# Patient Record
Sex: Male | Born: 1985 | Race: White | Hispanic: No | Marital: Single | Smoking: Former smoker
Health system: Southern US, Community
[De-identification: ages and names within clinical notes are randomized; demographics above are authoritative.]

## PROBLEM LIST (undated history)

## (undated) DIAGNOSIS — F32A Depression, unspecified: Secondary | ICD-10-CM

## (undated) DIAGNOSIS — F329 Major depressive disorder, single episode, unspecified: Secondary | ICD-10-CM

## (undated) DIAGNOSIS — K7460 Unspecified cirrhosis of liver: Secondary | ICD-10-CM

## (undated) DIAGNOSIS — M869 Osteomyelitis, unspecified: Secondary | ICD-10-CM

## (undated) DIAGNOSIS — J45909 Unspecified asthma, uncomplicated: Secondary | ICD-10-CM

## (undated) DIAGNOSIS — I10 Essential (primary) hypertension: Secondary | ICD-10-CM

## (undated) DIAGNOSIS — F1010 Alcohol abuse, uncomplicated: Secondary | ICD-10-CM

## (undated) HISTORY — DX: Unspecified cirrhosis of liver: K74.60

## (undated) HISTORY — DX: Osteomyelitis, unspecified: M86.9

## (undated) HISTORY — PX: FRACTURE SURGERY: SHX138

---
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
## 2004-09-19 ENCOUNTER — Ambulatory Visit: Payer: Self-pay | Admitting: Family Medicine

## 2005-09-22 ENCOUNTER — Emergency Department (HOSPITAL_COMMUNITY): Admission: AD | Admit: 2005-09-22 | Discharge: 2005-09-22 | Payer: Self-pay | Admitting: Family Medicine

## 2007-03-28 DIAGNOSIS — J309 Allergic rhinitis, unspecified: Secondary | ICD-10-CM | POA: Insufficient documentation

## 2010-06-24 ENCOUNTER — Emergency Department (HOSPITAL_COMMUNITY)
Admission: EM | Admit: 2010-06-24 | Discharge: 2010-06-24 | Disposition: A | Discharge status: Other Acute Inpatient Hospital | Payer: Self-pay | Source: Home / Self Care | Admitting: Emergency Medicine

## 2010-06-24 ENCOUNTER — Ambulatory Visit: Payer: Self-pay | Admitting: Psychiatry

## 2010-06-24 ENCOUNTER — Inpatient Hospital Stay (HOSPITAL_COMMUNITY)
Admission: AD | Admit: 2010-06-24 | Discharge: 2010-06-28 | Payer: Self-pay | Source: Other Acute Inpatient Hospital | Attending: Psychiatry | Admitting: Psychiatry

## 2010-09-09 ENCOUNTER — Emergency Department (HOSPITAL_COMMUNITY)
Admission: EM | Admit: 2010-09-09 | Discharge: 2010-09-09 | Disposition: A | Discharge status: Home / Self Care | Payer: Managed Care, Other (non HMO) | Attending: Emergency Medicine | Admitting: Emergency Medicine

## 2010-09-09 DIAGNOSIS — H5710 Ocular pain, unspecified eye: Secondary | ICD-10-CM | POA: Insufficient documentation

## 2010-09-09 DIAGNOSIS — T1500XA Foreign body in cornea, unspecified eye, initial encounter: Secondary | ICD-10-CM | POA: Insufficient documentation

## 2010-09-09 DIAGNOSIS — J45909 Unspecified asthma, uncomplicated: Secondary | ICD-10-CM | POA: Insufficient documentation

## 2010-09-09 DIAGNOSIS — T1590XA Foreign body on external eye, part unspecified, unspecified eye, initial encounter: Secondary | ICD-10-CM | POA: Insufficient documentation

## 2010-10-03 LAB — DIFFERENTIAL
Basophils Absolute: 0 10*3/uL (ref 0.0–0.1)
Basophils Relative: 0 % (ref 0–1)
Eosinophils Absolute: 0.1 10*3/uL (ref 0.0–0.7)
Eosinophils Relative: 1 % (ref 0–5)
Lymphocytes Relative: 31 % (ref 12–46)
Lymphs Abs: 2.5 10*3/uL (ref 0.7–4.0)
Monocytes Absolute: 0.4 10*3/uL (ref 0.1–1.0)
Monocytes Relative: 5 % (ref 3–12)
Neutro Abs: 5.1 10*3/uL (ref 1.7–7.7)
Neutrophils Relative %: 63 % (ref 43–77)

## 2010-10-03 LAB — RAPID URINE DRUG SCREEN, HOSP PERFORMED
Amphetamines: NOT DETECTED
Benzodiazepines: NOT DETECTED
Tetrahydrocannabinol: NOT DETECTED

## 2010-10-03 LAB — COMPREHENSIVE METABOLIC PANEL
ALT: 39 U/L (ref 0–53)
AST: 31 U/L (ref 0–37)
Albumin: 4.5 g/dL (ref 3.5–5.2)
Alkaline Phosphatase: 81 U/L (ref 39–117)
BUN: 8 mg/dL (ref 6–23)
CO2: 27 mEq/L (ref 19–32)
Calcium: 9.6 mg/dL (ref 8.4–10.5)
Chloride: 104 mEq/L (ref 96–112)
Creatinine, Ser: 0.9 mg/dL (ref 0.4–1.5)
GFR calc Af Amer: >60 mL/min (ref >60)
GFR calc non Af Amer: >60 mL/min (ref >60)
Glucose, Bld: 131 mg/dL — ABNORMAL HIGH (ref 70–99)
Potassium: 3.9 mEq/L (ref 3.5–5.1)
Sodium: 140 mEq/L (ref 135–145)
Total Bilirubin: 0.3 mg/dL (ref 0.3–1.2)
Total Protein: 8.2 g/dL (ref 6.0–8.3)

## 2010-10-03 LAB — CBC
HCT: 46.5 % (ref 39.0–52.0)
Hemoglobin: 16.6 g/dL (ref 13.0–17.0)
MCH: 31.3 pg (ref 26.0–34.0)
MCHC: 35.7 g/dL (ref 30.0–36.0)
MCV: 87.6 fL (ref 78.0–100.0)
Platelets: 221 10*3/uL (ref 150–400)
RBC: 5.31 MIL/uL (ref 4.22–5.81)
RDW: 11.8 % (ref 11.5–15.5)
WBC: 8.2 10*3/uL (ref 4.0–10.5)

## 2010-10-03 LAB — ETHANOL: Alcohol, Ethyl (B): 257 mg/dL — ABNORMAL HIGH (ref 0–10)

## 2011-07-18 ENCOUNTER — Encounter: Payer: Self-pay | Admitting: Emergency Medicine

## 2011-07-18 ENCOUNTER — Inpatient Hospital Stay (HOSPITAL_COMMUNITY)
Admission: AD | Admit: 2011-07-18 | Discharge: 2011-07-20 | DRG: 897 | Disposition: A | Discharge status: Home / Self Care | Payer: Managed Care, Other (non HMO) | Source: Ambulatory Visit | Attending: Psychiatry | Admitting: Psychiatry

## 2011-07-18 ENCOUNTER — Emergency Department (HOSPITAL_COMMUNITY)
Admission: EM | Admit: 2011-07-18 | Discharge: 2011-07-18 | Disposition: A | Discharge status: Other Acute Inpatient Hospital | Payer: Managed Care, Other (non HMO) | Attending: Emergency Medicine | Admitting: Emergency Medicine

## 2011-07-18 ENCOUNTER — Encounter (HOSPITAL_COMMUNITY): Payer: Self-pay | Admitting: Psychiatry

## 2011-07-18 DIAGNOSIS — F1910 Other psychoactive substance abuse, uncomplicated: Secondary | ICD-10-CM | POA: Insufficient documentation

## 2011-07-18 DIAGNOSIS — F1010 Alcohol abuse, uncomplicated: Secondary | ICD-10-CM | POA: Insufficient documentation

## 2011-07-18 DIAGNOSIS — R000 Tachycardia, unspecified: Secondary | ICD-10-CM | POA: Insufficient documentation

## 2011-07-18 DIAGNOSIS — F3289 Other specified depressive episodes: Secondary | ICD-10-CM

## 2011-07-18 DIAGNOSIS — F1020 Alcohol dependence, uncomplicated: Principal | ICD-10-CM

## 2011-07-18 DIAGNOSIS — F329 Major depressive disorder, single episode, unspecified: Secondary | ICD-10-CM

## 2011-07-18 DIAGNOSIS — Z79899 Other long term (current) drug therapy: Secondary | ICD-10-CM

## 2011-07-18 HISTORY — DX: Depression, unspecified: F32.A

## 2011-07-18 HISTORY — DX: Alcohol abuse, uncomplicated: F10.10

## 2011-07-18 HISTORY — DX: Major depressive disorder, single episode, unspecified: F32.9

## 2011-07-18 LAB — ETHANOL: Alcohol, Ethyl (B): 254 mg/dL — ABNORMAL HIGH (ref 0–11)

## 2011-07-18 LAB — RAPID URINE DRUG SCREEN, HOSP PERFORMED
Amphetamines: NOT DETECTED
Benzodiazepines: NOT DETECTED
Opiates: NOT DETECTED

## 2011-07-18 LAB — DIFFERENTIAL
Lymphocytes Relative: 37 % (ref 12–46)
Lymphs Abs: 2.3 10*3/uL (ref 0.7–4.0)
Neutro Abs: 3.3 10*3/uL (ref 1.7–7.7)
Neutrophils Relative %: 54 % (ref 43–77)

## 2011-07-18 LAB — BASIC METABOLIC PANEL
CO2: 30 mEq/L (ref 19–32)
Glucose, Bld: 117 mg/dL — ABNORMAL HIGH (ref 70–99)
Potassium: 4.5 mEq/L (ref 3.5–5.1)
Sodium: 138 mEq/L (ref 135–145)

## 2011-07-18 LAB — URINALYSIS, ROUTINE W REFLEX MICROSCOPIC
Glucose, UA: NEGATIVE mg/dL
Leukocytes, UA: NEGATIVE
Nitrite: NEGATIVE
Protein, ur: 30 mg/dL — AB
pH: 7 (ref 5.0–8.0)

## 2011-07-18 LAB — URINE MICROSCOPIC-ADD ON

## 2011-07-18 LAB — CBC
MCV: 86.2 fL (ref 78.0–100.0)
Platelets: 211 10*3/uL (ref 150–400)
RBC: 5.36 MIL/uL (ref 4.22–5.81)
WBC: 6.2 10*3/uL (ref 4.0–10.5)

## 2011-07-18 MED ORDER — ACETAMINOPHEN 325 MG PO TABS: 650.0000 mg | ORAL_TABLET | ORAL | Status: DC | PRN

## 2011-07-18 MED ORDER — IBUPROFEN 600 MG PO TABS: 600.0000 mg | ORAL_TABLET | Freq: Three times a day (TID) | ORAL | Status: DC | PRN

## 2011-07-18 MED ORDER — CHLORDIAZEPOXIDE HCL 25 MG PO CAPS
25.0000 mg | ORAL_CAPSULE | ORAL | Status: DC
  Administered 2011-07-20: 25 mg via ORAL
  Filled 2011-07-18: qty 1

## 2011-07-18 MED ORDER — ONDANSETRON 4 MG PO TBDP: 4.0000 mg | ORAL_TABLET | Freq: Four times a day (QID) | ORAL | Status: DC | PRN

## 2011-07-18 MED ORDER — ALUM & MAG HYDROXIDE-SIMETH 200-200-20 MG/5ML PO SUSP: 30.0000 mL | ORAL | Status: DC | PRN

## 2011-07-18 MED ORDER — ADULT MULTIVITAMIN W/MINERALS CH
1.0000 | ORAL_TABLET | Freq: Every day | ORAL | Status: DC
  Administered 2011-07-18 – 2011-07-20 (×4): 1 via ORAL
  Filled 2011-07-18 (×3): qty 1

## 2011-07-18 MED ORDER — HYDROXYZINE HCL 25 MG PO TABS
25.0000 mg | ORAL_TABLET | Freq: Four times a day (QID) | ORAL | Status: DC | PRN
  Administered 2011-07-18: 25 mg via ORAL

## 2011-07-18 MED ORDER — LOPERAMIDE HCL 2 MG PO CAPS: 2.0000 mg | ORAL_CAPSULE | ORAL | Status: DC | PRN

## 2011-07-18 MED ORDER — ONDANSETRON HCL 4 MG PO TABS: 4.0000 mg | ORAL_TABLET | Freq: Three times a day (TID) | ORAL | Status: DC | PRN

## 2011-07-18 MED ORDER — MAGNESIUM HYDROXIDE 400 MG/5ML PO SUSP: 30.0000 mL | Freq: Every day | ORAL | Status: DC | PRN

## 2011-07-18 MED ORDER — LORAZEPAM 1 MG PO TABS: 1.0000 mg | ORAL_TABLET | ORAL | Status: DC | PRN

## 2011-07-18 MED ORDER — CHLORDIAZEPOXIDE HCL 25 MG PO CAPS
25.0000 mg | ORAL_CAPSULE | Freq: Four times a day (QID) | ORAL | Status: AC
  Administered 2011-07-18 (×2): 25 mg via ORAL
  Filled 2011-07-18 (×2): qty 1

## 2011-07-18 MED ORDER — ZOLPIDEM TARTRATE 5 MG PO TABS: 5.0000 mg | ORAL_TABLET | Freq: Every evening | ORAL | Status: DC | PRN

## 2011-07-18 MED ORDER — CHLORDIAZEPOXIDE HCL 25 MG PO CAPS: 25.0000 mg | ORAL_CAPSULE | Freq: Four times a day (QID) | ORAL | Status: DC | PRN

## 2011-07-18 MED ORDER — LORAZEPAM 1 MG PO TABS
1.0000 mg | ORAL_TABLET | Freq: Once | ORAL | Status: AC
  Administered 2011-07-18: 1 mg via ORAL
  Filled 2011-07-18: qty 1

## 2011-07-18 MED ORDER — CHLORDIAZEPOXIDE HCL 25 MG PO CAPS: 25.0000 mg | ORAL_CAPSULE | Freq: Every day | ORAL | Status: DC

## 2011-07-18 MED ORDER — CHLORDIAZEPOXIDE HCL 25 MG PO CAPS
25.0000 mg | ORAL_CAPSULE | Freq: Three times a day (TID) | ORAL | Status: AC
  Administered 2011-07-19 (×3): 25 mg via ORAL
  Filled 2011-07-18 (×3): qty 1

## 2011-07-18 MED ORDER — VITAMIN B-1 100 MG PO TABS
100.0000 mg | ORAL_TABLET | Freq: Every day | ORAL | Status: DC
  Administered 2011-07-19 – 2011-07-20 (×2): 100 mg via ORAL
  Filled 2011-07-18 (×4): qty 1

## 2011-07-18 MED ORDER — ACETAMINOPHEN 325 MG PO TABS
650.0000 mg | ORAL_TABLET | Freq: Four times a day (QID) | ORAL | Status: DC | PRN
  Administered 2011-07-19 – 2011-07-20 (×2): 650 mg via ORAL

## 2011-07-18 NOTE — ED Provider Notes (Addendum)
Pt resting comfortably. No tremor or shakes. Awaiting [facility] bed.   Act team indicates pt accepted to [facility], dr Rogers Blocker, bed ready. Pt resting comfortably. Hr 94 rr 16.   Suzi Roots, MD 07/18/11 [id number]  Suzi Roots, MD 07/18/11 [phone]

## 2011-07-18 NOTE — H&P (Signed)
Psychiatric Admission Assessment Adult  Patient Identification:  [name] Xxx-Hairfield Date of Evaluation:  07/18/2011 Chief Complaint:  ETOH DEP History of Present Illness:Pt. Is requesting detox from alcohol. Mood Symptoms:  None Depression Symptoms:  None (Hypo) Manic Symptoms:   Elevated Mood:  No Irritable Mood:  No Grandiosity:  No Distractibility:  No Labiality of Mood:  No Delusions:  No Hallucinations:  No Impulsivity:  No Sexually Inappropriate Behavior:  No Financial Extravagance:  No Flight of Ideas:  No  Anxiety Symptoms: Excessive Worry:  No Panic Symptoms:  No Agoraphobia:  No Obsessive Compulsive: No  Symptoms: None Specific Phobias:  No Social Anxiety:  No  Psychotic Symptoms:  Hallucinations:  None Delusions:  No Paranoia:  No   Ideas of Reference:  No  PTSD Symptoms: Ever had a traumatic exposure:  No Had a traumatic exposure in the last month:  No Re-experiencing:  None Hypervigilance:  No Hyperarousal:  None Avoidance:  None  Traumatic Brain Injury:  None per patient  Past Psychiatric History: 1 previous [facility] admission for detox Diagnosis: alcohol  abuse  Hospitalizations: see above  Outpatient Care: none  Substance Abuse Care: none  Self-Mutilation: none  Suicidal Attempts:none  Violent Behaviors:none   Past Medical History:   Past Medical History  Diagnosis Date  . Alcohol abuse   . Depression    History of Loss of Consciousness:  No Seizure History:  No Cardiac History:  No Allergies:  No Known Allergies Current Medications:  Current Facility-Administered Medications  Medication Dose Route Frequency Provider Last Rate Last Dose  . acetaminophen (TYLENOL) tablet 650 mg  650 mg Oral Q6H PRN Shamsher Alvie Heidelberg, MD      . alum & mag hydroxide-simeth (MAALOX/MYLANTA) 200-200-20 MG/5ML suspension 30 mL  30 mL Oral Q4H PRN Shamsher Alvie Heidelberg, MD      . chlordiazePOXIDE (LIBRIUM) capsule 25 mg  25 mg Oral Q6H PRN Shamsher Alvie Heidelberg, MD      . chlordiazePOXIDE (LIBRIUM) capsule 25 mg  25 mg Oral QID Katheren Puller, MD       Followed by  . chlordiazePOXIDE (LIBRIUM) capsule 25 mg  25 mg Oral TID Katheren Puller, MD       Followed by  . chlordiazePOXIDE (LIBRIUM) capsule 25 mg  25 mg Oral BH-qamhs Shamsher Alvie Heidelberg, MD       Followed by  . chlordiazePOXIDE (LIBRIUM) capsule 25 mg  25 mg Oral Daily Shamsher Alvie Heidelberg, MD      . hydrOXYzine (ATARAX/VISTARIL) tablet 25 mg  25 mg Oral Q6H PRN Shamsher Alvie Heidelberg, MD      . loperamide (IMODIUM) capsule 2-4 mg  2-4 mg Oral PRN Shamsher Alvie Heidelberg, MD      . magnesium hydroxide (MILK OF MAGNESIA) suspension 30 mL  30 mL Oral Daily PRN Shamsher Alvie Heidelberg, MD      . mulitivitamin with minerals tablet 1 tablet  1 tablet Oral Daily Shamsher S Ahluwalia, MD      . ondansetron (ZOFRAN-ODT) disintegrating tablet 4 mg  4 mg Oral Q6H PRN Shamsher Alvie Heidelberg, MD      . thiamine (VITAMIN B-1) tablet 100 mg  100 mg Oral Daily Shamsher Alvie Heidelberg, MD       Facility-Administered Medications Ordered in Other Encounters  Medication Dose Route Frequency Provider Last Rate Last Dose  . LORazepam (ATIVAN) tablet 1 mg  1 mg Oral Once Dione Booze, MD   1 mg at 07/18/11 0405  .  DISCONTD: acetaminophen (TYLENOL) tablet 650 mg  650 mg Oral Q4H PRN Dione Booze, MD      . DISCONTD: alum & mag hydroxide-simeth (MAALOX/MYLANTA) 200-200-20 MG/5ML suspension 30 mL  30 mL Oral PRN Dione Booze, MD      . DISCONTD: ibuprofen (ADVIL,MOTRIN) tablet 600 mg  600 mg Oral Q8H PRN Dione Booze, MD      . DISCONTD: LORazepam (ATIVAN) tablet 1 mg  1 mg Oral Q4H PRN Dione Booze, MD      . DISCONTD: ondansetron [facility]) tablet 4 mg  4 mg Oral Q8H PRN Dione Booze, MD      . DISCONTD: zolpidem (AMBIEN) tablet 5 mg  5 mg Oral QHS PRN Dione Booze, MD        Previous Psychotropic Medications:  Medication Dose                        Substance Abuse History in the last 12  months: Substance Age of 1st Use Last Use Amount Specific Type  Nicotine      Alcohol      Cannabis      Opiates      Cocaine      Methamphetamines      LSD      Ecstasy      Benzodiazepines      Caffeine      Inhalants      Others:                         Medical Consequences of Substance Abuse:  Legal Consequences of Substance Abuse:  Family Consequences of Substance Abuse:  Blackouts:  No DT's:  No Withdrawal Symptoms:  Headaches Nausea  Social History: Current Place of Residence:   Place of Birth:   Family Members: Marital Status:  Single Children:  Sons:  Daughters: Relationships: Education:  Corporate treasurer Problems/Performance: Religious Beliefs/Practices: History of Abuse (Emotional/Phsycial/Sexual) Occupational Experiences; Military History:   Legal History: Hobbies/Interests:  Family History:  History reviewed. No pertinent family history. ROS: Negative  PE: Done in ED and reviewed by this examiner. Mental Status Examination/Evaluation: Objective:  Appearance: Fairly Groomed  Patent attorney::  Good  Speech:  Clear and Coherent  Volume:  Normal  Mood:    Affect:  Congruent  Thought Process:  Linear  Orientation:  Full  Thought Content:  Hallucinations: None  Suicidal Thoughts:  No  Homicidal Thoughts:  No  Judgement:  Impaired  Insight:  Fair  Psychomotor Activity:  Normal  Akathisia:  No  Handed:  Right  AIMS (if indicated):     Assets:  Communication Skills Desire for Improvement Transportation    Laboratory/X-Ray Psychological Evaluation(s)      Assessment:  Axis I: Alcohol Abuse  AXIS I Alcohol Abuse  AXIS II No diagnosis  AXIS III Past Medical History  Diagnosis Date  . Alcohol abuse   . Depression      AXIS IV   AXIS V 51-60 moderate symptoms   Treatment Plan/Recommendations:  Treatment Plan Summary: Daily contact with patient to assess and evaluate symptoms and progress in treatment Medication  management  Observation Level/Precautions:  Detox  Laboratory:    Psychotherapy:    Medications:    Routine PRN Medications:  Yes  Consultations:    Discharge Concerns:    Other:      [name] 12/26/20123:56 PM

## 2011-07-18 NOTE — Progress Notes (Signed)
[facility] Group Notes:  (Counselor/Nursing/MHT/Case Management/Adjunct)    07/18/2011 2:16 PM   Type of Therapy:  Counseling Group at 1:15 pm  Participation Level:  Minimal  Participation Quality:  Appropriate  Affect:  Depressed  Cognitive:  Oriented  Insight:  Unknown  Engagement in Group:  None evident  Engagement in Therapy:   none evident   Modes of Intervention:   exploration and activity   Summary of Progress/Problems: Adrick attended his first processing group of admission, reports he is here for detox. Patient seemed somewhat attentive, chose not to participate in activity.   Ronda Fairly, LCSWA 07/18/2011 2:16 PM

## 2011-07-18 NOTE — Progress Notes (Signed)
Patient ID: [name], male   DOB: 1985-08-18, 25 y.o.   MRN: [id number] Pt denies SI on admission but has had some passive SI with no plan.  Denies HI/AVH and physical pain.  [name] has no medical issues and does not take any medications.  NKDA.  BP & P elevated and primary RN notified--regarding CIWA orders.  He has been here for rehab one other time last year, this is his 2nd inpatient admission.  His support system includes his mother, girlfriend, and children.  He is employed without financial issues.  Last drink was about 9 pm last night.

## 2011-07-18 NOTE — Tx Team (Signed)
Initial Interdisciplinary Treatment Plan  PATIENT STRENGTHS: (choose at least two) Ability for insight Average or above average intelligence Capable of independent living Communication skills Financial means General fund of knowledge Supportive family/friends Work skills  PATIENT STRESSORS: Marital or family conflict Substance abuse   PROBLEM LIST: Problem List/Patient Goals Date to be addressed Date deferred Reason deferred Estimated date of resolution  Depression      Alcohol Abuse                                                 DISCHARGE CRITERIA:  Ability to meet basic life and health needs Improved stabilization in mood, thinking, and/or behavior Motivation to continue treatment in a less acute level of care Reduction of life-threatening or endangering symptoms to within safe limits Verbal commitment to aftercare and medication compliance Withdrawal symptoms are absent or subacute and managed without 24-hour nursing intervention  PRELIMINARY DISCHARGE PLAN: Attend 12-step recovery group Outpatient therapy Return to previous living arrangement  PATIENT/FAMIILY INVOLVEMENT: This treatment plan has been presented to and reviewed with the patient, [name], and/or family member.  The patient and family have been given the opportunity to ask questions and make suggestions.  [name] 07/18/2011, 8:02 PM

## 2011-07-18 NOTE — ED Notes (Signed)
MD at bedside. 

## 2011-07-18 NOTE — BH Assessment (Signed)
Assessment Note   [name] Xxx-Quint is a 25 y.o. male who presents [facility] for [facility].  Pt admits drinking 12-18 beers, last consumed 07/18/11 at 4am.  Pt does not reports any other specific stressors that have caused heavy drinking other than a fight with girlfriend.  Pt is drowsy, lethargic and reports slight tremors.  Pt.'s last inpt detox was with [facility] in 2011, no other facilities for detox.  Pt has family hx of SA(various family members) and is req help with alcohol abuse.  This Clinical research associate discuss assessment with Dr. Rogers Blocker and he has been accepted to [facility] for tx.  Pt has possible bed assignment 303-1, Dr. Catha Brow and cannot be transported until after 9am.    Axis I: Alcohol Abuse Axis II: Deferred Axis III:  Past Medical History  Diagnosis Date  . Alcohol abuse   . Depression    Axis IV: other psychosocial or environmental problems, problems related to social environment and problems with primary support group Axis V: 41-50 serious symptoms  Past Medical History:  Past Medical History  Diagnosis Date  . Alcohol abuse   . Depression     History reviewed. No pertinent past surgical history.  Family History: History reviewed. No pertinent family history.  Social History:  reports that he has never smoked. He does not have any smokeless tobacco history on file. He reports that he drinks alcohol. He reports that he does not use illicit drugs.  Additional Social History:  Alcohol / Drug Use Pain Medications: None  Prescriptions: None  Over the Counter: None  History of alcohol / drug use?: Yes Substance #1 Name of Substance 1: Alcohol--BEER 1 - Age of First Use: 14 YOM 1 - Amount (size/oz): 12-18 Beers 1 - Frequency: Daily  1 - Duration: On-going  1 - Last Use / Amount: 07/18/11 Allergies: No Known Allergies  Home Medications:  Medications Prior to Admission  Medication Dose Route Frequency Provider Last Rate Last Dose  . acetaminophen (TYLENOL) tablet 650 mg  650 mg Oral  Q4H PRN Dione Booze, MD      . alum & mag hydroxide-simeth (MAALOX/MYLANTA) 200-200-20 MG/5ML suspension 30 mL  30 mL Oral PRN Dione Booze, MD      . ibuprofen (ADVIL,MOTRIN) tablet 600 mg  600 mg Oral Q8H PRN Dione Booze, MD      . LORazepam (ATIVAN) tablet 1 mg  1 mg Oral Once Dione Booze, MD   1 mg at 07/18/11 0405  . LORazepam (ATIVAN) tablet 1 mg  1 mg Oral Q4H PRN Dione Booze, MD      . ondansetron [facility]) tablet 4 mg  4 mg Oral Q8H PRN Dione Booze, MD      . zolpidem [facility]) tablet 5 mg  5 mg Oral QHS PRN Dione Booze, MD       No current outpatient prescriptions on file as of 07/18/2011.    OB/GYN Status:  No LMP for male patient.  General Assessment Data Location of Assessment: [facility] ED Living Arrangements: Alone Can pt return to current living arrangement?: Yes Admission Status: Voluntary Is patient capable of signing voluntary admission?: Yes Transfer from: Acute Hospital Referral Source: MD  Education Status Is patient currently in school?: No Current Grade: None Highest grade of school patient has completed: Unk  Name of school: None  Contact person: None   Risk to self Suicidal Ideation: No Suicidal Intent: No Is patient at risk for suicide?: No Suicidal Plan?: No Access to Means: No What has  been your use of drugs/alcohol within the last 12 months?: Alcohol use since age 65 Previous Attempts/Gestures: No How many times?: 0  Other Self Harm Risks: None  Triggers for Past Attempts: None known Intentional Self Injurious Behavior: None Family Suicide History: No Recent stressful life event(s): Other (Comment) (None ) Persecutory voices/beliefs?: No Depression: Yes Depression Symptoms: Loss of interest in usual pleasures Substance abuse history and/or treatment for substance abuse?: Yes Suicide prevention information given to non-admitted patients: Not applicable  Risk to Others Homicidal Ideation: No Thoughts of Harm to Others: No Current Homicidal Intent:  No Current Homicidal Plan: No Access to Homicidal Means: No Identified Victim: None  History of harm to others?: No Assessment of Violence: None Noted Violent Behavior Description: None  Does patient have access to weapons?: No Criminal Charges Pending?: No Does patient have a court date: No  Psychosis Hallucinations: None noted Delusions: None noted  Mental Status Report Appear/Hygiene: Disheveled Eye Contact: Fair Motor Activity: Unremarkable Speech: Logical/coherent Level of Consciousness: Drowsy Mood: Depressed Affect: Depressed Anxiety Level: None Thought Processes: Coherent;Relevant Judgement: Unimpaired Orientation: Person;Place;Time;Situation Obsessive Compulsive Thoughts/Behaviors: None  Cognitive Functioning Concentration: Normal Memory: Recent Intact;Remote Intact IQ: Average Insight: Good Impulse Control: Good Appetite: Good Weight Loss: 0  Weight Gain: 0  Sleep: No Change Total Hours of Sleep: 8  Vegetative Symptoms: None  Prior Inpatient Therapy Prior Inpatient Therapy: Yes Prior Therapy Dates: 2011 Prior Therapy Facilty/Provider(s): [facility] Reason for Treatment: Detox   Prior Outpatient Therapy Prior Outpatient Therapy: No Prior Therapy Dates: None  Prior Therapy Facilty/Provider(s): None  Reason for Treatment: None   ADL Screening (condition at time of admission) Patient's cognitive ability adequate to safely complete daily activities?: Yes Patient able to express need for assistance with ADLs?: Yes Independently performs ADLs?: Yes Weakness of Legs: None Weakness of Arms/Hands: None       Abuse/Neglect Assessment (Assessment to be complete while patient is alone) Physical Abuse: Denies Verbal Abuse: Denies Sexual Abuse: Denies Exploitation of patient/patient's resources: Denies Self-Neglect: Denies Values / Beliefs Cultural Requests During Hospitalization: None Spiritual Requests During Hospitalization: None Consults Spiritual  Care Consult Needed: No Social Work Consult Needed: No Merchant navy officer (For Healthcare) Advance Directive: Patient does not have advance directive;Patient would not like information Pre-existing out of facility DNR order (yellow form or pink MOST form): No    Additional Information 1:1 In Past 12 Months?: No CIRT Risk: No Elopement Risk: No Does patient have medical clearance?: Yes     Disposition:  Disposition Disposition of Patient: Inpatient treatment program;Referred to [facility] ) Type of inpatient treatment program: Adult Patient referred to: Other (Comment) [facility] )  On Site Evaluation by:   Reviewed with Physician:     Murrell Redden 07/18/2011 6:12 AM

## 2011-07-18 NOTE — ED Provider Notes (Signed)
History     CSN: [id number]  Arrival date & time 07/18/11  0130   First MD Initiated Contact with Patient 07/18/11 0425      Chief Complaint  Patient presents with  . Alcohol Problem    Detox   . Drug / Alcohol Assessment    (Consider location/radiation/quality/duration/timing/severity/associated sxs/prior treatment) Patient is a 25 y.o. male presenting with alcohol problem and drug/alcohol assessment. The history is provided by the patient.  Alcohol Problem  Drug / Alcohol Assessment  He is a heavy drinker states he will drink 12-18 beers a day. He wishes both were programmed to stop drinking. He had done this one year ago but resumed drinking. He denies depression, suicidal ideation, homicidal ideation. He denies other drug use.  Past Medical History  Diagnosis Date  . Alcohol abuse     History reviewed. No pertinent past surgical history.  History reviewed. No pertinent family history.  History  Substance Use Topics  . Smoking status: Never Smoker   . Smokeless tobacco: Not on file  . Alcohol Use: Yes     heavy drinker      Review of Systems  All other systems reviewed and are negative.    Allergies  Review of patient's allergies indicates no known allergies.  Home Medications  No current outpatient prescriptions on file.  BP 134/95  Pulse 100  Temp(Src) 99.2 F (37.3 C) (Oral)  Resp 16  SpO2 99%  Physical Exam  Nursing note and vitals reviewed.  25 year old male who is resting comfortably and in no acute distress. Vital signs are significant for tachycardia with heart rate 136 and hypertension with blood pressure 151/101. Oxygen saturation is 100% which is normal. Head is normocephalic and atraumatic. PERRLA, EOMI. Neck is supple without adenopathy. Lungs are clear without rales, wheezes, rhonchi. Heart has regular rate rhythm without murmur. Abdomen is soft, flat, nontender without masses or hepatosplenomegaly and peristalsis is present.  Extremities have no cyanosis or edema, full range of motion present. Skin is warm and moist without rash. Neurologic: Mental status is normal, cranial nerves are intact, there no focal motor or sensory deficits.  ED Course  Procedures (including critical care time)  Labs Reviewed  CBC - Abnormal; Notable for the following:    MCHC 36.1 (*)    All other components within normal limits  DIFFERENTIAL  BASIC METABOLIC PANEL  ETHANOL  URINALYSIS, ROUTINE W REFLEX MICROSCOPIC  URINE RAPID DRUG SCREEN (HOSP PERFORMED)   No results found. Results for orders placed during the hospital encounter of 07/18/11  CBC      Component Value Range   WBC 6.2  4.0 - 10.5 (K/uL)   RBC 5.36  4.22 - 5.81 (MIL/uL)   Hemoglobin 16.7  13.0 - 17.0 (g/dL)   HCT 33.2  95.1 - 88.4 (%)   MCV 86.2  78.0 - 100.0 (fL)   MCH 31.2  26.0 - 34.0 (pg)   MCHC 36.1 (*) 30.0 - 36.0 (g/dL)   RDW 16.6  06.3 - 01.6 (%)   Platelets 211  150 - 400 (K/uL)  DIFFERENTIAL      Component Value Range   Neutrophils Relative 54  43 - 77 (%)   Neutro Abs 3.3  1.7 - 7.7 (K/uL)   Lymphocytes Relative 37  12 - 46 (%)   Lymphs Abs 2.3  0.7 - 4.0 (K/uL)   Monocytes Relative 8  3 - 12 (%)   Monocytes Absolute 0.5  0.1 - 1.0 (K/uL)  Eosinophils Relative 0  0 - 5 (%)   Eosinophils Absolute 0.0  0.0 - 0.7 (K/uL)   Basophils Relative 0  0 - 1 (%)   Basophils Absolute 0.0  0.0 - 0.1 (K/uL)  BASIC METABOLIC PANEL      Component Value Range   Sodium 138  135 - 145 (mEq/L)   Potassium 4.5  3.5 - 5.1 (mEq/L)   Chloride 97  96 - 112 (mEq/L)   CO2 30  19 - 32 (mEq/L)   Glucose, Bld 117 (*) 70 - 99 (mg/dL)   BUN 9  6 - 23 (mg/dL)   Creatinine, Ser 1.61  0.50 - 1.35 (mg/dL)   Calcium 09.6  8.4 - 10.5 (mg/dL)   GFR calc non Af Amer >90  >90 (mL/min)   GFR calc Af Amer >90  >90 (mL/min)  ETHANOL      Component Value Range   Alcohol, Ethyl (B) 254 (*) 0 - 11 (mg/dL)  URINALYSIS, ROUTINE W REFLEX MICROSCOPIC      Component Value Range    Color, Urine YELLOW  YELLOW    APPearance CLEAR  CLEAR    Specific Gravity, Urine 1.012  1.005 - 1.030    pH 7.0  5.0 - 8.0    Glucose, UA NEGATIVE  NEGATIVE (mg/dL)   Hgb urine dipstick NEGATIVE  NEGATIVE    Bilirubin Urine NEGATIVE  NEGATIVE    Ketones, ur NEGATIVE  NEGATIVE (mg/dL)   Protein, ur 30 (*) NEGATIVE (mg/dL)   Urobilinogen, UA 0.2  0.0 - 1.0 (mg/dL)   Nitrite NEGATIVE  NEGATIVE    Leukocytes, UA NEGATIVE  NEGATIVE   URINE RAPID DRUG SCREEN (HOSP PERFORMED)      Component Value Range   Opiates NONE DETECTED  NONE DETECTED    Cocaine NONE DETECTED  NONE DETECTED    Benzodiazepines NONE DETECTED  NONE DETECTED    Amphetamines NONE DETECTED  NONE DETECTED    Tetrahydrocannabinol NONE DETECTED  NONE DETECTED    Barbiturates NONE DETECTED  NONE DETECTED   URINE MICROSCOPIC-ADD ON      Component Value Range   Squamous Epithelial / LPF RARE  RARE    Bacteria, UA RARE  RARE    No results found.    No diagnosis found. He is admitted to the psychiatric emergency department for evaluation for possible referral to an alcohol detox program.  MDM  Alcohol abuse.        Dione Booze, MD 07/19/11 [phone]

## 2011-07-18 NOTE — ED Notes (Signed)
Pt presented to the ER with c/o alcohol intoxication, pt states had a fight/verbal argument/disgreement with a girlfriend, there after pt called [facility], who brought the pt to the ER, pt at this time not sure if he wants to go for detox, however, wants help. Pt voluntary commitment at this time.

## 2011-07-18 NOTE — Progress Notes (Signed)
Suicide Risk Assessment  Admission Assessment     Demographic factors:  Assessment Details Time of Assessment: Admission Information Obtained From: Patient Current Mental Status:  Current Mental Status:  (none) Loss Factors:    Historical Factors:    Risk Reduction Factors:  Risk Reduction Factors: Employed;Responsible for children under 27 years of age;Living with another person, especially a relative;Positive social support  CLINICAL FACTORS:   Alcohol/Substance Abuse/Dependencies  COGNITIVE FEATURES THAT CONTRIBUTE TO RISK:  No Cognitive risk factors noted.   SUICIDE RISK:   Minimal: No identifiable suicidal ideation.  Patients presenting with no risk factors but with morbid ruminations; may be classified as minimal risk based on the severity of the depressive symptoms  Reason for admission: .  Mental Status Examination and Plan: Patient denies suicidal or homicidal ideation, hallucinations, illusions, or delusions. Patient engages with good eye contact, is able to focus adequately in a one to one setting, and has clear goal directed thoughts. Patient speaks with a natural conversational volume, rate, and tone. Anxiety was reported at 5 on a scale of 1 the least and 10 the most. Depression was reported at 5 on the same scale. Patient is oriented times 4, recent and remote memory intact. Judgement: able to see that he can't stop drinking on his own Insight: intact Plan:  We will admit the patient for crisis stabilization and treatment.  We will continue on q. 15 checks the unit protocol. At this time there is no clinical indication for one-to-one observation as patient contract for safety and presents little risk to harm themself and others.  We will increase collateral information. I encourage patient to participate in group milieu therapy. Please see history and physical note for more detailed information ELOS: 3 to 5 days.   [name] 07/18/2011, 5:47 PM

## 2011-07-18 NOTE — Consult Note (Signed)
Patient Identification:  [name] Date of Evaluation:  07/18/2011   History of Present Illness:  25 year old male came for alcohol detox. Patient is logical and goal-directed during the interview is not suicidal or homicidal not hallucinating or delusional. Patient is alert awake oriented x3 no delirium tremens symptoms present but patient has tremors in both hands.  I also reviewed his last discharge summary as below. PHYSICIAN: Marlis Edelson, DO DATE OF BIRTH: 01-Jul-1986  DATE OF ADMISSION: 06/24/2010  DATE OF DISCHARGE: 06/28/2010  DISCHARGE SUMMARY  CHIEF COMPLAINT: Alcohol abuse.  HISTORY OF THE CHIEF COMPLAINT: [name] was a very pleasant 25 year old Caucasian male who presented to the [facility] for admission for alcohol detox due to the increased use of alcohol  drinking as much as 18 beers per day. He related no other significant psychiatric history. No previous rehabilitation. No prior AA involvement. He had never been in any treatment programs or has never been diagnosed with psychiatric illnesses prior to this admission. He desired detoxification from his alcohol. He was therefore admitted, and  placed on the Adult Unit as will be outlined below.  PAST PSYCHIATRIC HISTORY: As above.  HOSPITAL COURSE: [name] was admitted to the Adult Unit where he  was integrated into the adult milieu. He was placed on a Librium detox  protocol. He stated by December 5 that he was feeling much better. He  had some mild cravings, but not as bad. He denied perceptual symptoms  or tremors. His blood pressure was initially 145/95, but decreased  later in the day to 134/80. He was afebrile. Pulse was 88. He had no  suicidal or homicidal ideation. He remained on the detox protocol  without adverse effects. He was up and attending groups by June 27, 2010. He was pleasant and cooperative. He denied any cravings. He was  gaining insight into his alcohol use, and  wanted to attend Alcohol[facility]  [facility] upon discharge. He also anticipated going to see a counselor  at the [facility]. By the date of discharge he related, "I feel  good." He was ready to go home. He had no suicidal or homicidal ideation. No psychosis. No cravings. He was physically well with an uneventful detox. His blood pressure was 121/79 with remaining vitals being unremarkable. His mood as st ted was good.  LABORATORY/IMAGING: None.  CONSULTATIONS: None.  COMPLICATIONS: None.  PROCEDURES: None.  MENTAL STATUS EXAMINATION AT TIME OF DISCHARGE: He was pleasant,  cooperative, engaging. He was appropriately dressed and groomed. His  motor behavior was normal. Speech normal. Level of consciousness  normal. Mood: "Good." Affect: Congruent, full range. He denies  significant anxiety. Thought process: Linear, logical, goal-directed.  Thought content: Without perceptual symptoms. He denied suicidal or  homicidal thought, intent or plan. He displayed no hypomania or mania.  No perceptual symptoms and no psychosis. His insight was improved.  Judgment intact. He was cognitively intact.  ASSESSMENT: Axis I: Alcohol dependency.  Axis II: Deferred.  Axis III: None.  Axis IV: Long-term alcohol use.  Axis V: Sixty-eight.  CONDITION AT DISCHARGE: Improved with no evidence of suicidal or  homicidal ideation. No hypomania, no mania, no psychosis. No active  withdrawal symptoms. He was physically stable.  DISCHARGE INSTRUCTIONS: He is to follow up with the [facility] on  Friday, June 30, 2010 at 5 p.m. He will also attend Alcoholics  [facility] with a recommendation for 90 meetings in 90 days.  DISCHARGE MEDICATIONS:  1. He is continue  albuterol inhaler 2 puffs q.4 hours p.r.n. for  shortness of breath which has been a long-term prescription.  2. Trazodone 50mg  p.o. q h.s. as needed for sleep.  SPECIAL INSTRUCTIONS: Return to the hospital for any development of  suicidal ideation, emergent  treatment for any adverse reactions to  medications.  PROGNOSIS: Fair with continued counseling. He will need active  substance abuse treatment on a regular basis.   Past Medical History:     Past Medical History  Diagnosis Date  . Alcohol abuse   . Depression        Past Surgical History  Procedure Date  . Fracture surgery     Filed Vitals:   07/18/11 1107  BP: 137/93  Pulse: 139  Temp:   Resp:     Lab Results:   BMET    Component Value Date/Time   NA 138 07/18/2011 0412   K 4.5 07/18/2011 0412   CL 97 07/18/2011 0412   CO2 30 07/18/2011 0412   GLUCOSE 117* 07/18/2011 0412   BUN 9 07/18/2011 0412   CREATININE 0.87 07/18/2011 0412   CALCIUM 10.3 07/18/2011 0412   GFRNONAA >90 07/18/2011 0412   GFRAA >90 07/18/2011 0412    Allergies: No Known Allergies  Current Medications:  Prior to Admission medications   Not on File    Social History:    reports that he has never smoked. He does not have any smokeless tobacco history on file. He reports that he drinks alcohol. He reports that he uses illicit drugs (Marijuana).   Family History:    History reviewed. No pertinent family history.   DIAGNOSIS:   AXIS I  chronic alcohol dependence   AXIS II  Deffered  AXIS III See medical notes.  AXIS IV  alcohol abuse   AXIS V 45     Recommendations: Patient admitted for further detox started on Librium detox protocol.  Eulogio Ditch, MD

## 2011-07-19 DIAGNOSIS — F329 Major depressive disorder, single episode, unspecified: Secondary | ICD-10-CM

## 2011-07-19 NOTE — Progress Notes (Signed)
Pt is visible on the unit.  He is sitting in the dayroom watching TV.  He has been quiet and staying to himself.  He reports he feels fine and denies any withdrawal symptoms.  He has been appropriate/cooperative.  He denies pain.  Encouraged to make needs known to staff.  Safety maintained with q15 minute checks.

## 2011-07-19 NOTE — Progress Notes (Signed)
Recreation Therapy Group Note  Date: 07/19/2011         Time: 1415      Group Topic/Focus: The focus of this group is on discussing various styles of communication and communicating assertively using 'I' (feeling) statements.  Participation Level: Minimal  Participation Quality: Attentive  Affect: Depressed  Cognitive: Oriented   Additional Comments: Patient remains quiet during group, spoke a bit about his children, but said little else.   Fonnie Crookshanks 07/19/2011 2:56 PM

## 2011-07-19 NOTE — Progress Notes (Signed)
[facility] Group Notes:  (Counselor/Nursing/MHT/Case Management/Adjunct)  07/19/2011 12:41 PM   Type of Therapy:  Processing Group at 11:00 am  Participation Level:  Minimal  Participation Quality:  Attentive  Affect:  Flat  Cognitive:  Oriented  Insight:  Unknown   Engagement in Group:  Limited  Engagement in Therapy:  Limited  Modes of Intervention:  Exploration, socialization and support  Summary of Progress/Problems:  Opal was quiet but attentive. Body language portrayed interest of topic of balance in life especially during segment relating to post acute withdrawal syndrome.  [facility] Group Notes:  (Counselor/Nursing/MHT/Case Management/Adjunct)  07/19/2011 12:41 PM   Type of Therapy:  Counseling Group at 1:15 pm  Participation Level:  Appropriate  Participation Quality:  Sharing and Attentive  Affect:  Appropriate somewhat depressed  Cognitive:  Appropriate  Insight:  Minimal  Engagement in Group:  Good  Engagement in Therapy:  Minimal  Modes of Intervention:  Exploration socialization and support  Summary of Progress/Problems: Warner shared alcohol use test negative it he impacted his life and relationship areas and his interest in activities. Dequavius express interest in the 2 12-step meetings or he has attended inpatient yet is hesitant about attending meetings in the community. At the request and received in Alcoholics Anonymous.  Ronda Fairly, LCSWA 07/19/2011 12:41 PM

## 2011-07-19 NOTE — Treatment Plan (Signed)
Interdisciplinary Treatment Plan Update (Adult)  Date: 07/19/2011  Time Reviewed: 9:21 AM   Progress in Treatment: Attending groups: Yes Participating in groups: Yes Taking medication as prescribed: Yes Tolerating medication: Yes   Family/Significant othe contact made:  No  Counselor to contact if SI Prevention needed Patient understands diagnosis:  Yes As evidenced by asking for help with alcohol Discussing patient identified problems/goals with staff:  Yes  See below Medical problems stabilized or resolved:  Yes Denies suicidal/homicidal ideation: Yes Issues/concerns per patient self-inventory:  No Not turned in Other:  New problem(s) identified: N/A  Reason for Continuation of Hospitalization: Medication stabilization Withdrawal symptoms  Interventions implemented related to continuation of hospitalization: Librium detox protocol  Encourage group attendance  Additional comments:Munir and CM to call girlfriend to identify plan for sobriety  Estimated length of stay:1-2 days  Discharge Plan:Return home  Follow up at AA  New goal(s): N/A  Review of initial/current patient goals per problem list:   1.  Goal(s):Safely detox from alcohol  Met:  No  Target date:12/28  As evidenced [id number] in CIWA to 0  2.  Goal (s):Work with Vidyuth to identify comprehensive sobriety plan  Met:  No  Target date:12/29  As evidenced by:pt verbalization  3.  Goal(s):  Met:  No  Target date:  As evidenced by:  4.  Goal(s):  Met:  No  Target date:  As evidenced by:  Attendees: Patient:  [name] 07/19/2011 9:21 AM  Family:     Physician:  07/19/2011 9:21 AM   Nursing:  Waynetta Sandy  07/19/2011 9:21 AM   Case Manager:  Richelle Ito, LCSW 07/19/2011 9:21 AM   Counselor:   07/19/2011 9:21 AM   Other:  Shelda Jakes 07/19/2011 9:21 AM  Other:     Other:     Other:      Scribe for Treatment Team:   Ida Rogue, 07/19/2011 9:21 AM

## 2011-07-19 NOTE — Progress Notes (Signed)
[name] meets with the treatment team this morning to discuss his options for care when he is discharged.  He reports little to no withdrawal symptoms and states he feels well.  His appetite, mood and sleep are well within normal limits.  He states no SI/HI, no A/V hallucinations.  A.) Alcohol Detox   P.: Pt. Will meet with the case manager to work out potential treatment after he is discharged. He will consider IOP as a possible source.  D/C is anticipated in the next 1-2 days.

## 2011-07-19 NOTE — Progress Notes (Signed)
Pt denies SI/HI/AVH. Pt participated in groups. Pt appropriate.  Support and encouragement offered to pt. Will continue Q15 min checks for safety.

## 2011-07-19 NOTE — Discharge Planning (Signed)
[name] attended AM group, good participation.  Here for detox from alcohol.  Was here 6 months ago or so.  Stated sober for several months on sheer willpower.  Relapsed.  Decided to come in after getting into argument with girlfriend, with whom he lives, on Christmas night after drinking too much.  Plans to return home.  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  Rejected IOP, but open to meetings.

## 2011-07-19 NOTE — Progress Notes (Signed)
Patient ID: [name], male   DOB: May 18, 1986, 25 y.o.   MRN: [id number] Pt. Resting eyes closed, no distress noted, respirations even and unlabored. Staff will monitor for safety.

## 2011-07-19 NOTE — Progress Notes (Signed)
Pt appropriate interacting on unit. Pt is vested in treatment and having no detox symptoms currently. Offered support and 15 minute checks. Safety maintained on unit.

## 2011-07-20 MED ORDER — LISINOPRIL 10 MG PO TABS
10.0000 mg | ORAL_TABLET | Freq: Every day | ORAL | Status: DC
  Administered 2011-07-20: 10 mg via ORAL
  Filled 2011-07-20: qty 1

## 2011-07-20 MED ORDER — LISINOPRIL 10 MG PO TABS: 10.0000 mg | ORAL_TABLET | Freq: Every day | ORAL | Status: DC

## 2011-07-20 NOTE — Progress Notes (Signed)
[facility] Adult Inpatient Family/Significant Other Suicide Prevention Education  Suicide Prevention Education:  Education Completed; Tia, patient's significant other of 9 years, at [phone] has been identified by the patient as the family member/significant other with whom the patient will be residing, and identified as the person(s) who will aid the patient in the event of a mental health crisis (suicidal ideations/suicide attempt).  With written consent from the patient, the family member/significant other has been provided the following suicide prevention education, prior to the and/or following the discharge of the patient.  The suicide prevention education provided includes the following:  Suicide risk factors  Suicide prevention and interventions  National Suicide Hotline telephone number  [facility] assessment telephone number  [facility] Emergency Assistance 911  [facility] and/or Residential Mobile Crisis Unit telephone number  Request made of family/significant other to:  Remove weapons (e.g., guns, rifles, knives), all items previously/currently identified as safety concern.    Remove drugs/medications (over-the-counter, prescriptions, illicit drugs), all items previously/currently identified as a safety concern.  The family member/significant other verbalizes understanding of the suicide prevention education information provided.  The family member/significant other agrees to remove the items of safety concern listed above. Mobile Crisis Management services also explained to patient  [name] 07/20/2011, 10:15 AM

## 2011-07-20 NOTE — Progress Notes (Signed)
Pt is hoping to discharge today 07/20/11. Has been given mobile crisis management info and contact card in addition to  Alcoholics Anonymous [place] and local meeting schedule. Also sending Al-Anon schedule with patient for his significant other of 9 years. Clide Dales 07/20/2011 10:19 AM

## 2011-07-20 NOTE — Psych (Deleted)
[name] continues to have elevated blood pressures today with no obvious reason.  He is feeling well enough to be discharged and denies H/A, CP, or SOB.  He has never had elevated Bp in the past.  Despite supportive measures to bring the Bp down, resting quietly, checking for the right sized cuff, taking Bp manually, he still continues to have elevated Bp.  After discussion with Dr. Dan Humphreys, patient will be discharged and referred to the ED for further evaluation of Bp.  Pt. Understands and agrees with this plan of care. Rona Ravens. Lajarvis Italiano PAC

## 2011-07-20 NOTE — Progress Notes (Signed)
[facility] Group Notes:  (Counselor/Nursing/MHT/Case Management/Adjunct)  07/20/2011 2:28 PM   Type of Therapy:  Processing Group at 11:00 am  Participation Level:  Affect  Participation Quality:  Appropriate  Affect:  Appropriate  Cognitive:  Appropriate  Insight:  Improved  Engagement in Group:  Good  Engagement in Therapy:  Good  Modes of Intervention:  Exploration, clarification and activity  Summary of Progress/Problems: Paymon chose two photographs during activity; one to represent his life in crisis and another to represent life when things are going well. The photograph to represent crisis depicted a person standing  alone which he relates to on an emotional basis as he feels his addiction detaches him from those he loves and cares about. The other photograph was a child running with balloons and Bonner shared he wishes to spend more time with his children.  [facility] Group Notes:  (Counselor/Nursing/MHT/Case Management/Adjunct)  07/20/2011 2:28 PM   Type of Therapy:  Counseling Group at 1:15 pm  Participation Level:  Appropriate  Participation Quality:  Appropriate  Affect:  Appropriate somewhat anxious for discharge  Cognitive:   appropriate  Insight:  Improving  Engagement in Group:  Good  Engagement in Therapy:  Good  Modes of Intervention: Exploration, Education and problem solving  Summary of Progress/Problems: Arye shared he expects his greatest obstacle will be the free time usually spent drinking, as he drank each evening after work until bedtime or after. The patient also shared he may need to perhaps drive a different way home, not take cash to work with him, and developed a new plan for evenings. Cecile does seem motivated at this point in time to make these changes.  Ronda Fairly, LCSWA 07/20/2011 2:54 PM

## 2011-07-20 NOTE — Progress Notes (Signed)
[facility] Case Management Discharge Plan:  Will you be returning to the same living situation after discharge: Yes,  home Would you like a referral for services when you are discharged:Yes,  [facility] Counseling for assessment Do you have access to transportation at discharge:Yes,  yes Do you have the ability to pay for your medications:Yes,  no meds  Interagency Information:     Patient to Follow up at:  Follow-up Information    Follow up with [facility] on 07/26/2011. (3:30 with Michaelle Copas  This is an assessment for meds and/or counseling related to substance use and depression  Call at least 24 hours in advance if you need to reschedule)    Contact information:   [address]  [place] [address]  [phone]         Patient denies SI/HI:   Yes,  yes    Safety Planning and Suicide Prevention discussed:  Yes,  yes  Barrier to discharge identified:No.  Summary and Recommendations:   [name] 07/20/2011, 10:40 AM

## 2011-07-20 NOTE — Progress Notes (Signed)
Patient ID: [name], male   DOB: 16-Dec-1985, 25 y.o.   MRN: [id number] Pt being discharged to home accompanied by a friend. All discharge information given to Pt, as well as follow up plans. Given instruction to follow up with his blood pressure issues. Given support, reassurance and praise. All belongings returned to Pt.

## 2011-07-20 NOTE — Progress Notes (Signed)
Suicide Risk Assessment  Discharge Assessment     Demographic factors:  Assessment Details Time of Assessment: Admission Information Obtained From: Patient Current Mental Status:  Current Mental Status:  (none) Risk Reduction Factors:  Risk Reduction Factors: Employed;Responsible for children under 27 years of age;Living with another person, especially a relative;Positive social support  CLINICAL FACTORS:   Severe Anxiety and/or Agitation Depression:   Comorbid alcohol abuse/dependence Hopelessness Alcohol/Substance Abuse/Dependencies Previous Psychiatric Diagnoses and Treatments  COGNITIVE FEATURES THAT CONTRIBUTE TO RISK:  No Cognitive risk factors noted.   SUICIDE RISK:   Minimal: No identifiable suicidal ideation.  Patients presenting with no risk factors but with morbid ruminations; may be classified as minimal risk based on the severity of the depressive symptoms  Patient denies suicidal or homicidal ideation, hallucinations, illusions, or delusions. Patient engages with good eye contact, is able to focus adequately in a one to one setting, and has clear goal directed thoughts. Patient speaks with a natural conversational volume, rate, and tone. Anxiety was reported at 2 on a scale of 1 the least and 10 the most. Depression was reported at 2 on the same scale. Patient is oriented times 4, recent and remote memory intact. Judgement: Improved from admission Insight: Has learned he can get help when he asks for it and things can get a lot better. Plan: Remain on . chlordiazePOXIDE  25 mg Oral BH-qamhs   Followed by  . chlordiazePOXIDE  25 mg Oral Daily  . mulitivitamin with minerals  1 tablet Oral Daily  . thiamine  100 mg Oral Daily  Because Librium helps detox and prevent seizures, Multivitamins with minerals and thiamin helps nutritional recovery Pt voices understanding of the risks and benefits of medication. Pratt Bress 07/20/2011, 9:49 AM

## 2011-07-20 NOTE — Discharge Summary (Signed)
  Patient ID: [name] MRN: [id number] DOB/AGE: Sep 11, 1985 25 y.o.  Admit date: 07/18/2011 Discharge date: 07/20/2011  Discharge Diagnoses: Alcohol dependency Active Problems:  * No active hospital problems. *    HPI: [name] was admitted to [facility] from where ED at [facility] where presented with severe alcohol intoxication. He requested assistance with detox. The patient was placed on a librium detox protocol and responded well.   Hospital Course : He was seen and evaluated by the treatment team including the Psychiatrist, PA,Nurses,Social Worker, Sports coach, and Counselor. He was an active participant in the treatment and discharge planning. The treatment included medication management, health assessment, recovery programming through daily groups, counseling, AA, and discharge follow up planning.       He/She was cooperative with staff and got along well with other patients. [name] followed the rules on the unit and was not a security risk.        [name] was active in the social milieu on a daily basis and interacted appropriately with other patients. The patient participated in and endorsed benefits from AA and groups offered on the unit.  [name] was motivated to receive the maximum benefits of unit programming for recovery.          His health improved on a daily basis as the withdrawal symptoms decreased. The CIWA score was monitored daily and decreased appropriately. His mood and attitude improved quickly as he received supportive care. The patient denied SI/HI within 24 hours of admission to [facility]. He/she remained in full contact with reality and did not display any symptoms of psychosis. The patient endorsed concern over maintaining sobriety and worked with the team to develop coping skills to use upon discharge. The treatment team worked to help the patient identify potential stressors that could trigger relapse.     IOP was discussed with the patient and he declined at this  time.The patient  was also educated that continued alcohol use could cause serious health problems, and lead to an early death. He was encouraged to avoid all alcohol upon discharge. On the day of discharge all symptom of withdrawal  had resolved and the patient was released.   Labs: None since admission.  There are no discharge medications for this patient.  Follow-up Information    Follow up with [facility] on 07/26/2011. (3:30 with Michaelle Copas  This is an assessment for meds and/or counseling related to substance use and depression)    Contact information:   [address]  [place] [address]  [phone]   Strongly encouraged to attend local AA meetings.      SignedVerne Spurr [facility] 07/20/2011, 10:39 AM

## 2011-07-20 NOTE — Progress Notes (Signed)
Sire was prepared to be discharged today.  Unfortunately he has had persistently elevated blood pressures despite resting between checks, ensuring the proper sized cuff, and taking it manually.  He denies sx including, H/A, CP, or shortness of breath.  After discussion with the patient regarding this issue, a low dose ACEI will be prescribed and he is strongly encouraged to follow up with in the next 1-2 weeks with his PCP.  A.) elevated Blood pressure P.) Will give Lisinopril 10mg  and educate regarding lifestyle changes including diet, exercise, Sodium intake, smoking and weight loss.

## 2011-07-20 NOTE — Discharge Summary (Signed)
Discharge Note  Patient:  [name] is an 25 y.o., male DOB:  18-Dec-1985  Date of Admission:  07/18/2011  Date of Discharge:  07/20/2011  Level of Care:  OP  Discharge destination:  HOME  Is patient on multiple antipsychotic therapies at discharge:  NO  Patient phone:  [phone] (home) Patient address:   [address] [place] [address]  The patient received suicide prevention pamphlet:  YES Belongings returned:  Valuables  [name] 07/20/2011,9:55 AM

## 2011-07-23 NOTE — Progress Notes (Signed)
Patient Discharge Instructions:  Admission Note Faxed,  07/23/2011 Discharge Note Faxed,   07/23/2011 After Visit Summary Faxed,  07/23/2011 Faxed to the Next Level Care provider:  07/23/2011 D/C Summary faxed 07/23/2011 Facesheet faxed 07/23/2011   Faxed to [facility] Counseling @ [phone]  Heloise Purpura, Eduard Clos, 07/23/2011, 1:11 PM

## 2013-01-03 ENCOUNTER — Emergency Department (HOSPITAL_COMMUNITY)
Admission: EM | Admit: 2013-01-03 | Discharge: 2013-01-03 | Disposition: A | Discharge status: Home / Self Care | Payer: Managed Care, Other (non HMO) | Attending: Emergency Medicine | Admitting: Emergency Medicine

## 2013-01-03 ENCOUNTER — Encounter (HOSPITAL_COMMUNITY): Payer: Self-pay

## 2013-01-03 DIAGNOSIS — I10 Essential (primary) hypertension: Secondary | ICD-10-CM | POA: Insufficient documentation

## 2013-01-03 DIAGNOSIS — Z8659 Personal history of other mental and behavioral disorders: Secondary | ICD-10-CM | POA: Insufficient documentation

## 2013-01-03 DIAGNOSIS — F1010 Alcohol abuse, uncomplicated: Secondary | ICD-10-CM | POA: Insufficient documentation

## 2013-01-03 DIAGNOSIS — Z79899 Other long term (current) drug therapy: Secondary | ICD-10-CM | POA: Insufficient documentation

## 2013-01-03 HISTORY — DX: Essential (primary) hypertension: I10

## 2013-01-03 LAB — COMPREHENSIVE METABOLIC PANEL
ALT: 30 U/L (ref 0–53)
AST: 31 U/L (ref 0–37)
Albumin: 4.6 g/dL (ref 3.5–5.2)
Alkaline Phosphatase: 82 U/L (ref 39–117)
BUN: 9 mg/dL (ref 6–23)
CO2: 27 mEq/L (ref 19–32)
Calcium: 9.6 mg/dL (ref 8.4–10.5)
Chloride: 104 mEq/L (ref 96–112)
Creatinine, Ser: 0.86 mg/dL (ref 0.50–1.35)
GFR calc Af Amer: >90 mL/min (ref >90)
GFR calc non Af Amer: >90 mL/min (ref >90)
Glucose, Bld: 105 mg/dL — ABNORMAL HIGH (ref 70–99)
Potassium: 3.8 mEq/L (ref 3.5–5.1)
Sodium: 142 mEq/L (ref 135–145)
Total Bilirubin: 0.2 mg/dL — ABNORMAL LOW (ref 0.3–1.2)
Total Protein: 8.5 g/dL — ABNORMAL HIGH (ref 6.0–8.3)

## 2013-01-03 LAB — RAPID URINE DRUG SCREEN, HOSP PERFORMED
Amphetamines: NOT DETECTED
Barbiturates: NOT DETECTED
Benzodiazepines: NOT DETECTED
Cocaine: NOT DETECTED
Opiates: NOT DETECTED
Tetrahydrocannabinol: NOT DETECTED

## 2013-01-03 LAB — CBC
HCT: 45.2 % (ref 39.0–52.0)
MCHC: 35.4 g/dL (ref 30.0–36.0)
Platelets: 241 10*3/uL (ref 150–400)
RDW: 11.9 % (ref 11.5–15.5)

## 2013-01-03 LAB — ETHANOL: Alcohol, Ethyl (B): 194 mg/dL — ABNORMAL HIGH (ref 0–11)

## 2013-01-03 NOTE — ED Notes (Signed)
Pt requesting detox from alcohol. Pt has hx of alcohol use. Was sober for a year and a half. Recent stress factors caused him to relapse. Pt reports drinking for the past week. Recently had 8 cans of beer since yesterday. Pt wants to implement detox now before things worst.

## 2013-01-03 NOTE — ED Provider Notes (Signed)
History     CSN: [id number]  Arrival date & time 01/03/13  4098   First MD Initiated Contact with Patient 01/03/13 0249      Chief Complaint  Patient presents with  . Medical Clearance    (Consider location/radiation/quality/duration/timing/severity/associated sxs/prior treatment) HPI 27 year old male presents to emergency department with complaint of alcohol abuse and requesting detox.  Patient reports he was sober for a year and a half  He reports last week he has been drinking 8-10 beers a day.  Patient is afraid that he may start abusing alcohol worse.  He denies previous history of alcohol withdrawal, DTs, seizures or other complications.  He reports he did have inpatient detox about a year and half ago.  Patient currently with no symptoms when he does not drink.  He is able to function at a job during the day and usually drinks 6-8 up to 10 beers in the evenings.  Past Medical History  Diagnosis Date  . Alcohol abuse   . Depression   . Hypertension     Past Surgical History  Procedure Laterality Date  . Fracture surgery      History reviewed. No pertinent family history.  History  Substance Use Topics  . Smoking status: Never Smoker   . Smokeless tobacco: Not on file  . Alcohol Use: 0.0 oz/week    12-18 Cans of beer per week     Comment: heavy drinker      Review of Systems  All other systems reviewed and are negative.    Allergies  Cashew nut oil  Home Medications   Current Outpatient Rx  Name  Route  Sig  Dispense  Refill  . Ephedrine-Guaifenesin (BRONKAID) 25-400 MG TABS   Oral   Take 1 tablet by mouth every 12 (twelve) hours as needed (for asthma).           BP 139/94  Pulse 104  Temp(Src) 99.4 F (37.4 C) (Oral)  Resp 18  Ht 5\' 9"  (1.753 m)  Wt 152 lb 6 oz (69.117 kg)  BMI 22.49 kg/m2  SpO2 100%  Physical Exam  Nursing note and vitals reviewed. Constitutional: He is oriented to person, place, and time. He appears well-developed and  well-nourished.  HENT:  Head: Normocephalic and atraumatic.  Left Ear: External ear normal.  Nose: Nose normal.  Mouth/Throat: Oropharynx is clear and moist.  Eyes: Conjunctivae and EOM are normal. Pupils are equal, round, and reactive to light.  Neck: Normal range of motion. Neck supple. No JVD present. No tracheal deviation present. No thyromegaly present.  Cardiovascular: Normal rate, regular rhythm, normal heart sounds and intact distal pulses.  Exam reveals no gallop and no friction rub.   No murmur heard. Pulmonary/Chest: Effort normal and breath sounds normal. No stridor. No respiratory distress. He has no wheezes. He has no rales. He exhibits no tenderness.  Abdominal: Soft. Bowel sounds are normal. He exhibits no distension and no mass. There is no tenderness. There is no rebound and no guarding.  Musculoskeletal: Normal range of motion. He exhibits no edema and no tenderness.  Lymphadenopathy:    He has no cervical adenopathy.  Neurological: He is alert and oriented to person, place, and time. He has normal reflexes. No cranial nerve deficit. He exhibits normal muscle tone. Coordination normal.  Skin: Skin is warm and dry. No rash noted. No erythema. No pallor.  Psychiatric: He has a normal mood and affect. His behavior is normal. Judgment and thought content normal.  ED Course  Procedures (including critical care time)  Labs Reviewed  COMPREHENSIVE METABOLIC PANEL - Abnormal; Notable for the following:    Glucose, Bld 105 (*)    Total Protein 8.5 (*)    Total Bilirubin 0.2 (*)    All other components within normal limits  ETHANOL - Abnormal; Notable for the following:    Alcohol, Ethyl (B) 194 (*)    All other components within normal limits  CBC  URINE RAPID DRUG SCREEN (HOSP PERFORMED)   No results found.   1. Alcohol abuse       MDM  27 year old male with alcohol abuse.  Discussed with behavior health counselor, 8, who will see and make recommendations for  treatment.        Olivia Mackie, MD 01/03/13 [phone]

## 2013-01-03 NOTE — ED Notes (Signed)
Pt changed into scrubs and seen by security.  Has 1 bag of belongings.

## 2013-03-02 ENCOUNTER — Emergency Department (INDEPENDENT_AMBULATORY_CARE_PROVIDER_SITE_OTHER)
Admission: EM | Admit: 2013-03-02 | Discharge: 2013-03-02 | Disposition: A | Discharge status: Home / Self Care | Payer: Managed Care, Other (non HMO) | Source: Home / Self Care

## 2013-03-02 DIAGNOSIS — K2990 Gastroduodenitis, unspecified, without bleeding: Secondary | ICD-10-CM

## 2013-03-02 DIAGNOSIS — K2970 Gastritis, unspecified, without bleeding: Secondary | ICD-10-CM

## 2013-03-02 LAB — POCT I-STAT, CHEM 8
Calcium, Ion: 1.18 mmol/L (ref 1.12–1.23)
Chloride: 102 mEq/L (ref 96–112)
HCT: 49 % (ref 39.0–52.0)
Potassium: 4.4 mEq/L (ref 3.5–5.1)
Sodium: 142 mEq/L (ref 135–145)

## 2013-03-02 LAB — COMPREHENSIVE METABOLIC PANEL
BUN: 12 mg/dL (ref 6–23)
Calcium: 9.7 mg/dL (ref 8.4–10.5)
Creatinine, Ser: 0.88 mg/dL (ref 0.50–1.35)
GFR calc Af Amer: >90 mL/min (ref >90)
Glucose, Bld: 106 mg/dL — ABNORMAL HIGH (ref 70–99)
Total Protein: 7.8 g/dL (ref 6.0–8.3)

## 2013-03-02 LAB — LIPASE, BLOOD: Lipase: 31 U/L (ref 11–59)

## 2013-03-02 MED ORDER — GI COCKTAIL ~~LOC~~
ORAL | Status: AC
  Filled 2013-03-02: qty 30

## 2013-03-02 MED ORDER — OMEPRAZOLE 40 MG PO CPDR: 40.0000 mg | DELAYED_RELEASE_CAPSULE | Freq: Every day | ORAL | Status: DC

## 2013-03-02 MED ORDER — GI COCKTAIL ~~LOC~~
30.0000 mL | Freq: Once | ORAL | Status: AC
  Administered 2013-03-02: 30 mL via ORAL

## 2013-03-02 NOTE — ED Provider Notes (Signed)
[name] is a 27 y.o. male who presents to Urgent Care today for epigastric abdominal pain occurring for about one week worsening recently. He developed vomiting this morning. He denies any blood in the stool or vomit. He denies any fevers or chills. The pain is moderate.  Patient's medical history significant for alcohol abuse. He is currently drinking about 12 beers a day several days a week. He denies any recent heavy NSAID use or aspirin use.  He denies any history of significant abdominal pain or history of pancreatitis.    PMH reviewed. As above History  Substance Use Topics  . Smoking status: Never Smoker   . Smokeless tobacco: Not on file  . Alcohol Use: 0.0 oz/week    12-18 Cans of beer per week     Comment: heavy drinker   ROS as above Medications reviewed. No current facility-administered medications for this encounter.   Current Outpatient Prescriptions  Medication Sig Dispense Refill  . omeprazole (PRILOSEC) 40 MG capsule Take 1 capsule (40 mg total) by mouth daily.  30 capsule  1    Exam:  BP 131/87  Pulse 80  Temp(Src) 98.7 F (37.1 C) (Oral)  Resp 16  SpO2 100% Gen: Well NAD, nontoxic appearing HEENT: EOMI,  MMM Lungs: CTABL Nl WOB Heart: RRR no MRG Abd: NABS, nondistended tender to palpation epigastric and upper left quadrant. No rebound or guarding.  Exts: Non edematous BL  LE, warm and well perfused.   Results for orders placed during the hospital encounter of 03/02/13 (from the past 24 hour(s))  POCT I-STAT, CHEM 8     Status: Abnormal   Collection Time    03/02/13 11:41 AM      Result Value Range   Sodium 142  135 - 145 mEq/L   Potassium 4.4  3.5 - 5.1 mEq/L   Chloride 102  96 - 112 mEq/L   BUN 12  6 - 23 mg/dL   Creatinine, Ser 4.09  0.50 - 1.35 mg/dL   Glucose, Bld 811 (*) 70 - 99 mg/dL   Calcium, Ion 9.14  7.82 - 1.23 mmol/L   TCO2 27  0 - 100 mmol/L   Hemoglobin 16.7  13.0 - 17.0 g/dL   HCT 95.6  21.3 - 08.6 %    Patient had  significant improvement of symptoms following GI cocktail. No results found.  Assessment and Plan: 27 y.o. male with likely gastritis.  Patient had improvement with GI cocktail and has been drinking heavily recently. Alternative diagnosis is pancreatitis.  Discussed the options with the patient. Have obtained a CMP and lipase which will return in several hours.  Patient looks well. Plan to discharge home with omeprazole and decreased alcohol intake. If lipase are CMP are significantly abnormal will call patient and he will present to the emergency room.  He agrees with this plan.  Discussed warning signs or symptoms. Please see discharge instructions. Patient expresses understanding.      Rodolph Bong, MD 03/02/13 [phone]

## 2013-04-01 ENCOUNTER — Inpatient Hospital Stay (HOSPITAL_COMMUNITY)
Admission: AD | Admit: 2013-04-01 | Discharge: 2013-04-03 | DRG: 897 | Disposition: A | Discharge status: Home / Self Care | Payer: Managed Care, Other (non HMO) | Source: Intra-hospital | Attending: Psychiatry | Admitting: Psychiatry

## 2013-04-01 ENCOUNTER — Emergency Department (HOSPITAL_COMMUNITY)
Admission: EM | Admit: 2013-04-01 | Discharge: 2013-04-01 | Disposition: A | Discharge status: Other Acute Inpatient Hospital | Payer: Managed Care, Other (non HMO) | Attending: Emergency Medicine | Admitting: Emergency Medicine

## 2013-04-01 ENCOUNTER — Encounter (HOSPITAL_COMMUNITY): Payer: Self-pay

## 2013-04-01 ENCOUNTER — Encounter (HOSPITAL_COMMUNITY): Payer: Self-pay | Admitting: Emergency Medicine

## 2013-04-01 DIAGNOSIS — F410 Panic disorder [episodic paroxysmal anxiety] without agoraphobia: Secondary | ICD-10-CM | POA: Diagnosis present

## 2013-04-01 DIAGNOSIS — F4010 Social phobia, unspecified: Secondary | ICD-10-CM | POA: Diagnosis present

## 2013-04-01 DIAGNOSIS — F10939 Alcohol use, unspecified with withdrawal, unspecified: Principal | ICD-10-CM | POA: Diagnosis present

## 2013-04-01 DIAGNOSIS — I10 Essential (primary) hypertension: Secondary | ICD-10-CM | POA: Insufficient documentation

## 2013-04-01 DIAGNOSIS — F10239 Alcohol dependence with withdrawal, unspecified: Principal | ICD-10-CM | POA: Diagnosis present

## 2013-04-01 DIAGNOSIS — F1010 Alcohol abuse, uncomplicated: Secondary | ICD-10-CM | POA: Insufficient documentation

## 2013-04-01 DIAGNOSIS — F329 Major depressive disorder, single episode, unspecified: Secondary | ICD-10-CM | POA: Insufficient documentation

## 2013-04-01 DIAGNOSIS — F3289 Other specified depressive episodes: Secondary | ICD-10-CM | POA: Insufficient documentation

## 2013-04-01 DIAGNOSIS — F321 Major depressive disorder, single episode, moderate: Secondary | ICD-10-CM | POA: Diagnosis present

## 2013-04-01 DIAGNOSIS — F1020 Alcohol dependence, uncomplicated: Secondary | ICD-10-CM | POA: Diagnosis present

## 2013-04-01 DIAGNOSIS — F411 Generalized anxiety disorder: Secondary | ICD-10-CM | POA: Diagnosis present

## 2013-04-01 DIAGNOSIS — R45851 Suicidal ideations: Secondary | ICD-10-CM

## 2013-04-01 LAB — CBC WITH DIFFERENTIAL/PLATELET
Basophils Absolute: 0 10*3/uL (ref 0.0–0.1)
Basophils Relative: 0 % (ref 0–1)
Eosinophils Absolute: 0.1 10*3/uL (ref 0.0–0.7)
Eosinophils Relative: 2 % (ref 0–5)
HCT: 46.2 % (ref 39.0–52.0)
Hemoglobin: 16.7 g/dL (ref 13.0–17.0)
Lymphocytes Relative: 38 % (ref 12–46)
Lymphs Abs: 2.7 10*3/uL (ref 0.7–4.0)
MCH: 31.7 pg (ref 26.0–34.0)
MCHC: 36.1 g/dL — ABNORMAL HIGH (ref 30.0–36.0)
MCV: 87.8 fL (ref 78.0–100.0)
Monocytes Absolute: 0.8 10*3/uL (ref 0.1–1.0)
Monocytes Relative: 11 % (ref 3–12)
Neutro Abs: 3.6 10*3/uL (ref 1.7–7.7)
Neutrophils Relative %: 50 % (ref 43–77)
Platelets: 232 10*3/uL (ref 150–400)
RBC: 5.26 MIL/uL (ref 4.22–5.81)
RDW: 12.2 % (ref 11.5–15.5)
WBC: 7.2 10*3/uL (ref 4.0–10.5)

## 2013-04-01 LAB — ETHANOL
Alcohol, Ethyl (B): 263 mg/dL — ABNORMAL HIGH (ref 0–11)
Alcohol, Ethyl (B): 167 mg/dL — ABNORMAL HIGH (ref 0–11)

## 2013-04-01 LAB — COMPREHENSIVE METABOLIC PANEL
Albumin: 4.3 g/dL (ref 3.5–5.2)
Alkaline Phosphatase: 59 U/L (ref 39–117)
BUN: 9 mg/dL (ref 6–23)
Calcium: 9.3 mg/dL (ref 8.4–10.5)
Creatinine, Ser: 0.87 mg/dL (ref 0.50–1.35)
Potassium: 3.8 mEq/L (ref 3.5–5.1)
Total Protein: 7.8 g/dL (ref 6.0–8.3)

## 2013-04-01 LAB — RAPID URINE DRUG SCREEN, HOSP PERFORMED
Amphetamines: NOT DETECTED
Barbiturates: NOT DETECTED
Benzodiazepines: NOT DETECTED
Cocaine: NOT DETECTED
Opiates: NOT DETECTED
Tetrahydrocannabinol: NOT DETECTED

## 2013-04-01 MED ORDER — LOPERAMIDE HCL 2 MG PO CAPS: 2.0000 mg | ORAL_CAPSULE | ORAL | Status: DC | PRN

## 2013-04-01 MED ORDER — ONDANSETRON 4 MG PO TBDP: 4.0000 mg | ORAL_TABLET | Freq: Four times a day (QID) | ORAL | Status: DC | PRN

## 2013-04-01 MED ORDER — LORAZEPAM 1 MG PO TABS
1.0000 mg | ORAL_TABLET | Freq: Once | ORAL | Status: AC
  Administered 2013-04-01: 1 mg via ORAL
  Filled 2013-04-01: qty 1

## 2013-04-01 MED ORDER — VITAMIN B-1 100 MG PO TABS
100.0000 mg | ORAL_TABLET | Freq: Every day | ORAL | Status: DC
  Administered 2013-04-02 – 2013-04-03 (×2): 100 mg via ORAL
  Filled 2013-04-01 (×4): qty 1

## 2013-04-01 MED ORDER — ALUM & MAG HYDROXIDE-SIMETH 200-200-20 MG/5ML PO SUSP: 30.0000 mL | ORAL | Status: DC | PRN

## 2013-04-01 MED ORDER — ACETAMINOPHEN 325 MG PO TABS: 650.0000 mg | ORAL_TABLET | ORAL | Status: DC | PRN

## 2013-04-01 MED ORDER — LORAZEPAM 1 MG PO TABS: 1.0000 mg | ORAL_TABLET | Freq: Three times a day (TID) | ORAL | Status: DC | PRN

## 2013-04-01 MED ORDER — THIAMINE HCL 100 MG/ML IJ SOLN
100.0000 mg | Freq: Once | INTRAMUSCULAR | Status: AC
  Administered 2013-04-01: 100 mg via INTRAMUSCULAR

## 2013-04-01 MED ORDER — VITAMIN B-1 100 MG PO TABS
100.0000 mg | ORAL_TABLET | Freq: Every day | ORAL | Status: DC
  Filled 2013-04-01 (×2): qty 1

## 2013-04-01 MED ORDER — ACETAMINOPHEN 325 MG PO TABS
650.0000 mg | ORAL_TABLET | Freq: Four times a day (QID) | ORAL | Status: DC | PRN
  Administered 2013-04-02 – 2013-04-03 (×3): 650 mg via ORAL

## 2013-04-01 MED ORDER — CHLORDIAZEPOXIDE HCL 25 MG PO CAPS
25.0000 mg | ORAL_CAPSULE | Freq: Three times a day (TID) | ORAL | Status: DC
  Administered 2013-04-03 (×2): 25 mg via ORAL
  Filled 2013-04-01 (×2): qty 1

## 2013-04-01 MED ORDER — CHLORDIAZEPOXIDE HCL 25 MG PO CAPS
25.0000 mg | ORAL_CAPSULE | Freq: Four times a day (QID) | ORAL | Status: AC
  Administered 2013-04-01 – 2013-04-02 (×4): 25 mg via ORAL
  Filled 2013-04-01 (×4): qty 1

## 2013-04-01 MED ORDER — CHLORDIAZEPOXIDE HCL 25 MG PO CAPS: 25.0000 mg | ORAL_CAPSULE | Freq: Four times a day (QID) | ORAL | Status: DC | PRN

## 2013-04-01 MED ORDER — IBUPROFEN 200 MG PO TABS: 600.0000 mg | ORAL_TABLET | Freq: Three times a day (TID) | ORAL | Status: DC | PRN

## 2013-04-01 MED ORDER — CHLORDIAZEPOXIDE HCL 25 MG PO CAPS: 25.0000 mg | ORAL_CAPSULE | ORAL | Status: DC

## 2013-04-01 MED ORDER — MAGNESIUM HYDROXIDE 400 MG/5ML PO SUSP: 30.0000 mL | Freq: Every day | ORAL | Status: DC | PRN

## 2013-04-01 MED ORDER — THIAMINE HCL 100 MG/ML IJ SOLN: 100.0000 mg | Freq: Once | INTRAMUSCULAR | Status: DC

## 2013-04-01 MED ORDER — ADULT MULTIVITAMIN W/MINERALS CH
1.0000 | ORAL_TABLET | Freq: Every day | ORAL | Status: DC
  Filled 2013-04-01: qty 1

## 2013-04-01 MED ORDER — NICOTINE 21 MG/24HR TD PT24: 21.0000 mg | MEDICATED_PATCH | Freq: Every day | TRANSDERMAL | Status: DC

## 2013-04-01 MED ORDER — TRAZODONE HCL 50 MG PO TABS
50.0000 mg | ORAL_TABLET | Freq: Every evening | ORAL | Status: DC | PRN
  Administered 2013-04-01 – 2013-04-02 (×2): 50 mg via ORAL
  Filled 2013-04-01: qty 1
  Filled 2013-04-01: qty 28
  Filled 2013-04-01: qty 1

## 2013-04-01 MED ORDER — HYDROXYZINE HCL 25 MG PO TABS: 25.0000 mg | ORAL_TABLET | Freq: Four times a day (QID) | ORAL | Status: DC | PRN

## 2013-04-01 MED ORDER — ACETAMINOPHEN 325 MG PO TABS: 650.0000 mg | ORAL_TABLET | Freq: Four times a day (QID) | ORAL | Status: DC | PRN

## 2013-04-01 MED ORDER — ONDANSETRON HCL 4 MG PO TABS: 4.0000 mg | ORAL_TABLET | Freq: Three times a day (TID) | ORAL | Status: DC | PRN

## 2013-04-01 MED ORDER — ADULT MULTIVITAMIN W/MINERALS CH
1.0000 | ORAL_TABLET | Freq: Every day | ORAL | Status: DC
  Administered 2013-04-02 – 2013-04-03 (×2): 1 via ORAL
  Filled 2013-04-01 (×4): qty 1

## 2013-04-01 MED ORDER — CHLORDIAZEPOXIDE HCL 25 MG PO CAPS: 25.0000 mg | ORAL_CAPSULE | Freq: Every day | ORAL | Status: DC

## 2013-04-01 NOTE — ED Notes (Signed)
Belongings inventoried and placed in containers and locked with security.

## 2013-04-01 NOTE — ED Provider Notes (Signed)
CSN: [id number]     Arrival date & time 04/01/13  0131 History   First MD Initiated Contact with Patient 04/01/13 0200     Chief Complaint  Patient presents with  . Suicidal   (Consider location/radiation/quality/duration/timing/severity/associated sxs/prior Treatment) HPI History provided by patient. Feeling suicidal, also requesting detox from alcohol. He has 2 children, states he would never hurt himself as having thoughts of overdose or possibly shooting himself. Has history of depression. Has been treated at behavioral health in the past and is hoping he can be admitted there at this time. No homicidal ideation. No hallucinations. He is currently not taking any medications. Symptoms moderate in severity. Past Medical History  Diagnosis Date  . Alcohol abuse   . Depression   . Hypertension    Past Surgical History  Procedure Laterality Date  . Fracture surgery     No family history on file. History  Substance Use Topics  . Smoking status: Never Smoker   . Smokeless tobacco: Not on file  . Alcohol Use: 0.0 oz/week    12-18 Cans of beer per week     Comment: heavy drinker    Review of Systems  Constitutional: Negative for fever and chills.  HENT: Negative for neck pain and neck stiffness.   Eyes: Negative for visual disturbance.  Respiratory: Negative for shortness of breath.   Cardiovascular: Negative for chest pain.  Gastrointestinal: Negative for abdominal pain.  Genitourinary: Negative for hematuria.  Musculoskeletal: Negative for back pain.  Skin: Negative for rash.  Neurological: Negative for headaches.  Psychiatric/Behavioral: Positive for suicidal ideas. Negative for self-injury.  All other systems reviewed and are negative.    Allergies  Cashew nut oil  Home Medications   Current Outpatient Rx  Name  Route  Sig  Dispense  Refill  . omeprazole (PRILOSEC) 40 MG capsule   Oral   Take 1 capsule (40 mg total) by mouth daily.   30 capsule   1    BP  145/99  Pulse 119  Temp(Src) 98.4 F (36.9 C) (Oral)  Resp 16  SpO2 98% Physical Exam  Constitutional: He is oriented to person, place, and time. He appears well-developed and well-nourished.  HENT:  Head: Normocephalic and atraumatic.  Eyes: EOM are normal. Pupils are equal, round, and reactive to light.  Neck: Neck supple.  Cardiovascular: Normal rate, regular rhythm and intact distal pulses.   Pulmonary/Chest: Effort normal and breath sounds normal. No respiratory distress.  Abdominal: He exhibits no distension. There is no tenderness.  Musculoskeletal: Normal range of motion. He exhibits no edema.  Neurological: He is alert and oriented to person, place, and time.  Skin: Skin is warm and dry.  Psychiatric: His behavior is normal. Judgment and thought content normal.    ED Course  Procedures (including critical care time) Labs Review Labs Reviewed  ETHANOL - Abnormal; Notable for the following:    Alcohol, Ethyl (B) 263 (*)    All other components within normal limits  CBC WITH DIFFERENTIAL - Abnormal; Notable for the following:    MCHC 36.1 (*)    All other components within normal limits  COMPREHENSIVE METABOLIC PANEL - Abnormal; Notable for the following:    Total Bilirubin 0.1 (*)    All other components within normal limits  URINE RAPID DRUG SCREEN (HOSP PERFORMED)   Will send labs and check urinalysis/UDS.  2:04 AM patient is medically cleared for psych dispo. Plan reassess SI when sober.   MDM  Diagnosis: #1 suicidal  ideation, #2 alcohol abuse  Vital signs and nursing notes reviewed and considered  Labs reviewed  Sunnie Nielsen, MD 04/01/13 [phone]

## 2013-04-01 NOTE — Progress Notes (Signed)
Adult Psychoeducational Group Note  Date:  04/01/2013 Time:  9:49 PM  Group Topic/Focus:  NA group  Participation Level:  Active  Participation Quality:  Appropriate  Affect:  Appropriate  Cognitive:  Alert  Insight: Lacking  Engagement in Group:  Engaged  Modes of Intervention:  Discussion  Additional Comments:    Flonnie Hailstone 04/01/2013, 9:49 PM

## 2013-04-01 NOTE — Progress Notes (Signed)
Patient ID: [name], male   DOB: 1986/02/15, 27 y.o.   MRN: [id number] Admission note: D:Patient is a  Voluntary admission in no acute distress for alcohol dependence and depression. Pt stated she has been sober for about a year and a half. He reports increased stress from his landlord when he reported having black mold in his house. Pt moved out about two months ago and has been drinking ever since. Pt stated he made commenst about killing himself with a shotgun and his girlfriend called pt reports a history of depression and used to cut himself as a child. Pt also reports family history of alcoholism. Pt endorse blackouts but denies seizures. Pt stated he want to seek help for his family (girlfriend and 2 kids). Pt endorses auditory hallucination seeing shadows. Pt denies VH/SI and pain  A: Pt admitted to unit per protocol, skin assessment and belonging search done. No skin issues noted. Consent signed by pt. Pt educated on therapeutic milieu rules. Pt was introduced to milieu by nursing staff. Fall risk safety plan explained to the patient. Pt offered food and drink.15 minutes checks started for safety.  R: Pt was receptive to education. Pt refused food and drink. Writer offered support.

## 2013-04-01 NOTE — ED Notes (Signed)
Father of 2 kids 8 and 6, been drinking for several years, was clean for 1 year and relapsed, now wants to get some help, not having SI, or HI.

## 2013-04-01 NOTE — BH Assessment (Signed)
Assessment Note  Patient requests detox from alcohol.   Patients last drink was today at 2am.  Patient reports that he does not remember how much he drank.  Patient reports that he relapsed last month when he was feeling stressed due to moving to another home.  Patient reports that he was sober for a year and a half prior to relapsing.  Patient reports that he has been drinking a case of beer daily for the past month. Patient BAL is 263.  Documentation in the chart reports that the patient called [employer] because he when he went to reach for the shot gun that was under his bed. Patient told this nurse that there are other things going on not just needing help with alcohol.   Patient reports the other things that he needs help with is depression and seeing shadows that no one else can see.  Patient reports feelings scared when he saw shadows while he was at work.  Patient was not able to describe how the shadow looked.  Patient denies any commands from the shadows.  Patient was not able to tell me when he first began seeing the shadows.  Patient reports that he knows that, "the shadows are not real because no one else is able to see them".  Patient denies prior mental health treatment.  Patient denies prior psychiatric hospitalization.   Patient denies SI.  Patient reports that he has children.  Patient denies HI.      Axis I: Alcohol Dependence and Mood Disorder  Axis II: Deferred Axis III:  Past Medical History  Diagnosis Date  . Alcohol abuse   . Depression   . Hypertension    Axis IV: other psychosocial or environmental problems, problems related to social environment and problems with primary support group Axis V: 31-40 impairment in reality testing  Past Medical History:  Past Medical History  Diagnosis Date  . Alcohol abuse   . Depression   . Hypertension     Past Surgical History  Procedure Laterality Date  . Fracture surgery      Family History: No family history on  file.  Social History:  reports that he has never smoked. He does not have any smokeless tobacco history on file. He reports that  drinks alcohol. He reports that he uses illicit drugs (Marijuana).  Additional Social History:     CIWA: CIWA-Ar BP: 109/65 mmHg Pulse Rate: 119 Nausea and Vomiting: no nausea and no vomiting Tactile Disturbances: none Tremor: no tremor Auditory Disturbances: not present Paroxysmal Sweats: no sweat visible Visual Disturbances: not present Anxiety: no anxiety, at ease Headache, Fullness in Head: none present Agitation: normal activity Orientation and Clouding of Sensorium: oriented and can do serial additions CIWA-Ar Total: 0 COWS: Clinical Opiate Withdrawal Scale (COWS) Resting Pulse Rate: Pulse Rate 80 or below Sweating: No report of chills or flushing Restlessness: Able to sit still Pupil Size: Pupils pinned or normal size for room light Bone or Joint Aches: Not present Runny Nose or Tearing: Not present GI Upset: No GI symptoms Tremor: No tremor Yawning: No yawning Anxiety or Irritability: None Gooseflesh Skin: Skin is smooth COWS Total Score: 0  Allergies:  Allergies  Allergen Reactions  . Cashew Nut Oil Nausea And Vomiting    Allergy to cashews only; Tolerates other nuts    Home Medications:  (Not in a hospital admission)  OB/GYN Status:  No LMP for male patient.  General Assessment Data Location of Assessment: [facility] Assessment Services Is this  a Tele or Face-to-Face Assessment?: Face-to-Face Is this an Initial Assessment or a Re-assessment for this encounter?: Initial Assessment Living Arrangements: Spouse/significant other Can pt return to current living arrangement?: Yes Admission Status: Voluntary Is patient capable of signing voluntary admission?: Yes Transfer from: Acute Hospital Referral Source: Other  Medical Screening Exam [facility] Walk-in ONLY) Medical Exam completed: Yes  [facility] Crisis Care Plan Living Arrangements:  Spouse/significant other  Education Status Is patient currently in school?: No  Risk to self Suicidal Ideation: No Suicidal Intent: No Is patient at risk for suicide?: No Suicidal Plan?: No Access to Means: Yes Specify Access to Suicidal Means: Patient has guns in the home What has been your use of drugs/alcohol within the last 12 months?: alcohol  Previous Attempts/Gestures: No How many times?: 0 Other Self Harm Risks: cutting and burning in the past. Triggers for Past Attempts: Unpredictable Intentional Self Injurious Behavior: Cutting;Burning (In the past ) Comment - Self Injurious Behavior: cutting and burning  Family Suicide History: No Recent stressful life event(s):  (Moving ) Persecutory voices/beliefs?: No Depression: Yes Depression Symptoms: Despondent;Fatigue;Guilt;Feeling worthless/self pity;Feeling angry/irritable Substance abuse history and/or treatment for substance abuse?: Yes Suicide prevention information given to non-admitted patients: Yes  Risk to Others Homicidal Ideation: No Thoughts of Harm to Others: No Current Homicidal Intent: No Current Homicidal Plan: No Access to Homicidal Means: No Identified Victim: None History of harm to others?: No Assessment of Violence: None Noted Violent Behavior Description: calm Does patient have access to weapons?: Yes (Comment) Criminal Charges Pending?: No Does patient have a court date: No  Psychosis Hallucinations: Visual Delusions: None noted  Mental Status Report Appear/Hygiene: Disheveled Eye Contact: Fair Motor Activity: Freedom of movement Speech: Logical/coherent Level of Consciousness: Quiet/awake Mood: Depressed;Preoccupied Affect: Sullen Anxiety Level: None Thought Processes: Coherent;Relevant Judgement: Unimpaired Orientation: Person;Place;Time;Situation Obsessive Compulsive Thoughts/Behaviors: None  Cognitive Functioning Concentration: Decreased Memory: Recent Intact;Remote Intact IQ:  Average Insight: Fair Impulse Control: Poor Appetite: Poor Weight Loss: 0 Weight Gain: 0 Sleep: No Change Total Hours of Sleep: 7 Vegetative Symptoms: None  ADLScreening [facility] Assessment Services) Patient's cognitive ability adequate to safely complete daily activities?: Yes Patient able to express need for assistance with ADLs?: Yes Independently performs ADLs?: Yes (appropriate for developmental age)  Prior Inpatient Therapy Prior Inpatient Therapy: Yes Prior Therapy Dates: 2012, 2011 Prior Therapy Facilty/Provider(s): [facility] Reason for Treatment: Substance Abuse   Prior Outpatient Therapy Prior Outpatient Therapy: No Prior Therapy Dates: na Prior Therapy Facilty/Provider(s): na Reason for Treatment: na  ADL Screening (condition at time of admission) Patient's cognitive ability adequate to safely complete daily activities?: Yes Patient able to express need for assistance with ADLs?: Yes Independently performs ADLs?: Yes (appropriate for developmental age)                  Additional Information 1:1 In Past 12 Months?: No CIRT Risk: No Elopement Risk: No Does patient have medical clearance?: Yes     Disposition: Inpatient hospitalization for detox. Disposition Initial Assessment Completed for this Encounter: Yes Disposition of Patient: Inpatient treatment program Type of inpatient treatment program: Adult  On Site Evaluation by:   Reviewed with Physician:    Phillip Heal LaVerne 04/01/2013 10:38 AM

## 2013-04-01 NOTE — ED Notes (Signed)
MD at bedside. 

## 2013-04-01 NOTE — ED Notes (Signed)
[employer] reported patient called for help when he went to reach for the shot gun that was under his bed.  Patient told this nurse that there are other things going on not just needing help with alcohol.  Would not elaborate on the other things.

## 2013-04-01 NOTE — ED Notes (Signed)
Pt. reports suicidal ideation plans to overdose on pills or shoot himself , also requesting detox for ETOH abuse, charge nurse notified for sitter.

## 2013-04-01 NOTE — ED Notes (Signed)
telepsych being done with [name] from [facility]

## 2013-04-01 NOTE — ED Notes (Signed)
Patient denies pain and is resting comfortably.  

## 2013-04-02 ENCOUNTER — Encounter (HOSPITAL_COMMUNITY): Payer: Self-pay | Admitting: Psychiatry

## 2013-04-02 DIAGNOSIS — F329 Major depressive disorder, single episode, unspecified: Secondary | ICD-10-CM | POA: Diagnosis present

## 2013-04-02 DIAGNOSIS — F1020 Alcohol dependence, uncomplicated: Secondary | ICD-10-CM | POA: Diagnosis present

## 2013-04-02 DIAGNOSIS — F4010 Social phobia, unspecified: Secondary | ICD-10-CM | POA: Diagnosis present

## 2013-04-02 MED ORDER — DULOXETINE HCL 30 MG PO CPEP
30.0000 mg | ORAL_CAPSULE | Freq: Every day | ORAL | Status: DC
  Administered 2013-04-02 – 2013-04-03 (×2): 30 mg via ORAL
  Filled 2013-04-02: qty 1
  Filled 2013-04-02: qty 14
  Filled 2013-04-02 (×4): qty 1

## 2013-04-02 NOTE — [facility] Group Notes (Signed)
[facility] LCSW Group Therapy  04/02/2013 4:11 PM  Type of Therapy:  Group Therapy  Participation Level:  Active  Participation Quality:  Attentive  Affect:  Appropriate  Cognitive:  Alert  Insight:  Improving  Engagement in Therapy:  Improving  Modes of Intervention:  Discussion, Education, Exploration, Socialization and Support  Summary of Progress/Problems:  Finding Balance in Life. Today's group focused on defining balance in one's own words, identifying things that can knock one off balance, and exploring healthy ways to maintain balance in life. Group members were asked to provide an example of a time when they felt off balance, describe how they handled that situation,and process healthier ways to regain balance in the future. Group members were asked to share the most important tool for maintaining balance that they learned while at [facility] and how they plan to apply this method after discharge. Linas was attentive and engaged throughout today's group. He stated that he fell off balance due to "not going to appts or therapy, holding in anxiety/anger, and letting problems build up without addressing them." [name] stated that he plans to take meds for depression "that is something I have never been open to in the past", go to therapy appts and AA meetings for "maintenance." Matt identified "my friends" as triggers due to their alcohol problems. [name] shows progress in the group setting and improving insight AEB his ability to actively participate in group discussion and identify both triggers/stressors and ways to reestablish a sense of balance in his life.    Smart, Kirin Pastorino 04/02/2013, 4:11 PM

## 2013-04-02 NOTE — [facility] Suicide Risk Assessment (Signed)
Suicide Risk Assessment  Admission Assessment     Nursing information obtained from:  Patient Demographic factors:  Male;Caucasian;Access to firearms Current Mental Status:  Self-harm thoughts Loss Factors:  NA Historical Factors:  Prior suicide attempts;Family history of mental illness or substance abuse Risk Reduction Factors:  Responsible for children under 29 years of age;Employed;Positive social support  CLINICAL FACTORS:   Depression:   Comorbid alcohol abuse/dependence Alcohol/Substance Abuse/Dependencies  COGNITIVE FEATURES THAT CONTRIBUTE TO RISK:  Polarized thinking Thought constriction (tunnel vision)    SUICIDE RISK:   Moderate:  Frequent suicidal ideation with limited intensity, and duration, some specificity in terms of plans, no associated intent, good self-control, limited dysphoria/symptomatology, some risk factors present, and identifiable protective factors, including available and accessible social support.  PLAN OF CARE: Supportive approach/coping skills/relapse prevention                               [facility]rium detox                               Trial with Cymbalta 30 mg  I certify that inpatient services furnished can reasonably be expected to improve the patient's condition.  [name] A 04/02/2013, 4:39 PM

## 2013-04-02 NOTE — [facility] Suicide Risk Assessment (Signed)
[facility] INPATIENT: Family/Significant Other Suicide Prevention Education  Suicide Prevention Education:  Education Completed; No one has been identified by the patient as the family member/significant other with whom the patient will be residing, and identified as the person(s) who will aid the patient in the event of a mental health crisis (suicidal ideations/suicide attempt).   Pt did not c/o SI at admission, nor have they endorsed SI during their stay here. SPE not required. SPI pamphlet given to pt and he was encouraged to share with support system.   [employer], LCSWA 04/02/2013 12:49 PM
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
## 2013-04-02 NOTE — Tx Team (Signed)
Initial Interdisciplinary Treatment Plan  PATIENT STRENGTHS: (choose at least two) Ability for insight Average or above average intelligence Capable of independent living General fund of knowledge Physical Health Supportive family/friends  PATIENT STRESSORS: Substance abuse   PROBLEM LIST: Problem List/Patient Goals Date to be addressed Date deferred Reason deferred Estimated date of resolution  depression 04/01/2013     Alcohol dependence 04/01/2013     anxiety 04/01/2013                                          DISCHARGE CRITERIA:  Ability to meet basic life and health needs Adequate post-discharge living arrangements Improved stabilization in mood, thinking, and/or behavior Withdrawal symptoms are absent or subacute and managed without 24-hour nursing intervention  PRELIMINARY DISCHARGE PLAN: Attend aftercare/continuing care group Attend PHP/IOP Attend 12-step recovery group Outpatient therapy Return to previous living arrangement  PATIENT/FAMIILY INVOLVEMENT: This treatment plan has been presented to and reviewed with the patient, [name], and/or family member,  The patient and family have been given the opportunity to ask questions and make suggestions.  [name], [name] 04/02/2013, 5:12 AM

## 2013-04-02 NOTE — Progress Notes (Signed)
D:Pt reports withdrawal symptoms of tremors, anxiety, head fullness, and sweats. Pt is taking medications as ordered for withdrawal and drinking Gatorade. A:Offered support, encouragement and 15 minute checks. R:Pt denies si and hi. Safety maintained on the unit.
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
## 2013-04-03 DIAGNOSIS — F1020 Alcohol dependence, uncomplicated: Secondary | ICD-10-CM

## 2013-04-03 DIAGNOSIS — F321 Major depressive disorder, single episode, moderate: Secondary | ICD-10-CM

## 2013-04-03 DIAGNOSIS — F10239 Alcohol dependence with withdrawal, unspecified: Principal | ICD-10-CM

## 2013-04-03 MED ORDER — TRAZODONE HCL 50 MG PO TABS: 50.0000 mg | ORAL_TABLET | Freq: Every evening | ORAL | Status: DC | PRN

## 2013-04-03 MED ORDER — DULOXETINE HCL 30 MG PO CPEP: 30.0000 mg | ORAL_CAPSULE | Freq: Every day | ORAL | Status: DC

## 2013-04-03 MED ORDER — CHLORDIAZEPOXIDE HCL 25 MG PO CAPS
25.0000 mg | ORAL_CAPSULE | Freq: Every day | ORAL | Status: DC
  Filled 2013-04-03: qty 4

## 2013-04-03 MED ORDER — CHLORDIAZEPOXIDE HCL 25 MG PO CAPS: ORAL_CAPSULE | ORAL | Status: DC

## 2013-04-03 NOTE — BHH Group Notes (Signed)
[facility] LCSW Aftercare Discharge Planning Group Note   04/03/2013 11:13 AM  Participation Quality:  Appropriate   Mood/Affect:  Appropriate  Depression Rating:  0  Anxiety Rating:  0  Thoughts of Suicide:  No Will you contract for safety?   NA  Current AVH:  No  Plan for Discharge/Comments:  Pt reports that he feels great today and is ready to d/c. He asked for note for work stating that he had been in the hospital and is able to return to work on Tuesday of next week. Pt has followup scheduled with [facility].   Transportation Means: girlfriend   Supports: girlfriend and friends. Limited/no family supports.   Smart, [employer]

## 2013-04-03 NOTE — Discharge Summary (Signed)
Physician Discharge Summary Note  Patient:  [name] is an 27 y.o., male MRN:  [id number] DOB:  1986-03-26 Patient phone:  [phone] (home)  Patient address:   [address]  [place] [address],   Date of Admission:  04/01/2013 Date of Discharge: 04/03/13  Reason for Admission:  Alcohol detox  Discharge Diagnoses: Active Problems:   Alcohol dependence   Depressive disorder, not elsewhere classified   Social anxiety disorder  Review of Systems  Constitutional: Negative.   HENT: Negative.   Eyes: Negative.   Respiratory: Negative.   Cardiovascular: Negative.   Gastrointestinal: Negative.   Genitourinary: Negative.   Musculoskeletal: Negative.   Skin: Negative.   Neurological: Negative.   Endo/Heme/Allergies: Negative.   Psychiatric/Behavioral: Positive for depression (Stabilized with medication prior to discharge) and substance abuse (Alcoholism). Negative for suicidal ideas, hallucinations and memory loss. The patient is nervous/anxious (Stabilied with medication prior to discharge) and has insomnia (Stabilized with medication prior to discharge).    DSM5: Schizophrenia Disorders:  NA Obsessive-Compulsive Disorders:  NA Trauma-Stressor Disorders:  NA Substance/Addictive Disorders:  Alcohol Withdrawal (291.81), Alcohol dependence Depressive Disorders:  Major Depressive Disorder - Moderate (296.22)  Axis Diagnosis:  AXIS I:  Alcohol Withdrawal (291.81), Alcohol dependence, major depressive disorder, moderate AXIS II:  Deferred AXIS III:   Past Medical History  Diagnosis Date  . Alcohol abuse   . Depression   . Hypertension    AXIS IV:  other psychosocial or environmental problems and Alcoholism AXIS V:  63  Level of Care:  OP  Hospital Course:  27 Y/O male who states that he was here a year and a half ago. Was sober until month and a half ago. Was renting a house some things went wrong. Mold was an issue. Had to leave. Had to take the landlord to  court. Move with girlfriends family. States that this set the stage for a realpse. States he drinks 6 pack a day 12-16 ounces. Stop a week ago for four days got sick. Recognized it was out of control requested help with detox.  Matthews's stay in this hospital was brief. He came requesting alcohol detox. After admission assessment/evaluation coupled with UDS/Toxicology results, it was obvious that Feliz Beam was intoxicated, presenting with acute withdrawal symptoms that required complete administration of Librium detoxification treatment protocol. He was then started on Librium detoxification treatment protocol for alcohol detoxification and to combat any anxiety related withdrawal symptoms that he may present. He also received Duloxetin 30 mg daily for depression and Trazodone 50 mg Q bedtime for sleep . Halim presented no other medical issues and or concerns that required treatment and or monitoring. And because his acute withdrawal symptoms was responding to the Librium protocol administration, Danielle decided to be discharged from the hospital to follow-up substance abuse care on outpatient basis. It was decided that he will continue substance abuse treatment,counselingg sessions at the [facility].   He is currently being discharged to follow-up care at the [facility] here in [place], [place] on 04/09/13 at 1:30 PM. The address and contact information for this clinic provided for patient in writng. Upon discharge, patient adamantly denies any suicidal, homicidal ideations, auditory, visual hallucinations, delusional thoughts, paranoia and or withdrawal symptoms. He was provided 14 days worth supply samples of his [facility] discharge medications and 6 capsules of Librium to complete his detoxification protocol at home. He left [facility] with all personal belongings in no apparent distress. Transportation per girlfriend.   Consults:  psychiatry  Significant Diagnostic  Studies:  labs: CBC with diff, CMP, UDS, Toxicology tests, U/A  Discharge Vitals:   Blood pressure 145/98, pulse 89, temperature 97.1 F (36.2 C), temperature source Oral, resp. rate 16, height 5\' 6"  (1.676 m), weight 63.504 kg (140 lb). Body mass index is 22.61 kg/(m^2). Lab Results:   No results found for this or any previous visit (from the past 72 hour(s)).  Physical Findings: AIMS: Facial and Oral Movements Muscles of Facial Expression: None, normal Lips and Perioral Area: None, normal Jaw: None, normal Tongue: None, normal,Extremity Movements Upper (arms, wrists, hands, fingers): None, normal Lower (legs, knees, ankles, toes): None, normal, Trunk Movements Neck, shoulders, hips: None, normal, Overall Severity Severity of abnormal movements (highest score from questions above): None, normal Incapacitation due to abnormal movements: None, normal, Dental Status Current problems with teeth and/or dentures?: No Does patient usually wear dentures?: No  CIWA:  CIWA-Ar Total: 1 COWS:     Psychiatric Specialty Exam: See Psychiatric Specialty Exam and Suicide Risk Assessment completed by Attending Physician prior to discharge.  Discharge destination:  Home  Is patient on multiple antipsychotic therapies at discharge:  No   Has Patient had three or more failed trials of antipsychotic monotherapy by history:  No  Recommended Plan for Multiple Antipsychotic Therapies: NA     Medication List    STOP taking these medications       ibuprofen 200 MG tablet  Commonly known as:  ADVIL,MOTRIN      TAKE these medications     Indication   chlordiazePOXIDE 25 MG capsule  Commonly known as:  LIBRIUM  - Take 1 capsule three times daily for alcohol detox,  - Take 1 capsule two times daily for alcohol detox,  - Take 1 capsule 1 time daily for alcohol detox   Indication:  Acute Alcohol Withdrawal Syndrome, Feeling Anxious     DULoxetine 30 MG capsule  Commonly known as:  CYMBALTA   Take 1 capsule (30 mg total) by mouth daily. For depression   Indication:  Major Depressive Disorder     traZODone 50 MG tablet  Commonly known as:  DESYREL  Take 1 tablet (50 mg total) by mouth at bedtime as needed and may repeat dose one time if needed for sleep. For sleep   Indication:  Trouble Sleeping       Follow-up Information   Follow up with [facility]  On 04/09/2013. (Appt. at 1:30PM for hospital followup/assessment for therapy services. )    Contact information:   [address] [place], [place] [address] Phone: [phone] Fax: [phone]    Follow-up recommendations:  Activity:  As tolerated Diet: As recommended by your primary care doctor. Keep all scheduled follow-up appointments as recommended. Continue to work your relapse prevention plan Comments:  Take all your medications as prescribed by your mental healthcare provider. Report any adverse effects and or reactions from your medicines to your outpatient provider promptly. Patient is instructed and cautioned to not engage in alcohol and or illegal drug use while on prescription medicines. In the event of worsening symptoms, patient is instructed to call the crisis hotline, 911 and or go to the nearest ED for appropriate evaluation and treatment of symptoms. Follow-up with your primary care provider for your other medical issues, concerns and or health care needs.   Total Discharge Time:  Greater than 30 minutes.  Signed: Sanjuana Kava, PMHNP-BC 04/06/2013, 9:09 AM Agree with assessment and plan Reymundo Poll. Dub Mikes, M.D.

## 2013-04-03 NOTE — Progress Notes (Signed)
[facility] Adult Case Management Discharge Plan :  Will you be returning to the same living situation after discharge: Yes,  home with girlfriend  At discharge, do you have transportation home?:Yes,  girlfriend Do you have the ability to pay for your medications:Yes,  [employer]  Release of information consent forms completed and in the chart;  Patient's signature needed at discharge.  Patient to Follow up at: Follow-up Information   Follow up with [facility]  On 04/09/2013. (Appt. at 1:30PM for hospital followup/assessment for therapy services. )    Contact information:   [address] [place], [place] [address] Phone: [phone] Fax: [phone]      Patient denies SI/HI:   Yes,  during admission and stay at [facility]    Safety Planning and Suicide Prevention discussed:  Yes,  SPE not required for this pt. He was given SPI pamphlet and encouraged to share with his supports.   Smart, Parilee Hally 04/03/2013, 10:52 AM

## 2013-04-03 NOTE — Tx Team (Signed)
Interdisciplinary Treatment Plan Update (Adult)  Date: 04/03/2013   Time Reviewed: 10:47 AM  Progress in Treatment:  Attending groups: Yes Participating in groups:  Yes Taking medication as prescribed: Yes  Tolerating medication: Yes  Family/Significant othe contact made: No. SPE not required for this pt.   Patient understands diagnosis: Yes, AEB seeking treatment for ETOH detox and depression.  Discussing patient identified problems/goals with staff: Yes  Medical problems stabilized or resolved: Yes  Denies suicidal/homicidal ideation: Yes  During admission, group, and self report.  Patient has not harmed self or Others: Yes  New problem(s) identified: n/a   Discharge Plan or Barriers: Pt to followup at [facility] for med management and therapy.  Additional comments: 27 Y/O male who states that he was here a year and a half ago. Was sober until month and a half ago. Was renting a house some things went wrong. Mold was an issue. Had to leave. Had to take the landlord to court. Move with girlfriends family. States that this set the stage for a realpse. States he drinks 6 pack a day 12-16 ounces. Stop a week ago for four days got sick. Recognized it was out of control requested help with detox Reason for Continuation of Hospitalization: d/c today Estimated length of stay: d/c today For review of initial/current patient goals, please see plan of care.  Attendees:  Patient:    Family:    Physician: Geoffery Lyons MD 04/03/2013 10:49 AM   Nursing: Velna Hatchet RN  04/03/2013 10:49 AM   Clinical Social Worker Kaizley Aja Smart, LCSWA  04/03/2013 10:49 AM   Other: Aggie PA 04/03/2013 10:49 AM   Other: Darden Dates Nurse CM 04/03/2013 10:49 AM   Other: Massie Kluver, Community Care Coordinator  04/03/2013 10:49 AM   Other:    Scribe for Treatment Team:  [employer] LCSWA 04/03/2013 10:49 AM

## 2013-04-03 NOTE — Progress Notes (Signed)
Adult Psychoeducational Group Note  Date:  04/03/2013 Time:  11:39 AM  Group Topic/Focus:  Relapse Prevention Planning:   The focus of this group is to define relapse and discuss the need for planning to combat relapse.  Participation Level:  Minimal  Participation Quality:  Appropriate  Affect:  Appropriate  Cognitive:  Appropriate  Insight: Limited  Engagement in Group:  Improving  Modes of Intervention:  Discussion   [name] 04/03/2013, 11:39 AM

## 2013-04-03 NOTE — [facility] Suicide Risk Assessment (Signed)
Suicide Risk Assessment  Discharge Assessment     Demographic Factors:  Male and Caucasian  Mental Status Per Nursing Assessment::   On Admission:  Self-harm thoughts  Current Mental Status by Physician: In full contact with reality. There are no suicidal ideas, plans or intent. No active S/S of withdrawal. His mood is euthymic, his affect is appropriate. He is willing and motivated to pursue outpatient treatment. He is going to take few days off work and get his things together at home   Loss Factors: NA  Historical Factors: NA  Risk Reduction Factors:   Responsible for children under 50 years of age, Sense of responsibility to family, Employed, Living with another person, especially a relative and Positive social support  Continued Clinical Symptoms:  Alcohol/Substance Abuse/Dependencies  Cognitive Features That Contribute To Risk: No evidence   Suicide Risk:  Minimal: No identifiable suicidal ideation.  Patients presenting with no risk factors but with morbid ruminations; may be classified as minimal risk based on the severity of the depressive symptoms  Discharge Diagnoses:   AXIS I:  Alcohol Abuse, Depressive Disorder NOS and Social Anxiety AXIS II:  Deferred AXIS III:   Past Medical History  Diagnosis Date  . Alcohol abuse   . Depression   . Hypertension    AXIS IV:  other psychosocial or environmental problems AXIS V:  61-70 mild symptoms  Plan Of Care/Follow-up recommendations:  Activity:  as tolerated Diet:  regular Follow up outpatient basis Is patient on multiple antipsychotic therapies at discharge:  No   Has Patient had three or more failed trials of antipsychotic monotherapy by history:  No  Recommended Plan for Multiple Antipsychotic Therapies: NA  Shavar Gorka A 04/03/2013, 11:26 AM

## 2013-04-03 NOTE — Clinical Social Work Note (Signed)
Per pt request, CSW provided pt with excusat letter for work. (in chart).

## 2013-04-03 NOTE — Progress Notes (Signed)
Pt d/c from hospital with his girlfriend. All items returned. D/C instructions given, prescriptions given, and samples given. Pt denies si and hi.

## 2013-04-08 NOTE — Progress Notes (Signed)
Patient Discharge Instructions:  After Visit Summary (AVS):   Faxed to:  04/08/13 Discharge Summary Note:   Faxed to:  04/08/13 Psychiatric Admission Assessment Note:   Faxed to:  04/08/13 Suicide Risk Assessment - Discharge Assessment:   Faxed to:  04/08/13 Faxed/Sent to the Next Level Care provider:  04/08/13 Faxed to [facility] @ [phone]  Jerelene Redden, 04/08/2013, 1:20 PM

## 2013-10-15 ENCOUNTER — Encounter (HOSPITAL_COMMUNITY): Payer: Self-pay | Admitting: Emergency Medicine

## 2013-10-15 ENCOUNTER — Inpatient Hospital Stay (HOSPITAL_COMMUNITY)
Admission: EM | Admit: 2013-10-15 | Discharge: 2013-10-16 | DRG: 897 | Disposition: A | Discharge status: Home / Self Care | Payer: Federal, State, Local not specified - Other | Source: Intra-hospital | Attending: Psychiatry | Admitting: Psychiatry

## 2013-10-15 ENCOUNTER — Emergency Department (HOSPITAL_COMMUNITY)
Admission: EM | Admit: 2013-10-15 | Discharge: 2013-10-15 | Disposition: A | Discharge status: Other Acute Inpatient Hospital | Payer: Managed Care, Other (non HMO) | Attending: Emergency Medicine | Admitting: Emergency Medicine

## 2013-10-15 DIAGNOSIS — G4700 Insomnia, unspecified: Secondary | ICD-10-CM | POA: Diagnosis present

## 2013-10-15 DIAGNOSIS — S61509A Unspecified open wound of unspecified wrist, initial encounter: Secondary | ICD-10-CM | POA: Insufficient documentation

## 2013-10-15 DIAGNOSIS — F1020 Alcohol dependence, uncomplicated: Principal | ICD-10-CM | POA: Diagnosis present

## 2013-10-15 DIAGNOSIS — F3289 Other specified depressive episodes: Secondary | ICD-10-CM | POA: Diagnosis present

## 2013-10-15 DIAGNOSIS — F4010 Social phobia, unspecified: Secondary | ICD-10-CM

## 2013-10-15 DIAGNOSIS — IMO0002 Reserved for concepts with insufficient information to code with codable children: Secondary | ICD-10-CM

## 2013-10-15 DIAGNOSIS — I10 Essential (primary) hypertension: Secondary | ICD-10-CM | POA: Diagnosis present

## 2013-10-15 DIAGNOSIS — F411 Generalized anxiety disorder: Secondary | ICD-10-CM | POA: Diagnosis present

## 2013-10-15 DIAGNOSIS — X789XXA Intentional self-harm by unspecified sharp object, initial encounter: Secondary | ICD-10-CM | POA: Insufficient documentation

## 2013-10-15 DIAGNOSIS — F329 Major depressive disorder, single episode, unspecified: Secondary | ICD-10-CM | POA: Diagnosis present

## 2013-10-15 DIAGNOSIS — S31109A Unspecified open wound of abdominal wall, unspecified quadrant without penetration into peritoneal cavity, initial encounter: Secondary | ICD-10-CM | POA: Insufficient documentation

## 2013-10-15 DIAGNOSIS — S21109A Unspecified open wound of unspecified front wall of thorax without penetration into thoracic cavity, initial encounter: Secondary | ICD-10-CM | POA: Insufficient documentation

## 2013-10-15 DIAGNOSIS — F32A Depression, unspecified: Secondary | ICD-10-CM

## 2013-10-15 DIAGNOSIS — Z79899 Other long term (current) drug therapy: Secondary | ICD-10-CM | POA: Insufficient documentation

## 2013-10-15 DIAGNOSIS — F1994 Other psychoactive substance use, unspecified with psychoactive substance-induced mood disorder: Secondary | ICD-10-CM | POA: Diagnosis present

## 2013-10-15 DIAGNOSIS — F1010 Alcohol abuse, uncomplicated: Secondary | ICD-10-CM | POA: Insufficient documentation

## 2013-10-15 DIAGNOSIS — E569 Vitamin deficiency, unspecified: Secondary | ICD-10-CM | POA: Diagnosis present

## 2013-10-15 DIAGNOSIS — R45851 Suicidal ideations: Secondary | ICD-10-CM | POA: Insufficient documentation

## 2013-10-15 LAB — COMPREHENSIVE METABOLIC PANEL
ALK PHOS: 68 U/L (ref 39–117)
ALT: 65 U/L — AB (ref 0–53)
AST: 43 U/L — ABNORMAL HIGH (ref 0–37)
Albumin: 4.6 g/dL (ref 3.5–5.2)
BUN: 7 mg/dL (ref 6–23)
CALCIUM: 9.7 mg/dL (ref 8.4–10.5)
CO2: 27 meq/L (ref 19–32)
Chloride: 101 mEq/L (ref 96–112)
Creatinine, Ser: 0.91 mg/dL (ref 0.50–1.35)
GLUCOSE: 115 mg/dL — AB (ref 70–99)
Potassium: 4 mEq/L (ref 3.7–5.3)
SODIUM: 143 meq/L (ref 137–147)
TOTAL PROTEIN: 8.2 g/dL (ref 6.0–8.3)
Total Bilirubin: <0.2 mg/dL — ABNORMAL LOW (ref 0.3–1.2)

## 2013-10-15 LAB — RAPID URINE DRUG SCREEN, HOSP PERFORMED
AMPHETAMINES: NOT DETECTED
BARBITURATES: NOT DETECTED
Benzodiazepines: NOT DETECTED
Cocaine: NOT DETECTED
Opiates: NOT DETECTED
TETRAHYDROCANNABINOL: NOT DETECTED

## 2013-10-15 LAB — ACETAMINOPHEN LEVEL: Acetaminophen (Tylenol), Serum: <15 ug/mL (ref 10–30)

## 2013-10-15 LAB — ETHANOL: ALCOHOL ETHYL (B): 281 mg/dL — AB (ref 0–11)

## 2013-10-15 LAB — CBC
HEMATOCRIT: 45.4 % (ref 39.0–52.0)
HEMOGLOBIN: 16.3 g/dL (ref 13.0–17.0)
MCH: 31 pg (ref 26.0–34.0)
MCHC: 35.9 g/dL (ref 30.0–36.0)
MCV: 86.3 fL (ref 78.0–100.0)
Platelets: 229 10*3/uL (ref 150–400)
RBC: 5.26 MIL/uL (ref 4.22–5.81)
RDW: 11.9 % (ref 11.5–15.5)
WBC: 7.4 10*3/uL (ref 4.0–10.5)

## 2013-10-15 LAB — SALICYLATE LEVEL: Salicylate Lvl: <2 mg/dL — ABNORMAL LOW (ref 2.8–20.0)

## 2013-10-15 MED ORDER — ACETAMINOPHEN 325 MG PO TABS
650.0000 mg | ORAL_TABLET | Freq: Once | ORAL | Status: AC
  Administered 2013-10-15: 650 mg via ORAL
  Filled 2013-10-15: qty 2

## 2013-10-15 MED ORDER — ZOLPIDEM TARTRATE 5 MG PO TABS: 5.0000 mg | ORAL_TABLET | Freq: Every evening | ORAL | Status: DC | PRN

## 2013-10-15 MED ORDER — CHLORDIAZEPOXIDE HCL 25 MG PO CAPS: 25.0000 mg | ORAL_CAPSULE | Freq: Every day | ORAL | Status: DC

## 2013-10-15 MED ORDER — DULOXETINE HCL 30 MG PO CPEP
30.0000 mg | ORAL_CAPSULE | Freq: Every day | ORAL | Status: DC
  Filled 2013-10-15: qty 1

## 2013-10-15 MED ORDER — IBUPROFEN 200 MG PO TABS: 600.0000 mg | ORAL_TABLET | Freq: Three times a day (TID) | ORAL | Status: DC | PRN

## 2013-10-15 MED ORDER — ACETAMINOPHEN 325 MG PO TABS: 650.0000 mg | ORAL_TABLET | Freq: Four times a day (QID) | ORAL | Status: DC | PRN

## 2013-10-15 MED ORDER — NICOTINE 21 MG/24HR TD PT24: 21.0000 mg | MEDICATED_PATCH | Freq: Every day | TRANSDERMAL | Status: DC

## 2013-10-15 MED ORDER — CHLORDIAZEPOXIDE HCL 25 MG PO CAPS
25.0000 mg | ORAL_CAPSULE | Freq: Four times a day (QID) | ORAL | Status: AC
  Administered 2013-10-15 – 2013-10-16 (×4): 25 mg via ORAL
  Filled 2013-10-15 (×4): qty 1

## 2013-10-15 MED ORDER — VITAMIN B-1 100 MG PO TABS
100.0000 mg | ORAL_TABLET | Freq: Every day | ORAL | Status: DC
  Administered 2013-10-16: 100 mg via ORAL
  Filled 2013-10-15 (×2): qty 1

## 2013-10-15 MED ORDER — MAGNESIUM HYDROXIDE 400 MG/5ML PO SUSP: 30.0000 mL | Freq: Every day | ORAL | Status: DC | PRN

## 2013-10-15 MED ORDER — CARBAMAZEPINE ER 200 MG PO TB12
200.0000 mg | ORAL_TABLET | Freq: Every day | ORAL | Status: DC
  Administered 2013-10-15: 200 mg via ORAL
  Filled 2013-10-15 (×2): qty 1

## 2013-10-15 MED ORDER — CHLORDIAZEPOXIDE HCL 25 MG PO CAPS
25.0000 mg | ORAL_CAPSULE | Freq: Once | ORAL | Status: AC
  Administered 2013-10-15: 25 mg via ORAL
  Filled 2013-10-15: qty 1

## 2013-10-15 MED ORDER — ADULT MULTIVITAMIN W/MINERALS CH
1.0000 | ORAL_TABLET | Freq: Every day | ORAL | Status: DC
  Administered 2013-10-15 – 2013-10-16 (×2): 1 via ORAL
  Filled 2013-10-15 (×3): qty 1

## 2013-10-15 MED ORDER — CHLORDIAZEPOXIDE HCL 25 MG PO CAPS: 25.0000 mg | ORAL_CAPSULE | Freq: Four times a day (QID) | ORAL | Status: DC | PRN

## 2013-10-15 MED ORDER — ONDANSETRON HCL 4 MG PO TABS: 4.0000 mg | ORAL_TABLET | Freq: Three times a day (TID) | ORAL | Status: DC | PRN

## 2013-10-15 MED ORDER — TRAZODONE HCL 50 MG PO TABS: 50.0000 mg | ORAL_TABLET | Freq: Every evening | ORAL | Status: DC | PRN

## 2013-10-15 MED ORDER — LOPERAMIDE HCL 2 MG PO CAPS: 2.0000 mg | ORAL_CAPSULE | ORAL | Status: DC | PRN

## 2013-10-15 MED ORDER — TRAZODONE HCL 50 MG PO TABS
50.0000 mg | ORAL_TABLET | Freq: Every evening | ORAL | Status: DC | PRN
  Administered 2013-10-15: 50 mg via ORAL
  Filled 2013-10-15: qty 1
  Filled 2013-10-15: qty 28

## 2013-10-15 MED ORDER — CHLORDIAZEPOXIDE HCL 25 MG PO CAPS
25.0000 mg | ORAL_CAPSULE | Freq: Three times a day (TID) | ORAL | Status: DC
  Administered 2013-10-16: 25 mg via ORAL
  Filled 2013-10-15: qty 1

## 2013-10-15 MED ORDER — BUPROPION HCL ER (XL) 300 MG PO TB24
300.0000 mg | ORAL_TABLET | Freq: Every day | ORAL | Status: DC
  Administered 2013-10-15 – 2013-10-16 (×2): 300 mg via ORAL
  Filled 2013-10-15 (×3): qty 1

## 2013-10-15 MED ORDER — THIAMINE HCL 100 MG/ML IJ SOLN: 100.0000 mg | Freq: Once | INTRAMUSCULAR | Status: DC

## 2013-10-15 MED ORDER — ALUM & MAG HYDROXIDE-SIMETH 200-200-20 MG/5ML PO SUSP: 30.0000 mL | ORAL | Status: DC | PRN

## 2013-10-15 MED ORDER — ADULT MULTIVITAMIN W/MINERALS CH
1.0000 | ORAL_TABLET | Freq: Every day | ORAL | Status: DC
  Filled 2013-10-15 (×2): qty 1

## 2013-10-15 MED ORDER — CHLORDIAZEPOXIDE HCL 25 MG PO CAPS: 25.0000 mg | ORAL_CAPSULE | ORAL | Status: DC

## 2013-10-15 MED ORDER — ONDANSETRON 4 MG PO TBDP
4.0000 mg | ORAL_TABLET | Freq: Four times a day (QID) | ORAL | Status: DC | PRN
  Administered 2013-10-15: 4 mg via ORAL
  Filled 2013-10-15: qty 1

## 2013-10-15 MED ORDER — HYDROXYZINE HCL 25 MG PO TABS
25.0000 mg | ORAL_TABLET | Freq: Four times a day (QID) | ORAL | Status: DC | PRN
  Filled 2013-10-15: qty 30

## 2013-10-15 NOTE — ED Notes (Signed)
Pt transferred to psych hold for evaluation for psychiatric admission. Patient is a 28 year old male with history of depression and alcohol abuse who presents in [employer] custody under IVC papers for depression and suicidal ideations. Pt reported girlfriend called [employer] after he started cutting on himself and wanting to die. Patient reports history of alcohol abuse and was recently sober for several months but began drinking again. States he is depressed and feeling suicidal. Does admit to cutting his left wrist and lower chest and abdomen with a razor blade. Patient states he lost his job and started back drinking a few weeks. Patient is requesting admission to psychiatry for help by Dr. Dub MikesLugo. He states Dr. Dub MikesLugo treated him inpatient and outpatient. He denies any other complaints.

## 2013-10-15 NOTE — Progress Notes (Signed)
Recreation Therapy Notes  Animal-Assisted Activity/Therapy (AAA/T) Program Checklist/Progress Notes Patient Eligibility Criteria Checklist & Daily Group note for Rec Tx Intervention  Date: 03.26.2015 Time: 2:45pm Location: [address] Dayroom    AAA/T Program Assumption of Risk Form signed by Patient/ or Parent Legal Guardian yes  Patient is free of allergies or sever asthma yes  Patient reports no fear of animals yes  Patient reports no history of cruelty to animals yes   Patient understands his/her participation is voluntary yes  Behavioral Response: DID NOT ATTEND.   [name] L Lariah Fleer, LRT/CTRS  Lyndal Reggio L 10/15/2013 4:38 PM 

## 2013-10-15 NOTE — ED Notes (Signed)
Called [employer] for transport. 

## 2013-10-15 NOTE — ED Notes (Signed)
Pt belongings are in belongings bag, behind triage desk.

## 2013-10-15 NOTE — [facility] Suicide Risk Assessment (Signed)
   Nursing information obtained from:    Demographic factors:    Current Mental Status:    Loss Factors:    Historical Factors:    Risk Reduction Factors:    Total Time spent with patient: 45 minutes  CLINICAL FACTORS:   Depression:   Anhedonia Comorbid alcohol abuse/dependence Hopelessness Impulsivity Insomnia Recent sense of peace/wellbeing Severe Alcohol/Substance Abuse/Dependencies  Psychiatric Specialty Exam: Physical Exam  ROS  Blood pressure 118/82, pulse 114, temperature 98.6 F (37 C), temperature source Oral, resp. rate 18, height 5\' 9"  (1.753 m), weight 70.761 kg (156 lb), SpO2 98.00%.Body mass index is 23.03 kg/(m^2).  General Appearance: Disheveled  Eye SolicitorContact::  Fair  Speech:  Clear and Coherent and Slow  Volume:  Decreased  Mood:  Anxious, Depressed, Hopeless and Worthless  Affect:  Depressed and Flat  Thought Process:  Goal Directed and Intact  Orientation:  Full (Time, Place, and Person)  Thought Content:  Rumination  Suicidal Thoughts:  No  Homicidal Thoughts:  No  Memory:  Immediate;   Fair  Judgement:  Intact  Insight:  Fair  Psychomotor Activity:  Psychomotor Retardation  Concentration:  Fair  Recall:  Fair  Fund of Knowledge:Good  Language: Good  Akathisia:  NA  Handed:  Right  AIMS (if indicated):     Assets:  Communication Skills Desire for Improvement Financial Resources/Insurance Housing Intimacy Leisure Time Physical Health Resilience Social Support Talents/Skills Transportation  Sleep:      Musculoskeletal: Strength & Muscle Tone: within normal limits Gait & Station: normal Patient leans: N/A  COGNITIVE FEATURES THAT CONTRIBUTE TO RISK:  Closed-mindedness Loss of executive function Polarized thinking Thought constriction (tunnel vision)    SUICIDE RISK:   Moderate:  Frequent suicidal ideation with limited intensity, and duration, some specificity in terms of plans, no associated intent, good self-control, limited  dysphoria/symptomatology, some risk factors present, and identifiable protective factors, including available and accessible social support.  PLAN OF CARE: Admit for crisis stabilization, safety margin and medication management for alcohol detox treatment and depression.  I certify that inpatient services furnished can reasonably be expected to improve the patient's condition.  Zi Sek,JANARDHAHA R. 10/15/2013, 1:20 PM

## 2013-10-15 NOTE — ED Provider Notes (Signed)
Medical screening examination/treatment/procedure(s) were performed by non-physician practitioner and as supervising physician I was immediately available for consultation/collaboration.   EKG Interpretation None        Hydee Fleece, MD 10/15/13 2030 

## 2013-10-15 NOTE — ED Notes (Signed)
Pt brought to ED by [employer] from home after cutting wrist (superficial wound) and chest with razor blade. Pt states he is an alcoholic and began drinking today for unkn reason. IVC papers obtained by [employer]

## 2013-10-15 NOTE — Progress Notes (Signed)
Writer consulted with the PA-C, Donell SievertSpencer Simon regarding the patient.  Per Donell SievertSpencer Simon the patient [facility] criteria [facility]  [facility] Writer spoke to the nurse and the ER MD Dr. Anitra LauthPlunkett regarding the patients disposition.  Writer will arrange transportation with the sheriff.    Writer completed the demographic information for the patients First Examination

## 2013-10-15 NOTE — Progress Notes (Signed)
Pt. Is a 28 year old male brought in by [employer] under involuntary commitment due to his alcohol abuse,  depression and SI.  Pt. Made some superficial cuts before he was stopped and brought to the ED.  Pt. Reported that he lost his job which was his main trigger to start drinking.  Pt. Is received awake, alert and oriented.  Pt. Oriented to the unit and contracts for safety while on the unit.  Pt. States he is feeling "ok at the moment", but slightly nauseated.  [facility] and is resting comfortably.  Pt. Remains on q15 minute observation for safety.

## 2013-10-15 NOTE — Progress Notes (Signed)
D Pt. Denies SI and HI,  No complaints of pain or discomfort noted  A Writer offered support and encouragement.    R [facility]  Pt. Reports he has been inpatient before and knows the rules etc.  Pt. Remains safe on the unit.  Is active in the milieu.

## 2013-10-15 NOTE — ED Notes (Addendum)
[facility]. The nurse giving medication and will call me back.

## 2013-10-15 NOTE — [facility] Group Notes (Signed)
[facility] LCSW Group Therapy  10/15/2013 3:01 PM  Type of Therapy:  Group Therapy  Participation Level:  Did Not Attend-pt new arrival on unit. DID NOT ATTEND, as he was being oriented to unit.   Smart, Vidal Lampkins LCSWA  10/15/2013, 3:01 PM

## 2013-10-15 NOTE — Progress Notes (Signed)
Patient did not attend the evening karaoke group. Pt remained in his room during group time.

## 2013-10-15 NOTE — ED Notes (Signed)
[facility], pt can leave his gauged earrings in at this time.

## 2013-10-15 NOTE — Progress Notes (Signed)
0900 orientation group    The focus of this group is to educate the patient on the purpose and policies of crisis stabilization and provide a format to answer questions about their admission.  The group details unit policies and expectations of patients while admitted.   Pt did not attend. 

## 2013-10-15 NOTE — ED Provider Notes (Signed)
CSN: [id number]     Arrival date & time 10/15/13  0114 History   First MD Initiated Contact with Patient 10/15/13 0205     Chief Complaint  Patient presents with  . Medical Clearance   HPI  History provided by the patient's and [employer]. Patient is a 28 year old male with history of depression, alcohol abuse and hypertension who presents in [employer] custody under IVC papers for depression and suicidal ideations. Patient reports history of alcohol abuse states he was recently sober for several months but began drinking again. States he is depressed and feeling suicidal. Does admit to cutting his left wrist and lower chest and abdomen with a razor blade. Patient does not wish to talk about what is causing his depression. He states he has other thoughts about suicide and plans but does not state what they are. Patient does say that he wishes to go to [facility] to speak with Dr. Dub Mikes. He denies any other complaints.    Past Medical History  Diagnosis Date  . Alcohol abuse   . Depression   . Hypertension    Past Surgical History  Procedure Laterality Date  . Fracture surgery     No family history on file. History  Substance Use Topics  . Smoking status: Never Smoker   . Smokeless tobacco: Not on file  . Alcohol Use: 0.0 oz/week    12-18 Cans of beer per week     Comment: heavy drinker    Review of Systems  All other systems reviewed and are negative.      Allergies  Cashew nut oil  Home Medications   Current Outpatient Rx  Name  Route  Sig  Dispense  Refill  . chlordiazePOXIDE (LIBRIUM) 25 MG capsule      Take 1 capsule three times daily for alcohol detox, Take 1 capsule two times daily for alcohol detox, Take 1 capsule 1 time daily for alcohol detox   6 capsule   0   . DULoxetine (CYMBALTA) 30 MG capsule   Oral   Take 1 capsule (30 mg total) by mouth daily. For depression   30 capsule   0   . traZODone (DESYREL) 50 MG tablet   Oral   Take 1 tablet (50 mg total) by mouth at  bedtime as needed and may repeat dose one time if needed for sleep. For sleep   60 tablet   0    BP 142/107  Pulse 125  Temp(Src) 99 F (37.2 C) (Oral)  Resp 16  Ht 5\' 10"  (1.778 m)  Wt 160 lb (72.576 kg)  BMI 22.96 kg/m2  SpO2 98% Physical Exam  Nursing note and vitals reviewed. Constitutional: He is oriented to person, place, and time. He appears well-developed and well-nourished. No distress.  HENT:  Head: Normocephalic.  Cardiovascular: Normal rate and regular rhythm.   Pulmonary/Chest: Effort normal and breath sounds normal. No respiratory distress. He has no wheezes. He has no rales.  Abdominal: Soft. There is no tenderness. There is no rebound.  Neurological: He is alert and oriented to person, place, and time.  Skin: Skin is warm.  Small superficial lacerations to the anterior left wrist and lower chest and abdomen. No gaping wounds. No bleeding.  Psychiatric: He exhibits a depressed mood. He expresses suicidal ideation. He expresses suicidal plans.    ED Course  Procedures   DIAGNOSTIC STUDIES: Oxygen Saturation is 98% on room air.    COORDINATION OF CARE:  Nursing notes reviewed. Vital signs reviewed.  Initial pt interview and examination performed.   2:55 AM-patient seen and evaluated. Patient continues to express depression and suicidal ideations. Stating that he would like to talk to Dr. Dub MikesLugo. He does not wish to talk about what is causing his depression. He does state he has been sober for several months but recently started back drinking. Denies any other drug use.  Patient with superficial lacerations to the left wrist and lower chest and abdomen area. No need for sutures or closure. Patient up-to-date on tetanus. He is medically cleared for further psychiatric evaluation  Psychiatric holding orders in place. TTS consult placed.   Treatment plan initiated: Medications  ibuprofen (ADVIL,MOTRIN) tablet 600 mg (not administered)  zolpidem (AMBIEN) tablet 5  mg (not administered)  nicotine (NICODERM CQ - dosed in mg/24 hours) patch 21 mg (not administered)  ondansetron (ZOFRAN) tablet 4 mg (not administered)  acetaminophen (TYLENOL) tablet 650 mg (650 mg Oral Given 10/15/13 0246)     Results for orders placed during the hospital encounter of 10/15/13  ACETAMINOPHEN LEVEL      Result Value Ref Range   Acetaminophen (Tylenol), Serum <15.0  10 - 30 ug/mL  CBC      Result Value Ref Range   WBC 7.4  4.0 - 10.5 K/uL   RBC 5.26  4.22 - 5.81 MIL/uL   Hemoglobin 16.3  13.0 - 17.0 g/dL   HCT 96.045.4  45.439.0 - 09.852.0 %   MCV 86.3  78.0 - 100.0 fL   MCH 31.0  26.0 - 34.0 pg   MCHC 35.9  30.0 - 36.0 g/dL   RDW 11.911.9  14.711.5 - 82.915.5 %   Platelets 229  150 - 400 K/uL  COMPREHENSIVE METABOLIC PANEL      Result Value Ref Range   Sodium 143  137 - 147 mEq/L   Potassium 4.0  3.7 - 5.3 mEq/L   Chloride 101  96 - 112 mEq/L   CO2 27  19 - 32 mEq/L   Glucose, Bld 115 (*) 70 - 99 mg/dL   BUN 7  6 - 23 mg/dL   Creatinine, Ser 5.620.91  0.50 - 1.35 mg/dL   Calcium 9.7  8.4 - 13.010.5 mg/dL   Total Protein 8.2  6.0 - 8.3 g/dL   Albumin 4.6  3.5 - 5.2 g/dL   AST 43 (*) 0 - 37 U/L   ALT 65 (*) 0 - 53 U/L   Alkaline Phosphatase 68  39 - 117 U/L   Total Bilirubin <0.2 (*) 0.3 - 1.2 mg/dL   GFR calc non Af Amer >90  >90 mL/min   GFR calc Af Amer >90  >90 mL/min  ETHANOL      Result Value Ref Range   Alcohol, Ethyl (B) 281 (*) 0 - 11 mg/dL  SALICYLATE LEVEL      Result Value Ref Range   Salicylate Lvl <2.0 (*) 2.8 - 20.0 mg/dL  URINE RAPID DRUG SCREEN (HOSP PERFORMED)      Result Value Ref Range   Opiates NONE DETECTED  NONE DETECTED   Cocaine NONE DETECTED  NONE DETECTED   Benzodiazepines NONE DETECTED  NONE DETECTED   Amphetamines NONE DETECTED  NONE DETECTED   Tetrahydrocannabinol NONE DETECTED  NONE DETECTED   Barbiturates NONE DETECTED  NONE DETECTED      MDM   Final diagnoses:  Depression  Suicidal ideation  Alcohol abuse  Laceration        Angus Sellereter  S Ervin Hensley, PA-C 10/15/13 [phone]

## 2013-10-15 NOTE — Progress Notes (Signed)
[facility] did not get to see patient but will be sending information about [facility], using the address provided.  °

## 2013-10-15 NOTE — ED Notes (Signed)
Per [facility]01 to Dr. Dub MikesLugo. [facility] and patient will be accepted after shift change [facility]. Pt resting in bed with eyes closed. Will continue to monitor closely and maintain safety.

## 2013-10-15 NOTE — H&P (Signed)
Psychiatric Admission Assessment Adult  Patient Identification:  [name]  Date of Evaluation:  10/15/2013  Chief Complaint:  Substance Induced Mood Disorder  History of Present Illness: 28 year old Caucasian male. He reports, I went to the [facility] last night. I need to stop drinking alcohol. I have been drinking heavily x 1 month. Was sober x 5 months, relapsed 1 month ago. Drink about 12 packs of beer daily. I don't really know why I drink so much. I guess I'm an alcoholic since the age of 53. I don't think I need substance abuse treatment. I'm on medication for depression, Wellbutrin XL. I have mood swings as well. Part of my medical hx includes multiple head injuries. I'm not suicidal. Used to hear voices and see things. My symptoms now include; Nausea, tremors.  Elements:  Location:  Alcoholism. Quality:  have been drinking excessively 1 month. Severity:  Severe, is unable to quit drinking. Timing:  Drinking worsened in the last 4 weeks. Duration:  Chronic. Context:  "I don't really know why i drink".  Associated Signs/Synptoms:  Depression Symptoms:  depressed mood, feelings of worthlessness/guilt, hopelessness, anxiety, insomnia,  (Hypo) Manic Symptoms:  Impulsivity,  Anxiety Symptoms:  Excessive Worry,  Psychotic Symptoms:  Hallucinations: Denies  PTSD Symptoms: Had a traumatic exposure:  Denies  Total Time spent with patient: 45 minutes  Psychiatric Specialty Exam: Physical Exam  Constitutional: He is oriented to person, place, and time. He appears well-developed.  HENT:  Head: Normocephalic.  Eyes: Pupils are equal, round, and reactive to light.  Neck: Normal range of motion.  Cardiovascular: Normal rate.   Respiratory: Effort normal.  GI: Soft.  Genitourinary:  Denies any issues in this area  Musculoskeletal: Normal range of motion.  Neurological: He is alert and oriented to person, place, and time.  Skin: Skin is warm and dry.   Psychiatric: His speech is normal and behavior is normal. Judgment and thought content normal. His mood appears anxious. Cognition and memory are normal. He exhibits a depressed mood.    Review of Systems  Constitutional: Positive for chills and malaise/fatigue.  HENT: Negative.   Eyes: Negative.   Respiratory: Negative.   Cardiovascular: Negative.   Gastrointestinal: Negative.   Genitourinary: Negative.   Musculoskeletal: Negative.   Skin: Negative.   Neurological: Positive for tremors and weakness.  Psychiatric/Behavioral: Positive for depression and substance abuse (Alcoholism). Negative for suicidal ideas, hallucinations and memory loss. The patient has insomnia. The patient is not nervous/anxious.     There were no vitals taken for this visit.There is no weight on file to calculate BMI.  General Appearance: Casual and several tattoos to arms  Eye Contact::  Fair  Speech:  Clear and Coherent  Volume:  Normal  Mood:  Anxious and Depressed  Affect:  Flat  Thought Process:  Coherent and Intact  Orientation:  Full (Time, Place, and Person)  Thought Content:  Admitted hx of auditory visual hallucinations  Suicidal Thoughts:  No  Homicidal Thoughts:  No  Memory:  Immediate;   Good Recent;   Good Remote;   Good  Judgement:  Fair  Insight:  Fair  Psychomotor Activity:  Normal  Concentration:  Good  Recall:  Good  Fund of Knowledge:Fair  Language: Good  Akathisia:  No  Handed:  Right  AIMS (if indicated):     Assets:  Desire for Improvement  Sleep:      Musculoskeletal: Strength & Muscle Tone: within normal limits Gait & Station: normal  Patient leans: N/A  Past Psychiatric History: Diagnosis: Alcohol Related Disorder - Severe (303.90)  Hospitalizations: [facility] adult unit  Outpatient Care: Dr. Sabra Heck  Substance Abuse Care: None reported  Self-Mutilation:  Denies  Suicidal Attempts: Denies attempts and or thoughts  Violent Behaviors: NA   Past Medical History:   Past  Medical History  Diagnosis Date  . Alcohol abuse   . Depression   . Hypertension    Cardiac History:  HTN  Allergies:   Allergies  Allergen Reactions  . Cashew Nut Oil Nausea And Vomiting    Allergy to cashews only; Tolerates other nuts   PTA Medications: Prescriptions prior to admission  Medication Sig Dispense Refill  . buPROPion (WELLBUTRIN XL) 300 MG 24 hr tablet Take 300 mg by mouth daily.      . Multiple Vitamin (MULTIVITAMIN WITH MINERALS) TABS tablet Take 1 tablet by mouth daily.      . traZODone (DESYREL) 50 MG tablet Take 50 mg by mouth at bedtime as needed and may repeat dose one time if needed for sleep. For sleep       Previous Psychotropic Medications:  Medication/Dose  See medication lists               Substance Abuse History in the last 12 months:  yes  Consequences of Substance Abuse: Medical Consequences:  Liver damage, Possible death by overdose Legal Consequences:  Arrests, jail time, Loss of driving privilege. Family Consequences:  Family discord, divorce and or separation.  Social History:  reports that he has never smoked. He does not have any smokeless tobacco history on file. He reports that he drinks alcohol. He reports that he uses illicit drugs (Marijuana).  Additional Social History: Current Place of Residence: [place], [place] of Birth: [place], [place]    Family Members: "I have 2 children"  Marital Status:  Single  Children: 2  Sons: 1  Daughters: 1  Relationships: Single  Education:  [employer] Charity fundraiser Problems/Performance: Completed high school  Religious Beliefs/Practices: NA  History of Abuse (Emotional/Phsycial/Sexual: Denies  Occupational Experiences: Medical laboratory scientific officer History:  None.  Legal History: None reported  Hobbies/Interests: NA  Family History:  No family history on file.  Results for orders placed during the hospital encounter of 10/15/13 (from the past 72 hour(s))  URINE RAPID  DRUG SCREEN (HOSP PERFORMED)     Status: None   Collection Time    10/15/13  1:34 AM      Result Value Ref Range   Opiates NONE DETECTED  NONE DETECTED   Cocaine NONE DETECTED  NONE DETECTED   Benzodiazepines NONE DETECTED  NONE DETECTED   Amphetamines NONE DETECTED  NONE DETECTED   Tetrahydrocannabinol NONE DETECTED  NONE DETECTED   Barbiturates NONE DETECTED  NONE DETECTED   Comment:            DRUG SCREEN FOR MEDICAL PURPOSES     ONLY.  IF CONFIRMATION IS NEEDED     FOR ANY PURPOSE, NOTIFY LAB     WITHIN 5 DAYS.                LOWEST DETECTABLE LIMITS     FOR URINE DRUG SCREEN     Drug Class       Cutoff (ng/mL)     Amphetamine      1000     Barbiturate      200     Benzodiazepine   381     Tricyclics  300     Opiates          300     Cocaine          300     THC              50  ACETAMINOPHEN LEVEL     Status: None   Collection Time    10/15/13  1:42 AM      Result Value Ref Range   Acetaminophen (Tylenol), Serum <15.0  10 - 30 ug/mL   Comment:            THERAPEUTIC CONCENTRATIONS VARY     SIGNIFICANTLY. A RANGE OF 10-30     ug/mL MAY BE AN EFFECTIVE     CONCENTRATION FOR MANY PATIENTS.     HOWEVER, SOME ARE BEST TREATED     AT CONCENTRATIONS OUTSIDE THIS     RANGE.     ACETAMINOPHEN CONCENTRATIONS     >150 ug/mL AT 4 HOURS AFTER     INGESTION AND >50 ug/mL AT 12     HOURS AFTER INGESTION ARE     OFTEN ASSOCIATED WITH TOXIC     REACTIONS.  CBC     Status: None   Collection Time    10/15/13  1:42 AM      Result Value Ref Range   WBC 7.4  4.0 - 10.5 K/uL   RBC 5.26  4.22 - 5.81 MIL/uL   Hemoglobin 16.3  13.0 - 17.0 g/dL   HCT 45.4  39.0 - 52.0 %   MCV 86.3  78.0 - 100.0 fL   MCH 31.0  26.0 - 34.0 pg   MCHC 35.9  30.0 - 36.0 g/dL   RDW 11.9  11.5 - 15.5 %   Platelets 229  150 - 400 K/uL  COMPREHENSIVE METABOLIC PANEL     Status: Abnormal   Collection Time    10/15/13  1:42 AM      Result Value Ref Range   Sodium 143  137 - 147 mEq/L   Potassium  4.0  3.7 - 5.3 mEq/L   Chloride 101  96 - 112 mEq/L   CO2 27  19 - 32 mEq/L   Glucose, Bld 115 (*) 70 - 99 mg/dL   BUN 7  6 - 23 mg/dL   Creatinine, Ser 0.91  0.50 - 1.35 mg/dL   Calcium 9.7  8.4 - 10.5 mg/dL   Total Protein 8.2  6.0 - 8.3 g/dL   Albumin 4.6  3.5 - 5.2 g/dL   AST 43 (*) 0 - 37 U/L   ALT 65 (*) 0 - 53 U/L   Alkaline Phosphatase 68  39 - 117 U/L   Total Bilirubin <0.2 (*) 0.3 - 1.2 mg/dL   GFR calc non Af Amer >90  >90 mL/min   GFR calc Af Amer >90  >90 mL/min   Comment: (NOTE)     The eGFR has been calculated using the CKD EPI equation.     This calculation has not been validated in all clinical situations.     eGFR's persistently <90 mL/min signify possible Chronic Kidney     Disease.  ETHANOL     Status: Abnormal   Collection Time    10/15/13  1:42 AM      Result Value Ref Range   Alcohol, Ethyl (B) 281 (*) 0 - 11 mg/dL   Comment:            LOWEST DETECTABLE LIMIT FOR  SERUM ALCOHOL IS 11 mg/dL     FOR MEDICAL PURPOSES ONLY  SALICYLATE LEVEL     Status: Abnormal   Collection Time    10/15/13  1:42 AM      Result Value Ref Range   Salicylate Lvl <2.8 (*) 2.8 - 20.0 mg/dL   Psychological Evaluations:  Assessment:   DSM5: Schizophrenia Disorders:  NA Obsessive-Compulsive Disorders:  NA Trauma-Stressor Disorders:  NA Substance/Addictive Disorders:  Alcohol Related Disorder - Severe (303.90) Depressive Disorders:  Major depression  AXIS I:  Alcohol Related Disorder - Severe (303.90), Major depression AXIS II:  Deferred AXIS III:   Past Medical History  Diagnosis Date  . Alcohol abuse   . Depression   . Hypertension    AXIS IV:  other psychosocial or environmental problems and Alcoholism, chronic AXIS V:  41-50 serious symptoms  Treatment Plan/Recommendations: 1. Admit for crisis management and stabilization, estimated length of stay 3-5 days.  2. Medication management to reduce current symptoms to base line and improve the patient's overall  level of functioning; (a), Continue Librium detox, Resumed Wellbutrin XL 300 mg, add Tegretol 200 mg Q bedtime for mood swings and Trazodone 50 mg Q bedtime for sleep. 3. Treat health problems as indicated.  4. Develop treatment plan to decrease risk of relapse upon discharge and the need for readmission.  5. Psycho-social education regarding relapse prevention and self care.  6. Health care follow up as needed for medical problems.  7. Review, reconcile, and reinstate any pertinent home medications for other health issues where appropriate. 8. Call for consults with hospitalist for any additional specialty patient care services as needed.  Treatment Plan Summary: Daily contact with patient to assess and evaluate symptoms and progress in treatment Medication management  Current Medications:  No current facility-administered medications for this encounter.   Observation Level/Precautions:  15 minute checks  Laboratory:  Per ED  Psychotherapy: Group sessions   Medications:  Librium detox  Consultations:  As needed  Discharge Concerns: Sobriety   Estimated LOS: 2-4 days  Other:     I certify that inpatient services furnished can reasonably be expected to improve the patient's condition.   Lindell Spar I, PMHNP-BC 3/26/201510:05 AM  Patient seen, evaluated and I agree with notes by Nurse Practitioner. Corena Pilgrim, MD

## 2013-10-15 NOTE — Progress Notes (Signed)
Dr. Anitra LauthPlunkett signed First Examination paperwork.

## 2013-10-16 DIAGNOSIS — F1910 Other psychoactive substance abuse, uncomplicated: Secondary | ICD-10-CM

## 2013-10-16 DIAGNOSIS — F1010 Alcohol abuse, uncomplicated: Secondary | ICD-10-CM

## 2013-10-16 MED ORDER — BUPROPION HCL ER (XL) 300 MG PO TB24: 300.0000 mg | ORAL_TABLET | Freq: Every day | ORAL | Status: DC

## 2013-10-16 MED ORDER — HYDROXYZINE HCL 25 MG PO TABS: 25.0000 mg | ORAL_TABLET | Freq: Four times a day (QID) | ORAL | Status: DC | PRN

## 2013-10-16 MED ORDER — TRAZODONE HCL 50 MG PO TABS: 50.0000 mg | ORAL_TABLET | Freq: Every evening | ORAL | Status: DC | PRN

## 2013-10-16 MED ORDER — CARBAMAZEPINE ER 200 MG PO TB12: 200.0000 mg | ORAL_TABLET | Freq: Every day | ORAL | Status: DC

## 2013-10-16 NOTE — Tx Team (Addendum)
Interdisciplinary Treatment Plan Update (Adult)  Date: 10/16/2013   Time Reviewed: 11:32 AM  Progress in Treatment:  Attending groups: yes  Participating in groups:  Yes  Taking medication as prescribed: Yes  Tolerating medication: Yes  Family/Significant othe contact made: SPE completed with pt's girlfriend  Patient understands diagnosis: Yes, AEB seeking treatment for SI/cutting, depression/mood stabilization, and ETOH abuse (IVC)  Discussing patient identified problems/goals with staff: Yes  Medical problems stabilized or resolved: Yes  Denies suicidal/homicidal ideation: yes, during group/self report.  Patient has not harmed self or Others: Yes  New problem(s) identified:  Discharge Plan or Barriers: [facility]essment for therapy services. He is not interested in inpatient treatment at this time.  Additional comments: 28 year old Caucasian male. He reports, [facility]. I need to stop drinking alcohol. I have been drinking heavily x 1 month. Was sober x 5 months, relapsed 1 month ago. Drink about 12 packs of beer daily. I don't really know why I drink so much. I guess I'm an alcoholic since the age of 28. I don't think I need substance abuse treatment. I'm on medication for depression, Wellbutrin XL. I have mood swings as well. Part of my medical hx includes multiple head injuries. I'm not suicidal. Used to hear voices and see things. My symptoms now include; Nausea, tremors. Reason for Continuation of Hospitalization: d/c today Estimated length of stay: 1 day (HS) For review of initial/current patient goals, please see plan of care.  Attendees:  Patient:    Family:    Physician: Geoffery LyonsIrving Lugo MD 10/16/2013 11:32 AM   Nursing: Thayer Ohmhris RN 10/16/2013 11:32 AM   Clinical Social Worker Labella Zahradnik Smart, LCSWA  10/16/2013 11:32 AM   Other: Darden Datesjennifer C. Nurse CM  10/16/2013 11:32 AM   Other:    Other: Massie Kluverelores Sutton, Community Care  Coordinator  10/16/2013 11:32 AM   Other:    Scribe for Treatment Team:  The Sherwin-WilliamsHeather Smart LCSWA 10/16/2013 11:32 AM

## 2013-10-16 NOTE — Progress Notes (Signed)
[name] is given his DC AVS after having it explained to him previously by CJ. He is given sample meds from the pharmacy and he is then given a unit survey ( that he takes home with him). He completed his morning self inventory and on it he wrote he denies having SI within he past 24 hrs, he rates his depression and hopelessness '2/1 and says " therapy" will be his DC plans. All belongings are returned to him as he is escorted  To bldg entrance and DC'George.

## 2013-10-16 NOTE — [facility] Group Notes (Signed)
[facility] LCSW Group Therapy  10/16/2013 2:48 PM  Type of Therapy:  Group Therapy  Participation Level:  Active  Participation Quality:  Attentive  Affect:  Appropriate  Cognitive:  Alert and Oriented  Insight:  Engaged  Engagement in Therapy:  Engaged  Modes of Intervention:  Confrontation, Discussion, Education, Exploration, Problem-solving, Rapport Building, Socialization and Support  Summary of Progress/Problems: Feelings around Relapse. Group members discussed the meaning of relapse and shared personal stories of relapse, how it affected them and others, and how they perceived themselves during this time. Group members were encouraged to identify triggers, warning signs and coping skills used when facing the possibility of relapse. Social supports were discussed and explored in detail. Post Acute Withdrawal Syndrome (handout provided) was introduced and examined. Pt's were encouraged to ask questions, talk about key points associated with PAWS, and process this information in terms of relapse prevention. [name] was attentive and engaged throughout today's therapy group. He shared that he has experienced the negative symptoms associated with PAWS which triggered his subsequent relapse. [name] shared that he must figure out a safety plan in order to cope with this in the future in order to prevent relapse. [name] shows progress in the group setting and improving insight AEB his ability to identify how remaining compliant with meds and going to therapy can assist with preventing relapse.    [name], [name] LCSWA  10/16/2013, 2:48 PM

## 2013-10-16 NOTE — Progress Notes (Signed)
Adult Psychoeducational Group Note  Date:  10/16/2013 Time:  11:37 AM  Group Topic/Focus:  Relapse Prevention Planning:   The focus of this group is to define relapse and discuss the need for planning to combat relapse.  Participation Level:  Minimal  Participation Quality:  Appropriate, Sharing and Supportive  Affect:  Appropriate  Cognitive:  Alert and Appropriate  Insight: Appropriate  Engagement in Group:  Engaged and Supportive  Modes of Intervention:  Support  Additional Comments:   Lauralee Evenerowlin, Raechell Singleton Jvette 10/16/2013, 11:37 AM

## 2013-10-16 NOTE — Progress Notes (Signed)
[facility] Discharge Plan :  Will you be returning to the same living situation after discharge: Yes,  home At discharge, do you have transportation home?:Yes,  girlfriend Do you have the ability to pay for your medications:Yes, mental health-pt reports that he DOES NOT have [employer] insurance   Release of information consent forms completed and submitted to Medical Records by CSW. Patient to Follow up at: Follow-up Information   Follow up with [facility]. (Walk in between 8am-9am Monday through Friday for hospital follow-up/medication management/assessment for therapy services. )    Contact information:   [address] [place], [place] [address] Phone: [phone] Fax: [phone]      Patient denies SI/HI:   Yes,  during group/self report.    Safety Planning and Suicide Prevention discussed:  Yes,  SPE completed with pt's girlfriend. SPI pamphlet provided to pt and he was encouraged to share information with support network, ask questions, and talk about any concerns relating to SPE.  Smart, Mally Gavina LCSWA  10/16/2013, 2:21 PM

## 2013-10-16 NOTE — [facility] Group Notes (Addendum)
[facility]   10/16/2013 9:54 AM  Participation Quality:  Appropriate   Mood/Affect:  Depressed and Flat  Depression Rating:  2  Anxiety Rating:  3  Thoughts of Suicide:  No Will you contract for safety?   NA  Current AVH:  No  Plan for Discharge/Comments:  Pt reports that he relapsed recently after 5 mo sober. Pt reports no SI today and states that he plans [facility] and possibly therapy. Pt reports that he has no withdrawal symptoms today.   Transportation Means: gf   Supports: gf and family   Smart, OncologistHeather LCSWA

## 2013-10-16 NOTE — Discharge Instructions (Signed)
Alcohol Problems °Most adults who drink alcohol drink in moderation (not a lot) are at low risk for developing problems related to their drinking. However, all drinkers, including low-risk drinkers, should know about the health risks connected with drinking alcohol. °RECOMMENDATIONS FOR LOW-RISK DRINKING  °Drink in moderation. Moderate drinking is defined as follows:  °· Men - no more than 2 drinks per day. °· Nonpregnant women - no more than 1 drink per day. °· Over age 65 - no more than 1 drink per day. °A standard drink is 12 grams of pure alcohol, which is equal to a 12 ounce bottle of beer or wine cooler, a 5 ounce glass of wine, or 1.5 ounces of distilled spirits (such as whiskey, brandy, vodka, or rum).  °ABSTAIN FROM (DO NOT DRINK) ALCOHOL: °· When pregnant or considering pregnancy. °· When taking a medication that interacts with alcohol. °· If you are alcohol dependent. °· A medical condition that prohibits drinking alcohol (such as ulcer, liver disease, or heart disease). °DISCUSS WITH YOUR CAREGIVER: °· If you are at risk for coronary heart disease, discuss the potential benefits and risks of alcohol use: Light to moderate drinking is associated with lower rates of coronary heart disease in certain populations (for example, men over age 45 and postmenopausal women). Infrequent or nondrinkers are advised not to begin light to moderate drinking to reduce the risk of coronary heart disease so as to avoid creating an alcohol-related problem. Similar protective effects can likely be gained through proper diet and exercise. °· Women and the elderly have smaller amounts of body water than men. As a result women and the elderly achieve a higher blood alcohol concentration after drinking the same amount of alcohol. °· Exposing a fetus to alcohol can cause a broad range of birth defects referred to as Fetal Alcohol Syndrome (FAS) or Alcohol-Related Birth Defects (ARBD). Although FAS/ARBD is connected with excessive  alcohol consumption during pregnancy, studies also have reported neurobehavioral problems in infants born to mothers reporting drinking an average of 1 drink per day during pregnancy. °· Heavier drinking (the consumption of more than 4 drinks per occasion by men and more than 3 drinks per occasion by women) impairs learning (cognitive) and psychomotor functions and increases the risk of alcohol-related problems, including accidents and injuries. °CAGE QUESTIONS:  °· Have you ever felt that you should Cut down on your drinking? °· Have people Annoyed you by criticizing your drinking? °· Have you ever felt bad or Guilty about your drinking? °· Have you ever had a drink first thing in the morning to steady your nerves or get rid of a hangover (Eye opener)? °If you answered positively to any of these questions: You may be at risk for alcohol-related problems if alcohol consumption is:  °· Men: Greater than 14 drinks per week or more than 4 drinks per occasion. °· Women: Greater than 7 drinks per week or more than 3 drinks per occasion. °Do you or your family have a medical history of alcohol-related problems, such as: °· Blackouts. °· Sexual dysfunction. °· Depression. °· Trauma. °· Liver dysfunction. °· Sleep disorders. °· Hypertension. °· Chronic abdominal pain. °· Has your drinking ever caused you problems, such as problems with your family, problems with your work (or school) performance, or accidents/injuries? °· Do you have a compulsion to drink or a preoccupation with drinking? °· Do you have poor control or are you unable to stop drinking once you have started? °· Do you have to drink to   avoid withdrawal symptoms? °· Do you have problems with withdrawal such as tremors, nausea, sweats, or mood disturbances? °· Does it take more alcohol than in the past to get you high? °· Do you feel a strong urge to drink? °· Do you change your plans so that you can have a drink? °· Do you ever drink in the morning to relieve  the shakes or a hangover? °If you have answered a number of the previous questions positively, it may be time for you to talk to your caregivers, family, and friends and see if they think you have a problem. Alcoholism is a chemical dependency that keeps getting worse and will eventually destroy your health and relationships. Many alcoholics end up dead, impoverished, or in prison. This is often the end result of all chemical dependency. °· Do not be discouraged if you are not ready to take action immediately. °· Decisions to change behavior often involve up and down desires to change and feeling like you cannot decide. °· Try to think more seriously about your drinking behavior. °· Think of the reasons to quit. °WHERE TO GO FOR ADDITIONAL INFORMATION  °· The National Institute on Alcohol Abuse and Alcoholism (NIAAA) °www.niaaa.nih.gov °· National Council on Alcoholism and Drug Dependence (NCADD) °www.ncadd.org °· American Society of Addiction Medicine (ASAM) °www.asam.org  °Document Released: 07/09/2005 Document Revised: 10/01/2011 Document Reviewed: 02/25/2008 °ExitCare® Patient Information ©2014 [employer]. ° °

## 2013-10-16 NOTE — Progress Notes (Signed)
Pt observed resting in bed with eyes closed. RR WNL. Level III obs in place for safety. Pt remains safe. Lawrence MarseillesFriedman, Candelario Steppe Eakes

## 2013-10-16 NOTE — BHH Counselor (Signed)
Adult Psychosocial Assessment Update Interdisciplinary Team  [facility]  Admissions Discharges  Date: 10/15/13 Date: unknown at this time.   Date: 04/01/13 Date: 04/03/13  Date: 07/18/11 Date: 07/20/11  Date: 06/24/10 Date: x   Date: Date:   Changes since the last Psychosocial Assessment (including adherence to outpatient mental health and/or substance abuse treatment, situational issues contributing to decompensation and/or relapse). 28 year old Caucasian male. He reports, [facility]. I need to stop drinking alcohol. I have been drinking heavily x 1 month. Was sober x 5 months, relapsed 1 month ago. Drink about 12 packs of beer daily. I don't really know why I drink so much. I guess I'm an alcoholic since the age of 28. I don't think I need substance abuse treatment. I'm on medication for depression, Wellbutrin XL. I have mood swings as well. Part of my medical hx includes multiple head injuries. I'm not suicidal. Used to hear voices and see things. My symptoms now include; Nausea, tremors.     Pt reports that he had been living with his gf who is his primary emotional support. He relapsed recently after 5 months of sobriety-job loss was major trigger according to pt. Pt reports that he is unemployed and had job interview that he missed yesterday.          Discharge Plan 1. Will you be returning to the same living situation after discharge?   Yes: No:      If no, what is your plan?    Pt refusing referral for inpatient treatment and plans to return home at d/c where he resides with his girlfriend.        2. Would you like a referral for services when you are discharged? Yes:     If yes, for what services?  No:       [facility] and is open to therapy/groups through [facility] in addition to Medication management. Pt refusing all other referrals at this time.        Summary and  Recommendations (to be completed by the evaluator) Pt is 28 year old male living in [place] girlfriend. [facility] SI with attempt to cut himself with razor blade, mood stabilization, ETOH detox, and medication management. Recommendations for pt include: crisis stabilization, therapeutic milieu, encourage group attendance and participation, librium taper for withdrawals, medication management for mood stabilization, and development of comprehensive mental wellness/sobriety plan. At this time, pt refusing referral for inpatient services but is wanting to [facility]/groups/therapy. Pt plans to return home with his girlfriend at d/c.                        Signature:  Micah NoelSmart, [place] Youngblood, LCSWA 10/16/2013 12:27 PM

## 2013-10-16 NOTE — BHH Suicide Risk Assessment (Signed)
Suicide Risk Assessment  Discharge Assessment     Demographic Factors:  Male, Adolescent or young adult and Caucasian  Total Time spent with patient: 45 minutes  Psychiatric Specialty Exam:     Blood pressure 143/98, pulse 88, temperature 98.9 F (37.2 C), temperature source Oral, resp. rate 18, height 5\' 9"  (1.753 m), weight 70.761 kg (156 lb), SpO2 98.00%.Body mass index is 23.03 kg/(m^2).  General Appearance: Fairly Groomed  Patent attorneyye Contact::  Fair  Speech:  Clear and Coherent  Volume:  Normal  Mood:  Anxious and worried  Affect:  Appropriate  Thought Process:  Coherent and Goal Directed  Orientation:  Full (Time, Place, and Person)  Thought Content:  worries, concerns, wanting to be D/C to be able to go to a new job in the AM, his relapse prevention plan  Suicidal Thoughts:  No  Homicidal Thoughts:  No  Memory:  Immediate;   Fair Recent;   Fair Remote;   Fair  Judgement:  Fair  Insight:  Present  Psychomotor Activity:  Normal  Concentration:  Fair  Recall:  FiservFair  Fund of Knowledge:Fair  Language: Fair  Akathisia:  No  Handed:    AIMS (if indicated):     Assets:  Desire for Improvement Housing Social Support  Sleep:  Number of Hours: 6.5    Musculoskeletal: Strength & Muscle Tone: within normal limits Gait & Station: normal Patient leans: N/A   Mental Status Per Nursing Assessment::   On Admission:     Current Mental Status by Physician: In full contact with reality. Wants to go to work in the morning to a new job. States his not working has contributed to the depression and his drinking. Looking forward to having that structure as he has bee working since he was 28 Y/O   Loss Factors: Financial problems/change in socioeconomic status  Historical Factors: NA  Risk Reduction Factors:   Responsible for children under 30 years of age, Sense of responsibility to family, Living with another person, especially a relative and Positive social support  Continued  Clinical Symptoms:  Depression:   Comorbid alcohol abuse/dependence Insomnia Alcohol/Substance Abuse/Dependencies  Cognitive Features That Contribute To Risk:  Closed-mindedness Polarized thinking Thought constriction (tunnel vision)    Suicide Risk:  Minimal: No identifiable suicidal ideation.  Patients presenting with no risk factors but with morbid ruminations; may be classified as minimal risk based on the severity of the depressive symptoms  Discharge Diagnoses:   AXIS I:  Alcohol Dependence, MDD, Social Anxiety Dis AXIS II:  Deferred AXIS III:   Past Medical History  Diagnosis Date  . Alcohol abuse   . Depression   . Hypertension    AXIS IV:  other psychosocial or environmental problems AXIS V:  61-70 mild symptoms  Plan Of Care/Follow-up recommendations:  Activity:  as tolerated Diet:  regular Follow up [facility] Is patient on multiple antipsychotic therapies at discharge:  No   Has Patient had three or more failed trials of antipsychotic monotherapy by history:  No  Recommended Plan for Multiple Antipsychotic Therapies: NA    Kyren Knick A 10/16/2013, 3:08 PM

## 2013-10-16 NOTE — Discharge Summary (Signed)
Physician Discharge Summary Note  Patient:  [name] is an 28 y.o., male MRN:  [id number] DOB:  1986/04/25 Patient phone:  [phone] (home)  Patient address:   [place]. [place] [address],  Total Time spent with patient: 20 minutes  Date of Admission:  10/15/2013 Date of Discharge: 10/16/2013  Reason for Admission:  Alcohol detox/dependency  Discharge Diagnoses: Active Problems:   Alcohol dependence   Social anxiety disorder   MDD (major depressive disorder)   Psychiatric Specialty Exam: Physical Exam  Constitutional: He is oriented to person, place, and time. He appears well-developed and well-nourished.  HENT:  Head: Normocephalic and atraumatic.  Neck: Normal range of motion.  Musculoskeletal: Normal range of motion.  Neurological: He is alert and oriented to person, place, and time.  Skin: Skin is warm and dry.    Review of Systems  Constitutional: Negative.   HENT: Negative.   Eyes: Negative.   Respiratory: Negative.   Cardiovascular: Negative.   Gastrointestinal: Negative.   Genitourinary: Negative.   Musculoskeletal: Negative.   Skin: Negative.   Neurological: Negative.   Endo/Heme/Allergies: Negative.   Psychiatric/Behavioral: Positive for substance abuse.    Blood pressure 143/98, pulse 88, temperature 98.9 F (37.2 C), temperature source Oral, resp. rate 18, height $RemoveBe'5\' 9"'yCWOhgjTi$  (1.753 m), weight 156 lb (70.761 kg), SpO2 98.00%.Body mass index is 23.03 kg/(m^2).  General Appearance: Disheveled  Eye Contact::  Good  Speech:  Normal Rate  Volume:  Normal  Mood:  Euthymic  Affect:  Congruent  Thought Process:  Coherent  Orientation:  Full (Time, Place, and Person)  Thought Content:  WDL  Suicidal Thoughts:  No  Homicidal Thoughts:  No  Memory:  Immediate;   Good Recent;   Good Remote;   Good  Judgement:  Fair  Insight:  Fair  Psychomotor Activity:  Normal  Concentration:  Good  Recall:  Good  Fund of Knowledge:Fair  Language: Good   Akathisia:  No  Handed:  Right  AIMS (if indicated):     Assets:  Housing Physical Health Resilience Social Support Vocational/Educational  Sleep:  Number of Hours: 6.5    Past Psychiatric History: Diagnosis:  Hospitalizations:  Outpatient Care:  Substance Abuse Care:  Self-Mutilation:  Suicidal Attempts:  Violent Behaviors:   Musculoskeletal: Strength & Muscle Tone: within normal limits Gait & Station: normal Patient leans: N/A  DSM5:  Substance/Addictive Disorders:  Alcohol Related Disorder - Severe (303.90), Alcohol Intoxication with Use Disorder - Mild ((F10.129) and Alcohol Withdrawal (291.81) Depressive Disorders:  Major Depressive Disorder - Mild (296.21)  Axis Diagnosis:   AXIS I:  Alcohol Abuse and Substance Abuse AXIS II:  Deferred AXIS III:   Past Medical History  Diagnosis Date  . Alcohol abuse   . Depression   . Hypertension    AXIS IV:  problems related to social environment AXIS V:  61-70 mild symptoms  Level of Care:  OP  Hospital Course:  On admission:  28 year old Caucasian male. He reports, I went to the [facility] last night. I need to stop drinking alcohol. I have been drinking heavily x 1 month. Was sober x 5 months, relapsed 1 month ago. Drink about 12 packs of beer daily. I don't really know why I drink so much. I guess I'm an alcoholic since the age of 54. I don't think I need substance abuse treatment. I'm on medication for depression, Wellbutrin XL. I have mood swings as well. Part of my medical hx includes multiple  head injuries. I'm not suicidal. Used to hear voices and see things. My symptoms now include; Nausea, tremors. During hospitalization:   Medications managed--Librium alcohol detox protocol utilized.  His regular medications were restarted--Wellbutrin 300 mg daily for depression, Trazodone 50 mg at bedtime for sleep, and multi-vitamin daily for vitamin deficiency.  Tegretol 200 mg at bedtime started for mood  stabilization started.  The patient requested discharge for a job interview tomorrow and his wife is in agreement with this plan.  Unemployment has been a major trigger for his depression and drinking issues since he has been working since the age of 42.  A Librium taper pack was given to him to assist his home alcohol detox.  Patient denied suicidal/homicidal ideations and auditory/visual hallucinations, follow-up appointments encouraged to attend, outside support groups encouraged and information given.  He is mentally and physically stable for discharge.  Consults:  None  Significant Diagnostic Studies:   None  Discharge Vitals:   Blood pressure 143/98, pulse 88, temperature 98.9 F (37.2 C), temperature source Oral, resp. rate 18, height 5' 9" (1.753 m), weight 156 lb (70.761 kg), SpO2 98.00%. Body mass index is 23.03 kg/(m^2). Lab Results:   Results for orders placed during the hospital encounter of 10/15/13 (from the past 72 hour(s))  URINE RAPID DRUG SCREEN (HOSP PERFORMED)     Status: None   Collection Time    10/15/13  1:34 AM      Result Value Ref Range   Opiates NONE DETECTED  NONE DETECTED   Cocaine NONE DETECTED  NONE DETECTED   Benzodiazepines NONE DETECTED  NONE DETECTED   Amphetamines NONE DETECTED  NONE DETECTED   Tetrahydrocannabinol NONE DETECTED  NONE DETECTED   Barbiturates NONE DETECTED  NONE DETECTED   Comment:            DRUG SCREEN FOR MEDICAL PURPOSES     ONLY.  IF CONFIRMATION IS NEEDED     FOR ANY PURPOSE, NOTIFY LAB     WITHIN 5 DAYS.                LOWEST DETECTABLE LIMITS     FOR URINE DRUG SCREEN     Drug Class       Cutoff (ng/mL)     Amphetamine      1000     Barbiturate      200     Benzodiazepine   584     Tricyclics       417     Opiates          300     Cocaine          300     THC              50  ACETAMINOPHEN LEVEL     Status: None   Collection Time    10/15/13  1:42 AM      Result Value Ref Range   Acetaminophen (Tylenol), Serum  <15.0  10 - 30 ug/mL   Comment:            THERAPEUTIC CONCENTRATIONS VARY     SIGNIFICANTLY. A RANGE OF 10-30     ug/mL MAY BE AN EFFECTIVE     CONCENTRATION FOR MANY PATIENTS.     HOWEVER, SOME ARE BEST TREATED     AT CONCENTRATIONS OUTSIDE THIS     RANGE.     ACETAMINOPHEN CONCENTRATIONS     >150 ug/mL AT 4 HOURS AFTER  INGESTION AND >50 ug/mL AT 12     HOURS AFTER INGESTION ARE     OFTEN ASSOCIATED WITH TOXIC     REACTIONS.  CBC     Status: None   Collection Time    10/15/13  1:42 AM      Result Value Ref Range   WBC 7.4  4.0 - 10.5 K/uL   RBC 5.26  4.22 - 5.81 MIL/uL   Hemoglobin 16.3  13.0 - 17.0 g/dL   HCT 45.4  39.0 - 52.0 %   MCV 86.3  78.0 - 100.0 fL   MCH 31.0  26.0 - 34.0 pg   MCHC 35.9  30.0 - 36.0 g/dL   RDW 11.9  11.5 - 15.5 %   Platelets 229  150 - 400 K/uL  COMPREHENSIVE METABOLIC PANEL     Status: Abnormal   Collection Time    10/15/13  1:42 AM      Result Value Ref Range   Sodium 143  137 - 147 mEq/L   Potassium 4.0  3.7 - 5.3 mEq/L   Chloride 101  96 - 112 mEq/L   CO2 27  19 - 32 mEq/L   Glucose, Bld 115 (*) 70 - 99 mg/dL   BUN 7  6 - 23 mg/dL   Creatinine, Ser 0.91  0.50 - 1.35 mg/dL   Calcium 9.7  8.4 - 10.5 mg/dL   Total Protein 8.2  6.0 - 8.3 g/dL   Albumin 4.6  3.5 - 5.2 g/dL   AST 43 (*) 0 - 37 U/L   ALT 65 (*) 0 - 53 U/L   Alkaline Phosphatase 68  39 - 117 U/L   Total Bilirubin <0.2 (*) 0.3 - 1.2 mg/dL   GFR calc non Af Amer >90  >90 mL/min   GFR calc Af Amer >90  >90 mL/min   Comment: (NOTE)     The eGFR has been calculated using the CKD EPI equation.     This calculation has not been validated in all clinical situations.     eGFR's persistently <90 mL/min signify possible Chronic Kidney     Disease.  ETHANOL     Status: Abnormal   Collection Time    10/15/13  1:42 AM      Result Value Ref Range   Alcohol, Ethyl (B) 281 (*) 0 - 11 mg/dL   Comment:            LOWEST DETECTABLE LIMIT FOR     SERUM ALCOHOL IS 11 mg/dL     FOR  MEDICAL PURPOSES ONLY  SALICYLATE LEVEL     Status: Abnormal   Collection Time    10/15/13  1:42 AM      Result Value Ref Range   Salicylate Lvl <9.1 (*) 2.8 - 20.0 mg/dL    Physical Findings: AIMS: Facial and Oral Movements Muscles of Facial Expression: None, normal Lips and Perioral Area: None, normal Jaw: None, normal Tongue: None, normal,Extremity Movements Upper (arms, wrists, hands, fingers): None, normal Lower (legs, knees, ankles, toes): None, normal, Trunk Movements Neck, shoulders, hips: None, normal, Overall Severity Severity of abnormal movements (highest score from questions above): None, normal Incapacitation due to abnormal movements: None, normal Patient's awareness of abnormal movements (rate only patient's report): No Awareness, Dental Status Current problems with teeth and/or dentures?: No Does patient usually wear dentures?: No  CIWA:  CIWA-Ar Total: 7 COWS:     Psychiatric Specialty Exam: See Psychiatric Specialty Exam and Suicide Risk Assessment completed  by Attending Physician prior to discharge.  Discharge destination:  Home  Is patient on multiple antipsychotic therapies at discharge:  No   Has Patient had three or more failed trials of antipsychotic monotherapy by history:  No  Recommended Plan for Multiple Antipsychotic Therapies: NA  Discharge Orders   Future Orders Complete By Expires   Activity as tolerated - No restrictions  As directed    Diet - low sodium heart healthy  As directed        Medication List       Indication   buPROPion 300 MG 24 hr tablet  Commonly known as:  WELLBUTRIN XL  Take 1 tablet (300 mg total) by mouth daily.   Indication:  Major Depressive Disorder     carbamazepine 200 MG 12 hr tablet  Commonly known as:  TEGRETOL XR  Take 1 tablet (200 mg total) by mouth at bedtime.   Indication:  Mood swings     hydrOXYzine 25 MG tablet  Commonly known as:  ATARAX/VISTARIL  Take 1 tablet (25 mg total) by mouth every  6 (six) hours as needed for anxiety (or CIWA score </= 10).      multivitamin with minerals Tabs tablet  Take 1 tablet by mouth daily.   Indication:  vitamin deficiency     traZODone 50 MG tablet  Commonly known as:  DESYREL  Take 1 tablet (50 mg total) by mouth at bedtime as needed and may repeat dose one time if needed for sleep. For sleep   Indication:  Trouble Sleeping           Follow-up Information   Follow up with [facility]. (Walk in between 8am-9am Monday through Friday for hospital follow-up/medication management/assessment for therapy services. )    Contact information:   [address], [place] [address] Phone: [phone] Fax: [phone]      Follow-up recommendations:  Activity:  as tolerated Diet:  low-sodium heart healthy diet  Comments:  Patient will [facility] at [facility].  Total Discharge Time:  Greater than 30 minutes.  SignedWaylan Boga, [place] 10/16/2013, 5:13 PM Personally evaluated the patient and agree with assessment and plan Geralyn Flash A. Sabra Heck, M.D.

## 2013-10-16 NOTE — [facility] Suicide Risk Assessment (Signed)
[facility] INPATIENT:  Family/Significant Other Suicide Prevention Education  Suicide Prevention Education:  Education Completed; [name] (pt's girlfriend) [phone] has been identified by the patient as the family member/significant other with whom the patient will be residing, and identified as the person(s) who will aid the patient in the event of a mental health crisis (suicidal ideations/suicide attempt).  With written consent from the patient, the family member/significant other has been provided the following suicide prevention education, prior to the and/or following the discharge of the patient.  The suicide prevention education provided includes the following:  Suicide risk factors  Suicide prevention and interventions  National Suicide Hotline telephone number  [facility] assessment telephone number  [facility] 911  [facility] and/or Residential Mobile Crisis Unit telephone number  Request made of family/significant other to:  Remove weapons (e.g., guns, rifles, knives), all items previously/currently identified as safety concern.    Remove drugs/medications (over-the-counter, prescriptions, illicit drugs), all items previously/currently identified as a safety concern.  The family member/significant other verbalizes understanding of the suicide prevention education information provided.  The family member/significant other agrees to remove the items of safety concern listed above.  Smart, [name] LCSWA  10/16/2013, 2:20 PM

## 2013-10-17 NOTE — Progress Notes (Signed)
MD completes DC order and DC SRA in chart.  Pt denies SI, HI and / or presence of audit, vis tactile halluc.  Pt's DC AVS is reviewed  [facility] and he demonstrates understanding by telling this RN that he " will do better now when I go home. I will take my medicine and keep my doctor appts.". He is given all belongings that were previously locked up, he signs release and then he is escorted to bldg entrance and DC'd home.

## 2013-10-21 NOTE — Progress Notes (Signed)
Patient Discharge Instructions:  After Visit Summary (AVS):   Faxed to:  10/21/13 Discharge Summary Note:   Faxed to:  10/21/13 Psychiatric Admission Assessment Note:   Faxed to:  10/21/13 Suicide Risk Assessment - Discharge Assessment:   Faxed to:  10/21/13 Faxed/Sent to the Next Level Care provider:  10/21/13 [facility] @ [phone]  Jerelene ReddenSheena E Colfax, 10/21/2013, 3:27 PM

## 2014-10-12 ENCOUNTER — Emergency Department (HOSPITAL_COMMUNITY)
Admission: EM | Admit: 2014-10-12 | Discharge: 2014-10-13 | Disposition: A | Discharge status: Home / Self Care | Payer: Self-pay | Attending: Emergency Medicine | Admitting: Emergency Medicine

## 2014-10-12 ENCOUNTER — Encounter (HOSPITAL_COMMUNITY): Payer: Self-pay | Admitting: Emergency Medicine

## 2014-10-12 DIAGNOSIS — R45851 Suicidal ideations: Secondary | ICD-10-CM | POA: Insufficient documentation

## 2014-10-12 DIAGNOSIS — F1010 Alcohol abuse, uncomplicated: Secondary | ICD-10-CM

## 2014-10-12 DIAGNOSIS — Z720 Tobacco use: Secondary | ICD-10-CM | POA: Insufficient documentation

## 2014-10-12 DIAGNOSIS — I10 Essential (primary) hypertension: Secondary | ICD-10-CM | POA: Insufficient documentation

## 2014-10-12 DIAGNOSIS — Z79899 Other long term (current) drug therapy: Secondary | ICD-10-CM | POA: Insufficient documentation

## 2014-10-12 LAB — CBC WITH DIFFERENTIAL/PLATELET
Basophils Absolute: 0 10*3/uL (ref 0.0–0.1)
Basophils Relative: 0 % (ref 0–1)
EOS ABS: 0.1 10*3/uL (ref 0.0–0.7)
Eosinophils Relative: 1 % (ref 0–5)
HCT: 45.8 % (ref 39.0–52.0)
Hemoglobin: 16.5 g/dL (ref 13.0–17.0)
LYMPHS PCT: 40 % (ref 12–46)
Lymphs Abs: 2.7 10*3/uL (ref 0.7–4.0)
MCH: 31.4 pg (ref 26.0–34.0)
MCHC: 36 g/dL (ref 30.0–36.0)
MCV: 87.1 fL (ref 78.0–100.0)
MONOS PCT: 9 % (ref 3–12)
Monocytes Absolute: 0.6 10*3/uL (ref 0.1–1.0)
Neutro Abs: 3.4 10*3/uL (ref 1.7–7.7)
Neutrophils Relative %: 50 % (ref 43–77)
Platelets: 209 10*3/uL (ref 150–400)
RBC: 5.26 MIL/uL (ref 4.22–5.81)
RDW: 12.2 % (ref 11.5–15.5)
WBC: 6.8 10*3/uL (ref 4.0–10.5)

## 2014-10-12 LAB — ETHANOL
ALCOHOL ETHYL (B): 323 mg/dL — AB (ref 0–9)
Alcohol, Ethyl (B): 147 mg/dL — ABNORMAL HIGH (ref 0–9)

## 2014-10-12 LAB — RAPID URINE DRUG SCREEN, HOSP PERFORMED
AMPHETAMINES: NOT DETECTED
BARBITURATES: NOT DETECTED
Benzodiazepines: NOT DETECTED
COCAINE: NOT DETECTED
Opiates: NOT DETECTED
Tetrahydrocannabinol: NOT DETECTED

## 2014-10-12 LAB — BASIC METABOLIC PANEL
ANION GAP: 14 (ref 5–15)
BUN: <5 mg/dL — ABNORMAL LOW (ref 6–23)
CHLORIDE: 102 mmol/L (ref 96–112)
CO2: 25 mmol/L (ref 19–32)
Calcium: 9.6 mg/dL (ref 8.4–10.5)
Creatinine, Ser: 0.89 mg/dL (ref 0.50–1.35)
GFR calc non Af Amer: >90 mL/min (ref >90)
Glucose, Bld: 138 mg/dL — ABNORMAL HIGH (ref 70–99)
Potassium: 3.6 mmol/L (ref 3.5–5.1)
SODIUM: 141 mmol/L (ref 135–145)

## 2014-10-12 MED ORDER — IBUPROFEN 400 MG PO TABS: 600.0000 mg | ORAL_TABLET | Freq: Three times a day (TID) | ORAL | Status: DC | PRN

## 2014-10-12 MED ORDER — M.V.I. ADULT IV INJ
INJECTION | Freq: Once | INTRAVENOUS | Status: AC
  Administered 2014-10-12: 05:00:00 via INTRAVENOUS
  Filled 2014-10-12: qty 1000

## 2014-10-12 MED ORDER — LORAZEPAM 1 MG PO TABS: 0.0000 mg | ORAL_TABLET | Freq: Two times a day (BID) | ORAL | Status: DC

## 2014-10-12 MED ORDER — SODIUM CHLORIDE 0.9 % IV BOLUS (SEPSIS)
1000.0000 mL | Freq: Once | INTRAVENOUS | Status: AC
  Administered 2014-10-12: 1000 mL via INTRAVENOUS

## 2014-10-12 MED ORDER — ONDANSETRON HCL 4 MG PO TABS: 4.0000 mg | ORAL_TABLET | Freq: Three times a day (TID) | ORAL | Status: DC | PRN

## 2014-10-12 MED ORDER — ACETAMINOPHEN 325 MG PO TABS
650.0000 mg | ORAL_TABLET | ORAL | Status: DC | PRN
  Administered 2014-10-12 (×2): 650 mg via ORAL
  Filled 2014-10-12 (×2): qty 2

## 2014-10-12 MED ORDER — ACETAMINOPHEN 325 MG PO TABS: 650.0000 mg | ORAL_TABLET | ORAL | Status: DC | PRN

## 2014-10-12 MED ORDER — HYDROXYZINE HCL 25 MG PO TABS: 25.0000 mg | ORAL_TABLET | Freq: Four times a day (QID) | ORAL | Status: DC | PRN

## 2014-10-12 MED ORDER — ALUM & MAG HYDROXIDE-SIMETH 200-200-20 MG/5ML PO SUSP: 30.0000 mL | ORAL | Status: DC | PRN

## 2014-10-12 MED ORDER — BUPROPION HCL ER (XL) 300 MG PO TB24
300.0000 mg | ORAL_TABLET | Freq: Every day | ORAL | Status: DC
  Filled 2014-10-12: qty 1

## 2014-10-12 MED ORDER — LORAZEPAM 1 MG PO TABS
0.0000 mg | ORAL_TABLET | Freq: Four times a day (QID) | ORAL | Status: DC
  Administered 2014-10-12: 1 mg via ORAL
  Filled 2014-10-12: qty 1

## 2014-10-12 MED ORDER — CARBAMAZEPINE ER 200 MG PO TB12
200.0000 mg | ORAL_TABLET | Freq: Every day | ORAL | Status: DC
  Filled 2014-10-12: qty 1

## 2014-10-12 MED ORDER — TRAZODONE HCL 50 MG PO TABS: 50.0000 mg | ORAL_TABLET | Freq: Every evening | ORAL | Status: DC | PRN

## 2014-10-12 NOTE — ED Notes (Signed)
Pt states has been drinking 20 beers a day for awhile-- and has added a fifth of whiskey in two days in the past. Does want treatment for ETOH abuse and for suicidal thoughts.

## 2014-10-12 NOTE — ED Notes (Signed)
[facility] aware pt advising he wants to go home - states feels will do better at home rather than going to behavioral facility. Denies SI/HI. Pt calm, cooperative.

## 2014-10-12 NOTE — BH Assessment (Signed)
[facility] Assessment Progress Note  Talked with [facility] staff and they will take machine in the room. Dr. Patria Maneampos is unavailable to speak with/take history at the moment due to caring for a pt.

## 2014-10-12 NOTE — ED Provider Notes (Addendum)
8:29 AM Patient is awakened alert and oriented at this time.  This is surprising given his level alcohol in his system.  He obviously has a high tolerance.  Patient remains with passive SI.  Behavior health to evaluate at the bedside with TTS consultation at this time.  Heart rate remains elevated in the 130s.  Additional IV fluids will be given.  Patient has a symptomatic.  EKG will be obtained.    EKG Interpretation  Date/Time:  Tuesday October 12 2014 09:02:43 EDT Ventricular Rate:  124 PR Interval:  153 QRS Duration: 87 QT Interval:  327 QTC Calculation: 470 R Axis:   87 Text Interpretation:  Sinus tachycardia Borderline prolonged QT interval No old tracing to compare Confirmed by Malissia Rabbani  MD, Caryn BeeKEVIN ([id number]) on 10/12/2014 9:26:47 AM       Azalia BilisKevin Jamelyn Bovard, MD 10/12/14 [phone]  Azalia BilisKevin Devron Cohick, MD 10/12/14 [phone]

## 2014-10-12 NOTE — ED Notes (Signed)
Per [employer], pt's wife called out because the pt was drinking, and has a history of becoming suicidal when he has been drinking. Pt reports SI, no HI, and has a plan.

## 2014-10-12 NOTE — ED Provider Notes (Signed)
CSN: [id number]     Arrival date & time 10/12/14  0416 History   First MD Initiated Contact with Patient 10/12/14 0430     Chief Complaint  Patient presents with  . Alcohol Intoxication  . Suicidal     (Consider location/radiation/quality/duration/timing/severity/associated sxs/prior Treatment) HPI  This is a 29 year old male with history of alcohol abuse and depression who presents acutely intoxicated. Patient reports that he's been drinking all day and "I'm just done." He denies suicidal ideation to me. Per police report, patient was reporting suicidal ideation. Patient denies a plan. Reports history of cutting. States that he drinks daily, mostly beer. He has had alcohol withdrawal seizures in the past. He denies any physical time including chest pain, shortness of breath, abdominal pain. He is requesting detox from alcohol. He reports occasional marijuana use. Denies any other illicit drug use.  Past Medical History  Diagnosis Date  . Alcohol abuse   . Depression   . Hypertension    Past Surgical History  Procedure Laterality Date  . Fracture surgery     History reviewed. No pertinent family history. History  Substance Use Topics  . Smoking status: Current Some Day Smoker    Types: Cigars  . Smokeless tobacco: Not on file  . Alcohol Use: 0.0 oz/week    12-18 Cans of beer per week     Comment: heavy drinker    Review of Systems  Constitutional: Negative.  Negative for fever.  Respiratory: Negative.  Negative for chest tightness and shortness of breath.   Cardiovascular: Negative.  Negative for chest pain.  Gastrointestinal: Negative.  Negative for abdominal pain.  Genitourinary: Negative.  Negative for dysuria.  Musculoskeletal: Negative for back pain.  Skin: Negative for rash.  Neurological: Negative for headaches.  Psychiatric/Behavioral: Positive for suicidal ideas.  All other systems reviewed and are negative.     Allergies  Cashew nut oil  Home  Medications   Prior to Admission medications   Medication Sig Start Date End Date Taking? Authorizing Provider  buPROPion (WELLBUTRIN XL) 300 MG 24 hr tablet Take 1 tablet (300 mg total) by mouth daily. 10/16/13   Rachael Fee, MD  carbamazepine (TEGRETOL XR) 200 MG 12 hr tablet Take 1 tablet (200 mg total) by mouth at bedtime. 10/16/13   Rachael Fee, MD  hydrOXYzine (ATARAX/VISTARIL) 25 MG tablet Take 1 tablet (25 mg total) by mouth every 6 (six) hours as needed for anxiety (or CIWA score </= 10). 10/16/13   Rachael Fee, MD  Multiple Vitamin (MULTIVITAMIN WITH MINERALS) TABS tablet Take 1 tablet by mouth daily. 10/16/13   Rachael Fee, MD  traZODone (DESYREL) 50 MG tablet Take 1 tablet (50 mg total) by mouth at bedtime as needed and may repeat dose one time if needed for sleep. For sleep 10/16/13   Rachael Fee, MD   BP 161/112 mmHg  Pulse 147  Temp(Src) 98.2 F (36.8 C) (Oral)  Resp 20  Ht  (1.778 m)  Wt 190 lb (86.183 kg)  BMI 27.26 kg/m2  SpO2 98% Physical Exam  Constitutional: He is oriented to person, place, and time. No distress.  Appears intoxicated, smells of alcohol  HENT:  Head: Normocephalic and atraumatic.  Bilateral piercings with dilators in place  Eyes: Pupils are equal, round, and reactive to light.  Cardiovascular: Regular rhythm and normal heart sounds.   No murmur heard. Tachycardia  Pulmonary/Chest: Effort normal and breath sounds normal. No respiratory distress.  Abdominal: Soft. He exhibits  no distension. There is no tenderness. There is no rebound.  Musculoskeletal: He exhibits no edema.  Neurological: He is alert and oriented to person, place, and time.  Skin: Skin is warm and dry.  Psychiatric:  Intoxicated  Nursing note and vitals reviewed.   ED Course  Procedures (including critical care time) Labs Review Labs Reviewed  ETHANOL  URINE RAPID DRUG SCREEN (HOSP PERFORMED)  CBC WITH DIFFERENTIAL/PLATELET  BASIC METABOLIC PANEL     Imaging Review No results found.   EKG Interpretation None      MDM   Final diagnoses:  Alcohol abuse  Passive suicidal ideations    Patient presents acutely intoxicated with passive SI. Tachycardic on exam but otherwise vital signs stable. Patient has a history of depression and admission for alcohol detox. Patient placed on CIWA protocol.  Lab work obtained. Patient given a normal saline bolus and a banana bag of fluids with multivitamins and thiamine. Will reassess when clinically sober and can be evaluated by TTS.  Shon Batonourtney F Horton, MD 10/12/14 [phone]

## 2014-10-12 NOTE — ED Notes (Signed)
wellbutrin and tegretol d/c'd d/t pt states has not taken in a long while and does not want to take
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
## 2014-10-13 DIAGNOSIS — F1010 Alcohol abuse, uncomplicated: Secondary | ICD-10-CM
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
## 2014-10-13 NOTE — ED Notes (Signed)
TTS Complete 

## 2014-10-13 NOTE — Discharge Instructions (Signed)
Alcohol Use Disorder °Alcohol use disorder is a mental disorder. It is not a one-time incident of heavy drinking. Alcohol use disorder is the excessive and uncontrollable use of alcohol over time that leads to problems with functioning in one or more areas of daily living. People with this disorder risk harming themselves and others when they drink to excess. Alcohol use disorder also can cause other mental disorders, such as mood and anxiety disorders, and serious physical problems. People with alcohol use disorder often misuse other drugs.  °Alcohol use disorder is common and widespread. Some people with this disorder drink alcohol to cope with or escape from negative life events. Others drink to relieve chronic pain or symptoms of mental illness. People with a family history of alcohol use disorder are at higher risk of losing control and using alcohol to excess.  °SYMPTOMS  °Signs and symptoms of alcohol use disorder may include the following:  °· Consumption of alcohol in larger amounts or over a longer period of time than intended. °· Multiple unsuccessful attempts to cut down or control alcohol use.   °· A great deal of time spent obtaining alcohol, using alcohol, or recovering from the effects of alcohol (hangover). °· A strong desire or urge to use alcohol (cravings).   °· Continued use of alcohol despite problems at work, school, or home because of alcohol use.   °· Continued use of alcohol despite problems in relationships because of alcohol use. °· Continued use of alcohol in situations when it is physically hazardous, such as driving a car. °· Continued use of alcohol despite awareness of a physical or psychological problem that is likely related to alcohol use. Physical problems related to alcohol use can involve the brain, heart, liver, stomach, and intestines. Psychological problems related to alcohol use include intoxication, depression, anxiety, psychosis, delirium, and dementia.   °· The need for  increased amounts of alcohol to achieve the same desired effect, or a decreased effect from the consumption of the same amount of alcohol (tolerance). °· Withdrawal symptoms upon reducing or stopping alcohol use, or alcohol use to reduce or avoid withdrawal symptoms. Withdrawal symptoms include: °· Racing heart. °· Hand tremor. °· Difficulty sleeping. °· Nausea. °· Vomiting. °· Hallucinations. °· Restlessness. °· Seizures. °DIAGNOSIS °Alcohol use disorder is diagnosed through an assessment by your health care provider. Your health care provider may start by asking three or four questions to screen for excessive or problematic alcohol use. To confirm a diagnosis of alcohol use disorder, at least two symptoms must be present within a 12-month period. The severity of alcohol use disorder depends on the number of symptoms: °· Mild--two or three. °· Moderate--four or five. °· Severe--six or more. °Your health care provider may perform a physical exam or use results from lab tests to see if you have physical problems resulting from alcohol use. Your health care provider may refer you to a mental health professional for evaluation. °TREATMENT  °Some people with alcohol use disorder are able to reduce their alcohol use to low-risk levels. Some people with alcohol use disorder need to quit drinking alcohol. When necessary, mental health professionals with specialized training in substance use treatment can help. Your health care provider can help you decide how severe your alcohol use disorder is and what type of treatment you need. The following forms of treatment are available:  °· Detoxification. Detoxification involves the use of prescription medicines to prevent alcohol withdrawal symptoms in the first week after quitting. This is important for people with a history of symptoms   of withdrawal and for heavy drinkers who are likely to have withdrawal symptoms. Alcohol withdrawal can be dangerous and, in severe cases, cause  death. Detoxification is usually provided in a hospital or in-patient substance use treatment facility.  Counseling or talk therapy. Talk therapy is provided by substance use treatment counselors. It addresses the reasons people use alcohol and ways to keep them from drinking again. The goals of talk therapy are to help people with alcohol use disorder find healthy activities and ways to cope with life stress, to identify and avoid triggers for alcohol use, and to handle cravings, which can cause relapse.  Medicines.Different medicines can help treat alcohol use disorder through the following actions:  Decrease alcohol cravings.  Decrease the positive reward response felt from alcohol use.  Produce an uncomfortable physical reaction when alcohol is used (aversion therapy).  Support groups. Support groups are run by people who have quit drinking. They provide emotional support, advice, and guidance. These forms of treatment are often combined. Some people with alcohol use disorder benefit from intensive combination treatment provided by specialized substance use treatment centers. Both inpatient and outpatient treatment programs are available. Document Released: 08/16/2004 Document Revised: 11/23/2013 Document Reviewed: 10/16/2012 [facility] Patient Information 2015 [place], [place]. This information is not intended to replace advice given to you by your health care provider. Make sure you discuss any questions you have with your health care provider.  Suicidal Feelings, How to Help Yourself Everyone feels sad or unhappy at times, but depressing thoughts and feelings of hopelessness can lead to thoughts of suicide. It can seem as if life is too tough to handle. If you feel as though you have reached the point where suicide is the only answer, it is time to let someone know immediately.  HOW TO COPE AND PREVENT SUICIDE  Let family, friends, teachers, or counselors know. Get help. Try not to isolate  yourself from those who care about you. Even though you may not feel sociable, talk with someone every day. It is best if it is face-to-face. Remember, they will want to help you.  Eat a regularly spaced and well-balanced diet.  Get plenty of rest.  Avoid alcohol and drugs because they will only make you feel worse and may also lower your inhibitions. Remove them from the home. If you are thinking of taking an overdose of your prescribed medicines, give your medicines to someone who can give them to you one day at a time. If you are on antidepressants, let your caregiver know of your feelings so he or she can provide a safer medicine, if that is a concern.  Remove weapons or poisons from your home.  Try to stick to routines. Follow a schedule and remind yourself that you have to keep that schedule every day.  Set some realistic goals and achieve them. Make a list and cross things off as you go. Accomplishments give a sense of worth. Wait until you are feeling better before doing things you find difficult or unpleasant to do.  If you are able, try to start exercising. Even half-hour periods of exercise each day will make you feel better. Getting out in the sun or into nature helps you recover from depression faster. If you have a favorite place to walk, take advantage of that.  Increase safe activities that have always given you pleasure. This may include playing your favorite music, reading a good book, painting a picture, or playing your favorite instrument. Do whatever takes your mind  off your depression.  Keep your living space well-lighted. GET HELP Contact a suicide hotline, crisis center, or local suicide prevention center for help right away. Local centers may include a hospital, clinic, community service organization, social service provider, or health department.  Call your local emergency services (911 in the [place]).  Call a suicide hotline:  1-800-273-TALK  (920-426-20751-3040033461) in the [place].  1-800-SUICIDE (986)863-4652(1-985-177-1516) in the [place].  743 569 [phone] in the [place] for Spanish-speaking counselors.  4-696-295-2WUX1-800-799-4TTY 639-002-1692(1-(705)454-1051) in the [place] for TTY users.  Visit the following websites for information and help:  National Suicide Prevention Lifeline: www.suicidepreventionlifeline.org  Hopeline: www.hopeline.com  [employer] for Suicide Prevention: https://www.ayers.com/www.afsp.org  For lesbian, gay, bisexual, transgender, or questioning youth, contact The [employer] Project:  3-664-4-I-HKVQQV1-866-4-U-TREVOR 918-421-9066(1-251-407-7796) in the [place].  www.thetrevorproject.org  In [place], treatment resources are listed in each province with listings available under [employer] for [employer] Services or similar titles. Another source for Crisis Centres by [place] is located at http://www.suicideprevention.ca/in-crisis-now/find-a-crisis-centre-now/crisis-centres Document Released: 01/13/2003 Document Revised: 10/01/2011 Document Reviewed: 11/03/2013 [facility] Patient Information 2015 [place], [place]. This information is not intended to replace advice given to you by your health care provider. Make sure you discuss any questions you have with your health care provider.   Emergency Department Resource Guide 1) Find a Doctor and Pay Out of Pocket Although you won't have to find out who is covered by your insurance plan, it is a good idea to ask around and get recommendations. You will then need to call the office and see if the doctor you have chosen will accept you as a new patient and what types of options they offer for patients who are self-pay. Some doctors offer discounts or will set up payment plans for their patients who do not have insurance, but you will need to ask so you aren't surprised when you get to your appointment.  2) Contact Your Local Health Department Not all health departments have doctors that can see patients for sick visits, but  many do, so it is worth a call to see if yours does. If you don't know where your local health department is, you can check in your phone book. The CDC also has a tool to help you locate your state's health department, and many state websites also have listings of all of their local health departments.  3) Find a Walk-in Clinic If your illness is not likely to be very severe or complicated, you may want to try a walk in clinic. These are popping up all over the country in pharmacies, drugstores, and shopping centers. They're usually staffed by nurse practitioners or physician assistants that have been trained to treat common illnesses and complaints. They're usually fairly quick and inexpensive. However, if you have serious medical issues or chronic medical problems, these are probably not your best option.  No Primary Care Doctor: - Call Health Connect at  [phone] - they can help you locate a primary care doctor that  accepts your insurance, provides certain services, etc. - Physician Referral Service- [phone]  Chronic Pain Problems: Organization         Address  Phone   Notes  [facility]  [phone] Patients need to be referred by their primary care doctor.   Medication Assistance: Organization         Address  Phone   Notes  [facility] [address] [place], [place] [phone] --Must  be a resident of [facility] -- Must have NO insurance coverage whatsoever (no Medicaid/ Medicare, etc.) -- The pt. MUST have a primary care doctor that directs their care regularly and follows them in the community   MedAssist  [phone]   [employer]  [phone]    Agencies that provide inexpensive medical care: Organization         Address  Phone   Notes  [facility]  [phone]   [facility]    [phone]   [facility] [address] [place], [place] [address] [phone]   [facility] [address] [place] [place] [phone]   Planned Parenthood    [phone]   [facility]    [phone]   [facility]  [address], [place] Phone:  [phone], Fax:  [phone] Hours of Operation:  9 am - 6 pm, M-F.  Also accepts Medicaid/Medicare and self-pay.  [facility]  [address], [place] Phone: [phone], Fax: [phone]. Hours of Operation:  8:30 am - 5:30 pm, M-F.  Also accepts Medicaid and self-pay.  [facility] [place] [address] [place] Phone: [phone]   [facility] [address] Natasha Bence [place], [place] [phone], Ext. [phone] Mondays & Thursdays: 7-9 AM.  First 15 patients are seen on a first come, first serve basis.    Medicaid-accepting [facility] Providers:  Organization         Address  Phone   Notes  [facility] [address], [place] [phone] Also accepts self-pay patients.  [facility] [address] Laurell Josephs [place], [place]  [phone]   [facility] [address], [place] [phone]   [facility] [address] [place] [phone]   Renaye Rakers [address], [place]   [phone] Only accepts [place] Access [place] patients after they have their name applied to their card.   Self-Pay (no insurance) in [facility]:  Organization         Address  Phone   Notes  Sickle Cell Patients, [facility] [address] [place], [place] [phone]   [facility] [address] [place], [place] [phone]   [facility] [place]  [address], [place] [phone]   [facility]/Dr. Osei-Bonsu  [address], [place] or  [address], [place] [phone] Phone number for both [place] and [place] locations is the same.  [facility] [address] [place] [phone]   [facility] [address], [place] or [address] [phone]   [facility] [address], [place] [phone], phone; [phone], fax Sees patients 1st and 3rd Saturday of every month.  Must not qualify for public or private insurance (i.e. Medicaid, Medicare, [place] Health Choice, Veterans' Benefits)  Household income should be no more than 200% of the poverty level The clinic cannot treat you if you are pregnant or think you are pregnant  Sexually transmitted diseases are not treated at the clinic.    Dental Care: Organization  Address  Phone  Notes  [facility] [place], [place] [phone] Accepts children up to age 43 who are enrolled in [place] or [place]; pregnant women with a Medicaid card; and children who have applied for Medicaid or [place] Health Choice, but were declined, whose parents can pay a reduced fee at time of service.  [facility] Department of [facility]  [address], [place] [phone] Accepts children up to age 26 who are enrolled in [place] or [place]; pregnant women with a Medicaid card; and children who have applied for Medicaid or [place] Health Choice, but were declined, whose parents can pay a reduced fee at time of service.  [place] Adult Dental Access PROGRAM  [place] [phone] Patients are seen by appointment only. Walk-ins are not accepted. [place] will see patients 78 years of age and older. Monday - Tuesday (8am-5pm) Most Wednesdays (8:30-5pm) $30 per visit, cash only  [facility] Adult Dental Access PROGRAM  [address], [facility] [phone] Patients are seen by appointment only. Walk-ins are not accepted. [place] will see patients 2 years of age and older. One Wednesday Evening (Monthly: Volunteer Based).  $30 per visit, cash only  [place]  [phone] for adults; Children under age 62, call Graduate Pediatric Dentistry at [phone]. Children aged 14-14, please call [phone] to request a pediatric application.  Dental services are provided in all areas of dental care including fillings, crowns and [place], complete and partial dentures, implants, gum treatment, root canals, and extractions. Preventive care is also provided. Treatment is provided to both adults and children. Patients are selected via a lottery and there is often a waiting list.   [facility] [address] [place]  [phone] www.drcivils.com   [facility] [address] [place], [place] [phone], Ext. [phone] Second and Fourth Thursday of each month, opens at 6:30 AM; Clinic ends at 9 AM.  Patients are seen on a first-come first-served basis, and a limited number are seen during each clinic.   [facility]  [address] Hillard Danker [place], [place] [phone]   Eligibility Requirements You must have lived in [place], [place], or [place] counties for at least the last three months.   You cannot be eligible for state or federal sponsored [employer], including [employer], [place], or [employer].   You generally cannot be eligible for healthcare insurance through your employer.    How to apply: Eligibility screenings are held every Tuesday and Wednesday afternoon from 1:00 pm until 4:00 pm. You do not need an appointment for the interview!  [facility] [address], [place], [place]   [place]  [place] Department  [place]  [phone]    Behavioral Health Resources in the Community: Intensive Outpatient Programs Organization         Address  Phone  Notes  [place]. [address], [place], [place] [phone]   [facility] Outpatient [address] [place], [place]   ADS: Alcohol & Drug Svcs [address], [place], [place]   [place] [address],  [place], [place] or [phone]   Substance Abuse Resources  Organization         Address  Phone  Notes  Alcohol and Drug Services  [place]  [phone]   [facility] [place]  [phone]   Chinita Pester  [phone]   Residential & Outpatient Substance Abuse Program  [phone]   Psychological Services Organization         Address  Phone  Notes  [facility] [place]  [place]  [phone]   [place] [address], [place] or [phone]    Mobile Crisis Teams Organization         Address  Phone  Notes  [facility], Mobile Crisis Care Unit  [phone]   [facility]  [address] [place], [place]   [place] Levels [address], [place] [phone]    Self-Help/Support Groups Organization         Address  Phone             Notes  [place]. of [place] - variety of support groups  [place] Call for more information  Narcotics Anonymous (NA), [facility] [address], [employer] [place]  2 meetings at this location   Special educational needs teacher         Address  Phone  Notes  [facility] [place],    [place]  [phone]   [facility]  [address], [place] [phone], [place], [place]   [place], [place] [phone]  Admissions: 8am-3pm M-F  Incentives Substance [place] [address],    [place], [place] [phone]   [facility] [address] [place], [place], [place]   The [facility] [address]  [place], [place]   [facility] Programs - Intensive Outpatient [address] Kristeen Mans [address], [place], [place]   [facility] [place].,  [place], [place] [phone] or [phone]   [facility] (RTS) [address] [place], [place] Accepts Medicaid  [facility] [address]  [place] [phone] Substance Abuse/Addiction Treatment   [facility] Organization         Address  Phone  Notes  [facility]  [phone]   Domenic Schwab, PhD [address] Arlis Porta [place], [place]   [phone] or [phone]   [place], [place] [phone]   [facility] [address] [place], [place] [phone] Insurance/Medicaid/sponsorship through [facility] and Families [address]                                    [place], [place] [phone] [place] [address] [place], [place] [phone]    Dr. Adele Schilder  [phone]   [facility] Dept. 1) [address], [place] 2) [place] 3)  [place] [address], [place] [phone]  [phone]   [place] [phone] or [phone] (After Hours)

## 2014-10-13 NOTE — ED Notes (Signed)
TTS at bedside. 

## 2014-10-13 NOTE — ED Provider Notes (Signed)
12:30 PM Psychiatry has assessed and feels pt is appropriate for outpatient treatment.  His vital signs have stabilized.  He is well appearing.  He denies SI or HI. Have given resources.   Clinical Impression: 1. Alcohol abuse     George DivineJohn Kathyrn [place], MD 10/13/14 1230

## 2015-01-21 ENCOUNTER — Emergency Department (HOSPITAL_COMMUNITY)
Admission: EM | Admit: 2015-01-21 | Discharge: 2015-01-21 | Disposition: A | Discharge status: Home / Self Care | Payer: Self-pay | Attending: Emergency Medicine | Admitting: Emergency Medicine

## 2015-01-21 ENCOUNTER — Encounter (HOSPITAL_COMMUNITY): Payer: Self-pay | Admitting: Neurology

## 2015-01-21 DIAGNOSIS — F329 Major depressive disorder, single episode, unspecified: Secondary | ICD-10-CM | POA: Insufficient documentation

## 2015-01-21 DIAGNOSIS — I10 Essential (primary) hypertension: Secondary | ICD-10-CM | POA: Insufficient documentation

## 2015-01-21 DIAGNOSIS — Z79899 Other long term (current) drug therapy: Secondary | ICD-10-CM | POA: Insufficient documentation

## 2015-01-21 DIAGNOSIS — R000 Tachycardia, unspecified: Secondary | ICD-10-CM | POA: Insufficient documentation

## 2015-01-21 DIAGNOSIS — Z720 Tobacco use: Secondary | ICD-10-CM | POA: Insufficient documentation

## 2015-01-21 DIAGNOSIS — L600 Ingrowing nail: Secondary | ICD-10-CM | POA: Insufficient documentation

## 2015-01-21 MED ORDER — AMOXICILLIN-POT CLAVULANATE 875-125 MG PO TABS: 1.0000 | ORAL_TABLET | Freq: Two times a day (BID) | ORAL | Status: DC

## 2015-01-21 MED ORDER — AMOXICILLIN-POT CLAVULANATE 875-125 MG PO TABS
1.0000 | ORAL_TABLET | Freq: Once | ORAL | Status: AC
  Administered 2015-01-21: 1 via ORAL
  Filled 2015-01-21: qty 1

## 2015-01-21 NOTE — Discharge Instructions (Signed)
Ingrown Toenail Soak the foot in E[place] salt. Take anabiotic says prescribed. An ingrown toenail occurs when the sharp edge of your toenail grows into the skin. Causes of ingrown toenails include toenails clipped too far back or poorly fitting shoes. Activities involving sudden stops (basketball, tennis) causing "toe jamming" may lead to an ingrown nail. HOME CARE INSTRUCTIONS   Soak the whole foot in warm soapy water for 20 minutes, 3 times per day.  You may lift the edge of the nail away from the sore skin by wedging a small piece of cotton under the corner of the nail. Be careful not to dig (traumatize) and cause more injury to the area.  Wear shoes that fit well. While the ingrown nail is causing problems, sandals may be beneficial.  Trim your toenails regularly and carefully. Cut your toenails straight across, not in a curve. This will prevent injury to the skin at the corners of the toenail.  Keep your feet clean and dry.  Crutches may be helpful early in treatment if walking is painful.  Antibiotics, if prescribed, should be taken as directed.  Return for a wound check in 2 days or as directed.  Only take over-the-counter or prescription medicines for pain, discomfort, or fever as directed by your caregiver. SEEK IMMEDIATE MEDICAL CARE IF:   You have a fever.  You have increasing pain, redness, swelling, or heat at the wound site.  Your toe is not better in 7 days. If conservative treatment is not successful, surgical removal of a portion or all of the nail may be necessary. MAKE SURE YOU:   Understand these instructions.  Will watch your condition.  Will get help right away if you are not doing well or get worse. Document Released: 07/06/2000 Document Revised: 10/01/2011 Document Reviewed: 06/30/2008 [facility] Patient Information 2015 [place], [place]. This information is not intended to replace advice given to you by your health care provider. Make sure you discuss any  questions you have with your health care provider.

## 2015-01-21 NOTE — ED Provider Notes (Signed)
CSN: [id number]     Arrival date & time 01/21/15  1139 History   First MD Initiated Contact with Patient 01/21/15 1155     Chief Complaint  Patient presents with  . Foot Pain     (Consider location/radiation/quality/duration/timing/severity/associated sxs/prior Treatment) HPI [name] is a [age] year old male with a history of alcohol abuse and hypertension who presents forfoot pain and left great toe ingrown toenail for the past 10 days. He states he just recently started working again and stands on his feet for 14 hours a day working as a [employer]. He states the ball of his foot is achy. He is taken ibuprofen with minimal relief. He states it is worse with standing. He denies any injury or trauma to the feet. He denies any fever, chills. Past Medical History  Diagnosis Date  . Alcohol abuse   . Depression   . Hypertension    Past Surgical History  Procedure Laterality Date  . Fracture surgery     No family history on file. History  Substance Use Topics  . Smoking status: Current Some Day Smoker    Types: Cigars  . Smokeless tobacco: Not on file  . Alcohol Use: 0.0 oz/week    12-18 Cans of beer per week     Comment: heavy drinker    Review of Systems  Constitutional: Negative for fever.  Musculoskeletal: Positive for arthralgias.  Skin: Negative for wound.  All other systems reviewed and are negative.     Allergies  Cashew nut oil  Home Medications   Prior to Admission medications   Medication Sig Start Date End Date Taking? Authorizing Provider  amoxicillin-clavulanate (AUGMENTIN) 875-125 MG per tablet Take 1 tablet by mouth every 12 (twelve) hours. 01/21/15   Emberleigh Reily Patel-Mills, PA-C  buPROPion (WELLBUTRIN XL) 300 MG 24 hr tablet Take 1 tablet (300 mg total) by mouth daily. Patient not taking: Reported on 10/12/2014 10/16/13   Rachael FeeIrving A Lugo, MD  carbamazepine (TEGRETOL XR) 200 MG 12 hr tablet Take 1 tablet (200 mg total) by mouth at bedtime. Patient not taking:  Reported on 10/12/2014 10/16/13   Rachael FeeIrving A Lugo, MD  hydrOXYzine (ATARAX/VISTARIL) 25 MG tablet Take 1 tablet (25 mg total) by mouth every 6 (six) hours as needed for anxiety (or CIWA score </= 10). Patient not taking: Reported on 10/12/2014 10/16/13   Rachael FeeIrving A Lugo, MD  Multiple Vitamin (MULTIVITAMIN WITH MINERALS) TABS tablet Take 1 tablet by mouth daily. 10/16/13   Rachael FeeIrving A Lugo, MD  traZODone (DESYREL) 50 MG tablet Take 1 tablet (50 mg total) by mouth at bedtime as needed and may repeat dose one time if needed for sleep. For sleep Patient not taking: Reported on 10/12/2014 10/16/13   Rachael FeeIrving A Lugo, MD   BP 143/81 mmHg  Pulse 113  Temp(Src) 98.5 F (36.9 C) (Oral)  Resp 20  SpO2 94% Physical Exam  Constitutional: He is oriented to person, place, and time. He appears well-developed and well-nourished.  HENT:  Head: Normocephalic and atraumatic.  Eyes: Conjunctivae are normal.  Cardiovascular: Tachycardia present.   Pulmonary/Chest: Effort normal.  Abdominal: There is no tenderness.  Musculoskeletal: Normal range of motion.  Feet: Bilateral blisters on the plantar surface of the foot below the second toe.  No corns or calluses. He has an ingrown first left toenail with surrounding erythema and mild tenderness on the left tide of the toe. No drainage.   Neurological: He is alert and oriented to person, place, and time.  Skin: Skin  is warm and dry.  Psychiatric: He has a normal mood and affect. His behavior is normal.  Nursing note and vitals reviewed.   ED Course  Procedures (including critical care time) Labs Review Labs Reviewed - No data to display  Imaging Review No results found.   EKG Interpretation None      MDM   Final diagnoses:  Ingrown left big toenail  Patient originally came in tachycardic after drinking a monster in the lobby. His heart rate has since come down and is not concerning. Patient has no significant tenderness of the ingrown toenail but does have mild  surrounding erythema. I gave the patient is first dose of Augmentin the ED and wrote a prescription for Augmentin.I also explained that he should soak the foot in Epsom salt. Patient verbally agrees with the plan. Patient also requested work note which are provided.     Catha Gosselin, PA-C 01/21/15 1237  Donnetta Hutching, MD 01/21/15 1550

## 2015-01-21 NOTE — ED Notes (Signed)
Pt noted to be drinking a Monster energy drink, pts HR 123

## 2015-01-21 NOTE — ED Notes (Signed)
Pt reports blister to bottom of foot beside great toes on right and left feet. Drained them this week and got some relief but the blisters came back. 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

## 2015-05-29 ENCOUNTER — Emergency Department (HOSPITAL_COMMUNITY): Payer: Self-pay

## 2015-05-29 ENCOUNTER — Encounter (HOSPITAL_COMMUNITY): Payer: Self-pay | Admitting: *Deleted

## 2015-05-29 ENCOUNTER — Emergency Department (HOSPITAL_COMMUNITY)
Admission: EM | Admit: 2015-05-29 | Discharge: 2015-05-29 | Disposition: A | Discharge status: Home / Self Care | Payer: Self-pay | Attending: Emergency Medicine | Admitting: Emergency Medicine

## 2015-05-29 DIAGNOSIS — F446 Conversion disorder with sensory symptom or deficit: Secondary | ICD-10-CM | POA: Insufficient documentation

## 2015-05-29 DIAGNOSIS — I10 Essential (primary) hypertension: Secondary | ICD-10-CM | POA: Insufficient documentation

## 2015-05-29 DIAGNOSIS — Z79899 Other long term (current) drug therapy: Secondary | ICD-10-CM | POA: Insufficient documentation

## 2015-05-29 DIAGNOSIS — R200 Anesthesia of skin: Secondary | ICD-10-CM

## 2015-05-29 DIAGNOSIS — R531 Weakness: Secondary | ICD-10-CM | POA: Insufficient documentation

## 2015-05-29 DIAGNOSIS — G43109 Migraine with aura, not intractable, without status migrainosus: Secondary | ICD-10-CM | POA: Insufficient documentation

## 2015-05-29 DIAGNOSIS — Z720 Tobacco use: Secondary | ICD-10-CM | POA: Insufficient documentation

## 2015-05-29 DIAGNOSIS — F329 Major depressive disorder, single episode, unspecified: Secondary | ICD-10-CM | POA: Insufficient documentation

## 2015-05-29 DIAGNOSIS — R000 Tachycardia, unspecified: Secondary | ICD-10-CM | POA: Insufficient documentation

## 2015-05-29 LAB — CBC
HEMATOCRIT: 47.5 % (ref 39.0–52.0)
Hemoglobin: 16.7 g/dL (ref 13.0–17.0)
MCH: 31.2 pg (ref 26.0–34.0)
MCHC: 35.2 g/dL (ref 30.0–36.0)
MCV: 88.8 fL (ref 78.0–100.0)
Platelets: 201 10*3/uL (ref 150–400)
RBC: 5.35 MIL/uL (ref 4.22–5.81)
RDW: 12.6 % (ref 11.5–15.5)
WBC: 5 10*3/uL (ref 4.0–10.5)

## 2015-05-29 LAB — DIFFERENTIAL
Basophils Absolute: 0 10*3/uL (ref 0.0–0.1)
Basophils Relative: 0 %
EOS PCT: 2 %
Eosinophils Absolute: 0.1 10*3/uL (ref 0.0–0.7)
LYMPHS PCT: 35 %
Lymphs Abs: 1.7 10*3/uL (ref 0.7–4.0)
MONO ABS: 0.5 10*3/uL (ref 0.1–1.0)
MONOS PCT: 9 %
NEUTROS ABS: 2.7 10*3/uL (ref 1.7–7.7)
Neutrophils Relative %: 54 %

## 2015-05-29 LAB — I-STAT CHEM 8, ED
BUN: 7 mg/dL (ref 6–20)
Calcium, Ion: 1.05 mmol/L — ABNORMAL LOW (ref 1.12–1.23)
Chloride: 102 mmol/L (ref 101–111)
Creatinine, Ser: 1 mg/dL (ref 0.61–1.24)
GLUCOSE: 110 mg/dL — AB (ref 65–99)
HCT: 52 % (ref 39.0–52.0)
HEMOGLOBIN: 17.7 g/dL — AB (ref 13.0–17.0)
Potassium: 3.9 mmol/L (ref 3.5–5.1)
Sodium: 142 mmol/L (ref 135–145)
TCO2: 25 mmol/L (ref 0–100)

## 2015-05-29 LAB — COMPREHENSIVE METABOLIC PANEL
ALK PHOS: 63 U/L (ref 38–126)
ALT: 47 U/L (ref 17–63)
ANION GAP: 12 (ref 5–15)
AST: 50 U/L — ABNORMAL HIGH (ref 15–41)
Albumin: 4.9 g/dL (ref 3.5–5.0)
BUN: 7 mg/dL (ref 6–20)
CALCIUM: 9.6 mg/dL (ref 8.9–10.3)
CO2: 27 mmol/L (ref 22–32)
CREATININE: 0.83 mg/dL (ref 0.61–1.24)
Chloride: 103 mmol/L (ref 101–111)
Glucose, Bld: 111 mg/dL — ABNORMAL HIGH (ref 65–99)
Potassium: 3.9 mmol/L (ref 3.5–5.1)
Sodium: 142 mmol/L (ref 135–145)
TOTAL PROTEIN: 8.2 g/dL — AB (ref 6.5–8.1)
Total Bilirubin: 0.6 mg/dL (ref 0.3–1.2)

## 2015-05-29 LAB — PROTIME-INR
INR: 1 (ref 0.00–1.49)
Prothrombin Time: 13.4 seconds (ref 11.6–15.2)

## 2015-05-29 LAB — I-STAT TROPONIN, ED: TROPONIN I, POC: 0 ng/mL (ref 0.00–0.08)

## 2015-05-29 LAB — APTT: aPTT: 29 seconds (ref 24–37)

## 2015-05-29 LAB — CBG MONITORING, ED: Glucose-Capillary: 115 mg/dL — ABNORMAL HIGH (ref 65–99)

## 2015-05-29 MED ORDER — PROCHLORPERAZINE EDISYLATE 5 MG/ML IJ SOLN
10.0000 mg | Freq: Four times a day (QID) | INTRAMUSCULAR | Status: DC | PRN
  Administered 2015-05-29: 10 mg via INTRAVENOUS
  Filled 2015-05-29: qty 2

## 2015-05-29 MED ORDER — DIPHENHYDRAMINE HCL 50 MG/ML IJ SOLN
25.0000 mg | Freq: Once | INTRAMUSCULAR | Status: AC
  Administered 2015-05-29: 25 mg via INTRAVENOUS
  Filled 2015-05-29: qty 1

## 2015-05-29 MED ORDER — SODIUM CHLORIDE 0.9 % IV BOLUS (SEPSIS)
1000.0000 mL | Freq: Once | INTRAVENOUS | Status: AC
Start: 2015-05-29 — End: 2015-05-29
  Administered 2015-05-29: 1000 mL via INTRAVENOUS

## 2015-05-29 MED ORDER — MAGNESIUM SULFATE 2 GM/50ML IV SOLN
2.0000 g | Freq: Once | INTRAVENOUS | Status: AC
  Administered 2015-05-29: 2 g via INTRAVENOUS
  Filled 2015-05-29: qty 50

## 2015-05-29 MED ORDER — METOCLOPRAMIDE HCL 5 MG/ML IJ SOLN
10.0000 mg | Freq: Once | INTRAMUSCULAR | Status: AC
  Administered 2015-05-29: 10 mg via INTRAVENOUS
  Filled 2015-05-29: qty 2

## 2015-05-29 MED ORDER — KETOROLAC TROMETHAMINE 30 MG/ML IJ SOLN
30.0000 mg | Freq: Once | INTRAMUSCULAR | Status: AC
  Administered 2015-05-29: 30 mg via INTRAVENOUS
  Filled 2015-05-29: qty 1

## 2015-05-29 NOTE — ED Notes (Signed)
Hyacinth MeekerMiller, MD with pt

## 2015-05-29 NOTE — ED Notes (Signed)
Pt reports new onset of L facial numbness today & 10am with confusion, pt presents to the ED today with no facial droop or slurred speech, pt reports HA & L facial numbness, reports daily ETOH use with last use at 2am this morning, no facial droop, equal bil hand grips, A&O x4, follows commands, speaks in complete sentences

## 2015-05-29 NOTE — ED Notes (Signed)
Per Hyacinth MeekerMiller, MD orders placed per protocol d/t neuro deficits without code stroke activation, Hyacinth MeekerMiller, MD to speak with neuro

## 2015-05-29 NOTE — ED Provider Notes (Signed)
CSN: [id number]     Arrival date & time 05/29/15  1101 History   First MD Initiated Contact with Patient 05/29/15 1211     Chief Complaint  Patient presents with  . Numbness     (Consider location/radiation/quality/duration/timing/severity/associated sxs/prior Treatment) HPI Comments: Patient is a 29 year old male who has a significant past medical history for daily alcohol consumption of 12-24 beers per day, multiple concussions at a young age and borderline hypertension who presents today with a 2 hour history of left-sided facial, arm and leg numbness with some associated weakness. When symptoms started around 10 AM this morning he found someone to bring him to the hospital and on the way here he became very confused. Approximately 1 hour after symptoms started patient developed a severe headache on the left side which he states is getting worse. He does not get headaches regularly and denies ever having a headache like this. No visual changes, speech or swallowing problems. He is still complaining of tingling and numbness and at this point does not feel like it is getting any worse but states the headache is getting worse. He does not take any medications regularly and denies any recent head trauma.  Patient is a 29 y.o. male presenting with weakness. The history is provided by the patient.  Weakness This is a new problem. The current episode started 1 to 2 hours ago. The problem occurs constantly. The problem has not changed since onset.Associated symptoms include headaches. Nothing aggravates the symptoms. Nothing relieves the symptoms. He has tried nothing for the symptoms.    Past Medical History  Diagnosis Date  . Alcohol abuse   . Depression   . Hypertension    Past Surgical History  Procedure Laterality Date  . Fracture surgery     No family history on file. Social History  Substance Use Topics  . Smoking status: Current Some Day Smoker    Types: Cigars  . Smokeless  tobacco: None  . Alcohol Use: 0.0 oz/week    12-18 Cans of beer per week     Comment: heavy drinker    Review of Systems  Neurological: Positive for weakness and headaches.  All other systems reviewed and are negative.     Allergies  Cashew nut oil  Home Medications   Prior to Admission medications   Medication Sig Start Date End Date Taking? Authorizing Provider  amoxicillin-clavulanate (AUGMENTIN) 875-125 MG per tablet Take 1 tablet by mouth every 12 (twelve) hours. 01/21/15   Hanna Patel-Mills, PA-C  buPROPion (WELLBUTRIN XL) 300 MG 24 hr tablet Take 1 tablet (300 mg total) by mouth daily. Patient not taking: Reported on 10/12/2014 10/16/13   Rachael FeeIrving A Lugo, MD  carbamazepine (TEGRETOL XR) 200 MG 12 hr tablet Take 1 tablet (200 mg total) by mouth at bedtime. Patient not taking: Reported on 10/12/2014 10/16/13   Rachael FeeIrving A Lugo, MD  hydrOXYzine (ATARAX/VISTARIL) 25 MG tablet Take 1 tablet (25 mg total) by mouth every 6 (six) hours as needed for anxiety (or CIWA score </= 10). Patient not taking: Reported on 10/12/2014 10/16/13   Rachael FeeIrving A Lugo, MD  Multiple Vitamin (MULTIVITAMIN WITH MINERALS) TABS tablet Take 1 tablet by mouth daily. 10/16/13   Rachael FeeIrving A Lugo, MD  traZODone (DESYREL) 50 MG tablet Take 1 tablet (50 mg total) by mouth at bedtime as needed and may repeat dose one time if needed for sleep. For sleep Patient not taking: Reported on 10/12/2014 10/16/13   Rachael FeeIrving A Lugo, MD   BP  139/90 mmHg  Pulse 104  Temp(Src) 98.4 F (36.9 C) (Oral)  Resp 0  Ht  (1.778 m)  Wt 174 lb 4 oz (79.039 kg)  BMI 25.00 kg/m2  SpO2 98% Physical Exam  Constitutional: He is oriented to person, place, and time. He appears well-developed and well-nourished. No distress.  Appears nervous  HENT:  Head: Normocephalic and atraumatic.  Mouth/Throat: Oropharynx is clear and moist.  Eyes: Conjunctivae and EOM are normal. Pupils are equal, round, and reactive to light.  Neck: Normal range of motion.  Neck supple.  Cardiovascular: Regular rhythm and intact distal pulses.  Tachycardia present.   No murmur heard. Pulmonary/Chest: Effort normal and breath sounds normal. No respiratory distress. He has no wheezes. He has no rales.  Abdominal: Soft. He exhibits no distension. There is no tenderness. There is no rebound and no guarding.  Musculoskeletal: Normal range of motion. He exhibits no edema or tenderness.  Neurological: He is alert and oriented to person, place, and time. A sensory deficit is present. No cranial nerve deficit. Gait normal.  Subjective decreased sensation in the left face, arm and leg.  4/5 strength in the left lower leg but 5/5 strength in the rest of extremities.  No visual field cuts.  Normal heel to shin and finger to nose testing.  Skin: Skin is warm and dry. No rash noted. No erythema.  Psychiatric: He has a normal mood and affect. His behavior is normal.  Nursing note and vitals reviewed.   ED Course  Procedures (including critical care time) Labs Review Labs Reviewed  COMPREHENSIVE METABOLIC PANEL - Abnormal; Notable for the following:    Glucose, Bld 111 (*)    Total Protein 8.2 (*)    AST 50 (*)    All other components within normal limits  CBG MONITORING, ED - Abnormal; Notable for the following:    Glucose-Capillary 115 (*)    All other components within normal limits  I-STAT CHEM 8, ED - Abnormal; Notable for the following:    Glucose, Bld 110 (*)    Calcium, Ion 1.05 (*)    Hemoglobin 17.7 (*)    All other components within normal limits  PROTIME-INR  APTT  CBC  DIFFERENTIAL  I-STAT TROPOININ, ED  CBG MONITORING, ED    Imaging Review Mr Brain Wo Contrast  05/29/2015  CLINICAL DATA:  29 year old male with acute onset facial and left extremity numbness with confusion. Alcoholism. Initial encounter. EXAM: MRI HEAD WITHOUT CONTRAST TECHNIQUE: Multiplanar, multiecho pulse sequences of the brain and surrounding structures were obtained without  intravenous contrast. COMPARISON:  None. FINDINGS: Cerebral volume is within normal limits. No restricted diffusion to suggest acute infarction. No midline shift, mass effect, evidence of mass lesion, ventriculomegaly, extra-axial collection or acute intracranial hemorrhage. Cervicomedullary junction and pituitary are within normal limits. Major intracranial vascular flow voids are normal. There is mild tortuosity of the distal cervical ICAs. Wallace Cullens and white matter signal is within normal limits throughout the brain. Visible internal auditory structures appear normal. Mastoids and paranasal sinuses are clear. Negative orbit and scalp soft tissues. Negative visualized cervical spine. Normal bone marrow signal. IMPRESSION: Normal noncontrast MRI appearance of the brain. Electronically Signed   By: Odessa Fleming M.D.   On: 05/29/2015 13:53   I have personally reviewed and evaluated these images and lab results as part of my medical decision-making.   EKG Interpretation   Date/Time:  Sunday May 29 2015 12:17:52 EST Ventricular Rate:  106 PR Interval:  165  QRS Duration: 91 QT Interval:  320 QTC Calculation: 425 R Axis:   87 Text Interpretation:  Sinus tachycardia Left atrial enlargement No  significant change since last tracing Confirmed by [name]  MD, Alphonzo Lemmings  [phone]) on 05/29/2015 1:20:06 PM      MDM   Final diagnoses:  None    Patient is a 29 year old male with a history of multiple concussions and heavy alcohol use who presents today with symptoms concerning for a stroke. He states a proximally 10 AM he developed numbness tingling and weakness to the left face arm and leg with some associated confusion. Approximately 1 hour prior to arrival he developed a headache which is consistently worsening. Currently he denies feeling any confusion but still has some tingling and weakness on the left side. He denies ever having symptoms like this and does not get headaches regularly.  On exam he has  mild decreased sensation in the left arm and leg with 4 out of 5 strength in the left leg only. Dr. Amada Jupiter with neurology saw the patient and at this time he does not require TPA as the deficits are not severe but recommended an MRI for further evaluation. Question for stroke versus dissection versus complicated migraine. Patient given Benadryl and Reglan as a migraine cocktail. He drinks between 12 and 24 cans of beer per day for the last 2 years. The last drink was around 2 AM.  Currently denying any symptoms suggestive of withdrawal.  2:33 PM] MRI and lab test are without acute findings. Spoke with neurology and they feel most likely this is a complex migraine. Patient has had no improvement in symptoms after Reglan and Benadryl so we'll try Compazine, Toradol and magnesium.  3:55 PM Pt's headache is improving in addition to other sx also improving.  Feel most likely this is complicated migraine and pt d/ced home.  Gwyneth Sprout, MD 05/29/15 [phone]

## 2015-05-29 NOTE — ED Provider Notes (Signed)
MSE was initiated and I personally evaluated the patient and placed orders (if any) at  11:58 AM on May 29, 2015.  The patient appears stable so that the remainder of the MSE may be completed by another provider.  The pt is a 29 year old male, he is a daily alcoholic, trace excessively, states his last drink was approximately 2:00 in the morning, at work today at exactly 10:00 AM the patient developed facial numbness on the left, arm numbness on the left, leg numbness on the left and some confusion having difficulty finding his way to the hospital. He has been in detox multiple times in the past, he knows very well how to get to this hospital so it was frustrating for him. On exam the patient has normal cranial nerves III through XII, he does not have any facial droop, he does have one side of his mouth which seems to lag but it is definitely transient and he is able to give a full smile without difficulty. He has normal finger-nose-finger though he does appear somewhat flustered by his ability to do this. There is no ataxia of his arms, no asterixis, no tremor. He has normal speech, his memory is intact, his legs have normal strength at all major joints, his arms have normal strength in all major joints, he has decreased sensation to the left face, left arm, left leg which is slight.  Onset less than 2 hours ago, will discuss with neurology, vital signs show some hypertension, mild tachycardia, the patient will be moved to the back for the remainder of their evaluation and treatment to be completed by another provider.  D/w Dr. Amada JupiterKirkpatrick who will see the pt - 12:-00 PM  Eber HongBrian Tanikka Bresnan, MD 05/29/15 1201

## 2015-05-29 NOTE — Consult Note (Signed)
Neurology Consultation Reason for Consult: left sided numbness Referring Physician: Hyacinth MeekerMiller, B  CC: Left sided numbness  History is obtained from:patient  HPI: [name] is a 29 y.o. male with a history of hypertension and alcohol abuse who presents with left sided numbness and "not feeling quite right." he describes the numbness as starting in his face and then spreading to his arm and legs. He notes that he has had an occipital headache for a couple of days and then about an hour after his numbness started, he began having a left sided headache. He denies photo/phonophobia. He denies nausea.   He states that he had some mild confusion as well, getting lost on the way to the hospital.    LKW: 10 am tpa given?: no, mild symptoms    ROS: A 14 point ROS was performed and is negative except as noted in the HPI.   Past Medical History  Diagnosis Date  . Alcohol abuse   . Depression   . Hypertension      FHx: No history of clotting disorders.   Social History:  reports that he has been smoking Cigars.  He does not have any smokeless tobacco history on file. He reports that he drinks alcohol. He reports that he uses illicit drugs (Marijuana).   Exam: Current vital signs: BP 158/106 mmHg  Pulse 102  Temp(Src) 98.4 F (36.9 C) (Oral)  Resp 16  Ht 5\' 10"  (1.778 m)  Wt 79.039 kg (174 lb 4 oz)  BMI 25.00 kg/m2  SpO2 99% Vital signs in last 24 hours: Temp:  [98.4 F (36.9 C)] 98.4 F (36.9 C) (11/06 1135) Pulse Rate:  [102] 102 (11/06 1135) Resp:  [16] 16 (11/06 1135) BP: (158)/(106) 158/106 mmHg (11/06 1135) SpO2:  [99 %] 99 % (11/06 1135) Weight:  [79.039 kg (174 lb 4 oz)] 79.039 kg (174 lb 4 oz) (11/06 1135)  His systolics were in the 140s when I saw him.   Physical Exam  Constitutional: Appears well-developed and well-nourished.  Psych: Affect appropriate to situation Eyes: No scleral injection HENT: No OP obstrucion Head: Normocephalic.  Cardiovascular:  Normal rate and regular rhythm.  Respiratory: Effort normal and breath sounds normal to anterior ascultation GI: Soft.  No distension. There is no tenderness.  Skin: WDI  Neuro: Mental Status: Patient is awake, alert, oriented to person, place, month, year, and situation. Patient is able to give a clear and coherent history. No signs of aphasia or neglect Cranial Nerves: II: Visual Fields are full. Pupils are equal, round, and reactive to light.   III,IV, VI: EOMI without ptosis or diploplia.  V: Facial sensation is diminished on the left VII: Facial movement is symmetric.  VIII: hearing is intact to voice X: Uvula elevates symmetrically XI: Shoulder shrug is symmetric. XII: tongue is midline without atrophy or fasciculations.  Motor: Tone is normal. Bulk is normal. 5/5 strength was present on the right, he has no drift on the left, but to confrontation has mild weakness with some give-way component.  Sensory: Sensation is diminished on the left.  Cerebellar: FNF  intact bilaterally   I have reviewed labs in epic and the results pertinent to this consultation are: CBC - unremarkable Chem 8 - unremarkable.    Impression: 29 yo M with left sided paresthesias and left sided headache. Given the mild symptoms, I would not favor tPA even if this does represent acute stroke. If it is hemorrhage, treatment would be treating BP to a goal <  160 which he is already meeting, therefore I think it is reasonable to await imaging with MRI given the tech estimates it will be started within 30 minutes.   My suspicion is that this represents complicated migraine given the positive symptoms and timing of symptoms prior to headache  Recommendations: 1) MRI brain 2) UDS 3) further recommendations following imaging.    Ritta Slot, MD [facility] [phone]  If 7pm- 7am, please page neurology on call as listed in AMION.

## 2015-05-29 NOTE — ED Notes (Signed)
Hyacinth MeekerMiller, MD notified for eval re: pt symptoms

## 2015-05-29 NOTE — ED Notes (Signed)
Returned from MRI 

## 2015-05-29 NOTE — ED Notes (Signed)
Patient transported to MRI 

## 2015-05-29 NOTE — Discharge Instructions (Signed)

## 2019-02-24 ENCOUNTER — Encounter (HOSPITAL_COMMUNITY): Payer: Self-pay | Admitting: Emergency Medicine

## 2019-02-24 ENCOUNTER — Ambulatory Visit: Payer: Self-pay

## 2019-02-24 ENCOUNTER — Other Ambulatory Visit: Payer: Self-pay

## 2019-02-24 ENCOUNTER — Emergency Department (HOSPITAL_COMMUNITY)
Admission: EM | Admit: 2019-02-24 | Discharge: 2019-02-24 | Disposition: A | Discharge status: Home / Self Care | Payer: HRSA Program | Attending: Emergency Medicine | Admitting: Emergency Medicine

## 2019-02-24 DIAGNOSIS — Z20828 Contact with and (suspected) exposure to other viral communicable diseases: Secondary | ICD-10-CM | POA: Diagnosis not present

## 2019-02-24 DIAGNOSIS — U071 COVID-19: Secondary | ICD-10-CM | POA: Diagnosis not present

## 2019-02-24 DIAGNOSIS — R0602 Shortness of breath: Secondary | ICD-10-CM | POA: Diagnosis present

## 2019-02-24 DIAGNOSIS — Z79899 Other long term (current) drug therapy: Secondary | ICD-10-CM | POA: Insufficient documentation

## 2019-02-24 DIAGNOSIS — J069 Acute upper respiratory infection, unspecified: Secondary | ICD-10-CM | POA: Diagnosis not present

## 2019-02-24 DIAGNOSIS — I10 Essential (primary) hypertension: Secondary | ICD-10-CM | POA: Diagnosis not present

## 2019-02-24 DIAGNOSIS — F1290 Cannabis use, unspecified, uncomplicated: Secondary | ICD-10-CM | POA: Diagnosis not present

## 2019-02-24 HISTORY — DX: Unspecified asthma, uncomplicated: J45.909

## 2019-02-24 LAB — CBC WITH DIFFERENTIAL/PLATELET
Abs Immature Granulocytes: 0 10*3/uL (ref 0.00–0.07)
Basophils Absolute: 0.1 10*3/uL (ref 0.0–0.1)
Basophils Relative: 1 %
Eosinophils Absolute: 0.2 10*3/uL (ref 0.0–0.5)
Eosinophils Relative: 3 %
HCT: 50.5 % (ref 39.0–52.0)
Hemoglobin: 17 g/dL (ref 13.0–17.0)
Lymphocytes Relative: 24 %
Lymphs Abs: 1.2 10*3/uL (ref 0.7–4.0)
MCH: 32.9 pg (ref 26.0–34.0)
MCHC: 33.7 g/dL (ref 30.0–36.0)
MCV: 97.7 fL (ref 80.0–100.0)
Monocytes Absolute: 0.3 10*3/uL (ref 0.1–1.0)
Monocytes Relative: 6 %
Neutro Abs: 3.3 10*3/uL (ref 1.7–7.7)
Neutrophils Relative %: 66 %
Platelets: 308 10*3/uL (ref 150–400)
RBC: 5.17 MIL/uL (ref 4.22–5.81)
RDW: 11.9 % (ref 11.5–15.5)
WBC: 5 10*3/uL (ref 4.0–10.5)
nRBC: 0 % (ref 0.0–0.2)
nRBC: 1 /100 WBC — ABNORMAL HIGH

## 2019-02-24 LAB — BASIC METABOLIC PANEL
Anion gap: 14 (ref 5–15)
BUN: <5 mg/dL — ABNORMAL LOW (ref 6–20)
CO2: 25 mmol/L (ref 22–32)
Calcium: 9.8 mg/dL (ref 8.9–10.3)
Chloride: 99 mmol/L (ref 98–111)
Creatinine, Ser: 0.68 mg/dL (ref 0.61–1.24)
GFR calc Af Amer: >60 mL/min (ref >60)
GFR calc non Af Amer: >60 mL/min (ref >60)
Glucose, Bld: 106 mg/dL — ABNORMAL HIGH (ref 70–99)
Potassium: 4.7 mmol/L (ref 3.5–5.1)
Sodium: 138 mmol/L (ref 135–145)

## 2019-02-24 MED ORDER — HYDROCHLOROTHIAZIDE 12.5 MG PO TABS: 12.5000 mg | ORAL_TABLET | Freq: Every day | ORAL | 0 refills | Status: DC

## 2019-02-24 MED ORDER — ACETAMINOPHEN 325 MG PO TABS
650.0000 mg | ORAL_TABLET | Freq: Once | ORAL | Status: AC
Start: 2019-02-24 — End: 2019-02-24
  Administered 2019-02-24: 650 mg via ORAL
  Filled 2019-02-24: qty 2

## 2019-02-24 NOTE — ED Triage Notes (Signed)
Fever, small cough and not feeling good thought it was his asthma x 1 week

## 2019-02-24 NOTE — Discharge Instructions (Signed)
Please read attached information. If you experience any new or worsening signs or symptoms please return to the emergency room for evaluation. Please follow-up with your primary care provider or specialist as discussed. Please use medication prescribed only as directed and discontinue taking if you have any concerning signs or symptoms.   °

## 2019-02-24 NOTE — ED Provider Notes (Signed)
[facility]   CSN: [id number] Arrival date & time: 02/24/19  1335    History   Chief Complaint Chief Complaint  Patient presents with  . Fever  . Cough    HPI [name] is a 33 y.o. male.     HPI   33 year old male presents today with complaints of shortness of breath.  Patient notes over the last week he has felt tired as if he was developing a respiratory infection.  He notes minor cough, nonproductive.  He notes some minor shortness of breath, no severe shortness of breath, no chest pain.  He reports that the shortness of breath feels like his asthma has slightly worsened.  He is not using any albuterol at home.  He denies any close sick contacts.  He notes he works as a [employer].  He reports a history of hypertension but is not on hypertensive medications.  He denies any other significant past medical history.  Past Medical History:  Diagnosis Date  . Alcohol abuse   . Asthma   . Depression   . Hypertension     Patient Active Problem List   Diagnosis Date Noted  . Alcohol abuse   . MDD (major depressive disorder) 10/15/2013  . Alcohol dependence (HCC) 04/02/2013  . Social anxiety disorder 04/02/2013  . ALLERGIC RHINITIS 03/28/2007    Past Surgical History:  Procedure Laterality Date  . FRACTURE SURGERY          Home Medications    Prior to Admission medications   Medication Sig Start Date End Date Taking? Authorizing Provider  amoxicillin-clavulanate (AUGMENTIN) 875-125 MG per tablet Take 1 tablet by mouth every 12 (twelve) hours. Patient not taking: Reported on 05/29/2015 01/21/15   Patel-Mills, Lorelle FormosaHanna, PA-C  buPROPion (WELLBUTRIN XL) 300 MG 24 hr tablet Take 1 tablet (300 mg total) by mouth daily. Patient not taking: Reported on 10/12/2014 10/16/13   Rachael FeeLugo, Irving A, MD  carbamazepine (TEGRETOL XR) 200 MG 12 hr tablet Take 1 tablet (200 mg total) by mouth at bedtime. Patient not taking: Reported on  10/12/2014 10/16/13   Rachael FeeLugo, Irving A, MD  hydrochlorothiazide (HYDRODIURIL) 12.5 MG tablet Take 1 tablet (12.5 mg total) by mouth daily. 02/24/19   Jann Ra, Tinnie GensJeffrey, PA-C  hydrOXYzine (ATARAX/VISTARIL) 25 MG tablet Take 1 tablet (25 mg total) by mouth every 6 (six) hours as needed for anxiety (or CIWA score </= 10). Patient not taking: Reported on 10/12/2014 10/16/13   Rachael FeeLugo, Irving A, MD  Multiple Vitamin (MULTIVITAMIN WITH MINERALS) TABS tablet Take 1 tablet by mouth daily. 10/16/13   Rachael FeeLugo, Irving A, MD  traZODone (DESYREL) 50 MG tablet Take 1 tablet (50 mg total) by mouth at bedtime as needed and may repeat dose one time if needed for sleep. For sleep Patient not taking: Reported on 10/12/2014 10/16/13   Rachael FeeLugo, Irving A, MD    Family History No family history on file.  Social History Social History   Tobacco Use  . Smoking status: Current Some Day Smoker    Types: Cigars  . Smokeless tobacco: Never Used  Substance Use Topics  . Alcohol use: Yes    Alcohol/week: 12.0 - 18.0 standard drinks    Types: 12 - 18 Cans of beer per week    Comment: heavy drinker  . Drug use: Yes    Types: Marijuana     Allergies   Cashew nut oil   Review of Systems Review of Systems  All other  systems reviewed and are negative.    Physical Exam Updated Vital Signs BP (!) 173/126 (BP Location: Right Arm)   Pulse (!) 124   Temp 98 F (36.7 C) (Oral)   Resp 20   SpO2 99%   Physical Exam Vitals signs and nursing note reviewed.  Constitutional:      Appearance: He is well-developed.  HENT:     Head: Normocephalic and atraumatic.  Eyes:     General: No scleral icterus.       Right eye: No discharge.        Left eye: No discharge.     Conjunctiva/sclera: Conjunctivae normal.     Pupils: Pupils are equal, round, and reactive to light.  Neck:     Musculoskeletal: Normal range of motion.     Vascular: No JVD.     Trachea: No tracheal deviation.  Pulmonary:     Effort: Pulmonary effort is normal.  No respiratory distress.     Breath sounds: Normal breath sounds. No stridor. No wheezing.  Neurological:     Mental Status: He is alert and oriented to person, place, and time.     Coordination: Coordination normal.  Psychiatric:        Behavior: Behavior normal.        Thought Content: Thought content normal.        Judgment: Judgment normal.      ED Treatments / Results  Labs (all labs ordered are listed, but only abnormal results are displayed) Labs Reviewed  BASIC METABOLIC PANEL - Abnormal; Notable for the following components:      Result Value   Glucose, Bld 106 (*)    BUN <5 (*)    All other components within normal limits  NOVEL CORONAVIRUS, NAA (HOSPITAL ORDER, SEND-OUT TO REF LAB)  CBC WITH DIFFERENTIAL/PLATELET    EKG None  Radiology No results found.  Procedures Procedures (including critical care time)  Medications Ordered in ED Medications  acetaminophen (TYLENOL) tablet 650 mg (650 mg Oral Given 02/24/19 1504)     Initial Impression / Assessment and Plan / ED Course  I have reviewed the triage vital signs and the nursing notes.  Pertinent labs & imaging results that were available during my care of the patient were reviewed by me and considered in my medical decision making (see chart for details).        33 year old male presents today with complaints of shortness of breath.  He is very well-appearing in no acute distress.  He is tachycardic here and hypertensive.  His symptoms are consistent with a viral illness.  He has no signs or symptoms consistent with pulmonary embolism.  His oxygen is 99% with clear lung sounds, no associated chest pain.  Patient will have Cobra testing here.  Given he has clear lung sounds I do not feel this is an asthma exacerbation.  Patient also hypertensive here.  I will restart him on antihypertensive medication.  He will be discharged with symptomatic care and strict return precautions.  He verbalized understanding and  agreement to today's plan had no further questions or concerns.  Final Clinical Impressions(s) / ED Diagnoses   Final diagnoses:  Viral upper respiratory tract infection    ED Discharge Orders         Ordered    hydrochlorothiazide (HYDRODIURIL) 12.5 MG tablet  Daily     02/24/19 1628           Okey Regal, PA-C 02/24/19 1629    Tegeler,  Canary Brimhristopher J, MD 02/25/19 [phone]

## 2019-02-24 NOTE — Telephone Encounter (Signed)
Pt.'s girlfriend Vaughan Basta, calling. Reports pt. Is having cough, shortness of breath, fever, night sweats since Saturday. No PCP. Concerned about COVID 19. Encouraged pt. To go to ED for evaluation.

## 2019-02-24 NOTE — ED Notes (Signed)
Patient verbalizes understanding of discharge instructions . Opportunity for questions and answers were provided . Armband removed by staff ,Pt discharged from ED. W/C  offered at D/C  and Declined W/C at D/C and was escorted to lobby by RN.  

## 2019-02-25 LAB — NOVEL CORONAVIRUS, NAA (HOSP ORDER, SEND-OUT TO REF LAB; TAT 18-24 HRS): SARS-CoV-2, NAA: DETECTED — AB

## 2020-02-18 ENCOUNTER — Encounter (HOSPITAL_COMMUNITY): Payer: Self-pay

## 2020-02-18 ENCOUNTER — Other Ambulatory Visit: Payer: Self-pay

## 2020-02-18 ENCOUNTER — Emergency Department (HOSPITAL_COMMUNITY): Payer: Self-pay

## 2020-02-18 ENCOUNTER — Emergency Department (HOSPITAL_COMMUNITY)
Admission: EM | Admit: 2020-02-18 | Discharge: 2020-02-18 | Disposition: A | Discharge status: Home / Self Care | Payer: Self-pay | Attending: Emergency Medicine | Admitting: Emergency Medicine

## 2020-02-18 DIAGNOSIS — M25552 Pain in left hip: Secondary | ICD-10-CM | POA: Insufficient documentation

## 2020-02-18 DIAGNOSIS — J45909 Unspecified asthma, uncomplicated: Secondary | ICD-10-CM | POA: Insufficient documentation

## 2020-02-18 DIAGNOSIS — Z79899 Other long term (current) drug therapy: Secondary | ICD-10-CM | POA: Insufficient documentation

## 2020-02-18 DIAGNOSIS — F17290 Nicotine dependence, other tobacco product, uncomplicated: Secondary | ICD-10-CM | POA: Insufficient documentation

## 2020-02-18 DIAGNOSIS — I10 Essential (primary) hypertension: Secondary | ICD-10-CM | POA: Insufficient documentation

## 2020-02-18 MED ORDER — TRAMADOL HCL 50 MG PO TABS: 50.0000 mg | ORAL_TABLET | Freq: Four times a day (QID) | ORAL | 0 refills | Status: DC | PRN

## 2020-02-18 MED ORDER — PREDNISONE 10 MG PO TABS: 20.0000 mg | ORAL_TABLET | Freq: Two times a day (BID) | ORAL | 0 refills | Status: DC

## 2020-02-18 NOTE — ED Triage Notes (Signed)
Pt. C/o left sided hip pain for 1 month. Denies any injury. Ambulatory in triage.

## 2020-02-18 NOTE — ED Provider Notes (Addendum)
[facility]-EMERGENCY DEPT Provider Note   CSN: [id number] Arrival date & time: 02/18/20  1915     History Chief Complaint  Patient presents with  . Hip Pain    [name] is a 34 y.o. male.  Patient is a 34 year old male with past medical history of hypertension and asthma.  He presents today for evaluation of left hip pain.  Patient states this is been ongoing for the past month.  It began in the absence of any specific injury or trauma.  The pain he is experiencing is worse when he ambulates and bears weight and relieved somewhat with rest.  He denies any fevers or chills.  The history is provided by the patient.  Hip Pain This is a new problem. Episode onset: 1 month ago. The problem occurs constantly. The problem has been gradually worsening. The symptoms are aggravated by walking. The symptoms are relieved by rest. He has tried nothing for the symptoms.       Past Medical History:  Diagnosis Date  . Alcohol abuse   . Asthma   . Depression   . Hypertension     Patient Active Problem List   Diagnosis Date Noted  . Alcohol abuse   . MDD (major depressive disorder) 10/15/2013  . Alcohol dependence (HCC) 04/02/2013  . Social anxiety disorder 04/02/2013  . ALLERGIC RHINITIS 03/28/2007    Past Surgical History:  Procedure Laterality Date  . FRACTURE SURGERY         No family history on file.  Social History   Tobacco Use  . Smoking status: Current Some Day Smoker    Types: Cigars  . Smokeless tobacco: Never Used  Substance Use Topics  . Alcohol use: Yes    Alcohol/week: 12.0 - 18.0 standard drinks    Types: 12 - 18 Cans of beer per week    Comment: heavy drinker  . Drug use: Yes    Types: Marijuana    Home Medications Prior to Admission medications   Medication Sig Start Date End Date Taking? Authorizing Provider  amoxicillin-clavulanate (AUGMENTIN) 875-125 MG per tablet Take 1 tablet by mouth every 12 (twelve)  hours. Patient not taking: Reported on 05/29/2015 01/21/15   Patel-Mills, Lorelle Formosa, PA-C  buPROPion (WELLBUTRIN XL) 300 MG 24 hr tablet Take 1 tablet (300 mg total) by mouth daily. Patient not taking: Reported on 10/12/2014 10/16/13   Rachael Fee, MD  carbamazepine (TEGRETOL XR) 200 MG 12 hr tablet Take 1 tablet (200 mg total) by mouth at bedtime. Patient not taking: Reported on 10/12/2014 10/16/13   Rachael Fee, MD  hydrochlorothiazide (HYDRODIURIL) 12.5 MG tablet Take 1 tablet (12.5 mg total) by mouth daily. 02/24/19   Hedges, Tinnie Gens, PA-C  hydrOXYzine (ATARAX/VISTARIL) 25 MG tablet Take 1 tablet (25 mg total) by mouth every 6 (six) hours as needed for anxiety (or CIWA score </= 10). Patient not taking: Reported on 10/12/2014 10/16/13   Rachael Fee, MD  Multiple Vitamin (MULTIVITAMIN WITH MINERALS) TABS tablet Take 1 tablet by mouth daily. 10/16/13   Rachael Fee, MD  traZODone (DESYREL) 50 MG tablet Take 1 tablet (50 mg total) by mouth at bedtime as needed and may repeat dose one time if needed for sleep. For sleep Patient not taking: Reported on 10/12/2014 10/16/13   Rachael Fee, MD    Allergies    Cashew nut oil  Review of Systems   Review of Systems  All other systems reviewed and are negative.  Physical Exam Updated Vital Signs BP (!) 189/133 (BP Location: Left Arm)   Pulse (!) 126   Temp 98.3 F (36.8 C) (Oral)   Resp 18   Ht 5\' 10"  (1.778 m)   Wt 83.9 kg   SpO2 99%   BMI 26.54 kg/m   Physical Exam Vitals and nursing note reviewed.  Constitutional:      General: He is not in acute distress.    Appearance: Normal appearance. He is not ill-appearing.  HENT:     Head: Normocephalic and atraumatic.  Pulmonary:     Effort: Pulmonary effort is normal.  Musculoskeletal:     Comments: The left hip appears grossly normal.  He has good range of motion with no crepitus or significant discomfort.  Distal pulses, motor, and sensory are intact.  Pain is exacerbated with flexion  of the hip.  Skin:    General: Skin is warm and dry.  Neurological:     Mental Status: He is alert.     Sensory: Sensory deficit:       ED Results / Procedures / Treatments   Labs (all labs ordered are listed, but only abnormal results are displayed) Labs Reviewed - No data to display  EKG None  Radiology DG Hip Unilat With Pelvis 2-3 Views Left  Result Date: 02/18/2020 CLINICAL DATA:  34 year old male with left hip pain. No known injury. EXAM: DG HIP (WITH OR WITHOUT PELVIS) 2-3V LEFT COMPARISON:  None. FINDINGS: There is no evidence of hip fracture or dislocation. There is no evidence of arthropathy or other focal bone abnormality. IMPRESSION: Negative. Electronically Signed   By: 31 M.D.   On: 02/18/2020 19:59    Procedures Procedures (including critical care time)  Medications Ordered in ED Medications - No data to display  ED Course  I have reviewed the triage vital signs and the nursing notes.  Pertinent labs & imaging results that were available during my care of the patient were reviewed by me and considered in my medical decision making (see chart for details).    MDM Rules/Calculators/A&P  Patient presenting with complaints of atraumatic hip pain.  His x-rays show no significant abnormality.  Patient to be treated with a steroid course and tramadol.  He is to return as needed for any problems.  Patient is nontoxic-appearing and I highly doubt any acute process such as a septic hip or gout.  Final Clinical Impression(s) / ED Diagnoses Final diagnoses:  None    Rx / DC Orders ED Discharge Orders    None       02/20/2020, MD 02/18/20 02/20/20    Valrie Hart, MD 02/18/20 2009

## 2020-02-18 NOTE — ED Notes (Signed)
Patient here with c/o left hip pain.

## 2020-02-18 NOTE — Discharge Instructions (Addendum)
Begin taking prednisone as prescribed.  Take tramadol as prescribed as needed for pain.  Follow-up with your primary doctor if not improving in the next week, and return to the ER if you develop worsening pain, or other new and concerning symptoms.

## 2020-08-22 ENCOUNTER — Encounter (HOSPITAL_COMMUNITY): Payer: Self-pay

## 2020-08-22 ENCOUNTER — Emergency Department (HOSPITAL_COMMUNITY): Payer: 59

## 2020-08-22 ENCOUNTER — Other Ambulatory Visit: Payer: Self-pay

## 2020-08-22 ENCOUNTER — Inpatient Hospital Stay (HOSPITAL_COMMUNITY)
Admission: EM | Admit: 2020-08-22 | Discharge: 2020-08-29 | DRG: 682 | Disposition: A | Discharge status: Home / Self Care | Payer: 59 | Attending: Internal Medicine | Admitting: Internal Medicine

## 2020-08-22 ENCOUNTER — Inpatient Hospital Stay (HOSPITAL_COMMUNITY): Payer: 59

## 2020-08-22 DIAGNOSIS — H1133 Conjunctival hemorrhage, bilateral: Secondary | ICD-10-CM | POA: Diagnosis present

## 2020-08-22 DIAGNOSIS — M879 Osteonecrosis, unspecified: Secondary | ICD-10-CM | POA: Diagnosis present

## 2020-08-22 DIAGNOSIS — K767 Hepatorenal syndrome: Secondary | ICD-10-CM | POA: Diagnosis present

## 2020-08-22 DIAGNOSIS — Z7952 Long term (current) use of systemic steroids: Secondary | ICD-10-CM

## 2020-08-22 DIAGNOSIS — F1029 Alcohol dependence with unspecified alcohol-induced disorder: Secondary | ICD-10-CM

## 2020-08-22 DIAGNOSIS — F419 Anxiety disorder, unspecified: Secondary | ICD-10-CM | POA: Diagnosis present

## 2020-08-22 DIAGNOSIS — R040 Epistaxis: Secondary | ICD-10-CM | POA: Diagnosis present

## 2020-08-22 DIAGNOSIS — K7010 Alcoholic hepatitis without ascites: Secondary | ICD-10-CM | POA: Diagnosis present

## 2020-08-22 DIAGNOSIS — J9601 Acute respiratory failure with hypoxia: Secondary | ICD-10-CM | POA: Diagnosis not present

## 2020-08-22 DIAGNOSIS — E871 Hypo-osmolality and hyponatremia: Secondary | ICD-10-CM | POA: Diagnosis present

## 2020-08-22 DIAGNOSIS — E872 Acidosis: Secondary | ICD-10-CM | POA: Diagnosis not present

## 2020-08-22 DIAGNOSIS — I959 Hypotension, unspecified: Secondary | ICD-10-CM | POA: Diagnosis present

## 2020-08-22 DIAGNOSIS — F1020 Alcohol dependence, uncomplicated: Secondary | ICD-10-CM | POA: Diagnosis present

## 2020-08-22 DIAGNOSIS — I10 Essential (primary) hypertension: Secondary | ICD-10-CM | POA: Diagnosis present

## 2020-08-22 DIAGNOSIS — F10230 Alcohol dependence with withdrawal, uncomplicated: Secondary | ICD-10-CM | POA: Diagnosis present

## 2020-08-22 DIAGNOSIS — E8809 Other disorders of plasma-protein metabolism, not elsewhere classified: Secondary | ICD-10-CM | POA: Diagnosis present

## 2020-08-22 DIAGNOSIS — F17290 Nicotine dependence, other tobacco product, uncomplicated: Secondary | ICD-10-CM | POA: Diagnosis present

## 2020-08-22 DIAGNOSIS — D62 Acute posthemorrhagic anemia: Secondary | ICD-10-CM | POA: Diagnosis present

## 2020-08-22 DIAGNOSIS — Z79899 Other long term (current) drug therapy: Secondary | ICD-10-CM

## 2020-08-22 DIAGNOSIS — D6959 Other secondary thrombocytopenia: Secondary | ICD-10-CM | POA: Diagnosis present

## 2020-08-22 DIAGNOSIS — N179 Acute kidney failure, unspecified: Secondary | ICD-10-CM | POA: Diagnosis present

## 2020-08-22 DIAGNOSIS — D696 Thrombocytopenia, unspecified: Secondary | ICD-10-CM | POA: Diagnosis not present

## 2020-08-22 DIAGNOSIS — R0902 Hypoxemia: Secondary | ICD-10-CM

## 2020-08-22 DIAGNOSIS — U071 COVID-19: Secondary | ICD-10-CM | POA: Diagnosis present

## 2020-08-22 DIAGNOSIS — K760 Fatty (change of) liver, not elsewhere classified: Secondary | ICD-10-CM | POA: Diagnosis present

## 2020-08-22 DIAGNOSIS — R748 Abnormal levels of other serum enzymes: Secondary | ICD-10-CM

## 2020-08-22 DIAGNOSIS — Y900 Blood alcohol level of less than 20 mg/100 ml: Secondary | ICD-10-CM | POA: Diagnosis present

## 2020-08-22 DIAGNOSIS — G4730 Sleep apnea, unspecified: Secondary | ICD-10-CM | POA: Diagnosis present

## 2020-08-22 DIAGNOSIS — Z91018 Allergy to other foods: Secondary | ICD-10-CM

## 2020-08-22 DIAGNOSIS — R0600 Dyspnea, unspecified: Secondary | ICD-10-CM | POA: Diagnosis not present

## 2020-08-22 LAB — CBC WITH DIFFERENTIAL/PLATELET
Abs Immature Granulocytes: 0.96 10*3/uL — ABNORMAL HIGH (ref 0.00–0.07)
Basophils Absolute: 0.2 10*3/uL — ABNORMAL HIGH (ref 0.0–0.1)
Basophils Relative: 1 %
Eosinophils Absolute: 0 10*3/uL (ref 0.0–0.5)
Eosinophils Relative: 0 %
HCT: 31.3 % — ABNORMAL LOW (ref 39.0–52.0)
Hemoglobin: 11.4 g/dL — ABNORMAL LOW (ref 13.0–17.0)
Immature Granulocytes: 4 %
Lymphocytes Relative: 4 %
Lymphs Abs: 0.9 10*3/uL (ref 0.7–4.0)
MCH: 35.8 pg — ABNORMAL HIGH (ref 26.0–34.0)
MCHC: 36.4 g/dL — ABNORMAL HIGH (ref 30.0–36.0)
MCV: 98.4 fL (ref 80.0–100.0)
Monocytes Absolute: 0.6 10*3/uL (ref 0.1–1.0)
Monocytes Relative: 3 %
Neutro Abs: 21.9 10*3/uL — ABNORMAL HIGH (ref 1.7–7.7)
Neutrophils Relative %: 88 %
Platelets: 30 10*3/uL — ABNORMAL LOW (ref 150–400)
RBC: 3.18 MIL/uL — ABNORMAL LOW (ref 4.22–5.81)
RDW: 13.9 % (ref 11.5–15.5)
WBC: 24.6 10*3/uL — ABNORMAL HIGH (ref 4.0–10.5)
nRBC: 0.2 % (ref 0.0–0.2)

## 2020-08-22 LAB — HEPATITIS PANEL, ACUTE
HCV Ab: NONREACTIVE
Hep A IgM: NONREACTIVE
Hep B C IgM: NONREACTIVE
Hepatitis B Surface Ag: NONREACTIVE

## 2020-08-22 LAB — COMPREHENSIVE METABOLIC PANEL
ALT: 76 U/L — ABNORMAL HIGH (ref 0–44)
AST: 271 U/L — ABNORMAL HIGH (ref 15–41)
Albumin: 2.3 g/dL — ABNORMAL LOW (ref 3.5–5.0)
Alkaline Phosphatase: 212 U/L — ABNORMAL HIGH (ref 38–126)
Anion gap: 16 — ABNORMAL HIGH (ref 5–15)
BUN: 120 mg/dL — ABNORMAL HIGH (ref 6–20)
CO2: 20 mmol/L — ABNORMAL LOW (ref 22–32)
Calcium: 8.2 mg/dL — ABNORMAL LOW (ref 8.9–10.3)
Chloride: 88 mmol/L — ABNORMAL LOW (ref 98–111)
Creatinine, Ser: 3.44 mg/dL — ABNORMAL HIGH (ref 0.61–1.24)
GFR, Estimated: 23 mL/min — ABNORMAL LOW (ref >60)
Glucose, Bld: 148 mg/dL — ABNORMAL HIGH (ref 70–99)
Potassium: 4.4 mmol/L (ref 3.5–5.1)
Sodium: 124 mmol/L — ABNORMAL LOW (ref 135–145)
Total Bilirubin: 9.6 mg/dL — ABNORMAL HIGH (ref 0.3–1.2)
Total Protein: 7.3 g/dL (ref 6.5–8.1)

## 2020-08-22 LAB — AMMONIA: Ammonia: 44 umol/L — ABNORMAL HIGH (ref 9–35)

## 2020-08-22 LAB — SARS CORONAVIRUS 2 BY RT PCR (HOSPITAL ORDER, PERFORMED IN ~~LOC~~ HOSPITAL LAB): SARS Coronavirus 2: POSITIVE — AB

## 2020-08-22 LAB — D-DIMER, QUANTITATIVE: D-Dimer, Quant: 2.86 ug/mL-FEU — ABNORMAL HIGH (ref 0.00–0.50)

## 2020-08-22 LAB — LACTATE DEHYDROGENASE: LDH: 240 U/L — ABNORMAL HIGH (ref 98–192)

## 2020-08-22 LAB — ETHANOL: Alcohol, Ethyl (B): <10 mg/dL (ref <10)

## 2020-08-22 LAB — PROTIME-INR
INR: 1.4 — ABNORMAL HIGH (ref 0.8–1.2)
Prothrombin Time: 16.4 seconds — ABNORMAL HIGH (ref 11.4–15.2)

## 2020-08-22 LAB — APTT: aPTT: 42 seconds — ABNORMAL HIGH (ref 24–36)

## 2020-08-22 LAB — C-REACTIVE PROTEIN: CRP: 29.4 mg/dL — ABNORMAL HIGH (ref <1.0)

## 2020-08-22 MED ORDER — ALBUMIN HUMAN 25 % IV SOLN
25.0000 g | Freq: Two times a day (BID) | INTRAVENOUS | Status: DC
  Administered 2020-08-22 – 2020-08-27 (×10): 25 g via INTRAVENOUS
  Filled 2020-08-22 (×13): qty 100

## 2020-08-22 MED ORDER — FOLIC ACID 1 MG PO TABS
1.0000 mg | ORAL_TABLET | Freq: Every day | ORAL | Status: DC
  Administered 2020-08-22 – 2020-08-29 (×7): 1 mg via ORAL
  Filled 2020-08-22 (×8): qty 1

## 2020-08-22 MED ORDER — LORAZEPAM 1 MG PO TABS
1.0000 mg | ORAL_TABLET | ORAL | Status: AC | PRN
  Administered 2020-08-24: 1 mg via ORAL
  Administered 2020-08-25: 2 mg via ORAL
  Filled 2020-08-22: qty 2

## 2020-08-22 MED ORDER — LORAZEPAM 2 MG/ML IJ SOLN
1.0000 mg | INTRAMUSCULAR | Status: AC | PRN
  Administered 2020-08-23 – 2020-08-25 (×2): 2 mg via INTRAVENOUS
  Administered 2020-08-25: 3 mg via INTRAVENOUS
  Filled 2020-08-22: qty 1
  Filled 2020-08-22: qty 2
  Filled 2020-08-22 (×2): qty 1

## 2020-08-22 MED ORDER — THIAMINE HCL 100 MG PO TABS
100.0000 mg | ORAL_TABLET | Freq: Every day | ORAL | Status: DC
  Administered 2020-08-23 – 2020-08-29 (×6): 100 mg via ORAL
  Filled 2020-08-22 (×7): qty 1

## 2020-08-22 MED ORDER — THIAMINE HCL 100 MG/ML IJ SOLN
100.0000 mg | Freq: Every day | INTRAMUSCULAR | Status: DC
  Administered 2020-08-22 – 2020-08-24 (×2): 100 mg via INTRAVENOUS
  Filled 2020-08-22 (×3): qty 2

## 2020-08-22 MED ORDER — LORAZEPAM 1 MG PO TABS
0.0000 mg | ORAL_TABLET | Freq: Four times a day (QID) | ORAL | Status: DC
  Administered 2020-08-23 (×2): 2 mg via ORAL
  Administered 2020-08-24 (×3): 1 mg via ORAL
  Filled 2020-08-22 (×2): qty 2
  Filled 2020-08-22 (×3): qty 1

## 2020-08-22 MED ORDER — SODIUM CHLORIDE 0.9 % IV BOLUS
1000.0000 mL | Freq: Once | INTRAVENOUS | Status: AC
  Administered 2020-08-22: 1000 mL via INTRAVENOUS

## 2020-08-22 MED ORDER — ADULT MULTIVITAMIN W/MINERALS CH
1.0000 | ORAL_TABLET | Freq: Every day | ORAL | Status: DC
  Administered 2020-08-22 – 2020-08-29 (×7): 1 via ORAL
  Filled 2020-08-22 (×8): qty 1

## 2020-08-22 MED ORDER — LORAZEPAM 1 MG PO TABS
0.0000 mg | ORAL_TABLET | Freq: Two times a day (BID) | ORAL | Status: DC
  Filled 2020-08-22: qty 1
  Filled 2020-08-22: qty 2

## 2020-08-22 NOTE — ED Provider Notes (Signed)
[facility]-EMERGENCY DEPT Provider Note   CSN: [id number] Arrival date & time: 08/22/20  1423     History Chief Complaint  Patient presents with  . Medication Reaction    [name] is a 35 y.o. male.  HPI   Patient with significant medical history of hypertension, alcohol dependency presents with chief complaint of medication reaction.  Patient endorses he has started taking muscle relaxer and tramadol for his left hip pain and developed burst blood vessels in both of his eyes, increased nosebleeds and is turning yellow.  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  He has been cutting back on his alcohol consumption, states he used to drink 10-12 beers daily for the last 10 years but is cutting down to only 2 beers a day.  He endorses that he has had 2 severe nosebleeds over the last 2 days, denies recent trauma to the area, he is also noticed burst blood vessel in the eyes again denies any trauma to the area or decrease in visual acuity.  He does endorse he has had some intermittent photophobia.  He has had one episode of vomiting and he noticed some blood within the vomit, he denies seeing blood in his stool or within his urine.  He denies abnormal bruising or abnormal bleeding time.  He denies chest pain, shortness of breath but has felt more fatigued lately.  He has no abdominal pain, tolerating p.o. without difficulty.  He has no history of bleeding disorders, does not take anticoagulants.  Denies  alleviating factors.  Patient denies headaches, fevers, chills, shortness of breath, chest pain, abdominal pain, nausea, vomiting, diarrhea, pedal edema. Past Medical History:  Diagnosis Date  . Alcohol abuse   . Asthma   . Depression   . Hypertension     Patient Active Problem List   Diagnosis Date Noted  . ARF (acute renal failure) (HCC) 08/22/2020  . Alcohol abuse   . MDD (major depressive disorder) 10/15/2013   . Alcohol dependence (HCC) 04/02/2013  . Social anxiety disorder 04/02/2013  . ALLERGIC RHINITIS 03/28/2007    Past Surgical History:  Procedure Laterality Date  . FRACTURE SURGERY         Family History  Problem Relation Age of Onset  . Cirrhosis Neg Hx   . Diabetes Mellitus II Neg Hx     Social History   Tobacco Use  . Smoking status: Current Some Day Smoker    Types: Cigars  . Smokeless tobacco: Never Used  Substance Use Topics  . Alcohol use: Yes    Alcohol/week: 12.0 - 18.0 standard drinks    Types: 12 - 18 Cans of beer per week    Comment: heavy drinker  . Drug use: Yes    Types: Marijuana    Home Medications Prior to Admission medications   Medication Sig Start Date End Date Taking? Authorizing Provider  acetaminophen (TYLENOL) 500 MG tablet Take 500 mg by mouth every 6 (six) hours as needed for moderate pain.    [provider]  amoxicillin-clavulanate (AUGMENTIN) 875-125 MG per tablet Take 1 tablet by mouth every 12 (twelve) hours. Patient not taking: Reported on 05/29/2015 01/21/15   Patel-Mills, Lorelle Formosa, PA-C  buPROPion (WELLBUTRIN XL) 300 MG 24 hr tablet Take 1 tablet (300 mg total) by mouth daily. Patient not taking: Reported on 10/12/2014 10/16/13   Rachael Fee, MD  carbamazepine (TEGRETOL XR) 200 MG 12 hr tablet Take 1  tablet (200 mg total) by mouth at bedtime. Patient not taking: Reported on 10/12/2014 10/16/13   Rachael Fee, MD  hydrochlorothiazide (HYDRODIURIL) 12.5 MG tablet Take 1 tablet (12.5 mg total) by mouth daily. Patient not taking: Reported on 02/18/2020 02/24/19   Hedges, Tinnie Gens, PA-C  hydrOXYzine (ATARAX/VISTARIL) 25 MG tablet Take 1 tablet (25 mg total) by mouth every 6 (six) hours as needed for anxiety (or CIWA score </= 10). Patient not taking: Reported on 10/12/2014 10/16/13   Rachael Fee, MD  ibuprofen (ADVIL) 200 MG tablet Take 200 mg by mouth every 6 (six) hours as needed for moderate pain.    [provider]   Multiple Vitamin (MULTIVITAMIN WITH MINERALS) TABS tablet Take 1 tablet by mouth daily. 10/16/13   Rachael Fee, MD  predniSONE (DELTASONE) 10 MG tablet Take 2 tablets (20 mg total) by mouth 2 (two) times daily with a meal. 02/18/20   Geoffery Lyons, MD  traMADol (ULTRAM) 50 MG tablet Take 1 tablet (50 mg total) by mouth every 6 (six) hours as needed. 02/18/20   Geoffery Lyons, MD  traZODone (DESYREL) 50 MG tablet Take 1 tablet (50 mg total) by mouth at bedtime as needed and may repeat dose one time if needed for sleep. For sleep Patient not taking: Reported on 10/12/2014 10/16/13   Rachael Fee, MD    Allergies    Cashew nut oil  Review of Systems   Review of Systems  Constitutional: Negative for chills and fever.  HENT: Positive for nosebleeds. Negative for congestion.   Eyes: Positive for photophobia and redness. Negative for pain and visual disturbance.  Respiratory: Negative for shortness of breath.   Cardiovascular: Negative for chest pain.  Gastrointestinal: Positive for vomiting. Negative for abdominal pain, blood in stool, constipation, diarrhea and nausea.  Genitourinary: Negative for enuresis and hematuria.  Musculoskeletal: Negative for back pain.  Skin: Negative for rash and wound.  Neurological: Negative for dizziness and headaches.  Hematological: Bruises/bleeds easily.    Physical Exam Updated Vital Signs BP 110/86   Pulse 90   Temp 97.9 F (36.6 C) (Oral)   Resp 19   SpO2 98%   Physical Exam Vitals and nursing note reviewed.  Constitutional:      General: He is not in acute distress.    Appearance: He is not ill-appearing.  HENT:     Head: Normocephalic and atraumatic.     Nose: No congestion or rhinorrhea.     Comments: Patient has noted blood crusted within his nasal passages.    Mouth/Throat:     Mouth: Mucous membranes are dry.     Pharynx: Oropharynx is clear. No oropharyngeal exudate or posterior oropharyngeal erythema.     Comments: Around  patient's oral cavity and there was noted dry blood on his lips, tongue and uvula are both midline, controlling his own secretions no difficulty. Eyes:     Extraocular Movements: Extraocular movements intact.     Conjunctiva/sclera: Conjunctivae normal.     Pupils: Pupils are equal, round, and reactive to light.     Comments: Patient has noted subconjunctival hemorrhage on the medial aspect of his sclerae.  No noted blood within the anterior chamber of the eye.  No decrease in visual acuity present.  Sclerae  have a yellow tint to them.  Cardiovascular:     Rate and Rhythm: Normal rate and regular rhythm.     Pulses: Normal pulses.     Heart sounds: No murmur heard. No friction  rub. No gallop.   Pulmonary:     Effort: No respiratory distress.     Breath sounds: No wheezing, rhonchi or rales.  Abdominal:     Palpations: Abdomen is soft.     Tenderness: There is no abdominal tenderness.  Musculoskeletal:     Comments: Patient is moving all 4 extremities out difficulty.  Skin:    General: Skin is warm and dry.     Comments: FatigueLimited skin exam was performed, there is no noted here, ecchymosis, lacerations or abrasions, track marks noted on patient's upper or lower extremities, abdomen or back.  Neurological:     Mental Status: He is alert.  Psychiatric:        Mood and Affect: Mood normal.     ED Results / Procedures / Treatments   Labs (all labs ordered are listed, but only abnormal results are displayed) Labs Reviewed  COMPREHENSIVE METABOLIC PANEL - Abnormal; Notable for the following components:      Result Value   Sodium 124 (*)    Chloride 88 (*)    CO2 20 (*)    Glucose, Bld 148 (*)    BUN 120 (*)    Creatinine, Ser 3.44 (*)    Calcium 8.2 (*)    Albumin 2.3 (*)    AST 271 (*)    ALT 76 (*)    Alkaline Phosphatase 212 (*)    Total Bilirubin 9.6 (*)    GFR, Estimated 23 (*)    Anion gap 16 (*)    All other components within normal limits  PROTIME-INR -  Abnormal; Notable for the following components:   Prothrombin Time 16.4 (*)    INR 1.4 (*)    All other components within normal limits  AMMONIA - Abnormal; Notable for the following components:   Ammonia 44 (*)    All other components within normal limits  CBC WITH DIFFERENTIAL/PLATELET - Abnormal; Notable for the following components:   WBC 24.6 (*)    RBC 3.18 (*)    Hemoglobin 11.4 (*)    HCT 31.3 (*)    MCH 35.8 (*)    MCHC 36.4 (*)    Platelets 30 (*)    Neutro Abs 21.9 (*)    Basophils Absolute 0.2 (*)    Abs Immature Granulocytes 0.96 (*)    All other components within normal limits  APTT - Abnormal; Notable for the following components:   aPTT 42 (*)    All other components within normal limits  SARS CORONAVIRUS 2 BY RT PCR (HOSPITAL ORDER, PERFORMED IN [facility])  ETHANOL  URINALYSIS, ROUTINE W REFLEX MICROSCOPIC  HEPATITIS PANEL, ACUTE  SODIUM, URINE, RANDOM  CREATININE, URINE, RANDOM  LACTATE DEHYDROGENASE  PATHOLOGIST SMEAR REVIEW  HAPTOGLOBIN  HEPATITIS PANEL, ACUTE  TYPE AND SCREEN    EKG EKG Interpretation  Date/Time:  Monday August 22 2020 18:08:04 EST Ventricular Rate:  88 PR Interval:    QRS Duration: 101 QT Interval:  369 QTC Calculation: 447 R Axis:   95 Text Interpretation: Sinus rhythm Borderline right axis deviation Low voltage, precordial leads 12 Lead; Mason-Likar Since last tracing rate slower Otherwise no significant change Confirmed by Mancel BaleWentz, Elliott [phone]([id number]) on 08/22/2020 8:01:04 PM   Radiology DG Chest 2 View  Result Date: 08/22/2020 CLINICAL DATA:  Elevated white blood cell count EXAM: CHEST - 2 VIEW COMPARISON:  None. FINDINGS: Cardiac shadow is mildly enlarged but accentuated by the frontal technique. The lungs are well aerated bilaterally. No focal  infiltrate or effusion is seen. No bony abnormality is noted. IMPRESSION: No acute abnormality noted. Electronically Signed   By: Alcide Clever M.D.   On: 08/22/2020 19:35    US Abdomen Limited  Result Date: 08/22/2020 CLINICAL DATA:  Jaundice.  Concern for liver cirrhosis. EXAM: ULTRASOUND ABDOMEN LIMITED RIGHT UPPER QUADRANT COMPARISON:  None. FINDINGS: Gallbladder: Physiologically distended. No gallstones or wall thickening visualized. No sonographic Murphy sign noted by sonographer. Common bile duct: Diameter: 3 mm, not well-defined. Liver: Diffusely increased parenchymal echogenicity, liver parenchyma is difficult to penetrate. No discrete focal hepatic lesion. No definite capsular nodularity. Portal vein is patent on color Doppler imaging with normal direction of blood flow towards the liver. Other: No right upper quadrant ascites. There are bilateral pleural effusions. Technically challenging exam due to habitus and patient's limited ability to hold breath. IMPRESSION: 1. Diffusely increased hepatic parenchymal echogenicity consistent with steatosis or other intrinsic hepatocellular disease. No capsular nodularity or other findings of cirrhosis. 2. Normal sonographic appearance of the gallbladder and biliary tree. 3. Pleural effusions are noted. There is no right upper quadrant ascites. Electronically Signed   By: Narda Rutherford M.D.   On: 08/22/2020 18:54    Procedures Procedures   Medications Ordered in ED Medications  sodium chloride 0.9 % bolus 1,000 mL (0 mLs Intravenous Stopped 08/22/20 2036)  sodium chloride 0.9 % bolus 1,000 mL (1,000 mLs Intravenous New Bag/Given 08/22/20 2035)    ED Course  I have reviewed the triage vital signs and the nursing notes.  Pertinent labs & imaging results that were available during my care of the patient were reviewed by me and considered in my medical decision making (see chart for details).    MDM Rules/Calculators/A&P                          Initial impression-patient presents with chief complaint of medication reaction.  He is alert, does not appear in acute distress, vital signs show hypertension with a  systolic of 93.  Concern for cirrhosis,  Coagulopathy, cytopenia.  Will obtain basic lab work, add on ammonia, ethanol, coag studies.  Obtain imaging of liver for further evaluation.  Work-up-CBC shows leukocytosis 24.6, normocytic anemia appears to be a baseline for patient, platelets 30, CMP shows hyponatremia of 124, decreased CO2 of 20, elevated glucose of 148, elevated BUN of 120, creatinine 3.44, elevation AST 271, ALT 76, t billi.9.6, anion gap 16.  Ammonia level 44, APTT 42, prothrombin time 16.4, INR 1.4.  Chest x-ray negative for acute findings.  Limited abdominal ultrasound shows diffusely increased hepatic parenchyma consistent with cysts still low cysts or intrinsic hepatocellular disease EKG sinus rhythm without signs of ischemia no ST elevation depression noted.  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  Spoke with Dr. Toniann Fail of the hospitalist team, he has accept the patient and will come down evaluate him.  Reassessment patient was reassessed, he was provided with fluids, vital signs have remained stable, blood pressure has improved, patient has no complaints at this time.  Rule out-I have low suspicion for systemic infection as patient is nontoxic-appearing, vital signs reassuring, no obvious source infection on my exam.  Patient does note to have acute  leukocytosis but I suspect this may be secondary due to acute phase reactant possible bleed.  Will obtain chest x-ray and urine for further evaluation.  Low suspicion for emergent dialysis as  there is no severe electrolyte derailment, EKG changes, no respiratory distress, hemodynamically stable.  Low suspicion for acute abdominal abnormality as abdomen soft nontender to palpation, no peritoneal sign, patient tolerated p.o. without difficulty.  Low suspicion for gallbladder abnormality as right upper quadrant is nontender to palpation, limited  ultrasound does not reveal any stones or gallbladder morphology.  Patient is noted to have AKI, I doubt UTI or pyelonephritis as patient denies urinary symptoms, he does endorse that he has not been drinking very little and feels like he is dehydrated.  I suspect AKI secondary due to dehydration and he had dry mucous on my exam.  Plan-I suspect patient suffering from cirrhosis from alcohol use, this would explain his coagulopathy, increased liver enzymes, and findings on limited ultrasound.  Anticipate patient will need hospital admission for further evaluation.  Patient care be transferred over to admitting team.     Final Clinical Impression(s) / ED Diagnoses Final diagnoses:  AKI (acute kidney injury) (HCC)  Elevated liver enzymes    Rx / DC Orders ED Discharge Orders    None       Carroll Sage, PA-C 08/22/20 2059    Mancel Bale, MD 08/22/20 2221

## 2020-08-22 NOTE — H&P (Addendum)
History and Physical    FABIAN WALDER ZOX:[id number] DOB: July 10, 1986 DOA: 08/22/2020  PCP: [name], PA  Patient coming from: Home.  Chief Complaint: Epistaxis.  HPI: [name] is a 35 y.o. male with history of hypertension, alcohol abuse depression with oral left hip pain from avascular necrosis being scheduled for possible surgery was recently placed on Robaxin and tramadol for worsening pain and had taken first dose yesterday started developing epistaxis and throwing up some blood over the last 24 hours.  Denies any fever chills.  Has been noticing some lower extremity edema for many weeks now.  Admits to drinking alcohol.  Patient has been on Voltaren for many weeks now for the left hip pain also takes lisinopril and hydrochlorothiazide for blood pressure and was also recently placed on carvedilol for tachycardia and hypertension by patient's cardiologist.  Denies any shortness of breath productive cough fever or chills.  ED Course: In the ER patient patient blood pressure initially was in the low 90s was given 2 L normal saline bolus.  On exam patient has mild edema of the both lower extremities.  Chest x-ray unremarkable labs are significant for creatinine of 3.4 and on August 19, 2020 results in care everywhere shows creatinine of 0.9 with AST of 153 ALT 45 and sodium of 134 with platelets of 128 and hemoglobin of 14.2.  At this time patient's hemoglobin 11.4 platelets of 30 WBC count of 24.6.  LFTs are also elevated with AST of 271 ALT of 36 total bilirubin of 9.6.  Sonogram of the right upper quadrant shows features concerning for hepatic steatosis.  No definite evidence of cirrhosis.  Abdomen is mildly distended.  Covid test came back positive.  Patient is nonhypoxic.  Patient admitted for acute renal failure with thrombocytopenia epistaxis.  Review of Systems: As per HPI, rest all negative.   Past Medical History:  Diagnosis Date  . Alcohol abuse   . Asthma   .  Depression   . Hypertension     Past Surgical History:  Procedure Laterality Date  . FRACTURE SURGERY       reports that he has been smoking cigars. He has never used smokeless tobacco. He reports current alcohol use of about 12.0 - 18.0 standard drinks of alcohol per week. He reports current drug use. Drug: Marijuana.  Allergies  Allergen Reactions  . Cashew Nut (Anacardium Occidentale) Skin Test Nausea And Vomiting    Allergy to cashews only; Tolerates other nuts  . Cashew Nut Oil Nausea And Vomiting    Allergy to cashews only; Tolerates other nuts    Family History  Problem Relation Age of Onset  . Cirrhosis Neg Hx   . Diabetes Mellitus II Neg Hx     Prior to Admission medications   Medication Sig Start Date End Date Taking? Authorizing Provider  aspirin EC 81 MG tablet Take 81 mg by mouth daily. Swallow whole.   Yes [provider]  carvedilol (COREG) 3.125 MG tablet Take 3.125 mg by mouth 2 (two) times daily. 07/28/20  Yes [provider]  diclofenac (VOLTAREN) 75 MG EC tablet Take 75 mg by mouth 2 (two) times daily. 08/20/20  Yes [provider]  ibuprofen (ADVIL) 200 MG tablet Take 200 mg by mouth every 6 (six) hours as needed for moderate pain.   Yes [provider]  lisinopril-hydrochlorothiazide (ZESTORETIC) 20-25 MG tablet Take 1 tablet by mouth daily. 08/08/20  Yes [provider]  magnesium oxide (MAG-OX) 400  MG tablet Take 1 tablet by mouth daily. 07/28/20  Yes [provider]  methocarbamol (ROBAXIN) 750 MG tablet Take 750 mg by mouth 2 (two) times daily. 08/19/20  Yes [provider]  Multiple Vitamin (MULTIVITAMIN WITH MINERALS) TABS tablet Take 1 tablet by mouth daily. 10/16/13  Yes Rachael Fee, MD  rosuvastatin (CRESTOR) 5 MG tablet Take 5 mg by mouth daily. 08/16/20  Yes [provider]  sertraline (ZOLOFT) 25 MG tablet Take 25 mg by mouth daily. 08/20/20  Yes [provider]  traMADol  (ULTRAM) 50 MG tablet Take 1 tablet (50 mg total) by mouth every 6 (six) hours as needed. Patient taking differently: Take 50 mg by mouth 2 (two) times daily. 02/18/20  Yes Delo, Riley Lam, MD  amoxicillin-clavulanate (AUGMENTIN) 875-125 MG per tablet Take 1 tablet by mouth every 12 (twelve) hours. Patient not taking: No sig reported 01/21/15   Patel-Mills, Lorelle Formosa, PA-C  buPROPion (WELLBUTRIN XL) 300 MG 24 hr tablet Take 1 tablet (300 mg total) by mouth daily. Patient not taking: No sig reported 10/16/13   Rachael Fee, MD  carbamazepine (TEGRETOL XR) 200 MG 12 hr tablet Take 1 tablet (200 mg total) by mouth at bedtime. Patient not taking: No sig reported 10/16/13   Rachael Fee, MD  hydrochlorothiazide (HYDRODIURIL) 12.5 MG tablet Take 1 tablet (12.5 mg total) by mouth daily. Patient not taking: No sig reported 02/24/19   Hedges, Tinnie Gens, PA-C  hydrOXYzine (ATARAX/VISTARIL) 25 MG tablet Take 1 tablet (25 mg total) by mouth every 6 (six) hours as needed for anxiety (or CIWA score </= 10). Patient not taking: No sig reported 10/16/13   Rachael Fee, MD  predniSONE (DELTASONE) 10 MG tablet Take 2 tablets (20 mg total) by mouth 2 (two) times daily with a meal. Patient not taking: No sig reported 02/18/20   Geoffery Lyons, MD  traZODone (DESYREL) 50 MG tablet Take 1 tablet (50 mg total) by mouth at bedtime as needed and may repeat dose one time if needed for sleep. For sleep Patient not taking: No sig reported 10/16/13   Rachael Fee, MD    Physical Exam: Constitutional: Moderately built and nourished. Vitals:   08/22/20 1437 08/22/20 1818 08/22/20 1853  BP: (!) 93/47 110/86   Pulse: 97 90   Resp: 18 18 19   Temp: 97.9 F (36.6 C)    TempSrc: Oral    SpO2: 96% 98%    Eyes: Anicteric no pallor. ENMT: No discharge from the ears eyes nose or mouth. Neck: No mass felt.  No neck rigidity. Respiratory: No rhonchi or crepitations. Cardiovascular: S1-S2 heard. Abdomen: Soft nontender bowel sounds  present. Musculoskeletal: Mild edema of the both lower extremities. Skin: No rash. Neurologic: Alert awake oriented to time place and person.  Moves all extremities. Psychiatric: Appears normal.  Normal affect.   Labs on Admission: I have personally reviewed following labs and imaging studies  CBC: Recent Labs  Lab 08/22/20 1817  WBC 24.6*  NEUTROABS 21.9*  HGB 11.4*  HCT 31.3*  MCV 98.4  PLT 30*   Basic Metabolic Panel: Recent Labs  Lab 08/22/20 1817  NA 124*  K 4.4  CL 88*  CO2 20*  GLUCOSE 148*  BUN 120*  CREATININE 3.44*  CALCIUM 8.2*   GFR: CrCl cannot be calculated (Unknown ideal weight.). Liver Function Tests: Recent Labs  Lab 08/22/20 1817  AST 271*  ALT 76*  ALKPHOS 212*  BILITOT 9.6*  PROT 7.3  ALBUMIN 2.3*  No results for input(s): LIPASE, AMYLASE in the last 168 hours. Recent Labs  Lab 08/22/20 1817  AMMONIA 44*   Coagulation Profile: Recent Labs  Lab 08/22/20 1817  INR 1.4*   Cardiac Enzymes: No results for input(s): CKTOTAL, CKMB, CKMBINDEX, TROPONINI in the last 168 hours. BNP (last 3 results) No results for input(s): PROBNP in the last 8760 hours. HbA1C: No results for input(s): HGBA1C in the last 72 hours. CBG: No results for input(s): GLUCAP in the last 168 hours. Lipid Profile: No results for input(s): CHOL, HDL, LDLCALC, TRIG, CHOLHDL, LDLDIRECT in the last 72 hours. Thyroid Function Tests: No results for input(s): TSH, T4TOTAL, FREET4, T3FREE, THYROIDAB in the last 72 hours. Anemia Panel: No results for input(s): VITAMINB12, FOLATE, FERRITIN, TIBC, IRON, RETICCTPCT in the last 72 hours. Urine analysis:    Component Value Date/Time   COLORURINE YELLOW 07/18/2011 0413   APPEARANCEUR CLEAR 07/18/2011 0413   LABSPEC 1.012 07/18/2011 0413   PHURINE 7.0 07/18/2011 0413   GLUCOSEU NEGATIVE 07/18/2011 0413   HGBUR NEGATIVE 07/18/2011 0413   BILIRUBINUR NEGATIVE 07/18/2011 0413   KETONESUR NEGATIVE 07/18/2011 0413    PROTEINUR 30 (A) 07/18/2011 0413   UROBILINOGEN 0.2 07/18/2011 0413   NITRITE NEGATIVE 07/18/2011 0413   LEUKOCYTESUR NEGATIVE 07/18/2011 0413   Sepsis Labs: @LABRCNTIP (procalcitonin:4,lacticidven:4) ) Recent Results (from the past 240 hour(s))  SARS Coronavirus 2 by RT PCR (hospital order, performed in [facility] hospital lab) Nasopharyngeal Nasopharyngeal Swab     Status: Abnormal   Collection Time: 08/22/20  7:18 PM   Specimen: Nasopharyngeal Swab  Result Value Ref Range Status   SARS Coronavirus 2 POSITIVE (A) NEGATIVE Final    Comment: RESULT CALLED TO, READ BACK BY AND VERIFIED WITH: ZULETA K. 1.31.22 @ 2100 BY MECIAL J. (NOTE) SARS-CoV-2 target nucleic acids are DETECTED  SARS-CoV-2 RNA is generally detectable in upper respiratory specimens  during the acute phase of infection.  Positive results are indicative  of the presence of the identified virus, but do not rule out bacterial infection or co-infection with other pathogens not detected by the test.  Clinical correlation with patient history and  other diagnostic information is necessary to determine patient infection status.  The expected result is negative.  Fact Sheet for Patients:   02-06-2000   Fact Sheet for Healthcare Providers:   BoilerBrush.com.cy    This test is not yet approved or cleared by the https://pope.com/ FDA and  has been authorized for detection and/or diagnosis of SARS-CoV-2 by FDA under an Emergency Use Authorization (EUA).  This EUA will remain in effect (meaning this  test can be used) for the duration of  the COVID-19 declaration under Section 564(b)(1) of the Act, 21 U.S.C. section 360-bbb-3(b)(1), unless the authorization is terminated or revoked sooner.  Performed at [facility], [address], [place], [place]      Radiological Exams on Admission: DG Chest 2 View  Result Date: 08/22/2020 CLINICAL  DATA:  Elevated white blood cell count EXAM: CHEST - 2 VIEW COMPARISON:  None. FINDINGS: Cardiac shadow is mildly enlarged but accentuated by the frontal technique. The lungs are well aerated bilaterally. No focal infiltrate or effusion is seen. No bony abnormality is noted. IMPRESSION: No acute abnormality noted. Electronically Signed   By: 08/24/2020 M.D.   On: 08/22/2020 19:35   08/24/2020 Abdomen Limited  Result Date: 08/22/2020 CLINICAL DATA:  Jaundice.  Concern for liver cirrhosis. EXAM: ULTRASOUND ABDOMEN LIMITED RIGHT UPPER QUADRANT COMPARISON:  None. FINDINGS: Gallbladder: Physiologically distended. No gallstones or wall thickening visualized. No sonographic Murphy sign noted by sonographer. Common bile duct: Diameter: 3 mm, not well-defined. Liver: Diffusely increased parenchymal echogenicity, liver parenchyma is difficult to penetrate. No discrete focal hepatic lesion. No definite capsular nodularity. Portal vein is patent on color Doppler imaging with normal direction of blood flow towards the liver. Other: No right upper quadrant ascites. There are bilateral pleural effusions. Technically challenging exam due to habitus and patient's limited ability to hold breath. IMPRESSION: 1. Diffusely increased hepatic parenchymal echogenicity consistent with steatosis or other intrinsic hepatocellular disease. No capsular nodularity or other findings of cirrhosis. 2. Normal sonographic appearance of the gallbladder and biliary tree. 3. Pleural effusions are noted. There is no right upper quadrant ascites. Electronically Signed   By: Narda Rutherford M.D.   On: 08/22/2020 18:54    EKG: Independently reviewed.  Normal sinus rhythm.  Assessment/Plan Principal Problem:   ARF (acute renal failure) (HCC) Active Problems:   Alcohol dependence (HCC)   Thrombocytopenia (HCC)   Alcoholic hepatitis   Hypertension   COVID-19 virus infection    1. Acute renal failure -I discussed with on-call nephrologist Dr.  Thedore Mins.  Because of the renal failure could be multifactorial including patient being on Voltaren and lisinopril hydrochlorothiazide in the setting of hypertension not sure if patient is also having hepatorenal syndrome.  Nephrologist advised albumin and gently hydrating following metabolic panel every 8 hours intake output and renal ultrasound.  UA still pending.  Will follow nephrology consult. 2. Severe thrombocytopenia could be from alcoholism.  Given that patient also has renal failure will check smear review and also LDH and haptoglobin to make sure there is no hemolytic process.  Medication reaction not sure.  Will follow CBC closely. 3. COVID-19 infection presently not hypoxic.  Chest x-ray not show any infiltrates.  Patient consented for remdesivir.  Follow LFTs closely.  Will discuss with pharmacy.  Or inflammatory markers. 4. Alcoholic hepatitis -discriminant function score is around 25.2.  Check acute hepatitis panel.  Follow LFTs closely.  Consult gastroenterologist. 5. Hyponatremia follow metabolic panel closely.  Did receive saline bolus in the ER.  Check urine studies.  Nephrology consulted. 6. Anemia with one episode of hematemesis.  Not sure if patient was throwing up swallowed blood.  Will consult GI.  Check serial CBC.  We will keep patient on Protonix for now. 7. Leukocytosis could be reactionary.  We will follow closely.  Given that patient has alcoholic related liver disease we will keep patient on ceftriaxone for now. 8. History of hypertension was having low normal blood pressure on admission will hold off antihypertensives for now.  As needed IV labetalol for systolic more than 160.  Follow blood pressure trends.  Lisinopril and hydrochlorothiazide are held in addition because of renal failure. 9. Left hip pain secondary to avascular necrosis per the patient.  Since patient has acute renal failure with severe thrombocytopenia worsening anemia hyponatremia Covid infection will need  inpatient status.   DVT prophylaxis: SCDs.  Avoiding anticoagulation due to severe thrombocytopenia. Code Status: Full code. Family Communication: Discussed with patient. Disposition Plan: Home. Consults called: Nephrology. Admission status: Inpatient.   Eduard Clos MD [facility] Pager [phone].  If 7PM-7AM, please contact night-coverage [email or web address] Password [facility]  08/22/2020, 9:36 PM

## 2020-08-22 NOTE — Progress Notes (Addendum)
Discussed case with admitting physician. 35 year old male with a PMH of HTN, EtOH dependency, anxiety, avascular necrosis of hip presents with epistaxis initially. Was also found to be COVID positive here which improved. Found to have a Na of 124, Cr of 3.44, BUN 120, CO2 20, AST 271, and ALT 76.  Upon reviewing his chart, he is on lisinopril-HCTZ, advil, and voltaren tablets which I believe are contributing factors to his AKI. It appears as if that he does not have any radiographic evidence of cirrhosis. Apparently, also has some edema in his bilateral LE's. He did receive 2L of NS in the ER. Assessment: 1. AKI, multifactorial as above. There is concern for possible HRS. Baseline Cr around 0.7-0.9. 2. Hyponatremia, likely related to AKI 3. Metabolic acidosis 4. Hypoalbuminemia Plan: -agree with holding his ACEI and HCTZ -hold on octreotide and midodrine for now, agree with volume resuscitation provided he is okay from a respiratory standpoint. Can start with albumin 25 BID -Serial labs Q8H for now to monitor Na -UA w/ microscopy ordered, urine electrolytes pending -dedicated renal u/s ordered -full consult note to follow  Anthony Sar, MD [facility]

## 2020-08-22 NOTE — ED Triage Notes (Signed)
Pt presents with c/o medication reaction. Pt reports he is supposed to have a hip replacement in March and started a new medication, Tramadol. Pt reports that he started taking this med 2 days ago and since then has had nosebleeds, what appears to be popped blood vessels in his eyes, and he reports he vomited the medicine yesterday. Pt's last dose of this med was Sunday night. Pt in no acute distress at this time.

## 2020-08-22 NOTE — ED Notes (Signed)
Patient transported to X-ray 

## 2020-08-22 NOTE — ED Notes (Signed)
Patient provided with urinal to obtain urine specimen

## 2020-08-23 DIAGNOSIS — K7010 Alcoholic hepatitis without ascites: Secondary | ICD-10-CM

## 2020-08-23 DIAGNOSIS — D696 Thrombocytopenia, unspecified: Secondary | ICD-10-CM

## 2020-08-23 DIAGNOSIS — I10 Essential (primary) hypertension: Secondary | ICD-10-CM

## 2020-08-23 LAB — HEPATIC FUNCTION PANEL
ALT: 59 U/L — ABNORMAL HIGH (ref 0–44)
AST: 195 U/L — ABNORMAL HIGH (ref 15–41)
Albumin: 2.3 g/dL — ABNORMAL LOW (ref 3.5–5.0)
Alkaline Phosphatase: 194 U/L — ABNORMAL HIGH (ref 38–126)
Bilirubin, Direct: 6.2 mg/dL — ABNORMAL HIGH (ref 0.0–0.2)
Indirect Bilirubin: 2.7 mg/dL — ABNORMAL HIGH (ref 0.3–0.9)
Total Bilirubin: 8.9 mg/dL — ABNORMAL HIGH (ref 0.3–1.2)
Total Protein: 6.4 g/dL — ABNORMAL LOW (ref 6.5–8.1)

## 2020-08-23 LAB — RAPID URINE DRUG SCREEN, HOSP PERFORMED
Amphetamines: NOT DETECTED
Barbiturates: NOT DETECTED
Benzodiazepines: NOT DETECTED
Cocaine: NOT DETECTED
Opiates: NOT DETECTED
Tetrahydrocannabinol: POSITIVE — AB

## 2020-08-23 LAB — CBC WITH DIFFERENTIAL/PLATELET
Abs Immature Granulocytes: 0.7 10*3/uL — ABNORMAL HIGH (ref 0.00–0.07)
Abs Immature Granulocytes: 0.38 10*3/uL — ABNORMAL HIGH (ref 0.00–0.07)
Abs Immature Granulocytes: 1.02 10*3/uL — ABNORMAL HIGH (ref 0.00–0.07)
Basophils Absolute: 0.1 10*3/uL (ref 0.0–0.1)
Basophils Absolute: 0.1 10*3/uL (ref 0.0–0.1)
Basophils Absolute: 0.1 10*3/uL (ref 0.0–0.1)
Basophils Relative: 0 %
Basophils Relative: 0 %
Basophils Relative: 0 %
Eosinophils Absolute: 0.1 10*3/uL (ref 0.0–0.5)
Eosinophils Absolute: 0 10*3/uL (ref 0.0–0.5)
Eosinophils Absolute: 0 10*3/uL (ref 0.0–0.5)
Eosinophils Relative: 0 %
Eosinophils Relative: 0 %
Eosinophils Relative: 0 %
HCT: 28 % — ABNORMAL LOW (ref 39.0–52.0)
HCT: 29.5 % — ABNORMAL LOW (ref 39.0–52.0)
HCT: 29.4 % — ABNORMAL LOW (ref 39.0–52.0)
Hemoglobin: 10.1 g/dL — ABNORMAL LOW (ref 13.0–17.0)
Hemoglobin: 10.5 g/dL — ABNORMAL LOW (ref 13.0–17.0)
Hemoglobin: 10.7 g/dL — ABNORMAL LOW (ref 13.0–17.0)
Immature Granulocytes: 4 %
Immature Granulocytes: 2 %
Immature Granulocytes: 5 %
Lymphocytes Relative: 6 %
Lymphocytes Relative: 4 %
Lymphocytes Relative: 4 %
Lymphs Abs: 1.2 10*3/uL (ref 0.7–4.0)
Lymphs Abs: 0.6 10*3/uL — ABNORMAL LOW (ref 0.7–4.0)
Lymphs Abs: 0.8 10*3/uL (ref 0.7–4.0)
MCH: 35.3 pg — ABNORMAL HIGH (ref 26.0–34.0)
MCH: 35 pg — ABNORMAL HIGH (ref 26.0–34.0)
MCH: 35.3 pg — ABNORMAL HIGH (ref 26.0–34.0)
MCHC: 36.1 g/dL — ABNORMAL HIGH (ref 30.0–36.0)
MCHC: 35.6 g/dL (ref 30.0–36.0)
MCHC: 36.4 g/dL — ABNORMAL HIGH (ref 30.0–36.0)
MCV: 97.9 fL (ref 80.0–100.0)
MCV: 98.3 fL (ref 80.0–100.0)
MCV: 97 fL (ref 80.0–100.0)
Monocytes Absolute: 0.6 10*3/uL (ref 0.1–1.0)
Monocytes Absolute: 0.2 10*3/uL (ref 0.1–1.0)
Monocytes Absolute: 0.6 10*3/uL (ref 0.1–1.0)
Monocytes Relative: 3 %
Monocytes Relative: 1 %
Monocytes Relative: 3 %
Neutro Abs: 16.7 10*3/uL — ABNORMAL HIGH (ref 1.7–7.7)
Neutro Abs: 15.5 10*3/uL — ABNORMAL HIGH (ref 1.7–7.7)
Neutro Abs: 19 10*3/uL — ABNORMAL HIGH (ref 1.7–7.7)
Neutrophils Relative %: 87 %
Neutrophils Relative %: 93 %
Neutrophils Relative %: 88 %
Platelets: 33 10*3/uL — ABNORMAL LOW (ref 150–400)
Platelets: 34 10*3/uL — ABNORMAL LOW (ref 150–400)
Platelets: 32 10*3/uL — ABNORMAL LOW (ref 150–400)
RBC: 2.86 MIL/uL — ABNORMAL LOW (ref 4.22–5.81)
RBC: 3 MIL/uL — ABNORMAL LOW (ref 4.22–5.81)
RBC: 3.03 MIL/uL — ABNORMAL LOW (ref 4.22–5.81)
RDW: 14.1 % (ref 11.5–15.5)
RDW: 14.3 % (ref 11.5–15.5)
RDW: 14.2 % (ref 11.5–15.5)
WBC: 19.4 10*3/uL — ABNORMAL HIGH (ref 4.0–10.5)
WBC: 16.8 10*3/uL — ABNORMAL HIGH (ref 4.0–10.5)
WBC: 21.5 10*3/uL — ABNORMAL HIGH (ref 4.0–10.5)
nRBC: 0.1 % (ref 0.0–0.2)
nRBC: 0.1 % (ref 0.0–0.2)
nRBC: 0 % (ref 0.0–0.2)

## 2020-08-23 LAB — URINALYSIS, COMPLETE (UACMP) WITH MICROSCOPIC
Bacteria, UA: NONE SEEN
Glucose, UA: NEGATIVE mg/dL
Hgb urine dipstick: NEGATIVE
Ketones, ur: NEGATIVE mg/dL
Leukocytes,Ua: NEGATIVE
Nitrite: NEGATIVE
Protein, ur: 30 mg/dL — AB
Specific Gravity, Urine: 1.019 (ref 1.005–1.030)
pH: 5 (ref 5.0–8.0)

## 2020-08-23 LAB — BASIC METABOLIC PANEL
Anion gap: 16 — ABNORMAL HIGH (ref 5–15)
Anion gap: 18 — ABNORMAL HIGH (ref 5–15)
BUN: 131 mg/dL — ABNORMAL HIGH (ref 6–20)
BUN: 120 mg/dL — ABNORMAL HIGH (ref 6–20)
CO2: 17 mmol/L — ABNORMAL LOW (ref 22–32)
CO2: 14 mmol/L — ABNORMAL LOW (ref 22–32)
Calcium: 7.4 mg/dL — ABNORMAL LOW (ref 8.9–10.3)
Calcium: 7 mg/dL — ABNORMAL LOW (ref 8.9–10.3)
Chloride: 92 mmol/L — ABNORMAL LOW (ref 98–111)
Chloride: 94 mmol/L — ABNORMAL LOW (ref 98–111)
Creatinine, Ser: 3.41 mg/dL — ABNORMAL HIGH (ref 0.61–1.24)
Creatinine, Ser: 3.26 mg/dL — ABNORMAL HIGH (ref 0.61–1.24)
GFR, Estimated: 23 mL/min — ABNORMAL LOW (ref >60)
GFR, Estimated: 24 mL/min — ABNORMAL LOW (ref >60)
Glucose, Bld: 96 mg/dL (ref 70–99)
Glucose, Bld: 124 mg/dL — ABNORMAL HIGH (ref 70–99)
Potassium: 3.7 mmol/L (ref 3.5–5.1)
Potassium: 3.8 mmol/L (ref 3.5–5.1)
Sodium: 125 mmol/L — ABNORMAL LOW (ref 135–145)
Sodium: 126 mmol/L — ABNORMAL LOW (ref 135–145)

## 2020-08-23 LAB — LIPASE, BLOOD: Lipase: 99 U/L — ABNORMAL HIGH (ref 11–51)

## 2020-08-23 LAB — ACETAMINOPHEN LEVEL: Acetaminophen (Tylenol), Serum: 11 ug/mL (ref 10–30)

## 2020-08-23 LAB — TYPE AND SCREEN
ABO/RH(D): O NEG
Antibody Screen: NEGATIVE

## 2020-08-23 LAB — HEPATITIS PANEL, ACUTE
HCV Ab: NONREACTIVE
Hep A IgM: NONREACTIVE
Hep B C IgM: NONREACTIVE
Hepatitis B Surface Ag: NONREACTIVE

## 2020-08-23 LAB — SODIUM, URINE, RANDOM: Sodium, Ur: <10 mmol/L

## 2020-08-23 LAB — CK: Total CK: 47 U/L — ABNORMAL LOW (ref 49–397)

## 2020-08-23 LAB — HIV ANTIBODY (ROUTINE TESTING W REFLEX): HIV Screen 4th Generation wRfx: NONREACTIVE

## 2020-08-23 LAB — D-DIMER, QUANTITATIVE: D-Dimer, Quant: 2.78 ug/mL-FEU — ABNORMAL HIGH (ref 0.00–0.50)

## 2020-08-23 LAB — ABO/RH: ABO/RH(D): O NEG

## 2020-08-23 LAB — PATHOLOGIST SMEAR REVIEW

## 2020-08-23 LAB — CREATININE, URINE, RANDOM: Creatinine, Urine: 152.33 mg/dL

## 2020-08-23 LAB — C-REACTIVE PROTEIN: CRP: 26.8 mg/dL — ABNORMAL HIGH (ref <1.0)

## 2020-08-23 MED ORDER — SODIUM CHLORIDE 0.9 % IV SOLN: INTRAVENOUS | Status: AC

## 2020-08-23 MED ORDER — SODIUM CHLORIDE 0.9 % IV SOLN
8.0000 mg/h | INTRAVENOUS | Status: DC
  Administered 2020-08-23 – 2020-08-26 (×5): 8 mg/h via INTRAVENOUS
  Filled 2020-08-23 (×9): qty 80

## 2020-08-23 MED ORDER — LABETALOL HCL 5 MG/ML IV SOLN: 10.0000 mg | INTRAVENOUS | Status: DC | PRN

## 2020-08-23 MED ORDER — PANTOPRAZOLE SODIUM 40 MG IV SOLR
40.0000 mg | INTRAVENOUS | Status: DC
  Administered 2020-08-23: 40 mg via INTRAVENOUS
  Filled 2020-08-23: qty 40

## 2020-08-23 MED ORDER — ACETAMINOPHEN 325 MG PO TABS
650.0000 mg | ORAL_TABLET | Freq: Four times a day (QID) | ORAL | Status: DC | PRN
  Administered 2020-08-23: 650 mg via ORAL
  Filled 2020-08-23: qty 2

## 2020-08-23 MED ORDER — SODIUM CHLORIDE 0.9 % IV SOLN
2.0000 g | INTRAVENOUS | Status: DC
  Administered 2020-08-23 – 2020-08-29 (×7): 2 g via INTRAVENOUS
  Filled 2020-08-23: qty 2
  Filled 2020-08-23: qty 20
  Filled 2020-08-23 (×3): qty 2
  Filled 2020-08-23: qty 20
  Filled 2020-08-23: qty 2

## 2020-08-23 MED ORDER — SODIUM CHLORIDE 0.9% IV SOLUTION: Freq: Once | INTRAVENOUS | Status: AC

## 2020-08-23 MED ORDER — SODIUM CHLORIDE 0.9 % IV SOLN
100.0000 mg | Freq: Every day | INTRAVENOUS | Status: AC
  Administered 2020-08-24 – 2020-08-25 (×2): 100 mg via INTRAVENOUS
  Filled 2020-08-23 (×2): qty 20

## 2020-08-23 MED ORDER — SODIUM CHLORIDE 0.9 % IV SOLN
200.0000 mg | Freq: Once | INTRAVENOUS | Status: AC
  Administered 2020-08-23: 200 mg via INTRAVENOUS
  Filled 2020-08-23: qty 200

## 2020-08-23 MED ORDER — PANTOPRAZOLE SODIUM 40 MG IV SOLR
40.0000 mg | Freq: Two times a day (BID) | INTRAVENOUS | Status: DC
  Administered 2020-08-26 – 2020-08-27 (×2): 40 mg via INTRAVENOUS
  Filled 2020-08-23 (×2): qty 40

## 2020-08-23 NOTE — ED Notes (Signed)
Patient was given his dinner tray.

## 2020-08-23 NOTE — Consult Note (Addendum)
Reason for Consult: AKI Referring Physician: Tyson Babinski, MD  [name] is an 35 y.o. male with a PMH significant for HTN, chronic alcohol use, depression, and left hip AVN who presented to [facility] ED after developing epistaxis and one episode of N/V.  He had been taking diclofenac on a daily basis for many weeks as well as tylenol 1 gram bid.  In the ED he was hypotensive with BP in the low 90's and given IV normal saline boluses.  CXR unremarkable, BUN 120, Cr 3.44, K 4.4, CO2 20, alb 2.3, AST 271, ALT 76, NH3 44, WBC 24.6, Hgb 11.4, platelets 30, INR 1.4, Etoh <10, negative hepatitis panel.  He reported to the ED MD that the emesis was blackish red.  He has also been c/o increasing fatigue and malaise.  He was covid+ in the ED (has not been vaccinated but had covid in August 2020).  Renal US without obstruction, liver US consistent with fatty infiltration.  We were consulted to further evaluate and manage his  AKI.  The trend in Scr is seen below.   He does admit to poor po intake over the past week.  Trend in Creatinine: Creatinine, Ser  Date/Time Value Ref Range Status  08/23/2020 04:26 AM 3.41 (H) 0.61 - 1.24 mg/dL Final  63/87/5643 32:95 PM 3.44 (H) 0.61 - 1.24 mg/dL Final  18/84/1660 6.30    03/16/20 0.68    02/24/2019 03:20 PM 0.68 0.61 - 1.24 mg/dL Final  16/07/930 35:57 AM 1.00 0.61 - 1.24 mg/dL Final  32/20/2542 70:62 AM 0.83 0.61 - 1.24 mg/dL Final  37/62/8315 17:61 AM 0.89 0.50 - 1.35 mg/dL Final  60/73/7106 26:94 AM 0.91 0.50 - 1.35 mg/dL Final  85/46/2703 50:09 AM 0.87 0.50 - 1.35 mg/dL Final  38/18/2993 71:69 AM 1.30 0.50 - 1.35 mg/dL Final  67/89/3810 17:51 AM 0.88 0.50 - 1.35 mg/dL Final  02/58/5277 82:42 AM 0.86 0.50 - 1.35 mg/dL Final  35/36/1443 15:40 AM 0.87 0.50 - 1.35 mg/dL Final  08/67/6195 09:32 AM 0.90 0.4 - 1.5 mg/dL Final    PMH:   Past Medical History:  Diagnosis Date  . Alcohol abuse   . Asthma   . Depression   . Hypertension     PSH:   Past  Surgical History:  Procedure Laterality Date  . FRACTURE SURGERY      Allergies:  Allergies  Allergen Reactions  . Cashew Nut (Anacardium Occidentale) Skin Test Nausea And Vomiting    Allergy to cashews only; Tolerates other nuts  . Cashew Nut Oil Nausea And Vomiting    Allergy to cashews only; Tolerates other nuts    Medications:   Prior to Admission medications   Medication Sig Start Date End Date Taking? Authorizing Provider  aspirin EC 81 MG tablet Take 81 mg by mouth daily. Swallow whole.   Yes [provider]  carvedilol (COREG) 3.125 MG tablet Take 3.125 mg by mouth 2 (two) times daily. 07/28/20  Yes [provider]  diclofenac (VOLTAREN) 75 MG EC tablet Take 75 mg by mouth 2 (two) times daily. 08/20/20  Yes [provider]  ibuprofen (ADVIL) 200 MG tablet Take 200 mg by mouth every 6 (six) hours as needed for moderate pain.   Yes [provider]  lisinopril-hydrochlorothiazide (ZESTORETIC) 20-25 MG tablet Take 1 tablet by mouth daily. 08/08/20  Yes [provider]  magnesium oxide (MAG-OX) 400 MG tablet Take 1 tablet by mouth daily. 07/28/20  Yes [provider]  methocarbamol (ROBAXIN)  750 MG tablet Take 750 mg by mouth 2 (two) times daily. 08/19/20  Yes [provider]  Multiple Vitamin (MULTIVITAMIN WITH MINERALS) TABS tablet Take 1 tablet by mouth daily. 10/16/13  Yes Rachael Fee, MD  rosuvastatin (CRESTOR) 5 MG tablet Take 5 mg by mouth daily. 08/16/20  Yes [provider]  sertraline (ZOLOFT) 25 MG tablet Take 25 mg by mouth daily. 08/20/20  Yes [provider]  traMADol (ULTRAM) 50 MG tablet Take 1 tablet (50 mg total) by mouth every 6 (six) hours as needed. Patient taking differently: Take 50 mg by mouth 2 (two) times daily. 02/18/20  Yes Delo, Riley Lam, MD  amoxicillin-clavulanate (AUGMENTIN) 875-125 MG per tablet Take 1 tablet by mouth every 12 (twelve) hours. Patient not taking: No sig reported  01/21/15   Patel-Mills, Lorelle Formosa, PA-C  buPROPion (WELLBUTRIN XL) 300 MG 24 hr tablet Take 1 tablet (300 mg total) by mouth daily. Patient not taking: No sig reported 10/16/13   Rachael Fee, MD  carbamazepine (TEGRETOL XR) 200 MG 12 hr tablet Take 1 tablet (200 mg total) by mouth at bedtime. Patient not taking: No sig reported 10/16/13   Rachael Fee, MD  hydrochlorothiazide (HYDRODIURIL) 12.5 MG tablet Take 1 tablet (12.5 mg total) by mouth daily. Patient not taking: No sig reported 02/24/19   Hedges, Tinnie Gens, PA-C  hydrOXYzine (ATARAX/VISTARIL) 25 MG tablet Take 1 tablet (25 mg total) by mouth every 6 (six) hours as needed for anxiety (or CIWA score </= 10). Patient not taking: No sig reported 10/16/13   Rachael Fee, MD  predniSONE (DELTASONE) 10 MG tablet Take 2 tablets (20 mg total) by mouth 2 (two) times daily with a meal. Patient not taking: No sig reported 02/18/20   Geoffery Lyons, MD  traZODone (DESYREL) 50 MG tablet Take 1 tablet (50 mg total) by mouth at bedtime as needed and may repeat dose one time if needed for sleep. For sleep Patient not taking: No sig reported 10/16/13   Rachael Fee, MD    Inpatient medications: . sodium chloride   Intravenous Once  . folic acid  1 mg Oral Daily  . LORazepam  0-4 mg Oral Q6H   Followed by  . [START ON 08/24/2020] LORazepam  0-4 mg Oral Q12H  . multivitamin with minerals  1 tablet Oral Daily  . [START ON 08/26/2020] pantoprazole  40 mg Intravenous Q12H  . thiamine  100 mg Oral Daily   Or  . thiamine  100 mg Intravenous Daily    Discontinued Meds:   Medications Discontinued During This Encounter  Medication Reason  . acetaminophen (TYLENOL) 500 MG tablet Patient Preference  . fluticasone (FLONASE) 50 MCG/ACT nasal spray Patient Preference  . venlafaxine XR (EFFEXOR-XR) 37.5 MG 24 hr capsule Patient Preference  . pantoprazole (PROTONIX) injection 40 mg     Social History:  reports that he has been smoking cigars. He has never used  smokeless tobacco. He reports current alcohol use of about 12.0 - 18.0 standard drinks of alcohol per week. He reports current drug use. Drug: Marijuana.  Family History:   Family History  Problem Relation Age of Onset  . Cirrhosis Neg Hx   . Diabetes Mellitus II Neg Hx     Pertinent items are noted in HPI. Weight change:   Intake/Output Summary (Last 24 hours) at 08/23/2020 1349 Last data filed at 08/22/2020 2217 Gross per 24 hour  Intake 2000 ml  Output --  Net 2000 ml  BP 103/64 (BP Location: Left Arm)   Pulse 94   Temp 97.9 F (36.6 C) (Oral)   Resp (!) 26   SpO2 93%  Vitals:   08/23/20 1000 08/23/20 1015 08/23/20 1030 08/23/20 1322  BP:   111/64 103/64  Pulse: 98 95 98 94  Resp: (!) 27 (!) 26 (!) 30 (!) 26  Temp:      TempSrc:      SpO2: 94% 93% 93% 93%     General appearance: cooperative, no distress and slowed mentation Head: Normocephalic, without obvious abnormality, atraumatic Eyes: bilateral conjunctival hemorrhages  Resp: clear to auscultation bilaterally Cardio: regular rate and rhythm, S1, S2 normal, no murmur, click, rub or gallop GI: distended abdomen, +BS, soft, NT Extremities: edema trace bilateral lower extremity edema  Labs: Basic Metabolic Panel: Recent Labs  Lab 08/22/20 1817 08/23/20 0426  NA 124* 125*  K 4.4 3.7  CL 88* 92*  CO2 20* 17*  GLUCOSE 148* 96  BUN 120* 131*  CREATININE 3.44* 3.41*  ALBUMIN 2.3* 2.3*  CALCIUM 8.2* 7.4*   Liver Function Tests: Recent Labs  Lab 08/22/20 1817 08/23/20 0426  AST 271* 195*  ALT 76* 59*  ALKPHOS 212* 194*  BILITOT 9.6* 8.9*  PROT 7.3 6.4*  ALBUMIN 2.3* 2.3*   No results for input(s): LIPASE, AMYLASE in the last 168 hours. Recent Labs  Lab 08/22/20 1817  AMMONIA 44*   CBC: Recent Labs  Lab 08/22/20 1817 08/23/20 0426 08/23/20 1103 08/23/20 1259  WBC 24.6* 19.4* 16.8* 21.5*  NEUTROABS 21.9* 16.7* 15.5* PENDING  HGB 11.4* 10.1* 10.5* 10.7*  HCT 31.3* 28.0* 29.5* 29.4*  MCV  98.4 97.9 98.3 97.0  PLT 30* 33* 34* 32*   PT/INR: @LABRCNTIP (inr:5) Cardiac Enzymes: )No results for input(s): CKTOTAL, CKMB, CKMBINDEX, TROPONINI in the last 168 hours. CBG: No results for input(s): GLUCAP in the last 168 hours.  Iron Studies: No results for input(s): IRON, TIBC, TRANSFERRIN, FERRITIN in the last 168 hours.  Xrays/Other Studies: DG Chest 2 View  Result Date: 08/22/2020 CLINICAL DATA:  Elevated white blood cell count EXAM: CHEST - 2 VIEW COMPARISON:  None. FINDINGS: Cardiac shadow is mildly enlarged but accentuated by the frontal technique. The lungs are well aerated bilaterally. No focal infiltrate or effusion is seen. No bony abnormality is noted. IMPRESSION: No acute abnormality noted. Electronically Signed   By: 08/24/2020 M.D.   On: 08/22/2020 19:35   08/24/2020 RENAL  Result Date: 08/23/2020 CLINICAL DATA:  Acute renal injury EXAM: RENAL / URINARY TRACT ULTRASOUND COMPLETE COMPARISON:  None. FINDINGS: Right Kidney: Renal measurements: 13.1 x 5.1 x 6.0 cm. = volume: 208 mL. Echogenicity within normal limits. No mass or hydronephrosis visualized. Left Kidney: Renal measurements: 14.4 x 5.7 x 6.3 cm. = volume: 272 mL. Echogenicity within normal limits. No mass or hydronephrosis visualized. Bladder: Appears normal for degree of bladder distention. Other: Increased echogenicity in the liver is noted consistent with fatty infiltration. Spleen is mildly prominent but maintains its spleniform shape. IMPRESSION: No renal abnormality is noted. Changes of fatty liver and mild splenomegaly. Electronically Signed   By: 10/21/2020 M.D.   On: 08/23/2020 00:13   10/21/2020 Abdomen Limited  Result Date: 08/22/2020 CLINICAL DATA:  Jaundice.  Concern for liver cirrhosis. EXAM: ULTRASOUND ABDOMEN LIMITED RIGHT UPPER QUADRANT COMPARISON:  None. FINDINGS: Gallbladder: Physiologically distended. No gallstones or wall thickening visualized. No sonographic Murphy sign noted by sonographer. Common bile duct:  Diameter: 3 mm, not well-defined. Liver: Diffusely increased  parenchymal echogenicity, liver parenchyma is difficult to penetrate. No discrete focal hepatic lesion. No definite capsular nodularity. Portal vein is patent on color Doppler imaging with normal direction of blood flow towards the liver. Other: No right upper quadrant ascites. There are bilateral pleural effusions. Technically challenging exam due to habitus and patient's limited ability to hold breath. IMPRESSION: 1. Diffusely increased hepatic parenchymal echogenicity consistent with steatosis or other intrinsic hepatocellular disease. No capsular nodularity or other findings of cirrhosis. 2. Normal sonographic appearance of the gallbladder and biliary tree. 3. Pleural effusions are noted. There is no right upper quadrant ascites. Electronically Signed   By: Narda Rutherford M.D.   On: 08/22/2020 18:54     Assessment/Plan: 1.  AKI- likely multifactorial with NSAIDs and concomitant ACE/HCTZ, poor po intake, 3rd spacing/liver disease, and covid infection.  Starting to make some urine which is very dark and thick.  Will send urine studies for FeNa and check CK levels as well.  No indication for dialysis at this time and continue to follow UOP and SCr with IVF's and IV albumin.  Was concern for HRS which is very possible in light of GIB and abnormal LFT's, however will hold off on octreotide and midodrine for now.  No cirrhosis or ascites seen on [place]. 1. Continue to hold ACE/HCTZ and NSAIDs 2. Renal US without obstruction. 2. Epistaxis/hematemasis - GI following and suspect is from oropharynx due to thrombocytopenia, however may have gastritis due to NSAID use.  GI signed off, however may need EGD if bleeding persists. 3. Abnormal LFT's with elevated bilirubin- h/o daily alcohol use as well as tylenol.  Levels pending and follow LFT's 4. Thrombocytopenia- presumably due to Etoh and liver disease, however also on NSAIDs.  Awaiting platelet  transfusion. 5. covid-19 infection- pt without respiratory symptoms, no vaccine but prior infection in August 2020.  Continue with respiratory isolation.   Julien Nordmann Reiko Vinje 08/23/2020, 1:49 PM

## 2020-08-23 NOTE — ED Notes (Signed)
Patient has an copious amount of blood coming from mouth and blood contains clots. No signs of any laceration. VSS and patient reports no discomfort. Paged hospitalist at this time.

## 2020-08-23 NOTE — ED Notes (Signed)
GI at bedside

## 2020-08-23 NOTE — Progress Notes (Addendum)
PROGRESS NOTE  [name] EHU:[id number] DOB: [birth year] DOA: 08/22/2020 PCP: [name], PA   LOS: 1 day   Brief narrative:  [name] is a 35 y.o. male with history of alcohol use disorder, hypertension, depression, left hip avascular necrosis and pain being scheduled for possible surgery was recently placed on Robaxin and tramadol for worsening pain and had taken first dose yesterday started developing epistaxis and throwing up some blood 24 hours prior to presentation.   Patient had been on Voltaren as well.  In the ER , patient was initially noted to be hypotensive and received 2 L of normal saline.  Creatinine was significantly elevated at 3.4 with baseline creatinine of around 0.9.  LFTs were elevated with thrombocytopenia.  Patient also had leukocytosis.  Right upper quadrant ultrasound showed features concerning for  hepatic steatosis.  No definite evidence of cirrhosis.    Was positive for Covid infection and well. Patient admitted for acute renal failure with thrombocytopenia, epistaxis.   Assessment/Plan:  Principal Problem:   ARF (acute renal failure) (HCC) Active Problems:   Alcohol dependence (HCC)   Thrombocytopenia (HCC)   Alcoholic hepatitis   Hypertension   COVID-19 virus infection  Acute renal failure -could be multifactorial.  Patient was on Voltaren, lisinopril HCTZ at home.  Possibility of hepatorenal syndrome as well.  Nephrology on board.  On Gentle normal saline and albumin.  Continue Rocephin IV.  Follow nephrology recommendations.  Lab Results  Component Value Date   CREATININE 3.41 (H) 08/23/2020   CREATININE 3.44 (H) 08/22/2020   CREATININE 0.68 02/24/2019    Severe thrombocytopenia with epistaxis could be from alcoholism, Covid infection.  Hepatitis panel negative.  HIV nonreactive.  LDH slightly elevated.  Pending haptoglobin level and peripheral blood smear.  Will transfuse platelets 2 units today due to ongoing epistaxis.  Would  consider FFP if needed.  We will continue to  monitor hemoglobin and platelets closely  Hyponatremia.  Sodium of 125 today.  Could be secondary to liver disease. Continue to monitor closely.  Check urinalysis-pending..  Nephrology on board.  COVID-19 infection.  No infiltrates.  Not hypoxic.  On remdesivir.  Trend inflammatory markers.  Check LFTs closely on remdesivir.  COVID-19 Labs  Recent Labs    08/22/20 2224 08/22/20 2225 08/23/20 0426  DDIMER  --  2.86* 2.78*  LDH 240*  --   --   CRP  --  29.4* 26.8*    Lab Results  Component Value Date   SARSCOV2NAA POSITIVE (A) 08/22/2020   SARSCOV2NAA DETECTED (A) 02/24/2019   Alcoholic hepatitis with jaundice -discriminant function score is around 25.2.    Hepatitis panel/HIV negative at this time.   GI has been consulted for further evaluation and treatment.  INR of 1.4.  No encephalopathy.  Anemia with  epistaxis/hematemesis.    Platelet count of 33,000 today.  History of epistaxis.  Admitting provider has consulted GI.  Follow GI recommendation.  Continue Protonix for now.  Patient will be transfused platelets today.  If continued oral/nasal bleeding, could consider ENT consultation..  Was taking aspirin as outpatient.  On hold.  Will continue on Protonix drip for now.  Spoke with GI at bedside.  Leukocytosis likely reactive.  On Rocephin for prophylaxis.   Will closely monitor.  Slightly trended down today  History of hypertension antihypertensives on hold at this time.  As needed labetalol.   Lisinopril and hydrochlorothiazide on hold due to AKI.  Ongoing alcohol use disorder.  Patient has  significantly cut down on his alcohol and drinks around 1-2 beers a day.  We will continue to monitor closely for withdrawal symptoms.  Continue CIWA protocol.  Left hip pain secondary to avascular necrosis per the patient.  Patient was supposed to have surgical intervention done as outpatient.  DVT prophylaxis: SCDs Start: 08/22/20 2135.  Code  Status: Full code  Family Communication: Spoke with the patient's significant other at bedside and answered all the questions.  Status is: Inpatient  Remains inpatient appropriate because:IV treatments appropriate due to intensity of illness or inability to take PO, Inpatient level of care appropriate due to severity of illness and GI/nephrology evaluation   Dispo: The patient is from: Home              Anticipated d/c is to: Home              Anticipated d/c date is: 3 days or more              Patient currently is not medically stable to d/c.   Difficult to place patient No  Consultants:  Nephrology  GI  Procedures:  Platelet transfusion  Anti-infectives:  Marland Kitchen Rocephin IV 2/1> . Remdesivir 2/1>  Anti-infectives (From admission, onward)   Start     Dose/Rate Route Frequency Ordered Stop   08/24/20 1000  remdesivir 100 mg in sodium chloride 0.9 % 100 mL IVPB       "Followed by" Linked Group Details   100 mg 200 mL/hr over 30 Minutes Intravenous Daily 08/23/20 0101 08/26/20 0959   08/23/20 0600  cefTRIAXone (ROCEPHIN) 2 g in sodium chloride 0.9 % 100 mL IVPB        2 g 200 mL/hr over 30 Minutes Intravenous Every 24 hours 08/23/20 0529     08/23/20 0130  remdesivir 200 mg in sodium chloride 0.9% 250 mL IVPB       "Followed by" Linked Group Details   200 mg 580 mL/hr over 30 Minutes Intravenous Once 08/23/20 0101 08/23/20 0150     Subjective: Today, patient was seen and examined at bedside.  Patient complaining of blood in the oral cavity.  Patient complains of the left hip pain.  Denies any nausea vomiting shortness of breath chest pain  Objective: Vitals:   08/23/20 0630 08/23/20 0645  BP:    Pulse: 87 88  Resp: (!) 28 (!) 26  Temp:    SpO2: 92% 90%    Intake/Output Summary (Last 24 hours) at 08/23/2020 9767 Last data filed at 08/22/2020 2217 Gross per 24 hour  Intake 2000 ml  Output --  Net 2000 ml   There were no vitals filed for this visit. There is no  height or weight on file to calculate BMI.   Physical Exam: GENERAL: Patient is alert awake and oriented. Not in obvious distress. HENT: Mild pallor noted, icterus noted. Pupils equally reactive to light. Oral mucosa is moist blood-tinged palatal region and gum area.  Bilateral subconjunctival hemorrhage noted. NECK: is supple, no gross swelling noted. CHEST: Clear to auscultation. No crackles or wheezes.  Diminished breath sounds bilaterally. CVS: S1 and S2 heard, no murmur. Regular rate and rhythm.  ABDOMEN: Soft, mild epigastric tenderness noted bowel sounds are present. EXTREMITIES: Bilateral lower extremity mild edema. CNS: Cranial nerves are intact. No focal motor deficits. SKIN: warm and dry without rashes.  Data Review: I have personally reviewed the following laboratory data and studies,  CBC: Recent Labs  Lab 08/22/20 1817 08/23/20 0426  WBC 24.6* 19.4*  NEUTROABS 21.9* 16.7*  HGB 11.4* 10.1*  HCT 31.3* 28.0*  MCV 98.4 97.9  PLT 30* 33*   Basic Metabolic Panel: Recent Labs  Lab 08/22/20 1817 08/23/20 0426  NA 124* 125*  K 4.4 3.7  CL 88* 92*  CO2 20* 17*  GLUCOSE 148* 96  BUN 120* 131*  CREATININE 3.44* 3.41*  CALCIUM 8.2* 7.4*   Liver Function Tests: Recent Labs  Lab 08/22/20 1817 08/23/20 0426  AST 271* 195*  ALT 76* 59*  ALKPHOS 212* 194*  BILITOT 9.6* 8.9*  PROT 7.3 6.4*  ALBUMIN 2.3* 2.3*   No results for input(s): LIPASE, AMYLASE in the last 168 hours. Recent Labs  Lab 08/22/20 1817  AMMONIA 44*   Cardiac Enzymes: No results for input(s): CKTOTAL, CKMB, CKMBINDEX, TROPONINI in the last 168 hours. BNP (last 3 results) No results for input(s): BNP in the last 8760 hours.  ProBNP (last 3 results) No results for input(s): PROBNP in the last 8760 hours.  CBG: No results for input(s): GLUCAP in the last 168 hours. Recent Results (from the past 240 hour(s))  SARS Coronavirus 2 by RT PCR (hospital order, performed in [facility] hospital  lab) Nasopharyngeal Nasopharyngeal Swab     Status: Abnormal   Collection Time: 08/22/20  7:18 PM   Specimen: Nasopharyngeal Swab  Result Value Ref Range Status   SARS Coronavirus 2 POSITIVE (A) NEGATIVE Final    Comment: RESULT CALLED TO, READ BACK BY AND VERIFIED WITH: ZULETA K. 1.31.22 @ 2100 BY MECIAL J. (NOTE) SARS-CoV-2 target nucleic acids are DETECTED  SARS-CoV-2 RNA is generally detectable in upper respiratory specimens  during the acute phase of infection.  Positive results are indicative  of the presence of the identified virus, but do not rule out bacterial infection or co-infection with other pathogens not detected by the test.  Clinical correlation with patient history and  other diagnostic information is necessary to determine patient infection status.  The expected result is negative.  Fact Sheet for Patients:   BoilerBrush.com.cy   Fact Sheet for Healthcare Providers:   https://pope.com/    This test is not yet approved or cleared by the [place] FDA and  has been authorized for detection and/or diagnosis of SARS-CoV-2 by FDA under an Emergency Use Authorization (EUA).  This EUA will remain in effect (meaning this  test can be used) for the duration of  the COVID-19 declaration under Section 564(b)(1) of the Act, 21 U.S.C. section 360-bbb-3(b)(1), unless the authorization is terminated or revoked sooner.  Performed at [facility], [address] [place], [place] [address]      Studies: DG Chest 2 View  Result Date: 08/22/2020 CLINICAL DATA:  Elevated white blood cell count EXAM: CHEST - 2 VIEW COMPARISON:  None. FINDINGS: Cardiac shadow is mildly enlarged but accentuated by the frontal technique. The lungs are well aerated bilaterally. No focal infiltrate or effusion is seen. No bony abnormality is noted. IMPRESSION: No acute abnormality noted. Electronically Signed   By: Alcide Clever  M.D.   On: 08/22/2020 19:35   US RENAL  Result Date: 08/23/2020 CLINICAL DATA:  Acute renal injury EXAM: RENAL / URINARY TRACT ULTRASOUND COMPLETE COMPARISON:  None. FINDINGS: Right Kidney: Renal measurements: 13.1 x 5.1 x 6.0 cm. = volume: 208 mL. Echogenicity within normal limits. No mass or hydronephrosis visualized. Left Kidney: Renal measurements: 14.4 x 5.7 x 6.3 cm. = volume: 272 mL. Echogenicity within normal limits. No mass or  hydronephrosis visualized. Bladder: Appears normal for degree of bladder distention. Other: Increased echogenicity in the liver is noted consistent with fatty infiltration. Spleen is mildly prominent but maintains its spleniform shape. IMPRESSION: No renal abnormality is noted. Changes of fatty liver and mild splenomegaly. Electronically Signed   By: Alcide Clever M.D.   On: 08/23/2020 00:13   US Abdomen Limited  Result Date: 08/22/2020 CLINICAL DATA:  Jaundice.  Concern for liver cirrhosis. EXAM: ULTRASOUND ABDOMEN LIMITED RIGHT UPPER QUADRANT COMPARISON:  None. FINDINGS: Gallbladder: Physiologically distended. No gallstones or wall thickening visualized. No sonographic Murphy sign noted by sonographer. Common bile duct: Diameter: 3 mm, not well-defined. Liver: Diffusely increased parenchymal echogenicity, liver parenchyma is difficult to penetrate. No discrete focal hepatic lesion. No definite capsular nodularity. Portal vein is patent on color Doppler imaging with normal direction of blood flow towards the liver. Other: No right upper quadrant ascites. There are bilateral pleural effusions. Technically challenging exam due to habitus and patient's limited ability to hold breath. IMPRESSION: 1. Diffusely increased hepatic parenchymal echogenicity consistent with steatosis or other intrinsic hepatocellular disease. No capsular nodularity or other findings of cirrhosis. 2. Normal sonographic appearance of the gallbladder and biliary tree. 3. Pleural effusions are noted. There is  no right upper quadrant ascites. Electronically Signed   By: Narda Rutherford M.D.   On: 08/22/2020 18:54      Joycelyn Das, MD  [facility] 08/23/2020  If 7PM-7AM, please contact night-coverage

## 2020-08-23 NOTE — Consult Note (Addendum)
Referring Provider: Dr. Tyson Babinski Primary Care Physician:  [name], PA Primary Gastroenterologist:  UNASSIGNED  Reason for Consultation:  Abnormal LFTs; Anemia  HPI: [name] is a 35 y.o. male with history of alcohol abuse, COVID infection presenting with epistaxis and weakness and found to have elevated LFTs with TB 8.9 (DB 6.2), ALP 194, AST 195, ALT 59. His girlfriend reports that he vomited up a small amount of red blood on Sunday and has not vomited since but he has been having nosebleeds starting yesterday and has spit out blood in the ER today. Red conjunctiva in both eyes today. Denies abdominal pain or melena. Feels dizzy at times. Used to drink 12 pack per day for 10 years but cut back to 1-2 beers per day starting one month ago. Has been taking Tylenol 1 g BID for hip pain and denies NSAIDs.   Past Medical History:  Diagnosis Date  . Alcohol abuse   . Asthma   . Depression   . Hypertension     Past Surgical History:  Procedure Laterality Date  . FRACTURE SURGERY      Prior to Admission medications   Medication Sig Start Date End Date Taking? Authorizing Provider  aspirin EC 81 MG tablet Take 81 mg by mouth daily. Swallow whole.   Yes [provider]  carvedilol (COREG) 3.125 MG tablet Take 3.125 mg by mouth 2 (two) times daily. 07/28/20  Yes [provider]  diclofenac (VOLTAREN) 75 MG EC tablet Take 75 mg by mouth 2 (two) times daily. 08/20/20  Yes [provider]  ibuprofen (ADVIL) 200 MG tablet Take 200 mg by mouth every 6 (six) hours as needed for moderate pain.   Yes [provider]  lisinopril-hydrochlorothiazide (ZESTORETIC) 20-25 MG tablet Take 1 tablet by mouth daily. 08/08/20  Yes [provider]  magnesium oxide (MAG-OX) 400 MG tablet Take 1 tablet by mouth daily. 07/28/20  Yes [provider]  methocarbamol (ROBAXIN) 750 MG tablet Take 750 mg by mouth 2 (two) times daily. 08/19/20  Yes [provider]  Multiple Vitamin (MULTIVITAMIN WITH MINERALS) TABS tablet Take 1 tablet by mouth daily. 10/16/13  Yes Rachael Fee, MD  rosuvastatin (CRESTOR) 5 MG tablet Take 5 mg by mouth daily. 08/16/20  Yes [provider]  sertraline (ZOLOFT) 25 MG tablet Take 25 mg by mouth daily. 08/20/20  Yes [provider]  traMADol (ULTRAM) 50 MG tablet Take 1 tablet (50 mg total) by mouth every 6 (six) hours as needed. Patient taking differently: Take 50 mg by mouth 2 (two) times daily. 02/18/20  Yes Delo, Riley Lam, MD  amoxicillin-clavulanate (AUGMENTIN) 875-125 MG per tablet Take 1 tablet by mouth every 12 (twelve) hours. Patient not taking: No sig reported 01/21/15   Patel-Mills, Lorelle Formosa, PA-C  buPROPion (WELLBUTRIN XL) 300 MG 24 hr tablet Take 1 tablet (300 mg total) by mouth daily. Patient not taking: No sig reported 10/16/13   Rachael Fee, MD  carbamazepine (TEGRETOL XR) 200 MG 12 hr tablet Take 1 tablet (200 mg total) by mouth at bedtime. Patient not taking: No sig reported 10/16/13   Rachael Fee, MD  hydrochlorothiazide (HYDRODIURIL) 12.5 MG tablet Take 1 tablet (12.5 mg total) by mouth daily. Patient not taking: No sig reported 02/24/19   Hedges, Tinnie Gens, PA-C  hydrOXYzine (ATARAX/VISTARIL) 25 MG tablet Take 1 tablet (25 mg total) by mouth every 6 (six) hours as needed for anxiety (or CIWA score </= 10). Patient not taking: No  sig reported 10/16/13   Rachael Fee, MD  predniSONE (DELTASONE) 10 MG tablet Take 2 tablets (20 mg total) by mouth 2 (two) times daily with a meal. Patient not taking: No sig reported 02/18/20   Geoffery Lyons, MD  traZODone (DESYREL) 50 MG tablet Take 1 tablet (50 mg total) by mouth at bedtime as needed and may repeat dose one time if needed for sleep. For sleep Patient not taking: No sig reported 10/16/13   Rachael Fee, MD    Scheduled Meds: . sodium chloride   Intravenous Once  . folic acid  1 mg Oral Daily  . LORazepam  0-4 mg Oral Q6H    Followed by  . [START ON 08/24/2020] LORazepam  0-4 mg Oral Q12H  . multivitamin with minerals  1 tablet Oral Daily  . [START ON 08/26/2020] pantoprazole  40 mg Intravenous Q12H  . thiamine  100 mg Oral Daily   Or  . thiamine  100 mg Intravenous Daily   Continuous Infusions: . sodium chloride Stopped (08/23/20 1421)  . albumin human 25 g (08/23/20 1252)  . cefTRIAXone (ROCEPHIN)  IV Stopped (08/23/20 1421)  . pantoprozole (PROTONIX) infusion 8 mg/hr (08/23/20 1259)  . [START ON 08/24/2020] remdesivir 100 mg in NS 100 mL     PRN Meds:.labetalol, LORazepam **OR** LORazepam  Allergies as of 08/22/2020 - Review Complete 08/22/2020  Allergen Reaction Noted  . Cashew nut (anacardium occidentale) skin test Nausea And Vomiting 01/03/2013  . Cashew nut oil Nausea And Vomiting 01/03/2013    Family History  Problem Relation Age of Onset  . Cirrhosis Neg Hx   . Diabetes Mellitus II Neg Hx     Social History   Socioeconomic History  . Marital status: Single    Spouse name: Not on file  . Number of children: Not on file  . Years of education: Not on file  . Highest education level: Not on file  Occupational History  . Not on file  Tobacco Use  . Smoking status: Current Some Day Smoker    Types: Cigars  . Smokeless tobacco: Never Used  Substance and Sexual Activity  . Alcohol use: Yes    Alcohol/week: 12.0 - 18.0 standard drinks    Types: 12 - 18 Cans of beer per week    Comment: heavy drinker  . Drug use: Yes    Types: Marijuana  . Sexual activity: Yes    Birth control/protection: None  Other Topics Concern  . Not on file  Social History Narrative  . Not on file   Social Determinants of Health   Financial Resource Strain: Not on file  Food Insecurity: Not on file  Transportation Needs: Not on file  Physical Activity: Not on file  Stress: Not on file  Social Connections: Not on file  Intimate Partner Violence: Not on file    Review of Systems: All negative except as  stated above in HPI.  Physical Exam: Vital signs: Vitals:   08/23/20 1030 08/23/20 1322  BP: 111/64 103/64  Pulse: 98 94  Resp: (!) 30 (!) 26  Temp:    SpO2: 93% 93%  T 97.9   General:   Somnolent, well-nourished, no acute distress Head: normocephalic, atraumatic Eyes: +icteric sclera; injected sclera ENT: oropharynx clear; red blood in mouth Neck: supple, nontender Lungs:  Clear throughout to auscultation.   No wheezes, crackles, or rhonchi. No acute distress. Heart:  Regular rate and rhythm; no murmurs, clicks, rubs,  or gallops. Abdomen: upper quadrant  tenderness with guarding, soft, nondistended, +BS  Rectal:  Deferred Ext: +LE edema  GI:  Lab Results: Recent Labs    08/23/20 0426 08/23/20 1103 08/23/20 1259  WBC 19.4* 16.8* 21.5*  HGB 10.1* 10.5* 10.7*  HCT 28.0* 29.5* 29.4*  PLT 33* 34* 32*   BMET Recent Labs    08/22/20 1817 08/23/20 0426  NA 124* 125*  K 4.4 3.7  CL 88* 92*  CO2 20* 17*  GLUCOSE 148* 96  BUN 120* 131*  CREATININE 3.44* 3.41*  CALCIUM 8.2* 7.4*   LFT Recent Labs    08/23/20 0426  PROT 6.4*  ALBUMIN 2.3*  AST 195*  ALT 59*  ALKPHOS 194*  BILITOT 8.9*  BILIDIR 6.2*  IBILI 2.7*   PT/INR Recent Labs    08/22/20 1817  LABPROT 16.4*  INR 1.4*     Studies/Results: DG Chest 2 View  Result Date: 08/22/2020 CLINICAL DATA:  Elevated white blood cell count EXAM: CHEST - 2 VIEW COMPARISON:  None. FINDINGS: Cardiac shadow is mildly enlarged but accentuated by the frontal technique. The lungs are well aerated bilaterally. No focal infiltrate or effusion is seen. No bony abnormality is noted. IMPRESSION: No acute abnormality noted. Electronically Signed   By: Alcide Clever M.D.   On: 08/22/2020 19:35   US RENAL  Result Date: 08/23/2020 CLINICAL DATA:  Acute renal injury EXAM: RENAL / URINARY TRACT ULTRASOUND COMPLETE COMPARISON:  None. FINDINGS: Right Kidney: Renal measurements: 13.1 x 5.1 x 6.0 cm. = volume: 208 mL. Echogenicity  within normal limits. No mass or hydronephrosis visualized. Left Kidney: Renal measurements: 14.4 x 5.7 x 6.3 cm. = volume: 272 mL. Echogenicity within normal limits. No mass or hydronephrosis visualized. Bladder: Appears normal for degree of bladder distention. Other: Increased echogenicity in the liver is noted consistent with fatty infiltration. Spleen is mildly prominent but maintains its spleniform shape. IMPRESSION: No renal abnormality is noted. Changes of fatty liver and mild splenomegaly. Electronically Signed   By: Alcide Clever M.D.   On: 08/23/2020 00:13   US Abdomen Limited  Result Date: 08/22/2020 CLINICAL DATA:  Jaundice.  Concern for liver cirrhosis. EXAM: ULTRASOUND ABDOMEN LIMITED RIGHT UPPER QUADRANT COMPARISON:  None. FINDINGS: Gallbladder: Physiologically distended. No gallstones or wall thickening visualized. No sonographic Murphy sign noted by sonographer. Common bile duct: Diameter: 3 mm, not well-defined. Liver: Diffusely increased parenchymal echogenicity, liver parenchyma is difficult to penetrate. No discrete focal hepatic lesion. No definite capsular nodularity. Portal vein is patent on color Doppler imaging with normal direction of blood flow towards the liver. Other: No right upper quadrant ascites. There are bilateral pleural effusions. Technically challenging exam due to habitus and patient's limited ability to hold breath. IMPRESSION: 1. Diffusely increased hepatic parenchymal echogenicity consistent with steatosis or other intrinsic hepatocellular disease. No capsular nodularity or other findings of cirrhosis. 2. Normal sonographic appearance of the gallbladder and biliary tree. 3. Pleural effusions are noted. There is no right upper quadrant ascites. Electronically Signed   By: Narda Rutherford M.D.   On: 08/22/2020 18:54    Impression/Plan: Anemia likely due to epistaxis and doubt upper GI source. Severe thrombocytopenia and with epistaxis needs a platelet transfusion.  Elevated LFTs likely multifactorial with alcohol abuse and COVID infection. Viral hepatitis panel negative. Protonix drip. Hold off on EGD unless showing signs of hematemesis with hemodynamic instability. Supportive care. Will sign off. Call if questions.    LOS: 1 day   Shirley Friar  08/23/2020, 2:36 PM  Questions please  call [phone]

## 2020-08-23 NOTE — Progress Notes (Signed)
Received new admit from ED via stretcher. Pt transferred to bed without difficulty. Verbalizes no complaints at present and aware he still has one more unit of platelets to transfuse.  Advised lab personnel to hold off on CBC until 2nd unit transfused.

## 2020-08-23 NOTE — ED Notes (Signed)
Patient was given his lunch tray.

## 2020-08-23 NOTE — ED Notes (Signed)
Patient provided with bedside commode

## 2020-08-23 NOTE — ED Notes (Signed)
Family at bedside. 

## 2020-08-23 NOTE — ED Notes (Signed)
Velna Hatchet in lab to add on [facility]

## 2020-08-23 NOTE — Consult Note (Addendum)
Referring Provider: Dr. Tyson Babinski Primary Care Physician:  [name], [name] Primary Gastroenterologist:  Gentry Fitz  Reason for Consultation:  Abnormal LFTs   HPI: [name] is a 35 y.o. male with history of avascular necrosis of the hip, alcohol use, and current AKI and COVID-19 infection presenting for consultation of abnormal LFTs.  He presented to the ED yesterday, as he started having nosebleeds, a burst blood vessel in his eye, and one episode of emesis.  He states the emesis was blackish red.  He has also felt more fatigued lately, though denies shortness of breath or chest pain.  He was found to be Covid positive in the ED.  He denies any abdominal pain, changes in stool, melena, or hematochezia.  Denies dysphagia or unexplained weight loss.  He has been taking 1000 mg Tylenol twice daily for at least 1 month due to pain from AVN of the hip.  He did take some ibuprofen prior to that, but he states he has not taken ibuprofen recently.  Takes 81 mg aspirin daily.  Denies blood thinner use.  He was recently started on Robaxin and Tylenol by his orthopedist.  He has been drinking approximately 12 beers per day for the last 10 years, though he states he has been gradually decreasing his intake and is now only drinking 1-2 beers per day.  Denies family history of liver disease, clotting disorders, or gastrointestinal malignancies.  He has never had an EGD or colonoscopy.  Past Medical History:  Diagnosis Date  . Alcohol abuse   . Asthma   . Depression   . Hypertension     Past Surgical History:  Procedure Laterality Date  . FRACTURE SURGERY      Prior to Admission medications   Medication Sig Start Date End Date Taking? Authorizing Provider  aspirin EC 81 MG tablet Take 81 mg by mouth daily. Swallow whole.   Yes [provider]  carvedilol (COREG) 3.125 MG tablet Take 3.125 mg by mouth 2 (two) times daily. 07/28/20  Yes [provider]  diclofenac  (VOLTAREN) 75 MG EC tablet Take 75 mg by mouth 2 (two) times daily. 08/20/20  Yes [provider]  ibuprofen (ADVIL) 200 MG tablet Take 200 mg by mouth every 6 (six) hours as needed for moderate pain.   Yes [provider]  lisinopril-hydrochlorothiazide (ZESTORETIC) 20-25 MG tablet Take 1 tablet by mouth daily. 08/08/20  Yes [provider]  magnesium oxide (MAG-OX) 400 MG tablet Take 1 tablet by mouth daily. 07/28/20  Yes [provider]  methocarbamol (ROBAXIN) 750 MG tablet Take 750 mg by mouth 2 (two) times daily. 08/19/20  Yes [provider]  Multiple Vitamin (MULTIVITAMIN WITH MINERALS) TABS tablet Take 1 tablet by mouth daily. 10/16/13  Yes Rachael Fee, MD  rosuvastatin (CRESTOR) 5 MG tablet Take 5 mg by mouth daily. 08/16/20  Yes [provider]  sertraline (ZOLOFT) 25 MG tablet Take 25 mg by mouth daily. 08/20/20  Yes [provider]  traMADol (ULTRAM) 50 MG tablet Take 1 tablet (50 mg total) by mouth every 6 (six) hours as needed. Patient taking differently: Take 50 mg by mouth 2 (two) times daily. 02/18/20  Yes Delo, Riley Lam, MD  amoxicillin-clavulanate (AUGMENTIN) 875-125 MG per tablet Take 1 tablet by mouth every 12 (twelve) hours. Patient not taking: No sig reported 01/21/15   Patel-Mills, Lorelle Formosa, PA-C  buPROPion (WELLBUTRIN XL) 300 MG 24 hr tablet Take 1 tablet (300 mg total) by mouth daily. Patient  not taking: No sig reported 10/16/13   Rachael Fee, MD  carbamazepine (TEGRETOL XR) 200 MG 12 hr tablet Take 1 tablet (200 mg total) by mouth at bedtime. Patient not taking: No sig reported 10/16/13   Rachael Fee, MD  hydrochlorothiazide (HYDRODIURIL) 12.5 MG tablet Take 1 tablet (12.5 mg total) by mouth daily. Patient not taking: No sig reported 02/24/19   Hedges, Tinnie Gens, PA-C  hydrOXYzine (ATARAX/VISTARIL) 25 MG tablet Take 1 tablet (25 mg total) by mouth every 6 (six) hours as needed for anxiety (or CIWA score </= 10). Patient  not taking: No sig reported 10/16/13   Rachael Fee, MD  predniSONE (DELTASONE) 10 MG tablet Take 2 tablets (20 mg total) by mouth 2 (two) times daily with a meal. Patient not taking: No sig reported 02/18/20   Geoffery Lyons, MD  traZODone (DESYREL) 50 MG tablet Take 1 tablet (50 mg total) by mouth at bedtime as needed and may repeat dose one time if needed for sleep. For sleep Patient not taking: No sig reported 10/16/13   Rachael Fee, MD    Scheduled Meds: . sodium chloride   Intravenous Once  . folic acid  1 mg Oral Daily  . LORazepam  0-4 mg Oral Q6H   Followed by  . [START ON 08/24/2020] LORazepam  0-4 mg Oral Q12H  . multivitamin with minerals  1 tablet Oral Daily  . [START ON 08/26/2020] pantoprazole  40 mg Intravenous Q12H  . thiamine  100 mg Oral Daily   Or  . thiamine  100 mg Intravenous Daily   Continuous Infusions: . sodium chloride 75 mL/hr at 08/23/20 0530  . albumin human Stopped (08/22/20 2216)  . cefTRIAXone (ROCEPHIN)  IV 2 g (08/23/20 0530)  . pantoprozole (PROTONIX) infusion    . [START ON 08/24/2020] remdesivir 100 mg in NS 100 mL     PRN Meds:.labetalol, LORazepam **OR** LORazepam  Allergies as of 08/22/2020 - Review Complete 08/22/2020  Allergen Reaction Noted  . Cashew nut (anacardium occidentale) skin test Nausea And Vomiting 01/03/2013  . Cashew nut oil Nausea And Vomiting 01/03/2013    Family History  Problem Relation Age of Onset  . Cirrhosis Neg Hx   . Diabetes Mellitus II Neg Hx     Social History   Socioeconomic History  . Marital status: Single    Spouse name: Not on file  . Number of children: Not on file  . Years of education: Not on file  . Highest education level: Not on file  Occupational History  . Not on file  Tobacco Use  . Smoking status: Current Some Day Smoker    Types: Cigars  . Smokeless tobacco: Never Used  Substance and Sexual Activity  . Alcohol use: Yes    Alcohol/week: 12.0 - 18.0 standard drinks    Types: 12 - 18  Cans of beer per week    Comment: heavy drinker  . Drug use: Yes    Types: Marijuana  . Sexual activity: Yes    Birth control/protection: None  Other Topics Concern  . Not on file  Social History Narrative  . Not on file   Social Determinants of Health   Financial Resource Strain: Not on file  Food Insecurity: Not on file  Transportation Needs: Not on file  Physical Activity: Not on file  Stress: Not on file  Social Connections: Not on file  Intimate Partner Violence: Not on file    Review of Systems: Review of Systems  Constitutional: Positive for malaise/fatigue. Negative for chills and fever.  HENT: Negative for hearing loss and tinnitus.   Eyes: Positive for redness. Negative for pain.  Respiratory: Positive for cough. Negative for shortness of breath.   Cardiovascular: Negative for chest pain and palpitations.  Gastrointestinal: Positive for nausea and vomiting. Negative for abdominal pain, blood in stool, constipation, diarrhea, heartburn and melena.  Genitourinary: Negative for flank pain and hematuria.       Dark urine  Musculoskeletal: Positive for joint pain. Negative for falls.  Skin: Negative for itching and rash.  Neurological: Negative for seizures and loss of consciousness.  Endo/Heme/Allergies: Negative for polydipsia. Does not bruise/bleed easily.  Psychiatric/Behavioral: Positive for substance abuse. The patient is not nervous/anxious.     Physical Exam: Vital signs: Vitals:   08/23/20 1015 08/23/20 1030  BP:  111/64  Pulse: 95 98  Resp: (!) 26 (!) 30  Temp:    SpO2: 93% 93%     Physical Exam Vitals reviewed.  Constitutional:      General: He is awake. He is not in acute distress. HENT:     Head: Normocephalic and atraumatic.     Nose: Nose normal. No congestion.     Mouth/Throat:     Mouth: Mucous membranes are moist.     Pharynx: Oropharynx is clear.     Comments: Fresh blood in mouth Eyes:     General: Scleral icterus present.      Extraocular Movements: Extraocular movements intact.     Comments: Red sclera (left eye)  Cardiovascular:     Rate and Rhythm: Normal rate and regular rhythm.     Pulses: Normal pulses.  Pulmonary:     Effort: Pulmonary effort is normal. No respiratory distress.  Abdominal:     General: Bowel sounds are normal. There is no distension.     Palpations: Abdomen is soft. There is no mass.     Tenderness: There is abdominal tenderness (mild, epigastric). There is no guarding or rebound.     Hernia: No hernia is present.  Musculoskeletal:        General: No swelling or tenderness.     Cervical back: Normal range of motion and neck supple.  Skin:    General: Skin is warm and dry.     Coloration: Skin is jaundiced.  Neurological:     General: No focal deficit present.     Mental Status: He is oriented to person, place, and time.  Psychiatric:        Mood and Affect: Mood normal.        Behavior: Behavior normal. Behavior is cooperative.      GI:  Lab Results: Recent Labs    08/22/20 1817 08/23/20 0426  WBC 24.6* 19.4*  HGB 11.4* 10.1*  HCT 31.3* 28.0*  PLT 30* 33*   BMET Recent Labs    08/22/20 1817 08/23/20 0426  NA 124* 125*  K 4.4 3.7  CL 88* 92*  CO2 20* 17*  GLUCOSE 148* 96  BUN 120* 131*  CREATININE 3.44* 3.41*  CALCIUM 8.2* 7.4*   LFT Recent Labs    08/23/20 0426  PROT 6.4*  ALBUMIN 2.3*  AST 195*  ALT 59*  ALKPHOS 194*  BILITOT 8.9*  BILIDIR 6.2*  IBILI 2.7*   PT/INR Recent Labs    08/22/20 1817  LABPROT 16.4*  INR 1.4*     Studies/Results: DG Chest 2 View  Result Date: 08/22/2020 CLINICAL DATA:  Elevated white blood cell count EXAM:  CHEST - 2 VIEW COMPARISON:  None. FINDINGS: Cardiac shadow is mildly enlarged but accentuated by the frontal technique. The lungs are well aerated bilaterally. No focal infiltrate or effusion is seen. No bony abnormality is noted. IMPRESSION: No acute abnormality noted. Electronically Signed   By: Alcide Clever  M.D.   On: 08/22/2020 19:35   US RENAL  Result Date: 08/23/2020 CLINICAL DATA:  Acute renal injury EXAM: RENAL / URINARY TRACT ULTRASOUND COMPLETE COMPARISON:  None. FINDINGS: Right Kidney: Renal measurements: 13.1 x 5.1 x 6.0 cm. = volume: 208 mL. Echogenicity within normal limits. No mass or hydronephrosis visualized. Left Kidney: Renal measurements: 14.4 x 5.7 x 6.3 cm. = volume: 272 mL. Echogenicity within normal limits. No mass or hydronephrosis visualized. Bladder: Appears normal for degree of bladder distention. Other: Increased echogenicity in the liver is noted consistent with fatty infiltration. Spleen is mildly prominent but maintains its spleniform shape. IMPRESSION: No renal abnormality is noted. Changes of fatty liver and mild splenomegaly. Electronically Signed   By: Alcide Clever M.D.   On: 08/23/2020 00:13   US Abdomen Limited  Result Date: 08/22/2020 CLINICAL DATA:  Jaundice.  Concern for liver cirrhosis. EXAM: ULTRASOUND ABDOMEN LIMITED RIGHT UPPER QUADRANT COMPARISON:  None. FINDINGS: Gallbladder: Physiologically distended. No gallstones or wall thickening visualized. No sonographic Murphy sign noted by sonographer. Common bile duct: Diameter: 3 mm, not well-defined. Liver: Diffusely increased parenchymal echogenicity, liver parenchyma is difficult to penetrate. No discrete focal hepatic lesion. No definite capsular nodularity. Portal vein is patent on color Doppler imaging with normal direction of blood flow towards the liver. Other: No right upper quadrant ascites. There are bilateral pleural effusions. Technically challenging exam due to habitus and patient's limited ability to hold breath. IMPRESSION: 1. Diffusely increased hepatic parenchymal echogenicity consistent with steatosis or other intrinsic hepatocellular disease. No capsular nodularity or other findings of cirrhosis. 2. Normal sonographic appearance of the gallbladder and biliary tree. 3. Pleural effusions are noted. There is  no right upper quadrant ascites. Electronically Signed   By: Narda Rutherford M.D.   On: 08/22/2020 18:54    Impression: Abnormal LFTs: alcoholic hepatitis vs. COVID-19 related vs. Medication-induced (tylenol) vs.  Cirrhosis, though cirrhosis not seen on ultrasound.  No biliary abnormalities on [place]. Hepatic discriminant function <32 as of 08/22/20. -T. Bili 8.9/ AST 195/ ALT 59/ ALP 194, gradually improving -INR 1.4 as of 1/31 -Acute hepatitis panel negative  Thrombocytopenia, platelets 33K/uL today.  Patient having nosebleeds.  Suspect episode of hematemesis may have been vomiting of digested blood from the nasopharynx.  No melena or other signs of GI bleeding at this time.  AKI: BUN 131/ Cr 3.41  COVID-19 infection, on Remdesivir  Plan: Transfuse 2u platelets.  Further platelet or FFP transfusions to be managed by hospitalist.  Continue to monitor CBC (H&H, platelets) with transfusion as needed to maintain Hgb >7.  Suspect bleeding is from oropharynx and due to thrombocytopenia.  However, due to thrombocytopenia, prior NSAID use, and hematemesis x1, Protonix IV drip for 72h, followed by 40 mg IV BID for 1-2 months.  Unless overt GI bleeding occurs, we will hold off on EGD during admission.  Continue to trend LFTs.  Continue supportive care.  [facility] will sign off. Please contact us if we can be of any further assistance during this hospital stay.   LOS: 1 day   Edrick Kins  PA-C 08/23/2020, 11:14 AM  Contact #  [phone]

## 2020-08-23 NOTE — ED Notes (Signed)
Blood bank has platelets ready for this patient. Notified Chelsea,RN.

## 2020-08-24 LAB — COMPREHENSIVE METABOLIC PANEL
ALT: 51 U/L — ABNORMAL HIGH (ref 0–44)
AST: 172 U/L — ABNORMAL HIGH (ref 15–41)
Albumin: 2.5 g/dL — ABNORMAL LOW (ref 3.5–5.0)
Alkaline Phosphatase: 261 U/L — ABNORMAL HIGH (ref 38–126)
Anion gap: 18 — ABNORMAL HIGH (ref 5–15)
BUN: 134 mg/dL — ABNORMAL HIGH (ref 6–20)
CO2: 16 mmol/L — ABNORMAL LOW (ref 22–32)
Calcium: 7.4 mg/dL — ABNORMAL LOW (ref 8.9–10.3)
Chloride: 92 mmol/L — ABNORMAL LOW (ref 98–111)
Creatinine, Ser: 3.57 mg/dL — ABNORMAL HIGH (ref 0.61–1.24)
GFR, Estimated: 22 mL/min — ABNORMAL LOW (ref >60)
Glucose, Bld: 102 mg/dL — ABNORMAL HIGH (ref 70–99)
Potassium: 4.2 mmol/L (ref 3.5–5.1)
Sodium: 126 mmol/L — ABNORMAL LOW (ref 135–145)
Total Bilirubin: 10.2 mg/dL — ABNORMAL HIGH (ref 0.3–1.2)
Total Protein: 6.4 g/dL — ABNORMAL LOW (ref 6.5–8.1)

## 2020-08-24 LAB — CBC WITH DIFFERENTIAL/PLATELET
Abs Immature Granulocytes: 1.77 10*3/uL — ABNORMAL HIGH (ref 0.00–0.07)
Basophils Absolute: 0.1 10*3/uL (ref 0.0–0.1)
Basophils Relative: 0 %
Eosinophils Absolute: 0.1 10*3/uL (ref 0.0–0.5)
Eosinophils Relative: 0 %
HCT: 24.9 % — ABNORMAL LOW (ref 39.0–52.0)
Hemoglobin: 9.1 g/dL — ABNORMAL LOW (ref 13.0–17.0)
Immature Granulocytes: 8 %
Lymphocytes Relative: 5 %
Lymphs Abs: 1.2 10*3/uL (ref 0.7–4.0)
MCH: 35.8 pg — ABNORMAL HIGH (ref 26.0–34.0)
MCHC: 36.5 g/dL — ABNORMAL HIGH (ref 30.0–36.0)
MCV: 98 fL (ref 80.0–100.0)
Monocytes Absolute: 1 10*3/uL (ref 0.1–1.0)
Monocytes Relative: 5 %
Neutro Abs: 18.4 10*3/uL — ABNORMAL HIGH (ref 1.7–7.7)
Neutrophils Relative %: 82 %
Platelets: 57 10*3/uL — ABNORMAL LOW (ref 150–400)
RBC: 2.54 MIL/uL — ABNORMAL LOW (ref 4.22–5.81)
RDW: 14.6 % (ref 11.5–15.5)
WBC: 22.5 10*3/uL — ABNORMAL HIGH (ref 4.0–10.5)
nRBC: 0 % (ref 0.0–0.2)

## 2020-08-24 LAB — MAGNESIUM: Magnesium: 2.5 mg/dL — ABNORMAL HIGH (ref 1.7–2.4)

## 2020-08-24 LAB — D-DIMER, QUANTITATIVE: D-Dimer, Quant: 3.79 ug/mL-FEU — ABNORMAL HIGH (ref 0.00–0.50)

## 2020-08-24 LAB — FERRITIN: Ferritin: 1186 ng/mL — ABNORMAL HIGH (ref 24–336)

## 2020-08-24 LAB — HAPTOGLOBIN: Haptoglobin: 319 mg/dL — ABNORMAL HIGH (ref 17–317)

## 2020-08-24 LAB — C-REACTIVE PROTEIN: CRP: 26.1 mg/dL — ABNORMAL HIGH (ref <1.0)

## 2020-08-24 MED ORDER — METHYLPREDNISOLONE SODIUM SUCC 125 MG IJ SOLR
60.0000 mg | Freq: Two times a day (BID) | INTRAMUSCULAR | Status: DC
  Administered 2020-08-24 – 2020-08-29 (×11): 60 mg via INTRAVENOUS
  Filled 2020-08-24 (×11): qty 2

## 2020-08-24 MED ORDER — IPRATROPIUM-ALBUTEROL 20-100 MCG/ACT IN AERS
1.0000 | INHALATION_SPRAY | Freq: Four times a day (QID) | RESPIRATORY_TRACT | Status: DC | PRN
  Administered 2020-08-25: 1 via RESPIRATORY_TRACT
  Filled 2020-08-24: qty 4

## 2020-08-24 MED ORDER — GUAIFENESIN-DM 100-10 MG/5ML PO SYRP
5.0000 mL | ORAL_SOLUTION | ORAL | Status: DC | PRN
  Administered 2020-08-25 – 2020-08-29 (×3): 5 mL via ORAL
  Filled 2020-08-24 (×3): qty 10

## 2020-08-24 MED ORDER — SODIUM CHLORIDE 0.9 % IV SOLN: INTRAVENOUS | Status: AC

## 2020-08-24 MED ORDER — HYDROCORTISONE 1 % EX CREA
1.0000 "application " | TOPICAL_CREAM | Freq: Three times a day (TID) | CUTANEOUS | Status: DC | PRN
  Administered 2020-08-24: 1 via TOPICAL
  Filled 2020-08-24: qty 28

## 2020-08-24 NOTE — Progress Notes (Signed)
Patient ID: BALRAJ BRAYFIELD, male   DOB: [birth year], 35 y.o.   MRN: [id number] S: No events overnight and starting to make more urine. O:BP 126/77 (BP Location: Left Arm)   Pulse 93   Temp 97.8 F (36.6 C) (Oral)   Resp (!) 22   Wt 92.1 kg   SpO2 97%   BMI 29.13 kg/m   Intake/Output Summary (Last 24 hours) at 08/24/2020 1301 Last data filed at 08/24/2020 1200 Gross per 24 hour  Intake 2437.64 ml  Output 700 ml  Net 1737.64 ml   Intake/Output: I/O last 3 completed shifts: In: 4237.6 [I.V.:1145.4; Blood:700.6; IV Piggyback:2391.6] Out: 200 [Urine:200]  Intake/Output this shift:  Total I/O In: 200 [IV Piggyback:200] Out: 500 [Urine:500] Weight change:  Physical exam: unable to complete due to COVID + status.  In order to preserve PPE equipment and to minimize exposure to providers.  Notes from other caregivers reviewed   Recent Labs  Lab 08/22/20 1817 08/23/20 0426 08/23/20 1500 08/24/20 0420  NA 124* 125* 126* 126*  K 4.4 3.7 3.8 4.2  CL 88* 92* 94* 92*  CO2 20* 17* 14* 16*  GLUCOSE 148* 96 124* 102*  BUN 120* 131* 120* 134*  CREATININE 3.44* 3.41* 3.26* 3.57*  ALBUMIN 2.3* 2.3*  --  2.5*  CALCIUM 8.2* 7.4* 7.0* 7.4*  AST 271* 195*  --  172*  ALT 76* 59*  --  51*   Liver Function Tests: Recent Labs  Lab 08/22/20 1817 08/23/20 0426 08/24/20 0420  AST 271* 195* 172*  ALT 76* 59* 51*  ALKPHOS 212* 194* 261*  BILITOT 9.6* 8.9* 10.2*  PROT 7.3 6.4* 6.4*  ALBUMIN 2.3* 2.3* 2.5*   Recent Labs  Lab 08/23/20 1500  LIPASE 99*   Recent Labs  Lab 08/22/20 1817  AMMONIA 44*   CBC: Recent Labs  Lab 08/22/20 1817 08/23/20 0426 08/23/20 1103 08/23/20 1259 08/24/20 0750  WBC 24.6* 19.4* 16.8* 21.5* 22.5*  NEUTROABS 21.9* 16.7* 15.5* 19.0* 18.4*  HGB 11.4* 10.1* 10.5* 10.7* 9.1*  HCT 31.3* 28.0* 29.5* 29.4* 24.9*  MCV 98.4 97.9 98.3 97.0 98.0  PLT 30* 33* 34* 32* 57*   Cardiac Enzymes: Recent Labs  Lab 08/23/20 1500  CKTOTAL 47*   CBG: No results  for input(s): GLUCAP in the last 168 hours.  Iron Studies:  Recent Labs    08/24/20 0420  FERRITIN 1,186*   Studies/Results: DG Chest 2 View  Result Date: 08/22/2020 CLINICAL DATA:  Elevated white blood cell count EXAM: CHEST - 2 VIEW COMPARISON:  None. FINDINGS: Cardiac shadow is mildly enlarged but accentuated by the frontal technique. The lungs are well aerated bilaterally. No focal infiltrate or effusion is seen. No bony abnormality is noted. IMPRESSION: No acute abnormality noted. Electronically Signed   By: Alcide Clever M.D.   On: 08/22/2020 19:35   US RENAL  Result Date: 08/23/2020 CLINICAL DATA:  Acute renal injury EXAM: RENAL / URINARY TRACT ULTRASOUND COMPLETE COMPARISON:  None. FINDINGS: Right Kidney: Renal measurements: 13.1 x 5.1 x 6.0 cm. = volume: 208 mL. Echogenicity within normal limits. No mass or hydronephrosis visualized. Left Kidney: Renal measurements: 14.4 x 5.7 x 6.3 cm. = volume: 272 mL. Echogenicity within normal limits. No mass or hydronephrosis visualized. Bladder: Appears normal for degree of bladder distention. Other: Increased echogenicity in the liver is noted consistent with fatty infiltration. Spleen is mildly prominent but maintains its spleniform shape. IMPRESSION: No renal abnormality is noted. Changes of fatty liver and mild splenomegaly.  Electronically Signed   By: Alcide Clever M.D.   On: 08/23/2020 00:13   US Abdomen Limited  Result Date: 08/22/2020 CLINICAL DATA:  Jaundice.  Concern for liver cirrhosis. EXAM: ULTRASOUND ABDOMEN LIMITED RIGHT UPPER QUADRANT COMPARISON:  None. FINDINGS: Gallbladder: Physiologically distended. No gallstones or wall thickening visualized. No sonographic Murphy sign noted by sonographer. Common bile duct: Diameter: 3 mm, not well-defined. Liver: Diffusely increased parenchymal echogenicity, liver parenchyma is difficult to penetrate. No discrete focal hepatic lesion. No definite capsular nodularity. Portal vein is patent on color  Doppler imaging with normal direction of blood flow towards the liver. Other: No right upper quadrant ascites. There are bilateral pleural effusions. Technically challenging exam due to habitus and patient's limited ability to hold breath. IMPRESSION: 1. Diffusely increased hepatic parenchymal echogenicity consistent with steatosis or other intrinsic hepatocellular disease. No capsular nodularity or other findings of cirrhosis. 2. Normal sonographic appearance of the gallbladder and biliary tree. 3. Pleural effusions are noted. There is no right upper quadrant ascites. Electronically Signed   By: Narda Rutherford M.D.   On: 08/22/2020 18:54   . folic acid  1 mg Oral Daily  . LORazepam  0-4 mg Oral Q6H   Followed by  . LORazepam  0-4 mg Oral Q12H  . multivitamin with minerals  1 tablet Oral Daily  . [START ON 08/26/2020] pantoprazole  40 mg Intravenous Q12H  . thiamine  100 mg Oral Daily   Or  . thiamine  100 mg Intravenous Daily    BMET    Component Value Date/Time   NA 126 (L) 08/24/2020 0420   K 4.2 08/24/2020 0420   CL 92 (L) 08/24/2020 0420   CO2 16 (L) 08/24/2020 0420   GLUCOSE 102 (H) 08/24/2020 0420   BUN 134 (H) 08/24/2020 0420   CREATININE 3.57 (H) 08/24/2020 0420   CALCIUM 7.4 (L) 08/24/2020 0420   GFRNONAA 22 (L) 08/24/2020 0420   GFRAA >60 02/24/2019 1520   CBC    Component Value Date/Time   WBC 22.5 (H) 08/24/2020 0750   RBC 2.54 (L) 08/24/2020 0750   HGB 9.1 (L) 08/24/2020 0750   HCT 24.9 (L) 08/24/2020 0750   PLT 57 (L) 08/24/2020 0750   MCV 98.0 08/24/2020 0750   MCH 35.8 (H) 08/24/2020 0750   MCHC 36.5 (H) 08/24/2020 0750   RDW 14.6 08/24/2020 0750   LYMPHSABS 1.2 08/24/2020 0750   MONOABS 1.0 08/24/2020 0750   EOSABS 0.1 08/24/2020 0750   BASOSABS 0.1 08/24/2020 0750     Assessment/Plan: 1.  AKI- likely multifactorial with NSAIDs and concomitant ACE/HCTZ, poor po intake, 3rd spacing/liver disease, and covid infection.  Starting to make some urine which  is very dark and thick.  Will send urine studies for FeNa and check CK levels as well.  No indication for dialysis at this time and continue to follow UOP and SCr with IVF's and IV albumin.  Was concern for HRS which is very possible in light of GIB and abnormal LFT's.  No cirrhosis or ascites seen on [place]. 1. Continue to hold ACE/HCTZ and NSAIDs 2. Renal US without obstruction. 3. IVF's and IV albumin 4. Urine Na <10 consistent with pre-renal azotemia, or possible hepatorenal syndrome.  Continue with IVF's for now, no midodrine or octreotide due to improved BP  5. Will also order some serologies to r/o autoimmune process 6. No indication for dialysis at this time.  Unfortunately cannot perform renal biopsy due to severe thrombocytopenia. 2. Epistaxis/hematemasis - GI  following and suspect is from oropharynx due to thrombocytopenia, however may have gastritis due to NSAID use.  GI signed off, however may need EGD if bleeding persists. 3. Abnormal LFT's with elevated bilirubin- h/o daily alcohol use as well as tylenol.  Levels pending and follow LFT's 4. Thrombocytopenia- presumably due to Etoh and liver disease, however also on NSAIDs.  s/p platelet transfusion and await repeat levels. 5. covid-19 infection- pt without respiratory symptoms, no vaccine but prior infection in August 2020.  Continue with respiratory isolation.   Irena Cords, MD [employer] [phone]

## 2020-08-24 NOTE — Progress Notes (Addendum)
PROGRESS NOTE  [name] XLK:[id number] DOB: [birth year] DOA: 08/22/2020 PCP: [name], PA   LOS: 2 days   Brief narrative:  [name] is a 35 y.o. male with history of alcohol use disorder, hypertension, depression, left hip avascular necrosis on Robaxin and tramadol presented to the hospital with epistaxis and throwing up some blood 24 hours prior to presentation.   Patient had been on Voltaren as well.  In the ER , patient was initially noted to be hypotensive and received 2 L of normal saline.  Creatinine was significantly elevated at 3.4 with baseline creatinine of around 0.9.  LFTs were elevated with thrombocytopenia.  Patient also had leukocytosis.  Right upper quadrant ultrasound showed features concerning for  hepatic steatosis.  No definite evidence of cirrhosis. He tested positive for Covid infection and well. Patient was then admitted for acute renal failure with thrombocytopenia, epistaxis, acute hepatitis and Covid infection.  Patient was seen by GI during hospitalization.  Received 2 units of blood with improving bleeding.  Patient has been started on Protonix as well.  GI has signed off at this time.  Patient does have a worsening renal function at this time and nephrology on board  Assessment/Plan:  Principal Problem:   ARF (acute renal failure) (HCC) Active Problems:   Alcohol dependence (HCC)   Thrombocytopenia (HCC)   Alcoholic hepatitis   Hypertension   COVID-19 virus infection  Acute renal failure -likely multifactorial from non-NSAIDs at home including lisinopril HCTZ.  Urinary sodium was less than 10 indicating prerenal azotemia/hepatorenal syndrome.  Currently on hold.  Nephrology on board.  Renal function has not improved despite IV fluids and albumin.  We will follow nephrology recommendation.  Renal ultrasound showed no evidence of obstruction.  Nephrology has seen the patient today and recommend continuation of IV fluids.  No recommendation  for midodrine or octreotide.  Some serologies have been ordered for AKI.  Lab Results  Component Value Date   CREATININE 3.57 (H) 08/24/2020   CREATININE 3.26 (H) 08/23/2020   CREATININE 3.41 (H) 08/23/2020    Severe thrombocytopenia with epistaxis could be from alcoholism, Covid infection.  Hepatitis panel was negative.  HIV was nonreactive.  LDH slightly elevated.  Haptoglobin elevated so no indication for hemolysis.  Status post transfusion of platelets 2 units on 08/23/2020.  No mention of further ongoing bleeding.  Would consider FFP if needed.    Hyponatremia.  Sodium of 126 today.  Could be secondary to liver disease. Continue to monitor closely.  Urinary sodium less than 10.  Nephrology on board.  COVID-19 infection acute hypoxic respiratory failure.  No infiltrates.  Pulse ox was 90% on room air.  On remdesivir.  Trend inflammatory markers.  Check LFTs closely on remdesivir.  LFT has slightly trended down.  Significantly elevated inflammatory markers.  Currently on 2 L of oxygen by nasal cannula.  Will consider steroids at this time.  COVID-19 Labs  Recent Labs    08/22/20 2224 08/22/20 2225 08/23/20 0426 08/24/20 0420  DDIMER  --  2.86* 2.78* 3.79*  FERRITIN  --   --   --  1,186*  LDH 240*  --   --   --   CRP  --  29.4* 26.8* 26.1*    Lab Results  Component Value Date   SARSCOV2NAA POSITIVE (A) 08/22/2020   SARSCOV2NAA DETECTED (A) 02/24/2019   Alcoholic hepatitis with jaundice -discriminant function score is around 25.2.    Seen by GI.  No indication for  steroids at this time.  Hepatitis panel/HIV negative at this time.   GI has signed off.  INR of 1.4.  No encephalopathy this time.  Will provide supportive care.  Bilirubin trending up.  Will check INR in a.m.  Acute blood loss anemia secondary to epistaxis/hematemesis.    Platelet count of 57,000 today after 2 units of platelets.  No mention of further bleeding.  If continued oral/nasal bleeding, could consider ENT  consultation.  Was taking aspirin as outpatient.  Continue to hold.  Continue Protonix.  Patient was advised against NSAIDs.  Will need to monitor platelets closely.  Hemoglobin of 9.1 today.  Slight drop from yesterday.  We will continue to monitor closely.  Transfuse PRBC if needed.  Leukocytosis likely reactive.  Low-grade fever today.  On Rocephin for prophylaxis.   Will closely monitor.  Urinalysis without any evidence of infection.  Chest x-ray without any infiltrate.  CBC Latest Ref Rng & Units 08/24/2020 08/23/2020 08/23/2020  WBC 4.0 - 10.5 K/uL 22.5(H) 21.5(H) 16.8(H)  Hemoglobin 13.0 - 17.0 g/dL 5.7(S) 10.7(L) 10.5(L)  Hematocrit 39.0 - 52.0 % 24.9(L) 29.4(L) 29.5(L)  Platelets 150 - 400 K/uL 57(L) 32(L) 34(L)    History of hypertension blood pressure seems to be stable. Lisinopril and hydrochlorothiazide on hold due to AKI.  Ongoing alcohol use disorder.  Patient has significantly cut down on his alcohol and drinks around 1-2 beers a day.  On  CIWA protocol.  Required Ativan IV this morning  Left hip pain secondary to avascular necrosis per the patient.  Patient was supposed to have surgical intervention done as outpatient.  Avoid NSAIDs if possible  DVT prophylaxis: SCDs Start: 08/22/20 2135.  Code Status: Full code  Family Communication:  Spoke with the patient's significant other on the phone and updated her about the clinical condition of the patient.  Overall prognosis with the patient is guarded at this time.  Status is: Inpatient  Remains inpatient appropriate because:IV treatments appropriate due to intensity of illness or inability to take PO, Inpatient level of care appropriate due to severity of illness, renal   Dispo: The patient is from: Home              Anticipated d/c is to: Home              Anticipated d/c date is: 3 days or more              Patient currently is not medically stable to d/c.   Difficult to place patient  No  Consultants: Nephrology GI  Procedures: Platelet transfusion 2 units on 08/23/2020  Anti-infectives:  Rocephin IV 2/1> Remdesivir 2/1>  Anti-infectives (From admission, onward)    Start     Dose/Rate Route Frequency Ordered Stop   08/24/20 1000  remdesivir 100 mg in sodium chloride 0.9 % 100 mL IVPB       "Followed by" Linked Group Details   100 mg 200 mL/hr over 30 Minutes Intravenous Daily 08/23/20 0101 08/26/20 0959   08/23/20 0600  cefTRIAXone (ROCEPHIN) 2 g in sodium chloride 0.9 % 100 mL IVPB        2 g 200 mL/hr over 30 Minutes Intravenous Every 24 hours 08/23/20 0529     08/23/20 0130  remdesivir 200 mg in sodium chloride 0.9% 250 mL IVPB       "Followed by" Linked Group Details   200 mg 580 mL/hr over 30 Minutes Intravenous Once 08/23/20 0101 08/23/20 0150  Subjective: Today, patient was seen and examined at bedside.  Patient was mildly agitated this morning and required Ativan.  Was communicated to me.  Complains of hip pain.  Denies further bleeding.  Denies any nausea vomiting or abdominal pain.  Denies shortness of breath cough but had mild fever. Objective: Vitals:   08/24/20 0503 08/24/20 0534  BP: 129/67 120/74  Pulse: 98 96  Resp: (!) 26 (!) 27  Temp: 98.6 F (37 C) 98.8 F (37.1 C)  SpO2: 96% 98%    Intake/Output Summary (Last 24 hours) at 08/24/2020 0839 Last data filed at 08/24/2020 0503 Gross per 24 hour  Intake 2237.64 ml  Output 200 ml  Net 2037.64 ml   Filed Weights   08/24/20 0534  Weight: 92.1 kg   Body mass index is 29.13 kg/m.   Physical Exam:  GENERAL: Patient is alert awake and oriented. Not in obvious distress. HENT: Mild pallor noted, icterus noted. Pupils equally reactive to light. Oral mucosa with dried blood-tinged palatal region and gum area.  Bilateral subconjunctival hemorrhage noted. NECK: is supple, no gross swelling noted. CHEST: . No crackles or wheezes.  Diminished breath sounds bilaterally. CVS: S1 and S2  heard, no murmur. Regular rate and rhythm.  ABDOMEN: Soft, nontender, bowel sounds are present. EXTREMITIES: Bilateral lower extremity mild edema. CNS: Cranial nerves are intact. No focal motor deficits. SKIN: warm and dry without rashes.  Icteric.  Data Review: I have personally reviewed the following laboratory data and studies,  CBC: Recent Labs  Lab 08/22/20 1817 08/23/20 0426 08/23/20 1103 08/23/20 1259 08/24/20 0750  WBC 24.6* 19.4* 16.8* 21.5* 22.5*  NEUTROABS 21.9* 16.7* 15.5* 19.0* 18.4*  HGB 11.4* 10.1* 10.5* 10.7* 9.1*  HCT 31.3* 28.0* 29.5* 29.4* 24.9*  MCV 98.4 97.9 98.3 97.0 98.0  PLT 30* 33* 34* 32* 57*   Basic Metabolic Panel: Recent Labs  Lab 08/22/20 1817 08/23/20 0426 08/23/20 1500 08/24/20 0420  NA 124* 125* 126* 126*  K 4.4 3.7 3.8 4.2  CL 88* 92* 94* 92*  CO2 20* 17* 14* 16*  GLUCOSE 148* 96 124* 102*  BUN 120* 131* 120* 134*  CREATININE 3.44* 3.41* 3.26* 3.57*  CALCIUM 8.2* 7.4* 7.0* 7.4*  MG  --   --   --  2.5*   Liver Function Tests: Recent Labs  Lab 08/22/20 1817 08/23/20 0426 08/24/20 0420  AST 271* 195* 172*  ALT 76* 59* 51*  ALKPHOS 212* 194* 261*  BILITOT 9.6* 8.9* 10.2*  PROT 7.3 6.4* 6.4*  ALBUMIN 2.3* 2.3* 2.5*   Recent Labs  Lab 08/23/20 1500  LIPASE 99*   Recent Labs  Lab 08/22/20 1817  AMMONIA 44*   Cardiac Enzymes: Recent Labs  Lab 08/23/20 1500  CKTOTAL 47*   BNP (last 3 results) No results for input(s): BNP in the last 8760 hours.  ProBNP (last 3 results) No results for input(s): PROBNP in the last 8760 hours.  CBG: No results for input(s): GLUCAP in the last 168 hours. Recent Results (from the past 240 hour(s))  SARS Coronavirus 2 by RT PCR (hospital order, performed in [facility] hospital lab) Nasopharyngeal Nasopharyngeal Swab     Status: Abnormal   Collection Time: 08/22/20  7:18 PM   Specimen: Nasopharyngeal Swab  Result Value Ref Range Status   SARS Coronavirus 2 POSITIVE (A) NEGATIVE Final     Comment: RESULT CALLED TO, READ BACK BY AND VERIFIED WITH: ZULETA K. 1.31.22 @ 2100 BY MECIAL J. (NOTE) SARS-CoV-2 target nucleic acids  are DETECTED  SARS-CoV-2 RNA is generally detectable in upper respiratory specimens  during the acute phase of infection.  Positive results are indicative  of the presence of the identified virus, but do not rule out bacterial infection or co-infection with other pathogens not detected by the test.  Clinical correlation with patient history and  other diagnostic information is necessary to determine patient infection status.  The expected result is negative.  Fact Sheet for Patients:   BoilerBrush.com.cy   Fact Sheet for Healthcare Providers:   https://pope.com/    This test is not yet approved or cleared by the [place] FDA and  has been authorized for detection and/or diagnosis of SARS-CoV-2 by FDA under an Emergency Use Authorization (EUA).  This EUA will remain in effect (meaning this  test can be used) for the duration of  the COVID-19 declaration under Section 564(b)(1) of the Act, 21 U.S.C. section 360-bbb-3(b)(1), unless the authorization is terminated or revoked sooner.  Performed at [facility], [address] [place], [place] [address]      Studies: DG Chest 2 View  Result Date: 08/22/2020 CLINICAL DATA:  Elevated white blood cell count EXAM: CHEST - 2 VIEW COMPARISON:  None. FINDINGS: Cardiac shadow is mildly enlarged but accentuated by the frontal technique. The lungs are well aerated bilaterally. No focal infiltrate or effusion is seen. No bony abnormality is noted. IMPRESSION: No acute abnormality noted. Electronically Signed   By: Alcide Clever M.D.   On: 08/22/2020 19:35   US RENAL  Result Date: 08/23/2020 CLINICAL DATA:  Acute renal injury EXAM: RENAL / URINARY TRACT ULTRASOUND COMPLETE COMPARISON:  None. FINDINGS: Right Kidney: Renal measurements: 13.1  x 5.1 x 6.0 cm. = volume: 208 mL. Echogenicity within normal limits. No mass or hydronephrosis visualized. Left Kidney: Renal measurements: 14.4 x 5.7 x 6.3 cm. = volume: 272 mL. Echogenicity within normal limits. No mass or hydronephrosis visualized. Bladder: Appears normal for degree of bladder distention. Other: Increased echogenicity in the liver is noted consistent with fatty infiltration. Spleen is mildly prominent but maintains its spleniform shape. IMPRESSION: No renal abnormality is noted. Changes of fatty liver and mild splenomegaly. Electronically Signed   By: Alcide Clever M.D.   On: 08/23/2020 00:13   US Abdomen Limited  Result Date: 08/22/2020 CLINICAL DATA:  Jaundice.  Concern for liver cirrhosis. EXAM: ULTRASOUND ABDOMEN LIMITED RIGHT UPPER QUADRANT COMPARISON:  None. FINDINGS: Gallbladder: Physiologically distended. No gallstones or wall thickening visualized. No sonographic Murphy sign noted by sonographer. Common bile duct: Diameter: 3 mm, not well-defined. Liver: Diffusely increased parenchymal echogenicity, liver parenchyma is difficult to penetrate. No discrete focal hepatic lesion. No definite capsular nodularity. Portal vein is patent on color Doppler imaging with normal direction of blood flow towards the liver. Other: No right upper quadrant ascites. There are bilateral pleural effusions. Technically challenging exam due to habitus and patient's limited ability to hold breath. IMPRESSION: 1. Diffusely increased hepatic parenchymal echogenicity consistent with steatosis or other intrinsic hepatocellular disease. No capsular nodularity or other findings of cirrhosis. 2. Normal sonographic appearance of the gallbladder and biliary tree. 3. Pleural effusions are noted. There is no right upper quadrant ascites. Electronically Signed   By: Narda Rutherford M.D.   On: 08/22/2020 18:54      Joycelyn Das, MD  [facility] 08/24/2020  If 7PM-7AM, please contact  night-coverage

## 2020-08-24 NOTE — Progress Notes (Signed)
   08/24/20 0155  Assess: MEWS Score  Temp 99.4 F (37.4 C)  BP (!) 106/46  Pulse Rate (!) 101  Resp (!) 27  SpO2 95 %  O2 Device Nasal Cannula  Patient Activity (if Appropriate) In bed  O2 Flow Rate (L/min) 2 L/min  Assess: MEWS Score  MEWS Temp 0  MEWS Systolic 0  MEWS Pulse 1  MEWS RR 2  MEWS LOC 0  MEWS Score 3  MEWS Score Color Yellow  Assess: if the MEWS score is Yellow or Red  Were vital signs taken at a resting state? Yes  Focused Assessment No change from prior assessment  Early Detection of Sepsis Score *See Row Information* High  MEWS guidelines implemented *See Row Information* No, previously red, continue vital signs every 4 hours  Notify: Charge Nurse/RN  Name of Charge Nurse/RN Notified Renee RN  Date Charge Nurse/RN Notified 08/24/20  Time Charge Nurse/RN Notified 0200  Document  Patient Outcome Other (Comment) (remain on the unit)  Progress note created (see row info) Yes   Patient remain Yellow MEWS and on the unit being monitored. Will continue to follow previous MEWS guidelines.

## 2020-08-24 NOTE — Progress Notes (Signed)
   08/23/20 2300  Assess: MEWS Score  Temp 100.3 F (37.9 C)  BP 121/72  Resp (!) 32  Level of Consciousness Alert  SpO2 95 %  O2 Device Nasal Cannula  Patient Activity (if Appropriate) In bed  O2 Flow Rate (L/min) 2 L/min  Assess: MEWS Score  MEWS Temp 0  MEWS Systolic 0  MEWS Pulse 2  MEWS RR 2  MEWS LOC 0  MEWS Score 4  MEWS Score Color Red  Assess: if the MEWS score is Yellow or Red  Were vital signs taken at a resting state? Yes  Focused Assessment No change from prior assessment  Early Detection of Sepsis Score *See Row Information* High  MEWS guidelines implemented *See Row Information* Yes  Treat  MEWS Interventions Administered prn meds/treatments  Pain Scale 0-10  Pain Score 0  Take Vital Signs  Increase Vital Sign Frequency  Red: Q 1hr X 4 then Q 4hr X 4, if remains red, continue Q 4hrs  Escalate  MEWS: Escalate Red: discuss with charge nurse/RN and provider, consider discussing with RRT  Notify: Charge Nurse/RN  Name of Charge Nurse/RN Notified Animator (at bedside with this nurse)  Date Charge Nurse/RN Notified 08/23/20  Time Charge Nurse/RN Notified 2300  Notify: Provider  Provider Name/Title X.Blount  Date Provider Notified 08/23/20  Time Provider Notified 2309  Notification Type Page  Notification Reason Change in status  Response See new orders  Date of Provider Response 08/23/20  Time of Provider Response 2337  Document  Patient Outcome Other (Comment) (remain on unit)  Progress note created (see row info) Yes   Patient remain red MEWS with elevated temp, respirations, and HR. Charge nurse at bedside with this nurse. CIWA score 8. On-call attending notified via AMION and secure chat to inform of patients elevated temp and patient has 1 unit of platelets to left to give. PRN meds administered and new order to administer Tylenol. No response from the on-call attending with instructions for platelets transfusion. Patient is not alert at this moment to  sign consent to administer the platelets and temp remain elevated with some improvement. Will continue red MEWS protocol and will monitor the patient.

## 2020-08-24 NOTE — Progress Notes (Signed)
   08/23/20 2356  Assess: MEWS Score  Temp 99.4 F (37.4 C)  BP 118/63  Pulse Rate (!) 112  Resp (!) 26  SpO2 96 %  O2 Device Nasal Cannula  O2 Flow Rate (L/min) 2 L/min  Assess: MEWS Score  MEWS Temp 0  MEWS Systolic 0  MEWS Pulse 2  MEWS RR 2  MEWS LOC 0  MEWS Score 4  MEWS Score Color Red  Assess: if the MEWS score is Yellow or Red  Were vital signs taken at a resting state? Yes  Focused Assessment No change from prior assessment  Early Detection of Sepsis Score *See Row Information* High  MEWS guidelines implemented *See Row Information* Yes  Treat  MEWS Interventions Administered prn meds/treatments  Escalate  MEWS: Escalate Red: discuss with charge nurse/RN and provider, consider discussing with RRT  Notify: Charge Nurse/RN  Name of Charge Nurse/RN Notified Renee RN  Date Charge Nurse/RN Notified 08/24/20  Time Charge Nurse/RN Notified 0005  Document  Patient Outcome Other (Comment) (remain on unit)  Progress note created (see row info) Yes   Patient remain on the unit with VS improving with previous interventions. Patient is not alert to sign consent to infuse 1 unit of platelets. Will continue to monitor the patient and continue red MEWS guidelines.

## 2020-08-24 NOTE — Progress Notes (Signed)
   08/24/20 0534  Assess: MEWS Score  Temp 98.8 F (37.1 C)  BP 120/74  Pulse Rate 96  Resp (!) 27  SpO2 98 %  O2 Device Nasal Cannula  Patient Activity (if Appropriate) In bed  O2 Flow Rate (L/min) 2 L/min  Assess: MEWS Score  MEWS Temp 0  MEWS Systolic 0  MEWS Pulse 0  MEWS RR 2  MEWS LOC 0  MEWS Score 2  MEWS Score Color Yellow  Assess: if the MEWS score is Yellow or Red  Were vital signs taken at a resting state? Yes  Focused Assessment No change from prior assessment  Early Detection of Sepsis Score *See Row Information* High  MEWS guidelines implemented *See Row Information* No, previously yellow, continue vital signs every 4 hours  Treat  MEWS Interventions Administered prn meds/treatments  Neuro symptoms relieved by Anti-anxiety medication  Notify: Charge Nurse/RN  Name of Charge Nurse/RN Notified Renee RN  Date Charge Nurse/RN Notified 08/24/20  Time Charge Nurse/RN Notified [phone]  Document  Patient Outcome Not stable and remains on department (remain on the unit)  Progress note created (see row info) Yes   Patient remain on the unit in the yellow MEWS, protocol to continue with monitoring.

## 2020-08-24 NOTE — Progress Notes (Signed)
   08/23/20 2159  Assess: MEWS Score  Temp 98.2 F (36.8 C)  BP (!) 161/76  Pulse Rate (!) 121  Resp (!) 30  Level of Consciousness Alert  SpO2 100 %  O2 Device Room Air  Patient Activity (if Appropriate) In bed  Assess: MEWS Score  MEWS Temp 0  MEWS Systolic 0  MEWS Pulse 2  MEWS RR 2  MEWS LOC 0  MEWS Score 4  MEWS Score Color Red  Assess: if the MEWS score is Yellow or Red  Were vital signs taken at a resting state? Yes  Early Detection of Sepsis Score *See Row Information* High  MEWS guidelines implemented *See Row Information* Yes  Treat  MEWS Interventions Administered prn meds/treatments;Administered scheduled meds/treatments;Escalated (See documentation below)  Pain Scale 0-10  Pain Score 0  Neuro symptoms relieved by Anti-anxiety medication  Take Vital Signs  Increase Vital Sign Frequency  Red: Q 1hr X 4 then Q 4hr X 4, if remains red, continue Q 4hrs  Escalate  MEWS: Escalate Red: discuss with charge nurse/RN and provider, consider discussing with RRT  Notify: Charge Nurse/RN  Name of Charge Nurse/RN Notified Renee RN  Date Charge Nurse/RN Notified 08/23/20  Time Charge Nurse/RN Notified 2205  Notify: Provider  Provider Name/Title X.Blount  Date Provider Notified 08/23/20  Time Provider Notified 2245  Notification Type Page  Notification Reason Change in status (Red MEWS elevated HR and Resp)  Response Other (Comment) (waiting for response)  Document  Patient Outcome Other (Comment) (remain on unit)  Progress note created (see row info) Yes   Patient is red MEWS with elevated HR and Respirations. CIWA score 11. Patient is anxious, restless, and using accessory muscles for breathing. Bradly Chris' CN notified and at patients bedside with this nurse. Patient put on 2L O2 for comfort. Red MEWS protocol initiated and will continue to monitor the patient.

## 2020-08-24 NOTE — Progress Notes (Signed)
   08/24/20 0102  Assess: MEWS Score  Temp 99.5 F (37.5 C)  BP 123/68  Pulse Rate (!) 107  Resp (!) 30  SpO2 95 %  O2 Device Nasal Cannula  Patient Activity (if Appropriate) In bed  O2 Flow Rate (L/min) 2 L/min  Assess: MEWS Score  MEWS Temp 0  MEWS Systolic 0  MEWS Pulse 1  MEWS RR 2  MEWS LOC 0  MEWS Score 3  MEWS Score Color Yellow  Assess: if the MEWS score is Yellow or Red  Were vital signs taken at a resting state? Yes  Focused Assessment No change from prior assessment  Early Detection of Sepsis Score *See Row Information* High  MEWS guidelines implemented *See Row Information* No, previously red, continue vital signs every 4 hours  Treat  Pain Scale 0-10  Pain Score 0  Notify: Charge Nurse/RN  Name of Charge Nurse/RN Notified Renee RN  Date Charge Nurse/RN Notified 08/24/20  Time Charge Nurse/RN Notified 0105  Document  Patient Outcome Other (Comment) (remain on the unit)  Progress note created (see row info) Yes   Patient remain on the unit and being monitored. Yellow MEWS, will continue following previous MEWS guidelines.

## 2020-08-24 NOTE — Plan of Care (Signed)
Poc discussed with pt 

## 2020-08-25 ENCOUNTER — Inpatient Hospital Stay (HOSPITAL_COMMUNITY): Payer: 59

## 2020-08-25 DIAGNOSIS — R0600 Dyspnea, unspecified: Secondary | ICD-10-CM

## 2020-08-25 LAB — PREPARE PLATELET PHERESIS
Unit division: 0
Unit division: 0
Unit division: 0

## 2020-08-25 LAB — BPAM PLATELET PHERESIS
Blood Product Expiration Date: 202202022359
Blood Product Expiration Date: 202202042359
Blood Product Expiration Date: 202202042359
ISSUE DATE / TIME: 202202011520
ISSUE DATE / TIME: 202202020244
Unit Type and Rh: 5100
Unit Type and Rh: 5100
Unit Type and Rh: 7300

## 2020-08-25 LAB — D-DIMER, QUANTITATIVE: D-Dimer, Quant: 6.11 ug/mL-FEU — ABNORMAL HIGH (ref 0.00–0.50)

## 2020-08-25 LAB — PROTIME-INR
INR: 1.8 — ABNORMAL HIGH (ref 0.8–1.2)
Prothrombin Time: 20.2 seconds — ABNORMAL HIGH (ref 11.4–15.2)

## 2020-08-25 LAB — ANTISTREPTOLYSIN O TITER: ASO: 226 IU/mL — ABNORMAL HIGH (ref 0.0–200.0)

## 2020-08-25 LAB — COMPREHENSIVE METABOLIC PANEL
ALT: 57 U/L — ABNORMAL HIGH (ref 0–44)
AST: 193 U/L — ABNORMAL HIGH (ref 15–41)
Albumin: 2.9 g/dL — ABNORMAL LOW (ref 3.5–5.0)
Alkaline Phosphatase: 308 U/L — ABNORMAL HIGH (ref 38–126)
Anion gap: 19 — ABNORMAL HIGH (ref 5–15)
BUN: 130 mg/dL — ABNORMAL HIGH (ref 6–20)
CO2: 16 mmol/L — ABNORMAL LOW (ref 22–32)
Calcium: 7.4 mg/dL — ABNORMAL LOW (ref 8.9–10.3)
Chloride: 94 mmol/L — ABNORMAL LOW (ref 98–111)
Creatinine, Ser: 2.92 mg/dL — ABNORMAL HIGH (ref 0.61–1.24)
GFR, Estimated: 28 mL/min — ABNORMAL LOW (ref >60)
Glucose, Bld: 165 mg/dL — ABNORMAL HIGH (ref 70–99)
Potassium: 4.5 mmol/L (ref 3.5–5.1)
Sodium: 129 mmol/L — ABNORMAL LOW (ref 135–145)
Total Bilirubin: 10.7 mg/dL — ABNORMAL HIGH (ref 0.3–1.2)
Total Protein: 7 g/dL (ref 6.5–8.1)

## 2020-08-25 LAB — CBC
HCT: 26.9 % — ABNORMAL LOW (ref 39.0–52.0)
Hemoglobin: 9.8 g/dL — ABNORMAL LOW (ref 13.0–17.0)
MCH: 34.9 pg — ABNORMAL HIGH (ref 26.0–34.0)
MCHC: 36.4 g/dL — ABNORMAL HIGH (ref 30.0–36.0)
MCV: 95.7 fL (ref 80.0–100.0)
Platelets: 71 10*3/uL — ABNORMAL LOW (ref 150–400)
RBC: 2.81 MIL/uL — ABNORMAL LOW (ref 4.22–5.81)
RDW: 14.6 % (ref 11.5–15.5)
WBC: 18.6 10*3/uL — ABNORMAL HIGH (ref 4.0–10.5)
nRBC: 0 % (ref 0.0–0.2)

## 2020-08-25 LAB — MAGNESIUM: Magnesium: 3.1 mg/dL — ABNORMAL HIGH (ref 1.7–2.4)

## 2020-08-25 LAB — ECHOCARDIOGRAM LIMITED
S' Lateral: 3.9 cm
Weight: 3354.52 oz

## 2020-08-25 LAB — FERRITIN: Ferritin: 1147 ng/mL — ABNORMAL HIGH (ref 24–336)

## 2020-08-25 LAB — MPO/PR-3 (ANCA) ANTIBODIES
ANCA Proteinase 3: <3.5 U/mL (ref 0.0–3.5)
Myeloperoxidase Abs: <9 U/mL (ref 0.0–9.0)

## 2020-08-25 LAB — ANTI-DNA ANTIBODY, DOUBLE-STRANDED: ds DNA Ab: <1 IU/mL (ref 0–9)

## 2020-08-25 LAB — C-REACTIVE PROTEIN: CRP: 29.4 mg/dL — ABNORMAL HIGH (ref <1.0)

## 2020-08-25 LAB — AMMONIA: Ammonia: 68 umol/L — ABNORMAL HIGH (ref 9–35)

## 2020-08-25 LAB — PHOSPHORUS: Phosphorus: 8.7 mg/dL — ABNORMAL HIGH (ref 2.5–4.6)

## 2020-08-25 LAB — C3 COMPLEMENT: C3 Complement: 139 mg/dL (ref 82–167)

## 2020-08-25 LAB — C4 COMPLEMENT: Complement C4, Body Fluid: 13 mg/dL (ref 12–38)

## 2020-08-25 MED ORDER — PERFLUTREN LIPID MICROSPHERE
1.0000 mL | INTRAVENOUS | Status: AC | PRN
  Administered 2020-08-25: 2 mL via INTRAVENOUS
  Filled 2020-08-25: qty 10

## 2020-08-25 MED ORDER — LACTULOSE 10 GM/15ML PO SOLN
10.0000 g | Freq: Two times a day (BID) | ORAL | Status: DC
  Administered 2020-08-25 – 2020-08-29 (×9): 10 g via ORAL
  Filled 2020-08-25 (×9): qty 15

## 2020-08-25 NOTE — Progress Notes (Addendum)
2 mg of ativan given to patient this morning in accordance with CIWA and per Dr. Ailene Rud recommendation. When patient reassessed, he was much more calm, with no visible sweating or shaking.   Patient's family member requesting to video chat with pt on Ipad this morning. Family member identified as Vaughan Basta, stayed on video chat with pt all day.   This RN educated pt and Vaughan Basta regarding plan of care, medications given, importance of getting into the chair, and use of CIWA scale for withdrawal management. Pt reeducated regarding use of call bell as well.  Nursing staff assisted patient with use of urinal and 3x linen changes this shift due to soiled linens. Nursing staff also assisted patient with drinking fluids this shift. All needs of patient met with intentional rounding throughout shift.   3 mg of ativan given to patient around 1400 when patient was visibly agitated, trying to get out of bed without assistance, sweating, and shaking.  Unidentified visitor, in nursing scrubs, went into pt room without stopping at front desk first this afternoon. Pt not currently allowed visitors due to not meeting COVID visitation policy. Unidentified visitor identified as Caryl Pina, family member of patient, proceeded to call out for linens and fresh water for pt. At this time, RN noticed visitor was not wearing any PPE. Visitor stated she worked here. RN requested visitor to don PPE according to hospital policy. RN returned 10 minutes later with requested items, and observed visitor still without mask or any additional PPE. Visitor stated she was "not working Midwife, but I wore my scrubs so I would not get a hard time." this RN reported encounter to charge RN, Jarrett Soho, who recommended consulting with nursing supervisor, Wille Glaser. At this time, Joe recommended calling [facility]- regarding situation. Gabriel Earing, and this RN all agreed patient was still not allowed visitors and Caryl Pina needed to leave  immediately given this as well as the fact that she would not comply with PPE policy. [facility] left hospital after speaking with nursing supervisor.  Patient's family member, Vaughan Basta, proceeded to cry on the videochat to this RN stating many concerns, such as feeling his level of ativan for withdrawal was too high and he needed assistance with drinking fluids. RN reassured Vaughan Basta that needs of patient were being met. RN validated Linda's worry regarding patient being alone in the hospital and reminded her that the staff will treat patient well, as we do with all patients.   MD made aware of family concerns, and entire situation with patient and his family.

## 2020-08-25 NOTE — Progress Notes (Signed)
  Echocardiogram 2D Echocardiogram has been performed.  Stark Bray Swaim 08/25/2020, 3:13 PM

## 2020-08-25 NOTE — Progress Notes (Signed)
Patient ID: [name], male   DOB: 1985/11/22, 35 y.o.   MRN: [id number]  Chart reviewed and LFTs continue to rise with TB 10.7, ALP 308, AST 193, ALT 57, INR 1.8, plts 71 (32). Acute renal failure with recs per Dr. Reynolds Bowl. From a liver standpoint I think his rise in LFTs is due to alcoholic hepatitis and alcoholic steatohepatitis. I think he has alcoholic liver disease but I do NOT think he has cirrhosis although he is highly likely to develop it if he continues to drink alcohol. Total bilirubin has a delayed peak so not surprised that is continuing to rise. DF 34 but with symptomatic COVID infection would be hesitant to start steroids. Defer to primary team.  Epistaxis on admission with low platelets and doubt esophageal or gastric source for his anemia. I do not think an EGD is warranted at this time. Continue supportive care.  Will re-visit tomorrow morning.

## 2020-08-25 NOTE — Progress Notes (Signed)
   08/25/20 0734  Assess: MEWS Score  Temp (!) 97.1 F (36.2 C)  BP (!) 142/76  Pulse Rate (!) 102  Resp (!) 32  Level of Consciousness Responds to Voice  SpO2 95 %  O2 Device Nasal Cannula  O2 Flow Rate (L/min) 2 L/min  Assess: MEWS Score  MEWS Temp 0  MEWS Systolic 0  MEWS Pulse 1  MEWS RR 2  MEWS LOC 1  MEWS Score 4  MEWS Score Color Red  Assess: if the MEWS score is Yellow or Red  Were vital signs taken at a resting state? Yes  Focused Assessment No change from prior assessment  Early Detection of Sepsis Score *See Row Information* Medium  MEWS guidelines implemented *See Row Information* Yes  Treat  MEWS Interventions Administered prn meds/treatments;Administered scheduled meds/treatments  Pain Scale 0-10  Pain Score 0  Neuro symptoms relieved by Anti-anxiety medication  Take Vital Signs  Increase Vital Sign Frequency  Red: Q 1hr X 4 then Q 4hr X 4, if remains red, continue Q 4hrs  Escalate  MEWS: Escalate Red: discuss with charge nurse/RN and provider, consider discussing with RRT  Notify: Charge Nurse/RN  Name of Charge Nurse/RN Notified Christena Deem Rn  Date Charge Nurse/RN Notified 08/25/20  Time Charge Nurse/RN Notified [phone]  Notify: Provider  Provider Name/Title Dr. Tyson Babinski  Date Provider Notified 08/25/20  Time Provider Notified [phone]  Notification Type Call  Notification Reason Other (Comment) (red mews)  Response Other (Comment) (stop fluids. give ativan per ciwa score)  Date of Provider Response 08/25/20  Time of Provider Response 0740  Notify: Rapid Response  Name of Rapid Response RN Notified Sarah RN  Date Rapid Response Notified 08/25/20  Time Rapid Response Notified 0741  Document  Patient Outcome Not stable and remains on department  Progress note created (see row info) Yes

## 2020-08-25 NOTE — Progress Notes (Addendum)
PROGRESS NOTE  [name] LZJ:[id number] DOB: August 17, 1985 DOA: 08/22/2020 PCP: [name], PA   LOS: 3 days   Brief narrative:  [name] is a 35 y.o. male with history of alcohol use disorder, hypertension, depression, left hip avascular necrosis on Robaxin and tramadol presented to the hospital with epistaxis and throwing up some blood 24 hours prior to presentation.   Patient had been on Voltaren as well.  In the ER , patient was initially noted to be hypotensive and received 2 L of normal saline.  Creatinine was significantly elevated at 3.4 with baseline creatinine of around 0.9.  LFTs were elevated with thrombocytopenia.  Patient also had leukocytosis.  Right upper quadrant ultrasound showed features concerning for  hepatic steatosis.  No definite evidence of cirrhosis. He tested positive for Covid infection and well. Patient was then admitted for acute renal failure with thrombocytopenia, epistaxis, acute hepatitis and Covid infection.  Patient was seen by GI during hospitalization.  Received 2 units of platelets with improvement of bleeding.  Patient has been started on Protonix as well.  GI has signed off at this time.  Patient does have a worsening renal function at this time and nephrology on board.  Assessment/Plan:  Principal Problem:   ARF (acute renal failure) (HCC) Active Problems:   Alcohol dependence (HCC)   Thrombocytopenia (HCC)   Alcoholic hepatitis   Hypertension   COVID-19 virus infection  Acute renal failure -likely multifactorial from non-NSAIDs at home including lisinopril HCTZ.  Urinary sodium was less than 10 indicating prerenal azotemia/hepatorenal syndrome.  Received IV fluids and albumin.  Slightly downtrending creatinine today from 3.5 to 2.9.   Renal ultrasound showed no evidence of obstruction. Follow serologies  for AKI.  Communicated with nephrology regarding this.  Lab Results  Component Value Date   CREATININE 2.92 (H) 08/25/2020    CREATININE 3.57 (H) 08/24/2020   CREATININE 3.26 (H) 08/23/2020    Severe thrombocytopenia with epistaxis could be from alcoholism, Covid infection.  Hepatitis panel was negative.  HIV was nonreactive.  LDH slightly elevated.  Haptoglobin elevated so no indication for hemolysis.  Status post transfusion of platelets 2 units on 08/23/2020.  No further bleeding reported.  Will check CBC from today  Hyponatremia.  Sodium of 129 today. Likely secondary to liver disease. Continue to monitor closely.  Urinary sodium less than 10.  Nephrology on board.  Received some IV fluid yesterday.  Patient was very tachypneic this morning with peripheral edema.  Hold IV fluid for now.  COVID-19 infection acute hypoxic respiratory failure.  No infiltrates on presentation. On remdesivir.  Due to hypoxia patient has been started on Solu-Medrol yesterday.  Trend inflammatory markers.  Check LFTs closely on remdesivir.  LFT has slightly trended down.. On 2 L of oxygen by nasal cannula at this time.  We will repeat a chest x-ray today.  COVID-19 Labs  Recent Labs    08/22/20 2224 08/22/20 2225 08/23/20 0426 08/24/20 0420 08/25/20 0349  DDIMER  --    < > 2.78* 3.79* 6.11*  FERRITIN  --   --   --  1,186* 1,147*  LDH 240*  --   --   --   --   CRP  --    < > 26.8* 26.1* 29.4*   < > = values in this interval not displayed.    Lab Results  Component Value Date   SARSCOV2NAA POSITIVE (A) 08/22/2020   SARSCOV2NAA DETECTED (A) 02/24/2019   Alcoholic hepatitis with  jaundice -discriminant function score is around 25.2.    Seen by GI.  No indication for steroids at this time.  Hepatitis panel/HIV negative at this time.   GI has signed off.  INR of 1.8 today.  Rising obtained.   Will provide supportive care.  Bilirubin trending up.  Will check INR in a.m.  Communicated with Dr. Bosie Clos about it.  Acute blood loss anemia secondary to epistaxis/hematemesis.    Platelet count of 57,000 today after 2 units of platelets.   Pending CBC today.  Off aspirin at this time.  Continue Protonix. Transfuse PRBC if needed.  Leukocytosis likely reactive. On Rocephin for SBP prophylaxis.   Will closely monitor.  Urinalysis without any evidence of infection.  Chest x-ray without any infiltrate.  We will repeat x-ray today.  Has been started on IV steroids due to high inflammatory markers and hypoxia.  CBC Latest Ref Rng & Units 08/24/2020 08/23/2020 08/23/2020  WBC 4.0 - 10.5 K/uL 22.5(H) 21.5(H) 16.8(H)  Hemoglobin 13.0 - 17.0 g/dL 2.6(R) 10.7(L) 10.5(L)  Hematocrit 39.0 - 52.0 % 24.9(L) 29.4(L) 29.5(L)  Platelets 150 - 400 K/uL 57(L) 32(L) 34(L)    History of hypertension  Lisinopril and hydrochlorothiazide on hold due to AKI.  Alcohol abuse with withdrawal.  Patient has significantly cut down on his alcohol and drinks around 1-2 beers a day.  On  CIWA protocol.  As needed Ativan.  Start lactulose to ensure adequate bowel movement.  Left hip pain secondary to avascular necrosis per the patient.  Patient was supposed to have surgical intervention done as outpatient.  Avoid NSAIDs if possible.   History of sleep apnea. Holds breathe during sleep. He was supposed to have sleep study as outpatient. He has typical symptoms.   History of hypertension, palpitations.  Patient was supposed to have Holter monitor as outpatient as per the significant other. Will get Echo. Monitor closely. EKG with normal sinus rhythm.  Anxiety. Anxiety at baseline. Consider Ativan PRN  DVT prophylaxis: SCDs Start: 08/22/20 2135.  Code Status: Full code  Family Communication:   I again spoke with the patient's significant other  Ms Bonita Quin on the phone and updated her about the clinical condition of the patient.  Overall prognosis with the patient is guarded at this time.  Status is: Inpatient  Remains inpatient appropriate because:IV treatments appropriate due to intensity of illness or inability to take PO, Inpatient level of care appropriate due  to severity of illness, renal failure, hypoxic respiratory failure  Dispo: The patient is from: Home              Anticipated d/c is to: Home              Anticipated d/c date is: 3 days or more              Patient currently is not medically stable to d/c.   Difficult to place patient No  Consultants: Nephrology GI  Procedures: Platelet transfusion 2 units on 08/23/2020  Anti-infectives:  Rocephin IV 2/1> Remdesivir 2/1>  Anti-infectives (From admission, onward)    Start     Dose/Rate Route Frequency Ordered Stop   08/24/20 1000  remdesivir 100 mg in sodium chloride 0.9 % 100 mL IVPB       "Followed by" Linked Group Details   100 mg 200 mL/hr over 30 Minutes Intravenous Daily 08/23/20 0101 08/25/20 1202   08/23/20 0600  cefTRIAXone (ROCEPHIN) 2 g in sodium chloride 0.9 % 100 mL IVPB  2 g 200 mL/hr over 30 Minutes Intravenous Every 24 hours 08/23/20 0529     08/23/20 0130  remdesivir 200 mg in sodium chloride 0.9% 250 mL IVPB       "Followed by" Linked Group Details   200 mg 580 mL/hr over 30 Minutes Intravenous Once 08/23/20 0101 08/23/20 0150      Subjective: Today, patient was seen and examined at bedside.  Patient denies any nausea vomiting fever chills but was little sleepy.  Received Ativan for tachycardia and tachypnea.  Denies coughing or blood per mouth  Objective: Vitals:   08/25/20 1033 08/25/20 1134  BP: (!) 146/92 (!) 146/87  Pulse: (!) 102 99  Resp: (!) 22 (!) 22  Temp: 98.1 F (36.7 C) 97.7 F (36.5 C)  SpO2: 96% 98%    Intake/Output Summary (Last 24 hours) at 08/25/2020 1313 Last data filed at 08/25/2020 1132 Gross per 24 hour  Intake 1261 ml  Output 1850 ml  Net -589 ml   Filed Weights   08/24/20 0534 08/25/20 0500  Weight: 92.1 kg 95.1 kg   Body mass index is 30.08 kg/m.   Physical Exam:  GENERAL: Patient is mildly sleepy, communicative, answering appropriately not in obvious distress. HENT: Mild pallor noted, icterus noted++.  Pupils equally reactive to light.  Dried blood in the oral cavity, bilateral subconjunctival hemorrhage noted. NECK: is supple, no gross swelling noted. CHEST: . No crackles or wheezes.  Diminished breath sounds bilaterally. CVS: S1 and S2 heard, no murmur. Regular rate and rhythm.  ABDOMEN: Soft, nontender, bowel sounds are present. EXTREMITIES: Bilateral lower extremity  edema. CNS: Cranial nerves are intact.  Moves all extremities, mildly sleepy today, SKIN: warm and dry,  Icteric skin.  Data Review: I have personally reviewed the following laboratory data and studies,  CBC: Recent Labs  Lab 08/22/20 1817 08/23/20 0426 08/23/20 1103 08/23/20 1259 08/24/20 0750  WBC 24.6* 19.4* 16.8* 21.5* 22.5*  NEUTROABS 21.9* 16.7* 15.5* 19.0* 18.4*  HGB 11.4* 10.1* 10.5* 10.7* 9.1*  HCT 31.3* 28.0* 29.5* 29.4* 24.9*  MCV 98.4 97.9 98.3 97.0 98.0  PLT 30* 33* 34* 32* 57*   Basic Metabolic Panel: Recent Labs  Lab 08/22/20 1817 08/23/20 0426 08/23/20 1500 08/24/20 0420 08/25/20 0349  NA 124* 125* 126* 126* 129*  K 4.4 3.7 3.8 4.2 4.5  CL 88* 92* 94* 92* 94*  CO2 20* 17* 14* 16* 16*  GLUCOSE 148* 96 124* 102* 165*  BUN 120* 131* 120* 134* 130*  CREATININE 3.44* 3.41* 3.26* 3.57* 2.92*  CALCIUM 8.2* 7.4* 7.0* 7.4* 7.4*  MG  --   --   --  2.5* 3.1*  PHOS  --   --   --   --  8.7*   Liver Function Tests: Recent Labs  Lab 08/22/20 1817 08/23/20 0426 08/24/20 0420 08/25/20 0349  AST 271* 195* 172* 193*  ALT 76* 59* 51* 57*  ALKPHOS 212* 194* 261* 308*  BILITOT 9.6* 8.9* 10.2* 10.7*  PROT 7.3 6.4* 6.4* 7.0  ALBUMIN 2.3* 2.3* 2.5* 2.9*   Recent Labs  Lab 08/23/20 1500  LIPASE 99*   Recent Labs  Lab 08/22/20 1817 08/25/20 0349  AMMONIA 44* 68*   Cardiac Enzymes: Recent Labs  Lab 08/23/20 1500  CKTOTAL 47*   BNP (last 3 results) No results for input(s): BNP in the last 8760 hours.  ProBNP (last 3 results) No results for input(s): PROBNP in the last 8760  hours.  CBG: No results for input(s): GLUCAP in  the last 168 hours. Recent Results (from the past 240 hour(s))  SARS Coronavirus 2 by RT PCR (hospital order, performed in [facility] hospital lab) Nasopharyngeal Nasopharyngeal Swab     Status: Abnormal   Collection Time: 08/22/20  7:18 PM   Specimen: Nasopharyngeal Swab  Result Value Ref Range Status   SARS Coronavirus 2 POSITIVE (A) NEGATIVE Final    Comment: RESULT CALLED TO, READ BACK BY AND VERIFIED WITH: ZULETA K. 1.31.22 @ 2100 BY MECIAL J. (NOTE) SARS-CoV-2 target nucleic acids are DETECTED  SARS-CoV-2 RNA is generally detectable in upper respiratory specimens  during the acute phase of infection.  Positive results are indicative  of the presence of the identified virus, but do not rule out bacterial infection or co-infection with other pathogens not detected by the test.  Clinical correlation with patient history and  other diagnostic information is necessary to determine patient infection status.  The expected result is negative.  Fact Sheet for Patients:   BoilerBrush.com.cy   Fact Sheet for Healthcare Providers:   https://pope.com/    This test is not yet approved or cleared by the [place] FDA and  has been authorized for detection and/or diagnosis of SARS-CoV-2 by FDA under an Emergency Use Authorization (EUA).  This EUA will remain in effect (meaning this  test can be used) for the duration of  the COVID-19 declaration under Section 564(b)(1) of the Act, 21 U.S.C. section 360-bbb-3(b)(1), unless the authorization is terminated or revoked sooner.  Performed at [facility], [address] [place], [place] [address]      Studies: No results found.    Joycelyn Das, MD  [facility] 08/25/2020  If 7PM-7AM, please contact night-coverage

## 2020-08-25 NOTE — Progress Notes (Signed)
Patient ID: [name], male   DOB: 09-Jul-1986, 35 y.o.   MRN: [id number] S: Much more lethargic today and complaining of SOB O:BP (!) 146/87   Pulse 99   Temp 97.7 F (36.5 C)   Resp (!) 22   Wt 95.1 kg   SpO2 98%   BMI 30.08 kg/m   Intake/Output Summary (Last 24 hours) at 08/25/2020 1333 Last data filed at 08/25/2020 1132 Gross per 24 hour  Intake 1261 ml  Output 1850 ml  Net -589 ml   Intake/Output: I/O last 3 completed shifts: In: 3203 [I.V.:2074.8; Blood:305; IV Piggyback:823.2] Out: 2150 [Urine:2150]  Intake/Output this shift:  Total I/O In: 100 [IV Piggyback:100] Out: 400 [Urine:400] Weight change: 3 kg Gen: lethargic but arouseable and knows he is in an hospital CVS: RRR Resp: occ rhonchi Abd: distended, +BS, soft, NT Ext: trace pretibial edema  Recent Labs  Lab 08/22/20 1817 08/23/20 0426 08/23/20 1500 08/24/20 0420 08/25/20 0349  NA 124* 125* 126* 126* 129*  K 4.4 3.7 3.8 4.2 4.5  CL 88* 92* 94* 92* 94*  CO2 20* 17* 14* 16* 16*  GLUCOSE 148* 96 124* 102* 165*  BUN 120* 131* 120* 134* 130*  CREATININE 3.44* 3.41* 3.26* 3.57* 2.92*  ALBUMIN 2.3* 2.3*  --  2.5* 2.9*  CALCIUM 8.2* 7.4* 7.0* 7.4* 7.4*  PHOS  --   --   --   --  8.7*  AST 271* 195*  --  172* 193*  ALT 76* 59*  --  51* 57*   Liver Function Tests: Recent Labs  Lab 08/23/20 0426 08/24/20 0420 08/25/20 0349  AST 195* 172* 193*  ALT 59* 51* 57*  ALKPHOS 194* 261* 308*  BILITOT 8.9* 10.2* 10.7*  PROT 6.4* 6.4* 7.0  ALBUMIN 2.3* 2.5* 2.9*   Recent Labs  Lab 08/23/20 1500  LIPASE 99*   Recent Labs  Lab 08/22/20 1817 08/25/20 0349  AMMONIA 44* 68*   CBC: Recent Labs  Lab 08/22/20 1817 08/23/20 0426 08/23/20 1103 08/23/20 1259 08/24/20 0750  WBC 24.6* 19.4* 16.8* 21.5* 22.5*  NEUTROABS 21.9* 16.7* 15.5* 19.0* 18.4*  HGB 11.4* 10.1* 10.5* 10.7* 9.1*  HCT 31.3* 28.0* 29.5* 29.4* 24.9*  MCV 98.4 97.9 98.3 97.0 98.0  PLT 30* 33* 34* 32* 57*   Cardiac Enzymes: Recent  Labs  Lab 08/23/20 1500  CKTOTAL 47*   CBG: No results for input(s): GLUCAP in the last 168 hours.  Iron Studies:  Recent Labs    08/25/20 0349  FERRITIN 1,147*   Studies/Results: No results found. . folic acid  1 mg Oral Daily  . lactulose  10 g Oral BID  . LORazepam  0-4 mg Oral Q12H  . methylPREDNISolone (SOLU-MEDROL) injection  60 mg Intravenous Q12H  . multivitamin with minerals  1 tablet Oral Daily  . [START ON 08/26/2020] pantoprazole  40 mg Intravenous Q12H  . thiamine  100 mg Oral Daily   Or  . thiamine  100 mg Intravenous Daily    BMET    Component Value Date/Time   NA 129 (L) 08/25/2020 0349   K 4.5 08/25/2020 0349   CL 94 (L) 08/25/2020 0349   CO2 16 (L) 08/25/2020 0349   GLUCOSE 165 (H) 08/25/2020 0349   BUN 130 (H) 08/25/2020 0349   CREATININE 2.92 (H) 08/25/2020 0349   CALCIUM 7.4 (L) 08/25/2020 0349   GFRNONAA 28 (L) 08/25/2020 0349   GFRAA >60 02/24/2019 1520   CBC    Component Value Date/Time  WBC 22.5 (H) 08/24/2020 0750   RBC 2.54 (L) 08/24/2020 0750   HGB 9.1 (L) 08/24/2020 0750   HCT 24.9 (L) 08/24/2020 0750   PLT 57 (L) 08/24/2020 0750   MCV 98.0 08/24/2020 0750   MCH 35.8 (H) 08/24/2020 0750   MCHC 36.5 (H) 08/24/2020 0750   RDW 14.6 08/24/2020 0750   LYMPHSABS 1.2 08/24/2020 0750   MONOABS 1.0 08/24/2020 0750   EOSABS 0.1 08/24/2020 0750   BASOSABS 0.1 08/24/2020 0750     Assessment/Plan: 1. AKI, non-oliguric- likely multifactorial with NSAIDs and concomitant ACE/HCTZ, poor po intake, 3rd spacing/liver disease, and covid infection. Starting to make some urine which is very dark and thick. Will send urine studies for FeNa and check CK levels as well. No indication for dialysis at this time and continue to follow UOP and SCr with IVF's and IV albumin. Was concern for HRS which is very possible in light of GIB and abnormal LFT's. No cirrhosis or ascites seen on [place]. 1. Continue to hold ACE/HCTZ and NSAIDs 2. Renal US without  obstruction. 3. IVF's and IV albumin 4. Urine Na <10 consistent with pre-renal azotemia, or possible hepatorenal syndrome.  Continue with IVF's for now, no midodrine or octreotide due to improved BP  5. Will also order some serologies to r/o autoimmune process 6. No indication for dialysis at this time.  Unfortunately cannot perform renal biopsy due to severe thrombocytopenia. 7. He is not a candidate for RRT since this is likely related to his alcoholic hepatitis and is not a candidate for liver transplant since he was actively drinking before admission. 2. Epistaxis/hematemasis - GI following and suspect is from oropharynx due to thrombocytopenia, however may have gastritis due to NSAID use. GI signed off, however may need EGD if bleeding persists. 3. Alcoholic hepatitis- with worsening LFT's, rising bilirubin and INR- h/o daily alcohol use as well as tylenol. He is not a candidate for liver transplant.  His overall condition is worsening and I recommend palliative care consult to help set goals/limits of care.  4. Thrombocytopenia- presumably due to Etoh and liver disease, however also on NSAIDs.s/p platelet transfusion and await repeat levels. 5. covid-19 infection- pt now with tachypnea and increased work of breathing.  He looks much more lethargic and uncomfortable.  Recommend palliative care consult as he is not a candidate for liver transplant and with his liver function and respiratory status worsening, his prognosis is very poor.     Irena Cords, MD [employer] [phone]

## 2020-08-26 LAB — CBC WITH DIFFERENTIAL/PLATELET
Abs Immature Granulocytes: 0.72 10*3/uL — ABNORMAL HIGH (ref 0.00–0.07)
Basophils Absolute: 0.1 10*3/uL (ref 0.0–0.1)
Basophils Relative: 0 %
Eosinophils Absolute: 0 10*3/uL (ref 0.0–0.5)
Eosinophils Relative: 0 %
HCT: 26 % — ABNORMAL LOW (ref 39.0–52.0)
Hemoglobin: 9.5 g/dL — ABNORMAL LOW (ref 13.0–17.0)
Immature Granulocytes: 4 %
Lymphocytes Relative: 4 %
Lymphs Abs: 0.8 10*3/uL (ref 0.7–4.0)
MCH: 35.1 pg — ABNORMAL HIGH (ref 26.0–34.0)
MCHC: 36.5 g/dL — ABNORMAL HIGH (ref 30.0–36.0)
MCV: 95.9 fL (ref 80.0–100.0)
Monocytes Absolute: 0.6 10*3/uL (ref 0.1–1.0)
Monocytes Relative: 3 %
Neutro Abs: 18 10*3/uL — ABNORMAL HIGH (ref 1.7–7.7)
Neutrophils Relative %: 89 %
Platelets: 84 10*3/uL — ABNORMAL LOW (ref 150–400)
RBC: 2.71 MIL/uL — ABNORMAL LOW (ref 4.22–5.81)
RDW: 14.7 % (ref 11.5–15.5)
WBC: 20.2 10*3/uL — ABNORMAL HIGH (ref 4.0–10.5)
nRBC: 0 % (ref 0.0–0.2)

## 2020-08-26 LAB — COMPREHENSIVE METABOLIC PANEL
ALT: 63 U/L — ABNORMAL HIGH (ref 0–44)
AST: 187 U/L — ABNORMAL HIGH (ref 15–41)
Albumin: 3.5 g/dL (ref 3.5–5.0)
Alkaline Phosphatase: 301 U/L — ABNORMAL HIGH (ref 38–126)
Anion gap: 16 — ABNORMAL HIGH (ref 5–15)
BUN: 87 mg/dL — ABNORMAL HIGH (ref 6–20)
CO2: 19 mmol/L — ABNORMAL LOW (ref 22–32)
Calcium: 7.8 mg/dL — ABNORMAL LOW (ref 8.9–10.3)
Chloride: 106 mmol/L (ref 98–111)
Creatinine, Ser: 2.28 mg/dL — ABNORMAL HIGH (ref 0.61–1.24)
GFR, Estimated: 37 mL/min — ABNORMAL LOW (ref >60)
Glucose, Bld: 190 mg/dL — ABNORMAL HIGH (ref 70–99)
Potassium: 3.9 mmol/L (ref 3.5–5.1)
Sodium: 141 mmol/L (ref 135–145)
Total Bilirubin: 8.6 mg/dL — ABNORMAL HIGH (ref 0.3–1.2)
Total Protein: 7.6 g/dL (ref 6.5–8.1)

## 2020-08-26 LAB — PROTIME-INR
INR: 2 — ABNORMAL HIGH (ref 0.8–1.2)
Prothrombin Time: 21.8 seconds — ABNORMAL HIGH (ref 11.4–15.2)

## 2020-08-26 LAB — MAGNESIUM: Magnesium: 3.3 mg/dL — ABNORMAL HIGH (ref 1.7–2.4)

## 2020-08-26 LAB — COMPLEMENT, TOTAL: Compl, Total (CH50): >60 U/mL (ref >41)

## 2020-08-26 LAB — D-DIMER, QUANTITATIVE: D-Dimer, Quant: 3.37 ug/mL-FEU — ABNORMAL HIGH (ref 0.00–0.50)

## 2020-08-26 LAB — ANTINUCLEAR ANTIBODIES, IFA: ANA Ab, IFA: NEGATIVE

## 2020-08-26 LAB — C-REACTIVE PROTEIN: CRP: 18.1 mg/dL — ABNORMAL HIGH (ref <1.0)

## 2020-08-26 LAB — FERRITIN: Ferritin: 1195 ng/mL — ABNORMAL HIGH (ref 24–336)

## 2020-08-26 MED ORDER — LORAZEPAM 1 MG PO TABS
0.0000 mg | ORAL_TABLET | Freq: Four times a day (QID) | ORAL | Status: AC
  Administered 2020-08-26 – 2020-08-28 (×7): 1 mg via ORAL
  Filled 2020-08-26 (×2): qty 1
  Filled 2020-08-26: qty 2
  Filled 2020-08-26: qty 1
  Filled 2020-08-26: qty 2
  Filled 2020-08-26 (×3): qty 1

## 2020-08-26 MED ORDER — CARVEDILOL 3.125 MG PO TABS
3.1250 mg | ORAL_TABLET | Freq: Two times a day (BID) | ORAL | Status: DC
  Administered 2020-08-26 – 2020-08-29 (×7): 3.125 mg via ORAL
  Filled 2020-08-26 (×7): qty 1

## 2020-08-26 MED ORDER — HYDRALAZINE HCL 20 MG/ML IJ SOLN
10.0000 mg | Freq: Four times a day (QID) | INTRAMUSCULAR | Status: DC | PRN
  Administered 2020-08-26 – 2020-08-27 (×2): 10 mg via INTRAVENOUS
  Filled 2020-08-26 (×2): qty 1

## 2020-08-26 MED ORDER — SODIUM CHLORIDE 0.9 % IV SOLN
100.0000 mg | Freq: Every day | INTRAVENOUS | Status: AC
  Administered 2020-08-26 – 2020-08-27 (×2): 100 mg via INTRAVENOUS
  Filled 2020-08-26 (×2): qty 20

## 2020-08-26 MED ORDER — LORAZEPAM 1 MG PO TABS: 0.0000 mg | ORAL_TABLET | Freq: Two times a day (BID) | ORAL | Status: DC

## 2020-08-26 NOTE — Progress Notes (Signed)
PROGRESS NOTE  [name] SFK:[id number] DOB: [birth year] DOA: 08/22/2020 PCP: [name], PA   LOS: 4 days   Brief narrative:  [name] is a 35 y.o. male with history of alcohol use disorder, hypertension, depression, left hip avascular necrosis on Robaxin and tramadol presented to the hospital with epistaxis and throwing up  blood 24 hours prior to presentation.   Patient had been on Voltaren as well.  In the ER , patient was initially noted to be hypotensive and received 2 L of normal saline.  Creatinine was significantly elevated at 3.4 with baseline creatinine of around 0.9.  LFTs were elevated with thrombocytopenia.  Patient also had leukocytosis.  Right upper quadrant ultrasound showed features concerning for  hepatic steatosis.  No definite evidence of cirrhosis. He tested positive for Covid infection and well. Patient was then admitted for acute renal failure with thrombocytopenia, epistaxis, acute hepatitis and Covid infection.  During hospitalization, patient was seen by GI.  Received 2 units of platelets with improvement of bleeding.  Patient has been started on IV Protonix as well.  GI has recommended steroid on discharge and has signed off again. Patient does have impaired renal function and nephrology on board.  Assessment/Plan:  Principal Problem:   ARF (acute renal failure) (HCC) Active Problems:   Alcohol dependence (HCC)   Thrombocytopenia (HCC)   Alcoholic hepatitis   Hypertension   COVID-19 virus infection  Nonoliguric acute kidney injury- likely multifactorial from non-NSAIDs at home including lisinopril HCTZ, poor oral intake, Covid infection, possible hepatorenal syndrome.  Received IV fluids and albumin. Currently on IV albumin. Hold off with IV fluids.  Renal ultrasound showed no evidence of obstruction. ASO titer elevated but other serology negative so far. Nephrology on board. Not a candidate for octreotide or midodrine due to normal blood  pressure. Not a good candidate for renal replacement therapy due to liver dysfunction. Patient is not a good candidate for liver transplant as well.   Lab Results  Component Value Date   CREATININE 2.28 (H) 08/26/2020   CREATININE 2.92 (H) 08/25/2020   CREATININE 3.57 (H) 08/24/2020    Severe thrombocytopenia with epistaxis on presentation. Thought to be secondary to alcoholic liver disease Covid infection. Negative hepatitis and HIV panel.  Status post transfusion of platelets 2 units on 08/23/2020 and bleeding has stopped after that. Platelet trending up. Continue steroids for now.  CBC Latest Ref Rng & Units 08/26/2020 08/25/2020 08/24/2020  WBC 4.0 - 10.5 K/uL 20.2(H) 18.6(H) 22.5(H)  Hemoglobin 13.0 - 17.0 g/dL 1.7(C) 9.4(W) 9.6(P)  Hematocrit 39.0 - 52.0 % 26.0(L) 26.9(L) 24.9(L)  Platelets 150 - 400 K/uL 84(L) 71(L) 57(L)    Hyponatremia. Likely secondary to liver disease. Has improved at this time after volume replacement.Marland Kitchen   COVID-19 infection acute hypoxic respiratory failure.  Repeat chest x-ray from 08/25/2020 showed some congestion/infiltrate in the lungs. Currently on 2 L of oxygen by nasal cannula. Continue IV remdesivir and Solu-Medrol due to hypoxia. Trend. inflammatory markers.  Check LFTs closely on remdesivir.  LFT has slightly trended down including bilirubin but INR has gone up.   COVID-19 Labs  Recent Labs    08/24/20 0420 08/25/20 0349 08/26/20 0351  DDIMER 3.79* 6.11* 3.37*  FERRITIN 1,186* 1,147* 1,195*  CRP 26.1* 29.4* 18.1*    Lab Results  Component Value Date   SARSCOV2NAA POSITIVE (A) 08/22/2020   SARSCOV2NAA DETECTED (A) 02/24/2019   Alcoholic hepatitis with jaundice -seen by GI for follow-up today. GI has recommended prednisolone  40 mg daily for 4 weeks then taper after completion of IV Solu-Medrol while in the hospital. Rising INR levels. Overall prognosis of the patient is guarded. Not a candidate for liver transplant due to ongoing alcohol  usage.  Acute blood loss anemia secondary to epistaxis/hematemesis.     Off aspirin . Patient should avoid NSAIDs. Transfuse PRBC/platelets as necessary. Latest hemoglobin of 9.5.  Leukocytosis likely reactive. On Rocephin for SBP prophylaxis.   On IV Solu-Medrol as well. X-ray with infiltrates but unlikely to be bacterial pneumonia. Continue steroids for now.  CBC Latest Ref Rng & Units 08/26/2020 08/25/2020 08/24/2020  WBC 4.0 - 10.5 K/uL 20.2(H) 18.6(H) 22.5(H)  Hemoglobin 13.0 - 17.0 g/dL 1.6(X9.5(L) 0.9(U9.8(L) 0.4(V9.1(L)  Hematocrit 39.0 - 52.0 % 26.0(L) 26.9(L) 24.9(L)  Platelets 150 - 400 K/uL 84(L) 71(L) 57(L)    History of hypertension  Lisinopril and hydrochlorothiazide on hold due to AKI. Episodes of elevated hypertension possibly secondary to agitation and mild alcohol withdrawal. Add hydralazine as needed for hypertension.  Alcohol abuse with withdrawal.  Patient has significantly cut down on his alcohol and drinks around 1-2 beers a day.  On  CIWA protocol.  As needed Ativan. Patient does have episodes of diaphoresis tachycardia hypertension and agitation. Continue lactulose. Pulling on lines and required mittens.  Left hip pain secondary to avascular necrosis per the patient.  Patient was supposed to have surgical intervention done as outpatient.  Avoid NSAIDs if possible especially in the context of acute kidney injury.Marland Kitchen.   History of sleep apnea. Holds breathe during sleep. He was supposed to have sleep study as outpatient. He has typical symptoms of sleep apnea as per the significant other.   History of hypertension, palpitations.  Patient was supposed to have Holter monitor as outpatient as per the significant other. 2D echocardiogram performed showed LV ejection fraction of 55 to 60%. She initial EKG with normal sinus rhythm.  Anxiety. Anxiety at baseline. Currently on Ativan as per CIWA protocol.  Erythematous skin rash. Improved somewhat. Could be secondary to viral infection. Several  serologies have been sent.  DVT prophylaxis: SCDs Start: 08/22/20 2135.  Code Status: Full code  Family Communication:  I again spoke with the patient's significant other  Ms Bonita QuinLinda on the phone and updated her about the clinical condition of the patient.  Overall prognosis with the patient is guarded at this time.  Status is: Inpatient  Remains inpatient appropriate because: IV treatments appropriate due to intensity of illness or inability to take PO, Inpatient level of care appropriate due to severity of illness, acute kidney injury, hypoxic respiratory failure, alcoholic hepatitis,  Dispo: The patient is from: Home              Anticipated d/c is to: Home              Anticipated d/c date is: 3 days or more              Patient currently is not medically stable to d/c.   Difficult to place patient No  Consultants:  Nephrology  GI  Procedures:  Platelet transfusion 2 units on 08/23/2020  Anti-infectives:  Marland Kitchen. Rocephin IV 2/1> . Remdesivir 2/1>  Anti-infectives (From admission, onward)   Start     Dose/Rate Route Frequency Ordered Stop   08/26/20 1400  remdesivir 100 mg in sodium chloride 0.9 % 100 mL IVPB        100 mg 200 mL/hr over 30 Minutes Intravenous Daily 08/26/20 1243 08/28/20 0959  08/24/20 1000  remdesivir 100 mg in sodium chloride 0.9 % 100 mL IVPB       "Followed by" Linked Group Details   100 mg 200 mL/hr over 30 Minutes Intravenous Daily 08/23/20 0101 08/25/20 1202   08/23/20 0600  cefTRIAXone (ROCEPHIN) 2 g in sodium chloride 0.9 % 100 mL IVPB        2 g 200 mL/hr over 30 Minutes Intravenous Every 24 hours 08/23/20 0529     08/23/20 0130  remdesivir 200 mg in sodium chloride 0.9% 250 mL IVPB       "Followed by" Linked Group Details   200 mg 580 mL/hr over 30 Minutes Intravenous Once 08/23/20 0101 08/23/20 0150     Subjective: Today, seen and examined at bedside. Patient was alert awake communicative denied any pain nausea vomiting. Subsequently,  nursing staff reported that he was trying to pull lines and was put on mittens.   Objective: Vitals:   08/26/20 0334 08/26/20 1441  BP: 133/74 (!) 159/102  Pulse: 93 95  Resp: (!) 22 19  Temp: 98.4 F (36.9 C) 97.6 F (36.4 C)  SpO2: 96% 96%    Intake/Output Summary (Last 24 hours) at 08/26/2020 1449 Last data filed at 08/26/2020 1440 Gross per 24 hour  Intake 832 ml  Output 3275 ml  Net -2443 ml   Filed Weights   08/24/20 0534 08/25/20 0500 08/26/20 0334  Weight: 92.1 kg 95.1 kg 87.3 kg   Body mass index is 27.62 kg/m.   Physical Exam: General: Alert awake and mildly communicative, not in distress, HENT:   Mild pallor noted. Deeply icteric.  Chest:  Diminished breath sounds bilaterally. No wheezing noted. Coarse breath sounds. CVS: S1 &S2 heard. No murmur.  Regular rate and rhythm. Abdomen: Soft, nontender.  Bowel sounds are heard. Nontender on palpation. Extremities: No cyanosis, clubbing lower extremity edema noted, peripheral pulses are palpable. Psych: Alert, awake and communicative, anxious CNS:  No cranial nerve deficits. Moving all extremities. Skin: Warm and dry. Erythematous rash noted over the skin. Icteric  Data Review: I have personally reviewed the following laboratory data and studies,  CBC: Recent Labs  Lab 08/23/20 0426 08/23/20 1103 08/23/20 1259 08/24/20 0750 08/25/20 1445 08/26/20 0351  WBC 19.4* 16.8* 21.5* 22.5* 18.6* 20.2*  NEUTROABS 16.7* 15.5* 19.0* 18.4*  --  18.0*  HGB 10.1* 10.5* 10.7* 9.1* 9.8* 9.5*  HCT 28.0* 29.5* 29.4* 24.9* 26.9* 26.0*  MCV 97.9 98.3 97.0 98.0 95.7 95.9  PLT 33* 34* 32* 57* 71* 84*   Basic Metabolic Panel: Recent Labs  Lab 08/23/20 0426 08/23/20 1500 08/24/20 0420 08/25/20 0349 08/26/20 0351  NA 125* 126* 126* 129* 141  K 3.7 3.8 4.2 4.5 3.9  CL 92* 94* 92* 94* 106  CO2 17* 14* 16* 16* 19*  GLUCOSE 96 124* 102* 165* 190*  BUN 131* 120* 134* 130* 87*  CREATININE 3.41* 3.26* 3.57* 2.92* 2.28*  CALCIUM  7.4* 7.0* 7.4* 7.4* 7.8*  MG  --   --  2.5* 3.1* 3.3*  PHOS  --   --   --  8.7*  --    Liver Function Tests: Recent Labs  Lab 08/22/20 1817 08/23/20 0426 08/24/20 0420 08/25/20 0349 08/26/20 0351  AST 271* 195* 172* 193* 187*  ALT 76* 59* 51* 57* 63*  ALKPHOS 212* 194* 261* 308* 301*  BILITOT 9.6* 8.9* 10.2* 10.7* 8.6*  PROT 7.3 6.4* 6.4* 7.0 7.6  ALBUMIN 2.3* 2.3* 2.5* 2.9* 3.5   Recent Labs  Lab 08/23/20 1500  LIPASE 99*   Recent Labs  Lab 08/22/20 1817 08/25/20 0349  AMMONIA 44* 68*   Cardiac Enzymes: Recent Labs  Lab 08/23/20 1500  CKTOTAL 47*   BNP (last 3 results) No results for input(s): BNP in the last 8760 hours.  ProBNP (last 3 results) No results for input(s): PROBNP in the last 8760 hours.  CBG: No results for input(s): GLUCAP in the last 168 hours. Recent Results (from the past 240 hour(s))  SARS Coronavirus 2 by RT PCR (hospital order, performed in [facility] hospital lab) Nasopharyngeal Nasopharyngeal Swab     Status: Abnormal   Collection Time: 08/22/20  7:18 PM   Specimen: Nasopharyngeal Swab  Result Value Ref Range Status   SARS Coronavirus 2 POSITIVE (A) NEGATIVE Final    Comment: RESULT CALLED TO, READ BACK BY AND VERIFIED WITH: ZULETA K. 1.31.22 @ 2100 BY MECIAL J. (NOTE) SARS-CoV-2 target nucleic acids are DETECTED  SARS-CoV-2 RNA is generally detectable in upper respiratory specimens  during the acute phase of infection.  Positive results are indicative  of the presence of the identified virus, but do not rule out bacterial infection or co-infection with other pathogens not detected by the test.  Clinical correlation with patient history and  other diagnostic information is necessary to determine patient infection status.  The expected result is negative.  Fact Sheet for Patients:   BoilerBrush.com.cy   Fact Sheet for Healthcare Providers:   https://pope.com/    This test is not  yet approved or cleared by the [place] FDA and  has been authorized for detection and/or diagnosis of SARS-CoV-2 by FDA under an Emergency Use Authorization (EUA).  This EUA will remain in effect (meaning this  test can be used) for the duration of  the COVID-19 declaration under Section 564(b)(1) of the Act, 21 U.S.C. section 360-bbb-3(b)(1), unless the authorization is terminated or revoked sooner.  Performed at [facility], [address] [place], [place] [address]      Studies: DG CHEST PORT 1 VIEW  Result Date: 08/25/2020 CLINICAL DATA:  Hypoxia EXAM: PORTABLE CHEST 1 VIEW COMPARISON:  August 22, 2020 FINDINGS: The cardiomediastinal silhouette is unchanged and enlarged in contour.Low lung volumes. No pleural effusion. No pneumothorax. Diffuse interstitial prominence and peribronchial cuffing. Visualized abdomen is unremarkable. No acute osseous abnormality. IMPRESSION: Constellation of findings may reflect underlying pulmonary edema. Atypical infection could present similarly. Electronically Signed   By: Meda Klinefelter MD   On: 08/25/2020 14:52   ECHOCARDIOGRAM LIMITED  Result Date: 08/25/2020    ECHOCARDIOGRAM LIMITED REPORT   Patient Name:   [name] Date of Exam: 08/25/2020 Medical Rec #:  [id number]         Height:       70.0 in Accession #:    [id number]        Weight:       209.7 lb Date of Birth:  [birth year]         BSA:          2.130 m Patient Age:    34 years          BP:           146/87 mmHg Patient Gender: M                 HR:           98 bpm. Exam Location:  Inpatient Procedure: Limited Echo, Cardiac Doppler, Color Doppler and Intracardiac  Opacification Agent Indications:    Dyspnea R06.00  History:        Patient has no prior history of Echocardiogram examinations.                 Risk Factors:Hypertension and Current Smoker.  Sonographer:    Renella Cunas RDCS Referring Phys: [id number] [facility] Evalise Abruzzese  Sonographer Comments: Covid  positive. IMPRESSIONS  1. DIfficult acoustic window even with Definity . Left ventricular ejection fraction, by estimation, is 55 to 60%. The left ventricle has normal function. The left ventricular internal cavity size was moderately dilated. Left ventricular diastolic parameters are indeterminate.  2. Right ventricular systolic function is normal. The right ventricular size is normal.  3. The mitral valve is abnormal. Trivial mitral valve regurgitation.  4. The aortic valve is normal in structure. Aortic valve regurgitation is not visualized. FINDINGS  Left Ventricle: DIfficult acoustic window even with Definity. Left ventricular ejection fraction, by estimation, is 55 to 60%. The left ventricle has normal function. Definity contrast agent was given IV to delineate the left ventricular endocardial borders. The left ventricular internal cavity size was moderately dilated. There is no left ventricular hypertrophy. Left ventricular diastolic parameters are indeterminate. Right Ventricle: The right ventricular size is normal. Right vetricular wall thickness was not assessed. Right ventricular systolic function is normal. Left Atrium: Left atrial size was normal in size. Right Atrium: Right atrial size was normal in size. Mitral Valve: The mitral valve is abnormal. There is mild thickening of the mitral valve leaflet(s). Trivial mitral valve regurgitation. Tricuspid Valve: The tricuspid valve is normal in structure. Tricuspid valve regurgitation is trivial. Aortic Valve: The aortic valve is normal in structure. Aortic valve regurgitation is not visualized. Pulmonic Valve: The pulmonic valve was normal in structure. Pulmonic valve regurgitation is not visualized. LEFT VENTRICLE PLAX 2D LVIDd:         5.70 cm LVIDs:         3.90 cm LV PW:         0.80 cm LV IVS:        0.80 cm LVOT diam:     2.40 cm LV SV:         82 LV SV Index:   39 LVOT Area:     4.52 cm  LEFT ATRIUM         Index LA diam:    4.10 cm 1.93 cm/m   AORTIC VALVE LVOT Vmax:   104.00 cm/s LVOT Vmean:  72.000 cm/s LVOT VTI:    0.182 m  AORTA Ao Root diam: 3.50 cm  SHUNTS Systemic VTI:  0.18 m Systemic Diam: 2.40 cm Dietrich Pates MD Electronically signed by Dietrich Pates MD Signature Date/Time: 08/25/2020/7:52:57 PM    Final       Joycelyn Das, MD  [facility] 08/26/2020  If 7PM-7AM, please contact night-coverage

## 2020-08-26 NOTE — Progress Notes (Signed)
[facility] Gastroenterology Progress Note  [name] Barker 35 y.o. [birth year]  CC:  Abnormal LFTs  Subjective: Patient denies abdominal pain, nausea, or vomiting.  Per NT, had a brown bowel movement this morning without signs of any melena or hematochezia.  ROS : Review of Systems  Cardiovascular: Negative for chest pain and palpitations.  Gastrointestinal: Negative for abdominal pain, blood in stool, constipation, diarrhea, heartburn, melena, nausea and vomiting.   Objective: Vital signs in last 24 hours: Vitals:   08/25/20 2330 08/26/20 0334  BP: (!) 153/96 133/74  Pulse: (!) 103 93  Resp: (!) 24 (!) 22  Temp: 98 F (36.7 C) 98.4 F (36.9 C)  SpO2: 98% 96%    Physical Exam:  General:  Lethargic, cooperative, no distress  Head:  Normocephalic, without obvious abnormality, atraumatic  Eyes:  Scleral icterus, EOMs intact  Lungs:   Mild tachypnea but no respiratory distress  Heart:  Regular rate and rhythm, S1, S2 normal  Abdomen:   Soft, non-tender, non-distended, bowel sounds active all four quadrants,  no guarding or peritoneal signs  Extremities: Extremities normal, atraumatic, no  edema  Pulses: 2+ and symmetric    Lab Results: Recent Labs    08/25/20 0349 08/26/20 0351  NA 129* 141  K 4.5 3.9  CL 94* 106  CO2 16* 19*  GLUCOSE 165* 190*  BUN 130* 87*  CREATININE 2.92* 2.28*  CALCIUM 7.4* 7.8*  MG 3.1* 3.3*  PHOS 8.7*  --    Recent Labs    08/25/20 0349 08/26/20 0351  AST 193* 187*  ALT 57* 63*  ALKPHOS 308* 301*  BILITOT 10.7* 8.6*  PROT 7.0 7.6  ALBUMIN 2.9* 3.5   Recent Labs    08/24/20 0750 08/25/20 1445 08/26/20 0351  WBC 22.5* 18.6* 20.2*  NEUTROABS 18.4*  --  18.0*  HGB 9.1* 9.8* 9.5*  HCT 24.9* 26.9* 26.0*  MCV 98.0 95.7 95.9  PLT 57* 71* 84*   Recent Labs    08/25/20 0349 08/26/20 0351  LABPROT 20.2* 21.8*  INR 1.8* 2.0*      Assessment: Abnormal LFTs, most likely due to alcoholic hepatitis.  Hepatic discriminant function  >32 as of 08/26/20.  COVID-19 could also be playing a role in LFT elevations.  No cirrhotic changes visualized on [place] 08/22/20. -T. Bili 8.6 today, decreased from 10.7 yesterday -ASR 187/ ALT 63/ ALP 301, stable as compared to yesterday -WBCs 20.2, slightly increased from 18.6 yesterday -PT 2.0/ INR 21.8 today -Platelets 84 K/uL today  Thrombocytopenia with epistaxis, platelets 84K/uL today.  Improving.  Patient received 2u platelets on 2/2  COVID-19 infection  AKI: BUN 87/ Cr 2.28  Normocytic anemia, Hgb 9.5, stable  Plan: Patient is on methylprednisolone 60 mg BID due to COVID-19 infection.  Continue corticosteroids.  When patient no longer needs CS for CV-19, transition to PO prednisolone 40 mg daily for 4 weeks for alcoholic hepatitis.  Then, decrease by 10 mg per day every four days until a dose of 10 mg per day is reached, at which point decrease by 5 mg per day every three days until off.  Continue to trend LFTs.  Patient without any signs of GI bleeding, though did have epistaxis when platelets were ~30K/uL.  Edrick Kins PA-C 08/26/2020, 10:26 AM  Contact #  [phone]

## 2020-08-26 NOTE — Progress Notes (Signed)
Received notification from nursing staff that pt significant other, Blenda Bridegroom, is call staff frequently via phone with concerns about pt care.  Significant other stays on face time with the pt continuously.  Leadership called to speak with Bonita Quin to address any concerns she may have regarding pts care.  Bonita Quin states pt IV was beeping, so she wanted to make sure staff aware, was concerned about patient receiving ativan, and getting out of bed.  Per pt chart and Bonita Quin, ativan was decreased last night and bed alarm on. Emphasized with Bonita Quin that staff are frequently rounding on the patient and a tele camera would be placed in the pt room for additional staff visualization of pt and staff immediate response for emergent issues.  Patient and Bonita Quin agree to Administrator, sports.  Ativan reordered by MD and patient agrees to receive medication. Pt alert and oriented X 3. Bonita Quin verbalized understanding of plan and would reduce staff calls to emergent issues only.

## 2020-08-26 NOTE — Progress Notes (Signed)
PT Cancellation Note  Patient Details Name: [name] MRN: [id number] DOB: [birth year]   Cancelled Treatment:    Reason Eval/Treat Not Completed: Medical issues which prohibited therapy   Rada Hay 08/26/2020, 2:02 PM Blanchard Kelch PT Acute Rehabilitation Services Pager [phone] Office [phone]

## 2020-08-26 NOTE — Progress Notes (Signed)
Nephrology Progress Note:   Patient ID: [name], male   DOB: [birth year], 35 y.o.   MRN: [id number]  S: he had 2.1 liters UOP over 2/3 charted.  On ipad with family and I updated them re: renal function  Review of systems:  Limited with mental status States breathing "ok" though seems short of breath Ate grits this am Denies cp  O:BP 133/74 (BP Location: Left Arm)   Pulse 93   Temp 98.4 F (36.9 C) (Oral)   Resp (!) 22   Wt 87.3 kg   SpO2 96%   BMI 27.62 kg/m   Intake/Output Summary (Last 24 hours) at 08/26/2020 1154 Last data filed at 08/26/2020 1045 Gross per 24 hour  Intake 596 ml  Output 2675 ml  Net -2079 ml   Intake/Output: I/O last 3 completed shifts: In: 1786 [P.O.:600; I.V.:854.4; IV Piggyback:331.6] Out: 3575 [Urine:3575]  Intake/Output this shift:  Total I/O In: 236 [P.O.:236] Out: 950 [Urine:950] Weight change: -7.8 kg   General adult male in bed  HEENT normocephalic atraumatic extraocular movements intact Neck supple trachea midline Lungs clear to auscultation bilaterally normal work of breathing at rest but increased with exertion Heart S1S2 no rub Abdomen soft nontender distended Extremities trace edema  Neuro - awake but sleepy on arrival; on ipad with family \ Recent Labs  Lab 08/22/20 1817 08/23/20 0426 08/23/20 1500 08/24/20 0420 08/25/20 0349 08/26/20 0351  NA 124* 125* 126* 126* 129* 141  K 4.4 3.7 3.8 4.2 4.5 3.9  CL 88* 92* 94* 92* 94* 106  CO2 20* 17* 14* 16* 16* 19*  GLUCOSE 148* 96 124* 102* 165* 190*  BUN 120* 131* 120* 134* 130* 87*  CREATININE 3.44* 3.41* 3.26* 3.57* 2.92* 2.28*  ALBUMIN 2.3* 2.3*  --  2.5* 2.9* 3.5  CALCIUM 8.2* 7.4* 7.0* 7.4* 7.4* 7.8*  PHOS  --   --   --   --  8.7*  --   AST 271* 195*  --  172* 193* 187*  ALT 76* 59*  --  51* 57* 63*   Liver Function Tests: Recent Labs  Lab 08/24/20 0420 08/25/20 0349 08/26/20 0351  AST 172* 193* 187*  ALT 51* 57* 63*  ALKPHOS 261* 308* 301*  BILITOT  10.2* 10.7* 8.6*  PROT 6.4* 7.0 7.6  ALBUMIN 2.5* 2.9* 3.5   Recent Labs  Lab 08/23/20 1500  LIPASE 99*   Recent Labs  Lab 08/22/20 1817 08/25/20 0349  AMMONIA 44* 68*   CBC: Recent Labs  Lab 08/23/20 1103 08/23/20 1259 08/24/20 0750 08/25/20 1445 08/26/20 0351  WBC 16.8* 21.5* 22.5* 18.6* 20.2*  NEUTROABS 15.5* 19.0* 18.4*  --  18.0*  HGB 10.5* 10.7* 9.1* 9.8* 9.5*  HCT 29.5* 29.4* 24.9* 26.9* 26.0*  MCV 98.3 97.0 98.0 95.7 95.9  PLT 34* 32* 57* 71* 84*   Cardiac Enzymes: Recent Labs  Lab 08/23/20 1500  CKTOTAL 47*   CBG: No results for input(s): GLUCAP in the last 168 hours.  Iron Studies:  Recent Labs    08/26/20 0351  FERRITIN 1,195*   Studies/Results: DG CHEST PORT 1 VIEW  Result Date: 08/25/2020 CLINICAL DATA:  Hypoxia EXAM: PORTABLE CHEST 1 VIEW COMPARISON:  August 22, 2020 FINDINGS: The cardiomediastinal silhouette is unchanged and enlarged in contour.Low lung volumes. No pleural effusion. No pneumothorax. Diffuse interstitial prominence and peribronchial cuffing. Visualized abdomen is unremarkable. No acute osseous abnormality. IMPRESSION: Constellation of findings may reflect underlying pulmonary edema. Atypical infection could present similarly. Electronically Signed  By: Meda Klinefelter MD   On: 08/25/2020 14:52   ECHOCARDIOGRAM LIMITED  Result Date: 08/25/2020    ECHOCARDIOGRAM LIMITED REPORT   Patient Name:   [name] Date of Exam: 08/25/2020 Medical Rec #:  [id number]         Height:       70.0 in Accession #:    [id number]        Weight:       209.7 lb Date of Birth:  1986/01/29         BSA:          2.130 m Patient Age:    34 years          BP:           146/87 mmHg Patient Gender: M                 HR:           98 bpm. Exam Location:  Inpatient Procedure: Limited Echo, Cardiac Doppler, Color Doppler and Intracardiac            Opacification Agent Indications:    Dyspnea R06.00  History:        Patient has no prior history of  Echocardiogram examinations.                 Risk Factors:Hypertension and Current Smoker.  Sonographer:    Renella Cunas RDCS Referring Phys: [id number] [facility] POKHREL  Sonographer Comments: Covid positive. IMPRESSIONS  1. DIfficult acoustic window even with Definity . Left ventricular ejection fraction, by estimation, is 55 to 60%. The left ventricle has normal function. The left ventricular internal cavity size was moderately dilated. Left ventricular diastolic parameters are indeterminate.  2. Right ventricular systolic function is normal. The right ventricular size is normal.  3. The mitral valve is abnormal. Trivial mitral valve regurgitation.  4. The aortic valve is normal in structure. Aortic valve regurgitation is not visualized. FINDINGS  Left Ventricle: DIfficult acoustic window even with Definity. Left ventricular ejection fraction, by estimation, is 55 to 60%. The left ventricle has normal function. Definity contrast agent was given IV to delineate the left ventricular endocardial borders. The left ventricular internal cavity size was moderately dilated. There is no left ventricular hypertrophy. Left ventricular diastolic parameters are indeterminate. Right Ventricle: The right ventricular size is normal. Right vetricular wall thickness was not assessed. Right ventricular systolic function is normal. Left Atrium: Left atrial size was normal in size. Right Atrium: Right atrial size was normal in size. Mitral Valve: The mitral valve is abnormal. There is mild thickening of the mitral valve leaflet(s). Trivial mitral valve regurgitation. Tricuspid Valve: The tricuspid valve is normal in structure. Tricuspid valve regurgitation is trivial. Aortic Valve: The aortic valve is normal in structure. Aortic valve regurgitation is not visualized. Pulmonic Valve: The pulmonic valve was normal in structure. Pulmonic valve regurgitation is not visualized. LEFT VENTRICLE PLAX 2D LVIDd:         5.70 cm LVIDs:         3.90 cm  LV PW:         0.80 cm LV IVS:        0.80 cm LVOT diam:     2.40 cm LV SV:         82 LV SV Index:   39 LVOT Area:     4.52 cm  LEFT ATRIUM         Index LA diam:  4.10 cm 1.93 cm/m  AORTIC VALVE LVOT Vmax:   104.00 cm/s LVOT Vmean:  72.000 cm/s LVOT VTI:    0.182 m  AORTA Ao Root diam: 3.50 cm  SHUNTS Systemic VTI:  0.18 m Systemic Diam: 2.40 cm Dietrich Pates MD Electronically signed by Dietrich Pates MD Signature Date/Time: 08/25/2020/7:52:57 PM    Final    . carvedilol  3.125 mg Oral BID WC  . folic acid  1 mg Oral Daily  . lactulose  10 g Oral BID  . LORazepam  0-4 mg Oral Q6H   Followed by  . [START ON 08/28/2020] LORazepam  0-4 mg Oral Q12H  . methylPREDNISolone (SOLU-MEDROL) injection  60 mg Intravenous Q12H  . multivitamin with minerals  1 tablet Oral Daily  . pantoprazole  40 mg Intravenous Q12H  . thiamine  100 mg Oral Daily   Or  . thiamine  100 mg Intravenous Daily    BMET    Component Value Date/Time   NA 141 08/26/2020 0351   K 3.9 08/26/2020 0351   CL 106 08/26/2020 0351   CO2 19 (L) 08/26/2020 0351   GLUCOSE 190 (H) 08/26/2020 0351   BUN 87 (H) 08/26/2020 0351   CREATININE 2.28 (H) 08/26/2020 0351   CALCIUM 7.8 (L) 08/26/2020 0351   GFRNONAA 37 (L) 08/26/2020 0351   GFRAA >60 02/24/2019 1520   CBC    Component Value Date/Time   WBC 20.2 (H) 08/26/2020 0351   RBC 2.71 (L) 08/26/2020 0351   HGB 9.5 (L) 08/26/2020 0351   HCT 26.0 (L) 08/26/2020 0351   PLT 84 (L) 08/26/2020 0351   MCV 95.9 08/26/2020 0351   MCH 35.1 (H) 08/26/2020 0351   MCHC 36.5 (H) 08/26/2020 0351   RDW 14.7 08/26/2020 0351   LYMPHSABS 0.8 08/26/2020 0351   MONOABS 0.6 08/26/2020 0351   EOSABS 0.0 08/26/2020 0351   BASOSABS 0.1 08/26/2020 0351     Assessment/Plan: 1. AKI, non-oliguric- likely multifactorial with NSAIDs and concomitant ACE/HCTZ, poor po intake, 3rd spacing/liver disease, and covid infection. Starting to make some urine which is very dark and thick. No cirrhosis or ascites  seen on [place].  Urine Na <10 consistent with pre-renal azotemia, or possible hepatorenal syndrome.  Renal US without obstruction.  Anti-dsDNA neg. Complement ok. ASO elevated at 226. MPO and PR3 neg. CK ok. 1. Continue to hold ACE/HCTZ and NSAIDs 2. No midodrine or octreotide due to improved BP 3. Continue albumin for now.  Defer IV fluids. 4. Noted ASO positive; complement is normal. He is on solumedrol.  ANA pending. Other serologic work-up neg  5. No indication for dialysis at this time.  Unfortunately cannot perform renal biopsy due to severe thrombocytopenia. 6. He is not a candidate for RRT since this is likely related to his alcoholic hepatitis and is not a candidate for liver transplant since he was actively drinking before admission. 2. Epistaxis/hematemasis - GI following and suspect is from oropharynx due to thrombocytopenia, however may have gastritis due to NSAID use. GI signed off, however may need EGD if bleeding persists. 3. Alcoholic hepatitis- with worsening LFT's, rising bilirubin and INR- h/o daily alcohol use as well as tylenol. He is not a candidate for liver transplant.  His overall condition is worsening and recommend palliative care consult to help set goals/limits of care.  4. Thrombocytopenia- presumably due to Etoh and liver disease, however also on NSAIDs.s/p platelet transfusion  5. covid-19 infection- per primary team  Thankfully renal function is improving  however remains quite ill.  Would involve palliative care if they have not seen  Estanislado Emms, MD 12:24 PM  08/26/2020

## 2020-08-26 NOTE — Progress Notes (Signed)
Patient's significant other Bonita Quin) noted to be on continuous facetime with patient on Ipad at bedside.  Bonita Quin has called the unit multiple times so far this shift with various requests.  Leadership is aware and planning to speak to [facility] about her multiple calls and requests,

## 2020-08-27 LAB — COMPREHENSIVE METABOLIC PANEL
ALT: 102 U/L — ABNORMAL HIGH (ref 0–44)
AST: 252 U/L — ABNORMAL HIGH (ref 15–41)
Albumin: 3.5 g/dL (ref 3.5–5.0)
Alkaline Phosphatase: 297 U/L — ABNORMAL HIGH (ref 38–126)
Anion gap: 11 (ref 5–15)
BUN: 81 mg/dL — ABNORMAL HIGH (ref 6–20)
CO2: 20 mmol/L — ABNORMAL LOW (ref 22–32)
Calcium: 8 mg/dL — ABNORMAL LOW (ref 8.9–10.3)
Chloride: 107 mmol/L (ref 98–111)
Creatinine, Ser: 1.33 mg/dL — ABNORMAL HIGH (ref 0.61–1.24)
GFR, Estimated: >60 mL/min (ref >60)
Glucose, Bld: 156 mg/dL — ABNORMAL HIGH (ref 70–99)
Potassium: 3.7 mmol/L (ref 3.5–5.1)
Sodium: 138 mmol/L (ref 135–145)
Total Bilirubin: 8.1 mg/dL — ABNORMAL HIGH (ref 0.3–1.2)
Total Protein: 7.5 g/dL (ref 6.5–8.1)

## 2020-08-27 LAB — CBC WITH DIFFERENTIAL/PLATELET
Abs Immature Granulocytes: 0.51 10*3/uL — ABNORMAL HIGH (ref 0.00–0.07)
Basophils Absolute: 0 10*3/uL (ref 0.0–0.1)
Basophils Relative: 0 %
Eosinophils Absolute: 0 10*3/uL (ref 0.0–0.5)
Eosinophils Relative: 0 %
HCT: 27.3 % — ABNORMAL LOW (ref 39.0–52.0)
Hemoglobin: 9.8 g/dL — ABNORMAL LOW (ref 13.0–17.0)
Immature Granulocytes: 3 %
Lymphocytes Relative: 5 %
Lymphs Abs: 1 10*3/uL (ref 0.7–4.0)
MCH: 35 pg — ABNORMAL HIGH (ref 26.0–34.0)
MCHC: 35.9 g/dL (ref 30.0–36.0)
MCV: 97.5 fL (ref 80.0–100.0)
Monocytes Absolute: 0.7 10*3/uL (ref 0.1–1.0)
Monocytes Relative: 4 %
Neutro Abs: 18.2 10*3/uL — ABNORMAL HIGH (ref 1.7–7.7)
Neutrophils Relative %: 88 %
Platelets: 84 10*3/uL — ABNORMAL LOW (ref 150–400)
RBC: 2.8 MIL/uL — ABNORMAL LOW (ref 4.22–5.81)
RDW: 15.2 % (ref 11.5–15.5)
WBC: 20.5 10*3/uL — ABNORMAL HIGH (ref 4.0–10.5)
nRBC: 0 % (ref 0.0–0.2)

## 2020-08-27 LAB — PROTIME-INR
INR: 1.9 — ABNORMAL HIGH (ref 0.8–1.2)
Prothrombin Time: 20.9 seconds — ABNORMAL HIGH (ref 11.4–15.2)

## 2020-08-27 LAB — C-REACTIVE PROTEIN: CRP: 12 mg/dL — ABNORMAL HIGH (ref <1.0)

## 2020-08-27 LAB — AMMONIA: Ammonia: 61 umol/L — ABNORMAL HIGH (ref 9–35)

## 2020-08-27 LAB — FERRITIN: Ferritin: 1257 ng/mL — ABNORMAL HIGH (ref 24–336)

## 2020-08-27 LAB — MAGNESIUM: Magnesium: 3.1 mg/dL — ABNORMAL HIGH (ref 1.7–2.4)

## 2020-08-27 LAB — PHOSPHORUS: Phosphorus: 4.1 mg/dL (ref 2.5–4.6)

## 2020-08-27 LAB — LACTIC ACID, PLASMA: Lactic Acid, Venous: 1.5 mmol/L (ref 0.5–1.9)

## 2020-08-27 LAB — D-DIMER, QUANTITATIVE: D-Dimer, Quant: 2.28 ug/mL-FEU — ABNORMAL HIGH (ref 0.00–0.50)

## 2020-08-27 MED ORDER — PANTOPRAZOLE SODIUM 40 MG PO TBEC
40.0000 mg | DELAYED_RELEASE_TABLET | Freq: Two times a day (BID) | ORAL | Status: DC
  Administered 2020-08-27 – 2020-08-29 (×4): 40 mg via ORAL
  Filled 2020-08-27 (×4): qty 1

## 2020-08-27 NOTE — Progress Notes (Signed)
PROGRESS NOTE  [name] SNK:[id number] DOB: 1986-03-20 DOA: 08/22/2020 PCP: [name], PA  HPI/Recap of past 24 hours:  Per PFX:[id number] V Bridgesis a 35 y.o.malewithhistory of alcohol use disorder, hypertension, depression, left hip avascular necrosis on Robaxin and tramadol presented to the hospital with epistaxis and throwing up  blood 24 hours prior to presentation.  Patient had been on Voltaren as well. In the ER , patient was initially noted to be hypotensive and received 2 L of normal saline.  Creatinine was significantly elevated at 3.4 with baseline creatinine of around 0.9.  LFTs were elevated with thrombocytopenia.  Patient also had leukocytosis.  Right upper quadrant ultrasound showed features concerning for  hepatic steatosis. No definite evidence of cirrhosis. He tested positive for Covid infection and well.Patient was then admitted for acute renal failure with thrombocytopenia, epistaxis, acute hepatitis and Covid infection.  During hospitalization, patient was seen by GI.  Received 2 units of platelets with improvement of bleeding.  Patient has been started on IV Protonix as well.  GI has recommended steroid on discharge and has signed off again. Patient does have impaired renal function and nephrology on board.  Subjective: August 27, 2020: Patient seen and examined at bedside. Nurse reported that he was agitated last night and he had to give him medication to calm him down he was in withdrawal. But today he is alert oriented she is knows where he is.  Assessment/Plan: Principal Problem:   ARF (acute renal failure) (HCC) Active Problems:   Alcohol dependence (HCC)   Thrombocytopenia (HCC)   Alcoholic hepatitis   Hypertension   COVID-19 virus infection   Nonoliguric acute kidney injury-  This is likely multifactorial from none NSAIDs at home including lisinopril chlorothiazide in addition to poor oral intake from Covid infection also possible  hepatorenal syndrome.  Patient received IV fluid and is on IV albumin.  Renal ultrasound did not show any evidence of obstruction.  Neurology has been consulted and on board he is not a candidate for octreotide or midodrine due to normal blood pressure.  He is also not a good candidate for renal replacement due to liver dysfunction nor is he a candidate for liver transplant    Recent Labs       Lab Results  Component Value Date   CREATININE 2.28 (H) 08/26/2020   CREATININE 2.92 (H) 08/25/2020   CREATININE 3.57 (H) 08/24/2020      Severe thrombocytopenia with epistaxis on presentation.  Thought to be due to alcohol liver disease and Covid infection.  His hepatitis and HIV are negative.  He received 2 units of platelets on August 23, 2020 with resolution of the bleeding.  He is platelet are trending up.  He is on steroids we will continue that for now.   CBC Latest Ref Rng & Units 08/26/2020 08/25/2020 08/24/2020  WBC 4.0 - 10.5 K/uL 20.2(H) 18.6(H) 22.5(H)  Hemoglobin 13.0 - 17.0 g/dL 3.5(H) 2.9(J) 2.4(Q)  Hematocrit 39.0 - 52.0 % 26.0(L) 26.9(L) 24.9(L)  Platelets 150 - 400 K/uL 84(L) 71(L) 57(L)    Hyponatremia.  Likely due to alcohol use beer proteinemia.  This is improved with IV fluid   COVID-19 infection acute hypoxic respiratory failure.  Patient is still on O2 2 L/min nasal cannula his saturation is stable.  He will continue and complete remdesivir as well as Solu-Medrol   COVID-19 Labs  Recent Labs (last 2 labs)        Recent Labs    08/24/20 0420  08/25/20 0349 08/26/20 0351  DDIMER 3.79* 6.11* 3.37*  FERRITIN 1,186* 1,147* 1,195*  CRP 26.1* 29.4* 18.1*      Recent Labs       Lab Results  Component Value Date   SARSCOV2NAA POSITIVE (A) 08/22/2020   SARSCOV2NAA DETECTED (A) 02/24/2019     Alcoholic hepatitis with jaundice His jaundice is slightly improved.  GI following-seen by GI for follow-up today. GI has recommended prednisolone 40 mg  daily for 4 weeks then taper after completion of IV Solu-Medrol while in the hospital.  Overall prognosis of the patient is guarded. Not a candidate for liver transplant due to ongoing alcohol usage. We will continue to trend LFTs  Acute blood loss anemia secondary to epistaxis/hematemesis.    Epistaxis and hematemesis have resolved, he is currently of aspirin he needs to avoid NSAIDs.  He was transfused with platelet.  We will continue to monitor and transfuse as needed    Leukocytosis likely likely due to steroid use or reactive is currently on steroid for Covid.  He is on Rocephin for spontaneous bacterial peritonitis is prophylaxis on Rocephin for SBP prophylaxis. Continue steroids for now.  CBC Latest Ref Rng & Units 08/26/2020 08/25/2020 08/24/2020  WBC 4.0 - 10.5 K/uL 20.2(H) 18.6(H) 22.5(H)  Hemoglobin 13.0 - 17.0 g/dL 4.0(J) 8.1(X) 9.1(Y)  Hematocrit 39.0 - 52.0 % 26.0(L) 26.9(L) 24.9(L)  Platelets 150 - 400 K/uL 84(L) 71(L) 57(L)    History of hypertension  Lisinopril and hydrochlorothiazide are on hold due to AKI   Alcohol abuse with withdrawal. Patient  Patient is currently on CIWA protocol as well as still a sitter.  He has been calm today.  Continue lactulose     History of sleep apnea.  patient is supposed to have a sleep study on the outside this will be done as outpatient    Anxiety.  patient has anxiety at baseline and is currently on CIWA protocol with Ativan taper.  We will continue with that    Erythematous skin rash..  Improved this may be part of Covid viral rash   Subconjunctival hemorrhage on both sclera. This most likely from his still vomiting. He denies any visual loss  DVT prophylaxis: SCDs Start: 08/22/20 2135.  Code Status: Full code  Family Communication: none   Status is: Inpatient  Remains inpatient appropriate because: IV treatments appropriate due to intensity of illness or inability to take PO, Inpatient level of care  appropriate due to severity of illness, acute kidney injury, hypoxic respiratory failure, alcoholic hepatitis,  Dispo: The patient is from: Home  Anticipated d/c is to: Home  Anticipated d/c date is: 3 days or more  Patient currently is not medically stable to d/c.              Difficult to place patient No  Consultants:  Nephrology  GI  Procedures:  Platelet transfusion 2 units on 08/23/2020  Anti-infectives:   Rocephin IV 2/1>  Remdesivir 2/1>    Objective: Vitals:   08/26/20 1441 08/26/20 2158 08/27/20 0500 08/27/20 0548  BP: (!) 159/102 (!) 164/99  (!) 153/101  Pulse: 95 96  92  Resp: 19 20  20   Temp: 97.6 F (36.4 C) 99.2 F (37.3 C)  97.9 F (36.6 C)  TempSrc: Oral Oral  Oral  SpO2: 96% 97%  94%  Weight:   85.2 kg     Intake/Output Summary (Last 24 hours) at 08/27/2020 0845 Last data filed at 08/27/2020 0500 Gross per 24 hour  Intake  472 ml  Output 3100 ml  Net -2628 ml   Filed Weights   08/25/20 0500 08/26/20 0334 08/27/20 0500  Weight: 95.1 kg 87.3 kg 85.2 kg   Body mass index is 26.95 kg/m.  Exam:  . General: 35 y.o. year-old male well developed well nourished in no acute distress.  Alert and oriented x3. Marland Kitchen HEENT: Jaundiced conjunctiva. He also has subconjunctival hemorrhage on both eyes the right more than the left . Cardiovascular: Regular rate and rhythm with no rubs or gallops.  No thyromegaly or JVD noted.   Marland Kitchen Respiratory: Clear to auscultation with no wheezes or rales. Good inspiratory effort. . Abdomen: Soft nontender nondistended with normal bowel sounds x4 quadrants. . Musculoskeletal: No lower extremity edema. 2/4 pulses in all 4 extremities. . Skin: No ulcerative lesions noted or rashes, jaundiced . Psychiatry: Mood is appropriate for condition and setting    Data Reviewed: CBC: Recent Labs  Lab 08/23/20 1103 08/23/20 1259 08/24/20 0750 08/25/20 1445 08/26/20 0351 08/27/20 0454  WBC  16.8* 21.5* 22.5* 18.6* 20.2* 20.5*  NEUTROABS 15.5* 19.0* 18.4*  --  18.0* 18.2*  HGB 10.5* 10.7* 9.1* 9.8* 9.5* 9.8*  HCT 29.5* 29.4* 24.9* 26.9* 26.0* 27.3*  MCV 98.3 97.0 98.0 95.7 95.9 97.5  PLT 34* 32* 57* 71* 84* 84*   Basic Metabolic Panel: Recent Labs  Lab 08/23/20 1500 08/24/20 0420 08/25/20 0349 08/26/20 0351 08/27/20 0454  NA 126* 126* 129* 141 138  K 3.8 4.2 4.5 3.9 3.7  CL 94* 92* 94* 106 107  CO2 14* 16* 16* 19* 20*  GLUCOSE 124* 102* 165* 190* 156*  BUN 120* 134* 130* 87* 81*  CREATININE 3.26* 3.57* 2.92* 2.28* 1.33*  CALCIUM 7.0* 7.4* 7.4* 7.8* 8.0*  MG  --  2.5* 3.1* 3.3* 3.1*  PHOS  --   --  8.7*  --  4.1   GFR: CrCl cannot be calculated (Unknown ideal weight.). Liver Function Tests: Recent Labs  Lab 08/23/20 0426 08/24/20 0420 08/25/20 0349 08/26/20 0351 08/27/20 0454  AST 195* 172* 193* 187* 252*  ALT 59* 51* 57* 63* 102*  ALKPHOS 194* 261* 308* 301* 297*  BILITOT 8.9* 10.2* 10.7* 8.6* 8.1*  PROT 6.4* 6.4* 7.0 7.6 7.5  ALBUMIN 2.3* 2.5* 2.9* 3.5 3.5   Recent Labs  Lab 08/23/20 1500  LIPASE 99*   Recent Labs  Lab 08/22/20 1817 08/25/20 0349 08/27/20 0454  AMMONIA 44* 68* 61*   Coagulation Profile: Recent Labs  Lab 08/22/20 1817 08/25/20 0349 08/26/20 0351 08/27/20 0454  INR 1.4* 1.8* 2.0* 1.9*   Cardiac Enzymes: Recent Labs  Lab 08/23/20 1500  CKTOTAL 47*   BNP (last 3 results) No results for input(s): PROBNP in the last 8760 hours. HbA1C: No results for input(s): HGBA1C in the last 72 hours. CBG: No results for input(s): GLUCAP in the last 168 hours. Lipid Profile: No results for input(s): CHOL, HDL, LDLCALC, TRIG, CHOLHDL, LDLDIRECT in the last 72 hours. Thyroid Function Tests: No results for input(s): TSH, T4TOTAL, FREET4, T3FREE, THYROIDAB in the last 72 hours. Anemia Panel: Recent Labs    08/26/20 0351 08/27/20 0454  FERRITIN 1,195* 1,257*   Urine analysis:    Component Value Date/Time   COLORURINE  AMBER (A) 08/23/2020 1250   APPEARANCEUR TURBID (A) 08/23/2020 1250   LABSPEC 1.019 08/23/2020 1250   PHURINE 5.0 08/23/2020 1250   GLUCOSEU NEGATIVE 08/23/2020 1250   HGBUR NEGATIVE 08/23/2020 1250   BILIRUBINUR SMALL (A) 08/23/2020 1250   KETONESUR NEGATIVE  08/23/2020 1250   PROTEINUR 30 (A) 08/23/2020 1250   UROBILINOGEN 0.2 07/18/2011 0413   NITRITE NEGATIVE 08/23/2020 1250   LEUKOCYTESUR NEGATIVE 08/23/2020 1250   Sepsis Labs: @LABRCNTIP (procalcitonin:4,lacticidven:4)  ) Recent Results (from the past 240 hour(s))  SARS Coronavirus 2 by RT PCR (hospital order, performed in [facility] hospital lab) Nasopharyngeal Nasopharyngeal Swab     Status: Abnormal   Collection Time: 08/22/20  7:18 PM   Specimen: Nasopharyngeal Swab  Result Value Ref Range Status   SARS Coronavirus 2 POSITIVE (A) NEGATIVE Final    Comment: RESULT CALLED TO, READ BACK BY AND VERIFIED WITH: ZULETA K. 1.31.22 @ 2100 BY MECIAL J. (NOTE) SARS-CoV-2 target nucleic acids are DETECTED  SARS-CoV-2 RNA is generally detectable in upper respiratory specimens  during the acute phase of infection.  Positive results are indicative  of the presence of the identified virus, but do not rule out bacterial infection or co-infection with other pathogens not detected by the test.  Clinical correlation with patient history and  other diagnostic information is necessary to determine patient infection status.  The expected result is negative.  Fact Sheet for Patients:   02-06-2000   Fact Sheet for Healthcare Providers:   BoilerBrush.com.cy    This test is not yet approved or cleared by the https://pope.com/ FDA and  has been authorized for detection and/or diagnosis of SARS-CoV-2 by FDA under an Emergency Use Authorization (EUA).  This EUA will remain in effect (meaning this  test can be used) for the duration of  the COVID-19 declaration under Section 564(b)(1) of the  Act, 21 U.S.C. section 360-bbb-3(b)(1), unless the authorization is terminated or revoked sooner.  Performed at [facility], [address] [place], [place]       Studies: No results found.  Scheduled Meds: . carvedilol  3.125 mg Oral BID WC  . folic acid  1 mg Oral Daily  . lactulose  10 g Oral BID  . LORazepam  0-4 mg Oral Q6H   Followed by  . [START ON 08/28/2020] LORazepam  0-4 mg Oral Q12H  . methylPREDNISolone (SOLU-MEDROL) injection  60 mg Intravenous Q12H  . multivitamin with minerals  1 tablet Oral Daily  . pantoprazole  40 mg Intravenous Q12H  . thiamine  100 mg Oral Daily   Or  . thiamine  100 mg Intravenous Daily    Continuous Infusions: . albumin human 25 g (08/26/20 2225)  . cefTRIAXone (ROCEPHIN)  IV 2 g (08/27/20 0558)  . remdesivir 100 mg in NS 100 mL 100 mg (08/26/20 1455)     LOS: 5 days     10/24/20, MD [facility]  To reach me or the doctor on call, go to: [email or web address] Password [email or web address]  08/27/2020, 8:45 AM

## 2020-08-27 NOTE — Evaluation (Signed)
Physical Therapy Evaluation Patient Details Name: [name] MRN: [id number] DOB: 10-Apr-1986 Today's Date: 08/27/2020   History of Present Illness  [name] is a 35 y.o. male with history of alcohol use disorder, hypertension, depression, left hip avascular necrosis on Robaxin and tramadol presented to the hospital with epistaxis and throwing up  blood 24 hours prior to presentation.  Patient admitted for acute renal failure with thrombocytopenia, epistaxis, acute hepatitis and Covid infection.  Clinical Impression  Patient presents with decreased mobility due to pain, limited activity tolerance, decreased balance, decreased safety awareness and will benefit from skilled PT in the acute setting to allow return home with girlfriend support at d/c.  Feel he may need follow up HHPT and discussed equipment needs with girlfriend over face time.     Follow Up Recommendations Home health PT    Equipment Recommendations  Rolling walker with 5" wheels;3in1 (PT)    Recommendations for Other Services       Precautions / Restrictions Precautions Precautions: Fall      Mobility  Bed Mobility Overal bed mobility: Needs Assistance Bed Mobility: Supine to Sit     Supine to sit: Min assist;HOB elevated     General bed mobility comments: cues for technique to use R leg to assist to scoot and lifting help for trunk    Transfers Overall transfer level: Needs assistance Equipment used: Rolling walker (2 wheeled) Transfers: Sit to/from Stand Sit to Stand: Min guard         General transfer comment: slow to rise and effortful but only steadying help needed  Ambulation/Gait Ambulation/Gait assistance: Supervision Gait Distance (Feet): 20 Feet (x 2) Assistive device: Rolling walker (2 wheeled) Gait Pattern/deviations: Step-to pattern;Decreased stride length;Shuffle     General Gait Details: Patient with incontinence of bowel loose stool upon standing.  Assisted to bathroom  and to clean pt, linen, floor, etc.  Patient assisted back to recliner and left in chair with RN aware.  Stairs            Wheelchair Mobility    Modified Rankin (Stroke Patients Only)       Balance Overall balance assessment: Needs assistance Sitting-balance support: Feet supported Sitting balance-Leahy Scale: Good     Standing balance support: Bilateral upper extremity supported Standing balance-Leahy Scale: Fair Standing balance comment: can stand without UE support, but needs RW for ambulation due to L hip pain                             Pertinent Vitals/Pain Pain Assessment: 0-10 Pain Score: 5  Pain Location: L hip Pain Descriptors / Indicators: Sharp Pain Intervention(s): Monitored during session;Repositioned    Home Living Family/patient expects to be discharged to:: Private residence   Available Help at Discharge: Family Type of Home: House Home Access: Stairs to enter   [employer] of Steps: 2 in front, 5 in back, no rails in bac Home Layout: One level Home Equipment: Cane - single point;Crutches      Prior Function Level of Independence: Independent with assistive device(s)         Comments: using cane, then progressed to crutches recently due to hip pain     Hand Dominance        Extremity/Trunk Assessment   Upper Extremity Assessment Upper Extremity Assessment: Generalized weakness    Lower Extremity Assessment Lower Extremity Assessment: LLE deficits/detail LLE Deficits / Details: unable to lift unaided, painful with  movement       Communication   Communication: No difficulties  Cognition Arousal/Alertness: Awake/alert Behavior During Therapy: WFL for tasks assessed/performed Overall Cognitive Status: Impaired/Different from baseline Area of Impairment: Orientation;Memory;Problem solving                 Orientation Level: Disoriented to;Time   Memory: Decreased short-term memory        Problem Solving: Slow processing;Requires verbal cues General Comments: face time with wife initially and she relayed information about falls at home he did not recollect.      General Comments General comments (skin integrity, edema, etc.): on RA, SpO2 90% or greater, but SOB at times    Exercises     Assessment/Plan    PT Assessment Patient needs continued PT services  PT Problem List Decreased strength;Decreased mobility;Decreased safety awareness;Decreased activity tolerance;Decreased cognition;Cardiopulmonary status limiting activity;Decreased knowledge of use of DME;Decreased balance;Pain       PT Treatment Interventions DME instruction;Therapeutic activities;Gait training;Therapeutic exercise;Balance training;Functional mobility training;Patient/family education    PT Goals (Current goals can be found in the Care Plan section)  Acute Rehab PT Goals Patient Stated Goal: to go home asap PT Goal Formulation: With patient/family Time For Goal Achievement: 09/10/20 Potential to Achieve Goals: Good    Frequency Min 3X/week   Barriers to discharge        Co-evaluation               AM-PAC PT "6 Clicks" Mobility  Outcome Measure Help needed turning from your back to your side while in a flat bed without using bedrails?: A Little Help needed moving from lying on your back to sitting on the side of a flat bed without using bedrails?: A Little Help needed moving to and from a bed to a chair (including a wheelchair)?: A Little Help needed standing up from a chair using your arms (e.g., wheelchair or bedside chair)?: A Little Help needed to walk in hospital room?: A Little Help needed climbing 3-5 steps with a railing? : A Little 6 Click Score: 18    End of Session   Activity Tolerance: Patient limited by pain Patient left: in chair;with call bell/phone within reach Nurse Communication: Mobility status;Patient requests pain meds PT Visit Diagnosis: Other  abnormalities of gait and mobility (R26.89);Muscle weakness (generalized) (M62.81);Other symptoms and signs involving the nervous system (R29.898);Pain Pain - Right/Left: Left Pain - part of body: Hip    Time: 2947-6546 PT Time Calculation (min) (ACUTE ONLY): 57 min   Charges:   PT Evaluation $PT Eval Moderate Complexity: 1 Mod PT Treatments $Gait Training: 8-22 mins $Therapeutic Activity: 23-37 mins        Sheran Lawless, PT Acute Rehabilitation Services [phone] Office:[phone] 08/27/2020   Elray Mcgregor 08/27/2020, 2:42 PM

## 2020-08-27 NOTE — Progress Notes (Signed)
Patient ID: RIGGIN CUTTINO, male   DOB: May 17, 1986, 35 y.o.   MRN: [id number] S: No events overnight.  Good UOP and improvement of BUN/Cr O:BP (!) 155/103   Pulse 97   Temp 97.9 F (36.6 C) (Oral)   Resp 20   Wt 85.2 kg   SpO2 94%   BMI 26.95 kg/m   Intake/Output Summary (Last 24 hours) at 08/27/2020 1331 Last data filed at 08/27/2020 0500 Gross per 24 hour  Intake 236 ml  Output 2150 ml  Net -1914 ml   Intake/Output: I/O last 3 completed shifts: In: 472 [P.O.:472] Out: 4575 [Urine:4575]  Intake/Output this shift:  No intake/output data recorded. Weight change: -2.1 kg Physical exam: unable to complete due to COVID + status.  In order to preserve PPE equipment and to minimize exposure to providers.  Notes from other caregivers reviewed   Recent Labs  Lab 08/22/20 1817 08/23/20 0426 08/23/20 1500 08/24/20 0420 08/25/20 0349 08/26/20 0351 08/27/20 0454  NA 124* 125* 126* 126* 129* 141 138  K 4.4 3.7 3.8 4.2 4.5 3.9 3.7  CL 88* 92* 94* 92* 94* 106 107  CO2 20* 17* 14* 16* 16* 19* 20*  GLUCOSE 148* 96 124* 102* 165* 190* 156*  BUN 120* 131* 120* 134* 130* 87* 81*  CREATININE 3.44* 3.41* 3.26* 3.57* 2.92* 2.28* 1.33*  ALBUMIN 2.3* 2.3*  --  2.5* 2.9* 3.5 3.5  CALCIUM 8.2* 7.4* 7.0* 7.4* 7.4* 7.8* 8.0*  PHOS  --   --   --   --  8.7*  --  4.1  AST 271* 195*  --  172* 193* 187* 252*  ALT 76* 59*  --  51* 57* 63* 102*   Liver Function Tests: Recent Labs  Lab 08/25/20 0349 08/26/20 0351 08/27/20 0454  AST 193* 187* 252*  ALT 57* 63* 102*  ALKPHOS 308* 301* 297*  BILITOT 10.7* 8.6* 8.1*  PROT 7.0 7.6 7.5  ALBUMIN 2.9* 3.5 3.5   Recent Labs  Lab 08/23/20 1500  LIPASE 99*   Recent Labs  Lab 08/22/20 1817 08/25/20 0349 08/27/20 0454  AMMONIA 44* 68* 61*   CBC: Recent Labs  Lab 08/23/20 1259 08/24/20 0750 08/25/20 1445 08/26/20 0351 08/27/20 0454  WBC 21.5* 22.5* 18.6* 20.2* 20.5*  NEUTROABS 19.0* 18.4*  --  18.0* 18.2*  HGB 10.7* 9.1* 9.8* 9.5*  9.8*  HCT 29.4* 24.9* 26.9* 26.0* 27.3*  MCV 97.0 98.0 95.7 95.9 97.5  PLT 32* 57* 71* 84* 84*   Cardiac Enzymes: Recent Labs  Lab 08/23/20 1500  CKTOTAL 47*   CBG: No results for input(s): GLUCAP in the last 168 hours.  Iron Studies:  Recent Labs    08/27/20 0454  FERRITIN 1,257*   Studies/Results: DG CHEST PORT 1 VIEW  Result Date: 08/25/2020 CLINICAL DATA:  Hypoxia EXAM: PORTABLE CHEST 1 VIEW COMPARISON:  August 22, 2020 FINDINGS: The cardiomediastinal silhouette is unchanged and enlarged in contour.Low lung volumes. No pleural effusion. No pneumothorax. Diffuse interstitial prominence and peribronchial cuffing. Visualized abdomen is unremarkable. No acute osseous abnormality. IMPRESSION: Constellation of findings may reflect underlying pulmonary edema. Atypical infection could present similarly. Electronically Signed   By: Meda Klinefelter MD   On: 08/25/2020 14:52   ECHOCARDIOGRAM LIMITED  Result Date: 08/25/2020    ECHOCARDIOGRAM LIMITED REPORT   Patient Name:   NILAY MANGRUM Date of Exam: 08/25/2020 Medical Rec #:  [id number]         Height:       70.0  in Accession #:    [id number]        Weight:       209.7 lb Date of Birth:  [birth year]         BSA:          2.130 m Patient Age:    34 years          BP:           146/87 mmHg Patient Gender: M                 HR:           98 bpm. Exam Location:  Inpatient Procedure: Limited Echo, Cardiac Doppler, Color Doppler and Intracardiac            Opacification Agent Indications:    Dyspnea R06.00  History:        Patient has no prior history of Echocardiogram examinations.                 Risk Factors:Hypertension and Current Smoker.  Sonographer:    Renella Cunas RDCS Referring Phys: [id number] [facility] [place]  Sonographer Comments: Covid positive. IMPRESSIONS  1. DIfficult acoustic window even with Definity . Left ventricular ejection fraction, by estimation, is 55 to 60%. The left ventricle has normal function. The left ventricular internal  cavity size was moderately dilated. Left ventricular diastolic parameters are indeterminate.  2. Right ventricular systolic function is normal. The right ventricular size is normal.  3. The mitral valve is abnormal. Trivial mitral valve regurgitation.  4. The aortic valve is normal in structure. Aortic valve regurgitation is not visualized. FINDINGS  Left Ventricle: DIfficult acoustic window even with Definity. Left ventricular ejection fraction, by estimation, is 55 to 60%. The left ventricle has normal function. Definity contrast agent was given IV to delineate the left ventricular endocardial borders. The left ventricular internal cavity size was moderately dilated. There is no left ventricular hypertrophy. Left ventricular diastolic parameters are indeterminate. Right Ventricle: The right ventricular size is normal. Right vetricular wall thickness was not assessed. Right ventricular systolic function is normal. Left Atrium: Left atrial size was normal in size. Right Atrium: Right atrial size was normal in size. Mitral Valve: The mitral valve is abnormal. There is mild thickening of the mitral valve leaflet(s). Trivial mitral valve regurgitation. Tricuspid Valve: The tricuspid valve is normal in structure. Tricuspid valve regurgitation is trivial. Aortic Valve: The aortic valve is normal in structure. Aortic valve regurgitation is not visualized. Pulmonic Valve: The pulmonic valve was normal in structure. Pulmonic valve regurgitation is not visualized. LEFT VENTRICLE PLAX 2D LVIDd:         5.70 cm LVIDs:         3.90 cm LV PW:         0.80 cm LV IVS:        0.80 cm LVOT diam:     2.40 cm LV SV:         82 LV SV Index:   39 LVOT Area:     4.52 cm  LEFT ATRIUM         Index LA diam:    4.10 cm 1.93 cm/m  AORTIC VALVE LVOT Vmax:   104.00 cm/s LVOT Vmean:  72.000 cm/s LVOT VTI:    0.182 m  AORTA Ao Root diam: 3.50 cm  SHUNTS Systemic VTI:  0.18 m Systemic Diam: 2.40 cm Dietrich Pates MD Electronically signed by Dietrich Pates MD Signature Date/Time: 08/25/2020/7:52:57 PM  Final    . carvedilol  3.125 mg Oral BID WC  . folic acid  1 mg Oral Daily  . lactulose  10 g Oral BID  . LORazepam  0-4 mg Oral Q6H   Followed by  . [START ON 08/28/2020] LORazepam  0-4 mg Oral Q12H  . methylPREDNISolone (SOLU-MEDROL) injection  60 mg Intravenous Q12H  . multivitamin with minerals  1 tablet Oral Daily  . pantoprazole  40 mg Oral BID  . thiamine  100 mg Oral Daily    BMET    Component Value Date/Time   NA 138 08/27/2020 0454   K 3.7 08/27/2020 0454   CL 107 08/27/2020 0454   CO2 20 (L) 08/27/2020 0454   GLUCOSE 156 (H) 08/27/2020 0454   BUN 81 (H) 08/27/2020 0454   CREATININE 1.33 (H) 08/27/2020 0454   CALCIUM 8.0 (L) 08/27/2020 0454   GFRNONAA >60 08/27/2020 0454   GFRAA >60 02/24/2019 1520   CBC    Component Value Date/Time   WBC 20.5 (H) 08/27/2020 0454   RBC 2.80 (L) 08/27/2020 0454   HGB 9.8 (L) 08/27/2020 0454   HCT 27.3 (L) 08/27/2020 0454   PLT 84 (L) 08/27/2020 0454   MCV 97.5 08/27/2020 0454   MCH 35.0 (H) 08/27/2020 0454   MCHC 35.9 08/27/2020 0454   RDW 15.2 08/27/2020 0454   LYMPHSABS 1.0 08/27/2020 0454   MONOABS 0.7 08/27/2020 0454   EOSABS 0.0 08/27/2020 0454   BASOSABS 0.0 08/27/2020 0454     Assessment/Plan: 1. AKI, non-oliguric- likely multifactorial with NSAIDs and concomitant ACE/HCTZ, poor po intake, 3rd spacing/liver disease, and covid infection. Starting to make some urine which is very dark and thick. No cirrhosis or ascites seen on [place].  Urine Na <10 consistent with pre-renal azotemia, or possible hepatorenal syndrome.  Renal US without obstruction.  Anti-dsDNA neg. Complement ok. ASO elevated at 226. MPO and PR3 neg. CK ok. 1. Continue to hold ACE/HCTZ and NSAIDs 2. No midodrine or octreotide due to improved BP 3. Continue albumin for now.  Defer IV fluids. 4. Noted ASO positive; complement is normal. He is on solumedrol.  ANA pending. Other serologic work-up neg   5. No indication for dialysis at this time. Unfortunately cannot perform renal biopsy due to severe thrombocytopenia. 6. He is not a candidate for RRT since this is likely related to his alcoholic hepatitis and is not a candidate for liver transplant since he was actively drinking before admission. 7. Thankfully his renal function continues to improve.  Nothing further to add and will sign off.  Call with questions or concerns.   8. Would not resume ACE or NSAIDs as risks outweigh any benefits and no need for outpatient follow up if renal function returns to baseline. 2. Epistaxis/hematemasis - GI following and suspect is from oropharynx due to thrombocytopenia, however may have gastritis due to NSAID use. GI signed off, however may need EGD if bleeding persists. 3. Alcoholic hepatitis- with worsening LFT's and INR, but bilirubin is starting to decrease.- h/o daily alcohol use as well as tylenol. He is not a candidate for liver transplant.  His overall condition is worsening and recommend palliative care consult to help set goals/limits of care.  1. Prednisone per GI 4. Thrombocytopenia- presumably due to Etoh and liver disease, however also on NSAIDs.s/p platelet transfusion  5. covid-19 infection- per primary team on IV steroids.  Thankfully renal function is improving however remains quite ill.  Would involve palliative care if they have  not seen   Irena Cords, MD [employer] [phone]

## 2020-08-27 NOTE — Progress Notes (Signed)

## 2020-08-28 LAB — COMPREHENSIVE METABOLIC PANEL
ALT: 123 U/L — ABNORMAL HIGH (ref 0–44)
AST: 191 U/L — ABNORMAL HIGH (ref 15–41)
Albumin: 3.2 g/dL — ABNORMAL LOW (ref 3.5–5.0)
Alkaline Phosphatase: 274 U/L — ABNORMAL HIGH (ref 38–126)
Anion gap: 14 (ref 5–15)
BUN: 61 mg/dL — ABNORMAL HIGH (ref 6–20)
CO2: 21 mmol/L — ABNORMAL LOW (ref 22–32)
Calcium: 8.2 mg/dL — ABNORMAL LOW (ref 8.9–10.3)
Chloride: 104 mmol/L (ref 98–111)
Creatinine, Ser: 1.08 mg/dL (ref 0.61–1.24)
GFR, Estimated: >60 mL/min (ref >60)
Glucose, Bld: 160 mg/dL — ABNORMAL HIGH (ref 70–99)
Potassium: 3.9 mmol/L (ref 3.5–5.1)
Sodium: 139 mmol/L (ref 135–145)
Total Bilirubin: 6.5 mg/dL — ABNORMAL HIGH (ref 0.3–1.2)
Total Protein: 6.7 g/dL (ref 6.5–8.1)

## 2020-08-28 LAB — CBC WITH DIFFERENTIAL/PLATELET
Abs Immature Granulocytes: 0.41 10*3/uL — ABNORMAL HIGH (ref 0.00–0.07)
Basophils Absolute: 0 10*3/uL (ref 0.0–0.1)
Basophils Relative: 0 %
Eosinophils Absolute: 0 10*3/uL (ref 0.0–0.5)
Eosinophils Relative: 0 %
HCT: 25.5 % — ABNORMAL LOW (ref 39.0–52.0)
Hemoglobin: 9.4 g/dL — ABNORMAL LOW (ref 13.0–17.0)
Immature Granulocytes: 2 %
Lymphocytes Relative: 5 %
Lymphs Abs: 1 10*3/uL (ref 0.7–4.0)
MCH: 35.3 pg — ABNORMAL HIGH (ref 26.0–34.0)
MCHC: 36.9 g/dL — ABNORMAL HIGH (ref 30.0–36.0)
MCV: 95.9 fL (ref 80.0–100.0)
Monocytes Absolute: 0.7 10*3/uL (ref 0.1–1.0)
Monocytes Relative: 3 %
Neutro Abs: 17.3 10*3/uL — ABNORMAL HIGH (ref 1.7–7.7)
Neutrophils Relative %: 90 %
Platelets: 95 10*3/uL — ABNORMAL LOW (ref 150–400)
RBC: 2.66 MIL/uL — ABNORMAL LOW (ref 4.22–5.81)
RDW: 15.5 % (ref 11.5–15.5)
WBC: 19.4 10*3/uL — ABNORMAL HIGH (ref 4.0–10.5)
nRBC: 0.1 % (ref 0.0–0.2)

## 2020-08-28 LAB — MAGNESIUM: Magnesium: 2.4 mg/dL (ref 1.7–2.4)

## 2020-08-28 LAB — FERRITIN: Ferritin: 1128 ng/mL — ABNORMAL HIGH (ref 24–336)

## 2020-08-28 NOTE — Plan of Care (Signed)

## 2020-08-28 NOTE — Progress Notes (Addendum)
PROGRESS NOTE  [name] JSH:[id number] DOB: [birth year] DOA: 08/22/2020 PCP: [name], PA  HPI/Recap of past 24 hours:  Per IFO:YDXAJOI V Bridgesis a 35 y.o.malewithhistory of alcohol use disorder, hypertension, depression, left hip avascular necrosis on Robaxin and tramadol presented to the hospital with epistaxis and throwing up  blood 24 hours prior to presentation.  Patient had been on Voltaren as well. In the ER , patient was initially noted to be hypotensive and received 2 L of normal saline.  Creatinine was significantly elevated at 3.4 with baseline creatinine of around 0.9.  LFTs were elevated with thrombocytopenia.  Patient also had leukocytosis.  Right upper quadrant ultrasound showed features concerning for  hepatic steatosis. No definite evidence of cirrhosis. He tested positive for Covid infection and well.Patient was then admitted for acute renal failure with thrombocytopenia, epistaxis, acute hepatitis and Covid infection.  During hospitalization, patient was seen by GI.  Received 2 units of platelets with improvement of bleeding.  Patient has been started on IV Protonix as well.  GI has recommended steroid on discharge and has signed off again. Patient does have impaired renal function and nephrology on board.  Subjective: August 28, 2020 Patient seen and examined at bedside he is doing much better denies any agitation slightly anxious still and from his chronic anxiety his CIWA protocol is 102 because of the anxiety. His jaundice seems to be improving as well August 27, 2020: Patient seen and examined at bedside. Nurse reported that he was agitated last night and he had to give him medication to calm him down he was in withdrawal. But today he is alert oriented she is knows where he is.  Assessment/Plan: Principal Problem:   ARF (acute renal failure) (HCC) Active Problems:   Alcohol dependence (HCC)   Thrombocytopenia (HCC)   Alcoholic hepatitis    Hypertension   COVID-19 virus infection   Nonoliguric acute kidney injury-  This is likely multifactorial from none NSAIDs at home including lisinopril chlorothiazide in addition to poor oral intake from Covid infection also possible hepatorenal syndrome.  Patient received IV fluid and is on IV albumin.  Renal ultrasound did not show any evidence of obstruction.  Neurology has been consulted and on board he is not a candidate for octreotide or midodrine due to normal blood pressure.  He is also not a good candidate for renal replacement due to liver dysfunction nor is he a candidate for liver transplant    Recent Labs       Lab Results  Component Value Date   CREATININE 2.28 (H) 08/26/2020   CREATININE 2.92 (H) 08/25/2020   CREATININE 3.57 (H) 08/24/2020      Severe thrombocytopenia with epistaxis on presentation.  Thought to be due to alcohol liver disease and Covid infection.  His hepatitis and HIV are negative.  He received 2 units of platelets on August 23, 2020 with resolution of the bleeding.  He is platelet are trending up.  He is on steroids we will continue that for now.   CBC Latest Ref Rng & Units 08/26/2020 08/25/2020 08/24/2020  WBC 4.0 - 10.5 K/uL 20.2(H) 18.6(H) 22.5(H)  Hemoglobin 13.0 - 17.0 g/dL 7.8(M) 7.6(H) 2.0(N)  Hematocrit 39.0 - 52.0 % 26.0(L) 26.9(L) 24.9(L)  Platelets 150 - 400 K/uL 84(L) 71(L) 57(L)    Hyponatremia.  Likely due to alcohol use ,beer proteinemia.  This is improved with IV fluid   COVID-19 infection acute hypoxic respiratory failure.  Patient is still on O2 2  L/min nasal cannula his saturation is stable.  He will continue and complete remdesivir as well as Solu-Medrol   COVID-19 Labs  Recent Labs (last 2 labs)        Recent Labs    08/24/20 0420 08/25/20 0349 08/26/20 0351  DDIMER 3.79* 6.11* 3.37*  FERRITIN 1,186* 1,147* 1,195*  CRP 26.1* 29.4* 18.1*      Recent Labs       Lab Results  Component Value Date    SARSCOV2NAA POSITIVE (A) 08/22/2020   SARSCOV2NAA DETECTED (A) 02/24/2019     Alcoholic hepatitis with jaundice His jaundice is slightly improved.  GI following-seen by GI for follow-up today. GI has recommended prednisolone 40 mg daily for 4 weeks then taper after completion of IV Solu-Medrol while in the hospital.  Overall prognosis of the patient is guarded. Not a candidate for liver transplant due to ongoing alcohol usage. We will continue to trend LFTs.  He received albumin infusion but that was discontinued continue to monitor and restart if needed  Acute blood loss anemia secondary to epistaxis/hematemesis.    Epistaxis and hematemesis have resolved, he is currently of aspirin he needs to avoid NSAIDs.  He was transfused with platelet.  We will continue to monitor and transfuse as needed    Leukocytosis likely likely due to steroid use or reactive is currently on steroid for Covid.  He is on Rocephin for spontaneous bacterial peritonitis is prophylaxis on Rocephin for SBP prophylaxis. Continue steroids for now.  CBC Latest Ref Rng & Units 08/26/2020 08/25/2020 08/24/2020  WBC 4.0 - 10.5 K/uL 20.2(H) 18.6(H) 22.5(H)  Hemoglobin 13.0 - 17.0 g/dL 4.0(J) 8.1(X) 9.1(Y)  Hematocrit 39.0 - 52.0 % 26.0(L) 26.9(L) 24.9(L)  Platelets 150 - 400 K/uL 84(L) 71(L) 57(L)    History of hypertension  Lisinopril and hydrochlorothiazide are on hold due to AKI   Alcohol abuse with withdrawal. Patient  Patient is currently on CIWA protocol as well as still a sitter.  He has been calm today.  Continue lactulose     History of sleep apnea.  patient is supposed to have a sleep study on the outside this will be done as outpatient    Anxiety.  patient has anxiety at baseline and is currently on CIWA protocol with Ativan taper.  We will continue with that    Erythematous skin rash..  Improved this may be part of Covid viral rash   Subconjunctival hemorrhage on both sclera. This most  likely from his still vomiting. He denies any visual loss  DVT prophylaxis: SCDs Start: 08/22/20 2135.  Code Status: Full code  Family Communication:  [place] at bedside   status is: Inpatient  Remains inpatient appropriate because: IV treatments appropriate due to intensity of illness or inability to take PO, Inpatient level of care appropriate due to severity of illness, acute kidney injury, hypoxic respiratory failure, alcoholic hepatitis,  Dispo: The patient is from: Home  Anticipated d/c is to: Home  Anticipated d/c date is:1- 3 days   Patient currently is not medically stable to d/c.              Difficult to place patient No  Consultants:  Nephrology  GI  Procedures:  Platelet transfusion 2 units on 08/23/2020  Anti-infectives:   Rocephin IV 2/1>  Remdesivir 2/1>    Objective: Vitals:   08/27/20 2110 08/28/20 0535 08/28/20 0900 08/28/20 1310  BP: (!) 158/109 (!) 157/98  (!) 141/94  Pulse: 100 88  88  Resp: Marland Kitchen)  24 (!) 22  19  Temp: 97.6 F (36.4 C) 98.2 F (36.8 C)  98.9 F (37.2 C)  TempSrc: Oral Oral    SpO2: 97% 96% 95% 95%  Weight:  86.1 kg      Intake/Output Summary (Last 24 hours) at 08/28/2020 1917 Last data filed at 08/28/2020 1811 Gross per 24 hour  Intake 1200 ml  Output 3100 ml  Net -1900 ml   Filed Weights   08/26/20 0334 08/27/20 0500 08/28/20 0535  Weight: 87.3 kg 85.2 kg 86.1 kg   Body mass index is 27.24 kg/m.  Exam:  . General: 35 y.o. year-old male well developed well nourished in no acute distress.  Alert and oriented x3. Marland Kitchen HEENT: Jaundiced conjunctiva. He also has subconjunctival hemorrhage on both eyes the right more than the left jaundice present and is improving . Cardiovascular: Regular rate and rhythm with no rubs or gallops.  No thyromegaly or JVD noted.   Marland Kitchen Respiratory: Clear to auscultation with no wheezes or rales. Good inspiratory effort. . Abdomen: Soft nontender  nondistended with normal bowel sounds x4 quadrants. . Musculoskeletal: No lower extremity edema. 2/4 pulses in all 4 extremities. . Skin: No ulcerative lesions noted or rashes, jaundiced . Psychiatry: Mood is appropriate for condition and setting    Data Reviewed: CBC: Recent Labs  Lab 08/23/20 1259 08/24/20 0750 08/25/20 1445 08/26/20 0351 08/27/20 0454 08/28/20 0431  WBC 21.5* 22.5* 18.6* 20.2* 20.5* 19.4*  NEUTROABS 19.0* 18.4*  --  18.0* 18.2* 17.3*  HGB 10.7* 9.1* 9.8* 9.5* 9.8* 9.4*  HCT 29.4* 24.9* 26.9* 26.0* 27.3* 25.5*  MCV 97.0 98.0 95.7 95.9 97.5 95.9  PLT 32* 57* 71* 84* 84* 95*   Basic Metabolic Panel: Recent Labs  Lab 08/24/20 0420 08/25/20 0349 08/26/20 0351 08/27/20 0454 08/28/20 0431  NA 126* 129* 141 138 139  K 4.2 4.5 3.9 3.7 3.9  CL 92* 94* 106 107 104  CO2 16* 16* 19* 20* 21*  GLUCOSE 102* 165* 190* 156* 160*  BUN 134* 130* 87* 81* 61*  CREATININE 3.57* 2.92* 2.28* 1.33* 1.08  CALCIUM 7.4* 7.4* 7.8* 8.0* 8.2*  MG 2.5* 3.1* 3.3* 3.1* 2.4  PHOS  --  8.7*  --  4.1  --    GFR: CrCl cannot be calculated (Unknown ideal weight.). Liver Function Tests: Recent Labs  Lab 08/24/20 0420 08/25/20 0349 08/26/20 0351 08/27/20 0454 08/28/20 0431  AST 172* 193* 187* 252* 191*  ALT 51* 57* 63* 102* 123*  ALKPHOS 261* 308* 301* 297* 274*  BILITOT 10.2* 10.7* 8.6* 8.1* 6.5*  PROT 6.4* 7.0 7.6 7.5 6.7  ALBUMIN 2.5* 2.9* 3.5 3.5 3.2*   Recent Labs  Lab 08/23/20 1500  LIPASE 99*   Recent Labs  Lab 08/22/20 1817 08/25/20 0349 08/27/20 0454  AMMONIA 44* 68* 61*   Coagulation Profile: Recent Labs  Lab 08/22/20 1817 08/25/20 0349 08/26/20 0351 08/27/20 0454  INR 1.4* 1.8* 2.0* 1.9*   Cardiac Enzymes: Recent Labs  Lab 08/23/20 1500  CKTOTAL 47*   BNP (last 3 results) No results for input(s): PROBNP in the last 8760 hours. HbA1C: No results for input(s): HGBA1C in the last 72 hours. CBG: No results for input(s): GLUCAP in the last  168 hours. Lipid Profile: No results for input(s): CHOL, HDL, LDLCALC, TRIG, CHOLHDL, LDLDIRECT in the last 72 hours. Thyroid Function Tests: No results for input(s): TSH, T4TOTAL, FREET4, T3FREE, THYROIDAB in the last 72 hours. Anemia Panel: Recent Labs    08/27/20 0454  08/28/20 0431  FERRITIN 1,257* 1,128*   Urine analysis:    Component Value Date/Time   COLORURINE AMBER (A) 08/23/2020 1250   APPEARANCEUR TURBID (A) 08/23/2020 1250   LABSPEC 1.019 08/23/2020 1250   PHURINE 5.0 08/23/2020 1250   GLUCOSEU NEGATIVE 08/23/2020 1250   HGBUR NEGATIVE 08/23/2020 1250   BILIRUBINUR SMALL (A) 08/23/2020 1250   KETONESUR NEGATIVE 08/23/2020 1250   PROTEINUR 30 (A) 08/23/2020 1250   UROBILINOGEN 0.2 07/18/2011 0413   NITRITE NEGATIVE 08/23/2020 1250   LEUKOCYTESUR NEGATIVE 08/23/2020 1250   Sepsis Labs: @LABRCNTIP (procalcitonin:4,lacticidven:4)  ) Recent Results (from the past 240 hour(s))  SARS Coronavirus 2 by RT PCR (hospital order, performed in [facility] hospital lab) Nasopharyngeal Nasopharyngeal Swab     Status: Abnormal   Collection Time: 08/22/20  7:18 PM   Specimen: Nasopharyngeal Swab  Result Value Ref Range Status   SARS Coronavirus 2 POSITIVE (A) NEGATIVE Final    Comment: RESULT CALLED TO, READ BACK BY AND VERIFIED WITH: ZULETA K. 1.31.22 @ 2100 BY MECIAL J. (NOTE) SARS-CoV-2 target nucleic acids are DETECTED  SARS-CoV-2 RNA is generally detectable in upper respiratory specimens  during the acute phase of infection.  Positive results are indicative  of the presence of the identified virus, but do not rule out bacterial infection or co-infection with other pathogens not detected by the test.  Clinical correlation with patient history and  other diagnostic information is necessary to determine patient infection status.  The expected result is negative.  Fact Sheet for Patients:   02-06-2000   Fact Sheet for Healthcare Providers:    BoilerBrush.com.cy    This test is not yet approved or cleared by the https://pope.com/ FDA and  has been authorized for detection and/or diagnosis of SARS-CoV-2 by FDA under an Emergency Use Authorization (EUA).  This EUA will remain in effect (meaning this  test can be used) for the duration of  the COVID-19 declaration under Section 564(b)(1) of the Act, 21 U.S.C. section 360-bbb-3(b)(1), unless the authorization is terminated or revoked sooner.  Performed at [facility], [address], [place], [place]       Studies: No results found.  Scheduled Meds: . carvedilol  3.125 mg Oral BID WC  . folic acid  1 mg Oral Daily  . lactulose  10 g Oral BID  . LORazepam  0-4 mg Oral Q12H  . methylPREDNISolone (SOLU-MEDROL) injection  60 mg Intravenous Q12H  . multivitamin with minerals  1 tablet Oral Daily  . pantoprazole  40 mg Oral BID  . thiamine  100 mg Oral Daily    Continuous Infusions: . cefTRIAXone (ROCEPHIN)  IV 2 g (08/28/20 0601)     LOS: 6 days     10/26/20, MD [facility]  To reach me or the doctor on call, go to: [email or web address] Password [facility]  08/28/2020, 7:17 PM

## 2020-08-29 ENCOUNTER — Other Ambulatory Visit (HOSPITAL_COMMUNITY): Payer: Self-pay | Admitting: Internal Medicine

## 2020-08-29 LAB — COMPREHENSIVE METABOLIC PANEL
ALT: 115 U/L — ABNORMAL HIGH (ref 0–44)
AST: 128 U/L — ABNORMAL HIGH (ref 15–41)
Albumin: 2.9 g/dL — ABNORMAL LOW (ref 3.5–5.0)
Alkaline Phosphatase: 244 U/L — ABNORMAL HIGH (ref 38–126)
Anion gap: 12 (ref 5–15)
BUN: 45 mg/dL — ABNORMAL HIGH (ref 6–20)
CO2: 22 mmol/L (ref 22–32)
Calcium: 8 mg/dL — ABNORMAL LOW (ref 8.9–10.3)
Chloride: 101 mmol/L (ref 98–111)
Creatinine, Ser: 0.99 mg/dL (ref 0.61–1.24)
GFR, Estimated: >60 mL/min (ref >60)
Glucose, Bld: 174 mg/dL — ABNORMAL HIGH (ref 70–99)
Potassium: 4.3 mmol/L (ref 3.5–5.1)
Sodium: 135 mmol/L (ref 135–145)
Total Bilirubin: 5.3 mg/dL — ABNORMAL HIGH (ref 0.3–1.2)
Total Protein: 6.4 g/dL — ABNORMAL LOW (ref 6.5–8.1)

## 2020-08-29 LAB — CBC WITH DIFFERENTIAL/PLATELET
Abs Immature Granulocytes: 0.5 10*3/uL — ABNORMAL HIGH (ref 0.00–0.07)
Basophils Absolute: 0 10*3/uL (ref 0.0–0.1)
Basophils Relative: 0 %
Eosinophils Absolute: 0 10*3/uL (ref 0.0–0.5)
Eosinophils Relative: 0 %
HCT: 26.3 % — ABNORMAL LOW (ref 39.0–52.0)
Hemoglobin: 9.4 g/dL — ABNORMAL LOW (ref 13.0–17.0)
Immature Granulocytes: 3 %
Lymphocytes Relative: 5 %
Lymphs Abs: 0.8 10*3/uL (ref 0.7–4.0)
MCH: 34.6 pg — ABNORMAL HIGH (ref 26.0–34.0)
MCHC: 35.7 g/dL (ref 30.0–36.0)
MCV: 96.7 fL (ref 80.0–100.0)
Monocytes Absolute: 0.6 10*3/uL (ref 0.1–1.0)
Monocytes Relative: 4 %
Neutro Abs: 15.6 10*3/uL — ABNORMAL HIGH (ref 1.7–7.7)
Neutrophils Relative %: 88 %
Platelets: 112 10*3/uL — ABNORMAL LOW (ref 150–400)
RBC: 2.72 MIL/uL — ABNORMAL LOW (ref 4.22–5.81)
RDW: 15.4 % (ref 11.5–15.5)
WBC: 17.6 10*3/uL — ABNORMAL HIGH (ref 4.0–10.5)
nRBC: 0.3 % — ABNORMAL HIGH (ref 0.0–0.2)

## 2020-08-29 LAB — PROTIME-INR
INR: 1.8 — ABNORMAL HIGH (ref 0.8–1.2)
Prothrombin Time: 19.9 seconds — ABNORMAL HIGH (ref 11.4–15.2)

## 2020-08-29 MED ORDER — FOLIC ACID 1 MG PO TABS: 1.0000 mg | ORAL_TABLET | Freq: Every day | ORAL | 0 refills | Status: DC

## 2020-08-29 MED ORDER — LISINOPRIL-HYDROCHLOROTHIAZIDE 20-25 MG PO TABS: 1.0000 | ORAL_TABLET | Freq: Every day | ORAL | Status: DC

## 2020-08-29 MED ORDER — PREDNISOLONE 5 MG PO TABS: ORAL_TABLET | ORAL | 0 refills | Status: DC

## 2020-08-29 MED ORDER — LACTULOSE 10 GM/15ML PO SOLN: 10.0000 g | Freq: Two times a day (BID) | ORAL | 0 refills | Status: DC

## 2020-08-29 MED ORDER — THIAMINE HCL 100 MG PO TABS: 100.0000 mg | ORAL_TABLET | Freq: Every day | ORAL | 0 refills | Status: DC

## 2020-08-29 MED ORDER — GUAIFENESIN-DM 100-10 MG/5ML PO SYRP: 5.0000 mL | ORAL_SOLUTION | ORAL | 0 refills | Status: DC | PRN

## 2020-08-29 MED ORDER — LIDOCAINE 5 % EX PTCH: 1.0000 | MEDICATED_PATCH | Freq: Two times a day (BID) | CUTANEOUS | 2 refills | Status: AC

## 2020-08-29 MED ORDER — TRAMADOL HCL 50 MG PO TABS: 50.0000 mg | ORAL_TABLET | Freq: Four times a day (QID) | ORAL | Status: DC | PRN

## 2020-08-29 MED ORDER — PANTOPRAZOLE SODIUM 40 MG PO TBEC: DELAYED_RELEASE_TABLET | ORAL | 0 refills | Status: DC

## 2020-08-29 MED ORDER — OXYCODONE HCL 5 MG PO TABS: 5.0000 mg | ORAL_TABLET | Freq: Three times a day (TID) | ORAL | 0 refills | Status: DC | PRN

## 2020-08-29 MED FILL — PREDNISOLONE 15 MG/5 ML SOL: 15 | 28 days supply | Qty: 373 | Fill #0

## 2020-08-29 NOTE — Discharge Summary (Signed)
Physician Discharge Summary  [name] ZOX:[id number] DOB: Jun 27, 1986 DOA: 08/22/2020  PCP: [name], PA  Admit date: 08/22/2020 Discharge date: 08/29/2020  Admitted From: Home  Discharge disposition: home  Recommendations for Outpatient Follow-Up:   . Follow up with your primary care provider in one week.  . Check CBC, CMP, magnesium, phosphorus in the next visit . Patient has been prescribed prolonged course of prednisolone for his alcoholic hepatitis. Marland Kitchen Please continue counseling regarding abstinence from alcohol . Patient should be discouraged to use NSAIDs due to recent thrombocytopenia bleeding and recent acute kidney injury.  Discharge Diagnosis:   Principal Problem:   ARF (acute renal failure) (HCC) Active Problems:   Alcohol dependence (HCC)   Thrombocytopenia (HCC)   Alcoholic hepatitis   Hypertension   COVID-19 virus infection   Discharge Condition: Improved.  Diet recommendation: Regular diet  Wound care: None.  Code status: Full.   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  Patient had been on Voltaren as well. In the ER , patient was initially noted to be hypotensive and received 2 L of normal saline.  Creatinine was significantly elevated at 3.4 with baseline creatinine of around 0.9.  LFTs were elevated with thrombocytopenia.  Patient also had leukocytosis.  Right upper quadrant ultrasound showed features concerning for  hepatic steatosis. No definite evidence of cirrhosis. He tested positive for Covid infection and well.Patient was then admitted for acute renal failure with thrombocytopenia, epistaxis, acute hepatitis and Covid infection.  Hospital Course:   Following conditions were addressed during  hospitalization as listed below,  Nonoliguric acute kidney injury- Present on admission.  At this time it has resolved.  Thought to be likely multifactorial from non-NSAIDs at home including lisinopril HCTZ, poor oral intake, Covid infection, possible hepatorenal syndrome.    Patient  initially received IV fluids and was given multiple doses of albumin as per nephrology. Renal ultrasound showed no evidence of obstruction. ASO titer elevated but other serology negative.  Severe thrombocytopenia with epistaxis on presentation. Thought to be secondary to alcoholic liver disease, Covid infection. Negative hepatitis and HIV panel.  Status post transfusion of platelets 2 units on 08/23/2020 and bleeding has stopped after that. Platelet trending up.  Platelet prior to discharge was 112 K  Hyponatremia. Likely secondary to liver disease. Has improved at this time after volume replacement.. Sodium prior to discharge was 135  COVID-19 infection with acute hypoxic respiratory failure.  Repeat chest x-ray from 08/25/2020 showed some congestion/infiltrate in the lungs.  Patient received nasal cannula oxygen during hospitalization which has been weaned at this time.  He was given remdesivir and Solu-Medrol due to hypoxia.  Inflammatory markers were trended which have improved.  Alcoholic hepatitis with jaundice-seen by GI for follow-up today. GI has recommended prednisolone 40 mg daily for 4 weeks then taper after completion of IV Solu-Medrol while in the hospital. Rising INR levels. Overall prognosis of the patient is guarded. Not a candidate for liver transplant due to ongoing alcohol usage.  Acute blood loss anemia secondary to epistaxis/hematemesis.      Patient will be discontinued off aspirin.  He was advised to limit NSAIDs.  Patient did not require PRBC transfusion.  His hemoglobin prior to discharge was 9.4.  Leukocytosis likely reactive.  Patient received a 7-day course  of Rocephin for SBP prophylaxis.     He was also on steroids and will be discharged on prednisolone on discharge.  X-ray with infiltrates but unlikely to be bacterial pneumonia.  Leukocytosis trended down.  Need outpatient follow-up.  History of hypertension Lisinopril and hydrochlorothiazide was on hold due to AKI.   Will be resumed on discharge in couple of days.  Alcohol abuse with mild withdrawal.  Patient has significantly cut down on his alcohol and drinks around 1-2 beers a day.    Patient was put on CIWA protocol.  We will continue lactulose.   Left hip pain secondary to avascular necrosis per the patient.  Patient was supposed to have surgical intervention done as outpatient.  Avoid NSAIDs if possible especially in the context of acute kidney injury.  Counseled patient about it  History of sleep apnea. Holds breathe during sleep. He was supposed to have sleep study as outpatient. He does have  typical symptoms of sleep apnea as per the significant other.   History of hypertension, palpitations.  Patient was supposed to have Holter monitor as outpatient prior to orthopedic surgery.  2D echocardiogram performed showed LV ejection fraction of 55 to 60%. EKG with normal sinus rhythm.  Did not have any significant arrhythmias during hospitalization.  Anxiety. Anxiety at baseline.   Erythematous skin rash. Improved, thought to be secondary to viral infection.  Disposition.  At this time, patient is stable for disposition home with outpatient PCP follow-up.  Patient was counseled against abstaining from alcohol.  Spoke with the patient's significant other regarding plan for disposition.  Medical Consultants:    GI  Nephrology  Procedures:    2 units of platelet transfusion on 08/23/2020 Subjective:   Today, patient was seen and examined at bedside.  More alert awake and communicative.  Feels ready to go home.  Denies any nausea vomiting  Discharge Exam:   Vitals:   08/29/20 0504 08/29/20 1200  BP: (!)  138/93 130/88  Pulse: 79 74  Resp: 20   Temp: 98.3 F (36.8 C)   SpO2: 97%    Vitals:   08/28/20 2025 08/29/20 0504 08/29/20 0617 08/29/20 1200  BP: (!) 150/94 (!) 138/93  130/88  Pulse: 79 79  74  Resp: 20 20    Temp: 98.1 F (36.7 C) 98.3 F (36.8 C)    TempSrc: Oral Oral    SpO2: 97% 97%    Weight:  85.9 kg    Height:   5\' 10"  (1.778 m)    General: Alert awake, not in obvious distress HENT: pupils equally reacting to light, mild icterus noted oral mucosa is moist.  Bilateral mild subconjunctival hemorrhage, Chest:  Clear breath sounds.  Diminished breath sounds bilaterally. No crackles or wheezes.  CVS: S1 &S2 heard. No murmur.  Regular rate and rhythm. Abdomen: Soft, nontender, nondistended.  Bowel sounds are heard.   Extremities: No cyanosis, clubbing but trace edema noted.  Peripheral pulses are palpable. Psych: Alert, awake and oriented, normal mood CNS:  No cranial nerve deficits.  Power equal in all extremities.   Skin: Warm and dry.    The results of significant diagnostics from this hospitalization (including imaging, microbiology, ancillary and laboratory) are listed below for reference.     Diagnostic Studies:   DG Chest 2 View  Result Date: 08/22/2020 CLINICAL DATA:  Elevated white blood cell count EXAM: CHEST - 2 VIEW COMPARISON:  None. FINDINGS: Cardiac shadow is mildly enlarged but accentuated by the frontal technique. The  lungs are well aerated bilaterally. No focal infiltrate or effusion is seen. No bony abnormality is noted. IMPRESSION: No acute abnormality noted. Electronically Signed   By: Alcide Clever M.D.   On: 08/22/2020 19:35   US RENAL  Result Date: 08/23/2020 CLINICAL DATA:  Acute renal injury EXAM: RENAL / URINARY TRACT ULTRASOUND COMPLETE COMPARISON:  None. FINDINGS: Right Kidney: Renal measurements: 13.1 x 5.1 x 6.0 cm. = volume: 208 mL. Echogenicity within normal limits. No mass or hydronephrosis visualized. Left Kidney: Renal measurements: 14.4  x 5.7 x 6.3 cm. = volume: 272 mL. Echogenicity within normal limits. No mass or hydronephrosis visualized. Bladder: Appears normal for degree of bladder distention. Other: Increased echogenicity in the liver is noted consistent with fatty infiltration. Spleen is mildly prominent but maintains its spleniform shape. IMPRESSION: No renal abnormality is noted. Changes of fatty liver and mild splenomegaly. Electronically Signed   By: Alcide Clever M.D.   On: 08/23/2020 00:13   US Abdomen Limited  Result Date: 08/22/2020 CLINICAL DATA:  Jaundice.  Concern for liver cirrhosis. EXAM: ULTRASOUND ABDOMEN LIMITED RIGHT UPPER QUADRANT COMPARISON:  None. FINDINGS: Gallbladder: Physiologically distended. No gallstones or wall thickening visualized. No sonographic Murphy sign noted by sonographer. Common bile duct: Diameter: 3 mm, not well-defined. Liver: Diffusely increased parenchymal echogenicity, liver parenchyma is difficult to penetrate. No discrete focal hepatic lesion. No definite capsular nodularity. Portal vein is patent on color Doppler imaging with normal direction of blood flow towards the liver. Other: No right upper quadrant ascites. There are bilateral pleural effusions. Technically challenging exam due to habitus and patient's limited ability to hold breath. IMPRESSION: 1. Diffusely increased hepatic parenchymal echogenicity consistent with steatosis or other intrinsic hepatocellular disease. No capsular nodularity or other findings of cirrhosis. 2. Normal sonographic appearance of the gallbladder and biliary tree. 3. Pleural effusions are noted. There is no right upper quadrant ascites. Electronically Signed   By: Narda Rutherford M.D.   On: 08/22/2020 18:54     Labs:   Basic Metabolic Panel: Recent Labs  Lab 08/24/20 0420 08/25/20 0349 08/26/20 0351 08/27/20 0454 08/28/20 0431 08/29/20 0322  NA 126* 129* 141 138 139 135  K 4.2 4.5 3.9 3.7 3.9 4.3  CL 92* 94* 106 107 104 101  CO2 16* 16* 19*  20* 21* 22  GLUCOSE 102* 165* 190* 156* 160* 174*  BUN 134* 130* 87* 81* 61* 45*  CREATININE 3.57* 2.92* 2.28* 1.33* 1.08 0.99  CALCIUM 7.4* 7.4* 7.8* 8.0* 8.2* 8.0*  MG 2.5* 3.1* 3.3* 3.1* 2.4  --   PHOS  --  8.7*  --  4.1  --   --    GFR Estimated Creatinine Clearance: 106.5 mL/min (by C-G formula based on SCr of 0.99 mg/dL). Liver Function Tests: Recent Labs  Lab 08/25/20 0349 08/26/20 0351 08/27/20 0454 08/28/20 0431 08/29/20 0322  AST 193* 187* 252* 191* 128*  ALT 57* 63* 102* 123* 115*  ALKPHOS 308* 301* 297* 274* 244*  BILITOT 10.7* 8.6* 8.1* 6.5* 5.3*  PROT 7.0 7.6 7.5 6.7 6.4*  ALBUMIN 2.9* 3.5 3.5 3.2* 2.9*   Recent Labs  Lab 08/23/20 1500  LIPASE 99*   Recent Labs  Lab 08/22/20 1817 08/25/20 0349 08/27/20 0454  AMMONIA 44* 68* 61*   Coagulation profile Recent Labs  Lab 08/22/20 1817 08/25/20 0349 08/26/20 0351 08/27/20 0454 08/29/20 0322  INR 1.4* 1.8* 2.0* 1.9* 1.8*    CBC: Recent Labs  Lab 08/24/20 0750 08/25/20 1445 08/26/20 0351 08/27/20 0454 08/28/20  0431 08/29/20 0322  WBC 22.5* 18.6* 20.2* 20.5* 19.4* 17.6*  NEUTROABS 18.4*  --  18.0* 18.2* 17.3* 15.6*  HGB 9.1* 9.8* 9.5* 9.8* 9.4* 9.4*  HCT 24.9* 26.9* 26.0* 27.3* 25.5* 26.3*  MCV 98.0 95.7 95.9 97.5 95.9 96.7  PLT 57* 71* 84* 84* 95* 112*   Cardiac Enzymes: Recent Labs  Lab 08/23/20 1500  CKTOTAL 47*   BNP: Invalid input(s): POCBNP CBG: No results for input(s): GLUCAP in the last 168 hours. D-Dimer Recent Labs    08/27/20 0454  DDIMER 2.28*   Hgb A1c No results for input(s): HGBA1C in the last 72 hours. Lipid Profile No results for input(s): CHOL, HDL, LDLCALC, TRIG, CHOLHDL, LDLDIRECT in the last 72 hours. Thyroid function studies No results for input(s): TSH, T4TOTAL, T3FREE, THYROIDAB in the last 72 hours.  Invalid input(s): FREET3 Anemia work up Recent Labs    08/27/20 0454 08/28/20 0431  FERRITIN 1,257* 1,128*   Microbiology Recent Results (from  the past 240 hour(s))  SARS Coronavirus 2 by RT PCR (hospital order, performed in [facility] hospital lab) Nasopharyngeal Nasopharyngeal Swab     Status: Abnormal   Collection Time: 08/22/20  7:18 PM   Specimen: Nasopharyngeal Swab  Result Value Ref Range Status   SARS Coronavirus 2 POSITIVE (A) NEGATIVE Final    Comment: RESULT CALLED TO, READ BACK BY AND VERIFIED WITH: ZULETA K. 1.31.22 @ 2100 BY MECIAL J. (NOTE) SARS-CoV-2 target nucleic acids are DETECTED  SARS-CoV-2 RNA is generally detectable in upper respiratory specimens  during the acute phase of infection.  Positive results are indicative  of the presence of the identified virus, but do not rule out bacterial infection or co-infection with other pathogens not detected by the test.  Clinical correlation with patient history and  other diagnostic information is necessary to determine patient infection status.  The expected result is negative.  Fact Sheet for Patients:   BoilerBrush.com.cyhttps://www.fda.gov/media/136312/download   Fact Sheet for Healthcare Providers:   https://pope.com/https://www.fda.gov/media/136313/download    This test is not yet approved or cleared by the [place] FDA and  has been authorized for detection and/or diagnosis of SARS-CoV-2 by FDA under an Emergency Use Authorization (EUA).  This EUA will remain in effect (meaning this  test can be used) for the duration of  the COVID-19 declaration under Section 564(b)(1) of the Act, 21 U.S.C. section 360-bbb-3(b)(1), unless the authorization is terminated or revoked sooner.  Performed at [facility], [address] [place], [place] [address]      Discharge Instructions:   Discharge Instructions    Diet - low sodium heart healthy   Complete by: As directed    Discharge instructions   Complete by: As directed    Follow up with your primary  care physician in one week. Check blood work at that time. Follow up with your orthopedic physician for hip  surgery. No alcohol please. Limit use of diclofeance, ibuprofen and tylenol. Do not take aspirin. Use prednisolone as prescribed.   Increase activity slowly   Complete by: As directed      Allergies as of 08/29/2020      Reactions   Cashew Nut (anacardium Occidentale) Skin Test Nausea And Vomiting   Allergy to cashews only; Tolerates other nuts   Cashew Nut Oil Nausea And Vomiting   Allergy to cashews only; Tolerates other nuts      Medication List    STOP taking these medications   amoxicillin-clavulanate 875-125 MG tablet Commonly known  as: AUGMENTIN   aspirin EC 81 MG tablet   diclofenac 75 MG EC tablet Commonly known as: VOLTAREN   hydrochlorothiazide 12.5 MG tablet Commonly known as: HYDRODIURIL   ibuprofen 200 MG tablet Commonly known as: ADVIL   predniSONE 10 MG tablet Commonly known as: DELTASONE   rosuvastatin 5 MG tablet Commonly known as: CRESTOR   traZODone 50 MG tablet Commonly known as: DESYREL     TAKE these medications   buPROPion 300 MG 24 hr tablet Commonly known as: WELLBUTRIN XL Take 1 tablet (300 mg total) by mouth daily.   carbamazepine 200 MG 12 hr tablet Commonly known as: TEGRETOL XR Take 1 tablet (200 mg total) by mouth at bedtime.   carvedilol 3.125 MG tablet Commonly known as: COREG Take 3.125 mg by mouth 2 (two) times daily.   folic acid 1 MG tablet Commonly known as: FOLVITE Take 1 tablet (1 mg total) by mouth daily. Start taking on: August 30, 2020   guaiFENesin-dextromethorphan 100-10 MG/5ML syrup Commonly known as: ROBITUSSIN DM Take 5 mLs by mouth every 4 (four) hours as needed for cough (chest congestion).   hydrOXYzine 25 MG tablet Commonly known as: ATARAX/VISTARIL Take 1 tablet (25 mg total) by mouth every 6 (six) hours as needed for anxiety (or CIWA score </= 10).   lactulose 10 GM/15ML solution Commonly known as: CHRONULAC Take 15 mLs (10 g total) by mouth 2 (two) times daily.   lidocaine 5 % Commonly known  as: Lidoderm Place 1 patch onto the skin every 12 (twelve) hours for 10 days. Remove & Discard patch within 12 hours. Apply on the hip area   lisinopril-hydrochlorothiazide 20-25 MG tablet Commonly known as: ZESTORETIC Take 1 tablet by mouth daily. Start taking on: August 31, 2020 What changed: These instructions start on August 31, 2020. If you are unsure what to do until then, ask your doctor or other care provider.   magnesium oxide 400 MG tablet Commonly known as: MAG-OX Take 1 tablet by mouth daily.   methocarbamol 750 MG tablet Commonly known as: ROBAXIN Take 750 mg by mouth 2 (two) times daily.   multivitamin with minerals Tabs tablet Take 1 tablet by mouth daily.   oxyCODONE 5 MG immediate release tablet Commonly known as: Roxicodone Take 1 tablet (5 mg total) by mouth every 8 (eight) hours as needed.   pantoprazole 40 MG tablet Commonly known as: PROTONIX Take 1 tablet (40 mg total) by mouth 2 (two) times daily before a meal for 28 days, THEN 1 tablet (40 mg total) daily for 28 days. Start taking on: August 29, 2020   prednisoLONE 5 MG Tabs tablet Take 8 tablets (40 mg total) by mouth daily for 28 days, THEN 6 tablets (30 mg total) daily for 4 days, THEN 4 tablets (20 mg total) daily for 4 days, THEN 2 tablets (10 mg total) daily for 4 days, THEN 1 tablet (5 mg total) every 3 (three) days for 1 day. Start taking on: August 29, 2020   sertraline 25 MG tablet Commonly known as: ZOLOFT Take 25 mg by mouth daily.   thiamine 100 MG tablet Take 1 tablet (100 mg total) by mouth daily. Start taking on: August 30, 2020   traMADol 50 MG tablet Commonly known as: ULTRAM Take 1 tablet (50 mg total) by mouth every 6 (six) hours as needed. What changed:   when to take this  reasons to take this  Durable Medical Equipment  (From admission, onward)         Start     Ordered   08/29/20 1131  For home use only DME 3 n 1  Once        08/29/20 1130    08/29/20 1131  For home use only DME Walker rolling  Once       Question Answer Comment  Walker: With 5 Inch Wheels   Patient needs a walker to treat with the following condition Ambulatory dysfunction      08/29/20 1130          Follow-up Information    [place], [name]. Schedule an appointment as soon as possible for a visit in 1 week(s).   Specialty: Physician Assistant Why: regular checkup, blood work Solicitor information: [address] [place] [address] [phone]                Time coordinating discharge: 39 minutes  Signed:  Sequoia Mincey  [facility] 08/29/2020, 4:09 PM

## 2020-08-29 NOTE — Plan of Care (Signed)
  Problem: Health Behavior/Discharge Planning: Goal: Ability to manage health-related needs will improve Outcome: Progressing   Problem: Clinical Measurements: Goal: Respiratory complications will improve Outcome: Progressing   Problem: Nutrition: Goal: Adequate nutrition will be maintained Outcome: Progressing   Problem: Coping: Goal: Level of anxiety will decrease Outcome: Progressing   Problem: Safety: Goal: Ability to remain free from injury will improve Outcome: Progressing   Problem: Skin Integrity: Goal: Risk for impaired skin integrity will decrease Outcome: Progressing   Problem: Respiratory: Goal: Will maintain a patent airway Outcome: Progressing

## 2020-08-29 NOTE — Progress Notes (Signed)
Pt ready for discharge. Pt verbalizes understanding of all discharge instructions. All questions answered. Awaiting ride home.

## 2020-08-29 NOTE — Progress Notes (Signed)
To car via w/c with all belongings, and discharge instructions. Pt is having front wheel walker and BSC delivered to home.

## 2020-08-29 NOTE — TOC Transition Note (Addendum)
Transition of Care [facility]) - CM/SW Discharge Note   Patient Details  Name: [name] MRN: [id number] Date of Birth: [birth year]  [facility] CM/SW Contact:  Ida Rogue, LCSW Phone Number: 08/29/2020, 10:17 AM   Clinical Narrative:  Patient seen in follow up to MD consult for Substance abuse, and PT recommendation for [facility] PT and DME.  [name] states "After this, I'll call it quits on all usage."  He states he has used AA meetings in the past, but has not necessarily found them useful.  He is not interested in either in patient nor outpatient services, and cites supports of girlfriend and mother as resources that will help him maintain his sobriety.  As for [facility] PT, he declined that service, but did ask for equipment of walker and bedside commode, as well as hand held urinal.  I directed him to a pharmacy for urinal, and will get other DME through [facility]. No further needs identified. TOC sign off.  Addendum:  [name] originally stated he wanted DME delivered to room to take with him, but Ian Malkin confirmed that he changed his mind and requested that [facility] ship DME to the home.    Final next level of care: Home/Self Care Barriers to Discharge: No Barriers Identified   Patient Goals and CMS Choice        Discharge Placement                       Discharge Plan and Services                                     Social Determinants of Health (SDOH) Interventions     Readmission Risk Interventions No flowsheet data found.

## 2020-09-13 ENCOUNTER — Emergency Department (HOSPITAL_COMMUNITY): Payer: 59

## 2020-09-13 ENCOUNTER — Other Ambulatory Visit: Payer: Self-pay

## 2020-09-13 ENCOUNTER — Inpatient Hospital Stay (HOSPITAL_COMMUNITY)
Admission: EM | Admit: 2020-09-13 | Discharge: 2020-10-10 | DRG: 853 | Disposition: A | Discharge status: Rehabilitation Facility | Payer: 59 | Attending: Family Medicine | Admitting: Family Medicine

## 2020-09-13 ENCOUNTER — Encounter (HOSPITAL_COMMUNITY): Payer: Self-pay | Admitting: *Deleted

## 2020-09-13 DIAGNOSIS — T8452XA Infection and inflammatory reaction due to internal left hip prosthesis, initial encounter: Secondary | ICD-10-CM | POA: Diagnosis not present

## 2020-09-13 DIAGNOSIS — R52 Pain, unspecified: Secondary | ICD-10-CM | POA: Diagnosis not present

## 2020-09-13 DIAGNOSIS — M879 Osteonecrosis, unspecified: Secondary | ICD-10-CM | POA: Diagnosis present

## 2020-09-13 DIAGNOSIS — F1020 Alcohol dependence, uncomplicated: Secondary | ICD-10-CM | POA: Diagnosis present

## 2020-09-13 DIAGNOSIS — L02214 Cutaneous abscess of groin: Secondary | ICD-10-CM | POA: Diagnosis present

## 2020-09-13 DIAGNOSIS — Z79899 Other long term (current) drug therapy: Secondary | ICD-10-CM | POA: Diagnosis not present

## 2020-09-13 DIAGNOSIS — M60009 Infective myositis, unspecified site: Secondary | ICD-10-CM | POA: Diagnosis not present

## 2020-09-13 DIAGNOSIS — D649 Anemia, unspecified: Secondary | ICD-10-CM | POA: Diagnosis not present

## 2020-09-13 DIAGNOSIS — M00052 Staphylococcal arthritis, left hip: Secondary | ICD-10-CM | POA: Diagnosis present

## 2020-09-13 DIAGNOSIS — Z96642 Presence of left artificial hip joint: Secondary | ICD-10-CM | POA: Diagnosis not present

## 2020-09-13 DIAGNOSIS — M869 Osteomyelitis, unspecified: Secondary | ICD-10-CM | POA: Diagnosis present

## 2020-09-13 DIAGNOSIS — D62 Acute posthemorrhagic anemia: Secondary | ICD-10-CM | POA: Diagnosis present

## 2020-09-13 DIAGNOSIS — M726 Necrotizing fasciitis: Secondary | ICD-10-CM | POA: Diagnosis present

## 2020-09-13 DIAGNOSIS — A419 Sepsis, unspecified organism: Secondary | ICD-10-CM | POA: Diagnosis present

## 2020-09-13 DIAGNOSIS — L0231 Cutaneous abscess of buttock: Secondary | ICD-10-CM | POA: Diagnosis present

## 2020-09-13 DIAGNOSIS — E871 Hypo-osmolality and hyponatremia: Secondary | ICD-10-CM | POA: Diagnosis present

## 2020-09-13 DIAGNOSIS — K7210 Chronic hepatic failure without coma: Secondary | ICD-10-CM | POA: Diagnosis present

## 2020-09-13 DIAGNOSIS — T8149XA Infection following a procedure, other surgical site, initial encounter: Secondary | ICD-10-CM | POA: Diagnosis not present

## 2020-09-13 DIAGNOSIS — Z8616 Personal history of COVID-19: Secondary | ICD-10-CM | POA: Diagnosis not present

## 2020-09-13 DIAGNOSIS — N5089 Other specified disorders of the male genital organs: Secondary | ICD-10-CM

## 2020-09-13 DIAGNOSIS — Z01818 Encounter for other preprocedural examination: Secondary | ICD-10-CM | POA: Diagnosis not present

## 2020-09-13 DIAGNOSIS — L02416 Cutaneous abscess of left lower limb: Secondary | ICD-10-CM | POA: Diagnosis present

## 2020-09-13 DIAGNOSIS — Z20822 Contact with and (suspected) exposure to covid-19: Secondary | ICD-10-CM | POA: Diagnosis present

## 2020-09-13 DIAGNOSIS — E441 Mild protein-calorie malnutrition: Secondary | ICD-10-CM | POA: Diagnosis present

## 2020-09-13 DIAGNOSIS — R188 Other ascites: Secondary | ICD-10-CM

## 2020-09-13 DIAGNOSIS — K766 Portal hypertension: Secondary | ICD-10-CM | POA: Diagnosis present

## 2020-09-13 DIAGNOSIS — L03116 Cellulitis of left lower limb: Secondary | ICD-10-CM | POA: Diagnosis present

## 2020-09-13 DIAGNOSIS — I10 Essential (primary) hypertension: Secondary | ICD-10-CM | POA: Diagnosis present

## 2020-09-13 DIAGNOSIS — J189 Pneumonia, unspecified organism: Secondary | ICD-10-CM | POA: Diagnosis not present

## 2020-09-13 DIAGNOSIS — Z91018 Allergy to other foods: Secondary | ICD-10-CM | POA: Diagnosis not present

## 2020-09-13 DIAGNOSIS — R6521 Severe sepsis with septic shock: Secondary | ICD-10-CM | POA: Diagnosis present

## 2020-09-13 DIAGNOSIS — K7010 Alcoholic hepatitis without ascites: Secondary | ICD-10-CM | POA: Diagnosis not present

## 2020-09-13 DIAGNOSIS — F17290 Nicotine dependence, other tobacco product, uncomplicated: Secondary | ICD-10-CM | POA: Diagnosis present

## 2020-09-13 DIAGNOSIS — K219 Gastro-esophageal reflux disease without esophagitis: Secondary | ICD-10-CM | POA: Diagnosis present

## 2020-09-13 DIAGNOSIS — B9561 Methicillin susceptible Staphylococcus aureus infection as the cause of diseases classified elsewhere: Secondary | ICD-10-CM | POA: Diagnosis not present

## 2020-09-13 DIAGNOSIS — K6812 Psoas muscle abscess: Secondary | ICD-10-CM | POA: Diagnosis present

## 2020-09-13 DIAGNOSIS — R5381 Other malaise: Secondary | ICD-10-CM | POA: Diagnosis not present

## 2020-09-13 DIAGNOSIS — M8700 Idiopathic aseptic necrosis of unspecified bone: Secondary | ICD-10-CM | POA: Diagnosis not present

## 2020-09-13 DIAGNOSIS — F411 Generalized anxiety disorder: Secondary | ICD-10-CM | POA: Diagnosis present

## 2020-09-13 DIAGNOSIS — D539 Nutritional anemia, unspecified: Secondary | ICD-10-CM | POA: Diagnosis not present

## 2020-09-13 DIAGNOSIS — E872 Acidosis: Secondary | ICD-10-CM | POA: Diagnosis not present

## 2020-09-13 DIAGNOSIS — K7011 Alcoholic hepatitis with ascites: Secondary | ICD-10-CM | POA: Diagnosis not present

## 2020-09-13 DIAGNOSIS — K7031 Alcoholic cirrhosis of liver with ascites: Secondary | ICD-10-CM | POA: Diagnosis present

## 2020-09-13 DIAGNOSIS — M792 Neuralgia and neuritis, unspecified: Secondary | ICD-10-CM | POA: Diagnosis not present

## 2020-09-13 DIAGNOSIS — M6009 Infective myositis, multiple sites: Secondary | ICD-10-CM | POA: Diagnosis present

## 2020-09-13 DIAGNOSIS — M009 Pyogenic arthritis, unspecified: Secondary | ICD-10-CM | POA: Diagnosis not present

## 2020-09-13 DIAGNOSIS — M25552 Pain in left hip: Secondary | ICD-10-CM | POA: Diagnosis not present

## 2020-09-13 DIAGNOSIS — G8929 Other chronic pain: Secondary | ICD-10-CM | POA: Diagnosis present

## 2020-09-13 DIAGNOSIS — L0889 Other specified local infections of the skin and subcutaneous tissue: Secondary | ICD-10-CM | POA: Diagnosis not present

## 2020-09-13 DIAGNOSIS — K7030 Alcoholic cirrhosis of liver without ascites: Secondary | ICD-10-CM

## 2020-09-13 DIAGNOSIS — R7881 Bacteremia: Secondary | ICD-10-CM

## 2020-09-13 DIAGNOSIS — B999 Unspecified infectious disease: Secondary | ICD-10-CM

## 2020-09-13 DIAGNOSIS — F1029 Alcohol dependence with unspecified alcohol-induced disorder: Secondary | ICD-10-CM | POA: Diagnosis not present

## 2020-09-13 DIAGNOSIS — L0291 Cutaneous abscess, unspecified: Secondary | ICD-10-CM

## 2020-09-13 DIAGNOSIS — K6819 Other retroperitoneal abscess: Secondary | ICD-10-CM | POA: Diagnosis present

## 2020-09-13 DIAGNOSIS — N433 Hydrocele, unspecified: Secondary | ICD-10-CM | POA: Diagnosis not present

## 2020-09-13 DIAGNOSIS — E8770 Fluid overload, unspecified: Secondary | ICD-10-CM | POA: Diagnosis not present

## 2020-09-13 DIAGNOSIS — A4101 Sepsis due to Methicillin susceptible Staphylococcus aureus: Principal | ICD-10-CM | POA: Diagnosis present

## 2020-09-13 DIAGNOSIS — K922 Gastrointestinal hemorrhage, unspecified: Secondary | ICD-10-CM | POA: Diagnosis present

## 2020-09-13 DIAGNOSIS — R21 Rash and other nonspecific skin eruption: Secondary | ICD-10-CM | POA: Diagnosis not present

## 2020-09-13 DIAGNOSIS — M7989 Other specified soft tissue disorders: Secondary | ICD-10-CM | POA: Diagnosis not present

## 2020-09-13 DIAGNOSIS — R509 Fever, unspecified: Secondary | ICD-10-CM | POA: Diagnosis not present

## 2020-09-13 DIAGNOSIS — L0390 Cellulitis, unspecified: Secondary | ICD-10-CM | POA: Diagnosis not present

## 2020-09-13 DIAGNOSIS — Z6824 Body mass index (BMI) 24.0-24.9, adult: Secondary | ICD-10-CM

## 2020-09-13 LAB — COMPREHENSIVE METABOLIC PANEL
ALT: 42 U/L (ref 0–44)
AST: 40 U/L (ref 15–41)
Albumin: 2.2 g/dL — ABNORMAL LOW (ref 3.5–5.0)
Alkaline Phosphatase: 156 U/L — ABNORMAL HIGH (ref 38–126)
Anion gap: 13 (ref 5–15)
BUN: 17 mg/dL (ref 6–20)
CO2: 21 mmol/L — ABNORMAL LOW (ref 22–32)
Calcium: 8.4 mg/dL — ABNORMAL LOW (ref 8.9–10.3)
Chloride: 94 mmol/L — ABNORMAL LOW (ref 98–111)
Creatinine, Ser: 0.59 mg/dL — ABNORMAL LOW (ref 0.61–1.24)
GFR, Estimated: >60 mL/min (ref >60)
Glucose, Bld: 112 mg/dL — ABNORMAL HIGH (ref 70–99)
Potassium: 4.3 mmol/L (ref 3.5–5.1)
Sodium: 128 mmol/L — ABNORMAL LOW (ref 135–145)
Total Bilirubin: 2.3 mg/dL — ABNORMAL HIGH (ref 0.3–1.2)
Total Protein: 6.7 g/dL (ref 6.5–8.1)

## 2020-09-13 LAB — LACTATE DEHYDROGENASE: LDH: 308 U/L — ABNORMAL HIGH (ref 98–192)

## 2020-09-13 LAB — CBC WITH DIFFERENTIAL/PLATELET
Abs Immature Granulocytes: 0.5 10*3/uL — ABNORMAL HIGH (ref 0.00–0.07)
Basophils Absolute: 0 10*3/uL (ref 0.0–0.1)
Basophils Relative: 0 %
Eosinophils Absolute: 0 10*3/uL (ref 0.0–0.5)
Eosinophils Relative: 0 %
HCT: 18.9 % — ABNORMAL LOW (ref 39.0–52.0)
Hemoglobin: 6.2 g/dL — CL (ref 13.0–17.0)
Immature Granulocytes: 5 %
Lymphocytes Relative: 14 %
Lymphs Abs: 1.5 10*3/uL (ref 0.7–4.0)
MCH: 34.8 pg — ABNORMAL HIGH (ref 26.0–34.0)
MCHC: 32.8 g/dL (ref 30.0–36.0)
MCV: 106.2 fL — ABNORMAL HIGH (ref 80.0–100.0)
Monocytes Absolute: 0.2 10*3/uL (ref 0.1–1.0)
Monocytes Relative: 2 %
Neutro Abs: 8.4 10*3/uL — ABNORMAL HIGH (ref 1.7–7.7)
Neutrophils Relative %: 79 %
Platelets: 170 10*3/uL (ref 150–400)
RBC: 1.78 MIL/uL — ABNORMAL LOW (ref 4.22–5.81)
RDW: 14.6 % (ref 11.5–15.5)
WBC: 10.6 10*3/uL — ABNORMAL HIGH (ref 4.0–10.5)
nRBC: 1.3 % — ABNORMAL HIGH (ref 0.0–0.2)

## 2020-09-13 LAB — AMMONIA: Ammonia: 45 umol/L — ABNORMAL HIGH (ref 9–35)

## 2020-09-13 LAB — IRON AND TIBC
Iron: 44 ug/dL — ABNORMAL LOW (ref 45–182)
Saturation Ratios: 25 % (ref 17.9–39.5)
TIBC: 175 ug/dL — ABNORMAL LOW (ref 250–450)
UIBC: 131 ug/dL

## 2020-09-13 LAB — PROTIME-INR
INR: 1.4 — ABNORMAL HIGH (ref 0.8–1.2)
Prothrombin Time: 16.5 seconds — ABNORMAL HIGH (ref 11.4–15.2)

## 2020-09-13 LAB — LACTIC ACID, PLASMA
Lactic Acid, Venous: 3.3 mmol/L (ref 0.5–1.9)
Lactic Acid, Venous: 2.5 mmol/L (ref 0.5–1.9)

## 2020-09-13 LAB — PREPARE RBC (CROSSMATCH)

## 2020-09-13 LAB — RESP PANEL BY RT-PCR (FLU A&B, COVID) ARPGX2
Influenza A by PCR: NEGATIVE
Influenza B by PCR: NEGATIVE
SARS Coronavirus 2 by RT PCR: NEGATIVE

## 2020-09-13 LAB — HEMOGLOBIN AND HEMATOCRIT, BLOOD
HCT: 19 % — ABNORMAL LOW (ref 39.0–52.0)
Hemoglobin: 6.3 g/dL — CL (ref 13.0–17.0)

## 2020-09-13 LAB — POC OCCULT BLOOD, ED: Fecal Occult Bld: NEGATIVE

## 2020-09-13 MED ORDER — FOLIC ACID 1 MG PO TABS
1.0000 mg | ORAL_TABLET | Freq: Every day | ORAL | Status: DC
  Administered 2020-09-13 – 2020-10-10 (×27): 1 mg via ORAL
  Filled 2020-09-13 (×27): qty 1

## 2020-09-13 MED ORDER — LACTULOSE 10 GM/15ML PO SOLN
10.0000 g | Freq: Two times a day (BID) | ORAL | Status: DC
  Administered 2020-09-13 – 2020-10-10 (×50): 10 g via ORAL
  Filled 2020-09-13 (×51): qty 15

## 2020-09-13 MED ORDER — OXYCODONE HCL 5 MG PO TABS
5.0000 mg | ORAL_TABLET | ORAL | Status: DC | PRN
  Administered 2020-09-13 – 2020-09-18 (×16): 5 mg via ORAL
  Filled 2020-09-13 (×17): qty 1

## 2020-09-13 MED ORDER — DIPHENHYDRAMINE HCL 12.5 MG/5ML PO ELIX: 12.5000 mg | ORAL_SOLUTION | Freq: Once | ORAL | Status: DC

## 2020-09-13 MED ORDER — CEFAZOLIN SODIUM-DEXTROSE 1-4 GM/50ML-% IV SOLN
1.0000 g | Freq: Once | INTRAVENOUS | Status: AC
  Administered 2020-09-13: 1 g via INTRAVENOUS
  Filled 2020-09-13 (×2): qty 50

## 2020-09-13 MED ORDER — SODIUM CHLORIDE 0.9% IV SOLUTION: Freq: Once | INTRAVENOUS | Status: AC

## 2020-09-13 MED ORDER — MAGNESIUM OXIDE 400 MG PO TABS
400.0000 mg | ORAL_TABLET | Freq: Every day | ORAL | Status: DC
  Filled 2020-09-13: qty 1

## 2020-09-13 MED ORDER — ACETAMINOPHEN 650 MG RE SUPP: 650.0000 mg | Freq: Four times a day (QID) | RECTAL | Status: DC | PRN

## 2020-09-13 MED ORDER — CARVEDILOL 3.125 MG PO TABS
3.1250 mg | ORAL_TABLET | Freq: Two times a day (BID) | ORAL | Status: DC
  Administered 2020-09-13 – 2020-09-15 (×4): 3.125 mg via ORAL
  Filled 2020-09-13 (×4): qty 1

## 2020-09-13 MED ORDER — THIAMINE HCL 100 MG PO TABS
100.0000 mg | ORAL_TABLET | Freq: Every day | ORAL | Status: DC
  Administered 2020-09-13 – 2020-10-10 (×27): 100 mg via ORAL
  Filled 2020-09-13 (×27): qty 1

## 2020-09-13 MED ORDER — SERTRALINE HCL 25 MG PO TABS
25.0000 mg | ORAL_TABLET | Freq: Every day | ORAL | Status: DC
  Administered 2020-09-14 – 2020-09-30 (×17): 25 mg via ORAL
  Filled 2020-09-13 (×17): qty 1

## 2020-09-13 MED ORDER — PREDNISOLONE SODIUM PHOSPHATE 15 MG/5ML PO SOLN: 20.0000 mg | Freq: Every day | ORAL | Status: DC

## 2020-09-13 MED ORDER — IOHEXOL 300 MG/ML  SOLN
100.0000 mL | Freq: Once | INTRAMUSCULAR | Status: AC | PRN
  Administered 2020-09-13: 100 mL via INTRAVENOUS

## 2020-09-13 MED ORDER — PREDNISOLONE SODIUM PHOSPHATE 15 MG/5ML PO SOLN
40.0000 mg | ORAL | Status: DC
Start: 2020-09-13 — End: 2020-09-13

## 2020-09-13 MED ORDER — PREDNISOLONE SODIUM PHOSPHATE 15 MG/5ML PO SOLN: 30.0000 mg | Freq: Every day | ORAL | Status: DC

## 2020-09-13 MED ORDER — DOCUSATE SODIUM 100 MG PO CAPS
100.0000 mg | ORAL_CAPSULE | Freq: Two times a day (BID) | ORAL | Status: DC
  Administered 2020-09-13 – 2020-10-10 (×47): 100 mg via ORAL
  Filled 2020-09-13 (×50): qty 1

## 2020-09-13 MED ORDER — VANCOMYCIN HCL 1500 MG/300ML IV SOLN
1500.0000 mg | Freq: Two times a day (BID) | INTRAVENOUS | Status: DC
  Administered 2020-09-13: 1500 mg via INTRAVENOUS
  Filled 2020-09-13: qty 300

## 2020-09-13 MED ORDER — ACETAMINOPHEN 325 MG PO TABS
650.0000 mg | ORAL_TABLET | Freq: Four times a day (QID) | ORAL | Status: DC | PRN
  Administered 2020-09-13 – 2020-10-08 (×17): 650 mg via ORAL
  Filled 2020-09-13 (×17): qty 2

## 2020-09-13 MED ORDER — DIPHENHYDRAMINE HCL 12.5 MG/5ML PO ELIX
25.0000 mg | ORAL_SOLUTION | Freq: Once | ORAL | Status: AC
  Administered 2020-09-13: 25 mg via ORAL
  Filled 2020-09-13: qty 10

## 2020-09-13 MED ORDER — PREDNISOLONE SODIUM PHOSPHATE 15 MG/5ML PO SOLN: 10.0000 mg | Freq: Every day | ORAL | Status: DC

## 2020-09-13 MED ORDER — PANTOPRAZOLE SODIUM 40 MG PO TBEC
40.0000 mg | DELAYED_RELEASE_TABLET | Freq: Two times a day (BID) | ORAL | Status: DC
  Administered 2020-09-13 – 2020-10-10 (×52): 40 mg via ORAL
  Filled 2020-09-13 (×52): qty 1

## 2020-09-13 MED ORDER — PREDNISOLONE SODIUM PHOSPHATE 15 MG/5ML PO SOLN
40.0000 mg | Freq: Every day | ORAL | Status: DC
  Administered 2020-09-13: 40 mg via ORAL
  Filled 2020-09-13: qty 15

## 2020-09-13 MED ORDER — PREDNISOLONE SODIUM PHOSPHATE 15 MG/5ML PO SOLN: 5.0000 mg | Freq: Every day | ORAL | Status: DC

## 2020-09-13 MED ORDER — ONDANSETRON HCL 4 MG PO TABS: 4.0000 mg | ORAL_TABLET | Freq: Four times a day (QID) | ORAL | Status: DC | PRN

## 2020-09-13 MED ORDER — SODIUM CHLORIDE 0.9 % IV SOLN: Freq: Once | INTRAVENOUS | Status: AC

## 2020-09-13 MED ORDER — ONDANSETRON HCL 4 MG/2ML IJ SOLN
4.0000 mg | Freq: Four times a day (QID) | INTRAMUSCULAR | Status: DC | PRN
  Administered 2020-09-27: 4 mg via INTRAVENOUS

## 2020-09-13 MED ORDER — SODIUM CHLORIDE 0.9 % IV SOLN
2.0000 g | Freq: Three times a day (TID) | INTRAVENOUS | Status: DC
  Administered 2020-09-13 (×2): 2 g via INTRAVENOUS
  Filled 2020-09-13 (×3): qty 2

## 2020-09-13 MED ORDER — VANCOMYCIN HCL 1750 MG/350ML IV SOLN
1750.0000 mg | Freq: Once | INTRAVENOUS | Status: AC
  Administered 2020-09-13: 1750 mg via INTRAVENOUS
  Filled 2020-09-13: qty 350

## 2020-09-13 NOTE — ED Notes (Signed)
Blood bank has blood ready for this patient. Informed Ellen,RN

## 2020-09-13 NOTE — Progress Notes (Signed)
A consult was received from an ED physician for vancomycin per pharmacy dosing.  The patient's profile has been reviewed for ht/wt/allergies/indication/available labs.   A one time order has been placed for vancomycin 1750 mg IV.  Further antibiotics/pharmacy consults should be ordered by admitting physician if indicated.                       Thank you, Royce Macadamia, PharmD, BCPS 09/13/2020  11:07 AM

## 2020-09-13 NOTE — Progress Notes (Signed)
Pharmacy Antibiotic Note  [name] is a 35 y.o. male admitted on 09/13/2020 with thigh pain; found to have Left iliopsoas and gluteal abscesses. Known PMH avascular necrosis of L hip; scheduled for arthroplasty in March. Pharmacy has been consulted for vancomycin and cefepime dosing.  Plan:  Vancomycin 1750 mg IV now, then 1500 mg IV q12 hr (est AUC 462 based on SCr rounded to 0.8; Vd 0.72)  Measure vancomycin AUC at steady state as indicated  SCr q48 while on vanc  Cefepime 2 g IV q8 hr   Height: 5\' 10"  (177.8 cm) Weight: 78.9 kg (174 lb) IBW/kg (Calculated) : 73  Temp (24hrs), Avg:99.2 F (37.3 C), Min:98.5 F (36.9 C), Max:99.9 F (37.7 C)  Recent Labs  Lab 09/13/20 1030 09/13/20 1045 09/13/20 1340  WBC  --  10.6*  --   CREATININE  --  0.59*  --   LATICACIDVEN 3.3*  --  2.5*    Estimated Creatinine Clearance: 131.8 mL/min (A) (by C-G formula based on SCr of 0.59 mg/dL (L)).    Allergies  Allergen Reactions  . Cashew Nut (Anacardium Occidentale) Skin Test Nausea And Vomiting    Allergy to cashews only; Tolerates other nuts  . Cashew Nut Oil Nausea And Vomiting    Allergy to cashews only; Tolerates other nuts    Antimicrobials this admission: 2/22 Ancef x 1 2/22 vancomycin >>  2/22 cefepime >>   Dose adjustments this admission: n/a  Microbiology results: 2/22 BCx: sent 2/22 L hip synovial fluid: ordered  Thank you for allowing pharmacy to be a part of this patient's care.  Keslyn Teater A 09/13/2020 4:36 PM

## 2020-09-13 NOTE — Consult Note (Addendum)
[facility] Surgery Consult Note  [name] 1986-03-23  [id number].    Requesting MD: Jacalyn Lefevre Chief Complaint/Reason for Consult: left thigh abscesses  HPI:  COSTAS SENA is a 35yo male PMH HTN, alcohol abuse and alcoholic cirrhosis (has not had any alcohol x30 days) who presented to [facility] for evaluation of anemia and left leg pain/swelling. He reports a known h/o left hip avascular necrosis. He is followed by Dr. Joycelyn Man outpatient and is scheduled for a hip replacement on 09/26/20. States that he has had progressive pain in this area x1 month, acutely worse x2 days. Over the last two days the area has become more swollen, tender, and red. Denies fever, chills, or drainage from this area. Denies lower extremity n/t. He is able to ambulate but now requires a cane.  He was seen by his PCP today for post-hospitalization follow up after recent admission 08/22/20 through 08/29/20 for acute kidney injury, thrombocytopenia, and covid-19 infection. His hemoglobin was found to be 6.6 and he was advised to go to the ED.  In the ED he was found to be tachycardic, BP ok, temp 99.9. WBC 10.6, lactic acid 3.3. CT scan revealed innumerable fluid collections within the musculature of the left ileopsoas muscle, left gluteal region, and proximal left thigh consistent with innumerable abscesses that have formed within the musculature; significant destruction of the left femoral head which may be related to chronic septic arthritis or inflammation with left hip effusion; mild subcutaneous edema or inflammation is seen involving the visualized portions of the left upper thigh and hip region. General surgery asked to see.  Anticoagulants: none Nonsmoker Previous h/o heavy alcohol use, has not had any alcohol in 30 days Employment: Curator  Review of Systems  Constitutional: Positive for malaise/fatigue.  Respiratory: Negative.   Cardiovascular: Negative.   Gastrointestinal: Negative for  abdominal pain, blood in stool and melena.  Musculoskeletal:       Left leg pain   All systems reviewed and otherwise negative except for as above  Family History  Problem Relation Age of Onset  . Cirrhosis Neg Hx   . Diabetes Mellitus II Neg Hx     Past Medical History:  Diagnosis Date  . Alcohol abuse   . Asthma   . Depression   . Hypertension     Past Surgical History:  Procedure Laterality Date  . FRACTURE SURGERY      Social History:  reports that he has been smoking cigars. He has never used smokeless tobacco. He reports current alcohol use of about 12.0 - 18.0 standard drinks of alcohol per week. He reports current drug use. Drug: Marijuana.  Allergies:  Allergies  Allergen Reactions  . Cashew Nut (Anacardium Occidentale) Skin Test Nausea And Vomiting    Allergy to cashews only; Tolerates other nuts  . Cashew Nut Oil Nausea And Vomiting    Allergy to cashews only; Tolerates other nuts    (Not in a hospital admission)   Prior to Admission medications   Medication Sig Start Date End Date Taking? Authorizing Provider  carvedilol (COREG) 3.125 MG tablet Take 3.125 mg by mouth 2 (two) times daily. 07/28/20  Yes [provider]  folic acid (FOLVITE) 1 MG tablet Take 1 tablet (1 mg total) by mouth daily. 08/30/20  Yes Pokhrel, Laxman, MD  guaiFENesin-dextromethorphan (ROBITUSSIN DM) 100-10 MG/5ML syrup Take 5 mLs by mouth every 4 (four) hours as needed for cough (chest congestion). Patient taking differently: Take 10 mLs by mouth every  4 (four) hours as needed for cough (chest congestion). 08/29/20  Yes Pokhrel, Laxman, MD  lactulose (CHRONULAC) 10 GM/15ML solution Take 15 mLs (10 g total) by mouth 2 (two) times daily. 08/29/20  Yes Pokhrel, Laxman, MD  magnesium oxide (MAG-OX) 400 MG tablet Take 1 tablet by mouth daily. 07/28/20  Yes [provider]  pantoprazole (PROTONIX) 40 MG tablet Take 1 tablet (40 mg total) by mouth 2 (two) times daily before a meal for  28 days, THEN 1 tablet (40 mg total) daily for 28 days. 08/29/20 10/24/20 Yes Pokhrel, Laxman, MD  prednisoLONE (ORAPRED) 15 MG/5ML solution Take 1.6-13.3 mLs by mouth See admin instructions. Take 13.58ml by mouth daily for 28 days, 64ml for 4 days, 6.27ml daily for 4 days, 3.3 daily for 4 days, then 1.25ml daily for 3 days. 08/29/20  Yes [provider]  sertraline (ZOLOFT) 25 MG tablet Take 25 mg by mouth at bedtime. 08/20/20  Yes [provider]  thiamine 100 MG tablet Take 1 tablet (100 mg total) by mouth daily. 08/30/20  Yes Pokhrel, Laxman, MD  buPROPion (WELLBUTRIN XL) 300 MG 24 hr tablet Take 1 tablet (300 mg total) by mouth daily. Patient not taking: No sig reported 10/16/13   Rachael Fee, MD  carbamazepine (TEGRETOL XR) 200 MG 12 hr tablet Take 1 tablet (200 mg total) by mouth at bedtime. Patient not taking: No sig reported 10/16/13   Rachael Fee, MD  hydrOXYzine (ATARAX/VISTARIL) 25 MG tablet Take 1 tablet (25 mg total) by mouth every 6 (six) hours as needed for anxiety (or CIWA score </= 10). Patient not taking: No sig reported 10/16/13   Rachael Fee, MD  lisinopril-hydrochlorothiazide (ZESTORETIC) 20-25 MG tablet Take 1 tablet by mouth daily. Patient not taking: No sig reported 08/31/20   Pokhrel, Rebekah Chesterfield, MD  oxyCODONE (ROXICODONE) 5 MG immediate release tablet Take 1 tablet (5 mg total) by mouth every 8 (eight) hours as needed. Patient not taking: No sig reported 08/29/20 08/29/21  Pokhrel, Rebekah Chesterfield, MD  prednisoLONE 5 MG TABS tablet Take 8 tablets (40 mg total) by mouth daily for 28 days, THEN 6 tablets (30 mg total) daily for 4 days, THEN 4 tablets (20 mg total) daily for 4 days, THEN 2 tablets (10 mg total) daily for 4 days, THEN 1 tablet (5 mg total) every 3 (three) days for 1 day. Patient not taking: Reported on 09/13/2020 08/29/20 10/09/20  Joycelyn Das, MD  traMADol (ULTRAM) 50 MG tablet Take 1 tablet (50 mg total) by mouth every 6 (six) hours as needed. Patient not taking:  Reported on 09/13/2020 08/29/20   Joycelyn Das, MD    Blood pressure 121/65, pulse (!) 124, temperature 99 F (37.2 C), resp. rate (!) 25, height 5\' 10"  (1.778 m), weight 78.9 kg, SpO2 100 %. Physical Exam: General: pleasant, WD/WN male who is laying in bed in NAD HEENT: head is normocephalic, atraumatic.  Sclera are noninjected.  Pupils equal and round.  Ears and nose without any masses or lesions.  Mouth is pink and moist. Dentition fair Heart: tachycardic, regular rhythm.  No obvious murmurs, gallops, or rubs noted.  Palpable pedal pulses bilaterally Lungs: CTAB, no wheezes, rhonchi, or rales noted.  Respiratory effort nonlabored Abd: soft, NT/ND, +BS, no masses, hernias, or organomegaly Skin: warm and dry with no masses, lesions, or rashes Psych: A&Ox4 with an appropriate affect Neuro: cranial nerves grossly intact, moving all 4 extremities, normal speech, thought process intact MS: Right calf soft and nontender  without edema. RLE with significant edema from thigh to foot, compartments soft and compressible, erythema noted in left groin and thigh with blistering and desquamating skin in the groin      Results for orders placed or performed during the hospital encounter of 09/13/20 (from the past 48 hour(s))  Lactic acid, plasma     Status: Abnormal   Collection Time: 09/13/20 10:30 AM  Result Value Ref Range   Lactic Acid, Venous 3.3 (HH) 0.5 - 1.9 mmol/L    Comment: CRITICAL RESULT CALLED TO, READ BACK BY AND VERIFIED WITHKathaleen Maser RN AT 1216 09/13/20 MULLINS,T Performed at [facility], [address], [place], [place] [address]   POC occult blood, ED Provider will collect     Status: None   Collection Time: 09/13/20 10:37 AM  Result Value Ref Range   Fecal Occult Bld NEGATIVE NEGATIVE  Comprehensive metabolic panel     Status: Abnormal   Collection Time: 09/13/20 10:45 AM  Result Value Ref Range   Sodium 128 (L) 135 - 145 mmol/L   Potassium 4.3 3.5 -  5.1 mmol/L   Chloride 94 (L) 98 - 111 mmol/L   CO2 21 (L) 22 - 32 mmol/L   Glucose, Bld 112 (H) 70 - 99 mg/dL    Comment: Glucose reference range applies only to samples taken after fasting for at least 8 hours.   BUN 17 6 - 20 mg/dL   Creatinine, Ser 1.77 (L) 0.61 - 1.24 mg/dL   Calcium 8.4 (L) 8.9 - 10.3 mg/dL   Total Protein 6.7 6.5 - 8.1 g/dL   Albumin 2.2 (L) 3.5 - 5.0 g/dL   AST 40 15 - 41 U/L   ALT 42 0 - 44 U/L   Alkaline Phosphatase 156 (H) 38 - 126 U/L   Total Bilirubin 2.3 (H) 0.3 - 1.2 mg/dL   GFR, Estimated >93 >90 mL/min    Comment: (NOTE) Calculated using the CKD-EPI Creatinine Equation (2021)    Anion gap 13 5 - 15    Comment: Performed at [facility], [address] [place], [place] [address]  CBC WITH DIFFERENTIAL     Status: Abnormal   Collection Time: 09/13/20 10:45 AM  Result Value Ref Range   WBC 10.6 (H) 4.0 - 10.5 K/uL   RBC 1.78 (L) 4.22 - 5.81 MIL/uL   Hemoglobin 6.2 (LL) 13.0 - 17.0 g/dL    Comment: REPEATED TO VERIFY THIS CRITICAL RESULT HAS VERIFIED AND BEEN CALLED TO [facility] BY NICOLE MCCOY ON 02 22 2022 AT 1125, AND HAS BEEN READ BACK. CRITICAL RESULT VERIFIED    HCT 18.9 (L) 39.0 - 52.0 %   MCV 106.2 (H) 80.0 - 100.0 fL   MCH 34.8 (H) 26.0 - 34.0 pg   MCHC 32.8 30.0 - 36.0 g/dL   RDW 33.0 07.6 - 22.6 %   Platelets 170 150 - 400 K/uL   nRBC 1.3 (H) 0.0 - 0.2 %   Neutrophils Relative % 79 %   Neutro Abs 8.4 (H) 1.7 - 7.7 K/uL   Lymphocytes Relative 14 %   Lymphs Abs 1.5 0.7 - 4.0 K/uL   Monocytes Relative 2 %   Monocytes Absolute 0.2 0.1 - 1.0 K/uL   Eosinophils Relative 0 %   Eosinophils Absolute 0.0 0.0 - 0.5 K/uL   Basophils Relative 0 %   Basophils Absolute 0.0 0.0 - 0.1 K/uL   WBC Morphology MILD LEFT SHIFT (1-5% METAS, OCC MYELO, OCC BANDS)  Comment: TOXIC GRANULATION   Immature Granulocytes 5 %   Abs Immature Granulocytes 0.50 (H) 0.00 - 0.07 K/uL   Tear Drop Cells PRESENT    Polychromasia PRESENT      Comment: Performed at [facility], [address] [place], [place] [address]  Protime-INR     Status: Abnormal   Collection Time: 09/13/20 10:45 AM  Result Value Ref Range   Prothrombin Time 16.5 (H) 11.4 - 15.2 seconds   INR 1.4 (H) 0.8 - 1.2    Comment: (NOTE) INR goal varies based on device and disease states. Performed at [facility], [address], [place], [place] [address]   Type and screen [facility]     Status: None (Preliminary result)   Collection Time: 09/13/20 10:45 AM  Result Value Ref Range   ABO/RH(D) O NEG    Antibody Screen NEG    Sample Expiration 09/16/2020,2359    Unit Number [id number]    Blood Component Type RED CELLS,LR    Unit division 00    Status of Unit ALLOCATED    Transfusion Status OK TO TRANSFUSE    Crossmatch Result Compatible    Unit Number [id number]    Blood Component Type RED CELLS,LR    Unit division 00    Status of Unit ISSUED    Transfusion Status OK TO TRANSFUSE    Crossmatch Result      Compatible Performed at [facility], [address] [place], [place] [address]   Prepare RBC (crossmatch)     Status: None   Collection Time: 09/13/20 10:45 AM  Result Value Ref Range   Order Confirmation      ORDER PROCESSED BY BLOOD BANK Performed at [facility], [address] [place], [place] [address]    DG Chest 1 View  Result Date: 09/13/2020 CLINICAL DATA:  Leg swelling EXAM: CHEST  1 VIEW COMPARISON:  08/25/2020. FINDINGS: Mild hypoinflation. No pneumothorax, pleural effusion or focal consolidation. Cardiomediastinal silhouette within normal limits. No acute osseous abnormality. IMPRESSION: No focal airspace disease. Electronically Signed   By: Stana Bunting M.D.   On: 09/13/2020 10:59   CT ABDOMEN PELVIS W CONTRAST  Result Date: 09/13/2020 CLINICAL DATA:  Hepatic cirrhosis. EXAM: CT ABDOMEN AND PELVIS WITH CONTRAST  TECHNIQUE: Multidetector CT imaging of the abdomen and pelvis was performed using the standard protocol following bolus administration of intravenous contrast. CONTRAST:  OMNIPAQUE IOHEXOL 300 MG/ML  SOLN COMPARISON:  None. FINDINGS: Lower chest: No acute abnormality. Hepatobiliary: No gallstones or biliary dilatation is noted. Mildly nodular hepatic contours are noted suggesting hepatic cirrhosis. No definite focal hepatic abnormality is noted. Pancreas: Unremarkable. No pancreatic ductal dilatation or surrounding inflammatory changes. Spleen: Normal in size without focal abnormality. Adrenals/Urinary Tract: Adrenal glands are unremarkable. Kidneys are normal, without renal calculi, focal lesion, or hydronephrosis. Bladder is unremarkable. Stomach/Bowel: Stomach is within normal limits. Appendix appears normal. No evidence of bowel wall thickening, distention, or inflammatory changes. Vascular/Lymphatic: No significant vascular findings are present. No enlarged abdominal or pelvic lymph nodes. Reproductive: Prostate is unremarkable. Other: There is the interval development of fluid collections within the musculature of the left ileo psoas muscle as well as the musculature of the visualized portion of the left gluteal region and proximal left thigh. Diffuse swelling is noted, and these findings are most consistent with innumerable abscesses that have formed within the musculature. Mild subcutaneous edema or inflammation is seen involving the visualized portions of  the left upper thigh and hip region. Musculoskeletal: There is significant destruction of the left femoral head which may be related to chronic septic arthritis or inflammation. Left hip effusion is noted. IMPRESSION: 1. Findings consistent with hepatic cirrhosis. 2. Interval development of innumerable fluid collections are seen within the musculature of the left ileopsoas muscle as well as the musculature of the visualized portion of the left  gluteal region and proximal left thigh consistent with innumerable abscesses that have formed within the musculature, several of which are quite large. Diffuse swelling is noted, and there is noted significant destruction of the left femoral head which may be related to chronic septic arthritis or inflammation. Left hip effusion is noted. 3. Mild subcutaneous edema or inflammation is seen involving the visualized portions of the left upper thigh and hip region. Electronically Signed   By: Lupita RaiderJames  Green Jr M.D.   On: 09/13/2020 12:23    Anti-infectives (From admission, onward)   Start     Dose/Rate Route Frequency Ordered Stop   09/13/20 1130  vancomycin (VANCOREADY) IVPB 1750 mg/350 mL        1,750 mg 175 mL/hr over 120 Minutes Intravenous  Once 09/13/20 1107     09/13/20 1045  ceFAZolin (ANCEF) IVPB 1 g/50 mL premix        1 g 100 mL/hr over 30 Minutes Intravenous  Once 09/13/20 1031 09/13/20 1307       Assessment/Plan HTN Alcohol abuse -  has not had any alcohol x30 days Alcoholic cirrhosis Recent covid-19 infection - tested positive 08/22/20 Anemia  Left hip avascular necrosis - followed by Dr. Joycelyn ManSloan Moore, planning for hip replacement next month  Sepsis Left lower extremity intramuscular abscesses Left hip effusion - CT scan shows innumerable fluid collections within the musculature of the left ileopsoas muscle, left gluteal region, and proximal left thigh consistent with innumerable abscesses that have formed within the musculature; significant destruction of the left femoral head which may be related to chronic septic arthritis or inflammation with left hip effusion; mild subcutaneous edema or inflammation is seen involving the visualized portions of the left upper thigh and hip region - Would not recommend any acute general surgery intervention as abscesses appear intramuscular. Agree with medical admission, broad spectrum antibiotics. With possible left hip joint involvement would  recommend obtaining orthopedics input - I discussed this with EDP. Will place IR consult for evaluation to see if they could possible aspirate any of these abscesses if ortho does not recommend debridement.   ID - ancef, vancomycin VTE - SCDs FEN - IVF, NPO Foley - none Follow up - TBD   Franne FortsBrooke A Keavon Sensing, Cvp [facility] 09/13/2020, 1:33 PM Please see Amion for pager number during day hours 7:00am-4:30pm

## 2020-09-13 NOTE — ED Notes (Signed)
Pt in bed resting, no s/s of distress, respirations even and unlabored

## 2020-09-13 NOTE — ED Triage Notes (Signed)
Pt went to PCP yesterday, labs obtained, Hgb 6.6, legs swollen.

## 2020-09-13 NOTE — Progress Notes (Signed)
Pt admitted from the ER. Oral temperature 101. MD updated via phone. Order to give Tylenol po for temperature and oral Coreg. Recheck Hgb and Hct in one hour

## 2020-09-13 NOTE — H&P (Addendum)
History and Physical    LANG ZINGG YSA:[id number] DOB: [birth year] DOA: 09/13/2020  PCP: [name], PA  Patient coming from: Home  Chief Complaint: fatigue, left leg pain  HPI: [name] is a 35 y.o. male with medical history significant of AVN of hip, HTN, GERD. Presenting with fatigue and left leg pain. Recent admission for COVID (08/22/20). Successfully completed therapy and was discharged on 08/29/20. At that time he was discharged to treatment for alcoholic hepatitis as well. He was ok for a few days after discharge, but he began to feel very tired and weak. He did not try any medicines to help. He tried rest. This progressed over these last 2 weeks. Also during this time, he began having left thigh and leg pain. He reports that the left thigh had become increasingly swollen and tender to touch. He has had a blister rupture on the inner thigh in the groin area. These symptoms increased through 2 days ago when he was no longer ago to walk without great pain. He saw his PCP yesterday who drew labs. He was called this morning by that team and advised to go to the ED immediately. He denies any other aggravating or alleviating factors.   ED Course: He was found to have a Hgb of 6.2. pRBCs were ordered. He was FOBT negative. CT imaging showed left iliopsoas and left gluteal abscesses w/ diffuse swelling and left hip effusion. [facility] was consulted. Patient was started on cefazolin and vanc. TRH was called for admission.   Review of Systems:  Denies CP, palpitations, N/V/D, fever, syncopal episodes. Review of systems is otherwise negative for all not mentioned in HPI.   PMHx Past Medical History:  Diagnosis Date  . Alcohol abuse   . Asthma   . Depression   . Hypertension     PSHx Past Surgical History:  Procedure Laterality Date  . FRACTURE SURGERY      SocHx  reports that he has been smoking cigars. He has never used smokeless tobacco. He reports current alcohol use of  about 12.0 - 18.0 standard drinks of alcohol per week. He reports current drug use. Drug: Marijuana.  Allergies  Allergen Reactions  . Cashew Nut (Anacardium Occidentale) Skin Test Nausea And Vomiting    Allergy to cashews only; Tolerates other nuts  . Cashew Nut Oil Nausea And Vomiting    Allergy to cashews only; Tolerates other nuts    FamHx Family History  Problem Relation Age of Onset  . Cirrhosis Neg Hx   . Diabetes Mellitus II Neg Hx     Prior to Admission medications   Medication Sig Start Date End Date Taking? Authorizing Provider  carvedilol (COREG) 3.125 MG tablet Take 3.125 mg by mouth 2 (two) times daily. 07/28/20  Yes [provider]  folic acid (FOLVITE) 1 MG tablet Take 1 tablet (1 mg total) by mouth daily. 08/30/20  Yes Pokhrel, Laxman, MD  guaiFENesin-dextromethorphan (ROBITUSSIN DM) 100-10 MG/5ML syrup Take 5 mLs by mouth every 4 (four) hours as needed for cough (chest congestion). Patient taking differently: Take 10 mLs by mouth every 4 (four) hours as needed for cough (chest congestion). 08/29/20  Yes Pokhrel, Laxman, MD  lactulose (CHRONULAC) 10 GM/15ML solution Take 15 mLs (10 g total) by mouth 2 (two) times daily. 08/29/20  Yes Pokhrel, Laxman, MD  magnesium oxide (MAG-OX) 400 MG tablet Take 1 tablet by mouth daily. 07/28/20  Yes [provider]  pantoprazole (PROTONIX) 40 MG tablet Take 1 tablet (  40 mg total) by mouth 2 (two) times daily before a meal for 28 days, THEN 1 tablet (40 mg total) daily for 28 days. 08/29/20 10/24/20 Yes Pokhrel, Laxman, MD  prednisoLONE (ORAPRED) 15 MG/5ML solution Take 1.6-13.3 mLs by mouth See admin instructions. Take 13.323ml by mouth daily for 28 days, 10ml for 4 days, 6.726ml daily for 4 days, 3.3 daily for 4 days, then 1.496ml daily for 3 days. 08/29/20  Yes [provider]  sertraline (ZOLOFT) 25 MG tablet Take 25 mg by mouth at bedtime. 08/20/20  Yes [provider]  thiamine 100 MG tablet Take 1 tablet (100 mg  total) by mouth daily. 08/30/20  Yes Pokhrel, Laxman, MD  buPROPion (WELLBUTRIN XL) 300 MG 24 hr tablet Take 1 tablet (300 mg total) by mouth daily. Patient not taking: No sig reported 10/16/13   Rachael FeeLugo, Irving A, MD  carbamazepine (TEGRETOL XR) 200 MG 12 hr tablet Take 1 tablet (200 mg total) by mouth at bedtime. Patient not taking: No sig reported 10/16/13   Rachael FeeLugo, Irving A, MD  hydrOXYzine (ATARAX/VISTARIL) 25 MG tablet Take 1 tablet (25 mg total) by mouth every 6 (six) hours as needed for anxiety (or CIWA score </= 10). Patient not taking: No sig reported 10/16/13   Rachael FeeLugo, Irving A, MD  lisinopril-hydrochlorothiazide (ZESTORETIC) 20-25 MG tablet Take 1 tablet by mouth daily. Patient not taking: No sig reported 08/31/20   Pokhrel, Rebekah ChesterfieldLaxman, MD  oxyCODONE (ROXICODONE) 5 MG immediate release tablet Take 1 tablet (5 mg total) by mouth every 8 (eight) hours as needed. Patient not taking: No sig reported 08/29/20 08/29/21  Pokhrel, Rebekah ChesterfieldLaxman, MD  prednisoLONE 5 MG TABS tablet Take 8 tablets (40 mg total) by mouth daily for 28 days, THEN 6 tablets (30 mg total) daily for 4 days, THEN 4 tablets (20 mg total) daily for 4 days, THEN 2 tablets (10 mg total) daily for 4 days, THEN 1 tablet (5 mg total) every 3 (three) days for 1 day. Patient not taking: Reported on 09/13/2020 08/29/20 10/09/20  Joycelyn DasPokhrel, Laxman, MD  traMADol (ULTRAM) 50 MG tablet Take 1 tablet (50 mg total) by mouth every 6 (six) hours as needed. Patient not taking: Reported on 09/13/2020 08/29/20   Joycelyn DasPokhrel, Laxman, MD    Physical Exam: Vitals:   09/13/20 1255 09/13/20 1305 09/13/20 1322 09/13/20 1323  BP: (!) 124/59 (!) 124/59 118/63 121/65  Pulse: (!) 125 (!) 128 (!) 123 (!) 124  Resp: (!) 29 (!) 36 (!) 27 (!) 25  Temp: 99.9 F (37.7 C)  99 F (37.2 C) 99 F (37.2 C)  TempSrc: Oral  Oral   SpO2:  100% 100% 100%  Weight:      Height:        General: 35 y.o. male resting in bed in NAD Eyes: PERRL, normal sclera ENMT: Nares patent w/o discharge,  orophaynx clear, dentition normal, ears w/o discharge/lesions/ulcers Neck: Supple, trachea midline Cardiovascular: tachy regular, +S1, S2, no m/g/r, equal pulses throughout Respiratory: CTABL, no w/r/r, normal WOB GI: BS+, NDNT, no masses noted, no organomegaly noted MSK: No c/c; trace b/l pedal edema, erythema/edema of left thigh w/ remnant of blister noted in superior medial thigh w/o drainage, TTP left thigh Skin: No rashes, bruises, ulcerations noted Neuro: A&O x 3, no focal deficits Psyc: Appropriate interaction and affect, calm/cooperative  Labs on Admission: I have personally reviewed following labs and imaging studies  CBC: Recent Labs  Lab 09/13/20 1045  WBC 10.6*  NEUTROABS 8.4*  HGB 6.2*  HCT 18.9*  MCV 106.2*  PLT 170   Basic Metabolic Panel: Recent Labs  Lab 09/13/20 1045  NA 128*  K 4.3  CL 94*  CO2 21*  GLUCOSE 112*  BUN 17  CREATININE 0.59*  CALCIUM 8.4*   GFR: Estimated Creatinine Clearance: 131.8 mL/min (A) (by C-G formula based on SCr of 0.59 mg/dL (L)). Liver Function Tests: Recent Labs  Lab 09/13/20 1045  AST 40  ALT 42  ALKPHOS 156*  BILITOT 2.3*  PROT 6.7  ALBUMIN 2.2*   No results for input(s): LIPASE, AMYLASE in the last 168 hours. No results for input(s): AMMONIA in the last 168 hours. Coagulation Profile: Recent Labs  Lab 09/13/20 1045  INR 1.4*   Cardiac Enzymes: No results for input(s): CKTOTAL, CKMB, CKMBINDEX, TROPONINI in the last 168 hours. BNP (last 3 results) No results for input(s): PROBNP in the last 8760 hours. HbA1C: No results for input(s): HGBA1C in the last 72 hours. CBG: No results for input(s): GLUCAP in the last 168 hours. Lipid Profile: No results for input(s): CHOL, HDL, LDLCALC, TRIG, CHOLHDL, LDLDIRECT in the last 72 hours. Thyroid Function Tests: No results for input(s): TSH, T4TOTAL, FREET4, T3FREE, THYROIDAB in the last 72 hours. Anemia Panel: No results for input(s): VITAMINB12, FOLATE,  FERRITIN, TIBC, IRON, RETICCTPCT in the last 72 hours. Urine analysis:    Component Value Date/Time   COLORURINE AMBER (A) 08/23/2020 1250   APPEARANCEUR TURBID (A) 08/23/2020 1250   LABSPEC 1.019 08/23/2020 1250   PHURINE 5.0 08/23/2020 1250   GLUCOSEU NEGATIVE 08/23/2020 1250   HGBUR NEGATIVE 08/23/2020 1250   BILIRUBINUR SMALL (A) 08/23/2020 1250   KETONESUR NEGATIVE 08/23/2020 1250   PROTEINUR 30 (A) 08/23/2020 1250   UROBILINOGEN 0.2 07/18/2011 0413   NITRITE NEGATIVE 08/23/2020 1250   LEUKOCYTESUR NEGATIVE 08/23/2020 1250    Radiological Exams on Admission: DG Chest 1 View  Result Date: 09/13/2020 CLINICAL DATA:  Leg swelling EXAM: CHEST  1 VIEW COMPARISON:  08/25/2020. FINDINGS: Mild hypoinflation. No pneumothorax, pleural effusion or focal consolidation. Cardiomediastinal silhouette within normal limits. No acute osseous abnormality. IMPRESSION: No focal airspace disease. Electronically Signed   By: Stana Bunting M.D.   On: 09/13/2020 10:59   CT ABDOMEN PELVIS W CONTRAST  Result Date: 09/13/2020 CLINICAL DATA:  Hepatic cirrhosis. EXAM: CT ABDOMEN AND PELVIS WITH CONTRAST TECHNIQUE: Multidetector CT imaging of the abdomen and pelvis was performed using the standard protocol following bolus administration of intravenous contrast. CONTRAST:  OMNIPAQUE IOHEXOL 300 MG/ML  SOLN COMPARISON:  None. FINDINGS: Lower chest: No acute abnormality. Hepatobiliary: No gallstones or biliary dilatation is noted. Mildly nodular hepatic contours are noted suggesting hepatic cirrhosis. No definite focal hepatic abnormality is noted. Pancreas: Unremarkable. No pancreatic ductal dilatation or surrounding inflammatory changes. Spleen: Normal in size without focal abnormality. Adrenals/Urinary Tract: Adrenal glands are unremarkable. Kidneys are normal, without renal calculi, focal lesion, or hydronephrosis. Bladder is unremarkable. Stomach/Bowel: Stomach is within normal limits. Appendix appears  normal. No evidence of bowel wall thickening, distention, or inflammatory changes. Vascular/Lymphatic: No significant vascular findings are present. No enlarged abdominal or pelvic lymph nodes. Reproductive: Prostate is unremarkable. Other: There is the interval development of fluid collections within the musculature of the left ileo psoas muscle as well as the musculature of the visualized portion of the left gluteal region and proximal left thigh. Diffuse swelling is noted, and these findings are most consistent with innumerable abscesses that have formed within the musculature. Mild subcutaneous edema or inflammation is seen  involving the visualized portions of the left upper thigh and hip region. Musculoskeletal: There is significant destruction of the left femoral head which may be related to chronic septic arthritis or inflammation. Left hip effusion is noted. IMPRESSION: 1. Findings consistent with hepatic cirrhosis. 2. Interval development of innumerable fluid collections are seen within the musculature of the left ileopsoas muscle as well as the musculature of the visualized portion of the left gluteal region and proximal left thigh consistent with innumerable abscesses that have formed within the musculature, several of which are quite large. Diffuse swelling is noted, and there is noted significant destruction of the left femoral head which may be related to chronic septic arthritis or inflammation. Left hip effusion is noted. 3. Mild subcutaneous edema or inflammation is seen involving the visualized portions of the left upper thigh and hip region. Electronically Signed   By: Lupita Raider M.D.   On: 09/13/2020 12:23   Assessment/Plan Iliopsoas and left gluteal abscesses Sepsis secondary to above Left hip effusion     - admit to inpt, tele     - vanc, cefepime; pharmacy to dose     - fluids     - sepsis: tachycardia, tachypnea, lactic acid 3.3, source: abscesses as above     - general surgery  recs IR involvement for possible abscess drainage and for ortho to review ([facility] consulted IR, EDP has consulted ortho); appreciate assistance, will await further recs  Symptomatic anemia Macrocytic anemia     - getting 2 units pRBCs     - FOBT negative     - check LDH, haptoglobin, iron studies     - continue THF  Hx of alcoholic hepatitis Hepatic cirrhosis     - continue prednisolone (he is on 40mg  through 09/26/20 and then tapers)     - pt and family report he has been alcohol free for 1 month     - continue lactulose, thiamine, magnesium  Hx of tachycardia HTN     - continue coreg  GAD     - continue zoloft   GERD     - continue protonix  Hyponatremia     - fluids, follow  DVT prophylaxis: SCDs  Code Status: FULL  Family Communication: w/ family at bedside  Consults called: EDP spoke with [facility] and Orthopedics (Dr.Landau)  Status is: Inpatient  Remains inpatient appropriate because:Inpatient level of care appropriate due to severity of illness   Dispo: The patient is from: Home              Anticipated d/c is to: Home              Anticipated d/c date is: > 3 days              Patient currently is not medically stable to d/c.   Difficult to place patient No  11/26/20 DO [facility]  If 7PM-7AM, please contact night-coverage [email or web address]  09/13/2020, 1:33 PM

## 2020-09-13 NOTE — Consult Note (Signed)
ORTHOPAEDIC CONSULTATION  REQUESTING PHYSICIAN: Teddy Spike, DO  Chief Complaint: Left hip pain  HPI: [name] is a 35 y.o. male who complains of acute on chronic left hip pain.  He had an x-ray last July 2021 that did not demonstrate any significant bony abnormality.  Subsequent, he has had progressive avascular necrosis with collapse, had an x-ray in January 2022 that demonstrated 25% head collapse.  He was seen by Dr. Joycelyn Man, orthopedic surgeon at [facility] in [place] in [place], telephone [phone] in [place], and in [place] [phone].  He was scheduled for hip replacement surgery in early March, and has had a series of progressive medical complications including thrombocytopenia, liver failure, COVID-19, progressive inability to ambulate, as well as kidney failure.  He has had progressive edema in the left lower extremity.  He denies fevers or chills.  He denies IV drug use.  He has been decreasing his alcohol intake in preparation for his hip replacement surgery.  He has progressed now to the point where he cannot place weight on his left lower extremity, and is unable to function.  He was brought into the emergency room with severe anemia, requiring blood transfusion, jaundice, and left lower extremity swelling.  Past Medical History:  Diagnosis Date   Alcohol abuse    Asthma    Depression    Hypertension    Past Surgical History:  Procedure Laterality Date   FRACTURE SURGERY     Social History   Socioeconomic History   Marital status: Single    Spouse name: Not on file   Number of children: Not on file   Years of education: Not on file   Highest education level: Not on file  Occupational History   Not on file  Tobacco Use   Smoking status: Current Some Day Smoker    Types: Cigars   Smokeless tobacco: Never Used  Substance and Sexual Activity   Alcohol use: Yes    Alcohol/week: 12.0 - 18.0 standard drinks    Types: 12 - 18  Cans of beer per week    Comment: heavy drinker   Drug use: Yes    Types: Marijuana   Sexual activity: Yes    Birth control/protection: None  Other Topics Concern   Not on file  Social History Narrative   Not on file   Social Determinants of Health   Financial Resource Strain: Not on file  Food Insecurity: Not on file  Transportation Needs: Not on file  Physical Activity: Not on file  Stress: Not on file  Social Connections: Not on file   Family History  Problem Relation Age of Onset   Cirrhosis Neg Hx    Diabetes Mellitus II Neg Hx    Allergies  Allergen Reactions   Cashew Nut (Anacardium Occidentale) Skin Test Nausea And Vomiting    Allergy to cashews only; Tolerates other nuts   Cashew Nut Oil Nausea And Vomiting    Allergy to cashews only; Tolerates other nuts     Positive ROS: All other systems have been reviewed and were otherwise negative with the exception of those mentioned in the HPI and as above.  Physical Exam: General: Alert, no acute distress, he does appear jaundiced. Cardiovascular: He has fairly substantial pedal edema in both lower extremities, but particularly the left. Respiratory: No cyanosis, no use of accessory musculature GI: Positive hepatomegaly, with a distended abdomen. Skin: He has a hemorrhagic bulla that is present in the medial groin around  the scrotum and upper inner thigh Neurologic: Sensation intact distally Psychiatric: Patient is competent for consent with normal mood and affect Lymphatic: No axillary or cervical lymphadenopathy from what I could appreciate  MUSCULOSKELETAL: Left lower extremity is shortened and externally rotated, EHL and FHL are intact, he has extreme pain with motion of the left hip.  CAT scan of the abdomen and pelvis demonstrates avascular necrosis with collapse, with multiple fluid collections concerning for abscess formation along his iliopsoas, and left hip  Assessment: Active Problems:   Sepsis  (HCC)  He certainly has a severe presentation of avascular necrosis with collapse combined with multisystem organ failure secondary to alcoholism and cirrhosis.  It would be valuable to confirm whether or not his left hip collapse and the fluid collections around the iliopsoas are in fact septic, or whether these are aggressive avascular tissue fluid collections.    Plan: Recommend interventional radiology to aspirate the iliopsoas fluid collections as well as the left hip, today he may need a Girdlestone intervention with resection of the left hip and cement interposition spacer depending on the presence of infection or not.  First and foremost he needs to get medically optimized from both his anemia standpoint with blood transfusions, management of his liver, and also his kidneys.  We will have to coordinate with his primary orthopedic surgeon once he is medically optimized as to the best course of surgical action the regurgitative.  No surgery planned for today, and unlikely for tomorrow unless we get some more definitive information from interventional radiology.  I will defer to the primary team, but consideration could be made to holding antibiotics if he is clinically stable until after we can get aspiration results from interventional radiology.  Eulas Post, MD Cell [phone]   09/13/2020 4:13 PM

## 2020-09-13 NOTE — ED Notes (Signed)
Pt in bed watching tv, family at bedside. Respirations even and unlabored.

## 2020-09-13 NOTE — ED Provider Notes (Signed)
[facility]-EMERGENCY DEPT Provider Note   CSN: [id number] Arrival date & time: 09/13/20  0854     History Chief Complaint  Patient presents with  . Abnormal Lab    [name] is a 35 y.o. male.  Pt presents to the ED today with a low hemoglobin.  Pt was d/c on 2/7 for AKI.  This was thought to be due from his covid infection, poor oral intake, his bp meds, and possible hepatorenal syndrome.  Pt's Cr went back to 0.99 on d/c from a high of 3.44 on 1/31.  Pt also had severe thrombocytopenia and epistaxis.  Pt was given 2 units of platelets on 2/1 and pt on d/c was 112.  Pt did not require a blood transfusion during hospital stay and hgb at d/c was 9.4.  Pt was diagnosed with Covid while hospitalized.  He was not vaccinated.  He received remdesivir and solumedrol and d/c without oxygen.  Pt is feeling better from a Covid standpoint.  Pt has alcoholic hepatitis and was seen by GI during his stay.  He was not a liver transplant candidate due to continued drinking.  He said he has not drank anything since d/c.  Pt said he feels very tired and has difficulty walking.  Pt denies any more nose bleeds.  No black or bloody stool.  Pt saw his pcp yesterday who did routine labs.  Hgb down to 6.6.  Plt count nl.  Pt also notes some pain to the left hip and groin that started yesterday.          Past Medical History:  Diagnosis Date  . Alcohol abuse   . Asthma   . Depression   . Hypertension     Patient Active Problem List   Diagnosis Date Noted  . Sepsis (HCC) 09/13/2020  . ARF (acute renal failure) (HCC) 08/22/2020  . Thrombocytopenia (HCC) 08/22/2020  . Alcoholic hepatitis 08/22/2020  . Hypertension 08/22/2020  . COVID-19 virus infection 08/22/2020  . Alcohol abuse   . MDD (major depressive disorder) 10/15/2013  . Alcohol dependence (HCC) 04/02/2013  . Social anxiety disorder 04/02/2013  . ALLERGIC RHINITIS 03/28/2007    Past Surgical History:  Procedure  Laterality Date  . FRACTURE SURGERY         Family History  Problem Relation Age of Onset  . Cirrhosis Neg Hx   . Diabetes Mellitus II Neg Hx     Social History   Tobacco Use  . Smoking status: Current Some Day Smoker    Types: Cigars  . Smokeless tobacco: Never Used  Substance Use Topics  . Alcohol use: Yes    Alcohol/week: 12.0 - 18.0 standard drinks    Types: 12 - 18 Cans of beer per week    Comment: heavy drinker  . Drug use: Yes    Types: Marijuana    Home Medications Prior to Admission medications   Medication Sig Start Date End Date Taking? Authorizing Provider  carvedilol (COREG) 3.125 MG tablet Take 3.125 mg by mouth 2 (two) times daily. 07/28/20  Yes [provider]  folic acid (FOLVITE) 1 MG tablet Take 1 tablet (1 mg total) by mouth daily. 08/30/20  Yes Pokhrel, Laxman, MD  guaiFENesin-dextromethorphan (ROBITUSSIN DM) 100-10 MG/5ML syrup Take 5 mLs by mouth every 4 (four) hours as needed for cough (chest congestion). Patient taking differently: Take 10 mLs by mouth every 4 (four) hours as needed for cough (chest congestion). 08/29/20  Yes Pokhrel, Rebekah ChesterfieldLaxman, MD  lactulose (CHRONULAC) 10 GM/15ML solution Take 15 mLs (10 g total) by mouth 2 (two) times daily. 08/29/20  Yes Pokhrel, Laxman, MD  magnesium oxide (MAG-OX) 400 MG tablet Take 1 tablet by mouth daily. 07/28/20  Yes [provider]  pantoprazole (PROTONIX) 40 MG tablet Take 1 tablet (40 mg total) by mouth 2 (two) times daily before a meal for 28 days, THEN 1 tablet (40 mg total) daily for 28 days. 08/29/20 10/24/20 Yes Pokhrel, Laxman, MD  prednisoLONE (ORAPRED) 15 MG/5ML solution Take 1.6-13.3 mLs by mouth See admin instructions. Take 13.73ml by mouth daily for 28 days, 79ml for 4 days, 6.75ml daily for 4 days, 3.3 daily for 4 days, then 1.22ml daily for 3 days. 08/29/20  Yes [provider]  sertraline (ZOLOFT) 25 MG tablet Take 25 mg by mouth at bedtime. 08/20/20  Yes [provider]  thiamine  100 MG tablet Take 1 tablet (100 mg total) by mouth daily. 08/30/20  Yes Pokhrel, Laxman, MD  buPROPion (WELLBUTRIN XL) 300 MG 24 hr tablet Take 1 tablet (300 mg total) by mouth daily. Patient not taking: No sig reported 10/16/13   Rachael Fee, MD  carbamazepine (TEGRETOL XR) 200 MG 12 hr tablet Take 1 tablet (200 mg total) by mouth at bedtime. Patient not taking: No sig reported 10/16/13   Rachael Fee, MD  hydrOXYzine (ATARAX/VISTARIL) 25 MG tablet Take 1 tablet (25 mg total) by mouth every 6 (six) hours as needed for anxiety (or CIWA score </= 10). Patient not taking: No sig reported 10/16/13   Rachael Fee, MD  lisinopril-hydrochlorothiazide (ZESTORETIC) 20-25 MG tablet Take 1 tablet by mouth daily. Patient not taking: No sig reported 08/31/20   Pokhrel, Rebekah Chesterfield, MD  oxyCODONE (ROXICODONE) 5 MG immediate release tablet Take 1 tablet (5 mg total) by mouth every 8 (eight) hours as needed. Patient not taking: No sig reported 08/29/20 08/29/21  Pokhrel, Rebekah Chesterfield, MD  prednisoLONE 5 MG TABS tablet Take 8 tablets (40 mg total) by mouth daily for 28 days, THEN 6 tablets (30 mg total) daily for 4 days, THEN 4 tablets (20 mg total) daily for 4 days, THEN 2 tablets (10 mg total) daily for 4 days, THEN 1 tablet (5 mg total) every 3 (three) days for 1 day. Patient not taking: Reported on 09/13/2020 08/29/20 10/09/20  Joycelyn Das, MD  traMADol (ULTRAM) 50 MG tablet Take 1 tablet (50 mg total) by mouth every 6 (six) hours as needed. Patient not taking: Reported on 09/13/2020 08/29/20   Joycelyn Das, MD    Allergies    Cashew nut (anacardium occidentale) skin test and Cashew nut oil  Review of Systems   Review of Systems  Constitutional: Positive for fatigue.  Respiratory: Positive for shortness of breath.   Cardiovascular: Positive for leg swelling.  Genitourinary:       Left groin pain  Neurological: Negative for weakness.  All other systems reviewed and are negative.   Physical Exam Updated Vital  Signs BP 118/62   Pulse (!) 117   Temp 99 F (37.2 C)   Resp (!) 28   Ht 5\' 10"  (1.778 m)   Wt 78.9 kg   SpO2 98%   BMI 24.97 kg/m   Physical Exam Vitals and nursing note reviewed. Exam conducted with a chaperone present.  Constitutional:      Appearance: He is ill-appearing.  HENT:     Head: Normocephalic and atraumatic.     Right Ear: External ear normal.  Left Ear: External ear normal.     Nose: Nose normal.     Mouth/Throat:     Mouth: Mucous membranes are dry.  Cardiovascular:     Rate and Rhythm: Regular rhythm. Tachycardia present.     Pulses: Normal pulses.     Heart sounds: Normal heart sounds.  Pulmonary:     Effort: Pulmonary effort is normal.     Breath sounds: Normal breath sounds.  Abdominal:     General: Abdomen is flat. Bowel sounds are normal.     Palpations: Abdomen is soft.  Genitourinary:    Penis: Normal.      Rectum: Guaiac result negative.  Musculoskeletal:        General: Normal range of motion.     Cervical back: Normal range of motion and neck supple.     Right lower leg: Edema present.     Left lower leg: Edema present.  Skin:    General: Skin is warm.     Capillary Refill: Capillary refill takes less than 2 seconds.     Coloration: Skin is pale.     Comments: Left upper and inner thigh cellulitis  Neurological:     General: No focal deficit present.     Mental Status: He is alert and oriented to person, place, and time.  Psychiatric:        Mood and Affect: Mood normal.        Behavior: Behavior normal.     ED Results / Procedures / Treatments   Labs (all labs ordered are listed, but only abnormal results are displayed) Labs Reviewed  COMPREHENSIVE METABOLIC PANEL - Abnormal; Notable for the following components:      Result Value   Sodium 128 (*)    Chloride 94 (*)    CO2 21 (*)    Glucose, Bld 112 (*)    Creatinine, Ser 0.59 (*)    Calcium 8.4 (*)    Albumin 2.2 (*)    Alkaline Phosphatase 156 (*)    Total Bilirubin  2.3 (*)    All other components within normal limits  CBC WITH DIFFERENTIAL/PLATELET - Abnormal; Notable for the following components:   WBC 10.6 (*)    RBC 1.78 (*)    Hemoglobin 6.2 (*)    HCT 18.9 (*)    MCV 106.2 (*)    MCH 34.8 (*)    nRBC 1.3 (*)    Neutro Abs 8.4 (*)    Abs Immature Granulocytes 0.50 (*)    All other components within normal limits  PROTIME-INR - Abnormal; Notable for the following components:   Prothrombin Time 16.5 (*)    INR 1.4 (*)    All other components within normal limits  LACTIC ACID, PLASMA - Abnormal; Notable for the following components:   Lactic Acid, Venous 3.3 (*)    All other components within normal limits  LACTIC ACID, PLASMA - Abnormal; Notable for the following components:   Lactic Acid, Venous 2.5 (*)    All other components within normal limits  CULTURE, BLOOD (ROUTINE X 2)  CULTURE, BLOOD (ROUTINE X 2)  RESP PANEL BY RT-PCR (FLU A&B, COVID) ARPGX2  AMMONIA  LACTATE DEHYDROGENASE  HAPTOGLOBIN  IRON AND TIBC  POC OCCULT BLOOD, ED  TYPE AND SCREEN  PREPARE RBC (CROSSMATCH)    EKG None  Radiology DG Chest 1 View  Result Date: 09/13/2020 CLINICAL DATA:  Leg swelling EXAM: CHEST  1 VIEW COMPARISON:  08/25/2020. FINDINGS: Mild hypoinflation. No pneumothorax, pleural  effusion or focal consolidation. Cardiomediastinal silhouette within normal limits. No acute osseous abnormality. IMPRESSION: No focal airspace disease. Electronically Signed   By: Stana Bunting M.D.   On: 09/13/2020 10:59   CT ABDOMEN PELVIS W CONTRAST  Result Date: 09/13/2020 CLINICAL DATA:  Hepatic cirrhosis. EXAM: CT ABDOMEN AND PELVIS WITH CONTRAST TECHNIQUE: Multidetector CT imaging of the abdomen and pelvis was performed using the standard protocol following bolus administration of intravenous contrast. CONTRAST:  OMNIPAQUE IOHEXOL 300 MG/ML  SOLN COMPARISON:  None. FINDINGS: Lower chest: No acute abnormality. Hepatobiliary: No gallstones or biliary  dilatation is noted. Mildly nodular hepatic contours are noted suggesting hepatic cirrhosis. No definite focal hepatic abnormality is noted. Pancreas: Unremarkable. No pancreatic ductal dilatation or surrounding inflammatory changes. Spleen: Normal in size without focal abnormality. Adrenals/Urinary Tract: Adrenal glands are unremarkable. Kidneys are normal, without renal calculi, focal lesion, or hydronephrosis. Bladder is unremarkable. Stomach/Bowel: Stomach is within normal limits. Appendix appears normal. No evidence of bowel wall thickening, distention, or inflammatory changes. Vascular/Lymphatic: No significant vascular findings are present. No enlarged abdominal or pelvic lymph nodes. Reproductive: Prostate is unremarkable. Other: There is the interval development of fluid collections within the musculature of the left ileo psoas muscle as well as the musculature of the visualized portion of the left gluteal region and proximal left thigh. Diffuse swelling is noted, and these findings are most consistent with innumerable abscesses that have formed within the musculature. Mild subcutaneous edema or inflammation is seen involving the visualized portions of the left upper thigh and hip region. Musculoskeletal: There is significant destruction of the left femoral head which may be related to chronic septic arthritis or inflammation. Left hip effusion is noted. IMPRESSION: 1. Findings consistent with hepatic cirrhosis. 2. Interval development of innumerable fluid collections are seen within the musculature of the left ileopsoas muscle as well as the musculature of the visualized portion of the left gluteal region and proximal left thigh consistent with innumerable abscesses that have formed within the musculature, several of which are quite large. Diffuse swelling is noted, and there is noted significant destruction of the left femoral head which may be related to chronic septic arthritis or inflammation. Left hip  effusion is noted. 3. Mild subcutaneous edema or inflammation is seen involving the visualized portions of the left upper thigh and hip region. Electronically Signed   By: Lupita Raider M.D.   On: 09/13/2020 12:23    Procedures Procedures   Medications Ordered in ED Medications  ceFEPIme (MAXIPIME) 2 g in sodium chloride 0.9 % 100 mL IVPB (has no administration in time range)  ceFAZolin (ANCEF) IVPB 1 g/50 mL premix (0 g Intravenous Stopped 09/13/20 1307)  vancomycin (VANCOREADY) IVPB 1750 mg/350 mL (0 mg Intravenous Stopped 09/13/20 1414)  0.9 %  sodium chloride infusion (Manually program via Guardrails IV Fluids) ( Intravenous New Bag/Given 09/13/20 1308)  iohexol (OMNIPAQUE) 300 MG/ML solution 100 mL (100 mLs Intravenous Contrast Given 09/13/20 1150)    ED Course  I have reviewed the triage vital signs and the nursing notes.  Pertinent labs & imaging results that were available during my care of the patient were reviewed by me and considered in my medical decision making (see chart for details).    MDM Rules/Calculators/A&P                          Pt's repeat hgb 6.2.  Due to this, I ordered 2 units for transfusion.  Pt  consented for transfusion.  Pt given ancef and vanc for the cellulitis I can see.  Lactic elevated.  IVFs ordered.  BP nl.  Results of CT scan reviewed with Surgery.  They did see him and request that I call ortho as well.   They will consult IR to see if they can drain any of the abscesses.  Pt d/w Dr. Dion Saucier (ortho) who will see pt in consult.    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 Joycelyn Man.  Pt d/w Dr. Ronaldo Miyamoto (triad) for admission.  Pt did have Covid on last admission.   CRITICAL CARE Performed by: Jacalyn Lefevre   Total critical care time: 60 minutes  Critical care time was exclusive of separately billable procedures and treating other patients.  Critical care was necessary to treat or  prevent imminent or life-threatening deterioration.  Critical care was time spent personally by me on the following activities: development of treatment plan with patient and/or surrogate as well as nursing, discussions with consultants, evaluation of patient's response to treatment, examination of patient, obtaining history from patient or surrogate, ordering and performing treatments and interventions, ordering and review of laboratory studies, ordering and review of radiographic studies, pulse oximetry and re-evaluation of patient's condition.  Final Clinical Impression(s) / ED Diagnoses Final diagnoses:  GI bleed  Symptomatic anemia  Cellulitis of left thigh  Alcoholic cirrhosis of liver without ascites (HCC)  Abscess of muscle    Rx / DC Orders ED Discharge Orders    None       Jacalyn Lefevre, MD 09/13/20 1440

## 2020-09-13 NOTE — ED Notes (Signed)
Family at bedside. 

## 2020-09-13 NOTE — ED Notes (Signed)
Per PCP, states history of ETOH abuse-PA saw patient yesterday-complaints of LE edema, blood blister under testicle, palpitations-sending patient to ED regarding low platelet count, Hgb 6.6

## 2020-09-13 NOTE — ED Notes (Signed)
Attempted to call report, no answer, will try back

## 2020-09-13 NOTE — ED Notes (Signed)
Blood consent completed.

## 2020-09-13 NOTE — ED Notes (Signed)
Report called to Jennifer

## 2020-09-14 ENCOUNTER — Inpatient Hospital Stay (HOSPITAL_COMMUNITY): Payer: 59

## 2020-09-14 DIAGNOSIS — A4101 Sepsis due to Methicillin susceptible Staphylococcus aureus: Secondary | ICD-10-CM | POA: Diagnosis not present

## 2020-09-14 DIAGNOSIS — M009 Pyogenic arthritis, unspecified: Secondary | ICD-10-CM

## 2020-09-14 DIAGNOSIS — F1029 Alcohol dependence with unspecified alcohol-induced disorder: Secondary | ICD-10-CM

## 2020-09-14 DIAGNOSIS — K6812 Psoas muscle abscess: Secondary | ICD-10-CM | POA: Diagnosis not present

## 2020-09-14 DIAGNOSIS — Z01818 Encounter for other preprocedural examination: Secondary | ICD-10-CM

## 2020-09-14 DIAGNOSIS — R7881 Bacteremia: Secondary | ICD-10-CM

## 2020-09-14 DIAGNOSIS — K7030 Alcoholic cirrhosis of liver without ascites: Secondary | ICD-10-CM

## 2020-09-14 DIAGNOSIS — Z8616 Personal history of COVID-19: Secondary | ICD-10-CM

## 2020-09-14 DIAGNOSIS — B9561 Methicillin susceptible Staphylococcus aureus infection as the cause of diseases classified elsewhere: Secondary | ICD-10-CM | POA: Diagnosis not present

## 2020-09-14 DIAGNOSIS — K7010 Alcoholic hepatitis without ascites: Secondary | ICD-10-CM | POA: Diagnosis not present

## 2020-09-14 LAB — CBC
HCT: 22.6 % — ABNORMAL LOW (ref 39.0–52.0)
Hemoglobin: 7.6 g/dL — ABNORMAL LOW (ref 13.0–17.0)
MCH: 33.9 pg (ref 26.0–34.0)
MCHC: 33.6 g/dL (ref 30.0–36.0)
MCV: 100.9 fL — ABNORMAL HIGH (ref 80.0–100.0)
Platelets: 140 10*3/uL — ABNORMAL LOW (ref 150–400)
RBC: 2.24 MIL/uL — ABNORMAL LOW (ref 4.22–5.81)
RDW: 16.8 % — ABNORMAL HIGH (ref 11.5–15.5)
WBC: 8.3 10*3/uL (ref 4.0–10.5)
nRBC: 0.6 % — ABNORMAL HIGH (ref 0.0–0.2)

## 2020-09-14 LAB — BLOOD CULTURE ID PANEL (REFLEXED) - BCID2

## 2020-09-14 LAB — ECHOCARDIOGRAM COMPLETE
Area-P 1/2: 5.84 cm2
Calc EF: 57.4 %
Height: 70 in
S' Lateral: 2.8 cm
Single Plane A2C EF: 62.8 %
Single Plane A4C EF: 59 %
Weight: 2784 oz

## 2020-09-14 LAB — COMPREHENSIVE METABOLIC PANEL
ALT: 32 U/L (ref 0–44)
AST: 30 U/L (ref 15–41)
Albumin: 2 g/dL — ABNORMAL LOW (ref 3.5–5.0)
Alkaline Phosphatase: 130 U/L — ABNORMAL HIGH (ref 38–126)
Anion gap: 8 (ref 5–15)
BUN: 16 mg/dL (ref 6–20)
CO2: 22 mmol/L (ref 22–32)
Calcium: 7.8 mg/dL — ABNORMAL LOW (ref 8.9–10.3)
Chloride: 101 mmol/L (ref 98–111)
Creatinine, Ser: 0.44 mg/dL — ABNORMAL LOW (ref 0.61–1.24)
GFR, Estimated: >60 mL/min (ref >60)
Glucose, Bld: 139 mg/dL — ABNORMAL HIGH (ref 70–99)
Potassium: 3.9 mmol/L (ref 3.5–5.1)
Sodium: 131 mmol/L — ABNORMAL LOW (ref 135–145)
Total Bilirubin: 2 mg/dL — ABNORMAL HIGH (ref 0.3–1.2)
Total Protein: 6 g/dL — ABNORMAL LOW (ref 6.5–8.1)

## 2020-09-14 LAB — PROTIME-INR
INR: 1.5 — ABNORMAL HIGH (ref 0.8–1.2)
Prothrombin Time: 18 seconds — ABNORMAL HIGH (ref 11.4–15.2)

## 2020-09-14 LAB — HAPTOGLOBIN: Haptoglobin: 328 mg/dL — ABNORMAL HIGH (ref 17–317)

## 2020-09-14 LAB — CORTISOL-AM, BLOOD: Cortisol - AM: 11.5 ug/dL (ref 6.7–22.6)

## 2020-09-14 LAB — PROCALCITONIN: Procalcitonin: 3 ng/mL

## 2020-09-14 MED ORDER — DIPHENHYDRAMINE HCL 50 MG/ML IJ SOLN
INTRAMUSCULAR | Status: AC | PRN
  Administered 2020-09-14: 25 mg via INTRAVENOUS

## 2020-09-14 MED ORDER — ENSURE ENLIVE PO LIQD: 237.0000 mL | Freq: Two times a day (BID) | ORAL | Status: DC

## 2020-09-14 MED ORDER — SODIUM CHLORIDE 0.9% FLUSH
5.0000 mL | Freq: Three times a day (TID) | INTRAVENOUS | Status: DC
  Administered 2020-09-14 – 2020-10-10 (×68): 5 mL

## 2020-09-14 MED ORDER — MIDAZOLAM HCL 2 MG/2ML IJ SOLN
INTRAMUSCULAR | Status: AC
  Filled 2020-09-14: qty 6

## 2020-09-14 MED ORDER — ADULT MULTIVITAMIN W/MINERALS CH
1.0000 | ORAL_TABLET | Freq: Every day | ORAL | Status: DC
  Administered 2020-09-15 – 2020-10-10 (×25): 1 via ORAL
  Filled 2020-09-14 (×25): qty 1

## 2020-09-14 MED ORDER — FENTANYL CITRATE (PF) 100 MCG/2ML IJ SOLN
INTRAMUSCULAR | Status: AC
  Filled 2020-09-14: qty 4

## 2020-09-14 MED ORDER — MAGNESIUM OXIDE 400 (241.3 MG) MG PO TABS
400.0000 mg | ORAL_TABLET | Freq: Every day | ORAL | Status: DC
  Administered 2020-09-14: 400 mg via ORAL
  Filled 2020-09-14: qty 1

## 2020-09-14 MED ORDER — DIPHENHYDRAMINE HCL 50 MG/ML IJ SOLN
INTRAMUSCULAR | Status: AC
  Filled 2020-09-14: qty 1

## 2020-09-14 MED ORDER — FENTANYL CITRATE (PF) 100 MCG/2ML IJ SOLN
INTRAMUSCULAR | Status: AC | PRN
  Administered 2020-09-14 (×4): 50 ug via INTRAVENOUS

## 2020-09-14 MED ORDER — CEFAZOLIN SODIUM-DEXTROSE 2-4 GM/100ML-% IV SOLN
2.0000 g | Freq: Three times a day (TID) | INTRAVENOUS | Status: DC
  Administered 2020-09-14 – 2020-10-01 (×52): 2 g via INTRAVENOUS
  Filled 2020-09-14 (×55): qty 100

## 2020-09-14 MED ORDER — ALPRAZOLAM 0.25 MG PO TABS
0.2500 mg | ORAL_TABLET | Freq: Once | ORAL | Status: AC
  Administered 2020-09-14: 0.25 mg via ORAL
  Filled 2020-09-14: qty 1

## 2020-09-14 MED ORDER — MIDAZOLAM HCL 2 MG/2ML IJ SOLN
INTRAMUSCULAR | Status: AC | PRN
  Administered 2020-09-14 (×4): 1 mg via INTRAVENOUS
  Administered 2020-09-14: 2 mg via INTRAVENOUS

## 2020-09-14 MED ORDER — LIDOCAINE-EPINEPHRINE 1 %-1:100000 IJ SOLN
INTRAMUSCULAR | Status: AC | PRN
  Administered 2020-09-14 (×2): 10 mL

## 2020-09-14 NOTE — Consult Note (Signed)
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 Alcoholic hepatitis: Currently on prolonged steroid taper with normalization of liver enzymes Alcoholic cirrhosis: Patient reports abstinence for the past 30 days Recent COVID-19 infection: Recent admission treated with remdesivir and steroids.  Covid PCR this admission negative  PLAN:    Continue cefazolin Echocardiogram pending.  Of note, echo done earlier this month did not reveal evidence of vegetations Please consult with gastroenterology regarding current steroid taper in the setting of his disseminated MSSA infection Appreciate interventional radiology drainage of fluid collections Appreciate orthopedic surgery evaluation.   Repeat blood cultures pending If TTE is negative would recommend transesophageal echo Will continue to follow  HPI:    [name] is a 35 y.o. male with recent admission at this hospital January 31 through February 7 where he was admitted with acute kidney injury thought to be multifactorial related to his antihypertensives, poor p.o. intake, Covid infection, and possible hepatorenal syndrome.  His creatinine improved throughout the course of admission and had normalized at the time of discharge to 0.99.  He also presented with severe thrombocytopenia with epistaxis on presentation thought to be  secondary to alcoholic liver disease as well as possible Covid infection.  His platelets prior to discharge were improved up to 112.  Platelets on admission this time are 170.  Patient also with elevated liver enzymes and jaundice at that time and seen by GI.  He was diagnosed with alcoholic hepatitis with AST 271 (now normalized at 40) and ALT 76 (now normalized at 42).  His T bili is mildly elevated at 2.0.  He received steroids during his admission both for alcoholic hepatitis but also COVID-19 infection.  At discharge he was given 40 mg daily prednisolone with plan to take this for 4 weeks.  Ultrasound imaging of his abdomen last admission showed increased hepatic parenchymal echogenicity consistent with steatosis.  No capsular nodularity or other findings of cirrhosis.  CT abdomen this admission shows mild nodular hepatic contours suggesting hepatic cirrhosis.   He now presents back to the hospital last evening with complaints of fatigue, weakness, and left thigh and leg pain in the setting of known avascular necrosis of the left hip.  He reports that his left thigh had become increasingly swollen and tender to the touch.  He had a blister rupture on the inner portion of his thigh towards the groin.  The symptoms continued to increase and he was no longer able to ambulate due to pain.  He saw his primary care the day prior to admission who drew labs and he was called that morning and advised to the go to the ED for further evaluation.  In the emergency department he was found to be anemic with a hemoglobin of 6.2.  CT imaging showed left iliopsoas and left gluteal abscesses with diffuse swelling and left hip effusion.  Orthopedic surgery and general surgery were  consulted and he was subsequently admitted.  He was placed on empiric IV antibiotics with vancomycin and cefepime.  General surgery did not recommend any acute interventions as his abscesses appeared intramuscular.  Orthopedic surgery recommended IR  to aspirate the iliopsoas fluid collections as well as the left hip.  Interventional radiology has seen him today where he underwent successful image guided placement of a 14 French drainage catheter into the left retroperitoneal abscess and two 12 French drains into the medial and lateral abscesses within the anterior aspect of the left thigh.  4 of 4 bottles are positive for MSSA and he has been narrowed to Cefazolin.    Past Medical History:  Diagnosis Date   Alcohol abuse    Asthma    Depression    Hypertension     Social History   Tobacco Use   Smoking status: Current Some Day Smoker    Types: Cigars   Smokeless tobacco: Never Used  Substance Use Topics   Alcohol use: Yes    Alcohol/week: 12.0 - 18.0 standard drinks    Types: 12 - 18 Cans of beer per week    Comment: heavy drinker   Drug use: Yes    Types: Marijuana    Family History  Problem Relation Age of Onset   Cirrhosis Neg Hx    Diabetes Mellitus II Neg Hx     Allergies  Allergen Reactions   Cashew Nut (Anacardium Occidentale) Skin Test Nausea And Vomiting    Allergy to cashews only; Tolerates other nuts   Cashew Nut Oil Nausea And Vomiting    Allergy to cashews only; Tolerates other nuts    Review of Systems  Constitutional: Positive for malaise/fatigue. Negative for chills and fever.  HENT: Negative.   Respiratory: Negative.   Cardiovascular: Negative.   Gastrointestinal: Negative for abdominal pain, nausea and vomiting.  Genitourinary: Negative.   Musculoskeletal: Positive for joint pain.  Skin: Positive for rash.  Neurological: Negative.   All other systems reviewed and are negative.   OBJECTIVE:   Blood pressure 105/61, pulse 96, temperature 98.5 F (36.9 C), temperature source Oral, resp. rate 14, height 5\' 10"  (1.778 m), weight 78.9 kg, SpO2 100 %. Body mass index is 24.97 kg/m.  Physical Exam Constitutional:      General: He is not in acute distress.    Appearance: Normal  appearance.  HENT:     Head: Normocephalic and atraumatic.  Eyes:     Extraocular Movements: Extraocular movements intact.     Conjunctiva/sclera: Conjunctivae normal.  Cardiovascular:     Rate and Rhythm: Normal rate and regular rhythm.     Heart sounds: No murmur heard.   Pulmonary:     Effort: Pulmonary effort is normal. No respiratory distress.     Breath sounds: Normal breath sounds.  Abdominal:     General: There is distension.     Palpations: Abdomen is soft.     Tenderness: There is no abdominal tenderness.  Musculoskeletal:        General: Tenderness present.     Right lower leg: Edema present.     Left lower leg: Edema present.  Skin:    General: Skin is warm and dry.     Findings: Erythema present.  Neurological:     General: No focal deficit present.     Mental Status: He is alert and oriented to person, place, and time.  Psychiatric:        Mood and Affect: Mood normal.  Behavior: Behavior normal.      Lab Results: Lab Results  Component Value Date   WBC 8.3 09/14/2020   HGB 7.6 (L) 09/14/2020   HCT 22.6 (L) 09/14/2020   MCV 100.9 (H) 09/14/2020   PLT 140 (L) 09/14/2020    Lab Results  Component Value Date   NA 131 (L) 09/14/2020   K 3.9 09/14/2020   CO2 22 09/14/2020   GLUCOSE 139 (H) 09/14/2020   BUN 16 09/14/2020   CREATININE 0.44 (L) 09/14/2020   CALCIUM 7.8 (L) 09/14/2020   GFRNONAA >60 09/14/2020   GFRAA >60 02/24/2019    Lab Results  Component Value Date   ALT 32 09/14/2020   AST 30 09/14/2020   ALKPHOS 130 (H) 09/14/2020   BILITOT 2.0 (H) 09/14/2020       Component Value Date/Time   CRP 12.0 (H) 08/27/2020 0454    No results found for: ESRSEDRATE  I have reviewed the micro and lab results in Epic.  Imaging: DG Chest 1 View  Result Date: 09/13/2020 CLINICAL DATA:  Leg swelling EXAM: CHEST  1 VIEW COMPARISON:  08/25/2020. FINDINGS: Mild hypoinflation. No pneumothorax, pleural effusion or focal consolidation.  Cardiomediastinal silhouette within normal limits. No acute osseous abnormality. IMPRESSION: No focal airspace disease. Electronically Signed   By: Stana Buntinghikanele  Emekauwa M.D.   On: 09/13/2020 10:59   CT ABDOMEN PELVIS W CONTRAST  Result Date: 09/13/2020 CLINICAL DATA:  Hepatic cirrhosis. EXAM: CT ABDOMEN AND PELVIS WITH CONTRAST TECHNIQUE: Multidetector CT imaging of the abdomen and pelvis was performed using the standard protocol following bolus administration of intravenous contrast. CONTRAST:  100mL OMNIPAQUE IOHEXOL 300 MG/ML  SOLN COMPARISON:  None. FINDINGS: Lower chest: No acute abnormality. Hepatobiliary: No gallstones or biliary dilatation is noted. Mildly nodular hepatic contours are noted suggesting hepatic cirrhosis. No definite focal hepatic abnormality is noted. Pancreas: Unremarkable. No pancreatic ductal dilatation or surrounding inflammatory changes. Spleen: Normal in size without focal abnormality. Adrenals/Urinary Tract: Adrenal glands are unremarkable. Kidneys are normal, without renal calculi, focal lesion, or hydronephrosis. Bladder is unremarkable. Stomach/Bowel: Stomach is within normal limits. Appendix appears normal. No evidence of bowel wall thickening, distention, or inflammatory changes. Vascular/Lymphatic: No significant vascular findings are present. No enlarged abdominal or pelvic lymph nodes. Reproductive: Prostate is unremarkable. Other: There is the interval development of fluid collections within the musculature of the left ileo psoas muscle as well as the musculature of the visualized portion of the left gluteal region and proximal left thigh. Diffuse swelling is noted, and these findings are most consistent with innumerable abscesses that have formed within the musculature. Mild subcutaneous edema or inflammation is seen involving the visualized portions of the left upper thigh and hip region. Musculoskeletal: There is significant destruction of the left femoral head which may  be related to chronic septic arthritis or inflammation. Left hip effusion is noted. IMPRESSION: 1. Findings consistent with hepatic cirrhosis. 2. Interval development of innumerable fluid collections are seen within the musculature of the left ileopsoas muscle as well as the musculature of the visualized portion of the left gluteal region and proximal left thigh consistent with innumerable abscesses that have formed within the musculature, several of which are quite large. Diffuse swelling is noted, and there is noted significant destruction of the left femoral head which may be related to chronic septic arthritis or inflammation. Left hip effusion is noted. 3. Mild subcutaneous edema or inflammation is seen involving the visualized portions of the left upper thigh and hip region. Electronically Signed  By: Lupita Raider M.D.   On: 09/13/2020 12:23     Imaging independently reviewed in Epic.  Vedia Coffer for Infectious Disease [facility] [phone] pager 09/14/2020, 12:59 PM  I spent greater than 110 minutes with the patient including greater than 50% of time in face to face counsel of the patient and in coordination of their care.

## 2020-09-14 NOTE — Progress Notes (Signed)
  Echocardiogram 2D Echocardiogram has been performed.  Janalyn Harder 09/14/2020, 4:01 PM

## 2020-09-14 NOTE — Progress Notes (Signed)
PHARMACY - PHYSICIAN COMMUNICATION CRITICAL VALUE ALERT - BLOOD CULTURE IDENTIFICATION (BCID)  [name] is an 35 y.o. male who presented to [facility] on 09/13/2020 with a chief complaint of thigh pain; found to have Left iliopsoas and gluteal abscesses  Assessment: 4/4 gram+ cocci, staph aureus, no resistance  Name of physician (or Provider) ContactedEvonnie Dawes  Current antibiotics: vancomycin and cefepime  Changes to prescribed antibiotics recommended:  Start ancef 2gm IV q8h, d/c vanc and cefepime  Results for orders placed or performed during the hospital encounter of 09/13/20  Blood Culture ID Panel (Reflexed) (Collected: 09/13/2020 10:45 AM)  Result Value Ref Range   Enterococcus faecalis NOT DETECTED NOT DETECTED   Enterococcus Faecium NOT DETECTED NOT DETECTED   Listeria monocytogenes NOT DETECTED NOT DETECTED   Staphylococcus species DETECTED (A) NOT DETECTED   Staphylococcus aureus (BCID) DETECTED (A) NOT DETECTED   Staphylococcus epidermidis NOT DETECTED NOT DETECTED   Staphylococcus lugdunensis NOT DETECTED NOT DETECTED   Streptococcus species NOT DETECTED NOT DETECTED   Streptococcus agalactiae NOT DETECTED NOT DETECTED   Streptococcus pneumoniae NOT DETECTED NOT DETECTED   Streptococcus pyogenes NOT DETECTED NOT DETECTED   A.calcoaceticus-baumannii NOT DETECTED NOT DETECTED   Bacteroides fragilis NOT DETECTED NOT DETECTED   Enterobacterales NOT DETECTED NOT DETECTED   Enterobacter cloacae complex NOT DETECTED NOT DETECTED   Escherichia coli NOT DETECTED NOT DETECTED   Klebsiella aerogenes NOT DETECTED NOT DETECTED   Klebsiella oxytoca NOT DETECTED NOT DETECTED   Klebsiella pneumoniae NOT DETECTED NOT DETECTED   Proteus species NOT DETECTED NOT DETECTED   Salmonella species NOT DETECTED NOT DETECTED   Serratia marcescens NOT DETECTED NOT DETECTED   Haemophilus influenzae NOT DETECTED NOT DETECTED   Neisseria meningitidis NOT DETECTED NOT DETECTED    Pseudomonas aeruginosa NOT DETECTED NOT DETECTED   Stenotrophomonas maltophilia NOT DETECTED NOT DETECTED   Candida albicans NOT DETECTED NOT DETECTED   Candida auris NOT DETECTED NOT DETECTED   Candida glabrata NOT DETECTED NOT DETECTED   Candida krusei NOT DETECTED NOT DETECTED   Candida parapsilosis NOT DETECTED NOT DETECTED   Candida tropicalis NOT DETECTED NOT DETECTED   Cryptococcus neoformans/gattii NOT DETECTED NOT DETECTED   Meth resistant mecA/C and MREJ NOT DETECTED NOT DETECTED    Arley Phenix RPh 09/14/2020, 3:58 AM

## 2020-09-14 NOTE — Progress Notes (Signed)
   09/14/20 2000  Assess: MEWS Score  Temp 98.5 F (36.9 C)  Level of Consciousness Alert  Assess: MEWS Score  MEWS Temp 0  MEWS Systolic 0  MEWS Pulse 1  MEWS RR 1  MEWS LOC 0  MEWS Score 2  MEWS Score Color Yellow  Assess: if the MEWS score is Yellow or Red  Were vital signs taken at a resting state? Yes  Focused Assessment No change from prior assessment  Early Detection of Sepsis Score *See Row Information* Low  MEWS guidelines implemented *See Row Information* Yes  Treat  MEWS Interventions Administered scheduled meds/treatments  Pain Scale 0-10  Pain Score 7  Pain Type Acute pain  Pain Location Groin  Pain Orientation Left  Pain Descriptors / Indicators Aching;Constant  Pain Frequency Constant  Pain Onset On-going  Patients Stated Pain Goal 2  Pain Intervention(s) Medication (See eMAR)  Escalate  MEWS: Escalate Yellow: discuss with charge nurse/RN and consider discussing with provider and RRT  Notify: Charge Nurse/RN  Name of Charge Nurse/RN Notified Michelle, RN  Date Charge Nurse/RN Notified 09/14/20  Time Charge Nurse/RN Notified 2005  Document  Patient Outcome Other (Comment) (remains on unit)  Progress note created (see row info) Yes

## 2020-09-14 NOTE — Progress Notes (Signed)
Initial Nutrition Assessment  INTERVENTION:   -Ensure Enlive po BID, each supplement provides 350 kcal and 20 grams of protein  -Multivitamin with minerals daily  NUTRITION DIAGNOSIS:   Increased nutrient needs related to chronic illness as evidenced by estimated needs.  GOAL:   Patient will meet greater than or equal to 90% of their needs  MONITOR:   PO intake,Supplement acceptance,Labs,Weight trends,I & O's  REASON FOR ASSESSMENT:   Malnutrition Screening Tool    ASSESSMENT:   35 y.o. male who complains of acute on chronic left hip pain. He was scheduled for hip replacement surgery in early March, and has had a series of progressive medical complications including thrombocytopenia, liver failure, COVID-19, progressive inability to ambulate, as well as kidney failure.  He has had progressive edema in the left lower extremity.  He denies fevers or chills.  He denies IV drug use.  He has been decreasing his alcohol intake in preparation for his hip replacement surgery.  He has progressed now to the point where he cannot place weight on his left lower extremity, and is unable to function.  He was brought into the emergency room with severe anemia, requiring blood transfusion, jaundice, and left lower extremity swelling.  Patient having CT guided aspiration/drainage of left thigh fluid collections in IR. Per chart review, pt with history of ETOH abuse, reported last intake was prior to previous admission in the beginning of February. Treated for COVID-19 at that time.  Will order protein supplements for after procedure.  Per weight records, pt has lost 14 lbs since 2/7 (7% wt loss x 2 weeks, significant for time frame). Fluid may be a factor with this weight loss.  I/Os: -1.2L since admit UOP: 1750 ml x 24 hrs Drains: 1200 ml   Labs reviewed: Low Na   NUTRITION - FOCUSED PHYSICAL EXAM:  Unable to complete  Diet Order:   Diet Order            Diet regular Room service  appropriate? Yes; Fluid consistency: Thin  Diet effective now                 EDUCATION NEEDS:   No education needs have been identified at this time  Skin:  Skin Assessment: Reviewed RN Assessment  Last BM:  2/21  Height:   Ht Readings from Last 1 Encounters:  09/13/20 5\' 10"  (1.778 m)    Weight:   Wt Readings from Last 1 Encounters:  09/13/20 78.9 kg    BMI:  Body mass index is 24.97 kg/m.  Estimated Nutritional Needs:   Kcal:  2000-2200  Protein:  100-115g  Fluid:  2L/day   09/15/20, MS, RD, LDN Inpatient Clinical Dietitian Contact information available via Amion

## 2020-09-14 NOTE — Progress Notes (Addendum)
Subjective:   Patient is alert, oriented sitting up in bed. Patient reports pain to be moderate to severe this morning at left hip. Denies chest pain, SOB, Calf pain. No nausea/vomiting. No lightheadedness or dizziness. No other complaints.   Objective:  PE: VITALS:   Vitals:   09/13/20 2200 09/13/20 2300 09/14/20 0015 09/14/20 0349  BP: 120/76 122/82    Pulse:   (!) 108   Resp: 15 17    Temp:   98.1 F (36.7 C) 98.5 F (36.9 C)  TempSrc:   Oral Oral  SpO2:      Weight:      Height:       General: Alert, oriented, sitting up in bed. MSK: LLE edema at groin, thigh, and lower leg. Groin is erythematous at groin. All compartments compressible. TTP to groin, thigh, lower leg. Dorsiflexion and plantarflexion intact. Foot warm and well perfused. Distal sensation intact.    LABS  Results for orders placed or performed during the hospital encounter of 09/13/20 (from the past 24 hour(s))  Lactic acid, plasma     Status: Abnormal   Collection Time: 09/13/20 10:30 AM  Result Value Ref Range   Lactic Acid, Venous 3.3 (HH) 0.5 - 1.9 mmol/L  POC occult blood, ED Provider will collect     Status: None   Collection Time: 09/13/20 10:37 AM  Result Value Ref Range   Fecal Occult Bld NEGATIVE NEGATIVE  Comprehensive metabolic panel     Status: Abnormal   Collection Time: 09/13/20 10:45 AM  Result Value Ref Range   Sodium 128 (L) 135 - 145 mmol/L   Potassium 4.3 3.5 - 5.1 mmol/L   Chloride 94 (L) 98 - 111 mmol/L   CO2 21 (L) 22 - 32 mmol/L   Glucose, Bld 112 (H) 70 - 99 mg/dL   BUN 17 6 - 20 mg/dL   Creatinine, Ser 3.41 (L) 0.61 - 1.24 mg/dL   Calcium 8.4 (L) 8.9 - 10.3 mg/dL   Total Protein 6.7 6.5 - 8.1 g/dL   Albumin 2.2 (L) 3.5 - 5.0 g/dL   AST 40 15 - 41 U/L   ALT 42 0 - 44 U/L   Alkaline Phosphatase 156 (H) 38 - 126 U/L   Total Bilirubin 2.3 (H) 0.3 - 1.2 mg/dL   GFR, Estimated >93 >79 mL/min   Anion gap 13 5 - 15  CBC WITH DIFFERENTIAL     Status: Abnormal    Collection Time: 09/13/20 10:45 AM  Result Value Ref Range   WBC 10.6 (H) 4.0 - 10.5 K/uL   RBC 1.78 (L) 4.22 - 5.81 MIL/uL   Hemoglobin 6.2 (LL) 13.0 - 17.0 g/dL   HCT 02.4 (L) 09.7 - 35.3 %   MCV 106.2 (H) 80.0 - 100.0 fL   MCH 34.8 (H) 26.0 - 34.0 pg   MCHC 32.8 30.0 - 36.0 g/dL   RDW 29.9 24.2 - 68.3 %   Platelets 170 150 - 400 K/uL   nRBC 1.3 (H) 0.0 - 0.2 %   Neutrophils Relative % 79 %   Neutro Abs 8.4 (H) 1.7 - 7.7 K/uL   Lymphocytes Relative 14 %   Lymphs Abs 1.5 0.7 - 4.0 K/uL   Monocytes Relative 2 %   Monocytes Absolute 0.2 0.1 - 1.0 K/uL   Eosinophils Relative 0 %   Eosinophils Absolute 0.0 0.0 - 0.5 K/uL   Basophils Relative 0 %   Basophils Absolute 0.0 0.0 - 0.1 K/uL  WBC Morphology MILD LEFT SHIFT (1-5% METAS, OCC MYELO, OCC BANDS)    Immature Granulocytes 5 %   Abs Immature Granulocytes 0.50 (H) 0.00 - 0.07 K/uL   Tear Drop Cells PRESENT    Polychromasia PRESENT   Protime-INR     Status: Abnormal   Collection Time: 09/13/20 10:45 AM  Result Value Ref Range   Prothrombin Time 16.5 (H) 11.4 - 15.2 seconds   INR 1.4 (H) 0.8 - 1.2  Type and screen [facility]     Status: None   Collection Time: 09/13/20 10:45 AM  Result Value Ref Range   ABO/RH(D) O NEG    Antibody Screen NEG    Sample Expiration 09/16/2020,2359    Unit Number [id number]    Blood Component Type RED CELLS,LR    Unit division 00    Status of Unit ISSUED,FINAL    Transfusion Status OK TO TRANSFUSE    Crossmatch Result      Compatible Performed at [facility], [address] [place], [place] [address]    Unit Number [id number]    Blood Component Type RED CELLS,LR    Unit division 00    Status of Unit ISSUED,FINAL    Transfusion Status OK TO TRANSFUSE    Crossmatch Result Compatible   Culture, blood (routine x 2)     Status: None (Preliminary result)   Collection Time: 09/13/20 10:45 AM   Specimen: BLOOD  Result Value Ref Range   Specimen  Description      BLOOD RIGHT ANTECUBITAL Performed at [facility], [address] [place], [place] [address]    Special Requests      BOTTLES DRAWN AEROBIC AND ANAEROBIC Blood Culture adequate volume Performed at [facility], [address] [place], [place] [address]    Culture  Setup Time      IN BOTH AEROBIC AND ANAEROBIC BOTTLES GRAM POSITIVE COCCI Organism ID to follow CRITICAL RESULT CALLED TO, READ BACK BY AND VERIFIED WITH: E JACKSON PHARMD 09/13/20 0347 JDW    Culture      NO GROWTH < 24 HOURS Performed at [facility], [address], [place], [place] [address]    Report Status PENDING   Prepare RBC (crossmatch)     Status: None   Collection Time: 09/13/20 10:45 AM  Result Value Ref Range   Order Confirmation      ORDER PROCESSED BY BLOOD BANK Performed at [facility], [address] [place], [place] [address]   Blood Culture ID Panel (Reflexed)     Status: Abnormal   Collection Time: 09/13/20 10:45 AM  Result Value Ref Range   Enterococcus faecalis NOT DETECTED NOT DETECTED   Enterococcus Faecium NOT DETECTED NOT DETECTED   Listeria monocytogenes NOT DETECTED NOT DETECTED   Staphylococcus species DETECTED (A) NOT DETECTED   Staphylococcus aureus (BCID) DETECTED (A) NOT DETECTED   Staphylococcus epidermidis NOT DETECTED NOT DETECTED   Staphylococcus lugdunensis NOT DETECTED NOT DETECTED   Streptococcus species NOT DETECTED NOT DETECTED   Streptococcus agalactiae NOT DETECTED NOT DETECTED   Streptococcus pneumoniae NOT DETECTED NOT DETECTED   Streptococcus pyogenes NOT DETECTED NOT DETECTED   A.calcoaceticus-baumannii NOT DETECTED NOT DETECTED   Bacteroides fragilis NOT DETECTED NOT DETECTED   Enterobacterales NOT DETECTED NOT DETECTED   Enterobacter cloacae complex NOT DETECTED NOT DETECTED   Escherichia coli NOT DETECTED NOT DETECTED   Klebsiella aerogenes NOT DETECTED NOT DETECTED   Klebsiella  oxytoca NOT DETECTED NOT DETECTED   Klebsiella pneumoniae NOT DETECTED NOT DETECTED   Proteus species NOT DETECTED NOT DETECTED   Salmonella species NOT DETECTED NOT DETECTED   Serratia marcescens NOT DETECTED NOT DETECTED   Haemophilus influenzae NOT DETECTED NOT DETECTED   Neisseria meningitidis NOT DETECTED NOT DETECTED   Pseudomonas aeruginosa NOT DETECTED NOT DETECTED   Stenotrophomonas maltophilia NOT DETECTED NOT DETECTED   Candida albicans NOT DETECTED NOT DETECTED   Candida auris NOT DETECTED NOT DETECTED   Candida glabrata NOT DETECTED NOT DETECTED   Candida krusei NOT DETECTED NOT DETECTED   Candida parapsilosis NOT DETECTED NOT DETECTED   Candida tropicalis NOT DETECTED NOT DETECTED   Cryptococcus neoformans/gattii NOT DETECTED NOT DETECTED   Meth resistant mecA/C and MREJ NOT DETECTED NOT DETECTED  Culture, blood (routine x 2)     Status: None (Preliminary result)   Collection Time: 09/13/20 11:20 AM   Specimen: BLOOD  Result Value Ref Range   Specimen Description      BLOOD RIGHT ANTECUBITAL Performed at [facility], [address] [place], [place] [address]    Special Requests      BOTTLES DRAWN AEROBIC AND ANAEROBIC Blood Culture adequate volume Performed at [facility], [address] [place], [place] [address]    Culture  Setup Time      IN BOTH AEROBIC AND ANAEROBIC BOTTLES GRAM POSITIVE COCCI CRITICAL VALUE NOTED.  VALUE IS CONSISTENT WITH PREVIOUSLY REPORTED AND CALLED VALUE.    Culture      NO GROWTH < 24 HOURS Performed at [facility], [address] [place], [place] [address]    Report Status PENDING   Lactic acid, plasma     Status: Abnormal   Collection Time: 09/13/20  1:40 PM  Result Value Ref Range   Lactic Acid, Venous 2.5 (HH) 0.5 - 1.9 mmol/L  Resp Panel by RT-PCR (Flu A&B, Covid) Nasopharyngeal Swab     Status: None   Collection Time: 09/13/20  2:01 PM   Specimen: Nasopharyngeal Swab;  Nasopharyngeal(NP) swabs in vial transport medium  Result Value Ref Range   SARS Coronavirus 2 by RT PCR NEGATIVE NEGATIVE   Influenza A by PCR NEGATIVE NEGATIVE   Influenza B by PCR NEGATIVE NEGATIVE  Lactate dehydrogenase     Status: Abnormal   Collection Time: 09/13/20  2:50 PM  Result Value Ref Range   LDH 308 (H) 98 - 192 U/L  Haptoglobin     Status: Abnormal   Collection Time: 09/13/20  2:50 PM  Result Value Ref Range   Haptoglobin 328 (H) 17 - 317 mg/dL  Iron and TIBC     Status: Abnormal   Collection Time: 09/13/20  2:50 PM  Result Value Ref Range   Iron 44 (L) 45 - 182 ug/dL   TIBC 213175 (L) 086250 - 578450 ug/dL   Saturation Ratios 25 17.9 - 39.5 %   UIBC 131 ug/dL  Ammonia     Status: Abnormal   Collection Time: 09/13/20  3:20 PM  Result Value Ref Range   Ammonia 45 (H) 9 - 35 umol/L  Hemoglobin and hematocrit, blood     Status: Abnormal   Collection Time: 09/13/20  6:21 PM  Result Value Ref Range   Hemoglobin 6.3 (LL) 13.0 - 17.0 g/dL   HCT 46.919.0 (L) 62.939.0 - 52.852.0 %  Protime-INR     Status: Abnormal   Collection Time: 09/14/20  5:25 AM  Result Value Ref  Range   Prothrombin Time 18.0 (H) 11.4 - 15.2 seconds   INR 1.5 (H) 0.8 - 1.2  Cortisol-am, blood     Status: None   Collection Time: 09/14/20  5:25 AM  Result Value Ref Range   Cortisol - AM 11.5 6.7 - 22.6 ug/dL  Procalcitonin     Status: None   Collection Time: 09/14/20  5:25 AM  Result Value Ref Range   Procalcitonin 3.00 ng/mL  Comprehensive metabolic panel     Status: Abnormal   Collection Time: 09/14/20  5:25 AM  Result Value Ref Range   Sodium 131 (L) 135 - 145 mmol/L   Potassium 3.9 3.5 - 5.1 mmol/L   Chloride 101 98 - 111 mmol/L   CO2 22 22 - 32 mmol/L   Glucose, Bld 139 (H) 70 - 99 mg/dL   BUN 16 6 - 20 mg/dL   Creatinine, Ser 5.00 (L) 0.61 - 1.24 mg/dL   Calcium 7.8 (L) 8.9 - 10.3 mg/dL   Total Protein 6.0 (L) 6.5 - 8.1 g/dL   Albumin 2.0 (L) 3.5 - 5.0 g/dL   AST 30 15 - 41 U/L   ALT 32 0 - 44 U/L    Alkaline Phosphatase 130 (H) 38 - 126 U/L   Total Bilirubin 2.0 (H) 0.3 - 1.2 mg/dL   GFR, Estimated >93 >81 mL/min   Anion gap 8 5 - 15  CBC     Status: Abnormal   Collection Time: 09/14/20  5:25 AM  Result Value Ref Range   WBC 8.3 4.0 - 10.5 K/uL   RBC 2.24 (L) 4.22 - 5.81 MIL/uL   Hemoglobin 7.6 (L) 13.0 - 17.0 g/dL   HCT 82.9 (L) 93.7 - 16.9 %   MCV 100.9 (H) 80.0 - 100.0 fL   MCH 33.9 26.0 - 34.0 pg   MCHC 33.6 30.0 - 36.0 g/dL   RDW 67.8 (H) 93.8 - 10.1 %   Platelets 140 (L) 150 - 400 K/uL   nRBC 0.6 (H) 0.0 - 0.2 %    DG Chest 1 View  Result Date: 09/13/2020 CLINICAL DATA:  Leg swelling EXAM: CHEST  1 VIEW COMPARISON:  08/25/2020. FINDINGS: Mild hypoinflation. No pneumothorax, pleural effusion or focal consolidation. Cardiomediastinal silhouette within normal limits. No acute osseous abnormality. IMPRESSION: No focal airspace disease. Electronically Signed   By: Stana Bunting M.D.   On: 09/13/2020 10:59   CT ABDOMEN PELVIS W CONTRAST  Result Date: 09/13/2020 CLINICAL DATA:  Hepatic cirrhosis. EXAM: CT ABDOMEN AND PELVIS WITH CONTRAST TECHNIQUE: Multidetector CT imaging of the abdomen and pelvis was performed using the standard protocol following bolus administration of intravenous contrast. CONTRAST:  OMNIPAQUE IOHEXOL 300 MG/ML  SOLN COMPARISON:  None. FINDINGS: Lower chest: No acute abnormality. Hepatobiliary: No gallstones or biliary dilatation is noted. Mildly nodular hepatic contours are noted suggesting hepatic cirrhosis. No definite focal hepatic abnormality is noted. Pancreas: Unremarkable. No pancreatic ductal dilatation or surrounding inflammatory changes. Spleen: Normal in size without focal abnormality. Adrenals/Urinary Tract: Adrenal glands are unremarkable. Kidneys are normal, without renal calculi, focal lesion, or hydronephrosis. Bladder is unremarkable. Stomach/Bowel: Stomach is within normal limits. Appendix appears normal. No evidence of bowel wall  thickening, distention, or inflammatory changes. Vascular/Lymphatic: No significant vascular findings are present. No enlarged abdominal or pelvic lymph nodes. Reproductive: Prostate is unremarkable. Other: There is the interval development of fluid collections within the musculature of the left ileo psoas muscle as well as the musculature of the visualized portion of the  left gluteal region and proximal left thigh. Diffuse swelling is noted, and these findings are most consistent with innumerable abscesses that have formed within the musculature. Mild subcutaneous edema or inflammation is seen involving the visualized portions of the left upper thigh and hip region. Musculoskeletal: There is significant destruction of the left femoral head which may be related to chronic septic arthritis or inflammation. Left hip effusion is noted. IMPRESSION: 1. Findings consistent with hepatic cirrhosis. 2. Interval development of innumerable fluid collections are seen within the musculature of the left ileopsoas muscle as well as the musculature of the visualized portion of the left gluteal region and proximal left thigh consistent with innumerable abscesses that have formed within the musculature, several of which are quite large. Diffuse swelling is noted, and there is noted significant destruction of the left femoral head which may be related to chronic septic arthritis or inflammation. Left hip effusion is noted. 3. Mild subcutaneous edema or inflammation is seen involving the visualized portions of the left upper thigh and hip region. Electronically Signed   By: Lupita Raider M.D.   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 Dion Saucier has contacted and spoke with patient's primary orthopedist Dr. Darnell Level and also IR team regarding next steps - plan will be for IR to place drain iliopsoas abscess today  -  will post patient for left hip interposition arthoplasty tomorrow afternoon with Dr. Dion Saucier - continue antibiotics as per primary - will make NPO after midnight tonight  Addendum: Risks benefits and alternatives of a left hip arthoplasty with cement interposition spacer were reviewed with patient and girlfriend over the phone. All questions were answered. Patient agreeable to moving forward with surgery tomorrow.   Contact information:   Weekdays 8-5 [address], [place] [phone] A fter hours and holidays please check [email or web address] for group call information for [facility]  Armida Sans 09/14/2020, 8:38 AM

## 2020-09-14 NOTE — Procedures (Signed)
Pre procedural Dx: Septic effusion of the left hip and associated RP and thigh abscesses Post procedural Dx: Same  Technically successful [place] and CT guided placement of a 14 Fr drainage catheter into left retroperitoneal abscess and two 12 Fr drains into medial and lateral abscesses within the anterior aspect of the left thigh.  A total of 1.3 L of purulent fluid was aspirated following drain placement. A representative aspirated sample was capped and sent to the laboratory for analysis.    EBL: Trace Complications: None immediate  Katherina Right, MD Pager #: [phone]

## 2020-09-14 NOTE — Progress Notes (Signed)
PROGRESS NOTE    [name]  ERX:[id number] DOB: 08-22-1985 DOA: 09/13/2020 PCP: [name], PA   Brief Narrative:  HPI per Dr. Cherylann Ratel on 09/13/20 [name] is a 35 y.o. male with medical history significant of AVN of hip, HTN, GERD. Presenting with fatigue and left leg pain. Recent admission for COVID (08/22/20). Successfully completed therapy and was discharged on 08/29/20. At that time he was discharged to treatment for alcoholic hepatitis as well. He was ok for a few days after discharge, but he began to feel very tired and weak. He did not try any medicines to help. He tried rest. This progressed over these last 2 weeks. Also during this time, he began having left thigh and leg pain. He reports that the left thigh had become increasingly swollen and tender to touch. He has had a blister rupture on the inner thigh in the groin area. These symptoms increased through 2 days ago when he was no longer ago to walk without great pain. He saw his PCP yesterday who drew labs. He was called this morning by that team and advised to go to the ED immediately. He denies any other aggravating or alleviating factors.   ED Course: He was found to have a Hgb of 6.2. pRBCs were ordered. He was FOBT negative. CT imaging showed left iliopsoas and left gluteal abscesses w/ diffuse swelling and left hip effusion. CCS was consulted. Patient was started on cefazolin and vanc. TRH was called for admission.   **Interim History  Patient now has an MSSA bacteremia so ID was consulted and blood cultures repeated an echocardiogram was obtained.  Because of his abscesses and his iliopsoas muscle and multiple normal abscesses in the legs he underwent 3 drain placement by IR.  Orthopedic surgery is evaluating and planning for surgical intervention tomorrow.  Blood count is improved after 2 units of PRBCs  Assessment & Plan:   Principal Problem:   MSSA bacteremia Active Problems:   Alcohol dependence  ([place])   Alcoholic hepatitis   Sepsis ([place])   Septic arthritis ([place])   Iliopsoas abscess on left (H[facility])  Iliopsoas and left gluteal abscesses associated with MSSA Bacteremia  Sepsis secondary to above Left hip effusion and septic arthritis in the setting of avascular necrosis -Admitted to Inpatient Telemetry  -Initially started on IV vanc, cefepime; pharmacy to dose but changed to IV Cefazolin by ID -Repeat Blood Cx x2 and obtain ECHO -Started  -Startedon fluids -Sepsis Criteria met with: tachycardia, tachypnea, lactic acid 3.3, and Leukocytosis was 10.6 source: abscesses as above as well as bacteremia  -PCT was 3.00 -General surgery recs IR involvement for possible abscess drainage and for ortho to review ([facility]S consulted IR, EDP has consulted ortho); appreciate assistance, will await further recs -Surgery had planned a total hip replacement early March and plan is to currently drain iliopsoas abscess today and regular pulse the patient for a left hip interposition arthroplasty tomorrow with Dr. Mardelle Matte and will continue antibiotics and they will make the patient n.p.o. at midnight today -Interventional radiology 3 drains in place today via CT-guided placement with a 14 French drainage catheter into the left retroperitoneal abscess into 12 French drains in the medial and lateral abscesses with the anterior aspect of left thigh and there is a total of 1.3 L of purulent fluid that was aspirated following drain placement sent for evaluation -Infectious diseases consulted and recommending changing antibiotic to cefazolin and following repeat echocardiogram -Repeat blood cultures have been done  and if TTE is negative they can recommend a transesophageal echocardiogram then  Symptomatic Anemia Macrocytic Anemia -Presented with Hgb/Hct of 6.2/18.9 -Patient is s/p 2 units pRBCs -FOBT negative -Anemia panel done and showed an iron level of 44, U IBC 131, TIBC 175, saturation ratios of 25% -check LDH,  haptoglobin, iron studies -Continue to monitor for signs and symptoms of bleeding; currently no overt bleeding noted next- -Pete CBC in a.m.  Hx of Alcoholic Hepatitis Hepatic cirrhosis Hyperbilirubinemia -Was continiung Prednisolone (he is on 68m through 09/26/20 and then tapers) but given his disseminated MSSA bacteremia will discontinue taper given LFTs being improved and because T Bili Trending down. I spoke with Dr. KTherisa Doyneof Gastroenterology who feels the patient does not need taper now  -Patient and family report he has been alcohol free for 1 month -Continue lactulose 10 g p.o. twice daily, thiamine, magnesium -Patient's ammonia level was 45 yesterday not repeated today but will repeat again in the morning as well as continuing to monitor his PT and INR -Continue monitor hepatic function daily and if worsening will call GI for further assistance but at this time will discontinue the prednisone taper  Hx of Tachycardia HTN  -Continue Carvedilol 3.125 mg p.o. twice daily  GAD -Continue sertraline 25 mg p.o. nightly  GERD -C/w Pantoprazole 40 mg p.o. twice daily  Hyponatremia -Was initiated on IV fluids with normal saline but this is now stopped  -Sodium is trending up and went from 128 and is now 131  DVT prophylaxis: SCDs Code Status: FULL CODE  Family Communication: Discussed with mother at bedside  Disposition Plan: Pending further clinical improvement and evaluation and clearance by specialist  Status is: Inpatient  Remains inpatient appropriate because:Unsafe d/c plan, IV treatments appropriate due to intensity of illness or inability to take PO and Inpatient level of care appropriate due to severity of illness   Dispo: The patient is from: Home              Anticipated d/c is to: TBD              Anticipated d/c date is: 3 days              Patient currently is not medically stable to d/c.   Difficult to place patient No   Consultants:   General  Surgery  Orthopedic Surgery  Infectious Diseases  Interventional Radiology  Case was discussed with Gastroenterology    Procedures:  ECHOCARDIOGRAM   DRAIN PLACEMENT  Septic effusion of the left hip and associated RP and thigh abscesses Post procedural Dx: Same  Technically successful [place] CT guided placement of a 14 Fr drainage catheter into left retroperitoneal abscess and two 12 Fr drains into medial and lateral abscesses within the anterior aspect of the left thigh.  A total of 1.3 L of purulent fluid was aspirated following drain placement. A representative aspirated sample was capped and sent to the laboratory for analysis.    Antimicrobials:  Anti-infectives (From admission, onward)   Start     Dose/Rate Route Frequency Ordered Stop   09/14/20 0600  ceFAZolin (ANCEF) IVPB 2g/100 mL premix        2 g 200 mL/hr over 30 Minutes Intravenous Every 8 hours 09/14/20 0359     09/13/20 2200  vancomycin (VANCOREADY) IVPB 1500 mg/300 mL  Status:  Discontinued        1,500 mg 150 mL/hr over 120 Minutes Intravenous Every 12 hours 09/13/20 1636 09/14/20 0359  09/13/20 1500  ceFEPIme (MAXIPIME) 2 g in sodium chloride 0.9 % 100 mL IVPB  Status:  Discontinued        2 g 200 mL/hr over 30 Minutes Intravenous Every 8 hours 09/13/20 1437 09/14/20 0359   09/13/20 1130  vancomycin (VANCOREADY) IVPB 1750 mg/350 mL        1,750 mg 175 mL/hr over 120 Minutes Intravenous  Once 09/13/20 1107 09/13/20 1414   09/13/20 1045  ceFAZolin (ANCEF) IVPB 1 g/50 mL premix        1 g 100 mL/hr over 30 Minutes Intravenous  Once 09/13/20 1031 09/13/20 1307        Subjective: Seen and examined at bedside and was continued to have significant left leg pain.  No nausea or vomiting.  Denies any lightheadedness or dizziness.  Left leg is much more swollen compared to the right. patient feels a little bit better after the blood was given.  No other concerns or complaints this time.  Objective: Vitals:    09/14/20 1225 09/14/20 1230 09/14/20 1310 09/14/20 1433  BP: 113/71 105/61 117/80 116/82  Pulse: 96 96  (!) 107  Resp: 12 14  (!) 21  Temp:   97.9 F (36.6 C) 98.4 F (36.9 C)  TempSrc:   Oral Oral  SpO2: 100% 100%  99%  Weight:      Height:        Intake/Output Summary (Last 24 hours) at 09/14/2020 1539 Last data filed at 09/14/2020 1223 Gross per 24 hour  Intake 1658.4 ml  Output 2950 ml  Net -1291.6 ml   Filed Weights   09/13/20 0905  Weight: 78.9 kg   Examination: Physical Exam:  Constitutional: WN/WD Caucasian male in NAD Eyes: Lids and conjunctivae normal, sclerae anicteric  ENMT: External Ears, Nose appear normal. Grossly normal hearing. Mucous membranes are moist.  Neck: Appears normal, supple, no cervical masses, normal ROM, no appreciable thyromegaly; no JVD Respiratory: Diminished to auscultation bilaterally, no wheezing, rales, rhonchi or crackles. Normal respiratory effort and patient is not tachypenic. No accessory muscle use.  Unlabored breathing Cardiovascular: RRR, no murmurs / rubs / gallops. S1 and S2 auscultated.  Left leg is significantly more swollen compared to the right and does have some pitting edema is painful to touch Abdomen: Soft, non-tender, non-distended. Bowel sounds positive.  GU: Deferred. Musculoskeletal: No clubbing / cyanosis of digits/nails. No joint deformity upper and lower extremities.  Skin: No rashes, lesions, ulcers on limited skin evaluation. No induration; Warm and dry.  Neurologic: CN 2-12 grossly intact with no focal deficits. Romberg sign and cerebellar reflexes not assessed.  Psychiatric: Normal judgment and insight. Alert and oriented x 3. Normal mood and appropriate affect.   Data Reviewed: I have personally reviewed following labs and imaging studies  CBC: Recent Labs  Lab 09/13/20 1045 09/13/20 1821 09/14/20 0525  WBC 10.6*  --  8.3  NEUTROABS 8.4*  --   --   HGB 6.2* 6.3* 7.6*  HCT 18.9* 19.0* 22.6*  MCV  106.2*  --  100.9*  PLT 170  --  536*   Basic Metabolic Panel: Recent Labs  Lab 09/13/20 1045 09/14/20 0525  NA 128* 131*  K 4.3 3.9  CL 94* 101  CO2 21* 22  GLUCOSE 112* 139*  BUN 17 16  CREATININE 0.59* 0.44*  CALCIUM 8.4* 7.8*   GFR: Estimated Creatinine Clearance: 131.8 mL/min (A) (by C-G formula based on SCr of 0.44 mg/dL (L)). Liver Function Tests: Recent Labs  Lab  09/13/20 1045 09/14/20 0525  AST 40 30  ALT 42 32  ALKPHOS 156* 130*  BILITOT 2.3* 2.0*  PROT 6.7 6.0*  ALBUMIN 2.2* 2.0*   No results for input(s): LIPASE, AMYLASE in the last 168 hours. Recent Labs  Lab 09/13/20 1520  AMMONIA 45*   Coagulation Profile: Recent Labs  Lab 09/13/20 1045 09/14/20 0525  INR 1.4* 1.5*   Cardiac Enzymes: No results for input(s): CKTOTAL, CKMB, CKMBINDEX, TROPONINI in the last 168 hours. BNP (last 3 results) No results for input(s): PROBNP in the last 8760 hours. HbA1C: No results for input(s): HGBA1C in the last 72 hours. CBG: No results for input(s): GLUCAP in the last 168 hours. Lipid Profile: No results for input(s): CHOL, HDL, LDLCALC, TRIG, CHOLHDL, LDLDIRECT in the last 72 hours. Thyroid Function Tests: No results for input(s): TSH, T4TOTAL, FREET4, T3FREE, THYROIDAB in the last 72 hours. Anemia Panel: Recent Labs    09/13/20 1450  TIBC 175*  IRON 44*   Sepsis Labs: Recent Labs  Lab 09/13/20 1030 09/13/20 1340 09/14/20 0525  PROCALCITON  --   --  3.00  LATICACIDVEN 3.3* 2.5*  --     Recent Results (from the past 240 hour(s))  Culture, blood (routine x 2)     Status: None (Preliminary result)   Collection Time: 09/13/20 10:45 AM   Specimen: BLOOD  Result Value Ref Range Status   Specimen Description   Final    BLOOD RIGHT ANTECUBITAL Performed at [facility], [place] [address] [place], [place] [address]    Special Requests   Final    BOTTLES DRAWN AEROBIC AND ANAEROBIC Blood Culture adequate volume Performed at  [place] [address] [place], [place] [address]    Culture  Setup Time   Final    IN BOTH AEROBIC AND ANAEROBIC BOTTLES GRAM POSITIVE COCCI Organism ID to follow CRITICAL RESULT CALLED TO, READ BACK BY AND VERIFIED WITH: E JACKSON PHARMD 09/13/20 0347 JDW    Culture   Final    NO GROWTH < 24 HOURS Performed at [facility], [place] [address], [place], [place] [address]    Report Status PENDING  Incomplete  Blood Culture ID Panel (Reflexed)     Status: Abnormal   Collection Time: 09/13/20 10:45 AM  Result Value Ref Range Status   Enterococcus faecalis NOT DETECTED NOT DETECTED Final   Enterococcus Faecium NOT DETECTED NOT DETECTED Final   Listeria monocytogenes NOT DETECTED NOT DETECTED Final   Staphylococcus species DETECTED (A) NOT DETECTED Final    Comment: CRITICAL RESULT CALLED TO, READ BACK BY AND VERIFIED WITH: E JACKSON PHARMD 09/13/20 0347 JDW    Staphylococcus aureus (BCID) DETECTED (A) NOT DETECTED Final    Comment: CRITICAL RESULT CALLED TO, READ BACK BY AND VERIFIED WITH: E JACKSON PHARMD 09/13/20 0347 JDW    Staphylococcus epidermidis NOT DETECTED NOT DETECTED Final   Staphylococcus lugdunensis NOT DETECTED NOT DETECTED Final   Streptococcus species NOT DETECTED NOT DETECTED Final   Streptococcus agalactiae NOT DETECTED NOT DETECTED Final   Streptococcus pneumoniae NOT DETECTED NOT DETECTED Final   Streptococcus pyogenes NOT DETECTED NOT DETECTED Final   A.calcoaceticus-baumannii NOT DETECTED NOT DETECTED Final   Bacteroides fragilis NOT DETECTED NOT DETECTED Final   Enterobacterales NOT DETECTED NOT DETECTED Final   Enterobacter cloacae complex NOT DETECTED NOT DETECTED Final   Escherichia coli NOT DETECTED NOT DETECTED Final   Klebsiella aerogenes NOT DETECTED NOT DETECTED Final   Klebsiella  oxytoca NOT DETECTED NOT DETECTED Final   Klebsiella pneumoniae NOT DETECTED NOT DETECTED Final   Proteus species NOT DETECTED NOT DETECTED Final    Salmonella species NOT DETECTED NOT DETECTED Final   Serratia marcescens NOT DETECTED NOT DETECTED Final   Haemophilus influenzae NOT DETECTED NOT DETECTED Final   Neisseria meningitidis NOT DETECTED NOT DETECTED Final   Pseudomonas aeruginosa NOT DETECTED NOT DETECTED Final   Stenotrophomonas maltophilia NOT DETECTED NOT DETECTED Final   Candida albicans NOT DETECTED NOT DETECTED Final   Candida auris NOT DETECTED NOT DETECTED Final   Candida glabrata NOT DETECTED NOT DETECTED Final   Candida krusei NOT DETECTED NOT DETECTED Final   Candida parapsilosis NOT DETECTED NOT DETECTED Final   Candida tropicalis NOT DETECTED NOT DETECTED Final   Cryptococcus neoformans/gattii NOT DETECTED NOT DETECTED Final   Meth resistant mecA/C and MREJ NOT DETECTED NOT DETECTED Final    Comment: Performed at [facility], [place] [address], [place], [place] [address]  Culture, blood (routine x 2)     Status: None (Preliminary result)   Collection Time: 09/13/20 11:20 AM   Specimen: BLOOD  Result Value Ref Range Status   Specimen Description   Final    BLOOD RIGHT ANTECUBITAL Performed at [place] [address], [place], [place] [address]    Special Requests   Final    BOTTLES DRAWN AEROBIC AND ANAEROBIC Blood Culture adequate volume Performed at [place] [address] [place], [place] [address]    Culture  Setup Time   Final    IN BOTH AEROBIC AND ANAEROBIC BOTTLES GRAM POSITIVE COCCI CRITICAL VALUE NOTED.  VALUE IS CONSISTENT WITH PREVIOUSLY REPORTED AND CALLED VALUE.    Culture   Final    NO GROWTH < 24 HOURS Performed at [facility], [place] [address] [place], [place] [address]    Report Status PENDING  Incomplete  Resp Panel by RT-PCR (Flu A&B, Covid) Nasopharyngeal Swab     Status: None   Collection Time: 09/13/20  2:01 PM   Specimen: Nasopharyngeal Swab; Nasopharyngeal(NP) swabs in vial transport medium  Result Value Ref Range  Status   SARS Coronavirus 2 by RT PCR NEGATIVE NEGATIVE Final    Comment: (NOTE) SARS-CoV-2 target nucleic acids are NOT DETECTED.  The SARS-CoV-2 RNA is generally detectable in upper respiratory specimens during the acute phase of infection. The lowest concentration of SARS-CoV-2 viral copies this assay can detect is 138 copies/mL. A negative result does not preclude SARS-Cov-2 infection and should not be used as the sole basis for treatment or other patient management decisions. A negative result may occur with  improper specimen collection/handling, submission of specimen other than nasopharyngeal swab, presence of viral mutation(s) within the areas targeted by this assay, and inadequate number of viral copies(<138 copies/mL). A negative result must be combined with clinical observations, patient history, and epidemiological information. The expected result is Negative.  Fact Sheet for Patients:  EntrepreneurPulse.com.au  Fact Sheet for Healthcare Providers:  IncredibleEmployment.be  This test is no t yet approved or cleared by the [place] FDA and  has been authorized for detection and/or diagnosis of SARS-CoV-2 by FDA under an Emergency Use Authorization (EUA). This EUA will remain  in effect (meaning this test can be used) for the duration of the COVID-19 declaration under Section 564(b)(1) of the Act, 21 U.S.C.section 360bbb-3(b)(1), unless the authorization is terminated  or revoked sooner.       Influenza  A by PCR NEGATIVE NEGATIVE Final   Influenza B by PCR NEGATIVE NEGATIVE Final    Comment: (NOTE) The Xpert Xpress SARS-CoV-2/FLU/RSV plus assay is intended as an aid in the diagnosis of influenza from Nasopharyngeal swab specimens and should not be used as a sole basis for treatment. Nasal washings and aspirates are unacceptable for Xpert Xpress SARS-CoV-2/FLU/RSV testing.  Fact Sheet for  Patients: EntrepreneurPulse.com.au  Fact Sheet for Healthcare Providers: IncredibleEmployment.be  This test is not yet approved or cleared by the [place] FDA and has been authorized for detection and/or diagnosis of SARS-CoV-2 by FDA under an Emergency Use Authorization (EUA). This EUA will remain in effect (meaning this test can be used) for the duration of the COVID-19 declaration under Section 564(b)(1) of the Act, 21 U.S.C. section 360bbb-3(b)(1), unless the authorization is terminated or revoked.  Performed at [facility], [place] [address], [place], [place] [address]      RN Pressure Injury Documentation:     Estimated body mass index is 24.97 kg/m as calculated from the following:   Height as of this encounter: _0  (1.778 m).   Weight as of this encounter: 78.9 kg.  Malnutrition Type:  Nutrition Problem: Increased nutrient needs Etiology: chronic illness  Malnutrition Characteristics:  Signs/Symptoms: estimated needs  Nutrition Interventions:  Interventions: Ensure Enlive (each supplement provides 350kcal and 20 grams of protein),MVI   Radiology Studies: DG Chest 1 View  Result Date: 09/13/2020 CLINICAL DATA:  Leg swelling EXAM: CHEST  1 VIEW COMPARISON:  08/25/2020. FINDINGS: Mild hypoinflation. No pneumothorax, pleural effusion or focal consolidation. Cardiomediastinal silhouette within normal limits. No acute osseous abnormality. IMPRESSION: No focal airspace disease. Electronically Signed   By: Primitivo Gauze M.D.   On: 09/13/2020 10:59   CT ABDOMEN PELVIS W CONTRAST  Result Date: 09/13/2020 CLINICAL DATA:  Hepatic cirrhosis. EXAM: CT ABDOMEN AND PELVIS WITH CONTRAST TECHNIQUE: Multidetector CT imaging of the abdomen and pelvis was performed using the standard protocol following bolus administration of intravenous contrast. CONTRAST:  135m OMNIPAQUE IOHEXOL 300 MG/ML  SOLN COMPARISON:  None.  FINDINGS: Lower chest: No acute abnormality. Hepatobiliary: No gallstones or biliary dilatation is noted. Mildly nodular hepatic contours are noted suggesting hepatic cirrhosis. No definite focal hepatic abnormality is noted. Pancreas: Unremarkable. No pancreatic ductal dilatation or surrounding inflammatory changes. Spleen: Normal in size without focal abnormality. Adrenals/Urinary Tract: Adrenal glands are unremarkable. Kidneys are normal, without renal calculi, focal lesion, or hydronephrosis. Bladder is unremarkable. Stomach/Bowel: Stomach is within normal limits. Appendix appears normal. No evidence of bowel wall thickening, distention, or inflammatory changes. Vascular/Lymphatic: No significant vascular findings are present. No enlarged abdominal or pelvic lymph nodes. Reproductive: Prostate is unremarkable. Other: There is the interval development of fluid collections within the musculature of the left ileo psoas muscle as well as the musculature of the visualized portion of the left gluteal region and proximal left thigh. Diffuse swelling is noted, and these findings are most consistent with innumerable abscesses that have formed within the musculature. Mild subcutaneous edema or inflammation is seen involving the visualized portions of the left upper thigh and hip region. Musculoskeletal: There is significant destruction of the left femoral head which may be related to chronic septic arthritis or inflammation. Left hip effusion is noted. IMPRESSION: 1. Findings consistent with hepatic cirrhosis. 2. Interval development of innumerable fluid collections are seen within the musculature of the left ileopsoas muscle as well as the musculature of the visualized portion of the left gluteal region and proximal  left thigh consistent with innumerable abscesses that have formed within the musculature, several of which are quite large. Diffuse swelling is noted, and there is noted significant destruction of the left  femoral head which may be related to chronic septic arthritis or inflammation. Left hip effusion is noted. 3. Mild subcutaneous edema or inflammation is seen involving the visualized portions of the left upper thigh and hip region. Electronically Signed   By: Marijo Conception M.D.   On: 09/13/2020 12:23   CT IMAGE GUIDED FLUID DRAIN BY CATHETER  Result Date: 09/14/2020 INDICATION: Septic arthritis affecting left hip now with complex retroperitoneal and left thigh collections. Please perform image guided drainage catheter placement for infection source control purposes prior to definitive operative debridement EXAM: ULTRASOUND AND CT-GUIDED PERCUTANEOUS DRAINAGE CATHETER PLACEMENT X3 COMPARISON:  CT abdomen pelvis-09/13/2020 MEDICATIONS: The patient is currently admitted to the hospital and receiving intravenous antibiotics. The antibiotics were administered within an appropriate time frame prior to the initiation of the procedure. ANESTHESIA/SEDATION: Moderate (conscious) sedation was employed during this procedure. A total of Versed 6 mg and Fentanyl 200 mcg was administered intravenously. Moderate Sedation Time: 50 minutes. The patient's level of consciousness and vital signs were monitored continuously by radiology nursing throughout the procedure under my direct supervision. CONTRAST:  None COMPLICATIONS: None immediate. PROCEDURE: Informed written consent was obtained from the patient after a discussion of the risks, benefits and alternatives to treatment. The patient was placed supine on the CT gantry and a pre procedural CT was performed re-demonstrating the known abscess/fluid collection within the left retroperitoneal space as well as ill-defined fluid collections involving the medial and lateral aspects of thigh. The collections were identified sonographically and the procedure was planned. A timeout was performed prior to the initiation of the procedure. The skin overlying the anterior aspect of the  left groin and thigh were prepped and draped in the usual sterile fashion. The overlying soft tissues were anesthetized with 1% lidocaine with epinephrine. Under direct ultrasound guidance, the retroperitoneal collection was accessed at the level of the left groin directed in a caudal to cranial trajectory with an 18 gauge trocar needle. Short Amplatz wire was coiled within collection. Next, both the medial and lateral collections within the anterior thigh were also accessed with 18 gauge trocar needles, also in a caudal to cranial trajectory with short Amplatz wires were coiled within both collections. Multiple ultrasound images were saved for procedural documentation purposes. Appropriate position was confirmed with CT imaging Next, tracks were dilated allowing placement of a 14 French drainage catheter within the retroperitoneal collection and 12 French drainage catheters within the medial and lateral anterior thigh collections. Appropriate position was confirmed with CT imaging. After drainage catheter placement, approximately 1.3 L of purulent fluid was aspirated from all collections. A small sample of aspirated fluid was capped and sent to the laboratory for analysis. Postprocedural CT imaging was performed demonstrating significant reduction in size of complex fluid collections (series 6). The drainage catheters were connected to gravity bags and sutured in place. Dressings were applied. The patient tolerated the procedure well without immediate post procedural complication. IMPRESSION: Successful ultrasound and CT guided placement of a 53 French all purpose drain catheter into the retroperitoneal abscess with two 12 [place] all-purpose drainage catheter was placed into the medial and lateral anterior left thigh abscesses. A total of 1.3 L of purulent fluid was aspirated from all collections. A representative sample of aspirated fluid was sent to the laboratory as requested by the ordering  clinical team.  Electronically Signed   By: Sandi Mariscal M.D.   On: 09/14/2020 13:56   CT IMAGE GUIDED FLUID DRAIN BY CATHETER  Result Date: 09/14/2020 INDICATION: Septic arthritis affecting left hip now with complex retroperitoneal and left thigh collections. Please perform image guided drainage catheter placement for infection source control purposes prior to definitive operative debridement EXAM: ULTRASOUND AND CT-GUIDED PERCUTANEOUS DRAINAGE CATHETER PLACEMENT X3 COMPARISON:  CT abdomen pelvis-09/13/2020 MEDICATIONS: The patient is currently admitted to the hospital and receiving intravenous antibiotics. The antibiotics were administered within an appropriate time frame prior to the initiation of the procedure. ANESTHESIA/SEDATION: Moderate (conscious) sedation was employed during this procedure. A total of Versed 6 mg and Fentanyl 200 mcg was administered intravenously. Moderate Sedation Time: 50 minutes. The patient's level of consciousness and vital signs were monitored continuously by radiology nursing throughout the procedure under my direct supervision. CONTRAST:  None COMPLICATIONS: None immediate. PROCEDURE: Informed written consent was obtained from the patient after a discussion of the risks, benefits and alternatives to treatment. The patient was placed supine on the CT gantry and a pre procedural CT was performed re-demonstrating the known abscess/fluid collection within the left retroperitoneal space as well as ill-defined fluid collections involving the medial and lateral aspects of thigh. The collections were identified sonographically and the procedure was planned. A timeout was performed prior to the initiation of the procedure. The skin overlying the anterior aspect of the left groin and thigh were prepped and draped in the usual sterile fashion. The overlying soft tissues were anesthetized with 1% lidocaine with epinephrine. Under direct ultrasound guidance, the retroperitoneal collection was accessed at  the level of the left groin directed in a caudal to cranial trajectory with an 18 gauge trocar needle. Short Amplatz wire was coiled within collection. Next, both the medial and lateral collections within the anterior thigh were also accessed with 18 gauge trocar needles, also in a caudal to cranial trajectory with short Amplatz wires were coiled within both collections. Multiple ultrasound images were saved for procedural documentation purposes. Appropriate position was confirmed with CT imaging Next, tracks were dilated allowing placement of a 14 French drainage catheter within the retroperitoneal collection and 12 French drainage catheters within the medial and lateral anterior thigh collections. Appropriate position was confirmed with CT imaging. After drainage catheter placement, approximately 1.3 L of purulent fluid was aspirated from all collections. A small sample of aspirated fluid was capped and sent to the laboratory for analysis. Postprocedural CT imaging was performed demonstrating significant reduction in size of complex fluid collections (series 6). The drainage catheters were connected to gravity bags and sutured in place. Dressings were applied. The patient tolerated the procedure well without immediate post procedural complication. IMPRESSION: Successful ultrasound and CT guided placement of a 70 French all purpose drain catheter into the retroperitoneal abscess with two 12 [place] all-purpose drainage catheter was placed into the medial and lateral anterior left thigh abscesses. A total of 1.3 L of purulent fluid was aspirated from all collections. A representative sample of aspirated fluid was sent to the laboratory as requested by the ordering clinical team. Electronically Signed   By: Sandi Mariscal M.D.   On: 09/14/2020 13:56   CT IMAGE GUIDED FLUID DRAIN BY CATHETER  Result Date: 09/14/2020 INDICATION: Septic arthritis affecting left hip now with complex retroperitoneal and left thigh  collections. Please perform image guided drainage catheter placement for infection source control purposes prior to definitive operative debridement EXAM: ULTRASOUND AND CT-GUIDED PERCUTANEOUS DRAINAGE  CATHETER PLACEMENT X3 COMPARISON:  CT abdomen pelvis-09/13/2020 MEDICATIONS: The patient is currently admitted to the hospital and receiving intravenous antibiotics. The antibiotics were administered within an appropriate time frame prior to the initiation of the procedure. ANESTHESIA/SEDATION: Moderate (conscious) sedation was employed during this procedure. A total of Versed 6 mg and Fentanyl 200 mcg was administered intravenously. Moderate Sedation Time: 50 minutes. The patient's level of consciousness and vital signs were monitored continuously by radiology nursing throughout the procedure under my direct supervision. CONTRAST:  None COMPLICATIONS: None immediate. PROCEDURE: Informed written consent was obtained from the patient after a discussion of the risks, benefits and alternatives to treatment. The patient was placed supine on the CT gantry and a pre procedural CT was performed re-demonstrating the known abscess/fluid collection within the left retroperitoneal space as well as ill-defined fluid collections involving the medial and lateral aspects of thigh. The collections were identified sonographically and the procedure was planned. A timeout was performed prior to the initiation of the procedure. The skin overlying the anterior aspect of the left groin and thigh were prepped and draped in the usual sterile fashion. The overlying soft tissues were anesthetized with 1% lidocaine with epinephrine. Under direct ultrasound guidance, the retroperitoneal collection was accessed at the level of the left groin directed in a caudal to cranial trajectory with an 18 gauge trocar needle. Short Amplatz wire was coiled within collection. Next, both the medial and lateral collections within the anterior thigh were also  accessed with 18 gauge trocar needles, also in a caudal to cranial trajectory with short Amplatz wires were coiled within both collections. Multiple ultrasound images were saved for procedural documentation purposes. Appropriate position was confirmed with CT imaging Next, tracks were dilated allowing placement of a 14 French drainage catheter within the retroperitoneal collection and 12 French drainage catheters within the medial and lateral anterior thigh collections. Appropriate position was confirmed with CT imaging. After drainage catheter placement, approximately 1.3 L of purulent fluid was aspirated from all collections. A small sample of aspirated fluid was capped and sent to the laboratory for analysis. Postprocedural CT imaging was performed demonstrating significant reduction in size of complex fluid collections (series 6). The drainage catheters were connected to gravity bags and sutured in place. Dressings were applied. The patient tolerated the procedure well without immediate post procedural complication. IMPRESSION: Successful ultrasound and CT guided placement of a 27 French all purpose drain catheter into the retroperitoneal abscess with two 12 [place] all-purpose drainage catheter was placed into the medial and lateral anterior left thigh abscesses. A total of 1.3 L of purulent fluid was aspirated from all collections. A representative sample of aspirated fluid was sent to the laboratory as requested by the ordering clinical team. Electronically Signed   By: Sandi Mariscal M.D.   On: 09/14/2020 13:56   Scheduled Meds: . carvedilol  3.125 mg Oral BID  . diphenhydrAMINE      . docusate sodium  100 mg Oral BID  . feeding supplement  237 mL Oral BID BM  . fentaNYL      . folic acid  1 mg Oral Daily  . lactulose  10 g Oral BID  . magnesium oxide  400 mg Oral Daily  . midazolam      . multivitamin with minerals  1 tablet Oral Daily  . pantoprazole  40 mg Oral BID  . sertraline  25 mg Oral  Daily  . sodium chloride flush  5 mL Intracatheter Q8H  . thiamine  100 mg Oral Daily   Continuous Infusions: .  ceFAZolin (ANCEF) IV 2 g (09/14/20 0542)    LOS: 1 day   Kerney Elbe, DO [facility] PAGER is on [place]  If 7PM-7AM, please contact night-coverage [email or web address]

## 2020-09-14 NOTE — Consult Note (Addendum)
Chief Complaint: Patient was seen in consultation today for CT-guided aspiration/ possible drainage of left iliopsoas/left fluid collections Chief Complaint  Patient presents with  . Abnormal Lab    Referring Physician(s): Landau,J  Supervising Physician: Simonne Come  Patient Status: [facility] - In-pt  History of Present Illness: [name] is a 35 y.o. male with past medical history of alcoholic cirrhosis, asthma, hypertension, depression who was admitted to [facility] on 2/22 with worsening left hip pain/left leg swelling.  He had imaging in July 2021 which did not demonstrate any significant bony abnormality.  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  Patient was tentatively scheduled to undergo left hip replacement in early March at outside facility.  He has since then had several medical complications including thrombocytopenia, anemia, liver failure, COVID-19, progressive inability to ambulate as well as acute renal failure.  Patient now has difficulty placing weight on the left lower extremity.  CT abdomen pelvis performed yesterday revealed:   1. Findings consistent with hepatic cirrhosis. 2. Interval development of innumerable fluid collections are seen within the musculature of the left ileopsoas muscle as well as the musculature of the visualized portion of the left gluteal region and proximal left thigh consistent with innumerable abscesses that have formed within the musculature, several of which are quite large. Diffuse swelling is noted, and there is noted significant destruction of the left femoral head which may be related to chronic septic arthritis or inflammation. Left hip effusion is noted. 3. Mild subcutaneous edema or inflammation is seen involving the visualized portions of the left upper thigh and hip region  Patient is currently afebrile, blood  cultures growing staph aureus, WBC 8.3, hemoglobin 7.6, creatinine 0.44, total bilirubin 2, PT 18, INR 1.5, latest COVID-19 negative, lactic acid 2.5,, ammonia 45 yesterday.  Request now received from orthopedic surgery for image guided drainage of left iliopsoas fluid collection as well as aspiration/ possible drainage of left thigh fluid collections.  Past Medical History:  Diagnosis Date  . Alcohol abuse   . Asthma   . Depression   . Hypertension     Past Surgical History:  Procedure Laterality Date  . FRACTURE SURGERY      Allergies: Cashew nut (anacardium occidentale) skin test and Cashew nut oil  Medications: Prior to Admission medications   Medication Sig Start Date End Date Taking? Authorizing Provider  carvedilol (COREG) 3.125 MG tablet Take 3.125 mg by mouth 2 (two) times daily. 07/28/20  Yes [provider]  folic acid (FOLVITE) 1 MG tablet Take 1 tablet (1 mg total) by mouth daily. 08/30/20  Yes Pokhrel, Laxman, MD  guaiFENesin-dextromethorphan (ROBITUSSIN DM) 100-10 MG/5ML syrup Take 5 mLs by mouth every 4 (four) hours as needed for cough (chest congestion). Patient taking differently: Take 10 mLs by mouth every 4 (four) hours as needed for cough (chest congestion). 08/29/20  Yes Pokhrel, Laxman, MD  lactulose (CHRONULAC) 10 GM/15ML solution Take 15 mLs (10 g total) by mouth 2 (two) times daily. 08/29/20  Yes Pokhrel, Laxman, MD  magnesium oxide (MAG-OX) 400 MG tablet Take 1 tablet by mouth daily. 07/28/20  Yes [provider]  pantoprazole (PROTONIX) 40 MG tablet Take 1 tablet (40 mg total) by mouth 2 (two) times daily before a meal for 28 days, THEN 1 tablet (40 mg total) daily for 28 days. 08/29/20 10/24/20 Yes Pokhrel, Rebekah Chesterfield, MD  prednisoLONE (ORAPRED) 15  MG/5ML solution Take 1.6-13.3 mLs by mouth See admin instructions. Take 13.87ml by mouth daily for 28 days, 10ml for 4 days, 6.31ml daily for 4 days, 3.3 daily for 4 days, then 1.87ml daily for 3 days. 08/29/20  Yes  [provider]  sertraline (ZOLOFT) 25 MG tablet Take 25 mg by mouth at bedtime. 08/20/20  Yes [provider]  thiamine 100 MG tablet Take 1 tablet (100 mg total) by mouth daily. 08/30/20  Yes Pokhrel, Laxman, MD  buPROPion (WELLBUTRIN XL) 300 MG 24 hr tablet Take 1 tablet (300 mg total) by mouth daily. Patient not taking: No sig reported 10/16/13   Rachael Fee, MD  carbamazepine (TEGRETOL XR) 200 MG 12 hr tablet Take 1 tablet (200 mg total) by mouth at bedtime. Patient not taking: No sig reported 10/16/13   Rachael Fee, MD  hydrOXYzine (ATARAX/VISTARIL) 25 MG tablet Take 1 tablet (25 mg total) by mouth every 6 (six) hours as needed for anxiety (or CIWA score </= 10). Patient not taking: No sig reported 10/16/13   Rachael Fee, MD  lisinopril-hydrochlorothiazide (ZESTORETIC) 20-25 MG tablet Take 1 tablet by mouth daily. Patient not taking: No sig reported 08/31/20   Pokhrel, Rebekah Chesterfield, MD  oxyCODONE (ROXICODONE) 5 MG immediate release tablet Take 1 tablet (5 mg total) by mouth every 8 (eight) hours as needed. Patient not taking: No sig reported 08/29/20 08/29/21  Pokhrel, Rebekah Chesterfield, MD  prednisoLONE 5 MG TABS tablet Take 8 tablets (40 mg total) by mouth daily for 28 days, THEN 6 tablets (30 mg total) daily for 4 days, THEN 4 tablets (20 mg total) daily for 4 days, THEN 2 tablets (10 mg total) daily for 4 days, THEN 1 tablet (5 mg total) every 3 (three) days for 1 day. Patient not taking: Reported on 09/13/2020 08/29/20 10/09/20  Joycelyn Das, MD  traMADol (ULTRAM) 50 MG tablet Take 1 tablet (50 mg total) by mouth every 6 (six) hours as needed. Patient not taking: Reported on 09/13/2020 08/29/20   Joycelyn Das, MD     Family History  Problem Relation Age of Onset  . Cirrhosis Neg Hx   . Diabetes Mellitus II Neg Hx     Social History   Socioeconomic History  . Marital status: Single    Spouse name: Not on file  . Number of children: Not on file  . Years of education: Not on file   . Highest education level: Not on file  Occupational History  . Not on file  Tobacco Use  . Smoking status: Current Some Day Smoker    Types: Cigars  . Smokeless tobacco: Never Used  Substance and Sexual Activity  . Alcohol use: Yes    Alcohol/week: 12.0 - 18.0 standard drinks    Types: 12 - 18 Cans of beer per week    Comment: heavy drinker  . Drug use: Yes    Types: Marijuana  . Sexual activity: Yes    Birth control/protection: None  Other Topics Concern  . Not on file  Social History Narrative  . Not on file   Social Determinants of Health   Financial Resource Strain: Not on file  Food Insecurity: Not on file  Transportation Needs: Not on file  Physical Activity: Not on file  Stress: Not on file  Social Connections: Not on file      Review of Systems currently denies fever, headache, chest pain, dyspnea, cough, nausea, vomiting or bleeding.  He does have left lateral lower abdominal/flank/left  leg discomfort and left leg edema, fatigue.  Vital Signs: BP 122/82   Pulse (!) 108   Temp 98.5 F (36.9 C) (Oral)   Resp 17   Ht 5\' 10"  (1.778 m)   Wt 174 lb (78.9 kg)   SpO2 98%   BMI 24.97 kg/m   Physical Exam awake, answering questions okay.  Slightly jaundiced.  Pale complexion.  Chest clear to auscultation bilaterally.  Heart with slightly tachycardic but regular rhythm.  Abdomen soft, positive bowel sounds, some mild tenderness left lower quadrant; patient with extensive left lower extremity edema from groin to foot region; trace pretibial edema on the right.  Some erythema noted in left groin/ upper thigh with some skin desquamation, tender to palpation; previous blisters noted in area yesterday  Imaging: DG Chest 1 View  Result Date: 09/13/2020 CLINICAL DATA:  Leg swelling EXAM: CHEST  1 VIEW COMPARISON:  08/25/2020. FINDINGS: Mild hypoinflation. No pneumothorax, pleural effusion or focal consolidation. Cardiomediastinal silhouette within normal limits. No acute  osseous abnormality. IMPRESSION: No focal airspace disease. Electronically Signed   By: 10/23/2020 M.D.   On: 09/13/2020 10:59   DG Chest 2 View  Result Date: 08/22/2020 CLINICAL DATA:  Elevated white blood cell count EXAM: CHEST - 2 VIEW COMPARISON:  None. FINDINGS: Cardiac shadow is mildly enlarged but accentuated by the frontal technique. The lungs are well aerated bilaterally. No focal infiltrate or effusion is seen. No bony abnormality is noted. IMPRESSION: No acute abnormality noted. Electronically Signed   By: 08/24/2020 M.D.   On: 08/22/2020 19:35   CT ABDOMEN PELVIS W CONTRAST  Result Date: 09/13/2020 CLINICAL DATA:  Hepatic cirrhosis. EXAM: CT ABDOMEN AND PELVIS WITH CONTRAST TECHNIQUE: Multidetector CT imaging of the abdomen and pelvis was performed using the standard protocol following bolus administration of intravenous contrast. CONTRAST:  09/15/2020 OMNIPAQUE IOHEXOL 300 MG/ML  SOLN COMPARISON:  None. FINDINGS: Lower chest: No acute abnormality. Hepatobiliary: No gallstones or biliary dilatation is noted. Mildly nodular hepatic contours are noted suggesting hepatic cirrhosis. No definite focal hepatic abnormality is noted. Pancreas: Unremarkable. No pancreatic ductal dilatation or surrounding inflammatory changes. Spleen: Normal in size without focal abnormality. Adrenals/Urinary Tract: Adrenal glands are unremarkable. Kidneys are normal, without renal calculi, focal lesion, or hydronephrosis. Bladder is unremarkable. Stomach/Bowel: Stomach is within normal limits. Appendix appears normal. No evidence of bowel wall thickening, distention, or inflammatory changes. Vascular/Lymphatic: No significant vascular findings are present. No enlarged abdominal or pelvic lymph nodes. Reproductive: Prostate is unremarkable. Other: There is the interval development of fluid collections within the musculature of the left ileo psoas muscle as well as the musculature of the visualized portion of the left  gluteal region and proximal left thigh. Diffuse swelling is noted, and these findings are most consistent with innumerable abscesses that have formed within the musculature. Mild subcutaneous edema or inflammation is seen involving the visualized portions of the left upper thigh and hip region. Musculoskeletal: There is significant destruction of the left femoral head which may be related to chronic septic arthritis or inflammation. Left hip effusion is noted. IMPRESSION: 1. Findings consistent with hepatic cirrhosis. 2. Interval development of innumerable fluid collections are seen within the musculature of the left ileopsoas muscle as well as the musculature of the visualized portion of the left gluteal region and proximal left thigh consistent with innumerable abscesses that have formed within the musculature, several of which are quite large. Diffuse swelling is noted, and there is noted significant destruction of the left femoral  head which may be related to chronic septic arthritis or inflammation. Left hip effusion is noted. 3. Mild subcutaneous edema or inflammation is seen involving the visualized portions of the left upper thigh and hip region. Electronically Signed   By: Lupita RaiderJames  Green Jr M.D.   On: 09/13/2020 12:23   US RENAL  Result Date: 08/23/2020 CLINICAL DATA:  Acute renal injury EXAM: RENAL / URINARY TRACT ULTRASOUND COMPLETE COMPARISON:  None. FINDINGS: Right Kidney: Renal measurements: 13.1 x 5.1 x 6.0 cm. = volume: 208 mL. Echogenicity within normal limits. No mass or hydronephrosis visualized. Left Kidney: Renal measurements: 14.4 x 5.7 x 6.3 cm. = volume: 272 mL. Echogenicity within normal limits. No mass or hydronephrosis visualized. Bladder: Appears normal for degree of bladder distention. Other: Increased echogenicity in the liver is noted consistent with fatty infiltration. Spleen is mildly prominent but maintains its spleniform shape. IMPRESSION: No renal abnormality is noted. Changes of  fatty liver and mild splenomegaly. Electronically Signed   By: Alcide CleverMark  Lukens M.D.   On: 08/23/2020 00:13   US Abdomen Limited  Result Date: 08/22/2020 CLINICAL DATA:  Jaundice.  Concern for liver cirrhosis. EXAM: ULTRASOUND ABDOMEN LIMITED RIGHT UPPER QUADRANT COMPARISON:  None. FINDINGS: Gallbladder: Physiologically distended. No gallstones or wall thickening visualized. No sonographic Murphy sign noted by sonographer. Common bile duct: Diameter: 3 mm, not well-defined. Liver: Diffusely increased parenchymal echogenicity, liver parenchyma is difficult to penetrate. No discrete focal hepatic lesion. No definite capsular nodularity. Portal vein is patent on color Doppler imaging with normal direction of blood flow towards the liver. Other: No right upper quadrant ascites. There are bilateral pleural effusions. Technically challenging exam due to habitus and patient's limited ability to hold breath. IMPRESSION: 1. Diffusely increased hepatic parenchymal echogenicity consistent with steatosis or other intrinsic hepatocellular disease. No capsular nodularity or other findings of cirrhosis. 2. Normal sonographic appearance of the gallbladder and biliary tree. 3. Pleural effusions are noted. There is no right upper quadrant ascites. Electronically Signed   By: Narda RutherfordMelanie  Sanford M.D.   On: 08/22/2020 18:54   DG CHEST PORT 1 VIEW  Result Date: 08/25/2020 CLINICAL DATA:  Hypoxia EXAM: PORTABLE CHEST 1 VIEW COMPARISON:  August 22, 2020 FINDINGS: The cardiomediastinal silhouette is unchanged and enlarged in contour.Low lung volumes. No pleural effusion. No pneumothorax. Diffuse interstitial prominence and peribronchial cuffing. Visualized abdomen is unremarkable. No acute osseous abnormality. IMPRESSION: Constellation of findings may reflect underlying pulmonary edema. Atypical infection could present similarly. Electronically Signed   By: Meda KlinefelterStephanie  Peacock MD   On: 08/25/2020 14:52   ECHOCARDIOGRAM LIMITED  Result  Date: 08/25/2020    ECHOCARDIOGRAM LIMITED REPORT   Patient Name:   Macario GoldsMATTHEW V Whiting Date of Exam: 08/25/2020 Medical Rec #:  [id number]         Height:       70.0 in Accession #:    [id number]        Weight:       209.7 lb Date of Birth:  1985/12/13         BSA:          2.130 m Patient Age:    34 years          BP:           146/87 mmHg Patient Gender: M                 HR:           98 bpm. Exam Location:  Inpatient Procedure: Limited  Echo, Cardiac Doppler, Color Doppler and Intracardiac            Opacification Agent Indications:    Dyspnea R06.00  History:        Patient has no prior history of Echocardiogram examinations.                 Risk Factors:Hypertension and Current Smoker.  Sonographer:    Renella Cunas RDCS Referring Phys: [id number] [facility] POKHREL  Sonographer Comments: Covid positive. IMPRESSIONS  1. DIfficult acoustic window even with Definity . Left ventricular ejection fraction, by estimation, is 55 to 60%. The left ventricle has normal function. The left ventricular internal cavity size was moderately dilated. Left ventricular diastolic parameters are indeterminate.  2. Right ventricular systolic function is normal. The right ventricular size is normal.  3. The mitral valve is abnormal. Trivial mitral valve regurgitation.  4. The aortic valve is normal in structure. Aortic valve regurgitation is not visualized. FINDINGS  Left Ventricle: DIfficult acoustic window even with Definity. Left ventricular ejection fraction, by estimation, is 55 to 60%. The left ventricle has normal function. Definity contrast agent was given IV to delineate the left ventricular endocardial borders. The left ventricular internal cavity size was moderately dilated. There is no left ventricular hypertrophy. Left ventricular diastolic parameters are indeterminate. Right Ventricle: The right ventricular size is normal. Right vetricular wall thickness was not assessed. Right ventricular systolic function is normal. Left Atrium:  Left atrial size was normal in size. Right Atrium: Right atrial size was normal in size. Mitral Valve: The mitral valve is abnormal. There is mild thickening of the mitral valve leaflet(s). Trivial mitral valve regurgitation. Tricuspid Valve: The tricuspid valve is normal in structure. Tricuspid valve regurgitation is trivial. Aortic Valve: The aortic valve is normal in structure. Aortic valve regurgitation is not visualized. Pulmonic Valve: The pulmonic valve was normal in structure. Pulmonic valve regurgitation is not visualized. LEFT VENTRICLE PLAX 2D LVIDd:         5.70 cm LVIDs:         3.90 cm LV PW:         0.80 cm LV IVS:        0.80 cm LVOT diam:     2.40 cm LV SV:         82 LV SV Index:   39 LVOT Area:     4.52 cm  LEFT ATRIUM         Index LA diam:    4.10 cm 1.93 cm/m  AORTIC VALVE LVOT Vmax:   104.00 cm/s LVOT Vmean:  72.000 cm/s LVOT VTI:    0.182 m  AORTA Ao Root diam: 3.50 cm  SHUNTS Systemic VTI:  0.18 m Systemic Diam: 2.40 cm Dietrich Pates MD Electronically signed by Dietrich Pates MD Signature Date/Time: 08/25/2020/7:52:57 PM    Final     Labs:  CBC: Recent Labs    08/28/20 0431 08/29/20 0322 09/13/20 1045 09/13/20 1821 09/14/20 0525  WBC 19.4* 17.6* 10.6*  --  8.3  HGB 9.4* 9.4* 6.2* 6.3* 7.6*  HCT 25.5* 26.3* 18.9* 19.0* 22.6*  PLT 95* 112* 170  --  140*    COAGS: Recent Labs    08/22/20 1817 08/25/20 0349 08/27/20 0454 08/29/20 0322 09/13/20 1045 09/14/20 0525  INR 1.4*   < > 1.9* 1.8* 1.4* 1.5*  APTT 42*  --   --   --   --   --    < > = values in this interval not  displayed.    BMP: Recent Labs    08/28/20 0431 08/29/20 0322 09/13/20 1045 09/14/20 0525  NA 139 135 128* 131*  K 3.9 4.3 4.3 3.9  CL 104 101 94* 101  CO2 21* 22 21* 22  GLUCOSE 160* 174* 112* 139*  BUN 61* 45* 17 16  CALCIUM 8.2* 8.0* 8.4* 7.8*  CREATININE 1.08 0.99 0.59* 0.44*  GFRNONAA >60 >60 >60 >60    LIVER FUNCTION TESTS: Recent Labs    08/28/20 0431 08/29/20 0322  09/13/20 1045 09/14/20 0525  BILITOT 6.5* 5.3* 2.3* 2.0*  AST 191* 128* 40 30  ALT 123* 115* 42 32  ALKPHOS 274* 244* 156* 130*  PROT 6.7 6.4* 6.7 6.0*  ALBUMIN 3.2* 2.9* 2.2* 2.0*    TUMOR MARKERS: No results for input(s): AFPTM, CEA, CA199, CHROMGRNA in the last 8760 hours.  Assessment and Plan: 35 y.o. male with past medical history of alcoholic cirrhosis, asthma, hypertension, depression who was admitted to [facility] on 2/22 with worsening left hip pain/left leg swelling.  He had imaging in July 2021 that did not demonstrate any significant bony abnormality.  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  Patient was tentatively scheduled to undergo left hip replacement in early March at outside facility.  He has since then had several medical complications including thrombocytopenia, anemia, liver failure, COVID-19, progressive inability to ambulate as well as acute renal failure.  Patient now has difficulty placing weight on the left lower extremity.  CT abdomen pelvis performed yesterday revealed:   1. Findings consistent with hepatic cirrhosis. 2. Interval development of innumerable fluid collections are seen within the musculature of the left ileopsoas muscle as well as the musculature of the visualized portion of the left gluteal region and proximal left thigh consistent with innumerable abscesses that have formed within the musculature, several of which are quite large. Diffuse swelling is noted, and there is noted significant destruction of the left femoral head which may be related to chronic septic arthritis or inflammation. Left hip effusion is noted. 3. Mild subcutaneous edema or inflammation is seen involving the visualized portions of the left upper thigh and hip region  Patient is currently afebrile, blood cultures growing staph aureus, WBC 8.3, hemoglobin 7.6,  creatinine 0.44, total bilirubin 2, PT 18, INR 1.5, latest COVID-19 negative, lactic acid 2.5,, ammonia 45 yesterday.  Request now received from orthopedic surgery for image guided drainage of left iliopsoas fluid collection as well as aspiration/ possible drainage of left thigh fluid collections.  Latest imaging studies have been reviewed by Dr. Grace Isaac and case discussed extensively with Dr. Dion Saucier.  We will plan procedure for later this morning.  Details/risks of procedure, including but not limited to, internal bleeding, infection, injury to adjacent structures discussed with patient and girlfriend with their understanding and consent.   Thank you for this interesting consult.  I greatly enjoyed meeting [name] and look forward to participating in their care.  A copy of this report was sent to the requesting provider on this date.  Electronically Signed: D. Jeananne Rama, PA-C 09/14/2020, 9:48 AM   I spent a total of 30 minutes     in face to face in clinical consultation, greater than 50% of which was counseling/coordinating care for image guided aspiration/drainage of left iliopsoas and possibly left fluid collections

## 2020-09-15 ENCOUNTER — Inpatient Hospital Stay (HOSPITAL_COMMUNITY): Payer: 59 | Admitting: Certified Registered"

## 2020-09-15 ENCOUNTER — Inpatient Hospital Stay (HOSPITAL_COMMUNITY): Payer: 59

## 2020-09-15 ENCOUNTER — Encounter (HOSPITAL_COMMUNITY)
Admission: EM | Disposition: A | Discharge status: Rehabilitation Facility | Payer: Self-pay | Source: Home / Self Care | Attending: Internal Medicine

## 2020-09-15 ENCOUNTER — Encounter (HOSPITAL_COMMUNITY): Payer: Self-pay | Admitting: Internal Medicine

## 2020-09-15 DIAGNOSIS — L02416 Cutaneous abscess of left lower limb: Secondary | ICD-10-CM

## 2020-09-15 DIAGNOSIS — M00052 Staphylococcal arthritis, left hip: Secondary | ICD-10-CM

## 2020-09-15 DIAGNOSIS — B9561 Methicillin susceptible Staphylococcus aureus infection as the cause of diseases classified elsewhere: Secondary | ICD-10-CM | POA: Diagnosis not present

## 2020-09-15 DIAGNOSIS — K6812 Psoas muscle abscess: Secondary | ICD-10-CM | POA: Diagnosis not present

## 2020-09-15 DIAGNOSIS — R7881 Bacteremia: Secondary | ICD-10-CM | POA: Diagnosis not present

## 2020-09-15 DIAGNOSIS — K7010 Alcoholic hepatitis without ascites: Secondary | ICD-10-CM

## 2020-09-15 DIAGNOSIS — F1029 Alcohol dependence with unspecified alcohol-induced disorder: Secondary | ICD-10-CM | POA: Diagnosis not present

## 2020-09-15 DIAGNOSIS — K7030 Alcoholic cirrhosis of liver without ascites: Secondary | ICD-10-CM | POA: Diagnosis not present

## 2020-09-15 HISTORY — PX: TOTAL HIP ARTHROPLASTY: SHX124

## 2020-09-15 LAB — CBC WITH DIFFERENTIAL/PLATELET
Abs Immature Granulocytes: 0.95 10*3/uL — ABNORMAL HIGH (ref 0.00–0.07)
Basophils Absolute: 0 10*3/uL (ref 0.0–0.1)
Basophils Relative: 0 %
Eosinophils Absolute: 0.1 10*3/uL (ref 0.0–0.5)
Eosinophils Relative: 0 %
HCT: 24.4 % — ABNORMAL LOW (ref 39.0–52.0)
Hemoglobin: 8 g/dL — ABNORMAL LOW (ref 13.0–17.0)
Immature Granulocytes: 7 %
Lymphocytes Relative: 10 %
Lymphs Abs: 1.3 10*3/uL (ref 0.7–4.0)
MCH: 33.9 pg (ref 26.0–34.0)
MCHC: 32.8 g/dL (ref 30.0–36.0)
MCV: 103.4 fL — ABNORMAL HIGH (ref 80.0–100.0)
Monocytes Absolute: 0.3 10*3/uL (ref 0.1–1.0)
Monocytes Relative: 2 %
Neutro Abs: 10.2 10*3/uL — ABNORMAL HIGH (ref 1.7–7.7)
Neutrophils Relative %: 81 %
Platelets: 139 10*3/uL — ABNORMAL LOW (ref 150–400)
RBC: 2.36 MIL/uL — ABNORMAL LOW (ref 4.22–5.81)
RDW: 16.2 % — ABNORMAL HIGH (ref 11.5–15.5)
WBC: 12.8 10*3/uL — ABNORMAL HIGH (ref 4.0–10.5)
nRBC: 1.6 % — ABNORMAL HIGH (ref 0.0–0.2)

## 2020-09-15 LAB — PROTIME-INR
INR: 2.2 — ABNORMAL HIGH (ref 0.8–1.2)
Prothrombin Time: 23.6 seconds — ABNORMAL HIGH (ref 11.4–15.2)

## 2020-09-15 LAB — COMPREHENSIVE METABOLIC PANEL
ALT: 29 U/L (ref 0–44)
ALT: 16 U/L (ref 0–44)
AST: 42 U/L — ABNORMAL HIGH (ref 15–41)
AST: 38 U/L (ref 15–41)
Albumin: 1.9 g/dL — ABNORMAL LOW (ref 3.5–5.0)
Albumin: 2.1 g/dL — ABNORMAL LOW (ref 3.5–5.0)
Alkaline Phosphatase: 191 U/L — ABNORMAL HIGH (ref 38–126)
Alkaline Phosphatase: 123 U/L (ref 38–126)
Anion gap: 10 (ref 5–15)
Anion gap: 11 (ref 5–15)
BUN: 12 mg/dL (ref 6–20)
BUN: 16 mg/dL (ref 6–20)
CO2: 20 mmol/L — ABNORMAL LOW (ref 22–32)
CO2: 18 mmol/L — ABNORMAL LOW (ref 22–32)
Calcium: 7.8 mg/dL — ABNORMAL LOW (ref 8.9–10.3)
Calcium: 7.1 mg/dL — ABNORMAL LOW (ref 8.9–10.3)
Chloride: 98 mmol/L (ref 98–111)
Chloride: 103 mmol/L (ref 98–111)
Creatinine, Ser: 0.54 mg/dL — ABNORMAL LOW (ref 0.61–1.24)
Creatinine, Ser: 0.67 mg/dL (ref 0.61–1.24)
GFR, Estimated: >60 mL/min (ref >60)
GFR, Estimated: >60 mL/min (ref >60)
Glucose, Bld: 100 mg/dL — ABNORMAL HIGH (ref 70–99)
Glucose, Bld: 105 mg/dL — ABNORMAL HIGH (ref 70–99)
Potassium: 3.8 mmol/L (ref 3.5–5.1)
Potassium: 4.5 mmol/L (ref 3.5–5.1)
Sodium: 128 mmol/L — ABNORMAL LOW (ref 135–145)
Sodium: 132 mmol/L — ABNORMAL LOW (ref 135–145)
Total Bilirubin: 1.5 mg/dL — ABNORMAL HIGH (ref 0.3–1.2)
Total Bilirubin: 3.2 mg/dL — ABNORMAL HIGH (ref 0.3–1.2)
Total Protein: 5.8 g/dL — ABNORMAL LOW (ref 6.5–8.1)
Total Protein: 4.6 g/dL — ABNORMAL LOW (ref 6.5–8.1)

## 2020-09-15 LAB — CORTISOL: Cortisol, Plasma: 27.8 ug/dL

## 2020-09-15 LAB — POCT I-STAT EG7
Acid-base deficit: 6 mmol/L — ABNORMAL HIGH (ref 0.0–2.0)
Bicarbonate: 22.4 mmol/L (ref 20.0–28.0)
Calcium, Ion: 1.2 mmol/L (ref 1.15–1.40)
HCT: 26 % — ABNORMAL LOW (ref 39.0–52.0)
Hemoglobin: 8.8 g/dL — ABNORMAL LOW (ref 13.0–17.0)
O2 Saturation: 64 %
Potassium: 4.5 mmol/L (ref 3.5–5.1)
Sodium: 130 mmol/L — ABNORMAL LOW (ref 135–145)
TCO2: 24 mmol/L (ref 22–32)
pCO2, Ven: 59.4 mmHg (ref 44.0–60.0)
pH, Ven: 7.184 — CL (ref 7.250–7.430)
pO2, Ven: 42 mmHg (ref 32.0–45.0)

## 2020-09-15 LAB — CBC
HCT: 24 % — ABNORMAL LOW (ref 39.0–52.0)
Hemoglobin: 8 g/dL — ABNORMAL LOW (ref 13.0–17.0)
MCH: 32.4 pg (ref 26.0–34.0)
MCHC: 33.3 g/dL (ref 30.0–36.0)
MCV: 97.2 fL (ref 80.0–100.0)
Platelets: 86 10*3/uL — ABNORMAL LOW (ref 150–400)
RBC: 2.47 MIL/uL — ABNORMAL LOW (ref 4.22–5.81)
RDW: 18.6 % — ABNORMAL HIGH (ref 11.5–15.5)
WBC: 24.4 10*3/uL — ABNORMAL HIGH (ref 4.0–10.5)
nRBC: 2.3 % — ABNORMAL HIGH (ref 0.0–0.2)

## 2020-09-15 LAB — LACTIC ACID, PLASMA
Lactic Acid, Venous: 5 mmol/L (ref 0.5–1.9)
Lactic Acid, Venous: 5.3 mmol/L (ref 0.5–1.9)

## 2020-09-15 LAB — PHOSPHORUS
Phosphorus: 3.2 mg/dL (ref 2.5–4.6)
Phosphorus: 4 mg/dL (ref 2.5–4.6)

## 2020-09-15 LAB — MAGNESIUM
Magnesium: 1.8 mg/dL (ref 1.7–2.4)
Magnesium: 2 mg/dL (ref 1.7–2.4)

## 2020-09-15 LAB — PREPARE RBC (CROSSMATCH)

## 2020-09-15 LAB — GLUCOSE, CAPILLARY
Glucose-Capillary: 61 mg/dL — ABNORMAL LOW (ref 70–99)
Glucose-Capillary: 86 mg/dL (ref 70–99)

## 2020-09-15 LAB — MRSA PCR SCREENING: MRSA by PCR: NEGATIVE

## 2020-09-15 SURGERY — ARTHROPLASTY, HIP, TOTAL,POSTERIOR APPROACH
Anesthesia: General | Site: Hip | Laterality: Left

## 2020-09-15 MED ORDER — TOBRAMYCIN SULFATE 1.2 G IJ SOLR
INTRAMUSCULAR | Status: DC | PRN
  Administered 2020-09-15: 3.8 g

## 2020-09-15 MED ORDER — ALBUMIN HUMAN 5 % IV SOLN: 25.0000 g | Freq: Once | INTRAVENOUS | Status: DC

## 2020-09-15 MED ORDER — BUPIVACAINE HCL (PF) 0.25 % IJ SOLN
INTRAMUSCULAR | Status: DC | PRN
  Administered 2020-09-15: 30 mL

## 2020-09-15 MED ORDER — DOCUSATE SODIUM 100 MG PO CAPS
100.0000 mg | ORAL_CAPSULE | Freq: Two times a day (BID) | ORAL | Status: DC | PRN
  Filled 2020-09-15: qty 1

## 2020-09-15 MED ORDER — PROPOFOL 10 MG/ML IV BOLUS
INTRAVENOUS | Status: AC
  Filled 2020-09-15: qty 20

## 2020-09-15 MED ORDER — CHLORHEXIDINE GLUCONATE CLOTH 2 % EX PADS
6.0000 | MEDICATED_PAD | Freq: Every day | CUTANEOUS | Status: DC
  Administered 2020-09-15 – 2020-10-10 (×21): 6 via TOPICAL

## 2020-09-15 MED ORDER — ALBUMIN HUMAN 5 % IV SOLN
25.0000 g | Freq: Once | INTRAVENOUS | Status: AC
  Administered 2020-09-15: 12.5 g via INTRAVENOUS

## 2020-09-15 MED ORDER — ACETAMINOPHEN 500 MG PO TABS
1000.0000 mg | ORAL_TABLET | Freq: Once | ORAL | Status: AC
  Administered 2020-09-15: 1000 mg via ORAL
  Filled 2020-09-15: qty 2

## 2020-09-15 MED ORDER — TOBRAMYCIN SULFATE 1.2 G IJ SOLR
INTRAMUSCULAR | Status: AC
  Filled 2020-09-15: qty 2.4

## 2020-09-15 MED ORDER — TRANEXAMIC ACID-NACL 1000-0.7 MG/100ML-% IV SOLN
1000.0000 mg | Freq: Once | INTRAVENOUS | Status: AC
  Administered 2020-09-15: 1000 mg via INTRAVENOUS
  Filled 2020-09-15: qty 100

## 2020-09-15 MED ORDER — PROPOFOL 10 MG/ML IV BOLUS
INTRAVENOUS | Status: DC | PRN
  Administered 2020-09-15: 200 mg via INTRAVENOUS

## 2020-09-15 MED ORDER — VANCOMYCIN HCL 1000 MG IV SOLR
INTRAVENOUS | Status: AC
  Filled 2020-09-15: qty 1000

## 2020-09-15 MED ORDER — ALUM & MAG HYDROXIDE-SIMETH 200-200-20 MG/5ML PO SUSP
30.0000 mL | ORAL | Status: DC | PRN
Start: 2020-09-15 — End: 2020-09-15

## 2020-09-15 MED ORDER — ALBUMIN HUMAN 5 % IV SOLN
INTRAVENOUS | Status: AC
  Administered 2020-09-15: 12.5 g via INTRAVENOUS
  Filled 2020-09-15: qty 500

## 2020-09-15 MED ORDER — SODIUM CHLORIDE 0.9 % IV SOLN: 250.0000 mL | INTRAVENOUS | Status: DC

## 2020-09-15 MED ORDER — ONDANSETRON HCL 4 MG/2ML IJ SOLN
INTRAMUSCULAR | Status: DC | PRN
  Administered 2020-09-15: 4 mg via INTRAVENOUS

## 2020-09-15 MED ORDER — DEXMEDETOMIDINE (PRECEDEX) IN NS 20 MCG/5ML (4 MCG/ML) IV SYRINGE
PREFILLED_SYRINGE | INTRAVENOUS | Status: DC | PRN
  Administered 2020-09-15: 4 ug via INTRAVENOUS
  Administered 2020-09-15: 16 ug via INTRAVENOUS

## 2020-09-15 MED ORDER — LIDOCAINE 2% (20 MG/ML) 5 ML SYRINGE
INTRAMUSCULAR | Status: DC | PRN
  Administered 2020-09-15: 100 mg via INTRAVENOUS

## 2020-09-15 MED ORDER — VANCOMYCIN HCL 1000 MG IV SOLR
INTRAVENOUS | Status: AC
  Filled 2020-09-15: qty 3000

## 2020-09-15 MED ORDER — MIDAZOLAM HCL 2 MG/2ML IJ SOLN
INTRAMUSCULAR | Status: AC
  Filled 2020-09-15: qty 2

## 2020-09-15 MED ORDER — SODIUM CHLORIDE 0.9% IV SOLUTION: Freq: Once | INTRAVENOUS | Status: AC

## 2020-09-15 MED ORDER — KETOROLAC TROMETHAMINE 30 MG/ML IJ SOLN
INTRAMUSCULAR | Status: AC
  Filled 2020-09-15: qty 1

## 2020-09-15 MED ORDER — NOREPINEPHRINE 4 MG/250ML-% IV SOLN
2.0000 ug/min | INTRAVENOUS | Status: DC
  Administered 2020-09-15: 4 ug/min via INTRAVENOUS
  Administered 2020-09-16: 6 ug/min via INTRAVENOUS
  Filled 2020-09-15 (×2): qty 250

## 2020-09-15 MED ORDER — HEPARIN SODIUM (PORCINE) 5000 UNIT/ML IJ SOLN: 5000.0000 [IU] | Freq: Three times a day (TID) | INTRAMUSCULAR | Status: DC

## 2020-09-15 MED ORDER — PROMETHAZINE HCL 25 MG/ML IJ SOLN
6.2500 mg | INTRAMUSCULAR | Status: DC | PRN
Start: 2020-09-15 — End: 2020-09-15

## 2020-09-15 MED ORDER — LACTATED RINGERS IV SOLN: INTRAVENOUS | Status: DC

## 2020-09-15 MED ORDER — SUGAMMADEX SODIUM 200 MG/2ML IV SOLN
INTRAVENOUS | Status: DC | PRN
  Administered 2020-09-15: 200 mg via INTRAVENOUS

## 2020-09-15 MED ORDER — DEXTROSE 50 % IV SOLN
INTRAVENOUS | Status: AC
  Administered 2020-09-15: 12.5 g via INTRAVENOUS
  Filled 2020-09-15: qty 50

## 2020-09-15 MED ORDER — ACETAMINOPHEN 500 MG PO TABS
ORAL_TABLET | ORAL | Status: AC
  Filled 2020-09-15: qty 2

## 2020-09-15 MED ORDER — PHENYLEPHRINE HCL-NACL 10-0.9 MG/250ML-% IV SOLN
25.0000 ug/min | INTRAVENOUS | Status: DC
  Administered 2020-09-15: 105 ug/min via INTRAVENOUS
  Administered 2020-09-16: 85 ug/min via INTRAVENOUS
  Administered 2020-09-16: 55 ug/min via INTRAVENOUS
  Filled 2020-09-15 (×3): qty 250

## 2020-09-15 MED ORDER — ORAL CARE MOUTH RINSE
15.0000 mL | Freq: Two times a day (BID) | OROMUCOSAL | Status: DC
  Administered 2020-09-15 – 2020-10-10 (×42): 15 mL via OROMUCOSAL

## 2020-09-15 MED ORDER — PHENYLEPHRINE 40 MCG/ML (10ML) SYRINGE FOR IV PUSH (FOR BLOOD PRESSURE SUPPORT)
PREFILLED_SYRINGE | INTRAVENOUS | Status: DC | PRN
  Administered 2020-09-15 (×6): 40 ug via INTRAVENOUS

## 2020-09-15 MED ORDER — VANCOMYCIN HCL 1000 MG IV SOLR
INTRAVENOUS | Status: DC | PRN
  Administered 2020-09-15: 4000 mg

## 2020-09-15 MED ORDER — PHENYLEPHRINE HCL-NACL 10-0.9 MG/250ML-% IV SOLN
INTRAVENOUS | Status: AC
  Administered 2020-09-15: 115 ug/min via INTRAVENOUS
  Filled 2020-09-15: qty 250

## 2020-09-15 MED ORDER — TOBRAMYCIN SULFATE 1.2 G IJ SOLR
INTRAMUSCULAR | Status: AC
  Filled 2020-09-15: qty 1.2

## 2020-09-15 MED ORDER — KETAMINE HCL 10 MG/ML IJ SOLN
INTRAMUSCULAR | Status: AC
  Filled 2020-09-15: qty 1

## 2020-09-15 MED ORDER — ONDANSETRON HCL 4 MG/2ML IJ SOLN
INTRAMUSCULAR | Status: AC
  Filled 2020-09-15: qty 2

## 2020-09-15 MED ORDER — POVIDONE-IODINE 10 % EX SWAB
2.0000 "application " | Freq: Once | CUTANEOUS | Status: AC
  Administered 2020-09-15: 2 via TOPICAL

## 2020-09-15 MED ORDER — FENTANYL CITRATE (PF) 250 MCG/5ML IJ SOLN
INTRAMUSCULAR | Status: DC | PRN
  Administered 2020-09-15: 100 ug via INTRAVENOUS

## 2020-09-15 MED ORDER — BUPIVACAINE HCL 0.25 % IJ SOLN
INTRAMUSCULAR | Status: AC
  Filled 2020-09-15: qty 1

## 2020-09-15 MED ORDER — MIDAZOLAM HCL 2 MG/2ML IJ SOLN
INTRAMUSCULAR | Status: DC | PRN
  Administered 2020-09-15: 2 mg via INTRAVENOUS

## 2020-09-15 MED ORDER — PHENYLEPHRINE HCL (PRESSORS) 10 MG/ML IV SOLN
INTRAVENOUS | Status: AC
  Filled 2020-09-15: qty 1

## 2020-09-15 MED ORDER — CHLORHEXIDINE GLUCONATE 4 % EX LIQD
60.0000 mL | Freq: Once | CUTANEOUS | Status: AC
  Administered 2020-09-15: 4 via TOPICAL

## 2020-09-15 MED ORDER — POLYETHYLENE GLYCOL 3350 17 G PO PACK
17.0000 g | PACK | Freq: Every day | ORAL | Status: DC | PRN
  Filled 2020-09-15: qty 1

## 2020-09-15 MED ORDER — LACTATED RINGERS IV SOLN: INTRAVENOUS | Status: DC | PRN

## 2020-09-15 MED ORDER — DEXAMETHASONE SODIUM PHOSPHATE 10 MG/ML IJ SOLN
INTRAMUSCULAR | Status: AC
  Filled 2020-09-15: qty 1

## 2020-09-15 MED ORDER — SODIUM CHLORIDE 0.9 % IR SOLN
Status: DC | PRN
  Administered 2020-09-15: 3000 mL

## 2020-09-15 MED ORDER — LACTATED RINGERS IV BOLUS
1000.0000 mL | Freq: Once | INTRAVENOUS | Status: AC
  Administered 2020-09-16: 1000 mL via INTRAVENOUS

## 2020-09-15 MED ORDER — HYDROMORPHONE HCL 1 MG/ML IJ SOLN: 0.2500 mg | INTRAMUSCULAR | Status: DC | PRN

## 2020-09-15 MED ORDER — DEXTROSE 50 % IV SOLN: 12.5000 g | INTRAVENOUS | Status: AC

## 2020-09-15 MED ORDER — SODIUM CHLORIDE 0.9 % IV BOLUS
1000.0000 mL | Freq: Once | INTRAVENOUS | Status: AC
  Administered 2020-09-15: 1000 mL via INTRAVENOUS

## 2020-09-15 MED ORDER — DEXAMETHASONE SODIUM PHOSPHATE 10 MG/ML IJ SOLN
INTRAMUSCULAR | Status: DC | PRN
  Administered 2020-09-15: 10 mg via INTRAVENOUS

## 2020-09-15 MED ORDER — LIP MEDEX EX OINT
TOPICAL_OINTMENT | CUTANEOUS | Status: AC
  Filled 2020-09-15: qty 7

## 2020-09-15 MED ORDER — ROCURONIUM BROMIDE 10 MG/ML (PF) SYRINGE
PREFILLED_SYRINGE | INTRAVENOUS | Status: DC | PRN
  Administered 2020-09-15: 60 mg via INTRAVENOUS
  Administered 2020-09-15 (×3): 20 mg via INTRAVENOUS

## 2020-09-15 MED ORDER — SODIUM CHLORIDE 0.9 % IV SOLN: INTRAVENOUS | Status: DC | PRN

## 2020-09-15 MED ORDER — KETOROLAC TROMETHAMINE 30 MG/ML IJ SOLN
INTRAMUSCULAR | Status: DC | PRN
  Administered 2020-09-15: 30 mg

## 2020-09-15 MED ORDER — MAGNESIUM SULFATE 2 GM/50ML IV SOLN
2.0000 g | Freq: Once | INTRAVENOUS | Status: AC
  Administered 2020-09-15: 2 g via INTRAVENOUS
  Filled 2020-09-15: qty 50

## 2020-09-15 MED ORDER — PHENYLEPHRINE HCL-NACL 10-0.9 MG/250ML-% IV SOLN
INTRAVENOUS | Status: DC | PRN
  Administered 2020-09-15: 20 ug/min via INTRAVENOUS

## 2020-09-15 MED ORDER — KETAMINE HCL 10 MG/ML IJ SOLN
INTRAMUSCULAR | Status: DC | PRN
  Administered 2020-09-15 (×4): 10 mg via INTRAVENOUS

## 2020-09-15 MED ORDER — STERILE WATER FOR IRRIGATION IR SOLN
Status: DC | PRN
  Administered 2020-09-15: 2000 mL

## 2020-09-15 MED ORDER — SODIUM CHLORIDE 0.9% IV SOLUTION: Freq: Once | INTRAVENOUS | Status: DC

## 2020-09-15 MED ORDER — ALBUMIN HUMAN 5 % IV SOLN: INTRAVENOUS | Status: DC | PRN

## 2020-09-15 MED ORDER — FENTANYL CITRATE (PF) 100 MCG/2ML IJ SOLN
INTRAMUSCULAR | Status: AC
  Filled 2020-09-15: qty 2

## 2020-09-15 MED ORDER — CHLORHEXIDINE GLUCONATE 0.12 % MT SOLN
15.0000 mL | Freq: Once | OROMUCOSAL | Status: AC
  Administered 2020-09-15: 15 mL via OROMUCOSAL

## 2020-09-15 MED ORDER — TRANEXAMIC ACID-NACL 1000-0.7 MG/100ML-% IV SOLN
1000.0000 mg | INTRAVENOUS | Status: AC
  Administered 2020-09-15: 1000 mg via INTRAVENOUS
  Filled 2020-09-15: qty 100

## 2020-09-15 SURGICAL SUPPLY — 58 items
BIT DRILL 2.0X128 (BIT) ×2 IMPLANT
BLADE SAW SAG 73X25 THK (BLADE) ×1
BLADE SAW SGTL 73X25 THK (BLADE) ×1 IMPLANT
CEMENT HV SMART SET (Cement) ×4 IMPLANT
CLSR STERI-STRIP ANTIMIC 1/2X4 (GAUZE/BANDAGES/DRESSINGS) ×4 IMPLANT
COVER SURGICAL LIGHT HANDLE (MISCELLANEOUS) ×2 IMPLANT
COVER WAND RF STERILE (DRAPES) IMPLANT
DRAPE INCISE IOBAN 66X45 STRL (DRAPES) ×2 IMPLANT
DRAPE ORTHO SPLIT 77X108 STRL (DRAPES) ×4
DRAPE POUCH INSTRU U-SHP 10X18 (DRAPES) ×2 IMPLANT
DRAPE SHEET LG 3/4 BI-LAMINATE (DRAPES) ×2 IMPLANT
DRAPE SURG 17X11 SM STRL (DRAPES) ×2 IMPLANT
DRAPE SURG ORHT 6 SPLT 77X108 (DRAPES) ×2 IMPLANT
DRAPE U-SHAPE 47X51 STRL (DRAPES) ×2 IMPLANT
DRSG MEPILEX BORDER 4X12 (GAUZE/BANDAGES/DRESSINGS) ×1 IMPLANT
DRSG MEPILEX BORDER 4X8 (GAUZE/BANDAGES/DRESSINGS) ×2 IMPLANT
DURAPREP 26ML APPLICATOR (WOUND CARE) ×4 IMPLANT
ELECT BLADE TIP CTD 4 INCH (ELECTRODE) ×2 IMPLANT
ELECT REM PT RETURN 15FT ADLT (MISCELLANEOUS) ×2 IMPLANT
FACESHIELD WRAPAROUND (MASK) ×2 IMPLANT
FACESHIELD WRAPAROUND OR TEAM (MASK) ×1 IMPLANT
GAUZE XEROFORM 1X8 LF (GAUZE/BANDAGES/DRESSINGS) ×1 IMPLANT
GLOVE SRG 8 PF TXTR STRL LF DI (GLOVE) ×1 IMPLANT
GLOVE SURG ENC MOIS LTX SZ7 (GLOVE) ×2 IMPLANT
GLOVE SURG ENC MOIS LTX SZ7.5 (GLOVE) ×2 IMPLANT
GLOVE SURG UNDER LTX SZ6.5 (GLOVE) ×2 IMPLANT
GLOVE SURG UNDER POLY LF SZ8 (GLOVE) ×2
GOWN STRL REUS W/TWL LRG LVL3 (GOWN DISPOSABLE) ×4 IMPLANT
HEAD FEM STD 32X+1 STRL (Hips) ×1 IMPLANT
HOOD PEEL AWAY FLYTE STAYCOOL (MISCELLANEOUS) ×6 IMPLANT
KIT BASIN OR (CUSTOM PROCEDURE TRAY) ×2 IMPLANT
KIT TURNOVER KIT A (KITS) ×2 IMPLANT
LINER ACET CUP 42MMX32MM (Hips) ×1 IMPLANT
MANIFOLD NEPTUNE II (INSTRUMENTS) ×2 IMPLANT
NDL MA TROC 1/2 (NEEDLE) IMPLANT
NDL SAFETY ECLIPSE 18X1.5 (NEEDLE) ×2 IMPLANT
NEEDLE HYPO 18GX1.5 SHARP (NEEDLE) ×4
NEEDLE MA TROC 1/2 (NEEDLE) IMPLANT
NS IRRIG 1000ML POUR BTL (IV SOLUTION) ×2 IMPLANT
PACK TOTAL JOINT (CUSTOM PROCEDURE TRAY) ×2 IMPLANT
PENCIL SMOKE EVACUATOR (MISCELLANEOUS) IMPLANT
PROTECTOR NERVE ULNAR (MISCELLANEOUS) ×2 IMPLANT
RETRIEVER SUT HEWSON (MISCELLANEOUS) ×2 IMPLANT
STEM CEMENTED SUMMIT SZ2 (Stem) ×1 IMPLANT
STRIP CLOSURE SKIN 1/2X4 (GAUZE/BANDAGES/DRESSINGS) ×1 IMPLANT
SUCTION FRAZIER HANDLE 12FR (TUBING) ×2
SUCTION TUBE FRAZIER 12FR DISP (TUBING) ×1 IMPLANT
SUT FIBERWIRE #2 38 REV NDL BL (SUTURE) ×6
SUT VIC AB 0 CT1 36 (SUTURE) ×2 IMPLANT
SUT VIC AB 1 CT1 36 (SUTURE) ×4 IMPLANT
SUT VIC AB 2-0 CT1 27 (SUTURE) ×4
SUT VIC AB 2-0 CT1 TAPERPNT 27 (SUTURE) ×2 IMPLANT
SUT VIC AB 3-0 SH 8-18 (SUTURE) ×2 IMPLANT
SUTURE FIBERWR#2 38 REV NDL BL (SUTURE) ×3 IMPLANT
SYR CONTROL 10ML LL (SYRINGE) ×4 IMPLANT
TOWEL OR 17X26 10 PK STRL BLUE (TOWEL DISPOSABLE) ×2 IMPLANT
TRAY FOLEY MTR SLVR 16FR STAT (SET/KITS/TRAYS/PACK) ×2 IMPLANT
WATER STERILE IRR 1000ML POUR (IV SOLUTION) ×4 IMPLANT

## 2020-09-15 NOTE — Anesthesia Procedure Notes (Signed)
Procedure Name: Intubation Date/Time: 09/15/2020 3:58 PM Performed by: Eben Burow, CRNA Pre-anesthesia Checklist: Patient identified, Emergency Drugs available, Suction available, Patient being monitored and Timeout performed Patient Re-evaluated:Patient Re-evaluated prior to induction Oxygen Delivery Method: Circle system utilized Preoxygenation: Pre-oxygenation with 100% oxygen Induction Type: IV induction Ventilation: Mask ventilation without difficulty Laryngoscope Size: Mac and 4 Grade View: Grade I Tube type: Oral Tube size: 7.5 mm Number of attempts: 1 Airway Equipment and Method: Stylet Placement Confirmation: ETT inserted through vocal cords under direct vision,  positive ETCO2 and breath sounds checked- equal and bilateral Secured at: 23 cm Tube secured with: Tape Dental Injury: Teeth and Oropharynx as per pre-operative assessment

## 2020-09-15 NOTE — Op Note (Signed)
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  Left total hip arthroplasty with antibiotic impregnated cement interposition spacer 2.  Left hip excisional debridement, skin, subcutaneous tissue, muscle, bone, deep abscess  Debridement type: Excisional Debridement  Side: left  Body Location: hip and pelvis   Tools used for debridement: scalpel, scissors, curette and rongeur  Pre-debridement Wound size (cm):   Length: 0        Width: 0     Depth: 0   Post-debridement Wound size (cm):   Length: 15        Width: 4     Depth: 6, but then the wound was closed.   Debridement depth beyond dead/damaged tissue down to healthy viable tissue: yes  Tissue layer involved: skin, subcutaneous tissue, muscle / fascia, bone  Nature of tissue removed: Necrotic, Devitalized Tissue, Non-viable tissue and Purulence  Irrigation volume: 9 L     Irrigation fluid type: Normal Saline       PREOPERATIVE INDICATIONS:    [name] is an 35 y.o. male who has a diagnosis of MSSA bacteremia with left hip osteomyelitis with psoas abscess tracking from the psoas down into the hip and pelvis and into the thigh who elected for surgical management.  The risks benefits and alternatives were discussed with the patient including but not limited to the risks of nonoperative treatment, versus surgical intervention including infection, bleeding, nerve injury, periprosthetic fracture, the need for revision surgery, dislocation, leg length discrepancy, blood clots, cardiopulmonary complications, morbidity, mortality, among others, and they were willing to proceed.  It was clearly discussed he was going to need multiple operations, and this was a life-threatening condition.   OPERATIVE REPORT      SURGEON:  Marchia Bond, MD    ASSISTANT:  Merlene Pulling, PA-C, (Present throughout the entire procedure,  necessary for completion of procedure in a timely manner, assisting with retraction, instrumentation, and closure)     ANESTHESIA: General  ESTIMATED BLOOD LOSS: 1200 mL, although it was extremely difficult to estimate, he did get 2 units of packed red blood cells intraoperatively, and intraoperative hemoglobin was 8.8    COMPLICATIONS:  None.     UNIQUE ASPECTS OF THE CASE: He had purulence tracking through the gluteal muscles, as well as from anteriorly around the thigh, as well as diffusely and I was not 100% sure if he had pus draining out of his femur, but it is certainly likely.  He definitely had some degree of erosion within the acetabulum as well.  COMPONENTS:  [employer] [place] basic femur size 2 with antibiotic impregnated cement around the stem, with a +2.0 x 32 metallic head ball, and a cemented all polyethylene Prostalac liner from [employer], size 42 outer, 32 inner diameter.    PROCEDURE IN DETAIL:   The patient was met in the holding area and  identified.  The appropriate hip was identified and marked at the operative site.  The patient was then transported to the OR  and  placed under anesthesia.  At that point, the patient was  placed in the lateral decubitus position with the operative side up and  secured to the operating room table and all bony prominences padded.     The operative lower extremity was prepped from the iliac crest to the distal leg.  I kept the drains that were in his anterior thigh proximally and distally, and prepped around them.  Sterile draping was performed.  Time out was performed prior to incision.      A routine posterolateral approach was utilized via sharp dissection  carried down to the subcutaneous tissue.  Gross bleeders were Bovie coagulated.  Immediately upon getting deep to the iliotibial band and gluteal musculature I encountered a  substantial amount of purulence, probably 300 to 500 mL.  The iliotibial band was identified and incised along the length of the skin incision.  Self-retaining retractors were  inserted.  I excised the necrotic tissue with a Bovie cautery, as well as a rondure.  With the hip internally rotated, the short external rotators  were identified.  The piriformis and capsule was released in a T-type fashion, and necrotic areas of capsule were excised.  The femoral head was exposed, and was almost completely necrotic.  I resected the femoral neck using the appropriate jig. This was performed at approximately a thumb's breadth above the lesser trochanter.    I then exposed the deep acetabulum, cleared out any tissue including the ligamentum teres. After adequate visualization, I excised the labrum, and then sequentially reamed gently, just removing the cartilage, as well as any purulent material including purulence in the fossa.  I then cemented in a Prostalac liner into the appropriate position using a jig.  Appropriate version and inclination was confirmed clinically matching their bony anatomy, and also with the use of the jig.  I then opened the femur with the canal finder, broached with a size 1, used the lateralizer and then broached with a size 2.  I placed a size 2 trial broach, with a +1.5, and reduce the hip.  It was a little bit tight but was stable.  Upon the dislocation, it disengaged the Prostalac liner.  Therefore I went back to the acetabulum, and remove the cement, and irrigated everything copiously once more using pulse lavage.  I then did a second round of cementing on the liner, and this time also placed cement around the femoral stem, and implanted the femoral stem.  Both the liner and the stem were cemented using relatively poor technique in order to optimize the future removal.  Once the cement had cured I placed the real femoral head.   I then prepared the proximal femur using the  cookie-cutter, the lateralizing reamer, and then sequentially reamed and broached.  A trial broach, neck, and head was utilized, and I reduced the hip and it was found to have excellent stability with functional range of motion. The trial components were then removed, and the real polyethylene liner was placed.  The hip was then reduced and taken through functional range of motion and found to have excellent stability. Leg lengths were restored.  The wounds were irrigated with irrisept, and then with normal saline, and vancomycin powder was applied, the fascia closed with Vicryl, and the skin closed with staples.  The wounds were injected. The patient was then awakened and returned to PACU in stable and satisfactory condition. There were no complications.  Marchia Bond, MD Orthopedic Surgeon [phone]   09/15/2020 6:00 PM

## 2020-09-15 NOTE — Transfer of Care (Signed)
Immediate Anesthesia Transfer of Care Note  Patient: [name]  Procedure(s) Performed: TOTAL HIP ARTHROPLASTY WITH CEMENT INTERPOSITION SPACING (Left Hip)  Patient Location: PACU  Anesthesia Type:General  Level of Consciousness: awake, drowsy and patient cooperative  Airway & Oxygen Therapy: Patient Spontanous Breathing and Patient connected to face mask oxygen  Post-op Assessment: Report given to RN and Post -op Vital signs reviewed and stable  Post vital signs: Reviewed and stable  Last Vitals:  Vitals Value Taken Time  BP 95/53 09/15/20 1847  Temp    Pulse 132 09/15/20 1853  Resp 30 09/15/20 1853  SpO2 100 % 09/15/20 1853  Vitals shown include unvalidated device data.  Last Pain:  Vitals:   09/15/20 0859  TempSrc:   PainSc: 5       Patients Stated Pain Goal: 2 (09/14/20 2000)  Complications: No complications documented.

## 2020-09-15 NOTE — Progress Notes (Signed)
Clarified with MD Dion Saucier that pt is to have blood during operative surgery if needed. Notified Dawn in short stay who is aware also.

## 2020-09-15 NOTE — Progress Notes (Signed)
PROGRESS NOTE    PJ ZEHNER  DGU:[id number] DOB: 02-Apr-1986 DOA: 09/13/2020 PCP: [name], PA   Brief Narrative:  HPI per Dr. Cherylann Ratel on 09/13/20 BALDWIN RACICOT is a 35 y.o. male with medical history significant of AVN of hip, HTN, GERD. Presenting with fatigue and left leg pain. Recent admission for COVID (08/22/20). Successfully completed therapy and was discharged on 08/29/20. At that time he was discharged to treatment for alcoholic hepatitis as well. He was ok for a few days after discharge, but he began to feel very tired and weak. He did not try any medicines to help. He tried rest. This progressed over these last 2 weeks. Also during this time, he began having left thigh and leg pain. He reports that the left thigh had become increasingly swollen and tender to touch. He has had a blister rupture on the inner thigh in the groin area. These symptoms increased through 2 days ago when he was no longer ago to walk without great pain. He saw his PCP yesterday who drew labs. He was called this morning by that team and advised to go to the ED immediately. He denies any other aggravating or alleviating factors.   ED Course: He was found to have a Hgb of 6.2. pRBCs were ordered. He was FOBT negative. CT imaging showed left iliopsoas and left gluteal abscesses w/ diffuse swelling and left hip effusion. CCS was consulted. Patient was started on cefazolin and vanc. TRH was called for admission.   **Interim History  Patient now has an MSSA bacteremia so ID was consulted and blood cultures repeated an echocardiogram was obtained.  Because of his abscesses and his iliopsoas muscle and multiple normal abscesses in the legs he underwent 3 drain placement by IR.  Orthopedic surgery is evaluating and planning for surgical intervention 09/15/20.  Blood count is improved after 2 units of PRBCs and he will be getting more units today for his Left Hip Interposition Arthoplasty today.  Blood cultures  are no growth to date so far and he continues to be on IV cefazolin however infectious diseases recommending a TEE so we will contact cardiology to set this up.  Assessment & Plan:   Principal Problem:   MSSA bacteremia Active Problems:   Alcohol dependence ([place])   Alcoholic hepatitis   Sepsis ([place])   Septic arthritis ([place])   Iliopsoas abscess on left ([place])  Iliopsoas and left gluteal abscesses associated with MSSA Bacteremia  Sepsis secondary to above Left hip effusion and septic arthritis in the setting of avascular necrosis -Admitted to Inpatient Telemetry  -Initially started on IV vanc, cefepime; pharmacy to dose but changed to IV Cefazolin by ID -Repeat Blood Cx x2 and obtain ECHO; See below -Initial Blood Cx 09/13/20 showed Staph Aureus in Both sets of Cx's -He was Started on fluids but thye have stopped -Sepsis Criteria met with: tachycardia, tachypnea, lactic acid 3.3, and Leukocytosis was 10.6 source: abscesses as above as well as bacteremia  -PCT was 3.00 -WBC has gone from 10.6 -> 8.3 -> 12.8 -General surgery recs IR involvement for possible abscess drainage and for ortho to review (CCS consulted IR, EDP has consulted ortho); appreciate assistance, will await further recs -Surgery had planned a total hip replacement early March and plan is to currently drain iliopsoas abscess today and regular pulse the patient for a left hip interposition arthroplasty tomorrow with Dr. Mardelle Matte and will continue antibiotics and they will make the patient n.p.o. at midnight today -Interventional  radiology 3 drains in place today via CT-guided placement with a 14 French drainage catheter into the left retroperitoneal abscess into 12 French drains in the medial and lateral abscesses with the anterior aspect of left thigh and there is a total of 1.3 L of purulent fluid that was aspirated following drain placement sent for evaluation -Interventional Radiology evaluated the drains and they are  recommending continue drain management and continue every shift flushes and monitoring of output; he is going to go to the OR today and if the drains are remain and the following plan for this is to repeat a CT scan when his drain output is less than 10 cc/day to assess for possible removal -Drain Gram Stain showing Moderate WBC, Abundant Gram Positive Cocci, and Abundant Gram Negative Rods with Cultures Pending  -Infectious diseases consulted and recommending changing antibiotic to cefazolin and following repeat echocardiogram; ECHO as below did not mention any vegetation -Repeat blood cultures have been done and show NGTD <24 ours and if TTE is negative they can recommend a transesophageal echocardiogram then but since his TTE was a difficult Study, ID Dr. Juleen China recommending TEE given Disseminated MSSA infection so will discuss with Cardiology to do  Symptomatic Anemia Macrocytic Anemia -Presented with Hgb/Hct of 6.2/18.9 -Patient is s/p 2 units pRBCs and his hemoglobin is improving and today it is now 8.0/24.4 but he has more blood understandable given that he is going to go to the OR today -FOBT negative -Anemia panel done and showed an iron level of 44, U IBC 131, TIBC 175, saturation ratios of 25% -check LDH and was 308, haptoglobin pending, iron studies showed an iron level of 44, U IBC 131, TIBC 175, saturation ratios of 25% -Continue to monitor for signs and symptoms of bleeding; currently no overt bleeding noted next- -Pete CBC in a.m.  Hx of Alcoholic Hepatitis Hepatic cirrhosis Hyperbilirubinemia, improving Mildly abnormal AST -Was continiung Prednisolone (he is on 42m through 09/26/20 and then tapers) but given his disseminated MSSA bacteremia will discontinue taper given LFTs being improved and because T Bili Trending down and is gone from 2.3 on admission is now 1.5. I spoke with Dr. KTherisa Doyneof Gastroenterology who feels the patient does not need taper now that this has been  discontinued -Patient and family report he has been alcohol free for 1 month -Continue lactulose 10 g p.o. twice daily, thiamine, magnesium -Patient's ammonia level was 45 yesterday not repeated today but will repeat again in the morning as well as continuing to monitor his PT and INR but this was not done today -AST is slightly elevated at 42 -Continue monitor hepatic function daily and if worsening will call GI for further assistance but at this time will discontinue the prednisone taper  Hx of Tachycardia HTN  -Baseline heart rate is anywhere between 100 110 per family -Continue Carvedilol 3.125 mg p.o. twice daily  GAD -Continue sertraline 25 mg p.o. nightly  GERD -C/w Pantoprazole 40 mg p.o. twice daily  Hyponatremia -Was initiated on IV fluids with normal saline but this is now stopped  -Sodium is trending up and went from 128 and is now 131 but now dropped to 128 again next-continue monitor trend repeat CMP in a.m.  Metabolic Acidosis -Mild -Patient CO2 is now 20, anion gap is 10, chloride level is 98  -IV fluid hydration is now stopped -Continue to monitor and trend and repeat CMP in a.m.  Thrombocytopenia  -Patient's platelet count had dropped from 170 and has trended  down to 140 yesterday and today is 139 -Continue to monitor for signs and symptoms of bleeding; currently no overt bleeding noted -Repeat CBC in a.m.  DVT prophylaxis: SCDs Code Status: FULL CODE  Family Communication: Discussed with family at bedside Disposition Plan: Pending further clinical improvement and evaluation and clearance by specialist  Status is: Inpatient  Remains inpatient appropriate because:Unsafe d/c plan, IV treatments appropriate due to intensity of illness or inability to take PO and Inpatient level of care appropriate due to severity of illness   Dispo: The patient is from: Home              Anticipated d/c is to: TBD              Anticipated d/c date is: 3 days               Patient currently is not medically stable to d/c.   Difficult to place patient No   Consultants:   General Surgery  Orthopedic Surgery  Infectious Diseases  Interventional Radiology  Case was discussed with Gastroenterology    Procedures:  ECHOCARDIOGRAM IMPRESSIONS    1. Left ventricular ejection fraction, by estimation, is 60 to 65%. The  left ventricle has normal function. The left ventricle has no regional  wall motion abnormalities. There is moderate concentric left ventricular  hypertrophy. Left ventricular  diastolic function could not be evaluated. Elevated left ventricular  end-diastolic pressure.  2. Right ventricular systolic function is normal. The right ventricular  size is normal. Tricuspid regurgitation signal is inadequate for assessing  PA pressure.  3. The mitral valve is normal in structure. No evidence of mitral valve  regurgitation. No evidence of mitral stenosis.  4. The aortic valve is normal in structure. Aortic valve regurgitation is  not visualized. No aortic stenosis is present.  5. The inferior vena cava is normal in size with greater than 50%  respiratory variability, suggesting right atrial pressure of 3 mmHg.  6. Trivial pericardial effusion is present. The pericardial effusion is  circumferential.   FINDINGS  Left Ventricle: Left ventricular ejection fraction, by estimation, is 60  to 65%. The left ventricle has normal function. The left ventricle has no  regional wall motion abnormalities. The left ventricular internal cavity  size was normal in size. There is  moderate concentric left ventricular hypertrophy. Left ventricular  diastolic function could not be evaluated. Elevated left ventricular  end-diastolic pressure.   Right Ventricle: The right ventricular size is normal. No increase in  right ventricular wall thickness. Right ventricular systolic function is  normal. Tricuspid regurgitation signal is inadequate for  assessing PA  pressure.   Left Atrium: Left atrial size was normal in size.   Right Atrium: Right atrial size was normal in size.   Pericardium: Trivial pericardial effusion is present. The pericardial  effusion is circumferential.   Mitral Valve: The mitral valve is normal in structure. No evidence of  mitral valve regurgitation. No evidence of mitral valve stenosis.   Tricuspid Valve: The tricuspid valve is normal in structure. Tricuspid  valve regurgitation is mild . No evidence of tricuspid stenosis.   Aortic Valve: The aortic valve is normal in structure. Aortic valve  regurgitation is not visualized. No aortic stenosis is present.   Pulmonic Valve: The pulmonic valve was normal in structure. Pulmonic valve  regurgitation is not visualized. No evidence of pulmonic stenosis.   Aorta: The aortic root is normal in size and structure.   Venous: The inferior vena  cava is normal in size with greater than 50%  respiratory variability, suggesting right atrial pressure of 3 mmHg.   IAS/Shunts: No atrial level shunt detected by color flow Doppler.     LEFT VENTRICLE  PLAX 2D  LVIDd:     4.70 cm   Diastology  LVIDs:     2.80 cm   LV e' medial:  3.81 cm/s  LV PW:     1.50 cm   LV E/e' medial: 31.0  LV IVS:    1.40 cm   LV e' lateral:  6.64 cm/s  LVOT diam:   2.30 cm   LV E/e' lateral: 17.8  LV SV:     78  LV SV Index:  39  LVOT Area:   4.15 cm    LV Volumes (MOD)  LV vol d, MOD A2C: 69.7 ml  LV vol d, MOD A4C: 115.0 ml  LV vol s, MOD A2C: 25.9 ml  LV vol s, MOD A4C: 47.1 ml  LV SV MOD A2C:   43.8 ml  LV SV MOD A4C:   115.0 ml  LV SV MOD BP:   51.8 ml   IVC  IVC diam: 1.50 cm   LEFT ATRIUM      Index    RIGHT ATRIUM      Index  LA diam:   3.80 cm 1.93 cm/m RA Area:   12.30 cm  LA Vol (A2C): 39.5 ml 20.08 ml/m RA Volume:  23.60 ml 12.00 ml/m  LA Vol (A4C): 32.6 ml 16.57 ml/m  AORTIC VALVE   LVOT Vmax:  130.00 cm/s  LVOT Vmean: 86.000 cm/s  LVOT VTI:  0.187 m    AORTA  Ao Root diam: 3.30 cm  Ao Asc diam: 3.20 cm   MITRAL VALVE  MV Area (PHT): 5.84 cm   SHUNTS  MV Decel Time: 130 msec   Systemic VTI: 0.19 m  MV E velocity: 118.00 cm/s Systemic Diam: 2.30 cm   DRAIN PLACEMENT  Septic effusion of the left hip and associated RP and thigh abscesses Post procedural Dx: Same  Technically successful [place] and CT guided placement of a 14 Fr drainage catheter into left retroperitoneal abscess and two 12 Fr drains into medial and lateral abscesses within the anterior aspect of the left thigh.  A total of 1.3 L of purulent fluid was aspirated following drain placement. A representative aspirated sample was capped and sent to the laboratory for analysis.    Antimicrobials:  Anti-infectives (From admission, onward)   Start     Dose/Rate Route Frequency Ordered Stop   09/14/20 0600  ceFAZolin (ANCEF) IVPB 2g/100 mL premix        2 g 200 mL/hr over 30 Minutes Intravenous Every 8 hours 09/14/20 0359     09/13/20 2200  vancomycin (VANCOREADY) IVPB 1500 mg/300 mL  Status:  Discontinued        1,500 mg 150 mL/hr over 120 Minutes Intravenous Every 12 hours 09/13/20 1636 09/14/20 0359   09/13/20 1500  ceFEPIme (MAXIPIME) 2 g in sodium chloride 0.9 % 100 mL IVPB  Status:  Discontinued        2 g 200 mL/hr over 30 Minutes Intravenous Every 8 hours 09/13/20 1437 09/14/20 0359   09/13/20 1130  vancomycin (VANCOREADY) IVPB 1750 mg/350 mL        1,750 mg 175 mL/hr over 120 Minutes Intravenous  Once 09/13/20 1107 09/13/20 1414   09/13/20 1045  ceFAZolin (ANCEF) IVPB 1 g/50 mL premix  1 g 100 mL/hr over 30 Minutes Intravenous  Once 09/13/20 1031 09/13/20 1307        Subjective: Seen and examined at bedside and he thinks his left leg is less swollen today and but continues to have some pain.  Heart rate has been elevated but he states that his baseline heart rate ranges  from 100s to 110s.  No nausea or vomiting.  Ready for surgical intervention today.  No other concerns or complaints at this time  Objective: Vitals:   09/15/20 0222 09/15/20 0300 09/15/20 0529 09/15/20 0900  BP:   123/74 125/77  Pulse:  (!) 119  (!) 120  Resp: 20 (!) 23 (!) 24   Temp:   98.3 F (36.8 C)   TempSrc:   Oral   SpO2:  96%  98%  Weight:      Height:        Intake/Output Summary (Last 24 hours) at 09/15/2020 1054 Last data filed at 09/15/2020 0900 Gross per 24 hour  Intake 680 ml  Output 2185 ml  Net -1505 ml   Filed Weights   09/13/20 0905  Weight: 78.9 kg   Examination: Physical Exam:  Constitutional: WN/WD Caucasian male in NAD appears calm Eyes: Lids and conjunctivae normal, sclerae anicteric  ENMT: External Ears, Nose appear normal. Grossly normal hearing.  Neck: Appears normal, supple, no cervical masses, normal ROM, no appreciable thyromegaly; no JVD Respiratory: Diminished to auscultation bilaterally, no wheezing, rales, rhonchi or crackles. Normal respiratory effort and patient is not tachypenic. No accessory muscle use. Unlabored breathing  Cardiovascular: Tachycardic Rate but regular Rythm, no murmurs / rubs / gallops. S1 and S2 auscultated. 1-2+ LE extremity edema on the Left is much more swollen compared to the right and painful to touch with 3 drains in place.  Abdomen: Soft, non-tender, non-distended.  Bowel sounds positive.  GU: Deferred. Musculoskeletal: No clubbing / cyanosis of digits/nails.  Left leg is much more swollen than the right and has 3 drains in place Skin: No rashes, lesions, ulcers on limited skin evaluation. No induration; Warm and dry.  Neurologic: CN 2-12 grossly intact with no focal deficits. Romberg sign and cerebellar reflexes not assessed.  Psychiatric: Normal judgment and insight. Alert and oriented x 3. Normal mood and appropriate affect.   Data Reviewed: I have personally reviewed following labs and imaging  studies  CBC: Recent Labs  Lab 09/13/20 1045 09/13/20 1821 09/14/20 0525 09/15/20 0450  WBC 10.6*  --  8.3 12.8*  NEUTROABS 8.4*  --   --  10.2*  HGB 6.2* 6.3* 7.6* 8.0*  HCT 18.9* 19.0* 22.6* 24.4*  MCV 106.2*  --  100.9* 103.4*  PLT 170  --  140* 601*   Basic Metabolic Panel: Recent Labs  Lab 09/13/20 1045 09/14/20 0525 09/15/20 0450  NA 128* 131* 128*  K 4.3 3.9 3.8  CL 94* 101 98  CO2 21* 22 20*  GLUCOSE 112* 139* 100*  BUN _0 CREATININE 0.59* 0.44* 0.54*  CALCIUM 8.4* 7.8* 7.8*  MG  --   --  1.8  PHOS  --   --  3.2   GFR: Estimated Creatinine Clearance: 131.8 mL/min (A) (by C-G formula based on SCr of 0.54 mg/dL (L)). Liver Function Tests: Recent Labs  Lab 09/13/20 1045 09/14/20 0525 09/15/20 0450  AST 40 30 42*  ALT 42 32 29  ALKPHOS 156* 130* 191*  BILITOT 2.3* 2.0* 1.5*  PROT 6.7 6.0* 5.8*  ALBUMIN 2.2* 2.0* 1.9*  No results for input(s): LIPASE, AMYLASE in the last 168 hours. Recent Labs  Lab 09/13/20 1520  AMMONIA 45*   Coagulation Profile: Recent Labs  Lab 09/13/20 1045 09/14/20 0525  INR 1.4* 1.5*   Cardiac Enzymes: No results for input(s): CKTOTAL, CKMB, CKMBINDEX, TROPONINI in the last 168 hours. BNP (last 3 results) No results for input(s): PROBNP in the last 8760 hours. HbA1C: No results for input(s): HGBA1C in the last 72 hours. CBG: No results for input(s): GLUCAP in the last 168 hours. Lipid Profile: No results for input(s): CHOL, HDL, LDLCALC, TRIG, CHOLHDL, LDLDIRECT in the last 72 hours. Thyroid Function Tests: No results for input(s): TSH, T4TOTAL, FREET4, T3FREE, THYROIDAB in the last 72 hours. Anemia Panel: Recent Labs    09/13/20 1450  TIBC 175*  IRON 44*   Sepsis Labs: Recent Labs  Lab 09/13/20 1030 09/13/20 1340 09/14/20 0525  PROCALCITON  --   --  3.00  LATICACIDVEN 3.3* 2.5*  --     Recent Results (from the past 240 hour(s))  Culture, blood (routine x 2)     Status: Abnormal (Preliminary  result)   Collection Time: 09/13/20 10:45 AM   Specimen: BLOOD  Result Value Ref Range Status   Specimen Description   Final    BLOOD RIGHT ANTECUBITAL Performed at [facility], [place] [address] [place], [place] [address]    Special Requests   Final    BOTTLES DRAWN AEROBIC AND ANAEROBIC Blood Culture adequate volume Performed at [place] [address], [place], [place] [address]    Culture  Setup Time   Final    IN BOTH AEROBIC AND ANAEROBIC BOTTLES GRAM POSITIVE COCCI CRITICAL RESULT CALLED TO, READ BACK BY AND VERIFIED WITH: E JACKSON PHARMD 09/13/20 0347 JDW    Culture (A)  Final    STAPHYLOCOCCUS AUREUS SUSCEPTIBILITIES TO FOLLOW Performed at [facility], [place] [address], [place], [place] [address]    Report Status PENDING  Incomplete  Blood Culture ID Panel (Reflexed)     Status: Abnormal   Collection Time: 09/13/20 10:45 AM  Result Value Ref Range Status   Enterococcus faecalis NOT DETECTED NOT DETECTED Final   Enterococcus Faecium NOT DETECTED NOT DETECTED Final   Listeria monocytogenes NOT DETECTED NOT DETECTED Final   Staphylococcus species DETECTED (A) NOT DETECTED Final    Comment: CRITICAL RESULT CALLED TO, READ BACK BY AND VERIFIED WITH: E JACKSON PHARMD 09/13/20 0347 JDW    Staphylococcus aureus (BCID) DETECTED (A) NOT DETECTED Final    Comment: CRITICAL RESULT CALLED TO, READ BACK BY AND VERIFIED WITH: E JACKSON PHARMD 09/13/20 0347 JDW    Staphylococcus epidermidis NOT DETECTED NOT DETECTED Final   Staphylococcus lugdunensis NOT DETECTED NOT DETECTED Final   Streptococcus species NOT DETECTED NOT DETECTED Final   Streptococcus agalactiae NOT DETECTED NOT DETECTED Final   Streptococcus pneumoniae NOT DETECTED NOT DETECTED Final   Streptococcus pyogenes NOT DETECTED NOT DETECTED Final   A.calcoaceticus-baumannii NOT DETECTED NOT DETECTED Final   Bacteroides fragilis NOT DETECTED NOT DETECTED Final    Enterobacterales NOT DETECTED NOT DETECTED Final   Enterobacter cloacae complex NOT DETECTED NOT DETECTED Final   Escherichia coli NOT DETECTED NOT DETECTED Final   Klebsiella aerogenes NOT DETECTED NOT DETECTED Final   Klebsiella oxytoca NOT DETECTED NOT DETECTED Final   Klebsiella pneumoniae NOT DETECTED NOT DETECTED Final   Proteus species NOT DETECTED NOT DETECTED Final   Salmonella species NOT DETECTED NOT DETECTED Final  Serratia marcescens NOT DETECTED NOT DETECTED Final   Haemophilus influenzae NOT DETECTED NOT DETECTED Final   Neisseria meningitidis NOT DETECTED NOT DETECTED Final   Pseudomonas aeruginosa NOT DETECTED NOT DETECTED Final   Stenotrophomonas maltophilia NOT DETECTED NOT DETECTED Final   Candida albicans NOT DETECTED NOT DETECTED Final   Candida auris NOT DETECTED NOT DETECTED Final   Candida glabrata NOT DETECTED NOT DETECTED Final   Candida krusei NOT DETECTED NOT DETECTED Final   Candida parapsilosis NOT DETECTED NOT DETECTED Final   Candida tropicalis NOT DETECTED NOT DETECTED Final   Cryptococcus neoformans/gattii NOT DETECTED NOT DETECTED Final   Meth resistant mecA/C and MREJ NOT DETECTED NOT DETECTED Final    Comment: Performed at [facility], [place] [address] [place], [place] [address]  Culture, blood (routine x 2)     Status: Abnormal (Preliminary result)   Collection Time: 09/13/20 11:20 AM   Specimen: BLOOD  Result Value Ref Range Status   Specimen Description   Final    BLOOD RIGHT ANTECUBITAL Performed at [place] [address] [place], [place] [address]    Special Requests   Final    BOTTLES DRAWN AEROBIC AND ANAEROBIC Blood Culture adequate volume Performed at [place] [address], [place], [place] [address]    Culture  Setup Time   Final    IN BOTH AEROBIC AND ANAEROBIC BOTTLES GRAM POSITIVE COCCI CRITICAL VALUE NOTED.  VALUE IS CONSISTENT WITH PREVIOUSLY REPORTED AND CALLED  VALUE. Performed at [facility], [place] [address] [place], [place] [address]    Culture STAPHYLOCOCCUS AUREUS (A)  Final   Report Status PENDING  Incomplete  Resp Panel by RT-PCR (Flu A&B, Covid) Nasopharyngeal Swab     Status: None   Collection Time: 09/13/20  2:01 PM   Specimen: Nasopharyngeal Swab; Nasopharyngeal(NP) swabs in vial transport medium  Result Value Ref Range Status   SARS Coronavirus 2 by RT PCR NEGATIVE NEGATIVE Final    Comment: (NOTE) SARS-CoV-2 target nucleic acids are NOT DETECTED.  The SARS-CoV-2 RNA is generally detectable in upper respiratory specimens during the acute phase of infection. The lowest concentration of SARS-CoV-2 viral copies this assay can detect is 138 copies/mL. A negative result does not preclude SARS-Cov-2 infection and should not be used as the sole basis for treatment or other patient management decisions. A negative result may occur with  improper specimen collection/handling, submission of specimen other than nasopharyngeal swab, presence of viral mutation(s) within the areas targeted by this assay, and inadequate number of viral copies(<138 copies/mL). A negative result must be combined with clinical observations, patient history, and epidemiological information. The expected result is Negative.  Fact Sheet for Patients:  EntrepreneurPulse.com.au  Fact Sheet for Healthcare Providers:  IncredibleEmployment.be  This test is no t yet approved or cleared by the [place] FDA and  has been authorized for detection and/or diagnosis of SARS-CoV-2 by FDA under an Emergency Use Authorization (EUA). This EUA will remain  in effect (meaning this test can be used) for the duration of the COVID-19 declaration under Section 564(b)(1) of the Act, 21 U.S.C.section 360bbb-3(b)(1), unless the authorization is terminated  or revoked sooner.       Influenza A by PCR NEGATIVE NEGATIVE Final   Influenza  B by PCR NEGATIVE NEGATIVE Final    Comment: (NOTE) The Xpert Xpress SARS-CoV-2/FLU/RSV plus assay is intended as an aid in the diagnosis of influenza from Nasopharyngeal swab specimens and should  not be used as a sole basis for treatment. Nasal washings and aspirates are unacceptable for Xpert Xpress SARS-CoV-2/FLU/RSV testing.  Fact Sheet for Patients: EntrepreneurPulse.com.au  Fact Sheet for Healthcare Providers: IncredibleEmployment.be  This test is not yet approved or cleared by the [place] FDA and has been authorized for detection and/or diagnosis of SARS-CoV-2 by FDA under an Emergency Use Authorization (EUA). This EUA will remain in effect (meaning this test can be used) for the duration of the COVID-19 declaration under Section 564(b)(1) of the Act, 21 U.S.C. section 360bbb-3(b)(1), unless the authorization is terminated or revoked.  Performed at [facility], [place] [address], [place], [place] [address]   Culture, blood (routine x 2)     Status: None (Preliminary result)   Collection Time: 09/14/20  9:23 AM   Specimen: BLOOD RIGHT HAND  Result Value Ref Range Status   Specimen Description   Final    BLOOD RIGHT HAND Performed at [place] [address] [place] [place], [place] [id number]    Special Requests   Final    BOTTLES DRAWN AEROBIC AND ANAEROBIC Blood Culture adequate volume Performed at [facility] [address] [place], [place] [address]    Culture   Final    NO GROWTH < 24 HOURS Performed at [facility] [address] [place], [place] [address]    Report Status PENDING  Incomplete  Culture, blood (routine x 2)     Status: None (Preliminary result)   Collection Time: 09/14/20  9:24 AM   Specimen: BLOOD LEFT HAND  Result Value Ref Range Status   Specimen Description   Final    BLOOD LEFT HAND Performed at [facility]  [address], [place], [place] [address]    Special Requests   Final    BOTTLES DRAWN AEROBIC ONLY Blood Culture adequate volume Performed at [facility] [address] [place], [place] [address]    Culture   Final    NO GROWTH < 24 HOURS Performed at [facility] [address] [place], [place] [address]    Report Status PENDING  Incomplete  Aerobic/Anaerobic Culture (surgical/deep wound)     Status: None (Preliminary result)   Collection Time: 09/14/20 12:37 PM   Specimen: Abscess  Result Value Ref Range Status   Specimen Description   Final    ABSCESS  LEFT RP Performed at [facility] [address], [place], [place] [address]    Special Requests   Final    Normal Performed at [facility], [place] [address], [place], [place] [address]    Gram Stain   Final    MODERATE WBC PRESENT, PREDOMINANTLY PMN ABUNDANT GRAM POSITIVE COCCI ABUNDANT GRAM NEGATIVE RODS Performed at [facility], [place] [address] [place], [place] [address]    Culture PENDING  Incomplete   Report Status PENDING  Incomplete  MRSA PCR Screening     Status: None   Collection Time: 09/15/20  3:22 AM   Specimen: Nasal Mucosa; Nasopharyngeal  Result Value Ref Range Status   MRSA by PCR NEGATIVE NEGATIVE Final    Comment:        The GeneXpert MRSA Assay (FDA approved for NASAL specimens only), is one component of a comprehensive MRSA colonization surveillance program. It is not intended to diagnose MRSA infection nor to guide or monitor treatment for MRSA infections. Performed at [facility], [address] [place], [place] [address]      RN Pressure Injury Documentation:     Estimated body mass index is 24.97 kg/m as calculated from the following:   Height as of this encounter: 5' 10" (1.778 m).   Weight as of this encounter: 78.9 kg.  Malnutrition Type:  Nutrition Problem: Increased nutrient  needs Etiology: chronic illness  Malnutrition Characteristics:  Signs/Symptoms: estimated needs  Nutrition Interventions:  Interventions: Ensure Enlive (each supplement provides 350kcal and 20 grams of protein),MVI   Radiology Studies: CT ABDOMEN PELVIS W CONTRAST  Result Date: 09/13/2020 CLINICAL DATA:  Hepatic cirrhosis. EXAM: CT ABDOMEN AND PELVIS WITH CONTRAST TECHNIQUE: Multidetector CT imaging of the abdomen and pelvis was performed using the standard protocol following bolus administration of intravenous contrast. CONTRAST:  163m OMNIPAQUE IOHEXOL 300 MG/ML  SOLN COMPARISON:  None. FINDINGS: Lower chest: No acute abnormality. Hepatobiliary: No gallstones or biliary dilatation is noted. Mildly nodular hepatic contours are noted suggesting hepatic cirrhosis. No definite focal hepatic abnormality is noted. Pancreas: Unremarkable. No pancreatic ductal dilatation or surrounding inflammatory changes. Spleen: Normal in size without focal abnormality. Adrenals/Urinary Tract: Adrenal glands are unremarkable. Kidneys are normal, without renal calculi, focal lesion, or hydronephrosis. Bladder is unremarkable. Stomach/Bowel: Stomach is within normal limits. Appendix appears normal. No evidence of bowel wall thickening, distention, or inflammatory changes. Vascular/Lymphatic: No significant vascular findings are present. No enlarged abdominal or pelvic lymph nodes. Reproductive: Prostate is unremarkable. Other: There is the interval development of fluid collections within the musculature of the left ileo psoas muscle as well as the musculature of the visualized portion of the left gluteal region and proximal left thigh. Diffuse swelling is noted, and these findings are most consistent with innumerable abscesses that have formed within the musculature. Mild subcutaneous edema or inflammation is seen involving the visualized portions of the left upper thigh and hip region. Musculoskeletal: There is  significant destruction of the left femoral head which may be related to chronic septic arthritis or inflammation. Left hip effusion is noted. IMPRESSION: 1. Findings consistent with hepatic cirrhosis. 2. Interval development of innumerable fluid collections are seen within the musculature of the left ileopsoas muscle as well as the musculature of the visualized portion of the left gluteal region and proximal left thigh consistent with innumerable abscesses that have formed within the musculature, several of which are quite large. Diffuse swelling is noted, and there is noted significant destruction of the left femoral head which may be related to chronic septic arthritis or inflammation. Left hip effusion is noted. 3. Mild subcutaneous edema or inflammation is seen involving the visualized portions of the left upper thigh and hip region. Electronically Signed   By: JMarijo ConceptionM.D.   On: 09/13/2020 12:23   ECHOCARDIOGRAM COMPLETE  Result Date: 09/14/2020    ECHOCARDIOGRAM REPORT   Patient Name:   MEASHAN SCHIPANIDate of Exam: 09/14/2020 Medical Rec #:  [id number]        Height:       70.0 in Accession #:    [id number]       Weight:       174.0 lb Date of Birth:  6Dec 08, 1987        BSA:          1.967 m Patient Age:    34 years          BP:           116/82 mmHg Patient Gender: M  HR:           118 bpm. Exam Location:  Inpatient Procedure: 2D Echo, Cardiac Doppler and Color Doppler Indications:    Bacteremia; Z01.818 Encounter for other preprocedural                 examination  History:        Patient has prior history of Echocardiogram examinations, most                 recent 08/25/2020. Abnormal ECG, Signs/Symptoms:Shortness of                 Breath and Dyspnea; Risk Factors:Hypertension. ETOH. Covid                 infection.  Sonographer:    Roseanna Rainbow RDCS Referring Phys: [id number] [facility]  Sonographer Comments: Technically difficult study due to poor echo windows. Patient has  hip infection, in pain, could not move. High fowler's position. IMPRESSIONS  1. Left ventricular ejection fraction, by estimation, is 60 to 65%. The left ventricle has normal function. The left ventricle has no regional wall motion abnormalities. There is moderate concentric left ventricular hypertrophy. Left ventricular diastolic function could not be evaluated. Elevated left ventricular end-diastolic pressure.  2. Right ventricular systolic function is normal. The right ventricular size is normal. Tricuspid regurgitation signal is inadequate for assessing PA pressure.  3. The mitral valve is normal in structure. No evidence of mitral valve regurgitation. No evidence of mitral stenosis.  4. The aortic valve is normal in structure. Aortic valve regurgitation is not visualized. No aortic stenosis is present.  5. The inferior vena cava is normal in size with greater than 50% respiratory variability, suggesting right atrial pressure of 3 mmHg.  6. Trivial pericardial effusion is present. The pericardial effusion is circumferential. FINDINGS  Left Ventricle: Left ventricular ejection fraction, by estimation, is 60 to 65%. The left ventricle has normal function. The left ventricle has no regional wall motion abnormalities. The left ventricular internal cavity size was normal in size. There is  moderate concentric left ventricular hypertrophy. Left ventricular diastolic function could not be evaluated. Elevated left ventricular end-diastolic pressure. Right Ventricle: The right ventricular size is normal. No increase in right ventricular wall thickness. Right ventricular systolic function is normal. Tricuspid regurgitation signal is inadequate for assessing PA pressure. Left Atrium: Left atrial size was normal in size. Right Atrium: Right atrial size was normal in size. Pericardium: Trivial pericardial effusion is present. The pericardial effusion is circumferential. Mitral Valve: The mitral valve is normal in structure.  No evidence of mitral valve regurgitation. No evidence of mitral valve stenosis. Tricuspid Valve: The tricuspid valve is normal in structure. Tricuspid valve regurgitation is mild . No evidence of tricuspid stenosis. Aortic Valve: The aortic valve is normal in structure. Aortic valve regurgitation is not visualized. No aortic stenosis is present. Pulmonic Valve: The pulmonic valve was normal in structure. Pulmonic valve regurgitation is not visualized. No evidence of pulmonic stenosis. Aorta: The aortic root is normal in size and structure. Venous: The inferior vena cava is normal in size with greater than 50% respiratory variability, suggesting right atrial pressure of 3 mmHg. IAS/Shunts: No atrial level shunt detected by color flow Doppler.  LEFT VENTRICLE PLAX 2D LVIDd:         4.70 cm      Diastology LVIDs:         2.80 cm      LV e' medial:  3.81 cm/s LV PW:         1.50 cm      LV E/e' medial:  31.0 LV IVS:        1.40 cm      LV e' lateral:   6.64 cm/s LVOT diam:     2.30 cm      LV E/e' lateral: 17.8 LV SV:         78 LV SV Index:   39 LVOT Area:     4.15 cm  LV Volumes (MOD) LV vol d, MOD A2C: 69.7 ml LV vol d, MOD A4C: 115.0 ml LV vol s, MOD A2C: 25.9 ml LV vol s, MOD A4C: 47.1 ml LV SV MOD A2C:     43.8 ml LV SV MOD A4C:     115.0 ml LV SV MOD BP:      51.8 ml IVC IVC diam: 1.50 cm LEFT ATRIUM           Index       RIGHT ATRIUM           Index LA diam:      3.80 cm 1.93 cm/m  RA Area:     12.30 cm LA Vol (A2C): 39.5 ml 20.08 ml/m RA Volume:   23.60 ml  12.00 ml/m LA Vol (A4C): 32.6 ml 16.57 ml/m  AORTIC VALVE LVOT Vmax:   130.00 cm/s LVOT Vmean:  86.000 cm/s LVOT VTI:    0.187 m  AORTA Ao Root diam: 3.30 cm Ao Asc diam:  3.20 cm MITRAL VALVE MV Area (PHT): 5.84 cm     SHUNTS MV Decel Time: 130 msec     Systemic VTI:  0.19 m MV E velocity: 118.00 cm/s  Systemic Diam: 2.30 cm Fransico Him MD Electronically signed by Fransico Him MD Signature Date/Time: 09/14/2020/4:26:40 PM    Final    CT IMAGE  GUIDED FLUID DRAIN BY CATHETER  Result Date: 09/14/2020 INDICATION: Septic arthritis affecting left hip now with complex retroperitoneal and left thigh collections. Please perform image guided drainage catheter placement for infection source control purposes prior to definitive operative debridement EXAM: ULTRASOUND AND CT-GUIDED PERCUTANEOUS DRAINAGE CATHETER PLACEMENT X3 COMPARISON:  CT abdomen pelvis-09/13/2020 MEDICATIONS: The patient is currently admitted to the hospital and receiving intravenous antibiotics. The antibiotics were administered within an appropriate time frame prior to the initiation of the procedure. ANESTHESIA/SEDATION: Moderate (conscious) sedation was employed during this procedure. A total of Versed 6 mg and Fentanyl 200 mcg was administered intravenously. Moderate Sedation Time: 50 minutes. The patient's level of consciousness and vital signs were monitored continuously by radiology nursing throughout the procedure under my direct supervision. CONTRAST:  None COMPLICATIONS: None immediate. PROCEDURE: Informed written consent was obtained from the patient after a discussion of the risks, benefits and alternatives to treatment. The patient was placed supine on the CT gantry and a pre procedural CT was performed re-demonstrating the known abscess/fluid collection within the left retroperitoneal space as well as ill-defined fluid collections involving the medial and lateral aspects of thigh. The collections were identified sonographically and the procedure was planned. A timeout was performed prior to the initiation of the procedure. The skin overlying the anterior aspect of the left groin and thigh were prepped and draped in the usual sterile fashion. The overlying soft tissues were anesthetized with 1% lidocaine with epinephrine. Under direct ultrasound guidance, the retroperitoneal collection was accessed at the level of the left groin directed in a caudal to cranial trajectory with an  18  gauge trocar needle. Short Amplatz wire was coiled within collection. Next, both the medial and lateral collections within the anterior thigh were also accessed with 18 gauge trocar needles, also in a caudal to cranial trajectory with short Amplatz wires were coiled within both collections. Multiple ultrasound images were saved for procedural documentation purposes. Appropriate position was confirmed with CT imaging Next, tracks were dilated allowing placement of a 14 French drainage catheter within the retroperitoneal collection and 12 French drainage catheters within the medial and lateral anterior thigh collections. Appropriate position was confirmed with CT imaging. After drainage catheter placement, approximately 1.3 L of purulent fluid was aspirated from all collections. A small sample of aspirated fluid was capped and sent to the laboratory for analysis. Postprocedural CT imaging was performed demonstrating significant reduction in size of complex fluid collections (series 6). The drainage catheters were connected to gravity bags and sutured in place. Dressings were applied. The patient tolerated the procedure well without immediate post procedural complication. IMPRESSION: Successful ultrasound and CT guided placement of a 17 French all purpose drain catheter into the retroperitoneal abscess with two 12 [place] all-purpose drainage catheter was placed into the medial and lateral anterior left thigh abscesses. A total of 1.3 L of purulent fluid was aspirated from all collections. A representative sample of aspirated fluid was sent to the laboratory as requested by the ordering clinical team. Electronically Signed   By: Sandi Mariscal M.D.   On: 09/14/2020 13:56   CT IMAGE GUIDED FLUID DRAIN BY CATHETER  Result Date: 09/14/2020 INDICATION: Septic arthritis affecting left hip now with complex retroperitoneal and left thigh collections. Please perform image guided drainage catheter placement for infection source  control purposes prior to definitive operative debridement EXAM: ULTRASOUND AND CT-GUIDED PERCUTANEOUS DRAINAGE CATHETER PLACEMENT X3 COMPARISON:  CT abdomen pelvis-09/13/2020 MEDICATIONS: The patient is currently admitted to the hospital and receiving intravenous antibiotics. The antibiotics were administered within an appropriate time frame prior to the initiation of the procedure. ANESTHESIA/SEDATION: Moderate (conscious) sedation was employed during this procedure. A total of Versed 6 mg and Fentanyl 200 mcg was administered intravenously. Moderate Sedation Time: 50 minutes. The patient's level of consciousness and vital signs were monitored continuously by radiology nursing throughout the procedure under my direct supervision. CONTRAST:  None COMPLICATIONS: None immediate. PROCEDURE: Informed written consent was obtained from the patient after a discussion of the risks, benefits and alternatives to treatment. The patient was placed supine on the CT gantry and a pre procedural CT was performed re-demonstrating the known abscess/fluid collection within the left retroperitoneal space as well as ill-defined fluid collections involving the medial and lateral aspects of thigh. The collections were identified sonographically and the procedure was planned. A timeout was performed prior to the initiation of the procedure. The skin overlying the anterior aspect of the left groin and thigh were prepped and draped in the usual sterile fashion. The overlying soft tissues were anesthetized with 1% lidocaine with epinephrine. Under direct ultrasound guidance, the retroperitoneal collection was accessed at the level of the left groin directed in a caudal to cranial trajectory with an 18 gauge trocar needle. Short Amplatz wire was coiled within collection. Next, both the medial and lateral collections within the anterior thigh were also accessed with 18 gauge trocar needles, also in a caudal to cranial trajectory with short  Amplatz wires were coiled within both collections. Multiple ultrasound images were saved for procedural documentation purposes. Appropriate position was confirmed with CT imaging Next, tracks were dilated  allowing placement of a 14 French drainage catheter within the retroperitoneal collection and 12 French drainage catheters within the medial and lateral anterior thigh collections. Appropriate position was confirmed with CT imaging. After drainage catheter placement, approximately 1.3 L of purulent fluid was aspirated from all collections. A small sample of aspirated fluid was capped and sent to the laboratory for analysis. Postprocedural CT imaging was performed demonstrating significant reduction in size of complex fluid collections (series 6). The drainage catheters were connected to gravity bags and sutured in place. Dressings were applied. The patient tolerated the procedure well without immediate post procedural complication. IMPRESSION: Successful ultrasound and CT guided placement of a 79 French all purpose drain catheter into the retroperitoneal abscess with two 12 [place] all-purpose drainage catheter was placed into the medial and lateral anterior left thigh abscesses. A total of 1.3 L of purulent fluid was aspirated from all collections. A representative sample of aspirated fluid was sent to the laboratory as requested by the ordering clinical team. Electronically Signed   By: Sandi Mariscal M.D.   On: 09/14/2020 13:56   CT IMAGE GUIDED FLUID DRAIN BY CATHETER  Result Date: 09/14/2020 INDICATION: Septic arthritis affecting left hip now with complex retroperitoneal and left thigh collections. Please perform image guided drainage catheter placement for infection source control purposes prior to definitive operative debridement EXAM: ULTRASOUND AND CT-GUIDED PERCUTANEOUS DRAINAGE CATHETER PLACEMENT X3 COMPARISON:  CT abdomen pelvis-09/13/2020 MEDICATIONS: The patient is currently admitted to the hospital  and receiving intravenous antibiotics. The antibiotics were administered within an appropriate time frame prior to the initiation of the procedure. ANESTHESIA/SEDATION: Moderate (conscious) sedation was employed during this procedure. A total of Versed 6 mg and Fentanyl 200 mcg was administered intravenously. Moderate Sedation Time: 50 minutes. The patient's level of consciousness and vital signs were monitored continuously by radiology nursing throughout the procedure under my direct supervision. CONTRAST:  None COMPLICATIONS: None immediate. PROCEDURE: Informed written consent was obtained from the patient after a discussion of the risks, benefits and alternatives to treatment. The patient was placed supine on the CT gantry and a pre procedural CT was performed re-demonstrating the known abscess/fluid collection within the left retroperitoneal space as well as ill-defined fluid collections involving the medial and lateral aspects of thigh. The collections were identified sonographically and the procedure was planned. A timeout was performed prior to the initiation of the procedure. The skin overlying the anterior aspect of the left groin and thigh were prepped and draped in the usual sterile fashion. The overlying soft tissues were anesthetized with 1% lidocaine with epinephrine. Under direct ultrasound guidance, the retroperitoneal collection was accessed at the level of the left groin directed in a caudal to cranial trajectory with an 18 gauge trocar needle. Short Amplatz wire was coiled within collection. Next, both the medial and lateral collections within the anterior thigh were also accessed with 18 gauge trocar needles, also in a caudal to cranial trajectory with short Amplatz wires were coiled within both collections. Multiple ultrasound images were saved for procedural documentation purposes. Appropriate position was confirmed with CT imaging Next, tracks were dilated allowing placement of a 14 French  drainage catheter within the retroperitoneal collection and 12 French drainage catheters within the medial and lateral anterior thigh collections. Appropriate position was confirmed with CT imaging. After drainage catheter placement, approximately 1.3 L of purulent fluid was aspirated from all collections. A small sample of aspirated fluid was capped and sent to the laboratory for analysis. Postprocedural CT imaging was performed  demonstrating significant reduction in size of complex fluid collections (series 6). The drainage catheters were connected to gravity bags and sutured in place. Dressings were applied. The patient tolerated the procedure well without immediate post procedural complication. IMPRESSION: Successful ultrasound and CT guided placement of a 69 French all purpose drain catheter into the retroperitoneal abscess with two 12 [place] all-purpose drainage catheter was placed into the medial and lateral anterior left thigh abscesses. A total of 1.3 L of purulent fluid was aspirated from all collections. A representative sample of aspirated fluid was sent to the laboratory as requested by the ordering clinical team. Electronically Signed   By: Sandi Mariscal M.D.   On: 09/14/2020 13:56   Scheduled Meds: . sodium chloride   Intravenous Once  . acetaminophen  1,000 mg Oral Once  . carvedilol  3.125 mg Oral BID  . docusate sodium  100 mg Oral BID  . feeding supplement  237 mL Oral BID BM  . folic acid  1 mg Oral Daily  . lactulose  10 g Oral BID  . magnesium oxide  400 mg Oral Daily  . multivitamin with minerals  1 tablet Oral Daily  . pantoprazole  40 mg Oral BID  . sertraline  25 mg Oral Daily  . sodium chloride flush  5 mL Intracatheter Q8H  . thiamine  100 mg Oral Daily   Continuous Infusions: .  ceFAZolin (ANCEF) IV 2 g (09/15/20 0526)    LOS: 2 days   Kerney Elbe, DO [facility] PAGER is on [place]  If 7PM-7AM, please contact night-coverage [email or web address]

## 2020-09-15 NOTE — Progress Notes (Signed)
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  Pt reports abstinence from EtOH for 30 days Recent COVID infection: recently admitted and treated with steroids/remdesivir.  COVID PCR negative this admission Anemia: hemoglobin stable this morning after transfusion  PLAN:    Continue Cefazolin TTE was a difficult study.  Recommend TEE given disseminated MSSA infection Follow up repeat cultures.  GNR noted on Gram stain from abscess.  Will monitor but may be covered by Cefazolin Appreciate IR and orthopedics Monitor hemoglobin  SUBJECTIVE:   24 hour events:  Status post IR drain placement x3.  24-hour drain output recorded as 1485 mL Increased leukocytosis IR drain cultures with gram-positive cocci and gram-negative rods on Gram stain Repeat blood cultures no growth to date Transthoracic echo completed.  Technically difficult study.  No vegetations noted  No acute complaints.  Awaiting surgery today.  No fevers, chills.  Tolerating abx thus far.  Does have pain in leg.    OBJECTIVE:   Blood pressure 123/74, pulse (!) 119, temperature 98.3 F (36.8 C), temperature source Oral, resp. rate (!) 24, height  (1.778 m), weight 78.9 kg, SpO2 96 %. Body mass index is 24.97  kg/m.  Physical Exam Constitutional:      General: He is not in acute distress.    Appearance: Normal appearance.  Pulmonary:     Effort: Pulmonary effort is normal. No respiratory distress.  Musculoskeletal:     Right lower leg: Edema present.     Left lower leg: Edema present.     Comments: Left leg with drains in place x 3. Left leg edema > right.  Skin:    General: Skin is warm and dry.     Findings: Erythema present.  Neurological:     General: No focal deficit present.     Mental Status: He is alert and oriented to person, place, and time.  Psychiatric:        Mood and Affect: Mood normal.        Behavior: Behavior normal.      Lab Results: Lab Results  Component Value Date   WBC 12.8 (H) 09/15/2020   HGB 8.0 (L) 09/15/2020   HCT 24.4 (L) 09/15/2020   MCV 103.4 (H) 09/15/2020   PLT 139 (L) 09/15/2020    Lab Results  Component Value Date   NA 128 (L) 09/15/2020   K 3.8 09/15/2020   CO2 20 (L) 09/15/2020   GLUCOSE 100 (H) 09/15/2020   BUN 12 09/15/2020   CREATININE 0.54 (L) 09/15/2020   CALCIUM 7.8 (L) 09/15/2020   GFRNONAA >60 09/15/2020   GFRAA >60 02/24/2019    Lab Results  Component Value Date   ALT 29 09/15/2020   AST 42 (H) 09/15/2020   ALKPHOS 191 (H) 09/15/2020  BILITOT 1.5 (H) 09/15/2020       Component Value Date/Time   CRP 12.0 (H) 08/27/2020 0454    No results found for: ESRSEDRATE   I have reviewed the micro and lab results in Epic.  Imaging: DG Chest 1 View  Result Date: 09/13/2020 CLINICAL DATA:  Leg swelling EXAM: CHEST  1 VIEW COMPARISON:  08/25/2020. FINDINGS: Mild hypoinflation. No pneumothorax, pleural effusion or focal consolidation. Cardiomediastinal silhouette within normal limits. No acute osseous abnormality. IMPRESSION: No focal airspace disease. Electronically Signed   By: Stana Bunting M.D.   On: 09/13/2020 10:59   CT ABDOMEN PELVIS W CONTRAST  Result Date: 09/13/2020 CLINICAL DATA:  Hepatic cirrhosis.  EXAM: CT ABDOMEN AND PELVIS WITH CONTRAST TECHNIQUE: Multidetector CT imaging of the abdomen and pelvis was performed using the standard protocol following bolus administration of intravenous contrast. CONTRAST:  OMNIPAQUE IOHEXOL 300 MG/ML  SOLN COMPARISON:  None. FINDINGS: Lower chest: No acute abnormality. Hepatobiliary: No gallstones or biliary dilatation is noted. Mildly nodular hepatic contours are noted suggesting hepatic cirrhosis. No definite focal hepatic abnormality is noted. Pancreas: Unremarkable. No pancreatic ductal dilatation or surrounding inflammatory changes. Spleen: Normal in size without focal abnormality. Adrenals/Urinary Tract: Adrenal glands are unremarkable. Kidneys are normal, without renal calculi, focal lesion, or hydronephrosis. Bladder is unremarkable. Stomach/Bowel: Stomach is within normal limits. Appendix appears normal. No evidence of bowel wall thickening, distention, or inflammatory changes. Vascular/Lymphatic: No significant vascular findings are present. No enlarged abdominal or pelvic lymph nodes. Reproductive: Prostate is unremarkable. Other: There is the interval development of fluid collections within the musculature of the left ileo psoas muscle as well as the musculature of the visualized portion of the left gluteal region and proximal left thigh. Diffuse swelling is noted, and these findings are most consistent with innumerable abscesses that have formed within the musculature. Mild subcutaneous edema or inflammation is seen involving the visualized portions of the left upper thigh and hip region. Musculoskeletal: There is significant destruction of the left femoral head which may be related to chronic septic arthritis or inflammation. Left hip effusion is noted. IMPRESSION: 1. Findings consistent with hepatic cirrhosis. 2. Interval development of innumerable fluid collections are seen within the musculature of the left ileopsoas muscle as well as the musculature of  the visualized portion of the left gluteal region and proximal left thigh consistent with innumerable abscesses that have formed within the musculature, several of which are quite large. Diffuse swelling is noted, and there is noted significant destruction of the left femoral head which may be related to chronic septic arthritis or inflammation. Left hip effusion is noted. 3. Mild subcutaneous edema or inflammation is seen involving the visualized portions of the left upper thigh and hip region. Electronically Signed   By: Lupita Raider M.D.   On: 09/13/2020 12:23   ECHOCARDIOGRAM COMPLETE  Result Date: 09/14/2020    ECHOCARDIOGRAM REPORT   Patient Name:   [name] Date of Exam: 09/14/2020 Medical Rec #:  [id number]         Height:       70.0 in Accession #:    [id number]        Weight:       174.0 lb Date of Birth:  [birth year]         BSA:          1.967 m Patient Age:    34 years          BP:  116/82 mmHg Patient Gender: M                 HR:           118 bpm. Exam Location:  Inpatient Procedure: 2D Echo, Cardiac Doppler and Color Doppler Indications:    Bacteremia; Z01.818 Encounter for other preprocedural                 examination  History:        Patient has prior history of Echocardiogram examinations, most                 recent 08/25/2020. Abnormal ECG, Signs/Symptoms:Shortness of                 Breath and Dyspnea; Risk Factors:Hypertension. ETOH. Covid                 infection.  Sonographer:    Sheralyn Boatman RDCS Referring Phys: [id number] [facility] LATIF [facility]  Sonographer Comments: Technically difficult study due to poor echo windows. Patient has hip infection, in pain, could not move. High fowler's position. IMPRESSIONS  1. Left ventricular ejection fraction, by estimation, is 60 to 65%. The left ventricle has normal function. The left ventricle has no regional wall motion abnormalities. There is moderate concentric left ventricular hypertrophy. Left ventricular diastolic function could  not be evaluated. Elevated left ventricular end-diastolic pressure.  2. Right ventricular systolic function is normal. The right ventricular size is normal. Tricuspid regurgitation signal is inadequate for assessing PA pressure.  3. The mitral valve is normal in structure. No evidence of mitral valve regurgitation. No evidence of mitral stenosis.  4. The aortic valve is normal in structure. Aortic valve regurgitation is not visualized. No aortic stenosis is present.  5. The inferior vena cava is normal in size with greater than 50% respiratory variability, suggesting right atrial pressure of 3 mmHg.  6. Trivial pericardial effusion is present. The pericardial effusion is circumferential. FINDINGS  Left Ventricle: Left ventricular ejection fraction, by estimation, is 60 to 65%. The left ventricle has normal function. The left ventricle has no regional wall motion abnormalities. The left ventricular internal cavity size was normal in size. There is  moderate concentric left ventricular hypertrophy. Left ventricular diastolic function could not be evaluated. Elevated left ventricular end-diastolic pressure. Right Ventricle: The right ventricular size is normal. No increase in right ventricular wall thickness. Right ventricular systolic function is normal. Tricuspid regurgitation signal is inadequate for assessing PA pressure. Left Atrium: Left atrial size was normal in size. Right Atrium: Right atrial size was normal in size. Pericardium: Trivial pericardial effusion is present. The pericardial effusion is circumferential. Mitral Valve: The mitral valve is normal in structure. No evidence of mitral valve regurgitation. No evidence of mitral valve stenosis. Tricuspid Valve: The tricuspid valve is normal in structure. Tricuspid valve regurgitation is mild . No evidence of tricuspid stenosis. Aortic Valve: The aortic valve is normal in structure. Aortic valve regurgitation is not visualized. No aortic stenosis is present.  Pulmonic Valve: The pulmonic valve was normal in structure. Pulmonic valve regurgitation is not visualized. No evidence of pulmonic stenosis. Aorta: The aortic root is normal in size and structure. Venous: The inferior vena cava is normal in size with greater than 50% respiratory variability, suggesting right atrial pressure of 3 mmHg. IAS/Shunts: No atrial level shunt detected by color flow Doppler.  LEFT VENTRICLE PLAX 2D LVIDd:         4.70 cm  Diastology LVIDs:         2.80 cm      LV e' medial:    3.81 cm/s LV PW:         1.50 cm      LV E/e' medial:  31.0 LV IVS:        1.40 cm      LV e' lateral:   6.64 cm/s LVOT diam:     2.30 cm      LV E/e' lateral: 17.8 LV SV:         78 LV SV Index:   39 LVOT Area:     4.15 cm  LV Volumes (MOD) LV vol d, MOD A2C: 69.7 ml LV vol d, MOD A4C: 115.0 ml LV vol s, MOD A2C: 25.9 ml LV vol s, MOD A4C: 47.1 ml LV SV MOD A2C:     43.8 ml LV SV MOD A4C:     115.0 ml LV SV MOD BP:      51.8 ml IVC IVC diam: 1.50 cm LEFT ATRIUM           Index       RIGHT ATRIUM           Index LA diam:      3.80 cm 1.93 cm/m  RA Area:     12.30 cm LA Vol (A2C): 39.5 ml 20.08 ml/m RA Volume:   23.60 ml  12.00 ml/m LA Vol (A4C): 32.6 ml 16.57 ml/m  AORTIC VALVE LVOT Vmax:   130.00 cm/s LVOT Vmean:  86.000 cm/s LVOT VTI:    0.187 m  AORTA Ao Root diam: 3.30 cm Ao Asc diam:  3.20 cm MITRAL VALVE MV Area (PHT): 5.84 cm     SHUNTS MV Decel Time: 130 msec     Systemic VTI:  0.19 m MV E velocity: 118.00 cm/s  Systemic Diam: 2.30 cm Armanda Magic MD Electronically signed by Armanda Magic MD Signature Date/Time: 09/14/2020/4:26:40 PM    Final    CT IMAGE GUIDED FLUID DRAIN BY CATHETER  Result Date: 09/14/2020 INDICATION: Septic arthritis affecting left hip now with complex retroperitoneal and left thigh collections. Please perform image guided drainage catheter placement for infection source control purposes prior to definitive operative debridement EXAM: ULTRASOUND AND CT-GUIDED PERCUTANEOUS  DRAINAGE CATHETER PLACEMENT X3 COMPARISON:  CT abdomen pelvis-09/13/2020 MEDICATIONS: The patient is currently admitted to the hospital and receiving intravenous antibiotics. The antibiotics were administered within an appropriate time frame prior to the initiation of the procedure. ANESTHESIA/SEDATION: Moderate (conscious) sedation was employed during this procedure. A total of Versed 6 mg and Fentanyl 200 mcg was administered intravenously. Moderate Sedation Time: 50 minutes. The patient's level of consciousness and vital signs were monitored continuously by radiology nursing throughout the procedure under my direct supervision. CONTRAST:  None COMPLICATIONS: None immediate. PROCEDURE: Informed written consent was obtained from the patient after a discussion of the risks, benefits and alternatives to treatment. The patient was placed supine on the CT gantry and a pre procedural CT was performed re-demonstrating the known abscess/fluid collection within the left retroperitoneal space as well as ill-defined fluid collections involving the medial and lateral aspects of thigh. The collections were identified sonographically and the procedure was planned. A timeout was performed prior to the initiation of the procedure. The skin overlying the anterior aspect of the left groin and thigh were prepped and draped in the usual sterile fashion. The overlying soft tissues were anesthetized with 1% lidocaine with epinephrine. Under direct  ultrasound guidance, the retroperitoneal collection was accessed at the level of the left groin directed in a caudal to cranial trajectory with an 18 gauge trocar needle. Short Amplatz wire was coiled within collection. Next, both the medial and lateral collections within the anterior thigh were also accessed with 18 gauge trocar needles, also in a caudal to cranial trajectory with short Amplatz wires were coiled within both collections. Multiple ultrasound images were saved for procedural  documentation purposes. Appropriate position was confirmed with CT imaging Next, tracks were dilated allowing placement of a 14 French drainage catheter within the retroperitoneal collection and 12 French drainage catheters within the medial and lateral anterior thigh collections. Appropriate position was confirmed with CT imaging. After drainage catheter placement, approximately 1.3 L of purulent fluid was aspirated from all collections. A small sample of aspirated fluid was capped and sent to the laboratory for analysis. Postprocedural CT imaging was performed demonstrating significant reduction in size of complex fluid collections (series 6). The drainage catheters were connected to gravity bags and sutured in place. Dressings were applied. The patient tolerated the procedure well without immediate post procedural complication. IMPRESSION: Successful ultrasound and CT guided placement of a 7 French all purpose drain catheter into the retroperitoneal abscess with two 12 [place] all-purpose drainage catheter was placed into the medial and lateral anterior left thigh abscesses. A total of 1.3 L of purulent fluid was aspirated from all collections. A representative sample of aspirated fluid was sent to the laboratory as requested by the ordering clinical team. Electronically Signed   By: Simonne Come M.D.   On: 09/14/2020 13:56   CT IMAGE GUIDED FLUID DRAIN BY CATHETER  Result Date: 09/14/2020 INDICATION: Septic arthritis affecting left hip now with complex retroperitoneal and left thigh collections. Please perform image guided drainage catheter placement for infection source control purposes prior to definitive operative debridement EXAM: ULTRASOUND AND CT-GUIDED PERCUTANEOUS DRAINAGE CATHETER PLACEMENT X3 COMPARISON:  CT abdomen pelvis-09/13/2020 MEDICATIONS: The patient is currently admitted to the hospital and receiving intravenous antibiotics. The antibiotics were administered within an appropriate time frame  prior to the initiation of the procedure. ANESTHESIA/SEDATION: Moderate (conscious) sedation was employed during this procedure. A total of Versed 6 mg and Fentanyl 200 mcg was administered intravenously. Moderate Sedation Time: 50 minutes. The patient's level of consciousness and vital signs were monitored continuously by radiology nursing throughout the procedure under my direct supervision. CONTRAST:  None COMPLICATIONS: None immediate. PROCEDURE: Informed written consent was obtained from the patient after a discussion of the risks, benefits and alternatives to treatment. The patient was placed supine on the CT gantry and a pre procedural CT was performed re-demonstrating the known abscess/fluid collection within the left retroperitoneal space as well as ill-defined fluid collections involving the medial and lateral aspects of thigh. The collections were identified sonographically and the procedure was planned. A timeout was performed prior to the initiation of the procedure. The skin overlying the anterior aspect of the left groin and thigh were prepped and draped in the usual sterile fashion. The overlying soft tissues were anesthetized with 1% lidocaine with epinephrine. Under direct ultrasound guidance, the retroperitoneal collection was accessed at the level of the left groin directed in a caudal to cranial trajectory with an 18 gauge trocar needle. Short Amplatz wire was coiled within collection. Next, both the medial and lateral collections within the anterior thigh were also accessed with 18 gauge trocar needles, also in a caudal to cranial trajectory with short Amplatz wires were coiled within  both collections. Multiple ultrasound images were saved for procedural documentation purposes. Appropriate position was confirmed with CT imaging Next, tracks were dilated allowing placement of a 14 French drainage catheter within the retroperitoneal collection and 12 French drainage catheters within the medial  and lateral anterior thigh collections. Appropriate position was confirmed with CT imaging. After drainage catheter placement, approximately 1.3 L of purulent fluid was aspirated from all collections. A small sample of aspirated fluid was capped and sent to the laboratory for analysis. Postprocedural CT imaging was performed demonstrating significant reduction in size of complex fluid collections (series 6). The drainage catheters were connected to gravity bags and sutured in place. Dressings were applied. The patient tolerated the procedure well without immediate post procedural complication. IMPRESSION: Successful ultrasound and CT guided placement of a 48 French all purpose drain catheter into the retroperitoneal abscess with two 12 [place] all-purpose drainage catheter was placed into the medial and lateral anterior left thigh abscesses. A total of 1.3 L of purulent fluid was aspirated from all collections. A representative sample of aspirated fluid was sent to the laboratory as requested by the ordering clinical team. Electronically Signed   By: Simonne Come M.D.   On: 09/14/2020 13:56   CT IMAGE GUIDED FLUID DRAIN BY CATHETER  Result Date: 09/14/2020 INDICATION: Septic arthritis affecting left hip now with complex retroperitoneal and left thigh collections. Please perform image guided drainage catheter placement for infection source control purposes prior to definitive operative debridement EXAM: ULTRASOUND AND CT-GUIDED PERCUTANEOUS DRAINAGE CATHETER PLACEMENT X3 COMPARISON:  CT abdomen pelvis-09/13/2020 MEDICATIONS: The patient is currently admitted to the hospital and receiving intravenous antibiotics. The antibiotics were administered within an appropriate time frame prior to the initiation of the procedure. ANESTHESIA/SEDATION: Moderate (conscious) sedation was employed during this procedure. A total of Versed 6 mg and Fentanyl 200 mcg was administered intravenously. Moderate Sedation Time: 50 minutes.  The patient's level of consciousness and vital signs were monitored continuously by radiology nursing throughout the procedure under my direct supervision. CONTRAST:  None COMPLICATIONS: None immediate. PROCEDURE: Informed written consent was obtained from the patient after a discussion of the risks, benefits and alternatives to treatment. The patient was placed supine on the CT gantry and a pre procedural CT was performed re-demonstrating the known abscess/fluid collection within the left retroperitoneal space as well as ill-defined fluid collections involving the medial and lateral aspects of thigh. The collections were identified sonographically and the procedure was planned. A timeout was performed prior to the initiation of the procedure. The skin overlying the anterior aspect of the left groin and thigh were prepped and draped in the usual sterile fashion. The overlying soft tissues were anesthetized with 1% lidocaine with epinephrine. Under direct ultrasound guidance, the retroperitoneal collection was accessed at the level of the left groin directed in a caudal to cranial trajectory with an 18 gauge trocar needle. Short Amplatz wire was coiled within collection. Next, both the medial and lateral collections within the anterior thigh were also accessed with 18 gauge trocar needles, also in a caudal to cranial trajectory with short Amplatz wires were coiled within both collections. Multiple ultrasound images were saved for procedural documentation purposes. Appropriate position was confirmed with CT imaging Next, tracks were dilated allowing placement of a 14 French drainage catheter within the retroperitoneal collection and 12 French drainage catheters within the medial and lateral anterior thigh collections. Appropriate position was confirmed with CT imaging. After drainage catheter placement, approximately 1.3 L of purulent fluid was aspirated from  all collections. A small sample of aspirated fluid was  capped and sent to the laboratory for analysis. Postprocedural CT imaging was performed demonstrating significant reduction in size of complex fluid collections (series 6). The drainage catheters were connected to gravity bags and sutured in place. Dressings were applied. The patient tolerated the procedure well without immediate post procedural complication. IMPRESSION: Successful ultrasound and CT guided placement of a 3814 French all purpose drain catheter into the retroperitoneal abscess with two 12 [place] all-purpose drainage catheter was placed into the medial and lateral anterior left thigh abscesses. A total of 1.3 L of purulent fluid was aspirated from all collections. A representative sample of aspirated fluid was sent to the laboratory as requested by the ordering clinical team. Electronically Signed   By: Simonne ComeJohn  Watts M.D.   On: 09/14/2020 13:56     Imaging independently reviewed in Epic.    Vedia CofferAndrew N Andersson [facility] [phone] pager 09/15/2020, 8:42 AM

## 2020-09-15 NOTE — Progress Notes (Signed)
Coordinated his care with his primary orthopedic surgeon, Dr. Christell Constant, as well as with interventional radiology, and general surgery, and we are going to plan to proceed with a left hip cemented antibiotic impregnated interposition.  We will also perform an incision and drainage of any deep purulent pockets found in the posterior lateral approach.  The risks benefits and alternatives were discussed with the patient including but not limited to the risks of nonoperative treatment, versus surgical intervention including infection, bleeding, nerve injury, periprosthetic fracture, the need for revision surgery, dislocation, leg length discrepancy, blood clots, cardiopulmonary complications, morbidity, mortality, among others, and they were willing to proceed.  He will need likely at least 1 more operation down the line to convert him to a standard total hip replacement, assuming that we are able to gain control of his life-threatening infectious process.  George Lucy, MD

## 2020-09-15 NOTE — Progress Notes (Signed)
eLink Physician-Brief Progress Note Patient Name: [name] DOB: [birth year] MRN: [id number]   Date of Service  09/15/2020  HPI/Events of Note  Patient is post-op left hip arthroplasty for osteomyelitis and extensive debridement of a left hip deep abscess and cellulitis, he has known MSSA bacteremia for which he is on Ancef. He was transferred to the ICU for post-operative hypotension r/o evolving septic shock.  eICU Interventions  New Patient Evaluation completed.        Thomasene Lot [name] 09/15/2020, 9:15 PM

## 2020-09-15 NOTE — Progress Notes (Signed)
Blood consent placed in pt's chart. Will transfuse first unit when blood is ready.  Pt in no distress at this time. Will continue to monitor.

## 2020-09-15 NOTE — Progress Notes (Signed)
Referring Physician(s): Merlene LaughterSheikh, Omair Latif [facility])  Supervising Physician: Richarda OverlieHenn, Adam  [facility]  [facility]  Chief Complaint: "Thigh pain"  Subjective:  History of left hip septic arthritis complicated by development of left retroperitoneal and left thigh fluid collections concerning for abscesses s/p drain placement x3 (left retroperitoneal, medial left thigh, lateral left thigh) in IR 09/14/2020. Patient awake and alert laying in bed. Complains of left thigh pain, stable since admission. Girlfriend at bedside. Left retroperitoneal, medial left thigh, and lateral left thigh drain sites c/d/i.   Allergies: Cashew nut (anacardium occidentale) skin test and Cashew nut oil  Medications: Prior to Admission medications   Medication Sig Start Date End Date Taking? Authorizing Provider  carvedilol (COREG) 3.125 MG tablet Take 3.125 mg by mouth 2 (two) times daily. 07/28/20  Yes [provider]  folic acid (FOLVITE) 1 MG tablet Take 1 tablet (1 mg total) by mouth daily. 08/30/20  Yes Pokhrel, Laxman, MD  guaiFENesin-dextromethorphan (ROBITUSSIN DM) 100-10 MG/5ML syrup Take 5 mLs by mouth every 4 (four) hours as needed for cough (chest congestion). Patient taking differently: Take 10 mLs by mouth every 4 (four) hours as needed for cough (chest congestion). 08/29/20  Yes Pokhrel, Laxman, MD  lactulose (CHRONULAC) 10 GM/15ML solution Take 15 mLs (10 g total) by mouth 2 (two) times daily. 08/29/20  Yes Pokhrel, Laxman, MD  magnesium oxide (MAG-OX) 400 MG tablet Take 1 tablet by mouth daily. 07/28/20  Yes [provider]  pantoprazole (PROTONIX) 40 MG tablet Take 1 tablet (40 mg total) by mouth 2 (two) times daily before a meal for 28 days, THEN 1 tablet (40 mg total) daily for 28 days. 08/29/20 10/24/20 Yes Pokhrel, Laxman, MD  prednisoLONE (ORAPRED) 15 MG/5ML solution Take 1.6-13.3 mLs by mouth See admin instructions. Take 13.513ml by mouth daily for 28 days, 10ml for 4 days, 6.496ml  daily for 4 days, 3.3 daily for 4 days, then 1.436ml daily for 3 days. 08/29/20  Yes [provider]  sertraline (ZOLOFT) 25 MG tablet Take 25 mg by mouth at bedtime. 08/20/20  Yes [provider]  thiamine 100 MG tablet Take 1 tablet (100 mg total) by mouth daily. 08/30/20  Yes Pokhrel, Laxman, MD  buPROPion (WELLBUTRIN XL) 300 MG 24 hr tablet Take 1 tablet (300 mg total) by mouth daily. Patient not taking: No sig reported 10/16/13   Rachael FeeLugo, Irving A, MD  carbamazepine (TEGRETOL XR) 200 MG 12 hr tablet Take 1 tablet (200 mg total) by mouth at bedtime. Patient not taking: No sig reported 10/16/13   Rachael FeeLugo, Irving A, MD  hydrOXYzine (ATARAX/VISTARIL) 25 MG tablet Take 1 tablet (25 mg total) by mouth every 6 (six) hours as needed for anxiety (or CIWA score </= 10). Patient not taking: No sig reported 10/16/13   Rachael FeeLugo, Irving A, MD  lisinopril-hydrochlorothiazide (ZESTORETIC) 20-25 MG tablet Take 1 tablet by mouth daily. Patient not taking: No sig reported 08/31/20   Pokhrel, Rebekah ChesterfieldLaxman, MD  oxyCODONE (ROXICODONE) 5 MG immediate release tablet Take 1 tablet (5 mg total) by mouth every 8 (eight) hours as needed. Patient not taking: No sig reported 08/29/20 08/29/21  Pokhrel, Rebekah ChesterfieldLaxman, MD  prednisoLONE 5 MG TABS tablet Take 8 tablets (40 mg total) by mouth daily for 28 days, THEN 6 tablets (30 mg total) daily for 4 days, THEN 4 tablets (20 mg total) daily for 4 days, THEN 2 tablets (10 mg total) daily for 4 days, THEN 1 tablet (5 mg total) every 3 (  three) days for 1 day. Patient not taking: Reported on 09/13/2020 08/29/20 10/09/20  Joycelyn Das, MD  traMADol (ULTRAM) 50 MG tablet Take 1 tablet (50 mg total) by mouth every 6 (six) hours as needed. Patient not taking: Reported on 09/13/2020 08/29/20   Joycelyn Das, MD     Vital Signs: BP 125/77 (BP Location: Right Arm)   Pulse (!) 120   Temp 98.3 F (36.8 C) (Oral)   Resp (!) 24   Ht  (1.778 m)   Wt 174 lb (78.9 kg)   SpO2 98%   BMI 24.97 kg/m    Physical Exam Vitals and nursing note reviewed.  Constitutional:      General: He is not in acute distress. Pulmonary:     Effort: Pulmonary effort is normal. No respiratory distress.  Musculoskeletal:     Comments: Right thigh/crease of groin with erythema. Left retroperitoneal, left medial thigh, and left lateral thigh drain sites without bleeding or active drainage. Left retroperitoneal drain with approximately 25 cc light pink purulent output in gravity bag.. Left medial thigh drain with minimal output of light pink purulent output in gravity bag. Left lateral thigh drain with approximately 75cc light pink purulent output in gravity bag.  Skin:    General: Skin is warm and dry.  Neurological:     Mental Status: He is alert and oriented to person, place, and time.     Imaging: DG Chest 1 View  Result Date: 09/13/2020 CLINICAL DATA:  Leg swelling EXAM: CHEST  1 VIEW COMPARISON:  08/25/2020. FINDINGS: Mild hypoinflation. No pneumothorax, pleural effusion or focal consolidation. Cardiomediastinal silhouette within normal limits. No acute osseous abnormality. IMPRESSION: No focal airspace disease. Electronically Signed   By: Stana Bunting M.D.   On: 09/13/2020 10:59   CT ABDOMEN PELVIS W CONTRAST  Result Date: 09/13/2020 CLINICAL DATA:  Hepatic cirrhosis. EXAM: CT ABDOMEN AND PELVIS WITH CONTRAST TECHNIQUE: Multidetector CT imaging of the abdomen and pelvis was performed using the standard protocol following bolus administration of intravenous contrast. CONTRAST:  OMNIPAQUE IOHEXOL 300 MG/ML  SOLN COMPARISON:  None. FINDINGS: Lower chest: No acute abnormality. Hepatobiliary: No gallstones or biliary dilatation is noted. Mildly nodular hepatic contours are noted suggesting hepatic cirrhosis. No definite focal hepatic abnormality is noted. Pancreas: Unremarkable. No pancreatic ductal dilatation or surrounding inflammatory changes. Spleen: Normal in size without focal  abnormality. Adrenals/Urinary Tract: Adrenal glands are unremarkable. Kidneys are normal, without renal calculi, focal lesion, or hydronephrosis. Bladder is unremarkable. Stomach/Bowel: Stomach is within normal limits. Appendix appears normal. No evidence of bowel wall thickening, distention, or inflammatory changes. Vascular/Lymphatic: No significant vascular findings are present. No enlarged abdominal or pelvic lymph nodes. Reproductive: Prostate is unremarkable. Other: There is the interval development of fluid collections within the musculature of the left ileo psoas muscle as well as the musculature of the visualized portion of the left gluteal region and proximal left thigh. Diffuse swelling is noted, and these findings are most consistent with innumerable abscesses that have formed within the musculature. Mild subcutaneous edema or inflammation is seen involving the visualized portions of the left upper thigh and hip region. Musculoskeletal: There is significant destruction of the left femoral head which may be related to chronic septic arthritis or inflammation. Left hip effusion is noted. IMPRESSION: 1. Findings consistent with hepatic cirrhosis. 2. Interval development of innumerable fluid collections are seen within the musculature of the left ileopsoas muscle as well as the musculature of the visualized portion of the left gluteal  region and proximal left thigh consistent with innumerable abscesses that have formed within the musculature, several of which are quite large. Diffuse swelling is noted, and there is noted significant destruction of the left femoral head which may be related to chronic septic arthritis or inflammation. Left hip effusion is noted. 3. Mild subcutaneous edema or inflammation is seen involving the visualized portions of the left upper thigh and hip region. Electronically Signed   By: Lupita Raider M.D.   On: 09/13/2020 12:23   ECHOCARDIOGRAM COMPLETE  Result Date:  09/14/2020    ECHOCARDIOGRAM REPORT   Patient Name:   [name] Date of Exam: 09/14/2020 Medical Rec #:  [id number]         Height:       70.0 in Accession #:    [id number]        Weight:       174.0 lb Date of Birth:  1986-06-13         BSA:          1.967 m Patient Age:    34 years          BP:           116/82 mmHg Patient Gender: M                 HR:           118 bpm. Exam Location:  Inpatient Procedure: 2D Echo, Cardiac Doppler and Color Doppler Indications:    Bacteremia; Z01.818 Encounter for other preprocedural                 examination  History:        Patient has prior history of Echocardiogram examinations, most                 recent 08/25/2020. Abnormal ECG, Signs/Symptoms:Shortness of                 Breath and Dyspnea; Risk Factors:Hypertension. ETOH. Covid                 infection.  Sonographer:    Sheralyn Boatman RDCS Referring Phys: [id number] [facility] LATIF [facility]  Sonographer Comments: Technically difficult study due to poor echo windows. Patient has hip infection, in pain, could not move. High fowler's position. IMPRESSIONS  1. Left ventricular ejection fraction, by estimation, is 60 to 65%. The left ventricle has normal function. The left ventricle has no regional wall motion abnormalities. There is moderate concentric left ventricular hypertrophy. Left ventricular diastolic function could not be evaluated. Elevated left ventricular end-diastolic pressure.  2. Right ventricular systolic function is normal. The right ventricular size is normal. Tricuspid regurgitation signal is inadequate for assessing PA pressure.  3. The mitral valve is normal in structure. No evidence of mitral valve regurgitation. No evidence of mitral stenosis.  4. The aortic valve is normal in structure. Aortic valve regurgitation is not visualized. No aortic stenosis is present.  5. The inferior vena cava is normal in size with greater than 50% respiratory variability, suggesting right atrial pressure of 3 mmHg.  6. Trivial  pericardial effusion is present. The pericardial effusion is circumferential. FINDINGS  Left Ventricle: Left ventricular ejection fraction, by estimation, is 60 to 65%. The left ventricle has normal function. The left ventricle has no regional wall motion abnormalities. The left ventricular internal cavity size was normal in size. There is  moderate concentric left ventricular hypertrophy. Left ventricular diastolic function could not be evaluated.  Elevated left ventricular end-diastolic pressure. Right Ventricle: The right ventricular size is normal. No increase in right ventricular wall thickness. Right ventricular systolic function is normal. Tricuspid regurgitation signal is inadequate for assessing PA pressure. Left Atrium: Left atrial size was normal in size. Right Atrium: Right atrial size was normal in size. Pericardium: Trivial pericardial effusion is present. The pericardial effusion is circumferential. Mitral Valve: The mitral valve is normal in structure. No evidence of mitral valve regurgitation. No evidence of mitral valve stenosis. Tricuspid Valve: The tricuspid valve is normal in structure. Tricuspid valve regurgitation is mild . No evidence of tricuspid stenosis. Aortic Valve: The aortic valve is normal in structure. Aortic valve regurgitation is not visualized. No aortic stenosis is present. Pulmonic Valve: The pulmonic valve was normal in structure. Pulmonic valve regurgitation is not visualized. No evidence of pulmonic stenosis. Aorta: The aortic root is normal in size and structure. Venous: The inferior vena cava is normal in size with greater than 50% respiratory variability, suggesting right atrial pressure of 3 mmHg. IAS/Shunts: No atrial level shunt detected by color flow Doppler.  LEFT VENTRICLE PLAX 2D LVIDd:         4.70 cm      Diastology LVIDs:         2.80 cm      LV e' medial:    3.81 cm/s LV PW:         1.50 cm      LV E/e' medial:  31.0 LV IVS:        1.40 cm      LV e' lateral:    6.64 cm/s LVOT diam:     2.30 cm      LV E/e' lateral: 17.8 LV SV:         78 LV SV Index:   39 LVOT Area:     4.15 cm  LV Volumes (MOD) LV vol d, MOD A2C: 69.7 ml LV vol d, MOD A4C: 115.0 ml LV vol s, MOD A2C: 25.9 ml LV vol s, MOD A4C: 47.1 ml LV SV MOD A2C:     43.8 ml LV SV MOD A4C:     115.0 ml LV SV MOD BP:      51.8 ml IVC IVC diam: 1.50 cm LEFT ATRIUM           Index       RIGHT ATRIUM           Index LA diam:      3.80 cm 1.93 cm/m  RA Area:     12.30 cm LA Vol (A2C): 39.5 ml 20.08 ml/m RA Volume:   23.60 ml  12.00 ml/m LA Vol (A4C): 32.6 ml 16.57 ml/m  AORTIC VALVE LVOT Vmax:   130.00 cm/s LVOT Vmean:  86.000 cm/s LVOT VTI:    0.187 m  AORTA Ao Root diam: 3.30 cm Ao Asc diam:  3.20 cm MITRAL VALVE MV Area (PHT): 5.84 cm     SHUNTS MV Decel Time: 130 msec     Systemic VTI:  0.19 m MV E velocity: 118.00 cm/s  Systemic Diam: 2.30 cm Armanda Magic MD Electronically signed by Armanda Magic MD Signature Date/Time: 09/14/2020/4:26:40 PM    Final    CT IMAGE GUIDED FLUID DRAIN BY CATHETER  Result Date: 09/14/2020 INDICATION: Septic arthritis affecting left hip now with complex retroperitoneal and left thigh collections. Please perform image guided drainage catheter placement for infection source control purposes prior to definitive operative debridement EXAM: ULTRASOUND AND CT-GUIDED  PERCUTANEOUS DRAINAGE CATHETER PLACEMENT X3 COMPARISON:  CT abdomen pelvis-09/13/2020 MEDICATIONS: The patient is currently admitted to the hospital and receiving intravenous antibiotics. The antibiotics were administered within an appropriate time frame prior to the initiation of the procedure. ANESTHESIA/SEDATION: Moderate (conscious) sedation was employed during this procedure. A total of Versed 6 mg and Fentanyl 200 mcg was administered intravenously. Moderate Sedation Time: 50 minutes. The patient's level of consciousness and vital signs were monitored continuously by radiology nursing throughout the procedure under my  direct supervision. CONTRAST:  None COMPLICATIONS: None immediate. PROCEDURE: Informed written consent was obtained from the patient after a discussion of the risks, benefits and alternatives to treatment. The patient was placed supine on the CT gantry and a pre procedural CT was performed re-demonstrating the known abscess/fluid collection within the left retroperitoneal space as well as ill-defined fluid collections involving the medial and lateral aspects of thigh. The collections were identified sonographically and the procedure was planned. A timeout was performed prior to the initiation of the procedure. The skin overlying the anterior aspect of the left groin and thigh were prepped and draped in the usual sterile fashion. The overlying soft tissues were anesthetized with 1% lidocaine with epinephrine. Under direct ultrasound guidance, the retroperitoneal collection was accessed at the level of the left groin directed in a caudal to cranial trajectory with an 18 gauge trocar needle. Short Amplatz wire was coiled within collection. Next, both the medial and lateral collections within the anterior thigh were also accessed with 18 gauge trocar needles, also in a caudal to cranial trajectory with short Amplatz wires were coiled within both collections. Multiple ultrasound images were saved for procedural documentation purposes. Appropriate position was confirmed with CT imaging Next, tracks were dilated allowing placement of a 14 French drainage catheter within the retroperitoneal collection and 12 French drainage catheters within the medial and lateral anterior thigh collections. Appropriate position was confirmed with CT imaging. After drainage catheter placement, approximately 1.3 L of purulent fluid was aspirated from all collections. A small sample of aspirated fluid was capped and sent to the laboratory for analysis. Postprocedural CT imaging was performed demonstrating significant reduction in size of  complex fluid collections (series 6). The drainage catheters were connected to gravity bags and sutured in place. Dressings were applied. The patient tolerated the procedure well without immediate post procedural complication. IMPRESSION: Successful ultrasound and CT guided placement of a 56 French all purpose drain catheter into the retroperitoneal abscess with two 12 [place] all-purpose drainage catheter was placed into the medial and lateral anterior left thigh abscesses. A total of 1.3 L of purulent fluid was aspirated from all collections. A representative sample of aspirated fluid was sent to the laboratory as requested by the ordering clinical team. Electronically Signed   By: Simonne Come M.D.   On: 09/14/2020 13:56   CT IMAGE GUIDED FLUID DRAIN BY CATHETER  Result Date: 09/14/2020 INDICATION: Septic arthritis affecting left hip now with complex retroperitoneal and left thigh collections. Please perform image guided drainage catheter placement for infection source control purposes prior to definitive operative debridement EXAM: ULTRASOUND AND CT-GUIDED PERCUTANEOUS DRAINAGE CATHETER PLACEMENT X3 COMPARISON:  CT abdomen pelvis-09/13/2020 MEDICATIONS: The patient is currently admitted to the hospital and receiving intravenous antibiotics. The antibiotics were administered within an appropriate time frame prior to the initiation of the procedure. ANESTHESIA/SEDATION: Moderate (conscious) sedation was employed during this procedure. A total of Versed 6 mg and Fentanyl 200 mcg was administered intravenously. Moderate Sedation Time: 50 minutes. The  patient's level of consciousness and vital signs were monitored continuously by radiology nursing throughout the procedure under my direct supervision. CONTRAST:  None COMPLICATIONS: None immediate. PROCEDURE: Informed written consent was obtained from the patient after a discussion of the risks, benefits and alternatives to treatment. The patient was placed supine on  the CT gantry and a pre procedural CT was performed re-demonstrating the known abscess/fluid collection within the left retroperitoneal space as well as ill-defined fluid collections involving the medial and lateral aspects of thigh. The collections were identified sonographically and the procedure was planned. A timeout was performed prior to the initiation of the procedure. The skin overlying the anterior aspect of the left groin and thigh were prepped and draped in the usual sterile fashion. The overlying soft tissues were anesthetized with 1% lidocaine with epinephrine. Under direct ultrasound guidance, the retroperitoneal collection was accessed at the level of the left groin directed in a caudal to cranial trajectory with an 18 gauge trocar needle. Short Amplatz wire was coiled within collection. Next, both the medial and lateral collections within the anterior thigh were also accessed with 18 gauge trocar needles, also in a caudal to cranial trajectory with short Amplatz wires were coiled within both collections. Multiple ultrasound images were saved for procedural documentation purposes. Appropriate position was confirmed with CT imaging Next, tracks were dilated allowing placement of a 14 French drainage catheter within the retroperitoneal collection and 12 French drainage catheters within the medial and lateral anterior thigh collections. Appropriate position was confirmed with CT imaging. After drainage catheter placement, approximately 1.3 L of purulent fluid was aspirated from all collections. A small sample of aspirated fluid was capped and sent to the laboratory for analysis. Postprocedural CT imaging was performed demonstrating significant reduction in size of complex fluid collections (series 6). The drainage catheters were connected to gravity bags and sutured in place. Dressings were applied. The patient tolerated the procedure well without immediate post procedural complication. IMPRESSION:  Successful ultrasound and CT guided placement of a 57 French all purpose drain catheter into the retroperitoneal abscess with two 12 [place] all-purpose drainage catheter was placed into the medial and lateral anterior left thigh abscesses. A total of 1.3 L of purulent fluid was aspirated from all collections. A representative sample of aspirated fluid was sent to the laboratory as requested by the ordering clinical team. Electronically Signed   By: Simonne Come M.D.   On: 09/14/2020 13:56   CT IMAGE GUIDED FLUID DRAIN BY CATHETER  Result Date: 09/14/2020 INDICATION: Septic arthritis affecting left hip now with complex retroperitoneal and left thigh collections. Please perform image guided drainage catheter placement for infection source control purposes prior to definitive operative debridement EXAM: ULTRASOUND AND CT-GUIDED PERCUTANEOUS DRAINAGE CATHETER PLACEMENT X3 COMPARISON:  CT abdomen pelvis-09/13/2020 MEDICATIONS: The patient is currently admitted to the hospital and receiving intravenous antibiotics. The antibiotics were administered within an appropriate time frame prior to the initiation of the procedure. ANESTHESIA/SEDATION: Moderate (conscious) sedation was employed during this procedure. A total of Versed 6 mg and Fentanyl 200 mcg was administered intravenously. Moderate Sedation Time: 50 minutes. The patient's level of consciousness and vital signs were monitored continuously by radiology nursing throughout the procedure under my direct supervision. CONTRAST:  None COMPLICATIONS: None immediate. PROCEDURE: Informed written consent was obtained from the patient after a discussion of the risks, benefits and alternatives to treatment. The patient was placed supine on the CT gantry and a pre procedural CT was performed re-demonstrating the known abscess/fluid collection  within the left retroperitoneal space as well as ill-defined fluid collections involving the medial and lateral aspects of thigh. The  collections were identified sonographically and the procedure was planned. A timeout was performed prior to the initiation of the procedure. The skin overlying the anterior aspect of the left groin and thigh were prepped and draped in the usual sterile fashion. The overlying soft tissues were anesthetized with 1% lidocaine with epinephrine. Under direct ultrasound guidance, the retroperitoneal collection was accessed at the level of the left groin directed in a caudal to cranial trajectory with an 18 gauge trocar needle. Short Amplatz wire was coiled within collection. Next, both the medial and lateral collections within the anterior thigh were also accessed with 18 gauge trocar needles, also in a caudal to cranial trajectory with short Amplatz wires were coiled within both collections. Multiple ultrasound images were saved for procedural documentation purposes. Appropriate position was confirmed with CT imaging Next, tracks were dilated allowing placement of a 14 French drainage catheter within the retroperitoneal collection and 12 French drainage catheters within the medial and lateral anterior thigh collections. Appropriate position was confirmed with CT imaging. After drainage catheter placement, approximately 1.3 L of purulent fluid was aspirated from all collections. A small sample of aspirated fluid was capped and sent to the laboratory for analysis. Postprocedural CT imaging was performed demonstrating significant reduction in size of complex fluid collections (series 6). The drainage catheters were connected to gravity bags and sutured in place. Dressings were applied. The patient tolerated the procedure well without immediate post procedural complication. IMPRESSION: Successful ultrasound and CT guided placement of a 73 French all purpose drain catheter into the retroperitoneal abscess with two 12 [place] all-purpose drainage catheter was placed into the medial and lateral anterior left thigh abscesses. A  total of 1.3 L of purulent fluid was aspirated from all collections. A representative sample of aspirated fluid was sent to the laboratory as requested by the ordering clinical team. Electronically Signed   By: Simonne Come M.D.   On: 09/14/2020 13:56    Labs:  CBC: Recent Labs    08/29/20 0322 09/13/20 1045 09/13/20 1821 09/14/20 0525 09/15/20 0450  WBC 17.6* 10.6*  --  8.3 12.8*  HGB 9.4* 6.2* 6.3* 7.6* 8.0*  HCT 26.3* 18.9* 19.0* 22.6* 24.4*  PLT 112* 170  --  140* 139*    COAGS: Recent Labs    08/22/20 1817 08/25/20 0349 08/27/20 0454 08/29/20 0322 09/13/20 1045 09/14/20 0525  INR 1.4*   < > 1.9* 1.8* 1.4* 1.5*  APTT 42*  --   --   --   --   --    < > = values in this interval not displayed.    BMP: Recent Labs    08/29/20 0322 09/13/20 1045 09/14/20 0525 09/15/20 0450  NA 135 128* 131* 128*  K 4.3 4.3 3.9 3.8  CL 101 94* 101 98  CO2 22 21* 22 20*  GLUCOSE 174* 112* 139* 100*  BUN 45* 17 16 12   CALCIUM 8.0* 8.4* 7.8* 7.8*  CREATININE 0.99 0.59* 0.44* 0.54*  GFRNONAA >60 >60 >60 >60    LIVER FUNCTION TESTS: Recent Labs    08/29/20 0322 09/13/20 1045 09/14/20 0525 09/15/20 0450  BILITOT 5.3* 2.3* 2.0* 1.5*  AST 128* 40 30 42*  ALT 115* 42 32 29  ALKPHOS 244* 156* 130* 191*  PROT 6.4* 6.7 6.0* 5.8*  ALBUMIN 2.9* 2.2* 2.0* 1.9*    Assessment and Plan:  History of  left hip septic arthritis complicated by development of left retroperitoneal and left thigh fluid collections concerning for abscesses s/p drain placement x3 (left retroperitoneal, medial left thigh, lateral left thigh) in IR 09/14/2020. Left retroperitoneal drain stable with approximately 25 cc light pink purulent output in gravity bag (additional 360 cc output from drain in past 24 hours). Left medial thigh drain stable with minimal output of light pink purulent output in gravity bag (additional 975 cc output from drain in past 24 hours). Left lateral thigh drain stable with  approximately 75cc light pink purulent output in gravity bag (additional 150 cc output from drain in past 24 hours). Continue current drain management- continue Qshift flushes/monitor of output. To OR today for hip replacement- if drains are to remain following this plan for repeat CT when output <10 cc/day (assess for possible removal). Further plans per TRH/orthopedic surgery/ID- appreciate and agree with management. IR to follow.   Electronically Signed: Elwin Mocha, PA-C 09/15/2020, 10:55 AM   I spent a total of 15 Minutes at the the patient's bedside AND on the patient's hospital floor or unit, greater than 50% of which was counseling/coordinating care for left retroperitoneal and left thigh fluid collections s/p drain placement x3.

## 2020-09-15 NOTE — Consult Note (Signed)
NAME:  [name], MRN:  [id number], DOB:  February 19, 1986, LOS: 2 ADMISSION DATE:  09/13/2020, CONSULTATION DATE:  09/15/20 REFERRING MD:  Teryl Lucy, CHIEF COMPLAINT:  Hypotension   Brief History:  [name] a 35 year old malewith medical history significant ofAVN of hip, HTN, GERD. Presented with fatigue and left leg pain on 09/13/20. He had recent admission for COVID (08/22/20) and alcoholic hepatitis where he was on prednisolone therapy with plan to continue it through early March. He was discharged home on 08/29/20.   The left leg pain had progressed over a 2 week period of time. He was found to have left iliopsoas and left gluteal abscesses w/diffuse swelling and left hip effusion. He is also noted to have MSSA bacteremia and is being followed by ID and is on cefazolin. TTE is negative for vegetations and he is planned for a TEE.   Interventional radiology 3 drains in place on 09/14/20 via CT-guided placement with a 14 French drainage catheter into the left retroperitoneal abscess into 12 French drains in the medial and lateral abscesses with the anterior aspect of left thigh. He initially had 1.3L drained and per report has drained another 2L today.   He went to the OR for left hip arthroplasty with antibiotic spacer placement and debridement. Post-operatively he has hypotension despite 2 units PRBCS, 2.2 liters of crystalloid, and of albumin.   PCCM was called to transfer the patient to the ICU.  Past Medical History:  Alcohol Abuse Cirrhosis Avascular Necrosis of hip Asthma Depression Hypertension  Significant Hospital Events:  2/23 - IR placed 3 percutaneous drains 2/24 - left total hip arthorplasty with antibiotic spacers placed, abscess debridement 2/24 - Transferred to the ICU for hypotension  Consults:  PCCM Orthopedic Surgery Infectious Disease  Procedures:  2/23 - IR placed 3 percutaneous drains 2/24 - left total hip arthorplasty with antibiotic  spacers placed, abscess debridement  Significant Diagnostic Tests:  CT Abdomen/Pelvis 09/13/20 1. Findings consistent with hepatic cirrhosis. 2. Interval development of innumerable fluid collections are seen within the musculature of the left ileopsoas muscle as well as the musculature of the visualized portion of the left gluteal region and proximal left thigh consistent with innumerable abscesses that have formed within the musculature, several of which are quite large. Diffuse swelling is noted, and there is noted significant destruction of the left femoral head which may be related to chronic septic arthritis or inflammation. Left hip effusion is noted. 3. Mild subcutaneous edema or inflammation is seen involving the visualized portions of the left upper thigh and hip region.  TTE 09/14/20 1. Left ventricular ejection fraction, by estimation, is 60 to 65%. The  left ventricle has normal function. The left ventricle has no regional  wall motion abnormalities. There is moderate concentric left ventricular  hypertrophy. Left ventricular  diastolic function could not be evaluated. Elevated left ventricular  end-diastolic pressure.  2. Right ventricular systolic function is normal. The right ventricular  size is normal. Tricuspid regurgitation signal is inadequate for assessing  PA pressure.  3. The mitral valve is normal in structure. No evidence of mitral valve  regurgitation. No evidence of mitral stenosis.  4. The aortic valve is normal in structure. Aortic valve regurgitation is  not visualized. No aortic stenosis is present.  5. The inferior vena cava is normal in size with greater than 50%  respiratory variability, suggesting right atrial pressure of 3 mmHg.  6. Trivial pericardial effusion is present. The pericardial effusion  is  Circumferential.  Micro Data:  2/22 Bld Cx - MSSA 2/23 Bld Cx >> 2/23 Abscess drainage Culture >> 2/24 MRSA Screen  Negative  Antimicrobials:  Vancomycin 2/22 & 2/24 Cefepime 2/22  Tobramycin 2/24  Cefazolin 2/22 >>  Interim History / Subjective:  Transferred to the ICU. No acute events.   Objective   Blood pressure (!) 78/44, pulse (!) 118, temperature 97.8 F (36.6 C), resp. rate (!) 23, height 5\' 10"  (1.778 m), weight 78.9 kg, SpO2 100 %.        Intake/Output Summary (Last 24 hours) at 09/15/2020 2001 Last data filed at 09/15/2020 1921 Gross per 24 hour  Intake 4020.06 ml  Output 2235 ml  Net 1785.06 ml   Filed Weights   09/13/20 0905  Weight: 78.9 kg    Examination: General: No acute distress, resting in bed, alert, pale appearing HENT: /AT, Sclera anicteric, moist mucous membranes Lungs: Clear to auscultation, no wheezing Cardiovascular: tachycardic, s1s2, no murmurs Abdomen: soft, non-tender, non-distended, decreased bowel sounds Extremities: left lower extremity edema. 3 drains around left hip. Neuro: alert, oriented, moving all extremities GU: foley in place  Resolved Hospital Problem list     Assessment & Plan:  Septic Shock In setting of MSSA Bacteremia, septic left hip joint, multiple left hip abscesses - Transition from peripheral neo to peripheral levophed - Give 1L NS now, continue LR at 7175mL/hr - Check random cortisol level, may need stress dose steroids as he was on prolonged course of steroids for alcoholic hepatitis.  - On Cefazolin per ID  Left hip Septic Arthritis, Left Iliopsoas abscess, Left gluteal abscsesses S/p left hip arthoropathy with antibiotic spacer placement 2/24 and 3 percutaneous drains placed by IR in the iliopsoas and gluteal abscesses  - Cefazolin per ID - follow up ortho recommendations - follow up CT when perc drains have less than 10mL output per day  MSSA Bacteremia - Cefazolin per ID - Plan for TEE in coming days - Repeat blood cultures on 2/23 are negative to day  Acute Blood Loss Anemia Macrocytic  Anemia Thrombocytopenia - received 2 units PRBCs today - Trend H/H - Transfuse to maintain hemoglobin >7g/dL - Monitor platelets  Hepatic Cirrhosis Hx of Alcoholic Hepatitis - continue daily lactulose  Hyponatremia Likely hypovolemic - Continue IV fluids as above  Metabolic Acidosis In setting of sepsis - Fluids as above  Hx of hypertension - hold antihypertensives at this time  GERD - pantoprazole 40mg  daily  GAD - sertraline 25mg  qHS  Best practice (evaluated daily)  Diet: Ice chips Pain/Anxiety/Delirium protocol (if indicated): dilaudid PRN VAP protocol (if indicated): n/a DVT prophylaxis: SCDs GI prophylaxis: PPI Glucose control: n/a Mobility: bed rest Disposition:ICU  Goals of Care:  Last date of multidisciplinary goals of care discussion: Family and staff present:  Summary of discussion:  Follow up goals of care discussion due:  Code Status: Full Code  Labs   CBC: Recent Labs  Lab 09/13/20 1045 09/13/20 1821 09/14/20 0525 09/15/20 0450 09/15/20 1745  WBC 10.6*  --  8.3 12.8*  --   NEUTROABS 8.4*  --   --  10.2*  --   HGB 6.2* 6.3* 7.6* 8.0* 8.8*  HCT 18.9* 19.0* 22.6* 24.4* 26.0*  MCV 106.2*  --  100.9* 103.4*  --   PLT 170  --  140* 139*  --     Basic Metabolic Panel: Recent Labs  Lab 09/13/20 1045 09/14/20 0525 09/15/20 0450 09/15/20 1745  NA 128* 131* 128* 130*  K 4.3 3.9 3.8 4.5  CL 94* 101 98  --   CO2 21* 22 20*  --   GLUCOSE 112* 139* 100*  --   BUN 17 16 12   --   CREATININE 0.59* 0.44* 0.54*  --   CALCIUM 8.4* 7.8* 7.8*  --   MG  --   --  1.8  --   PHOS  --   --  3.2  --    GFR: Estimated Creatinine Clearance: 131.8 mL/min (A) (by C-G formula based on SCr of 0.54 mg/dL (L)). Recent Labs  Lab 09/13/20 1030 09/13/20 1045 09/13/20 1340 09/14/20 0525 09/15/20 0450  PROCALCITON  --   --   --  3.00  --   WBC  --  10.6*  --  8.3 12.8*  LATICACIDVEN 3.3*  --  2.5*  --   --     Liver Function Tests: Recent Labs  Lab  09/13/20 1045 09/14/20 0525 09/15/20 0450  AST 40 30 42*  ALT 42 32 29  ALKPHOS 156* 130* 191*  BILITOT 2.3* 2.0* 1.5*  PROT 6.7 6.0* 5.8*  ALBUMIN 2.2* 2.0* 1.9*   No results for input(s): LIPASE, AMYLASE in the last 168 hours. Recent Labs  Lab 09/13/20 1520  AMMONIA 45*    ABG    Component Value Date/Time   HCO3 22.4 09/15/2020 1745   TCO2 24 09/15/2020 1745   ACIDBASEDEF 6.0 (H) 09/15/2020 1745   O2SAT 64.0 09/15/2020 1745     Coagulation Profile: Recent Labs  Lab 09/13/20 1045 09/14/20 0525  INR 1.4* 1.5*    Cardiac Enzymes: No results for input(s): CKTOTAL, CKMB, CKMBINDEX, TROPONINI in the last 168 hours.  HbA1C: No results found for: HGBA1C  CBG: No results for input(s): GLUCAP in the last 168 hours.  Review of Systems:   A 10 point review of systems is otherwise negative unless stated above.  Past Medical History:  He,  has a past medical history of Alcohol abuse, Asthma, Depression, and Hypertension.   Surgical History:   Past Surgical History:  Procedure Laterality Date  . FRACTURE SURGERY       Social History:   reports that he has been smoking cigars. He has never used smokeless tobacco. He reports current alcohol use of about 12.0 - 18.0 standard drinks of alcohol per week. He reports current drug use. Drug: Marijuana.   Family History:  His family history is negative for Cirrhosis and Diabetes Mellitus II.   Allergies Allergies  Allergen Reactions  . Cashew Nut (Anacardium Occidentale) Skin Test Nausea And Vomiting    Allergy to cashews only; Tolerates other nuts  . Cashew Nut Oil Nausea And Vomiting    Allergy to cashews only; Tolerates other nuts     Home Medications  Prior to Admission medications   Medication Sig Start Date End Date Taking? Authorizing Provider  carvedilol (COREG) 3.125 MG tablet Take 3.125 mg by mouth 2 (two) times daily. 07/28/20  Yes [provider]  folic acid (FOLVITE) 1 MG tablet Take 1 tablet  (1 mg total) by mouth daily. 08/30/20  Yes Pokhrel, Laxman, MD  guaiFENesin-dextromethorphan (ROBITUSSIN DM) 100-10 MG/5ML syrup Take 5 mLs by mouth every 4 (four) hours as needed for cough (chest congestion). Patient taking differently: Take 10 mLs by mouth every 4 (four) hours as needed for cough (chest congestion). 08/29/20  Yes Pokhrel, Laxman, MD  lactulose (CHRONULAC) 10 GM/15ML solution Take 15 mLs (10 g total) by mouth 2 (two) times daily.  08/29/20  Yes Pokhrel, Laxman, MD  magnesium oxide (MAG-OX) 400 MG tablet Take 1 tablet by mouth daily. 07/28/20  Yes [provider]  pantoprazole (PROTONIX) 40 MG tablet Take 1 tablet (40 mg total) by mouth 2 (two) times daily before a meal for 28 days, THEN 1 tablet (40 mg total) daily for 28 days. 08/29/20 10/24/20 Yes Pokhrel, Laxman, MD  prednisoLONE (ORAPRED) 15 MG/5ML solution Take 1.6-13.3 mLs by mouth See admin instructions. Take 13.37ml by mouth daily for 28 days, 77ml for 4 days, 6.83ml daily for 4 days, 3.3 daily for 4 days, then 1.19ml daily for 3 days. 08/29/20  Yes [provider]  sertraline (ZOLOFT) 25 MG tablet Take 25 mg by mouth at bedtime. 08/20/20  Yes [provider]  thiamine 100 MG tablet Take 1 tablet (100 mg total) by mouth daily. 08/30/20  Yes Pokhrel, Laxman, MD  buPROPion (WELLBUTRIN XL) 300 MG 24 hr tablet Take 1 tablet (300 mg total) by mouth daily. Patient not taking: No sig reported 10/16/13   Rachael Fee, MD  carbamazepine (TEGRETOL XR) 200 MG 12 hr tablet Take 1 tablet (200 mg total) by mouth at bedtime. Patient not taking: No sig reported 10/16/13   Rachael Fee, MD  hydrOXYzine (ATARAX/VISTARIL) 25 MG tablet Take 1 tablet (25 mg total) by mouth every 6 (six) hours as needed for anxiety (or CIWA score </= 10). Patient not taking: No sig reported 10/16/13   Rachael Fee, MD  lisinopril-hydrochlorothiazide (ZESTORETIC) 20-25 MG tablet Take 1 tablet by mouth daily. Patient not taking: No sig reported 08/31/20    Pokhrel, Rebekah [place], MD  oxyCODONE (ROXICODONE) 5 MG immediate release tablet Take 1 tablet (5 mg total) by mouth every 8 (eight) hours as needed. Patient not taking: No sig reported 08/29/20 08/29/21  Pokhrel, Rebekah [place], MD  prednisoLONE 5 MG TABS tablet Take 8 tablets (40 mg total) by mouth daily for 28 days, THEN 6 tablets (30 mg total) daily for 4 days, THEN 4 tablets (20 mg total) daily for 4 days, THEN 2 tablets (10 mg total) daily for 4 days, THEN 1 tablet (5 mg total) every 3 (three) days for 1 day. Patient not taking: Reported on 09/13/2020 08/29/20 10/09/20  Joycelyn Das, MD  traMADol (ULTRAM) 50 MG tablet Take 1 tablet (50 mg total) by mouth every 6 (six) hours as needed. Patient not taking: Reported on 09/13/2020 08/29/20   Joycelyn Das, MD     Critical care time: 45 minutes     Melody Comas, MD [facility] Office: [phone]   See Amion for Pager Details

## 2020-09-15 NOTE — Anesthesia Postprocedure Evaluation (Signed)
Anesthesia Post Note  Patient: [name]  Procedure(s) Performed: TOTAL HIP ARTHROPLASTY WITH CEMENT INTERPOSITION SPACING (Left Hip)     Patient location during evaluation: SICU Anesthesia Type: General Level of consciousness: awake Pain management: pain level controlled Vital Signs Assessment: post-procedure vital signs reviewed and stable Respiratory status: spontaneous breathing Cardiovascular status: stable and tachycardic Postop Assessment: no apparent nausea or vomiting Anesthetic complications: no Comments: Pt on Norepi drip to ICU after septic hip draianage   No complications documented.  Last Vitals:  Vitals:   09/15/20 2038 09/15/20 2111  BP: (!) 99/46   Pulse: (!) 115   Resp: (!) 24   Temp: 36.6 C 36.4 C  SpO2: 100%     Last Pain:  Vitals:   09/15/20 2111  TempSrc: Oral  PainSc:                  Trevor Iha

## 2020-09-15 NOTE — Anesthesia Preprocedure Evaluation (Addendum)
Anesthesia Evaluation  Patient identified by MRN, date of birth, ID band Patient awake  General Assessment Comment:1. MSSA bacteremia: Complicated by iliopsoas/gluteal/thigh abscesses status post IR drain placement 07/14/2021 and left hip septic arthritis in the setting of avascular necrosis. 2. Alcoholic hepatitis: Currently on prolonged steroid taper with normalization of liver enzymes 3. Alcoholic cirrhosis: Patient reports abstinence for the past 30 days 4. Recent COVID-19 infection: Recent admission treated with remdesivir and steroids.  Covid PCR this admission negative   Reviewed: Allergy & Precautions, H&P , NPO status , Patient's Chart, lab work & pertinent test results  Airway Mallampati: II  TM Distance: >3 FB Neck ROM: Full    Dental no notable dental hx. (+) Teeth Intact, Dental Advisory Given   Pulmonary Current Smoker,    Pulmonary exam normal breath sounds clear to auscultation       Cardiovascular hypertension, Normal cardiovascular exam Rhythm:Regular Rate:Normal     Neuro/Psych Anxiety Depression negative neurological ROS     GI/Hepatic negative GI ROS, (+) Cirrhosis     substance abuse  alcohol use,   Endo/Other  negative endocrine ROS  Renal/GU negative Renal ROS  negative genitourinary   Musculoskeletal negative musculoskeletal ROS (+)   Abdominal   Peds negative pediatric ROS (+)  Hematology  (+) anemia ,   Anesthesia Other Findings   Reproductive/Obstetrics negative OB ROS                            Anesthesia Physical Anesthesia Plan  ASA: III and emergent  Anesthesia Plan: General   Post-op Pain Management:    Induction: Intravenous  PONV Risk Score and Plan: 1 and Ondansetron, Dexamethasone and Treatment may vary due to age or medical condition  Airway Management Planned: Oral ETT  Additional Equipment:   Intra-op Plan:   Post-operative Plan:  Extubation in OR  Informed Consent: I have reviewed the patients History and Physical, chart, labs and discussed the procedure including the risks, benefits and alternatives for the proposed anesthesia with the patient or authorized representative who has indicated his/her understanding and acceptance.     Dental advisory given  Plan Discussed with: CRNA and Surgeon  Anesthesia Plan Comments:        Anesthesia Quick Evaluation

## 2020-09-15 NOTE — Progress Notes (Signed)
eLink Physician-Brief Progress Note Patient Name: [name] DOB: 01/11/1986 MRN: [id number]   Date of Service  09/15/2020  HPI/Events of Note  Lactic acid 5.0  eICU Interventions  LR bolus 500 ml / hour x 1 liter ordered.        Migdalia Dk 09/15/2020, 11:33 PM

## 2020-09-16 DIAGNOSIS — K6812 Psoas muscle abscess: Secondary | ICD-10-CM | POA: Diagnosis not present

## 2020-09-16 DIAGNOSIS — R6521 Severe sepsis with septic shock: Secondary | ICD-10-CM

## 2020-09-16 DIAGNOSIS — R7881 Bacteremia: Secondary | ICD-10-CM | POA: Diagnosis not present

## 2020-09-16 DIAGNOSIS — M00052 Staphylococcal arthritis, left hip: Secondary | ICD-10-CM | POA: Diagnosis not present

## 2020-09-16 DIAGNOSIS — K7030 Alcoholic cirrhosis of liver without ascites: Secondary | ICD-10-CM | POA: Diagnosis not present

## 2020-09-16 DIAGNOSIS — B9561 Methicillin susceptible Staphylococcus aureus infection as the cause of diseases classified elsewhere: Secondary | ICD-10-CM | POA: Diagnosis not present

## 2020-09-16 LAB — CULTURE, BLOOD (ROUTINE X 2)
Special Requests: ADEQUATE
Special Requests: ADEQUATE

## 2020-09-16 LAB — HEMOGLOBIN AND HEMATOCRIT, BLOOD
HCT: 23.3 % — ABNORMAL LOW (ref 39.0–52.0)
Hemoglobin: 7.9 g/dL — ABNORMAL LOW (ref 13.0–17.0)

## 2020-09-16 LAB — BASIC METABOLIC PANEL
Anion gap: 12 (ref 5–15)
BUN: 16 mg/dL (ref 6–20)
CO2: 17 mmol/L — ABNORMAL LOW (ref 22–32)
Calcium: 7 mg/dL — ABNORMAL LOW (ref 8.9–10.3)
Chloride: 104 mmol/L (ref 98–111)
Creatinine, Ser: 0.58 mg/dL — ABNORMAL LOW (ref 0.61–1.24)
GFR, Estimated: >60 mL/min (ref >60)
Glucose, Bld: 127 mg/dL — ABNORMAL HIGH (ref 70–99)
Potassium: 4.7 mmol/L (ref 3.5–5.1)
Sodium: 133 mmol/L — ABNORMAL LOW (ref 135–145)

## 2020-09-16 LAB — PROCALCITONIN: Procalcitonin: 21.56 ng/mL

## 2020-09-16 LAB — CBC WITH DIFFERENTIAL/PLATELET
Abs Immature Granulocytes: 2.76 10*3/uL — ABNORMAL HIGH (ref 0.00–0.07)
Basophils Absolute: 0.1 10*3/uL (ref 0.0–0.1)
Basophils Relative: 0 %
Eosinophils Absolute: 0 10*3/uL (ref 0.0–0.5)
Eosinophils Relative: 0 %
HCT: 19.5 % — ABNORMAL LOW (ref 39.0–52.0)
Hemoglobin: 6.4 g/dL — CL (ref 13.0–17.0)
Immature Granulocytes: 9 %
Lymphocytes Relative: 6 %
Lymphs Abs: 1.9 10*3/uL (ref 0.7–4.0)
MCH: 32.3 pg (ref 26.0–34.0)
MCHC: 32.8 g/dL (ref 30.0–36.0)
MCV: 98.5 fL (ref 80.0–100.0)
Monocytes Absolute: 0.6 10*3/uL (ref 0.1–1.0)
Monocytes Relative: 2 %
Neutro Abs: 26.2 10*3/uL — ABNORMAL HIGH (ref 1.7–7.7)
Neutrophils Relative %: 83 %
Platelets: 84 10*3/uL — ABNORMAL LOW (ref 150–400)
RBC: 1.98 MIL/uL — ABNORMAL LOW (ref 4.22–5.81)
RDW: 19.6 % — ABNORMAL HIGH (ref 11.5–15.5)
WBC: 31.6 10*3/uL — ABNORMAL HIGH (ref 4.0–10.5)
nRBC: 1.5 % — ABNORMAL HIGH (ref 0.0–0.2)

## 2020-09-16 LAB — MAGNESIUM: Magnesium: 1.9 mg/dL (ref 1.7–2.4)

## 2020-09-16 LAB — PREPARE RBC (CROSSMATCH)

## 2020-09-16 LAB — PHOSPHORUS: Phosphorus: 4.4 mg/dL (ref 2.5–4.6)

## 2020-09-16 MED ORDER — SODIUM CHLORIDE 0.9% IV SOLUTION: Freq: Once | INTRAVENOUS | Status: AC

## 2020-09-16 NOTE — Progress Notes (Addendum)
CRITICAL VALUE STICKER  CRITICAL VALUE: Hemoglobin 6.4  RECEIVER (on-site recipient of call): Shefali Ng RN   DATE & TIME NOTIFIED: 09/16/2020 @0310   MESSENGER (representative from lab):  MD NOTIFIED: Sheria Lang  TIME OF NOTIFICATION: 0314  RESPONSE: Awaiting response

## 2020-09-16 NOTE — TOC Initial Note (Signed)
Transition of Care [facility]) - Initial/Assessment Note    Patient Details  Name: [name] MRN: [id number] Date of Birth: 1985-11-07  [facility] CM/SW Contact:    Golda Acre, RN Phone Number: 09/16/2020, 8:06 AM  Clinical Narrative:                 36 year old malewith medical history significant ofAVN of hip, HTN, GERD. Presented with fatigue and left leg pain on 09/13/20. He had recent admission for COVID (08/22/20) and alcoholic hepatitis where he was on prednisolone therapy with plan to continue it through early March. He was discharged home on 08/29/20.   The left leg pain had progressed over a 2 week period of time. He was found to have left iliopsoas and left gluteal abscesses w/diffuse swelling and left hip effusion. He is also noted to have MSSA bacteremia and is being followed by ID and is on cefazolin. TTE is negative for vegetations and he is planned for a TEE.   Interventional radiology 3 drains in place on 09/14/20 via CT-guided placement with a 14 French drainage catheter into the left retroperitoneal abscess into 12 French drains in the medial and lateral abscesses with the anterior aspect of left thigh. He initially had 1.3L drained and per report has drained another 2L today.   He went to the OR for left hip arthroplasty with antibiotic spacer placement and debridement. Post-operatively he has hypotension despite 2 units PRBCS, 2.2 liters of crystalloid, and of albumin.  PLAN:  MAY NEED SNF PLACEMENT FOR POST HOSPITAL CARE AND IV ABX, LIVES ALONE Expected Discharge Plan: Skilled Nursing Facility Barriers to Discharge: Continued Medical Work up   Patient Goals and CMS Choice Patient states their goals for this hospitalization and ongoing recovery are:: to go home if possible CMS Medicare.gov Compare Post Acute Care list provided to:: Patient    Expected Discharge Plan and Services Expected Discharge Plan: Skilled Nursing Facility    Discharge Planning Services: CM Consult   Living arrangements for the past 2 months: Single Family Home                                      Prior Living Arrangements/Services Living arrangements for the past 2 months: Single Family Home Lives with:: Self Patient language and need for interpreter reviewed:: Yes Do you feel safe going back to the place where you live?: Yes      Need for Family Participation in Patient Care: No (Comment) Care giver support system in place?: No (comment)   Criminal Activity/Legal Involvement Pertinent to Current Situation/Hospitalization: No - Comment as needed  Activities of Daily Living Home Assistive Devices/Equipment: Crutches ADL Screening (condition at time of admission) Patient's cognitive ability adequate to safely complete daily activities?: Yes Is the patient deaf or have difficulty hearing?: No Does the patient have difficulty seeing, even when wearing glasses/contacts?: No Does the patient have difficulty concentrating, remembering, or making decisions?: No Patient able to express need for assistance with ADLs?: Yes Does the patient have difficulty dressing or bathing?: No Independently performs ADLs?: Yes (appropriate for developmental age) Does the patient have difficulty walking or climbing stairs?: Yes Weakness of Legs: Left Weakness of Arms/Hands: None  Permission Sought/Granted                  Emotional Assessment Appearance:: Appears stated age Attitude/Demeanor/Rapport: Engaged Affect (typically observed):  Calm Orientation: : Oriented to Self,Oriented to Place,Oriented to  Time,Oriented to Situation Alcohol / Substance Use: Not Applicable Psych Involvement: No (comment)  Admission diagnosis:  GI bleed [K92.2] Left hip pain [M25.552] Abscess of muscle [M60.009] Alcoholic cirrhosis of liver without ascites (HCC) [K70.30] Cellulitis of left thigh [L03.116] Sepsis (HCC) [A41.9] Symptomatic anemia  [D64.9] Abscess of left lower extremity [L02.416] MSSA bacteremia [R78.81, B95.61] Patient Active Problem List   Diagnosis Date Noted  . Septic arthritis (HCC) 09/14/2020  . MSSA bacteremia 09/14/2020  . 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 Sepsis (HCC) 09/13/2020  . ARF (acute renal failure) (HCC) 08/22/2020  . Thrombocytopenia (HCC) 08/22/2020  . Alcoholic hepatitis 08/22/2020  . Hypertension 08/22/2020  . COVID-19 virus infection 08/22/2020  . Alcohol abuse   . MDD (major depressive disorder) 10/15/2013  . Alcohol dependence (HCC) 04/02/2013  . Social anxiety disorder 04/02/2013  . ALLERGIC RHINITIS 03/28/2007   PCP:  [name], PA Pharmacy:   [facility] [address] [place] - [address] [place] [address] Phone: [phone] Fax: [phone]     Social Determinants of Health (SDOH) Interventions    Readmission Risk Interventions No flowsheet data found.

## 2020-09-16 NOTE — Progress Notes (Addendum)
Referring Physician(s): Landau,J  Supervising Physician: Gilmer Mor  Patient Status:  [facility] - In-pt  Chief Complaint:  Left hip/thigh pain/abscesses  Subjective: Pt doing ok this am; still appears pale, transferred to ICU last night for septic shock post op ; currently denies fever/chills, worsening resp issues, N/V; still with some left hip/thigh soreness as expected; all 3 IR drains remain in place   Allergies: Cashew nut (anacardium occidentale) skin test and Cashew nut oil  Medications: Prior to Admission medications   Medication Sig Start Date End Date Taking? Authorizing Provider  carvedilol (COREG) 3.125 MG tablet Take 3.125 mg by mouth 2 (two) times daily. 07/28/20  Yes [provider]  folic acid (FOLVITE) 1 MG tablet Take 1 tablet (1 mg total) by mouth daily. 08/30/20  Yes Pokhrel, Laxman, MD  guaiFENesin-dextromethorphan (ROBITUSSIN DM) 100-10 MG/5ML syrup Take 5 mLs by mouth every 4 (four) hours as needed for cough (chest congestion). Patient taking differently: Take 10 mLs by mouth every 4 (four) hours as needed for cough (chest congestion). 08/29/20  Yes Pokhrel, Laxman, MD  lactulose (CHRONULAC) 10 GM/15ML solution Take 15 mLs (10 g total) by mouth 2 (two) times daily. 08/29/20  Yes Pokhrel, Laxman, MD  magnesium oxide (MAG-OX) 400 MG tablet Take 1 tablet by mouth daily. 07/28/20  Yes [provider]  pantoprazole (PROTONIX) 40 MG tablet Take 1 tablet (40 mg total) by mouth 2 (two) times daily before a meal for 28 days, THEN 1 tablet (40 mg total) daily for 28 days. 08/29/20 10/24/20 Yes Pokhrel, Laxman, MD  prednisoLONE (ORAPRED) 15 MG/5ML solution Take 1.6-13.3 mLs by mouth See admin instructions. Take 13.63ml by mouth daily for 28 days, 65ml for 4 days, 6.64ml daily for 4 days, 3.3 daily for 4 days, then 1.20ml daily for 3 days. 08/29/20  Yes [provider]  sertraline (ZOLOFT) 25 MG tablet Take 25 mg by mouth at bedtime. 08/20/20  Yes [provider]  thiamine 100 MG tablet Take 1 tablet (100 mg total) by mouth daily. 08/30/20  Yes Pokhrel, Laxman, MD  buPROPion (WELLBUTRIN XL) 300 MG 24 hr tablet Take 1 tablet (300 mg total) by mouth daily. Patient not taking: No sig reported 10/16/13   Rachael Fee, MD  carbamazepine (TEGRETOL XR) 200 MG 12 hr tablet Take 1 tablet (200 mg total) by mouth at bedtime. Patient not taking: No sig reported 10/16/13   Rachael Fee, MD  hydrOXYzine (ATARAX/VISTARIL) 25 MG tablet Take 1 tablet (25 mg total) by mouth every 6 (six) hours as needed for anxiety (or CIWA score </= 10). Patient not taking: No sig reported 10/16/13   Rachael Fee, MD  lisinopril-hydrochlorothiazide (ZESTORETIC) 20-25 MG tablet Take 1 tablet by mouth daily. Patient not taking: No sig reported 08/31/20   Pokhrel, Rebekah [place], MD  oxyCODONE (ROXICODONE) 5 MG immediate release tablet Take 1 tablet (5 mg total) by mouth every 8 (eight) hours as needed. Patient not taking: No sig reported 08/29/20 08/29/21  Pokhrel, Rebekah Chester[place], MD  prednisoLONE 5 MG TABS tablet Take 8 tablets (40 mg total) by mouth daily for 28 days, THEN 6 tablets (30 mg total) daily for 4 days, THEN 4 tablets (20 mg total) daily for 4 days, THEN 2 tablets (10 mg total) daily for 4 days, THEN 1 tablet (5 mg total) every 3 (three) days for 1 day. Patient not taking: Reported on 09/13/2020 08/29/20 10/09/20  Joycelyn Das, MD  traMADol (ULTRAM) 50 MG tablet Take 1 tablet (  50 mg total) by mouth every 6 (six) hours as needed. Patient not taking: Reported on 09/13/2020 08/29/20   Joycelyn Das, MD     Vital Signs: BP (!) 141/79   Pulse (!) 110   Temp 98.8 F (37.1 C) (Axillary)   Resp 20   Ht  (1.778 m)   Wt 187 lb 9.8 oz (85.1 kg)   SpO2 100%   BMI 26.92 kg/m   Physical Exam awake/alert; left RP/thigh drains intact, insertion sites ok, mildly tender, outputs range from 30 cc in left outer thigh drain to 495 cc in left RP drain turbid, blood-tinged fluid; drains are  being irrigated  Imaging: DG Chest 1 View  Result Date: 09/13/2020 CLINICAL DATA:  Leg swelling EXAM: CHEST  1 VIEW COMPARISON:  08/25/2020. FINDINGS: Mild hypoinflation. No pneumothorax, pleural effusion or focal consolidation. Cardiomediastinal silhouette within normal limits. No acute osseous abnormality. IMPRESSION: No focal airspace disease. Electronically Signed   By: Stana Bunting M.D.   On: 09/13/2020 10:59   CT ABDOMEN PELVIS W CONTRAST  Result Date: 09/13/2020 CLINICAL DATA:  Hepatic cirrhosis. EXAM: CT ABDOMEN AND PELVIS WITH CONTRAST TECHNIQUE: Multidetector CT imaging of the abdomen and pelvis was performed using the standard protocol following bolus administration of intravenous contrast. CONTRAST:  OMNIPAQUE IOHEXOL 300 MG/ML  SOLN COMPARISON:  None. FINDINGS: Lower chest: No acute abnormality. Hepatobiliary: No gallstones or biliary dilatation is noted. Mildly nodular hepatic contours are noted suggesting hepatic cirrhosis. No definite focal hepatic abnormality is noted. Pancreas: Unremarkable. No pancreatic ductal dilatation or surrounding inflammatory changes. Spleen: Normal in size without focal abnormality. Adrenals/Urinary Tract: Adrenal glands are unremarkable. Kidneys are normal, without renal calculi, focal lesion, or hydronephrosis. Bladder is unremarkable. Stomach/Bowel: Stomach is within normal limits. Appendix appears normal. No evidence of bowel wall thickening, distention, or inflammatory changes. Vascular/Lymphatic: No significant vascular findings are present. No enlarged abdominal or pelvic lymph nodes. Reproductive: Prostate is unremarkable. Other: There is the interval development of fluid collections within the musculature of the left ileo psoas muscle as well as the musculature of the visualized portion of the left gluteal region and proximal left thigh. Diffuse swelling is noted, and these findings are most consistent with innumerable abscesses that have  formed within the musculature. Mild subcutaneous edema or inflammation is seen involving the visualized portions of the left upper thigh and hip region. Musculoskeletal: There is significant destruction of the left femoral head which may be related to chronic septic arthritis or inflammation. Left hip effusion is noted. IMPRESSION: 1. Findings consistent with hepatic cirrhosis. 2. Interval development of innumerable fluid collections are seen within the musculature of the left ileopsoas muscle as well as the musculature of the visualized portion of the left gluteal region and proximal left thigh consistent with innumerable abscesses that have formed within the musculature, several of which are quite large. Diffuse swelling is noted, and there is noted significant destruction of the left femoral head which may be related to chronic septic arthritis or inflammation. Left hip effusion is noted. 3. Mild subcutaneous edema or inflammation is seen involving the visualized portions of the left upper thigh and hip region. Electronically Signed   By: Lupita Raider M.D.   On: 09/13/2020 12:23   DG Pelvis Portable  Result Date: 09/15/2020 CLINICAL DATA:  Status post total left hip arthroplasty. EXAM: PORTABLE PELVIS 1-2 VIEWS COMPARISON:  None. FINDINGS: A left hip replacement is seen without evidence of surrounding lucency. There is no evidence of pelvic  fracture or diastasis. No pelvic bone lesions are seen. Radiopaque surgical drains are seen. A mild-to-moderate amount of soft tissue air is seen along the lateral aspect of the left hip. IMPRESSION: Status post left hip replacement. Electronically Signed   By: Aram Candela M.D.   On: 09/15/2020 20:20   DG Chest Port 1 View  Result Date: 09/15/2020 CLINICAL DATA:  Shock. EXAM: PORTABLE CHEST 1 VIEW COMPARISON:  September 13, 2020 FINDINGS: Mildly decreased lung volumes are seen which is likely secondary to the degree of patient inspiration. Very mild atelectasis  and/or early infiltrate is noted within the right lung base. There is no evidence of a pleural effusion or pneumothorax. The heart size and mediastinal contours are within normal limits. The visualized skeletal structures are unremarkable. IMPRESSION: Very mild right basilar atelectasis and/or early infiltrate. Electronically Signed   By: Aram Candela M.D.   On: 09/15/2020 20:20   ECHOCARDIOGRAM COMPLETE  Result Date: 09/14/2020    ECHOCARDIOGRAM REPORT   Patient Name:   [name] Date of Exam: 09/14/2020 Medical Rec #:  [id number]         Height:       70.0 in Accession #:    [id number]        Weight:       174.0 lb Date of Birth:  [birth year]         BSA:          1.967 m Patient Age:    34 years          BP:           116/82 mmHg Patient Gender: M                 HR:           118 bpm. Exam Location:  Inpatient Procedure: 2D Echo, Cardiac Doppler and Color Doppler Indications:    Bacteremia; Z01.818 Encounter for other preprocedural                 examination  History:        Patient has prior history of Echocardiogram examinations, most                 recent 08/25/2020. Abnormal ECG, Signs/Symptoms:Shortness of                 Breath and Dyspnea; Risk Factors:Hypertension. ETOH. Covid                 infection.  Sonographer:    Sheralyn Boatman RDCS Referring Phys: [id number] [facility]  Sonographer Comments: Technically difficult study due to poor echo windows. Patient has hip infection, in pain, could not move. High fowler's position. IMPRESSIONS  1. Left ventricular ejection fraction, by estimation, is 60 to 65%. The left ventricle has normal function. The left ventricle has no regional wall motion abnormalities. There is moderate concentric left ventricular hypertrophy. Left ventricular diastolic function could not be evaluated. Elevated left ventricular end-diastolic pressure.  2. Right ventricular systolic function is normal. The right ventricular size is normal. Tricuspid regurgitation signal  is inadequate for assessing PA pressure.  3. The mitral valve is normal in structure. No evidence of mitral valve regurgitation. No evidence of mitral stenosis.  4. The aortic valve is normal in structure. Aortic valve regurgitation is not visualized. No aortic stenosis is present.  5. The inferior vena cava is normal in size with greater than 50% respiratory variability, suggesting right atrial pressure of  3 mmHg.  6. Trivial pericardial effusion is present. The pericardial effusion is circumferential. FINDINGS  Left Ventricle: Left ventricular ejection fraction, by estimation, is 60 to 65%. The left ventricle has normal function. The left ventricle has no regional wall motion abnormalities. The left ventricular internal cavity size was normal in size. There is  moderate concentric left ventricular hypertrophy. Left ventricular diastolic function could not be evaluated. Elevated left ventricular end-diastolic pressure. Right Ventricle: The right ventricular size is normal. No increase in right ventricular wall thickness. Right ventricular systolic function is normal. Tricuspid regurgitation signal is inadequate for assessing PA pressure. Left Atrium: Left atrial size was normal in size. Right Atrium: Right atrial size was normal in size. Pericardium: Trivial pericardial effusion is present. The pericardial effusion is circumferential. Mitral Valve: The mitral valve is normal in structure. No evidence of mitral valve regurgitation. No evidence of mitral valve stenosis. Tricuspid Valve: The tricuspid valve is normal in structure. Tricuspid valve regurgitation is mild . No evidence of tricuspid stenosis. Aortic Valve: The aortic valve is normal in structure. Aortic valve regurgitation is not visualized. No aortic stenosis is present. Pulmonic Valve: The pulmonic valve was normal in structure. Pulmonic valve regurgitation is not visualized. No evidence of pulmonic stenosis. Aorta: The aortic root is normal in size and  structure. Venous: The inferior vena cava is normal in size with greater than 50% respiratory variability, suggesting right atrial pressure of 3 mmHg. IAS/Shunts: No atrial level shunt detected by color flow Doppler.  LEFT VENTRICLE PLAX 2D LVIDd:         4.70 cm      Diastology LVIDs:         2.80 cm      LV e' medial:    3.81 cm/s LV PW:         1.50 cm      LV E/e' medial:  31.0 LV IVS:        1.40 cm      LV e' lateral:   6.64 cm/s LVOT diam:     2.30 cm      LV E/e' lateral: 17.8 LV SV:         78 LV SV Index:   39 LVOT Area:     4.15 cm  LV Volumes (MOD) LV vol d, MOD A2C: 69.7 ml LV vol d, MOD A4C: 115.0 ml LV vol s, MOD A2C: 25.9 ml LV vol s, MOD A4C: 47.1 ml LV SV MOD A2C:     43.8 ml LV SV MOD A4C:     115.0 ml LV SV MOD BP:      51.8 ml IVC IVC diam: 1.50 cm LEFT ATRIUM           Index       RIGHT ATRIUM           Index LA diam:      3.80 cm 1.93 cm/m  RA Area:     12.30 cm LA Vol (A2C): 39.5 ml 20.08 ml/m RA Volume:   23.60 ml  12.00 ml/m LA Vol (A4C): 32.6 ml 16.57 ml/m  AORTIC VALVE LVOT Vmax:   130.00 cm/s LVOT Vmean:  86.000 cm/s LVOT VTI:    0.187 m  AORTA Ao Root diam: 3.30 cm Ao Asc diam:  3.20 cm MITRAL VALVE MV Area (PHT): 5.84 cm     SHUNTS MV Decel Time: 130 msec     Systemic VTI:  0.19 m MV E velocity: 118.00 cm/s  Systemic  Diam: 2.30 cm Armanda Magic MD Electronically signed by Armanda Magic MD Signature Date/Time: 09/14/2020/4:26:40 PM    Final    DG Hip Port Unilat With Pelvis 1V Left  Result Date: 09/15/2020 CLINICAL DATA:  Status post left hip arthroplasty. EXAM: DG HIP (WITH OR WITHOUT PELVIS) 1V PORT LEFT COMPARISON:  None. FINDINGS: A left hip replacement is seen without evidence of surrounding lucency. There is no evidence of acute hip fracture or dislocation. There is no evidence of arthropathy or other focal bone abnormality. Multiple radiopaque surgical drains are seen. Radiopaque skin staples are also noted. IMPRESSION: Left hip replacement without evidence of hardware  complication or fracture. Electronically Signed   By: Aram Candela M.D.   On: 09/15/2020 20:21   CT IMAGE GUIDED FLUID DRAIN BY CATHETER  Result Date: 09/14/2020 INDICATION: Septic arthritis affecting left hip now with complex retroperitoneal and left thigh collections. Please perform image guided drainage catheter placement for infection source control purposes prior to definitive operative debridement EXAM: ULTRASOUND AND CT-GUIDED PERCUTANEOUS DRAINAGE CATHETER PLACEMENT X3 COMPARISON:  CT abdomen pelvis-09/13/2020 MEDICATIONS: The patient is currently admitted to the hospital and receiving intravenous antibiotics. The antibiotics were administered within an appropriate time frame prior to the initiation of the procedure. ANESTHESIA/SEDATION: Moderate (conscious) sedation was employed during this procedure. A total of Versed 6 mg and Fentanyl 200 mcg was administered intravenously. Moderate Sedation Time: 50 minutes. The patient's level of consciousness and vital signs were monitored continuously by radiology nursing throughout the procedure under my direct supervision. CONTRAST:  None COMPLICATIONS: None immediate. PROCEDURE: Informed written consent was obtained from the patient after a discussion of the risks, benefits and alternatives to treatment. The patient was placed supine on the CT gantry and a pre procedural CT was performed re-demonstrating the known abscess/fluid collection within the left retroperitoneal space as well as ill-defined fluid collections involving the medial and lateral aspects of thigh. The collections were identified sonographically and the procedure was planned. A timeout was performed prior to the initiation of the procedure. The skin overlying the anterior aspect of the left groin and thigh were prepped and draped in the usual sterile fashion. The overlying soft tissues were anesthetized with 1% lidocaine with epinephrine. Under direct ultrasound guidance, the  retroperitoneal collection was accessed at the level of the left groin directed in a caudal to cranial trajectory with an 18 gauge trocar needle. Short Amplatz wire was coiled within collection. Next, both the medial and lateral collections within the anterior thigh were also accessed with 18 gauge trocar needles, also in a caudal to cranial trajectory with short Amplatz wires were coiled within both collections. Multiple ultrasound images were saved for procedural documentation purposes. Appropriate position was confirmed with CT imaging Next, tracks were dilated allowing placement of a 14 French drainage catheter within the retroperitoneal collection and 12 French drainage catheters within the medial and lateral anterior thigh collections. Appropriate position was confirmed with CT imaging. After drainage catheter placement, approximately 1.3 L of purulent fluid was aspirated from all collections. A small sample of aspirated fluid was capped and sent to the laboratory for analysis. Postprocedural CT imaging was performed demonstrating significant reduction in size of complex fluid collections (series 6). The drainage catheters were connected to gravity bags and sutured in place. Dressings were applied. The patient tolerated the procedure well without immediate post procedural complication. IMPRESSION: Successful ultrasound and CT guided placement of a 59 French all purpose drain catheter into the retroperitoneal abscess with two 12 [place]  all-purpose drainage catheter was placed into the medial and lateral anterior left thigh abscesses. A total of 1.3 L of purulent fluid was aspirated from all collections. A representative sample of aspirated fluid was sent to the laboratory as requested by the ordering clinical team. Electronically Signed   By: Simonne ComeJohn  Watts M.D.   On: 09/14/2020 13:56   CT IMAGE GUIDED FLUID DRAIN BY CATHETER  Result Date: 09/14/2020 INDICATION: Septic arthritis affecting left hip now with  complex retroperitoneal and left thigh collections. Please perform image guided drainage catheter placement for infection source control purposes prior to definitive operative debridement EXAM: ULTRASOUND AND CT-GUIDED PERCUTANEOUS DRAINAGE CATHETER PLACEMENT X3 COMPARISON:  CT abdomen pelvis-09/13/2020 MEDICATIONS: The patient is currently admitted to the hospital and receiving intravenous antibiotics. The antibiotics were administered within an appropriate time frame prior to the initiation of the procedure. ANESTHESIA/SEDATION: Moderate (conscious) sedation was employed during this procedure. A total of Versed 6 mg and Fentanyl 200 mcg was administered intravenously. Moderate Sedation Time: 50 minutes. The patient's level of consciousness and vital signs were monitored continuously by radiology nursing throughout the procedure under my direct supervision. CONTRAST:  None COMPLICATIONS: None immediate. PROCEDURE: Informed written consent was obtained from the patient after a discussion of the risks, benefits and alternatives to treatment. The patient was placed supine on the CT gantry and a pre procedural CT was performed re-demonstrating the known abscess/fluid collection within the left retroperitoneal space as well as ill-defined fluid collections involving the medial and lateral aspects of thigh. The collections were identified sonographically and the procedure was planned. A timeout was performed prior to the initiation of the procedure. The skin overlying the anterior aspect of the left groin and thigh were prepped and draped in the usual sterile fashion. The overlying soft tissues were anesthetized with 1% lidocaine with epinephrine. Under direct ultrasound guidance, the retroperitoneal collection was accessed at the level of the left groin directed in a caudal to cranial trajectory with an 18 gauge trocar needle. Short Amplatz wire was coiled within collection. Next, both the medial and lateral collections  within the anterior thigh were also accessed with 18 gauge trocar needles, also in a caudal to cranial trajectory with short Amplatz wires were coiled within both collections. Multiple ultrasound images were saved for procedural documentation purposes. Appropriate position was confirmed with CT imaging Next, tracks were dilated allowing placement of a 14 French drainage catheter within the retroperitoneal collection and 12 French drainage catheters within the medial and lateral anterior thigh collections. Appropriate position was confirmed with CT imaging. After drainage catheter placement, approximately 1.3 L of purulent fluid was aspirated from all collections. A small sample of aspirated fluid was capped and sent to the laboratory for analysis. Postprocedural CT imaging was performed demonstrating significant reduction in size of complex fluid collections (series 6). The drainage catheters were connected to gravity bags and sutured in place. Dressings were applied. The patient tolerated the procedure well without immediate post procedural complication. IMPRESSION: Successful ultrasound and CT guided placement of a 4014 French all purpose drain catheter into the retroperitoneal abscess with two 12 [place] all-purpose drainage catheter was placed into the medial and lateral anterior left thigh abscesses. A total of 1.3 L of purulent fluid was aspirated from all collections. A representative sample of aspirated fluid was sent to the laboratory as requested by the ordering clinical team. Electronically Signed   By: Simonne ComeJohn  Watts M.D.   On: 09/14/2020 13:56   CT IMAGE GUIDED FLUID DRAIN BY  CATHETER  Result Date: 09/14/2020 INDICATION: Septic arthritis affecting left hip now with complex retroperitoneal and left thigh collections. Please perform image guided drainage catheter placement for infection source control purposes prior to definitive operative debridement EXAM: ULTRASOUND AND CT-GUIDED PERCUTANEOUS DRAINAGE  CATHETER PLACEMENT X3 COMPARISON:  CT abdomen pelvis-09/13/2020 MEDICATIONS: The patient is currently admitted to the hospital and receiving intravenous antibiotics. The antibiotics were administered within an appropriate time frame prior to the initiation of the procedure. ANESTHESIA/SEDATION: Moderate (conscious) sedation was employed during this procedure. A total of Versed 6 mg and Fentanyl 200 mcg was administered intravenously. Moderate Sedation Time: 50 minutes. The patient's level of consciousness and vital signs were monitored continuously by radiology nursing throughout the procedure under my direct supervision. CONTRAST:  None COMPLICATIONS: None immediate. PROCEDURE: Informed written consent was obtained from the patient after a discussion of the risks, benefits and alternatives to treatment. The patient was placed supine on the CT gantry and a pre procedural CT was performed re-demonstrating the known abscess/fluid collection within the left retroperitoneal space as well as ill-defined fluid collections involving the medial and lateral aspects of thigh. The collections were identified sonographically and the procedure was planned. A timeout was performed prior to the initiation of the procedure. The skin overlying the anterior aspect of the left groin and thigh were prepped and draped in the usual sterile fashion. The overlying soft tissues were anesthetized with 1% lidocaine with epinephrine. Under direct ultrasound guidance, the retroperitoneal collection was accessed at the level of the left groin directed in a caudal to cranial trajectory with an 18 gauge trocar needle. Short Amplatz wire was coiled within collection. Next, both the medial and lateral collections within the anterior thigh were also accessed with 18 gauge trocar needles, also in a caudal to cranial trajectory with short Amplatz wires were coiled within both collections. Multiple ultrasound images were saved for procedural  documentation purposes. Appropriate position was confirmed with CT imaging Next, tracks were dilated allowing placement of a 14 French drainage catheter within the retroperitoneal collection and 12 French drainage catheters within the medial and lateral anterior thigh collections. Appropriate position was confirmed with CT imaging. After drainage catheter placement, approximately 1.3 L of purulent fluid was aspirated from all collections. A small sample of aspirated fluid was capped and sent to the laboratory for analysis. Postprocedural CT imaging was performed demonstrating significant reduction in size of complex fluid collections (series 6). The drainage catheters were connected to gravity bags and sutured in place. Dressings were applied. The patient tolerated the procedure well without immediate post procedural complication. IMPRESSION: Successful ultrasound and CT guided placement of a 89 French all purpose drain catheter into the retroperitoneal abscess with two 12 [place] all-purpose drainage catheter was placed into the medial and lateral anterior left thigh abscesses. A total of 1.3 L of purulent fluid was aspirated from all collections. A representative sample of aspirated fluid was sent to the laboratory as requested by the ordering clinical team. Electronically Signed   By: Simonne Come M.D.   On: 09/14/2020 13:56    Labs:  CBC: Recent Labs    09/14/20 0525 09/15/20 0450 09/15/20 1745 09/15/20 2052 09/16/20 0238  WBC 8.3 12.8*  --  24.4* 31.6*  HGB 7.6* 8.0* 8.8* 8.0* 6.4*  HCT 22.6* 24.4* 26.0* 24.0* 19.5*  PLT 140* 139*  --  86* 84*    COAGS: Recent Labs    08/22/20 1817 08/25/20 0349 08/29/20 0322 09/13/20 1045 09/14/20 0525 09/15/20 2300  INR 1.4*   < > 1.8* 1.4* 1.5* 2.2*  APTT 42*  --   --   --   --   --    < > = values in this interval not displayed.    BMP: Recent Labs    09/14/20 0525 09/15/20 0450 09/15/20 1745 09/15/20 2300 09/16/20 0238  NA 131* 128*  130* 132* 133*  K 3.9 3.8 4.5 4.5 4.7  CL 101 98  --  103 104  CO2 22 20*  --  18* 17*  GLUCOSE 139* 100*  --  105* 127*  BUN 16 12  --  16 16  CALCIUM 7.8* 7.8*  --  7.1* 7.0*  CREATININE 0.44* 0.54*  --  0.67 0.58*  GFRNONAA >60 >60  --  >60 >60    LIVER FUNCTION TESTS: Recent Labs    09/13/20 1045 09/14/20 0525 09/15/20 0450 09/15/20 2300  BILITOT 2.3* 2.0* 1.5* 3.2*  AST 40 30 42* 38  ALT 42 32 29 16  ALKPHOS 156* 130* 191* 123  PROT 6.7 6.0* 5.8* 4.6*  ALBUMIN 2.2* 2.0* 1.9* 2.1*    Assessment and Plan: 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 and development of left RP/thigh abscesses; s/p left RP/left medial/lateral thigh drains 2/23; s/p left total hip arthroplasty with antibiotic impregnated cement interposition spacer and left hip excisional debridement of skin, subcutaneous tissue, muscle, bone and deep abscess 2/24; currently afebrile, WBC 31.6 up from 24.4, hemoglobin 6.4 down from 8, platelets 84k, creatinine 0.58, drain fluid cultures growing abundant staph aureus, susceptibilities pending, blood cultures negative to date; continue drains/irrigation until output minimal(< 10-15 cc/24 hrs x 2-3 days) then obtain follow-up CT to assess adequacy of drainage unless clinical status worsens necessitating earlier CT; additional plans as per CCM/Ortho   Electronically Signed: D. Jeananne Rama, PA-C 09/16/2020, 11:21 AM   I spent a total of 15 minutes at the the patient's bedside AND on the patient's hospital floor or unit, greater than 50% of which was counseling/coordinating care for left retroperitoneal/thigh abscess drains    Patient ID: [name], male   DOB: 1985/12/06, 35 y.o.   MRN: [id number]

## 2020-09-16 NOTE — Progress Notes (Signed)
NAME:  [name], MRN:  [id number], DOB:  June 15, 1986, LOS: 3 ADMISSION DATE:  09/13/2020, CONSULTATION DATE: 09/15/2020 REFERRING MD:  Teryl Lucy, CHIEF COMPLAINT:  Hypotension   Brief History:  [name] a 35 year old malewith medical history significant ofAVN of hip, HTN, GERD. Presented with fatigue and left leg pain on 09/13/20. He had recent admission for COVID (08/22/20) and alcoholic hepatitis where he was on prednisolone therapy with plan to continue it through early March. He was discharged home on 08/29/20.   The left leg pain had progressed over a 2 week period of time. He was found to have left iliopsoas and left gluteal abscesses w/diffuse swelling and left hip effusion. He is also noted to have MSSA bacteremia and is being followed by ID and is on cefazolin. TTE is negative for vegetations and he is planned for a TEE.   Interventional radiology 3 drains in place on 09/14/20 via CT-guided placement with a 14 French drainage catheter into the left retroperitoneal abscess into 12 French drains in the medial and lateral abscesses with the anterior aspect of left thigh. He initially had 1.3L drained and per report has drained another 2L today.   He went to the OR for left hip arthroplasty with antibiotic spacer placement and debridement. Post-operatively he has hypotension despite 2 units PRBCS, 2.2 liters of crystalloid, and of albumin.   PCCM was called to transfer the patient to the ICU.   Past Medical History:   Past Medical History:  Diagnosis Date  . Alcohol abuse   . Asthma   . Depression   . Hypertension    Significant Hospital Events:  2/23 - IR placed 3 percutaneous drains 2/24 - left total hip arthorplasty with antibiotic spacers placed, abscess debridement 2/24 - Transferred to the ICU for hypotension  Consults:  PCCM Orthopedic surgery Infectious disease  Procedures:  2/23 - IR placed 3 percutaneous drains 2/24 - left total hip  arthorplasty with antibiotic spacers placed, abscess debridement  Significant Diagnostic Tests:  CT Abdomen/Pelvis 09/13/20 1. Findings consistent with hepatic cirrhosis. 2. Interval development of innumerable fluid collections are seen within the musculature of the left ileopsoas muscle as well as the musculature of the visualized portion of the left gluteal region and proximal left thigh consistent with innumerable abscesses that have formed within the musculature, several of which are quite large. Diffuse swelling is noted, and there is noted significant destruction of the left femoral head which may be related to chronic septic arthritis or inflammation. Left hip effusion is noted. 3. Mild subcutaneous edema or inflammation is seen involving the visualized portions of the left upper thigh and hip region.  TTE 09/14/20 1. Left ventricular ejection fraction, by estimation, is 60 to 65%. The  left ventricle has normal function. The left ventricle has no regional  wall motion abnormalities. There is moderate concentric left ventricular  hypertrophy. Left ventricular  diastolic function could not be evaluated. Elevated left ventricular  end-diastolic pressure.  2. Right ventricular systolic function is normal. The right ventricular  size is normal. Tricuspid regurgitation signal is inadequate for assessing  PA pressure.  3. The mitral valve is normal in structure. No evidence of mitral valve  regurgitation. No evidence of mitral stenosis.  4. The aortic valve is normal in structure. Aortic valve regurgitation is  not visualized. No aortic stenosis is present.  5. The inferior vena cava is normal in size with greater than 50%  respiratory variability, suggesting right  atrial pressure of 3 mmHg.  6. Trivial pericardial effusion is present. The pericardial effusion is  Circumferential.   Micro Data:  2/22 Bld Cx - MSSA 2/23 Bld Cx >> 2/23 Abscess drainage Culture >> 2/24  MRSA Screen Negative  Antimicrobials:  Vancomycin 2/22 & 2/24 Cefepime 2/22  Tobramycin 2/24   Cefazolin 2/22 >>   Interim History / Subjective:  Hypotension overnight, currently on Levophed Has no significant complaints, some discomfort around site of surgery  Objective   Blood pressure 103/60, pulse (!) 101, temperature 98.2 F (36.8 C), temperature source Oral, resp. rate 19, height 5\' 10"  (1.778 m), weight 85.1 kg, SpO2 100 %.        Intake/Output Summary (Last 24 hours) at 09/16/2020 0805 Last data filed at 09/16/2020 0746 Gross per 24 hour  Intake 7201.55 ml  Output 3605 ml  Net 3596.55 ml   Filed Weights   09/13/20 0905 09/15/20 2111  Weight: 78.9 kg 85.1 kg    Examination: General: Young gentleman does not appear to be in distress HENT: Moist oral mucosa Lungs: Clear breath sounds bilaterally Cardiovascular: S1-S2 appreciated with no murmur Abdomen: Bowel sounds appreciated Extremities: No clubbing, no edema Neuro: Alert and oriented x3, moving all extremities GU: Good output  Resolved Hospital Problem list     Assessment & Plan:  Septic shock -MSSA bacteremia, septic left hip joint, multiple left hip abscesses -Continue Levophed and wean as tolerated -On cefazolin as per ID  Left hip septic arthritis, left iliopsoas abscess, left gluteal abscesses -S/p left hip arthroplasty with antibiotic spacer placement 2/24 -3 drains in place per IR in the ileopsoas and gluteal abscesses -Continue antibiotic-cefazolin per ID -Follow-up CT once limited drainage of less than 10 cc output per day\  Leukocytosis -Continue to trend -Continue current antibiotics  MSSA bacteremia -Blood cultures on 2/23 are negative to date -Continue cefazolin -Plan for TEE in the coming days  Blood loss anemia -Transfuse per protocol  Alcoholic hepatitis History of cirrhosis -Daily lactulose  History of hypertension/GERD -Usual medications  Generalized anxiety  disorder -On sertraline nightly  Best practice (evaluated daily)  Diet: Regular diet Pain/Anxiety/Delirium protocol (if indicated): Fentanyl as needed VAP protocol (if indicated): Not applicable DVT prophylaxis: SCD GI prophylaxis: Protonix Glucose control: We will monitor Mobility: As tolerated Disposition: ICU  Goals of Care:  Last date of multidisciplinary goals of care discussion: Family and staff present:  Summary of discussion:  Follow up goals of care discussion due:  Code Status: Full code  Labs   CBC: Recent Labs  Lab 09/13/20 1045 09/13/20 1821 09/14/20 0525 09/15/20 0450 09/15/20 1745 09/15/20 2052 09/16/20 0238  WBC 10.6*  --  8.3 12.8*  --  24.4* 31.6*  NEUTROABS 8.4*  --   --  10.2*  --   --  26.2*  HGB 6.2*   < > 7.6* 8.0* 8.8* 8.0* 6.4*  HCT 18.9*   < > 22.6* 24.4* 26.0* 24.0* 19.5*  MCV 106.2*  --  100.9* 103.4*  --  97.2 98.5  PLT 170  --  140* 139*  --  86* 84*   < > = values in this interval not displayed.    Basic Metabolic Panel: Recent Labs  Lab 09/13/20 1045 09/14/20 0525 09/15/20 0450 09/15/20 1745 09/15/20 2300 09/16/20 0238  NA 128* 131* 128* 130* 132* 133*  K 4.3 3.9 3.8 4.5 4.5 4.7  CL 94* 101 98  --  103 104  CO2 21* 22 20*  --  18*  17*  GLUCOSE 112* 139* 100*  --  105* 127*  BUN 17 16 12   --  16 16  CREATININE 0.59* 0.44* 0.54*  --  0.67 0.58*  CALCIUM 8.4* 7.8* 7.8*  --  7.1* 7.0*  MG  --   --  1.8  --  2.0 1.9  PHOS  --   --  3.2  --  4.0 4.4   GFR: Estimated Creatinine Clearance: 131.8 mL/min (A) (by C-G formula based on SCr of 0.58 mg/dL (L)). Recent Labs  Lab 09/13/20 1030 09/13/20 1045 09/13/20 1340 09/14/20 0525 09/15/20 0450 09/15/20 2052 09/15/20 2300 09/16/20 0238  PROCALCITON  --   --   --  3.00  --   --  21.56  --   WBC  --    < >  --  8.3 12.8* 24.4*  --  31.6*  LATICACIDVEN 3.3*  --  2.5*  --   --  5.0* 5.3*  --    < > = values in this interval not displayed.    Liver Function Tests: Recent  Labs  Lab 09/13/20 1045 09/14/20 0525 09/15/20 0450 09/15/20 2300  AST 40 30 42* 38  ALT 42 32 29 16  ALKPHOS 156* 130* 191* 123  BILITOT 2.3* 2.0* 1.5* 3.2*  PROT 6.7 6.0* 5.8* 4.6*  ALBUMIN 2.2* 2.0* 1.9* 2.1*   No results for input(s): LIPASE, AMYLASE in the last 168 hours. Recent Labs  Lab 09/13/20 1520  AMMONIA 45*    ABG    Component Value Date/Time   HCO3 22.4 09/15/2020 1745   TCO2 24 09/15/2020 1745   ACIDBASEDEF 6.0 (H) 09/15/2020 1745   O2SAT 64.0 09/15/2020 1745     Coagulation Profile: Recent Labs  Lab 09/13/20 1045 09/14/20 0525 09/15/20 2300  INR 1.4* 1.5* 2.2*    Cardiac Enzymes: No results for input(s): CKTOTAL, CKMB, CKMBINDEX, TROPONINI in the last 168 hours.  HbA1C: No results found for: HGBA1C  CBG: Recent Labs  Lab 09/15/20 2116 09/15/20 2152  GLUCAP 61* 86    Review of Systems:   Awake and alert, only complaint of pain at site of surgery  Past Medical History:  He,  has a past medical history of Alcohol abuse, Asthma, Depression, and Hypertension.   Surgical History:   Past Surgical History:  Procedure Laterality Date  . FRACTURE SURGERY       Social History:   reports that he has been smoking cigars. He has never used smokeless tobacco. He reports current alcohol use of about 12.0 - 18.0 standard drinks of alcohol per week. He reports current drug use. Drug: Marijuana.   Family History:  His family history is negative for Cirrhosis and Diabetes Mellitus II.   Allergies Allergies  Allergen Reactions  . Cashew Nut (Anacardium Occidentale) Skin Test Nausea And Vomiting    Allergy to cashews only; Tolerates other nuts  . Cashew Nut Oil Nausea And Vomiting    Allergy to cashews only; Tolerates other nuts    The patient is critically ill with multiple organ systems failure and requires high complexity decision making for assessment and support, frequent evaluation and titration of therapies, application of advanced  monitoring technologies and extensive interpretation of multiple databases. Critical Care Time devoted to patient care services described in this note independent of APP/resident time (if applicable)  is 30 minutes.   [id number] MD [facility] Personal pager: [phone] If unanswered, please page CCM On-call: #[phone]

## 2020-09-16 NOTE — Progress Notes (Signed)
Patient's chart was reviewed this morning and overnight events were noted.  Patient subsequently went into septic shock last night after his surgery and lactic acid trended up to 5.3.  He was transferred to the intensive care unit and placed on peripheral Levophed.  His white count continues to trend upwards but he he remains on IV cefazolin and was transfused 2 units of PRBCs per protocol given that his hemoglobin was 6.4 this morning.  I spoke with the critical care attending Dr. Wynona Neat and he will maintain the patient on his service this morning and likely transfer to [facility] tomorrow once his pressors are weaned.  TRH will resume care once he is stabilized and off of pressors.

## 2020-09-16 NOTE — Progress Notes (Signed)
Notified by Endo at [facility] that patient will be picked up by [facility] at 8am on Monday 09/19/2020 for TEE.

## 2020-09-16 NOTE — Progress Notes (Signed)
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  Repeat blood cultures from 2/23 are no growth to date.  Abscess fluid cultures from IR drain placement with abundant staph aureus (susceptibilities pending) Septic shock: Secondary to above and now off vasopressors EtOH cirrhosis and hepatitis: Was on prolonged steroid taper prior to admission and this was stopped 09/14/2020.  Patient also reports abstinence from alcohol for 30 days. Recent Covid infection: Recently admitted and treated with steroids and remdesivir.  Covid PCR negative this admission Leukocytosis: Significantly elevated postoperatively.  Afebrile  PLAN:    Continue cefazolin TEE planned for Monday Follow-up repeat blood cultures and abscess fluid cultures Monitor drain output.  Repeat imaging per IR based on this. Appreciate IR and orthopedic surgery recommendations  SUBJECTIVE:   24 hour events:  Status post left hip arthroplasty with antibiotic cement interposition spacer and left hip excisional debridement, skin, subcutaneous tissue, muscle, bone, deep abscess. Worsening hemodynamics overnight requiring vasopressors and ICU admission.  No acute complaints.  Postoperative  pain is controlled.  No fevers or chills.  Tolerating antibiotics.  Currently off vasopressors.     OBJECTIVE:   Blood pressure 124/78, pulse (!) 107, temperature 98.5 F (36.9 C), temperature source Axillary, resp. rate (!) 24, height 5\' 10"  (1.778 m), weight 85.1 kg, SpO2 100 %. Body mass index is 26.92 kg/m.  Physical Exam Constitutional:      Comments: Ill-appearing man, sitting up in hospital bed, no acute distress.  HENT:     Head: Normocephalic and atraumatic.  Cardiovascular:     Rate and Rhythm: Normal rate and regular rhythm.     Heart sounds: No murmur heard.   Pulmonary:     Effort: Pulmonary effort is normal. No respiratory distress.  Musculoskeletal:        General: Swelling and tenderness present.     Comments: Left leg with edema greater than the right.  Groin with erythema and 2 superficial blisters.  No significant tenderness to palpation.  No crepitus.  Surgical dressings in place.  Skin:    General: Skin is warm and dry.     Findings: Erythema present.  Neurological:     General: No focal deficit present.     Mental Status: He is oriented to person, place, and time.  Psychiatric:        Mood and Affect: Mood normal.        Behavior: Behavior normal.      Lab Results: Lab Results  Component Value Date   WBC 31.6 (H) 09/16/2020   HGB 7.9 (L) 09/16/2020   HCT 23.3 (L) 09/16/2020   MCV 98.5 09/16/2020   PLT 84 (L) 09/16/2020    Lab Results  Component Value Date   NA 133 (L) 09/16/2020   K 4.7  09/16/2020   CO2 17 (L) 09/16/2020   GLUCOSE 127 (H) 09/16/2020   BUN 16 09/16/2020   CREATININE 0.58 (L) 09/16/2020   CALCIUM 7.0 (L) 09/16/2020   GFRNONAA >60 09/16/2020   GFRAA >60 02/24/2019    Lab Results  Component Value Date   ALT 16 09/15/2020   AST 38 09/15/2020   ALKPHOS 123 09/15/2020   BILITOT 3.2 (H) 09/15/2020       Component Value Date/Time   CRP 12.0 (H) 08/27/2020 0454    No results found for: ESRSEDRATE   I have reviewed  the micro and lab results in Epic.  Imaging: DG Pelvis Portable  Result Date: 09/15/2020 CLINICAL DATA:  Status post total left hip arthroplasty. EXAM: PORTABLE PELVIS 1-2 VIEWS COMPARISON:  None. FINDINGS: A left hip replacement is seen without evidence of surrounding lucency. There is no evidence of pelvic fracture or diastasis. No pelvic bone lesions are seen. Radiopaque surgical drains are seen. A mild-to-moderate amount of soft tissue air is seen along the lateral aspect of the left hip. IMPRESSION: Status post left hip replacement. Electronically Signed   By: Aram Candela M.D.   On: 09/15/2020 20:20   DG Chest Port 1 View  Result Date: 09/15/2020 CLINICAL DATA:  Shock. EXAM: PORTABLE CHEST 1 VIEW COMPARISON:  September 13, 2020 FINDINGS: Mildly decreased lung volumes are seen which is likely secondary to the degree of patient inspiration. Very mild atelectasis and/or early infiltrate is noted within the right lung base. There is no evidence of a pleural effusion or pneumothorax. The heart size and mediastinal contours are within normal limits. The visualized skeletal structures are unremarkable. IMPRESSION: Very mild right basilar atelectasis and/or early infiltrate. Electronically Signed   By: Aram Candela M.D.   On: 09/15/2020 20:20   ECHOCARDIOGRAM COMPLETE  Result Date: 09/14/2020    ECHOCARDIOGRAM REPORT   Patient Name:   ROMIR KLIMOWICZ Date of Exam: 09/14/2020 Medical Rec #:  [id number]         Height:       70.0 in Accession #:    [id number]        Weight:       174.0 lb Date of Birth:  [birth year]         BSA:          1.967 m Patient Age:    34 years          BP:           116/82 mmHg Patient Gender: M                 HR:           118 bpm. Exam Location:  Inpatient Procedure: 2D Echo, Cardiac Doppler and Color Doppler Indications:    Bacteremia; Z01.818 Encounter for other preprocedural                 examination  History:        Patient has prior history of Echocardiogram  examinations, most                 recent 08/25/2020. Abnormal ECG, Signs/Symptoms:Shortness of                 Breath and Dyspnea; Risk Factors:Hypertension. ETOH. Covid                 infection.  Sonographer:    Sheralyn Boatman RDCS Referring Phys: [id number] [facility] LATIF [facility]  Sonographer Comments: Technically difficult  study due to poor echo windows. Patient has hip infection, in pain, could not move. High fowler's position. IMPRESSIONS  1. Left ventricular ejection fraction, by estimation, is 60 to 65%. The left ventricle has normal function. The left ventricle has no regional wall motion abnormalities. There is moderate concentric left ventricular hypertrophy. Left ventricular diastolic function could not be evaluated. Elevated left ventricular end-diastolic pressure.  2. Right ventricular systolic function is normal. The right ventricular size is normal. Tricuspid regurgitation signal is inadequate for assessing PA pressure.  3. The mitral valve is normal in structure. No evidence of mitral valve regurgitation. No evidence of mitral stenosis.  4. The aortic valve is normal in structure. Aortic valve regurgitation is not visualized. No aortic stenosis is present.  5. The inferior vena cava is normal in size with greater than 50% respiratory variability, suggesting right atrial pressure of 3 mmHg.  6. Trivial pericardial effusion is present. The pericardial effusion is circumferential. FINDINGS  Left Ventricle: Left ventricular ejection fraction, by estimation, is 60 to 65%. The left ventricle has normal function. The left ventricle has no regional wall motion abnormalities. The left ventricular internal cavity size was normal in size. There is  moderate concentric left ventricular hypertrophy. Left ventricular diastolic function could not be evaluated. Elevated left ventricular end-diastolic pressure. Right Ventricle: The right ventricular size is normal. No increase in right ventricular wall thickness. Right  ventricular systolic function is normal. Tricuspid regurgitation signal is inadequate for assessing PA pressure. Left Atrium: Left atrial size was normal in size. Right Atrium: Right atrial size was normal in size. Pericardium: Trivial pericardial effusion is present. The pericardial effusion is circumferential. Mitral Valve: The mitral valve is normal in structure. No evidence of mitral valve regurgitation. No evidence of mitral valve stenosis. Tricuspid Valve: The tricuspid valve is normal in structure. Tricuspid valve regurgitation is mild . No evidence of tricuspid stenosis. Aortic Valve: The aortic valve is normal in structure. Aortic valve regurgitation is not visualized. No aortic stenosis is present. Pulmonic Valve: The pulmonic valve was normal in structure. Pulmonic valve regurgitation is not visualized. No evidence of pulmonic stenosis. Aorta: The aortic root is normal in size and structure. Venous: The inferior vena cava is normal in size with greater than 50% respiratory variability, suggesting right atrial pressure of 3 mmHg. IAS/Shunts: No atrial level shunt detected by color flow Doppler.  LEFT VENTRICLE PLAX 2D LVIDd:         4.70 cm      Diastology LVIDs:         2.80 cm      LV e' medial:    3.81 cm/s LV PW:         1.50 cm      LV E/e' medial:  31.0 LV IVS:        1.40 cm      LV e' lateral:   6.64 cm/s LVOT diam:     2.30 cm      LV E/e' lateral: 17.8 LV SV:         78 LV SV Index:   39 LVOT Area:     4.15 cm  LV Volumes (MOD) LV vol d, MOD A2C: 69.7 ml LV vol d, MOD A4C: 115.0 ml LV vol s, MOD A2C: 25.9 ml LV vol s, MOD A4C: 47.1 ml LV SV MOD A2C:     43.8 ml LV SV MOD A4C:     115.0 ml LV SV MOD BP:  51.8 ml IVC IVC diam: 1.50 cm LEFT ATRIUM           Index       RIGHT ATRIUM           Index LA diam:      3.80 cm 1.93 cm/m  RA Area:     12.30 cm LA Vol (A2C): 39.5 ml 20.08 ml/m RA Volume:   23.60 ml  12.00 ml/m LA Vol (A4C): 32.6 ml 16.57 ml/m  AORTIC VALVE LVOT Vmax:   130.00 cm/s  LVOT Vmean:  86.000 cm/s LVOT VTI:    0.187 m  AORTA Ao Root diam: 3.30 cm Ao Asc diam:  3.20 cm MITRAL VALVE MV Area (PHT): 5.84 cm     SHUNTS MV Decel Time: 130 msec     Systemic VTI:  0.19 m MV E velocity: 118.00 cm/s  Systemic Diam: 2.30 cm Armanda Magicraci Turner MD Electronically signed by Armanda Magicraci Turner MD Signature Date/Time: 09/14/2020/4:26:40 PM    Final    DG Hip Port Unilat With Pelvis 1V Left  Result Date: 09/15/2020 CLINICAL DATA:  Status post left hip arthroplasty. EXAM: DG HIP (WITH OR WITHOUT PELVIS) 1V PORT LEFT COMPARISON:  None. FINDINGS: A left hip replacement is seen without evidence of surrounding lucency. There is no evidence of acute hip fracture or dislocation. There is no evidence of arthropathy or other focal bone abnormality. Multiple radiopaque surgical drains are seen. Radiopaque skin staples are also noted. IMPRESSION: Left hip replacement without evidence of hardware complication or fracture. Electronically Signed   By: Aram Candelahaddeus  Houston M.D.   On: 09/15/2020 20:21     Imaging independently reviewed in Epic.    Vedia CofferAndrew N Marykate [facility] [phone] pager 09/16/2020, 2:54 PM

## 2020-09-16 NOTE — Progress Notes (Signed)
    [facility] has been requested to perform a transesophageal echocardiogram on [name] for bacteremia.  After careful review of history and examination, the risks and benefits of transesophageal echocardiogram have been explained including risks of esophageal damage, perforation (1:10,000 risk), bleeding, pharyngeal hematoma as well as other potential complications associated with conscious sedation including aspiration, arrhythmia, respiratory failure and death. Alternatives to treatment were discussed, questions were answered. Patient is willing to proceed.   Nada Boozer, NP  09/16/2020 12:30 PM

## 2020-09-16 NOTE — Progress Notes (Signed)
PT Cancellation Note  Patient Details Name: BRODIE CORRELL MRN: [id number] DOB: 05/30/1986   Cancelled Treatment:    Reason Eval/Treat Not Completed: Pain limiting ability to participate, via RN communication w/ patient, pain limiting at this time. Will check back tomorrow.   Rada Hay 09/16/2020, 2:57 PM Blanchard Kelch PT Acute Rehabilitation Services Pager [phone] Office [phone]

## 2020-09-16 NOTE — Progress Notes (Addendum)
Subjective: 1 Day Post-Op s/p Procedure(s): TOTAL HIP ARTHROPLASTY WITH CEMENT INTERPOSITION SPACING   Patient is alert, oriented. Reports pain as improved since admission but continues to have moderate to severe left hip pain. Worse with movement.  Denies chest pain, SOB, Calf pain. No nausea/vomiting.    Objective:  PE: VITALS:   Vitals:   09/16/20 1225 09/16/20 1230 09/16/20 1300 09/16/20 1326  BP: (!) 144/88 (!) 145/84 (!) 144/83   Pulse: (!) 101 (!) 101 99 (!) 102  Resp: (!) 21 (!) 24 (!) 22 (!) 22  Temp: 98.7 F (37.1 C)     TempSrc: Axillary     SpO2: 100% 100% 97% 99%  Weight:      Height:       General: Alert, oriented, sitting up in bed. MSK: LLE edema at groin, thigh, and foot. Groin is erythematous. All compartments compressible. TTP to groin, thigh, lower leg. Dorsiflexion and plantarflexion intact. Foot warm and well perfused. Distal sensation intact. Surgical dressing intact with moderate drainage.  GU: catheter in place, scrotum appears edematous  LABS  Results for orders placed or performed during the hospital encounter of 09/13/20 (from the past 24 hour(s))  POCT I-Stat EG7     Status: Abnormal   Collection Time: 09/15/20  5:45 PM  Result Value Ref Range   pH, Ven 7.184 (LL) 7.250 - 7.430   pCO2, Ven 59.4 44.0 - 60.0 mmHg   pO2, Ven 42.0 32.0 - 45.0 mmHg   Bicarbonate 22.4 20.0 - 28.0 mmol/L   TCO2 24 22 - 32 mmol/L   O2 Saturation 64.0 %   Acid-base deficit 6.0 (H) 0.0 - 2.0 mmol/L   Sodium 130 (L) 135 - 145 mmol/L   Potassium 4.5 3.5 - 5.1 mmol/L   Calcium, Ion 1.20 1.15 - 1.40 mmol/L   HCT 26.0 (L) 39.0 - 52.0 %   Hemoglobin 8.8 (L) 13.0 - 17.0 g/dL   Sample type VENOUS   Lactic acid, plasma     Status: Abnormal   Collection Time: 09/15/20  8:52 PM  Result Value Ref Range   Lactic Acid, Venous 5.0 (HH) 0.5 - 1.9 mmol/L  Cortisol     Status: None   Collection Time: 09/15/20  8:52 PM  Result Value Ref Range   Cortisol, Plasma 27.8 ug/dL   CBC     Status: Abnormal   Collection Time: 09/15/20  8:52 PM  Result Value Ref Range   WBC 24.4 (H) 4.0 - 10.5 K/uL   RBC 2.47 (L) 4.22 - 5.81 MIL/uL   Hemoglobin 8.0 (L) 13.0 - 17.0 g/dL   HCT 40.9 (L) 81.1 - 91.4 %   MCV 97.2 80.0 - 100.0 fL   MCH 32.4 26.0 - 34.0 pg   MCHC 33.3 30.0 - 36.0 g/dL   RDW 78.2 (H) 95.6 - 21.3 %   Platelets 86 (L) 150 - 400 K/uL   nRBC 2.3 (H) 0.0 - 0.2 %  Glucose, capillary     Status: Abnormal   Collection Time: 09/15/20  9:16 PM  Result Value Ref Range   Glucose-Capillary 61 (L) 70 - 99 mg/dL   Comment 1 Notify RN   Glucose, capillary     Status: None   Collection Time: 09/15/20  9:52 PM  Result Value Ref Range   Glucose-Capillary 86 70 - 99 mg/dL  Comprehensive metabolic panel     Status: Abnormal   Collection Time: 09/15/20 11:00 PM  Result Value Ref Range  Sodium 132 (L) 135 - 145 mmol/L   Potassium 4.5 3.5 - 5.1 mmol/L   Chloride 103 98 - 111 mmol/L   CO2 18 (L) 22 - 32 mmol/L   Glucose, Bld 105 (H) 70 - 99 mg/dL   BUN 16 6 - 20 mg/dL   Creatinine, Ser 8.67 0.61 - 1.24 mg/dL   Calcium 7.1 (L) 8.9 - 10.3 mg/dL   Total Protein 4.6 (L) 6.5 - 8.1 g/dL   Albumin 2.1 (L) 3.5 - 5.0 g/dL   AST 38 15 - 41 U/L   ALT 16 0 - 44 U/L   Alkaline Phosphatase 123 38 - 126 U/L   Total Bilirubin 3.2 (H) 0.3 - 1.2 mg/dL   GFR, Estimated >61 >95 mL/min   Anion gap 11 5 - 15  Magnesium     Status: None   Collection Time: 09/15/20 11:00 PM  Result Value Ref Range   Magnesium 2.0 1.7 - 2.4 mg/dL  Phosphorus     Status: None   Collection Time: 09/15/20 11:00 PM  Result Value Ref Range   Phosphorus 4.0 2.5 - 4.6 mg/dL  Lactic acid, plasma     Status: Abnormal   Collection Time: 09/15/20 11:00 PM  Result Value Ref Range   Lactic Acid, Venous 5.3 (HH) 0.5 - 1.9 mmol/L  Procalcitonin     Status: None   Collection Time: 09/15/20 11:00 PM  Result Value Ref Range   Procalcitonin 21.56 ng/mL  Protime-INR     Status: Abnormal   Collection Time:  09/15/20 11:00 PM  Result Value Ref Range   Prothrombin Time 23.6 (H) 11.4 - 15.2 seconds   INR 2.2 (H) 0.8 - 1.2  Prepare fresh frozen plasma     Status: None (Preliminary result)   Collection Time: 09/15/20 11:59 PM  Result Value Ref Range   Unit Number [id number]    Blood Component Type THAWED PLASMA    Unit division 00    Status of Unit ISSUED    Transfusion Status      OK TO TRANSFUSE Performed at [facility], [address] [place], [place] [address]   CBC with Differential/Platelet     Status: Abnormal   Collection Time: 09/16/20  2:38 AM  Result Value Ref Range   WBC 31.6 (H) 4.0 - 10.5 K/uL   RBC 1.98 (L) 4.22 - 5.81 MIL/uL   Hemoglobin 6.4 (LL) 13.0 - 17.0 g/dL   HCT 25.0 (L) 53.9 - 76.7 %   MCV 98.5 80.0 - 100.0 fL   MCH 32.3 26.0 - 34.0 pg   MCHC 32.8 30.0 - 36.0 g/dL   RDW 34.1 (H) 93.7 - 90.2 %   Platelets 84 (L) 150 - 400 K/uL   nRBC 1.5 (H) 0.0 - 0.2 %   Neutrophils Relative % 83 %   Neutro Abs 26.2 (H) 1.7 - 7.7 K/uL   Lymphocytes Relative 6 %   Lymphs Abs 1.9 0.7 - 4.0 K/uL   Monocytes Relative 2 %   Monocytes Absolute 0.6 0.1 - 1.0 K/uL   Eosinophils Relative 0 %   Eosinophils Absolute 0.0 0.0 - 0.5 K/uL   Basophils Relative 0 %   Basophils Absolute 0.1 0.0 - 0.1 K/uL   WBC Morphology MILD LEFT SHIFT (1-5% METAS, OCC MYELO, OCC BANDS)    Immature Granulocytes 9 %   Abs Immature Granulocytes 2.76 (H) 0.00 - 0.07 K/uL   Polychromasia PRESENT   Magnesium     Status: None  Collection Time: 09/16/20  2:38 AM  Result Value Ref Range   Magnesium 1.9 1.7 - 2.4 mg/dL  Phosphorus     Status: None   Collection Time: 09/16/20  2:38 AM  Result Value Ref Range   Phosphorus 4.4 2.5 - 4.6 mg/dL  Basic metabolic panel     Status: Abnormal   Collection Time: 09/16/20  2:38 AM  Result Value Ref Range   Sodium 133 (L) 135 - 145 mmol/L   Potassium 4.7 3.5 - 5.1 mmol/L   Chloride 104 98 - 111 mmol/L   CO2 17 (L) 22 - 32 mmol/L   Glucose,  Bld 127 (H) 70 - 99 mg/dL   BUN 16 6 - 20 mg/dL   Creatinine, Ser 0.27 (L) 0.61 - 1.24 mg/dL   Calcium 7.0 (L) 8.9 - 10.3 mg/dL   GFR, Estimated >25 >36 mL/min   Anion gap 12 5 - 15  Prepare RBC (crossmatch)     Status: None   Collection Time: 09/16/20  3:30 AM  Result Value Ref Range   Order Confirmation      ORDER PROCESSED BY BLOOD BANK Performed at [facility], [address], [place], [place] [address]   Prepare Pheresed Platelets     Status: None (Preliminary result)   Collection Time: 09/16/20  3:30 AM  Result Value Ref Range   Unit Number [id number]    Blood Component Type PSORALEN TREATED    Unit division 00    Status of Unit ISSUED    Transfusion Status      OK TO TRANSFUSE Performed at [facility], [address] [place], [place] [address]     DG Pelvis Portable  Result Date: 09/15/2020 CLINICAL DATA:  Status post total left hip arthroplasty. EXAM: PORTABLE PELVIS 1-2 VIEWS COMPARISON:  None. FINDINGS: A left hip replacement is seen without evidence of surrounding lucency. There is no evidence of pelvic fracture or diastasis. No pelvic bone lesions are seen. Radiopaque surgical drains are seen. A mild-to-moderate amount of soft tissue air is seen along the lateral aspect of the left hip. IMPRESSION: Status post left hip replacement. Electronically Signed   By: Aram Candela M.D.   On: 09/15/2020 20:20   DG Chest Port 1 View  Result Date: 09/15/2020 CLINICAL DATA:  Shock. EXAM: PORTABLE CHEST 1 VIEW COMPARISON:  September 13, 2020 FINDINGS: Mildly decreased lung volumes are seen which is likely secondary to the degree of patient inspiration. Very mild atelectasis and/or early infiltrate is noted within the right lung base. There is no evidence of a pleural effusion or pneumothorax. The heart size and mediastinal contours are within normal limits. The visualized skeletal structures are unremarkable. IMPRESSION: Very mild right  basilar atelectasis and/or early infiltrate. Electronically Signed   By: Aram Candela M.D.   On: 09/15/2020 20:20   ECHOCARDIOGRAM COMPLETE  Result Date: 09/14/2020    ECHOCARDIOGRAM REPORT   Patient Name:   [name] Date of Exam: 09/14/2020 Medical Rec #:  [id number]         Height:       70.0 in Accession #:    [id number]        Weight:       174.0 lb Date of Birth:  Jun 23, 1986         BSA:          1.967 m Patient Age:    34 years          BP:  116/82 mmHg Patient Gender: M                 HR:           118 bpm. Exam Location:  Inpatient Procedure: 2D Echo, Cardiac Doppler and Color Doppler Indications:    Bacteremia; Z01.818 Encounter for other preprocedural                 examination  History:        Patient has prior history of Echocardiogram examinations, most                 recent 08/25/2020. Abnormal ECG, Signs/Symptoms:Shortness of                 Breath and Dyspnea; Risk Factors:Hypertension. ETOH. Covid                 infection.  Sonographer:    Sheralyn Boatmanina West RDCS Referring Phys: [id number] [facility]MA[facility] LATIF [facility]  Sonographer Comments: Technically difficult study due to poor echo windows. Patient has hip infection, in pain, could not move. High fowler's position. IMPRESSIONS  1. Left ventricular ejection fraction, by estimation, is 60 to 65%. The left ventricle has normal function. The left ventricle has no regional wall motion abnormalities. There is moderate concentric left ventricular hypertrophy. Left ventricular diastolic function could not be evaluated. Elevated left ventricular end-diastolic pressure.  2. Right ventricular systolic function is normal. The right ventricular size is normal. Tricuspid regurgitation signal is inadequate for assessing PA pressure.  3. The mitral valve is normal in structure. No evidence of mitral valve regurgitation. No evidence of mitral stenosis.  4. The aortic valve is normal in structure. Aortic valve regurgitation is not visualized. No aortic  stenosis is present.  5. The inferior vena cava is normal in size with greater than 50% respiratory variability, suggesting right atrial pressure of 3 mmHg.  6. Trivial pericardial effusion is present. The pericardial effusion is circumferential. FINDINGS  Left Ventricle: Left ventricular ejection fraction, by estimation, is 60 to 65%. The left ventricle has normal function. The left ventricle has no regional wall motion abnormalities. The left ventricular internal cavity size was normal in size. There is  moderate concentric left ventricular hypertrophy. Left ventricular diastolic function could not be evaluated. Elevated left ventricular end-diastolic pressure. Right Ventricle: The right ventricular size is normal. No increase in right ventricular wall thickness. Right ventricular systolic function is normal. Tricuspid regurgitation signal is inadequate for assessing PA pressure. Left Atrium: Left atrial size was normal in size. Right Atrium: Right atrial size was normal in size. Pericardium: Trivial pericardial effusion is present. The pericardial effusion is circumferential. Mitral Valve: The mitral valve is normal in structure. No evidence of mitral valve regurgitation. No evidence of mitral valve stenosis. Tricuspid Valve: The tricuspid valve is normal in structure. Tricuspid valve regurgitation is mild . No evidence of tricuspid stenosis. Aortic Valve: The aortic valve is normal in structure. Aortic valve regurgitation is not visualized. No aortic stenosis is present. Pulmonic Valve: The pulmonic valve was normal in structure. Pulmonic valve regurgitation is not visualized. No evidence of pulmonic stenosis. Aorta: The aortic root is normal in size and structure. Venous: The inferior vena cava is normal in size with greater than 50% respiratory variability, suggesting right atrial pressure of 3 mmHg. IAS/Shunts: No atrial level shunt detected by color flow Doppler.  LEFT VENTRICLE PLAX 2D LVIDd:         4.70  cm  Diastology LVIDs:         2.80 cm      LV e' medial:    3.81 cm/s LV PW:         1.50 cm      LV E/e' medial:  31.0 LV IVS:        1.40 cm      LV e' lateral:   6.64 cm/s LVOT diam:     2.30 cm      LV E/e' lateral: 17.8 LV SV:         78 LV SV Index:   39 LVOT Area:     4.15 cm  LV Volumes (MOD) LV vol d, MOD A2C: 69.7 ml LV vol d, MOD A4C: 115.0 ml LV vol s, MOD A2C: 25.9 ml LV vol s, MOD A4C: 47.1 ml LV SV MOD A2C:     43.8 ml LV SV MOD A4C:     115.0 ml LV SV MOD BP:      51.8 ml IVC IVC diam: 1.50 cm LEFT ATRIUM           Index       RIGHT ATRIUM           Index LA diam:      3.80 cm 1.93 cm/m  RA Area:     12.30 cm LA Vol (A2C): 39.5 ml 20.08 ml/m RA Volume:   23.60 ml  12.00 ml/m LA Vol (A4C): 32.6 ml 16.57 ml/m  AORTIC VALVE LVOT Vmax:   130.00 cm/s LVOT Vmean:  86.000 cm/s LVOT VTI:    0.187 m  AORTA Ao Root diam: 3.30 cm Ao Asc diam:  3.20 cm MITRAL VALVE MV Area (PHT): 5.84 cm     SHUNTS MV Decel Time: 130 msec     Systemic VTI:  0.19 m MV E velocity: 118.00 cm/s  Systemic Diam: 2.30 cm Armanda Magic MD Electronically signed by Armanda Magic MD Signature Date/Time: 09/14/2020/4:26:40 PM    Final    DG Hip Port Unilat With Pelvis 1V Left  Result Date: 09/15/2020 CLINICAL DATA:  Status post left hip arthroplasty. EXAM: DG HIP (WITH OR WITHOUT PELVIS) 1V PORT LEFT COMPARISON:  None. FINDINGS: A left hip replacement is seen without evidence of surrounding lucency. There is no evidence of acute hip fracture or dislocation. There is no evidence of arthropathy or other focal bone abnormality. Multiple radiopaque surgical drains are seen. Radiopaque skin staples are also noted. IMPRESSION: Left hip replacement without evidence of hardware complication or fracture. Electronically Signed   By: Aram Candela M.D.   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)  Septic arthritis of left hip, left iliopsoas abscess, left gluteal abscess 1 Day Post-Op s/p Procedure(s): TOTAL HIP ARTHROPLASTY WITH CEMENT INTERPOSITION SPACING Weightbearing: WBAT LLE Insicional and dressing care: Reinforce dressings as needed VTE prophylaxis: will defer to primary due to coagulopathy Pain control: continue current regimen - IR recommend follow up CT when drainage less than 10 cc per day   Contact information:   Weekdays 8-5 Janine Ores, PA-C [phone] A fter hours and holidays please check [email or web address] for group call information for [facility]  Armida Sans 09/16/2020, 2:06 PM

## 2020-09-16 NOTE — Progress Notes (Signed)
NUTRITION NOTE  Secure chat message received from RN who reports that patient does not care for Ensure and that PTA he was drinking, and likes, Fairlife Protein shakes. His girlfriend has been visiting him.  Informed RN that RD will d/c Ensure and to encourage girlfriend to bring in Fairlife shakes as patient is on Regular diet and because it is something patient knows he enjoys.      Trenton Gammon, MS, RD, LDN, CNSC Inpatient Clinical Dietitian RD pager # available in AMION  After hours/weekend pager # available in [facility]

## 2020-09-16 NOTE — Progress Notes (Signed)
eLink Physician-Brief Progress Note Patient Name: [name] DOB: 1986-04-10 MRN: [id number]   Date of Service  09/16/2020  HPI/Events of Note  Hemoglobin 6.4 gm / dl.  eICU Interventions  Dr. Francine Graven ordered 2 units of PRBC.        [name] 09/16/2020, 4:08 AM

## 2020-09-17 ENCOUNTER — Inpatient Hospital Stay (HOSPITAL_COMMUNITY): Payer: 59

## 2020-09-17 DIAGNOSIS — K6812 Psoas muscle abscess: Secondary | ICD-10-CM | POA: Diagnosis not present

## 2020-09-17 DIAGNOSIS — K7010 Alcoholic hepatitis without ascites: Secondary | ICD-10-CM | POA: Diagnosis not present

## 2020-09-17 DIAGNOSIS — K7030 Alcoholic cirrhosis of liver without ascites: Secondary | ICD-10-CM | POA: Diagnosis not present

## 2020-09-17 DIAGNOSIS — L02416 Cutaneous abscess of left lower limb: Secondary | ICD-10-CM | POA: Diagnosis not present

## 2020-09-17 LAB — COMPREHENSIVE METABOLIC PANEL
ALT: 16 U/L (ref 0–44)
AST: 43 U/L — ABNORMAL HIGH (ref 15–41)
Albumin: 2 g/dL — ABNORMAL LOW (ref 3.5–5.0)
Alkaline Phosphatase: 165 U/L — ABNORMAL HIGH (ref 38–126)
Anion gap: 7 (ref 5–15)
BUN: 13 mg/dL (ref 6–20)
CO2: 22 mmol/L (ref 22–32)
Calcium: 7.2 mg/dL — ABNORMAL LOW (ref 8.9–10.3)
Chloride: 102 mmol/L (ref 98–111)
Creatinine, Ser: 0.38 mg/dL — ABNORMAL LOW (ref 0.61–1.24)
GFR, Estimated: >60 mL/min (ref >60)
Glucose, Bld: 127 mg/dL — ABNORMAL HIGH (ref 70–99)
Potassium: 4.3 mmol/L (ref 3.5–5.1)
Sodium: 131 mmol/L — ABNORMAL LOW (ref 135–145)
Total Bilirubin: 1.3 mg/dL — ABNORMAL HIGH (ref 0.3–1.2)
Total Protein: 4.9 g/dL — ABNORMAL LOW (ref 6.5–8.1)

## 2020-09-17 LAB — BPAM RBC
Blood Product Expiration Date: 202203062359
Blood Product Expiration Date: 202203102359
Blood Product Expiration Date: 202203122359
Blood Product Expiration Date: 202203222359
Blood Product Expiration Date: 202203222359
Blood Product Expiration Date: 202203252359
ISSUE DATE / TIME: 202202221244
ISSUE DATE / TIME: 202202221957
ISSUE DATE / TIME: 202202241644
ISSUE DATE / TIME: 202202241644
ISSUE DATE / TIME: 202202250624
ISSUE DATE / TIME: 202202250920
Unit Type and Rh: 9500
Unit Type and Rh: 9500
Unit Type and Rh: 9500
Unit Type and Rh: 9500
Unit Type and Rh: 9500
Unit Type and Rh: 9500

## 2020-09-17 LAB — TYPE AND SCREEN
ABO/RH(D): O NEG
Antibody Screen: NEGATIVE
Unit division: 0
Unit division: 0
Unit division: 0
Unit division: 0
Unit division: 0
Unit division: 0

## 2020-09-17 LAB — BPAM FFP
Blood Product Expiration Date: 202203012359
ISSUE DATE / TIME: 202202250125
Unit Type and Rh: 5100

## 2020-09-17 LAB — PREPARE FRESH FROZEN PLASMA: Unit division: 0

## 2020-09-17 LAB — CBC WITH DIFFERENTIAL/PLATELET
Abs Immature Granulocytes: 0.8 10*3/uL — ABNORMAL HIGH (ref 0.00–0.07)
Basophils Absolute: 0.1 10*3/uL (ref 0.0–0.1)
Basophils Relative: 1 %
Eosinophils Absolute: 0 10*3/uL (ref 0.0–0.5)
Eosinophils Relative: 0 %
HCT: 22.5 % — ABNORMAL LOW (ref 39.0–52.0)
Hemoglobin: 7.5 g/dL — ABNORMAL LOW (ref 13.0–17.0)
Immature Granulocytes: 7 %
Lymphocytes Relative: 10 %
Lymphs Abs: 1.2 10*3/uL (ref 0.7–4.0)
MCH: 32.2 pg (ref 26.0–34.0)
MCHC: 33.3 g/dL (ref 30.0–36.0)
MCV: 96.6 fL (ref 80.0–100.0)
Monocytes Absolute: 0.4 10*3/uL (ref 0.1–1.0)
Monocytes Relative: 4 %
Neutro Abs: 9.6 10*3/uL — ABNORMAL HIGH (ref 1.7–7.7)
Neutrophils Relative %: 78 %
Platelets: 72 10*3/uL — ABNORMAL LOW (ref 150–400)
RBC: 2.33 MIL/uL — ABNORMAL LOW (ref 4.22–5.81)
RDW: 17.7 % — ABNORMAL HIGH (ref 11.5–15.5)
WBC: 12.1 10*3/uL — ABNORMAL HIGH (ref 4.0–10.5)
nRBC: 1.1 % — ABNORMAL HIGH (ref 0.0–0.2)

## 2020-09-17 LAB — PREPARE PLATELET PHERESIS: Unit division: 0

## 2020-09-17 LAB — BPAM PLATELET PHERESIS
Blood Product Expiration Date: 202202262359
ISSUE DATE / TIME: 202202251206
Unit Type and Rh: 5100

## 2020-09-17 LAB — MAGNESIUM: Magnesium: 2 mg/dL (ref 1.7–2.4)

## 2020-09-17 LAB — PHOSPHORUS: Phosphorus: 1.6 mg/dL — ABNORMAL LOW (ref 2.5–4.6)

## 2020-09-17 MED ORDER — MAGNESIUM SULFATE 2 GM/50ML IV SOLN
2.0000 g | Freq: Once | INTRAVENOUS | Status: AC
  Administered 2020-09-17: 2 g via INTRAVENOUS
  Filled 2020-09-17: qty 50

## 2020-09-17 MED ORDER — FUROSEMIDE 10 MG/ML IJ SOLN
40.0000 mg | Freq: Once | INTRAMUSCULAR | Status: AC
  Administered 2020-09-17: 40 mg via INTRAVENOUS
  Filled 2020-09-17: qty 4

## 2020-09-17 MED ORDER — K PHOS MONO-SOD PHOS DI & MONO 155-852-130 MG PO TABS
500.0000 mg | ORAL_TABLET | Freq: Two times a day (BID) | ORAL | Status: AC
  Administered 2020-09-17 – 2020-09-18 (×2): 500 mg via ORAL
  Filled 2020-09-17 (×2): qty 2

## 2020-09-17 NOTE — Progress Notes (Signed)
Has been off pressors  No complaints  CCM will sign off

## 2020-09-17 NOTE — Progress Notes (Signed)
[facility] (OTS)  2 Days Post-Op Procedure(s) (LRB): TOTAL HIP ARTHROPLASTY WITH CEMENT INTERPOSITION SPACING (Left)  Subjective: Patient reports pain as moderate.    Objective: Current Vitals Blood pressure (!) 129/93, pulse (!) 104, temperature 97.9 F (36.6 C), temperature source Oral, resp. rate (!) 24, height 5\' 10"  (1.778 m), weight 85.1 kg, SpO2 99 %. Vital signs in last 24 hours: Temp:  [97.7 F (36.5 C)-98.7 F (37.1 C)] 97.9 F (36.6 C) (02/26 1200) Pulse Rate:  [94-129] 104 (02/26 1500) Resp:  [14-40] 24 (02/26 1500) BP: (123-146)/(79-101) 129/93 (02/26 1300) SpO2:  [96 %-100 %] 99 % (02/26 1500)  Intake/Output from previous day: 02/25 0701 - 02/26 0700 In: 2176.5 [I.V.:883.1; Blood:1081; IV Piggyback:197.3] Out: 733 [Urine:700; Drains:33]  LABS Recent Labs    09/15/20 1745 09/15/20 2052 09/16/20 0238 09/16/20 1419 09/17/20 0511  HGB 8.8* 8.0* 6.4* 7.9* 7.5*   Recent Labs    09/16/20 0238 09/16/20 1419 09/17/20 0511  WBC 31.6*  --  12.1*  RBC 1.98*  --  2.33*  HCT 19.5* 23.3* 22.5*  PLT 84*  --  72*   Recent Labs    09/16/20 0238 09/17/20 0511  NA 133* 131*  K 4.7 4.3  CL 104 102  CO2 17* 22  BUN 16 13  CREATININE 0.58* 0.38*  GLUCOSE 127* 127*  CALCIUM 7.0* 7.2*   Recent Labs    09/15/20 2300  INR 2.2*     Physical Exam LLE  Dressing intact, clean, saturated  Edema/ swelling moderate  Sens: DPN, SPN, TN intact  Motor: EHL, FHL, and lessor toe ext and flex all intact grossly  Brisk cap refill, warm to touch, PT 2+  Assessment/Plan: 2 Days Post-Op Procedure(s) (LRB): TOTAL HIP ARTHROPLASTY WITH CEMENT INTERPOSITION SPACING (Left) 1. PT/OT working with them now 2. DVT proph Other (comment) per primary, mechanical only currently 3. Dressing changes  09/17/20, MD [facility], [facility] [phone]

## 2020-09-17 NOTE — Progress Notes (Signed)
PROGRESS NOTE    [name]  INO:[id number] DOB: [birth year] DOA: 09/13/2020 PCP: [name], PA   Brief Narrative:  HPI per Dr. Cherylann Ratel on 09/13/20 [name] is a 35 y.o. male with medical history significant of AVN of hip, HTN, GERD. Presenting with fatigue and left leg pain. Recent admission for COVID (08/22/20). Successfully completed therapy and was discharged on 08/29/20. At that time he was discharged to treatment for alcoholic hepatitis as well. He was ok for a few days after discharge, but he began to feel very tired and weak. He did not try any medicines to help. He tried rest. This progressed over these last 2 weeks. Also during this time, he began having left thigh and leg pain. He reports that the left thigh had become increasingly swollen and tender to touch. He has had a blister rupture on the inner thigh in the groin area. These symptoms increased through 2 days ago when he was no longer ago to walk without great pain. He saw his PCP yesterday who drew labs. He was called this morning by that team and advised to go to the ED immediately. He denies any other aggravating or alleviating factors.   ED Course: He was found to have a Hgb of 6.2. pRBCs were ordered. He was FOBT negative. CT imaging showed left iliopsoas and left gluteal abscesses w/ diffuse swelling and left hip effusion. CCS was consulted. Patient was started on cefazolin and vanc. TRH was called for admission.   **Interim History  Patient now has an MSSA bacteremia so ID was consulted and blood cultures repeated an echocardiogram was obtained.  Because of his abscesses and his iliopsoas muscle and multiple normal abscesses in the legs he underwent 3 drain placement by IR.  Orthopedic surgery is evaluating and planning for surgical intervention 09/15/20.  Blood count is improved after 2 units of PRBCs and he will be getting more units today for his Left Hip Interposition Arthoplasty today.  Blood cultures  are no growth to date so far and he continues to be on IV cefazolin however infectious diseases recommending a TEE so we will contact cardiology to set this up will be done per their scheduling  After his left hip total plasty with antibiotic spacer placement and debridement he had septic shock and decompensated and had hypotension despite 2 unit PRBCs, 2.2 L of crystalloid and 500 mL of albumin.  He was admitted to the ICU and started on peripheral pressors and critical care assumed care for 09/16/2020 and now is back on [facility] service.  He appears volume overloaded from all the fluid that he is gotten and has hepatic cirrhosis so likely has some anasarca so we will give him a dose of IV Lasix today.  Assessment & Plan:   Principal Problem:   MSSA bacteremia Active Problems:   Alcohol dependence ([place])   Alcoholic hepatitis   Sepsis ([place])   Septic arthritis ([place])   Iliopsoas abscess on left ([place])  Iliopsoas and left gluteal abscesses associated with MSSA Bacteremia  Septic shock secondary to above and left hip septic arthritis Left hip effusion and septic arthritis in the setting of avascular necrosis -Admitted to Inpatient Telemetry  -Initially started on IV vanc, cefepime; pharmacy to dose but changed to IV Cefazolin by ID -Repeat Blood Cx x2 and obtain ECHO; See below -Initial Blood Cx 09/13/20 showed Staph Aureus in Both sets of Cx's -Fluids had initially stopped but then he received 2.2 crystalloid boluses as  well as 2 units of PRBCs postoperatively -Sepsis Criteria met with: tachycardia, tachypnea, lactic acid 3.3, and Leukocytosis was 10.6 source: abscesses as above as well as bacteremia and went into septic shock during this admission -PCT was 3.00 5 6 on the day of surgery -WBC has gone from 10.6 -> 8.3 -> 12.8 and went to 31.6 yesterday and today is 12.1 -General surgery recs IR involvement for possible abscess drainage and for ortho to review (CCS consulted IR, EDP has consulted  ortho); appreciate assistance, will await further recs -Surgery had planned a total hip replacement early March and plan is to currently drain iliopsoas abscess today and regular pulse the patient for a left hip interposition arthroplasty tomorrow with Dr. Mardelle Matte and will continue antibiotics and they will make the patient n.p.o. at midnight today -Interventional radiology 3 drains in place today via CT-guided placement with a 14 French drainage catheter into the left retroperitoneal abscess into 12 French drains in the medial and lateral abscesses with the anterior aspect of left thigh and there is a total of 1.3 L of purulent fluid that was aspirated following drain placement sent for evaluation -Interventional Radiology evaluated the drains and they are recommending continue drain management and continue every shift flushes and monitoring of output; he is going to go to the OR today and if the drains are remain and the following plan for this is to repeat a CT scan when his drain output is less than 10 cc/day to assess for possible removal -Drain Gram Stain showing Moderate WBC, Abundant Gram Positive Cocci, and Abundant Gram Negative Rods with Cultures showing abundant Staph Aureus which is pan sensitive -Infectious diseases consulted and recommending changing antibiotic to cefazolin and following repeat echocardiogram; ECHO as below did not mention any vegetation -Repeat blood cultures have been done and show NGTD <24 ours and if TTE is negative they can recommend a transesophageal echocardiogram then but since his TTE was a difficult Study, ID Dr. Juleen China recommending TEE given Disseminated MSSA infection so will discuss with Cardiology to do an this will be done per cardiology scheduling and likely this will be done Monday, 09/19/2020. -Continue PT OT evaluation and continue dressing changes per orthopedic surgery and VT prophylaxis further recommendations but currently this is held given his anemia and  thrombocytopenia  Symptomatic Anemia Macrocytic Anemia -Presented with Hgb/Hct of 6.2/18.9 -Patient is s/p 2 units pRBCs and improved but once he went to the OR postoperatively he dropped to 6.4/90.5 and then was given 2 units of PRBCs and trended up to 7.9/20.3 and now is 7.5/22.5 today -FOBT negative -Anemia panel done and showed an iron level of 44, U IBC 131, TIBC 175, saturation ratios of 25% -check LDH and was 308, haptoglobin pending, iron studies showed an iron level of 44, U IBC 131, TIBC 175, saturation ratios of 25% -Continue to monitor for signs and symptoms of bleeding; currently no overt bleeding noted next- -repeat CBC in a.m.  Hx of Alcoholic Hepatitis Hepatic cirrhosis Hyperbilirubinemia, improving Mildly abnormal AST -Was continiung Prednisolone (he is on 51m through 09/26/20 and then tapers) but given his disseminated MSSA bacteremia will discontinue taper given LFTs being improved and because T Bili Trending down and is gone from 3.2 yesterday and is now 1.3 today. I spoke with Dr. KTherisa Doyneof Gastroenterology who feels the patient does not need taper now that this has been discontinued -Patient and family report he has been alcohol free for 1 month -Continue lactulose 10 g p.o. twice  daily, thiamine, magnesium -Patient's ammonia level was 45 yesterday not repeated today but will repeat again in the morning as well as continuing to monitor his PT and INR but this was not done today -Patient was given IV fluid hydration with LR and will stop given his hepatic cirrhosis and poor albumin status -AST is slightly elevated at 43 -Continue monitor hepatic function daily and if worsening will call GI for further assistance but at this time will discontinue the prednisone taper   Hx of Tachycardia HTN  -Baseline heart rate is anywhere between 100 110 per family -Continue Carvedilol 3.125 mg p.o. twice daily  GAD -Continue sertraline 25 mg p.o. nightly  GERD -C/w  Pantoprazole 40 mg p.o. twice daily  Hyponatremia -Was initiated on IV fluids with normal saline but this is now stopped  -Sodium is now 131 and trended down from 133 and could be from hypervolemia and his volume status being positive -We will give a dose of IV Lasix -Continue to monitor and trend and repeat CMP in a.m.  Metabolic Acidosis -Mild and is now improved -Patient CO2 is now 22, anion gap is 7, chloride level is 102 -IV fluid hydration was resumed by the ICU team given his septic shock but will now discontinue given his volume overload and anasarca -Continue to monitor and trend and repeat CMP in a.m.  Hypophosphatemia -Patient's phosphorus level is 1.6 today -Replete with p.o. K-Phos Neutral 500 mg p.o. twice daily x2 doses  -Continue to monitor and replete as necessary -Repeat CBC in a.m.  Scrotal Edema and Anasarca -Patient is positive for 4.617 L since admission -In the setting of his hepatic cirrhosis and IV fluid resuscitation-we will stop IV fluids and recommend scrotal elevation and sling and obtain a scrotal ultrasound and Doppler to rule out any pathology -Continue Foley for today and will give a dose of IV Lasix 40 mg x 1 today -Trial of void early in the morning and discontinue Foley at that time  Thrombocytopenia  -Patient's platelet count had dropped from 170,000 and could be in the setting of his cirrhosis and surgical issues and he is to count is now 72,000 -Continue to monitor for signs and symptoms of bleeding; currently no overt bleeding noted -Repeat CBC in a.m.  DVT prophylaxis: SCDs Code Status: FULL CODE  Family Communication: Discussed with family at bedside Disposition Plan: Pending further clinical improvement and evaluation and clearance by specialist  Status is: Inpatient  Remains inpatient appropriate because:Unsafe d/c plan, IV treatments appropriate due to intensity of illness or inability to take PO and Inpatient level of care  appropriate due to severity of illness   Dispo: The patient is from: Home              Anticipated d/c is to: TBD; PT OT to evaluate and treat              Anticipated d/c date is: 3 days              Patient currently is not medically stable to d/c.   Difficult to place patient No   Consultants:   General Surgery  Orthopedic Surgery  Infectious Diseases  Interventional Radiology  Case was discussed with Gastroenterology    Procedures:  ECHOCARDIOGRAM IMPRESSIONS    1. Left ventricular ejection fraction, by estimation, is 60 to 65%. The  left ventricle has normal function. The left ventricle has no regional  wall motion abnormalities. There is moderate concentric left ventricular  hypertrophy.  Left ventricular  diastolic function could not be evaluated. Elevated left ventricular  end-diastolic pressure.  2. Right ventricular systolic function is normal. The right ventricular  size is normal. Tricuspid regurgitation signal is inadequate for assessing  PA pressure.  3. The mitral valve is normal in structure. No evidence of mitral valve  regurgitation. No evidence of mitral stenosis.  4. The aortic valve is normal in structure. Aortic valve regurgitation is  not visualized. No aortic stenosis is present.  5. The inferior vena cava is normal in size with greater than 50%  respiratory variability, suggesting right atrial pressure of 3 mmHg.  6. Trivial pericardial effusion is present. The pericardial effusion is  circumferential.   FINDINGS  Left Ventricle: Left ventricular ejection fraction, by estimation, is 60  to 65%. The left ventricle has normal function. The left ventricle has no  regional wall motion abnormalities. The left ventricular internal cavity  size was normal in size. There is  moderate concentric left ventricular hypertrophy. Left ventricular  diastolic function could not be evaluated. Elevated left ventricular  end-diastolic pressure.    Right Ventricle: The right ventricular size is normal. No increase in  right ventricular wall thickness. Right ventricular systolic function is  normal. Tricuspid regurgitation signal is inadequate for assessing PA  pressure.   Left Atrium: Left atrial size was normal in size.   Right Atrium: Right atrial size was normal in size.   Pericardium: Trivial pericardial effusion is present. The pericardial  effusion is circumferential.   Mitral Valve: The mitral valve is normal in structure. No evidence of  mitral valve regurgitation. No evidence of mitral valve stenosis.   Tricuspid Valve: The tricuspid valve is normal in structure. Tricuspid  valve regurgitation is mild . No evidence of tricuspid stenosis.   Aortic Valve: The aortic valve is normal in structure. Aortic valve  regurgitation is not visualized. No aortic stenosis is present.   Pulmonic Valve: The pulmonic valve was normal in structure. Pulmonic valve  regurgitation is not visualized. No evidence of pulmonic stenosis.   Aorta: The aortic root is normal in size and structure.   Venous: The inferior vena cava is normal in size with greater than 50%  respiratory variability, suggesting right atrial pressure of 3 mmHg.   IAS/Shunts: No atrial level shunt detected by color flow Doppler.     LEFT VENTRICLE  PLAX 2D  LVIDd:     4.70 cm   Diastology  LVIDs:     2.80 cm   LV e' medial:  3.81 cm/s  LV PW:     1.50 cm   LV E/e' medial: 31.0  LV IVS:    1.40 cm   LV e' lateral:  6.64 cm/s  LVOT diam:   2.30 cm   LV E/e' lateral: 17.8  LV SV:     78  LV SV Index:  39  LVOT Area:   4.15 cm    LV Volumes (MOD)  LV vol d, MOD A2C: 69.7 ml  LV vol d, MOD A4C: 115.0 ml  LV vol s, MOD A2C: 25.9 ml  LV vol s, MOD A4C: 47.1 ml  LV SV MOD A2C:   43.8 ml  LV SV MOD A4C:   115.0 ml  LV SV MOD BP:   51.8 ml   IVC  IVC diam: 1.50 cm   LEFT ATRIUM      Index     RIGHT ATRIUM      Index  LA diam:  3.80 cm 1.93 cm/m RA Area:   12.30 cm  LA Vol (A2C): 39.5 ml 20.08 ml/m RA Volume:  23.60 ml 12.00 ml/m  LA Vol (A4C): 32.6 ml 16.57 ml/m  AORTIC VALVE  LVOT Vmax:  130.00 cm/s  LVOT Vmean: 86.000 cm/s  LVOT VTI:  0.187 m    AORTA  Ao Root diam: 3.30 cm  Ao Asc diam: 3.20 cm   MITRAL VALVE  MV Area (PHT): 5.84 cm   SHUNTS  MV Decel Time: 130 msec   Systemic VTI: 0.19 m  MV E velocity: 118.00 cm/s Systemic Diam: 2.30 cm   DRAIN PLACEMENT  Septic effusion of the left hip and associated RP and thigh abscesses Post procedural Dx: Same  Technically successful [place] and CT guided placement of a 14 Fr drainage catheter into left retroperitoneal abscess and two 12 Fr drains into medial and lateral abscesses within the anterior aspect of the left thigh.  A total of 1.3 L of purulent fluid was aspirated following drain placement. A representative aspirated sample was capped and sent to the laboratory for analysis.    Antimicrobials:  Anti-infectives (From admission, onward)   Start     Dose/Rate Route Frequency Ordered Stop   09/15/20 1715  tobramycin (NEBCIN) powder  Status:  Discontinued          As needed 09/15/20 1715 09/15/20 2043   09/15/20 1715  vancomycin (VANCOCIN) powder  Status:  Discontinued          As needed 09/15/20 1716 09/15/20 2043   09/14/20 0600  ceFAZolin (ANCEF) IVPB 2g/100 mL premix        2 g 200 mL/hr over 30 Minutes Intravenous Every 8 hours 09/14/20 0359     09/13/20 2200  vancomycin (VANCOREADY) IVPB 1500 mg/300 mL  Status:  Discontinued        1,500 mg 150 mL/hr over 120 Minutes Intravenous Every 12 hours 09/13/20 1636 09/14/20 0359   09/13/20 1500  ceFEPIme (MAXIPIME) 2 g in sodium chloride 0.9 % 100 mL IVPB  Status:  Discontinued        2 g 200 mL/hr over 30 Minutes Intravenous Every 8 hours 09/13/20 1437 09/14/20 0359   09/13/20 1130  vancomycin (VANCOREADY) IVPB 1750 mg/350 mL         1,750 mg 175 mL/hr over 120 Minutes Intravenous  Once 09/13/20 1107 09/13/20 1414   09/13/20 1045  ceFAZolin (ANCEF) IVPB 1 g/50 mL premix        1 g 100 mL/hr over 30 Minutes Intravenous  Once 09/13/20 1031 09/13/20 1307        Subjective: Seen and examined at bedside he was doing okay today and his blood pressure is doing much better.  Leg is less swollen but his scrotum is significantly swollen today.  Has been getting a lot of IV fluid hydration.  Denies any nausea or vomiting but thinks his pain is fairly controlled but thinks it could be a little bit better.  No other concerns or complaints at this time and will be going for a TEE on Monday.  Pulmonary critical care signed off the case and case was discussed with orthopedics who recommends out of bed.  Has a Foley catheter in place now but discontinued in the AM.  Objective: Vitals:   09/17/20 1200 09/17/20 1300 09/17/20 1400 09/17/20 1500  BP: (!) 146/96 (!) 129/93    Pulse: (!) 108 (!) 103 (!) 129 (!) 104  Resp: (!) 21 16 (!) 40 (!)  24  Temp: 97.9 F (36.6 C)     TempSrc: Oral     SpO2: 100% 99% 99% 99%  Weight:      Height:        Intake/Output Summary (Last 24 hours) at 09/17/2020 1605 Last data filed at 09/17/2020 1538 Gross per 24 hour  Intake 1667.43 ml  Output 858 ml  Net 809.43 ml   Filed Weights   09/13/20 0905 09/15/20 2111  Weight: 78.9 kg 85.1 kg   Examination: Physical Exam:  Constitutional: WN/WD overweight Caucasian male currently no acute distress appears calm and slightly uncomfortable Eyes: Lids and conjunctivae normal, sclerae anicteric  ENMT: External Ears, Nose appear normal. Grossly normal hearing.   Neck: Appears normal, supple, no cervical masses, normal ROM, no appreciable thyromegaly; no JVD Respiratory: Diminished to auscultation bilaterally with coarse breath sounds, no wheezing, rales, rhonchi or crackles. Normal respiratory effort and patient is not tachypenic. No accessory muscle use.   Not wearing supplemental oxygen via nasal cannula and has unlabored breathing Cardiovascular: RRR, no murmurs / rubs / gallops. S1 and S2 auscultated.  Has 1-2+ lower extremity left leg edema  Abdomen: Soft, non-tender, slightly distended.  Bowel sounds positive.  GU: Foley catheter is in place and he has some significant scrotal swelling Musculoskeletal: No clubbing / cyanosis of digits/nails.  Has drains in the left leg and it is swollen. Skin: No rashes, lesions, ulcers on limited skin evaluation. No induration; Warm and dry.  Neurologic: CN 2-12 grossly intact with no focal deficits. Romberg sign and cerebellar reflexes not assessed.  Psychiatric: Normal judgment and insight. Alert and oriented x 3. Normal mood and appropriate affect.   Data Reviewed: I have personally reviewed following labs and imaging studies  CBC: Recent Labs  Lab 09/13/20 1045 09/13/20 1821 09/14/20 0525 09/15/20 0450 09/15/20 1745 09/15/20 2052 09/16/20 0238 09/16/20 1419 09/17/20 0511  WBC 10.6*  --  8.3 12.8*  --  24.4* 31.6*  --  12.1*  NEUTROABS 8.4*  --   --  10.2*  --   --  26.2*  --  9.6*  HGB 6.2*   < > 7.6* 8.0* 8.8* 8.0* 6.4* 7.9* 7.5*  HCT 18.9*   < > 22.6* 24.4* 26.0* 24.0* 19.5* 23.3* 22.5*  MCV 106.2*  --  100.9* 103.4*  --  97.2 98.5  --  96.6  PLT 170  --  140* 139*  --  86* 84*  --  72*   < > = values in this interval not displayed.   Basic Metabolic Panel: Recent Labs  Lab 09/14/20 0525 09/15/20 0450 09/15/20 1745 09/15/20 2300 09/16/20 0238 09/17/20 0511  NA 131* 128* 130* 132* 133* 131*  K 3.9 3.8 4.5 4.5 4.7 4.3  CL 101 98  --  103 104 102  CO2 22 20*  --  18* 17* 22  GLUCOSE 139* 100*  --  105* 127* 127*  BUN 16 12  --  _0 CREATININE 0.44* 0.54*  --  0.67 0.58* 0.38*  CALCIUM 7.8* 7.8*  --  7.1* 7.0* 7.2*  MG  --  1.8  --  2.0 1.9 2.0  PHOS  --  3.2  --  4.0 4.4 1.6*   GFR: Estimated Creatinine Clearance: 131.8 mL/min (A) (by C-G formula based on SCr of 0.38  mg/dL (L)). Liver Function Tests: Recent Labs  Lab 09/13/20 1045 09/14/20 0525 09/15/20 0450 09/15/20 2300 09/17/20 0511  AST 40 30 42* 38 43*  ALT  42 32 _0 ALKPHOS 156* 130* 191* 123 165*  BILITOT 2.3* 2.0* 1.5* 3.2* 1.3*  PROT 6.7 6.0* 5.8* 4.6* 4.9*  ALBUMIN 2.2* 2.0* 1.9* 2.1* 2.0*   No results for input(s): LIPASE, AMYLASE in the last 168 hours. Recent Labs  Lab 09/13/20 1520  AMMONIA 45*   Coagulation Profile: Recent Labs  Lab 09/13/20 1045 09/14/20 0525 09/15/20 2300  INR 1.4* 1.5* 2.2*   Cardiac Enzymes: No results for input(s): CKTOTAL, CKMB, CKMBINDEX, TROPONINI in the last 168 hours. BNP (last 3 results) No results for input(s): PROBNP in the last 8760 hours. HbA1C: No results for input(s): HGBA1C in the last 72 hours. CBG: Recent Labs  Lab 09/15/20 2116 09/15/20 2152  GLUCAP 61* 86   Lipid Profile: No results for input(s): CHOL, HDL, LDLCALC, TRIG, CHOLHDL, LDLDIRECT in the last 72 hours. Thyroid Function Tests: No results for input(s): TSH, T4TOTAL, FREET4, T3FREE, THYROIDAB in the last 72 hours. Anemia Panel: No results for input(s): VITAMINB12, FOLATE, FERRITIN, TIBC, IRON, RETICCTPCT in the last 72 hours. Sepsis Labs: Recent Labs  Lab 09/13/20 1030 09/13/20 1340 09/14/20 0525 09/15/20 2052 09/15/20 2300  PROCALCITON  --   --  3.00  --  21.56  LATICACIDVEN 3.3* 2.5*  --  5.0* 5.3*    Recent Results (from the past 240 hour(s))  Culture, blood (routine x 2)     Status: Abnormal   Collection Time: 09/13/20 10:45 AM   Specimen: BLOOD  Result Value Ref Range Status   Specimen Description   Final    BLOOD RIGHT ANTECUBITAL Performed at [facility] [address], [place], [place] [address]    Special Requests   Final    BOTTLES DRAWN AEROBIC AND ANAEROBIC Blood Culture adequate volume Performed at [facility] [address] [place], [place] [address]    Culture  Setup Time   Final     IN BOTH AEROBIC AND ANAEROBIC BOTTLES GRAM POSITIVE COCCI CRITICAL RESULT CALLED TO, READ BACK BY AND VERIFIED WITHSeleta Rhymes [facility] 09/13/20 0347 JDW Performed at [facility], [place] [address], [place],  [address]    Culture STAPHYLOCOCCUS AUREUS (A)  Final   Report Status 09/16/2020 FINAL  Final   Organism ID, Bacteria STAPHYLOCOCCUS AUREUS  Final      Susceptibility   Staphylococcus aureus - MIC*    CIPROFLOXACIN <=0.5 SENSITIVE Sensitive     ERYTHROMYCIN <=0.25 SENSITIVE Sensitive     GENTAMICIN <=0.5 SENSITIVE Sensitive     OXACILLIN <=0.25 SENSITIVE Sensitive     TETRACYCLINE <=1 SENSITIVE Sensitive     VANCOMYCIN <=0.5 SENSITIVE Sensitive     TRIMETH/SULFA <=10 SENSITIVE Sensitive     CLINDAMYCIN <=0.25 SENSITIVE Sensitive     RIFAMPIN <=0.5 SENSITIVE Sensitive     Inducible Clindamycin NEGATIVE Sensitive     * STAPHYLOCOCCUS AUREUS  Blood Culture ID Panel (Reflexed)     Status: Abnormal   Collection Time: 09/13/20 10:45 AM  Result Value Ref Range Status   Enterococcus faecalis NOT DETECTED NOT DETECTED Final   Enterococcus Faecium NOT DETECTED NOT DETECTED Final   Listeria monocytogenes NOT DETECTED NOT DETECTED Final   Staphylococcus species DETECTED (A) NOT DETECTED Final    Comment: CRITICAL RESULT CALLED TO, READ BACK BY AND VERIFIED WITH: E JACKSON PHARMD 09/13/20 0347 JDW    Staphylococcus aureus (BCID) DETECTED (A) NOT DETECTED Final    Comment: CRITICAL RESULT CALLED TO, READ BACK BY AND VERIFIED WITH:  E JACKSON [facility] 09/13/20 0347 JDW    Staphylococcus epidermidis NOT DETECTED NOT DETECTED Final   Staphylococcus lugdunensis NOT DETECTED NOT DETECTED Final   Streptococcus species NOT DETECTED NOT DETECTED Final   Streptococcus agalactiae NOT DETECTED NOT DETECTED Final   Streptococcus pneumoniae NOT DETECTED NOT DETECTED Final   Streptococcus pyogenes NOT DETECTED NOT DETECTED Final   A.calcoaceticus-baumannii NOT DETECTED NOT DETECTED Final    Bacteroides fragilis NOT DETECTED NOT DETECTED Final   Enterobacterales NOT DETECTED NOT DETECTED Final   Enterobacter cloacae complex NOT DETECTED NOT DETECTED Final   Escherichia coli NOT DETECTED NOT DETECTED Final   Klebsiella aerogenes NOT DETECTED NOT DETECTED Final   Klebsiella oxytoca NOT DETECTED NOT DETECTED Final   Klebsiella pneumoniae NOT DETECTED NOT DETECTED Final   Proteus species NOT DETECTED NOT DETECTED Final   Salmonella species NOT DETECTED NOT DETECTED Final   Serratia marcescens NOT DETECTED NOT DETECTED Final   Haemophilus influenzae NOT DETECTED NOT DETECTED Final   Neisseria meningitidis NOT DETECTED NOT DETECTED Final   Pseudomonas aeruginosa NOT DETECTED NOT DETECTED Final   Stenotrophomonas maltophilia NOT DETECTED NOT DETECTED Final   Candida albicans NOT DETECTED NOT DETECTED Final   Candida auris NOT DETECTED NOT DETECTED Final   Candida glabrata NOT DETECTED NOT DETECTED Final   Candida krusei NOT DETECTED NOT DETECTED Final   Candida parapsilosis NOT DETECTED NOT DETECTED Final   Candida tropicalis NOT DETECTED NOT DETECTED Final   Cryptococcus neoformans/gattii NOT DETECTED NOT DETECTED Final   Meth resistant mecA/C and MREJ NOT DETECTED NOT DETECTED Final    Comment: Performed at [facility], [address], [place], [place] [address]  Culture, blood (routine x 2)     Status: Abnormal   Collection Time: 09/13/20 11:20 AM   Specimen: BLOOD  Result Value Ref Range Status   Specimen Description   Final    BLOOD RIGHT ANTECUBITAL Performed at [facility] [address] [place], [place] [address]    Special Requests   Final    BOTTLES DRAWN AEROBIC AND ANAEROBIC Blood Culture adequate volume Performed at [facility] [address] [place], [place] [address]    Culture  Setup Time   Final    IN BOTH AEROBIC AND ANAEROBIC BOTTLES GRAM POSITIVE COCCI CRITICAL VALUE NOTED.  VALUE IS CONSISTENT WITH  PREVIOUSLY REPORTED AND CALLED VALUE.    Culture (A)  Final    STAPHYLOCOCCUS AUREUS SUSCEPTIBILITIES PERFORMED ON PREVIOUS CULTURE WITHIN THE LAST 5 DAYS. Performed at [facility], [place] [address] [place], [place] [address]    Report Status 09/16/2020 FINAL  Final  Resp Panel by RT-PCR (Flu A&B, Covid) Nasopharyngeal Swab     Status: None   Collection Time: 09/13/20  2:01 PM   Specimen: Nasopharyngeal Swab; Nasopharyngeal(NP) swabs in vial transport medium  Result Value Ref Range Status   SARS Coronavirus 2 by RT PCR NEGATIVE NEGATIVE Final    Comment: (NOTE) SARS-CoV-2 target nucleic acids are NOT DETECTED.  The SARS-CoV-2 RNA is generally detectable in upper respiratory specimens during the acute phase of infection. The lowest concentration of SARS-CoV-2 viral copies this assay can detect is 138 copies/mL. A negative result does not preclude SARS-Cov-2 infection and should not be used as the sole basis for treatment or other patient management decisions. A negative result may occur with  improper specimen collection/handling, submission of specimen other than nasopharyngeal swab, presence of viral mutation(s) within the areas targeted  by this assay, and inadequate number of viral copies(<138 copies/mL). A negative result must be combined with clinical observations, patient history, and epidemiological information. The expected result is Negative.  Fact Sheet for Patients:  EntrepreneurPulse.com.au  Fact Sheet for Healthcare Providers:  IncredibleEmployment.be  This test is no t yet approved or cleared by the [place] FDA and  has been authorized for detection and/or diagnosis of SARS-CoV-2 by FDA under an Emergency Use Authorization (EUA). This EUA will remain  in effect (meaning this test can be used) for the duration of the COVID-19 declaration under Section 564(b)(1) of the Act, 21 U.S.C.section 360bbb-3(b)(1), unless the  authorization is terminated  or revoked sooner.       Influenza A by PCR NEGATIVE NEGATIVE Final   Influenza B by PCR NEGATIVE NEGATIVE Final    Comment: (NOTE) The Xpert Xpress SARS-CoV-2/FLU/RSV plus assay is intended as an aid in the diagnosis of influenza from Nasopharyngeal swab specimens and should not be used as a sole basis for treatment. Nasal washings and aspirates are unacceptable for Xpert Xpress SARS-CoV-2/FLU/RSV testing.  Fact Sheet for Patients: EntrepreneurPulse.com.au  Fact Sheet for Healthcare Providers: IncredibleEmployment.be  This test is not yet approved or cleared by the [place] FDA and has been authorized for detection and/or diagnosis of SARS-CoV-2 by FDA under an Emergency Use Authorization (EUA). This EUA will remain in effect (meaning this test can be used) for the duration of the COVID-19 declaration under Section 564(b)(1) of the Act, 21 U.S.C. section 360bbb-3(b)(1), unless the authorization is terminated or revoked.  Performed at [facility], [place] [address] [place], [place] [address]   Culture, blood (routine x 2)     Status: None (Preliminary result)   Collection Time: 09/14/20  9:23 AM   Specimen: BLOOD RIGHT HAND  Result Value Ref Range Status   Specimen Description   Final    BLOOD RIGHT HAND Performed at [facility] [address], [place], [place] [address]    Special Requests   Final    BOTTLES DRAWN AEROBIC AND ANAEROBIC Blood Culture adequate volume Performed at [facility] [address], [place], [place] [address]    Culture   Final    NO GROWTH 2 DAYS Performed at [facility] [address], [place], [place] [address]    Report Status PENDING  Incomplete  Culture, blood (routine x 2)     Status: None (Preliminary result)   Collection Time: 09/14/20  9:24 AM   Specimen: BLOOD LEFT HAND  Result Value Ref Range  Status   Specimen Description   Final    BLOOD LEFT HAND Performed at [facility] [address], [place], [place] [address]    Special Requests   Final    BOTTLES DRAWN AEROBIC ONLY Blood Culture adequate volume Performed at [facility] [address], [place], [place] [address]    Culture   Final    NO GROWTH 2 DAYS Performed at [facility] [address], [place], [place] [address]    Report Status PENDING  Incomplete  Aerobic/Anaerobic Culture (surgical/deep wound)     Status: None (Preliminary result)   Collection Time: 09/14/20 12:37 PM   Specimen: Abscess  Result Value Ref Range Status   Specimen Description   Final    ABSCESS  LEFT RP Performed at [facility] [address] [place],  [address]    Special Requests  Final    Normal Performed at [facility], [place] [address], [place], [place] [address]    Gram Stain   Final    MODERATE WBC PRESENT, PREDOMINANTLY PMN ABUNDANT GRAM POSITIVE COCCI ABUNDANT GRAM NEGATIVE RODS    Culture   Final    ABUNDANT STAPHYLOCOCCUS AUREUS HOLDING FOR POSSIBLE ANAEROBE Performed at [facility], [place] [address], [place], [place] [address]    Report Status PENDING  Incomplete   Organism ID, Bacteria STAPHYLOCOCCUS AUREUS  Final      Susceptibility   Staphylococcus aureus - MIC*    CIPROFLOXACIN <=0.5 SENSITIVE Sensitive     ERYTHROMYCIN <=0.25 SENSITIVE Sensitive     GENTAMICIN <=0.5 SENSITIVE Sensitive     OXACILLIN <=0.25 SENSITIVE Sensitive     TETRACYCLINE <=1 SENSITIVE Sensitive     VANCOMYCIN <=0.5 SENSITIVE Sensitive     TRIMETH/SULFA <=10 SENSITIVE Sensitive     CLINDAMYCIN <=0.25 SENSITIVE Sensitive     RIFAMPIN <=0.5 SENSITIVE Sensitive     Inducible Clindamycin NEGATIVE Sensitive     * ABUNDANT STAPHYLOCOCCUS AUREUS  MRSA PCR Screening     Status: None   Collection Time: 09/15/20  3:22 AM   Specimen: Nasal Mucosa;  Nasopharyngeal  Result Value Ref Range Status   MRSA by PCR NEGATIVE NEGATIVE Final    Comment:        The GeneXpert MRSA Assay (FDA approved for NASAL specimens only), is one component of a comprehensive MRSA colonization surveillance program. It is not intended to diagnose MRSA infection nor to guide or monitor treatment for MRSA infections. Performed at [facility], [name] [address] [place], [place] [address]      RN Pressure Injury Documentation:     Estimated body mass index is 26.92 kg/m as calculated from the following:   Height as of this encounter: _0  (1.778 m).   Weight as of this encounter: 85.1 kg.  Malnutrition Type:  Nutrition Problem: Increased nutrient needs Etiology: chronic illness  Malnutrition Characteristics:  Signs/Symptoms: estimated needs  Nutrition Interventions:  Interventions: Ensure Enlive (each supplement provides 350kcal and 20 grams of protein),MVI   Radiology Studies: DG Pelvis Portable  Result Date: 09/15/2020 CLINICAL DATA:  Status post total left hip arthroplasty. EXAM: PORTABLE PELVIS 1-2 VIEWS COMPARISON:  None. FINDINGS: A left hip replacement is seen without evidence of surrounding lucency. There is no evidence of pelvic fracture or diastasis. No pelvic bone lesions are seen. Radiopaque surgical drains are seen. A mild-to-moderate amount of soft tissue air is seen along the lateral aspect of the left hip. IMPRESSION: Status post left hip replacement. Electronically Signed   By: Virgina Norfolk M.D.   On: 09/15/2020 20:20   DG Chest Port 1 View  Result Date: 09/15/2020 CLINICAL DATA:  Shock. EXAM: PORTABLE CHEST 1 VIEW COMPARISON:  September 13, 2020 FINDINGS: Mildly decreased lung volumes are seen which is likely secondary to the degree of patient inspiration. Very mild atelectasis and/or early infiltrate is noted within the right lung base. There is no evidence of a pleural effusion or pneumothorax. The heart  size and mediastinal contours are within normal limits. The visualized skeletal structures are unremarkable. IMPRESSION: Very mild right basilar atelectasis and/or early infiltrate. Electronically Signed   By: Virgina Norfolk M.D.   On: 09/15/2020 20:20   DG Hip Port Unilat With Pelvis 1V Left  Result Date: 09/15/2020 CLINICAL DATA:  Status post left hip arthroplasty. EXAM: DG HIP (WITH OR WITHOUT PELVIS) 1V  PORT LEFT COMPARISON:  None. FINDINGS: A left hip replacement is seen without evidence of surrounding lucency. There is no evidence of acute hip fracture or dislocation. There is no evidence of arthropathy or other focal bone abnormality. Multiple radiopaque surgical drains are seen. Radiopaque skin staples are also noted. IMPRESSION: Left hip replacement without evidence of hardware complication or fracture. Electronically Signed   By: Virgina Norfolk M.D.   On: 09/15/2020 20:21   Scheduled Meds: . Chlorhexidine Gluconate Cloth  6 each Topical Daily  . docusate sodium  100 mg Oral BID  . folic acid  1 mg Oral Daily  . lactulose  10 g Oral BID  . mouth rinse  15 mL Mouth Rinse BID  . multivitamin with minerals  1 tablet Oral Daily  . pantoprazole  40 mg Oral BID  . sertraline  25 mg Oral Daily  . sodium chloride flush  5 mL Intracatheter Q8H  . thiamine  100 mg Oral Daily   Continuous Infusions: . sodium chloride    . sodium chloride    .  ceFAZolin (ANCEF) IV 2 g (09/17/20 1553)  . lactated ringers 75 mL/hr at 09/17/20 0816    LOS: 4 days   Kerney Elbe, DO [facility] PAGER is on AMION  If 7PM-7AM, please contact night-coverage [email or web address]

## 2020-09-17 NOTE — Evaluation (Signed)
Physical Therapy Evaluation Patient Details Name: [name] MRN: [id number] DOB: [birth year] Today's Date: 09/17/2020   History of Present Illness  [name] is a 35 y.o. male with medical history significant of AVN of hip ( originally was scheduled for a THA this coming March2022), HTN, GERD. Recent admission for COVID , acute renal failure and alcoholic hepatitis (08/22/20- 08/29/20). He was ok for a few days after discharge, but he began to feel very tired, weak, left thigh and leg pain and progressed over the last 2 weeks. He reports that the left thigh had become increasingly swollen and tender to touch. He has had a blister rupture on the inner thigh in the groin area. These symptoms increased through 2 days ago when he was no longer ago to walk without great pain. 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  Clinical Impression   Pt has great amounts of swelling and weeping on L Lateral hip area, incision, and drains, also with swelling and pain in groin ara and scrotal area with redness and blistering. Nurse aware and Janese Banks some of these areas. This is also painful for the painwith little movoment. He did perform with assistance some gentle B LE exetrimity exercises in the bed , educated he and his girlfriend how to do these throughout the day. Sat EOB with assistance for about 10 minutes to tolerance, then laid back down. Pt was needing bed change and dressing change as well.   Painful, but patient very motivated and attempting to work through the pain to get better. Will continue to follow while here in acute care. Pt wants to eventually DC home, but  that depends on progress and if he will need post acute rehab other than HHPT will be determined.     Follow Up Recommendations Home health PT (pt needing a lot of assistance but has good caregiver support. Hoping for progres to be able to DC home, if not may need post acute rehab somewhere.)    Equipment Recommendations       Recommendations for Other Services       Precautions / Restrictions Precautions Precautions: Fall;Posterior Hip Precaution Comments: he now has posterior hip precautions, no handout given today, but began educating girlfriend and patient. Restrictions Weight Bearing Restrictions: No Other Position/Activity Restrictions: WBAT, however also has 3 drains in place on L antrior thigh.      Mobility  Bed Mobility Overal bed mobility: Needs Assistance Bed Mobility: Supine to Sit     Supine to sit: Max assist;+2 for physical assistance     General bed mobility comments: used pad under patient for gentle pivot to sit EOB, due to pain in R hip and also in groind/scotal area. All very swollen in this area with blisters and pain. Nurse aware and ws with [place] during this session.    Transfers Overall transfer level:  (were not able to sit to stand or transfer today due to the pain and uncomfortable with scrotal area as well needing the LLE and RLE abduction to allow for room between legs.)                  Ambulation/Gait  Stairs            Wheelchair Mobility    Modified Rankin (Stroke Patients Only)       Balance Overall balance assessment: Needs assistance Sitting-balance support: Feet unsupported;Bilateral upper extremity supported (only 1 foot supported and could not lift his UE to balance due to relyingon them to lean back to decrease pressure at hip with sitting.) Sitting balance-Leahy Scale: Poor                                       Pertinent Vitals/Pain Pain Assessment: 0-10 Pain Score: 9  Pain  Location: L hip Pain Descriptors / Indicators: Sharp;Aching;Burning Pain Intervention(s): Monitored during session;Premedicated before session;Repositioned;Limited activity within patient's tolerance    Home Living Family/patient expects to be discharged to:: Private residence Living Arrangements: Spouse/significant other Available Help at Discharge: Family Type of Home: House Home Access: Stairs to enter   [employer] of Steps: 2 in front, 5 in back, but has a ramp in place in front now Home Layout: One level Home Equipment: Cane - single point;Crutches;Walker - 2 wheels      Prior Function Level of Independence: Independent with assistive device(s);Needs assistance   Gait / Transfers Assistance Needed: since this started backin Jan he has had more and more difficultyw ith ambulation trying to use RW, but pain with the infection became so great he was having great amount of trouble walking. Prior to that he was working and independent.     Comments: using cane, then progressed to crutches recently due to hip pain     Hand Dominance        Extremity/Trunk Assessment        Lower Extremity Assessment Lower Extremity Assessment: RLE deficits/detail;LLE deficits/detail RLE Deficits / Details: likes to rest LE in ER and flexed, however can straighten R LE for full range , some weakness and atrophy noted ( LLE greater than Right) , generally 4/5 LLE Deficits / Details: very limited with pain. did full ROM with ankle and gentle knee bends and full extension with assistance. did not test stregth or against gravity due to pain.       Communication   Communication: No difficulties  Cognition Arousal/Alertness: Awake/alert Behavior During Therapy: WFL for tasks assessed/performed Overall Cognitive Status: Within Functional Limits for tasks assessed                                        General Comments      Exercises Total Joint Exercises Ankle  Circles/Pumps: AROM;10 reps;Both;Supine Quad Sets: AROM;Both;10 reps;Supine Gluteal Sets: AROM;Both;5 reps Heel Slides: AAROM;Both;10 reps;Supine (very limited and gentle on the LLE)   Assessment/Plan    PT Assessment Patient needs continued PT services  PT Problem List Decreased strength;Decreased mobility;Decreased activity tolerance;Decreased balance;Pain;Decreased range of motion       PT Treatment Interventions DME instruction;Therapeutic activities;Gait training;Therapeutic exercise;Functional mobility training;Patient/family education    PT Goals (Current goals can be found in the Care Plan section)  Acute Rehab PT Goals Patient Stated Goal: to be able to walk again PT Goal Formulation: With patient/family Time For Goal Achievement: 09/30/20 Potential to Achieve Goals: Good    Frequency Min 6X/week   Barriers to discharge        Co-evaluation  AM-PAC PT "6 Clicks" Mobility  Outcome Measure Help needed turning from your back to your side while in a flat bed without using bedrails?: A Lot Help needed moving from lying on your back to sitting on the side of a flat bed without using bedrails?: A Lot Help needed moving to and from a bed to a chair (including a wheelchair)?: A Lot Help needed standing up from a chair using your arms (e.g., wheelchair or bedside chair)?: A Lot Help needed to walk in hospital room?: A Lot Help needed climbing 3-5 steps with a railing? : A Lot 6 Click Score: 12    End of Session   Activity Tolerance: Patient limited by pain Patient left: in bed;with call bell/phone within reach;with family/visitor present;with nursing/sitter in room Nurse Communication: Mobility status;Precautions (posterior hip precautions) PT Visit Diagnosis: Other abnormalities of gait and mobility (R26.89);Muscle weakness (generalized) (M62.81);Pain Pain - Right/Left: Left Pain - part of body: Hip    Time: 7106-2694 PT Time Calculation (min)  (ACUTE ONLY): 48 min   Charges:   PT Evaluation $PT Eval Moderate Complexity: 1 Mod PT Treatments $Therapeutic Activity: 23-37 mins        Dortha Neighbors, PT, MPT Acute Rehabilitation Services Office: [phone] Pager: [phone] 09/17/2020   Marella Bile 09/17/2020, 5:23 PM

## 2020-09-17 NOTE — Progress Notes (Addendum)
Pt's dressings on the left hip was saturated. Dressing was changed & incision site was cleaned/unremarkable. Pt's 1st drain dressing was saturated as well. Dressing was changed. Drains have increased in output. Drain #1- 0 cc, Drain #2 45 cc, & Drain #3- 80 cc. Both drain #2 & #3 output were dark red/brown in color. Left leg up to the hip is still swollen including scrotum. Inner left thigh near scrotum is red & tender. Both are elevated. This nurse will continue to monitor.

## 2020-09-18 ENCOUNTER — Inpatient Hospital Stay (HOSPITAL_COMMUNITY): Payer: 59

## 2020-09-18 ENCOUNTER — Encounter (HOSPITAL_COMMUNITY): Payer: Self-pay | Admitting: Orthopedic Surgery

## 2020-09-18 DIAGNOSIS — K7010 Alcoholic hepatitis without ascites: Secondary | ICD-10-CM | POA: Diagnosis not present

## 2020-09-18 DIAGNOSIS — K6812 Psoas muscle abscess: Secondary | ICD-10-CM | POA: Diagnosis not present

## 2020-09-18 DIAGNOSIS — K7030 Alcoholic cirrhosis of liver without ascites: Secondary | ICD-10-CM | POA: Diagnosis not present

## 2020-09-18 DIAGNOSIS — E872 Acidosis: Secondary | ICD-10-CM

## 2020-09-18 DIAGNOSIS — L02416 Cutaneous abscess of left lower limb: Secondary | ICD-10-CM | POA: Diagnosis not present

## 2020-09-18 LAB — COMPREHENSIVE METABOLIC PANEL
ALT: 23 U/L (ref 0–44)
AST: 61 U/L — ABNORMAL HIGH (ref 15–41)
Albumin: 2.3 g/dL — ABNORMAL LOW (ref 3.5–5.0)
Alkaline Phosphatase: 270 U/L — ABNORMAL HIGH (ref 38–126)
Anion gap: 8 (ref 5–15)
BUN: 10 mg/dL (ref 6–20)
CO2: 23 mmol/L (ref 22–32)
Calcium: 7.5 mg/dL — ABNORMAL LOW (ref 8.9–10.3)
Chloride: 98 mmol/L (ref 98–111)
Creatinine, Ser: 0.44 mg/dL — ABNORMAL LOW (ref 0.61–1.24)
GFR, Estimated: >60 mL/min (ref >60)
Glucose, Bld: 91 mg/dL (ref 70–99)
Potassium: 4.4 mmol/L (ref 3.5–5.1)
Sodium: 129 mmol/L — ABNORMAL LOW (ref 135–145)
Total Bilirubin: 1.7 mg/dL — ABNORMAL HIGH (ref 0.3–1.2)
Total Protein: 5.7 g/dL — ABNORMAL LOW (ref 6.5–8.1)

## 2020-09-18 LAB — CBC WITH DIFFERENTIAL/PLATELET
Abs Immature Granulocytes: 1.05 10*3/uL — ABNORMAL HIGH (ref 0.00–0.07)
Basophils Absolute: 0.1 10*3/uL (ref 0.0–0.1)
Basophils Relative: 1 %
Eosinophils Absolute: 0.1 10*3/uL (ref 0.0–0.5)
Eosinophils Relative: 1 %
HCT: 30.2 % — ABNORMAL LOW (ref 39.0–52.0)
Hemoglobin: 9.8 g/dL — ABNORMAL LOW (ref 13.0–17.0)
Immature Granulocytes: 7 %
Lymphocytes Relative: 9 %
Lymphs Abs: 1.4 10*3/uL (ref 0.7–4.0)
MCH: 31.7 pg (ref 26.0–34.0)
MCHC: 32.5 g/dL (ref 30.0–36.0)
MCV: 97.7 fL (ref 80.0–100.0)
Monocytes Absolute: 0.3 10*3/uL (ref 0.1–1.0)
Monocytes Relative: 2 %
Neutro Abs: 12.2 10*3/uL — ABNORMAL HIGH (ref 1.7–7.7)
Neutrophils Relative %: 80 %
Platelets: 86 10*3/uL — ABNORMAL LOW (ref 150–400)
RBC: 3.09 MIL/uL — ABNORMAL LOW (ref 4.22–5.81)
RDW: 17.2 % — ABNORMAL HIGH (ref 11.5–15.5)
WBC: 15.1 10*3/uL — ABNORMAL HIGH (ref 4.0–10.5)
nRBC: 1.3 % — ABNORMAL HIGH (ref 0.0–0.2)

## 2020-09-18 LAB — PROCALCITONIN: Procalcitonin: 7.78 ng/mL

## 2020-09-18 LAB — PHOSPHORUS: Phosphorus: 1.6 mg/dL — ABNORMAL LOW (ref 2.5–4.6)

## 2020-09-18 LAB — LACTIC ACID, PLASMA
Lactic Acid, Venous: 2.8 mmol/L (ref 0.5–1.9)
Lactic Acid, Venous: 3.1 mmol/L (ref 0.5–1.9)

## 2020-09-18 LAB — MAGNESIUM: Magnesium: 1.9 mg/dL (ref 1.7–2.4)

## 2020-09-18 MED ORDER — SODIUM PHOSPHATES 45 MMOLE/15ML IV SOLN
30.0000 mmol | Freq: Once | INTRAVENOUS | Status: AC
  Administered 2020-09-18: 30 mmol via INTRAVENOUS
  Filled 2020-09-18: qty 10

## 2020-09-18 MED ORDER — METRONIDAZOLE 500 MG PO TABS
500.0000 mg | ORAL_TABLET | Freq: Three times a day (TID) | ORAL | Status: DC
  Administered 2020-09-18 – 2020-10-10 (×65): 500 mg via ORAL
  Filled 2020-09-18 (×65): qty 1

## 2020-09-18 MED ORDER — SODIUM CHLORIDE 0.9 % IV BOLUS
250.0000 mL | Freq: Once | INTRAVENOUS | Status: AC
  Administered 2020-09-18: 250 mL via INTRAVENOUS

## 2020-09-18 MED ORDER — OXYCODONE HCL 5 MG PO TABS
5.0000 mg | ORAL_TABLET | ORAL | Status: DC | PRN
  Administered 2020-09-18 (×3): 10 mg via ORAL
  Administered 2020-09-19 (×2): 5 mg via ORAL
  Administered 2020-09-19 (×2): 10 mg via ORAL
  Administered 2020-09-20 (×2): 5 mg via ORAL
  Administered 2020-09-21: 10 mg via ORAL
  Administered 2020-09-21: 21:00:00 5 mg via ORAL
  Administered 2020-09-21 – 2020-09-23 (×4): 10 mg via ORAL
  Filled 2020-09-18: qty 1
  Filled 2020-09-18: qty 2
  Filled 2020-09-18: qty 1
  Filled 2020-09-18 (×4): qty 2
  Filled 2020-09-18: qty 1
  Filled 2020-09-18 (×2): qty 2
  Filled 2020-09-18: qty 1
  Filled 2020-09-18 (×2): qty 2
  Filled 2020-09-18: qty 1
  Filled 2020-09-18 (×4): qty 2

## 2020-09-18 MED ORDER — CARVEDILOL 3.125 MG PO TABS
3.1250 mg | ORAL_TABLET | Freq: Two times a day (BID) | ORAL | Status: DC
  Administered 2020-09-18 – 2020-09-19 (×4): 3.125 mg via ORAL
  Filled 2020-09-18 (×4): qty 1

## 2020-09-18 MED ORDER — IOHEXOL 300 MG/ML  SOLN
100.0000 mL | Freq: Once | INTRAMUSCULAR | Status: AC | PRN
  Administered 2020-09-18: 100 mL via INTRAVENOUS

## 2020-09-18 MED ORDER — FUROSEMIDE 10 MG/ML IJ SOLN
40.0000 mg | Freq: Once | INTRAMUSCULAR | Status: AC
  Administered 2020-09-18: 40 mg via INTRAVENOUS
  Filled 2020-09-18: qty 4

## 2020-09-18 MED ORDER — MAGNESIUM SULFATE 2 GM/50ML IV SOLN
2.0000 g | Freq: Once | INTRAVENOUS | Status: AC
  Administered 2020-09-18: 2 g via INTRAVENOUS
  Filled 2020-09-18: qty 50

## 2020-09-18 NOTE — Progress Notes (Signed)
Brief ID progress note:  Patient chart reviewed.  Remains afebrile, T-max 98.8.  Tachycardia overnight noted with heart rates in the 120s to 130s.  Slight increase in leukocytosis from yesterday but overall trend down.  Scrotal ultrasound from yesterday completed in the setting of swelling with no evidence of testicular mass or torsion.  He did have asymmetric left scrotal wall thickening and fluid with increased blood flow likely due to soft tissue infection related to his more widespread process in the left pelvis and thigh.  Will need to continue monitoring to ensure no abscess formation.  Overall drain output with 215 cc in the last 24 h, however, drain #1 with 0 cc over the last 2 days.  Cultures reviewed.  Blood cultures 2/23 remain no growth.  Abscess fluid cultures with abundant MSSA and holding for possible anaerobe.  We'll continue cefazolin to treat his widespread MSSA infection and add Flagyl today for anaerobic coverage given his abscess culture.  Dr. Luciana Axe will assume care tomorrow.   Vedia Coffer for Infectious Disease [facility] 09/18/2020, 9:25 AM

## 2020-09-18 NOTE — Progress Notes (Signed)
Inpatient Rehab Admissions Coordinator:  Consult received. Attempted to contact pt via phone. No answer.  Will continue to follow.    Wolfgang Phoenix, MS, CCC-SLP Admissions Coordinator [phone]

## 2020-09-18 NOTE — H&P (View-Only) (Signed)
PROGRESS NOTE    [name]  LKG:[id number] DOB: 1986-02-01 DOA: 09/13/2020 PCP: [name], PA   Brief Narrative:  HPI per Dr. Cherylann Ratel on 09/13/20 [name] is a 35 y.o. male with medical history significant of AVN of hip, HTN, GERD. Presenting with fatigue and left leg pain. Recent admission for COVID (08/22/20). Successfully completed therapy and was discharged on 08/29/20. At that time he was discharged to treatment for alcoholic hepatitis as well. He was ok for a few days after discharge, but he began to feel very tired and weak. He did not try any medicines to help. He tried rest. This progressed over these last 2 weeks. Also during this time, he began having left thigh and leg pain. He reports that the left thigh had become increasingly swollen and tender to touch. He has had a blister rupture on the inner thigh in the groin area. These symptoms increased through 2 days ago when he was no longer ago to walk without great pain. He saw his PCP yesterday who drew labs. He was called this morning by that team and advised to go to the ED immediately. He denies any other aggravating or alleviating factors.   ED Course: He was found to have a Hgb of 6.2. pRBCs were ordered. He was FOBT negative. CT imaging showed left iliopsoas and left gluteal abscesses w/ diffuse swelling and left hip effusion. CCS was consulted. Patient was started on cefazolin and vanc. TRH was called for admission.   **Interim History  Patient now has an MSSA bacteremia so ID was consulted and blood cultures repeated an echocardiogram was obtained.  Because of his abscesses and his iliopsoas muscle and multiple normal abscesses in the legs he underwent 3 drain placement by IR.  Orthopedic surgery is evaluating and planning for surgical intervention 09/15/20.  Blood count is improved after 2 units of PRBCs and he will be getting more units today for his Left Hip Interposition Arthoplasty today.  Blood cultures  are no growth to date so far and he continues to be on IV cefazolin however infectious diseases recommending a TEE so we will contact cardiology to set this up will be done per their scheduling  After his left hip total plasty with antibiotic spacer placement and debridement he had septic shock and decompensated and had hypotension despite 2 unit PRBCs, 2.2 L of crystalloid and 500 mL of albumin.  He was admitted to the ICU and started on peripheral pressors and critical care assumed care for 09/16/2020 and now is back on [facility] service.  He appears volume overloaded from all the fluid that he is gotten and has hepatic cirrhosis so likely has some anasarca so we will give him a dose of IV Lasix today.    He continues to be volume overloaded so was given another dose of IV Lasix however his lactic acid level started trending slightly upwards and went from 2.83.1 so was given a 250 mL bolus.  PT OT recommending CIR.  Scrotal ultrasound was done given his scrotal swelling and did not show any evidence of any testicular mass or torsion but did show asymmetric left scrotal wall thickening with fluid and increased blood so due to soft tissue infection related to his more likely widespread process in the left pelvis and thigh.  We will need to continue to monitor this for abscess formation but continue antibiotics with IV cefazolin and ID adding Flagyl for Anaerobic Coverage .  His beta-blocker was resumed  today as well given that he was tachycardic and likely had a rebound tachycardia.  Assessment & Plan:   Principal Problem:   MSSA bacteremia Active Problems:   Alcohol dependence ([place])   Alcoholic hepatitis   Sepsis ([place])   Septic arthritis ([place])   Iliopsoas abscess on left ([place])  Iliopsoas and left gluteal abscesses associated with MSSA Bacteremia  Septic shock secondary to above and left hip septic arthritis Left hip effusion and septic arthritis in the setting of avascular necrosis -Admitted to  Inpatient Telemetry  -Initially started on IV vanc, cefepime; pharmacy to dose but changed to IV Cefazolin by ID -Repeat Blood Cx x2 and obtain ECHO; See below -Initial Blood Cx 09/13/20 showed Staph Aureus in Both sets of Cx's -Fluids had initially stopped but then he received 2.2 crystalloid boluses as well as 2 units of PRBCs postoperatively -Sepsis Criteria met with: tachycardia, tachypnea, lactic acid 3.3, and Leukocytosis was 10.6 source: abscesses as above as well as bacteremia and went into septic shock during this admission -PCT was 3.00 and trended up to 21.56 -> 7.78 -WBC has gone from 10.6 -> 8.3 -> 12.8 -> 31.6 -> 12.1 -> 15.1 -Lactic trended up to 5.3 and then trended down to 2.8 and is 3.1  -General surgery recs IR involvement for possible abscess drainage and for ortho to review (CCS consulted IR, EDP has consulted ortho); appreciate assistance, will await further recs -Surgery had planned a total hip replacement early March and plan is to currently drain iliopsoas abscess today and regular pulse the patient for a left hip interposition arthroplasty tomorrow with Dr. Mardelle Matte and will continue antibiotics and they will make the patient n.p.o. at midnight today -Interventional radiology 3 drains in place today via CT-guided placement with a 14 French drainage catheter into the left retroperitoneal abscess into 12 French drains in the medial and lateral abscesses with the anterior aspect of left thigh and there is a total of 1.3 L of purulent fluid that was aspirated following drain placement sent for evaluation -Interventional Radiology evaluated the drains and they are recommending continue drain management and continue every shift flushes and monitoring of output; he is going to go to the OR today and if the drains are remain and the following plan for this is to repeat a CT scan when his drain output is less than 10 cc/day to assess for possible removal -Drain Gram Stain showing Moderate  WBC, Abundant Gram Positive Cocci, and Abundant Gram Negative Rods with Cultures showing abundant Staph Aureus which is pan sensitive -Infectious diseases consulted and recommending changing antibiotic to cefazolin and following repeat echocardiogram; ECHO as below did not mention any vegetation; infectious diseases is now recommending the addition of Flagyl -Repeat blood cultures have been done and show NGTD at 4 Days ours and if TTE is negative they can recommend a transesophageal echocardiogram then but since his TTE was a difficult Study, ID Dr. Juleen China recommending TEE given Disseminated MSSA infection so will discuss with Cardiology to do an this will be done per cardiology scheduling and likely this will be done Monday, 09/19/2020. -Continue PT OT evaluation and continue dressing changes per orthopedic surgery and VT prophylaxis further recommendations but currently this is held given his anemia and thrombocytopenia -PT/OT recommending CIR   Symptomatic Anemia Macrocytic Anemia -Presented with Hgb/Hct of 6.2/18.9 -Patient is s/p 2 units pRBCs and improved but once he went to the OR postoperatively he dropped to 6.4/90.5 and then was given 2 units of PRBCs  and trended up to 7.9/20.3 and now is 7.5/22.5 yesterday and is 9.8/30.2 -FOBT negative -Anemia panel done and showed an iron level of 44, U IBC 131, TIBC 175, saturation ratios of 25% -check LDH and was 308, haptoglobin pending, iron studies showed an iron level of 44, U IBC 131, TIBC 175, saturation ratios of 25% -Continue to monitor for signs and symptoms of bleeding; currently no overt bleeding noted next- -repeat CBC in a.m.  Hx of Alcoholic Hepatitis Hepatic Cirrhosis Hyperbilirubinemia, was improving and now worsening  Mildly abnormal AST -Was continiung Prednisolone (he is on 34m through 09/26/20 and then tapers) but given his disseminated MSSA bacteremia will discontinue taper given LFTs being improved and because T Bili Trending  down and is gone from 3.2 -> 1.3 -> 1.7. I spoke with Dr. KTherisa Doyneof Gastroenterology who feels the patient does not need taper now that this has been discontinued -Check PT-INR in the AM and CMP again in the AM  -Patient and family report he has been alcohol free for 1 month -Continue lactulose 10 g p.o. twice daily, thiamine, magnesium -Patient's ammonia level was 45 yesterday not repeated today but will repeat again in the morning as well as continuing to monitor his PT and INR but this was not done today -Patient was given IV fluid hydration with LR and will stop given his hepatic cirrhosis and poor albumin status; Given 500 mL Bolus as his Lactic Acid Level was elevated -AST is slightly elevated at 43 and slightly worsened to 61 -Continue monitor hepatic function daily and if worsening will call GI for further assistance but at this time will discontinue the prednisone taper   Hx of Tachycardia; Now in Sinus Tachycardia  HTN  -Baseline heart rate is anywhere between 100-110 per family -Continue Carvedilol 3.125 mg p.o. twice daily (held due to hypotension but now resumed  GAD -Continue sertraline 25 mg p.o. nightly  GERD -C/w Pantoprazole 40 mg p.o. twice daily  Hyponatremia -Was initiated on IV fluids with normal saline but this is now stopped  -Sodium is now 129 -Will give a dose of Sodium Phos IV -We will give another dose of IV Lasix (Given yesterday);  -Continue to monitor and trend and repeat CMP in a.m.  Metabolic Acidosis -Mild and is now improved -Patient CO2 is now 23, anion gap is 8, chloride level is 98 -IV fluid hydration was resumed by the ICU team given his septic shock but will now discontinue given his volume overload and anasarca -Continue to monitor and trend and repeat CMP in a.m.  Hypophosphatemia -Patient's phosphorus level is 1.6 today -Replete with IV Sodium Phos 20 mmol  -Continue to monitor and replete as necessary -Repeat CBC in a.m.  Scrotal  Edema and Anasarca -Patient is positive +4.343 L since admission -In the setting of his hepatic cirrhosis and IV fluid resuscitation- -Stopped IV fluids and recommend scrotal elevation and sling and obtain a scrotal ultrasound and Doppler to rule out any pathology -Scrotal U/S done and showed "No evidence of testicular mass or torsion. Marked asymmetric left scrotal wall thickening and fluid, with increased blood flow. This is likely due to soft tissue infection, and part of the much more widespread process in the left pelvis and thigh demonstrated on prior CT." -Continue Foley for today and will give another dose of IV Lasix 40 mg x 1 again today -Foley to be removed as patient is getting up with Therapy  -Trial of void early in the morning and  discontinue Foley at that time  Thrombocytopenia  -Patient's platelet count had dropped from 170,000 and could be in the setting of his cirrhosis and surgical issues and he is to count is now 72,000 yesterday and today is 86,000 -Continue to monitor for signs and symptoms of bleeding; currently no overt bleeding noted -Repeat CBC in a.m.  DVT prophylaxis: SCDs Code Status: FULL CODE  Family Communication: Discussed with family at bedside Disposition Plan: Pending further clinical improvement and evaluation and clearance by specialist  Status is: Inpatient  Remains inpatient appropriate because:Unsafe d/c plan, IV treatments appropriate due to intensity of illness or inability to take PO and Inpatient level of care appropriate due to severity of illness   Dispo: The patient is from: Home              Anticipated d/c is to: TBD; PT OT to evaluate and treat              Anticipated d/c date is: 3 days              Patient currently is not medically stable to d/c.   Difficult to place patient No   Consultants:   General Surgery  Orthopedic Surgery  Infectious Diseases  Interventional Radiology  Case was discussed with Gastroenterology     Procedures:  ECHOCARDIOGRAM IMPRESSIONS    1. Left ventricular ejection fraction, by estimation, is 60 to 65%. The  left ventricle has normal function. The left ventricle has no regional  wall motion abnormalities. There is moderate concentric left ventricular  hypertrophy. Left ventricular  diastolic function could not be evaluated. Elevated left ventricular  end-diastolic pressure.  2. Right ventricular systolic function is normal. The right ventricular  size is normal. Tricuspid regurgitation signal is inadequate for assessing  PA pressure.  3. The mitral valve is normal in structure. No evidence of mitral valve  regurgitation. No evidence of mitral stenosis.  4. The aortic valve is normal in structure. Aortic valve regurgitation is  not visualized. No aortic stenosis is present.  5. The inferior vena cava is normal in size with greater than 50%  respiratory variability, suggesting right atrial pressure of 3 mmHg.  6. Trivial pericardial effusion is present. The pericardial effusion is  circumferential.   FINDINGS  Left Ventricle: Left ventricular ejection fraction, by estimation, is 60  to 65%. The left ventricle has normal function. The left ventricle has no  regional wall motion abnormalities. The left ventricular internal cavity  size was normal in size. There is  moderate concentric left ventricular hypertrophy. Left ventricular  diastolic function could not be evaluated. Elevated left ventricular  end-diastolic pressure.   Right Ventricle: The right ventricular size is normal. No increase in  right ventricular wall thickness. Right ventricular systolic function is  normal. Tricuspid regurgitation signal is inadequate for assessing PA  pressure.   Left Atrium: Left atrial size was normal in size.   Right Atrium: Right atrial size was normal in size.   Pericardium: Trivial pericardial effusion is present. The pericardial  effusion is circumferential.    Mitral Valve: The mitral valve is normal in structure. No evidence of  mitral valve regurgitation. No evidence of mitral valve stenosis.   Tricuspid Valve: The tricuspid valve is normal in structure. Tricuspid  valve regurgitation is mild . No evidence of tricuspid stenosis.   Aortic Valve: The aortic valve is normal in structure. Aortic valve  regurgitation is not visualized. No aortic stenosis is present.   Pulmonic  Valve: The pulmonic valve was normal in structure. Pulmonic valve  regurgitation is not visualized. No evidence of pulmonic stenosis.   Aorta: The aortic root is normal in size and structure.   Venous: The inferior vena cava is normal in size with greater than 50%  respiratory variability, suggesting right atrial pressure of 3 mmHg.   IAS/Shunts: No atrial level shunt detected by color flow Doppler.     LEFT VENTRICLE  PLAX 2D  LVIDd:     4.70 cm   Diastology  LVIDs:     2.80 cm   LV e' medial:  3.81 cm/s  LV PW:     1.50 cm   LV E/e' medial: 31.0  LV IVS:    1.40 cm   LV e' lateral:  6.64 cm/s  LVOT diam:   2.30 cm   LV E/e' lateral: 17.8  LV SV:     78  LV SV Index:  39  LVOT Area:   4.15 cm    LV Volumes (MOD)  LV vol d, MOD A2C: 69.7 ml  LV vol d, MOD A4C: 115.0 ml  LV vol s, MOD A2C: 25.9 ml  LV vol s, MOD A4C: 47.1 ml  LV SV MOD A2C:   43.8 ml  LV SV MOD A4C:   115.0 ml  LV SV MOD BP:   51.8 ml   IVC  IVC diam: 1.50 cm   LEFT ATRIUM      Index    RIGHT ATRIUM      Index  LA diam:   3.80 cm 1.93 cm/m RA Area:   12.30 cm  LA Vol (A2C): 39.5 ml 20.08 ml/m RA Volume:  23.60 ml 12.00 ml/m  LA Vol (A4C): 32.6 ml 16.57 ml/m  AORTIC VALVE  LVOT Vmax:  130.00 cm/s  LVOT Vmean: 86.000 cm/s  LVOT VTI:  0.187 m    AORTA  Ao Root diam: 3.30 cm  Ao Asc diam: 3.20 cm   MITRAL VALVE  MV Area (PHT): 5.84 cm   SHUNTS  MV Decel Time: 130 msec   Systemic VTI: 0.19  m  MV E velocity: 118.00 cm/s Systemic Diam: 2.30 cm   DRAIN PLACEMENT  Septic effusion of the left hip and associated RP and thigh abscesses Post procedural Dx: Same  Technically successful [place] and CT guided placement of a 14 Fr drainage catheter into left retroperitoneal abscess and two 12 Fr drains into medial and lateral abscesses within the anterior aspect of the left thigh.  A total of 1.3 L of purulent fluid was aspirated following drain placement. A representative aspirated sample was capped and sent to the laboratory for analysis.    Antimicrobials:  Anti-infectives (From admission, onward)   Start     Dose/Rate Route Frequency Ordered Stop   09/18/20 1400  metroNIDAZOLE (FLAGYL) tablet 500 mg        500 mg Oral Every 8 hours 09/18/20 0927     09/15/20 1715  tobramycin (NEBCIN) powder  Status:  Discontinued          As needed 09/15/20 1715 09/15/20 2043   09/15/20 1715  vancomycin (VANCOCIN) powder  Status:  Discontinued          As needed 09/15/20 1716 09/15/20 2043   09/14/20 0600  ceFAZolin (ANCEF) IVPB 2g/100 mL premix        2 g 200 mL/hr over 30 Minutes Intravenous Every 8 hours 09/14/20 0359     09/13/20 2200  vancomycin (VANCOREADY)  IVPB 1500 mg/300 mL  Status:  Discontinued        1,500 mg 150 mL/hr over 120 Minutes Intravenous Every 12 hours 09/13/20 1636 09/14/20 0359   09/13/20 1500  ceFEPIme (MAXIPIME) 2 g in sodium chloride 0.9 % 100 mL IVPB  Status:  Discontinued        2 g 200 mL/hr over 30 Minutes Intravenous Every 8 hours 09/13/20 1437 09/14/20 0359   09/13/20 1130  vancomycin (VANCOREADY) IVPB 1750 mg/350 mL        1,750 mg 175 mL/hr over 120 Minutes Intravenous  Once 09/13/20 1107 09/13/20 1414   09/13/20 1045  ceFAZolin (ANCEF) IVPB 1 g/50 mL premix        1 g 100 mL/hr over 30 Minutes Intravenous  Once 09/13/20 1031 09/13/20 1307        Subjective: Seen and examined at bedside he he states that he was feeling well today.  States his left leg  and hip was hurting.  Continues to have significant left leg swelling as well as some right leg swelling now.  Scrotum is extremely swollen and I discussed with him that there is no evidence of any torsion or testicular mass noted and likely from the infection.  He was recommended to have elevation of his extremities.  PT OT recommending CIR so they will be consulted.  He remains volume overloaded so given a dose of IV Lasix but then also had a mild increase in lactic acidosis so was given a 250 mL bolus.  We will need to continue monitor his labs carefully.  No other concerns or complaints at this time.  Objective: Vitals:   09/18/20 0900 09/18/20 1000 09/18/20 1100 09/18/20 1200  BP: 136/68 126/89 121/71 125/62  Pulse: (!) 131 (!) 126 (!) 121 (!) 116  Resp: (!) _0 Temp:    98.6 F (37 C)  TempSrc:    Axillary  SpO2: 98% (!) 88% 99% 96%  Weight:      Height:        Intake/Output Summary (Last 24 hours) at 09/18/2020 1616 Last data filed at 09/18/2020 1323 Gross per 24 hour  Intake 866.33 ml  Output 2040 ml  Net -1173.67 ml   Filed Weights   09/13/20 0905 09/15/20 2111  Weight: 78.9 kg 85.1 kg   Examination: Physical Exam:  Constitutional: WN/WD overweight Caucasian male currently no acute distress appears a little uncomfortable and in pain today. Eyes: Lids and conjunctivae normal, sclerae anicteric  ENMT: External Ears, Nose appear normal. Grossly normal hearing. Neck: Appears normal, supple, no cervical masses, normal ROM, no appreciable thyromegaly neck: No appreciable JVD Respiratory: Diminished to auscultation bilaterally with coarse breath sounds, no wheezing, rales, rhonchi or crackles. Normal respiratory effort and patient is not tachypenic. No accessory muscle use.  Not wearing any supplemental oxygen via nasal cannula Cardiovascular: Tachycardic rate but regular rhythm with heart rates in the 120s, no murmurs / rubs / gallops. S1 and S2 auscultated.  Has 1-2+  lower extremity pitting edema worse on left compared to the right Abdomen: Soft, non-tender, distended secondary to body habitus.  Bowel sounds positive.  GU: Scrotum is extremely swollen Foley catheter has been removed now. Musculoskeletal: No clubbing / cyanosis of digits/nails.  Has 3 drains in the left leg with some extreme swelling.   Skin: No rashes, lesions, ulcers on limited skin evaluation. No induration; Warm and dry.  Neurologic: CN 2-12 grossly intact with no focal deficits. Romberg sign  and cerebellar reflexes not assessed.  Psychiatric: Normal judgment and insight. Alert and oriented x 3. Normal mood and appropriate affect.   Data Reviewed: I have personally reviewed following labs and imaging studies  CBC: Recent Labs  Lab 09/13/20 1045 09/13/20 1821 09/15/20 0450 09/15/20 1745 09/15/20 2052 09/16/20 0238 09/16/20 1419 09/17/20 0511 09/18/20 0303  WBC 10.6*   < > 12.8*  --  24.4* 31.6*  --  12.1* 15.1*  NEUTROABS 8.4*  --  10.2*  --   --  26.2*  --  9.6* 12.2*  HGB 6.2*   < > 8.0*   < > 8.0* 6.4* 7.9* 7.5* 9.8*  HCT 18.9*   < > 24.4*   < > 24.0* 19.5* 23.3* 22.5* 30.2*  MCV 106.2*   < > 103.4*  --  97.2 98.5  --  96.6 97.7  PLT 170   < > 139*  --  86* 84*  --  72* 86*   < > = values in this interval not displayed.   Basic Metabolic Panel: Recent Labs  Lab 09/15/20 0450 09/15/20 1745 09/15/20 2300 09/16/20 0238 09/17/20 0511 09/18/20 0303  NA 128* 130* 132* 133* 131* 129*  K 3.8 4.5 4.5 4.7 4.3 4.4  CL 98  --  103 104 102 98  CO2 20*  --  18* 17* 22 23  GLUCOSE 100*  --  105* 127* 127* 91  BUN 12  --  _0 CREATININE 0.54*  --  0.67 0.58* 0.38* 0.44*  CALCIUM 7.8*  --  7.1* 7.0* 7.2* 7.5*  MG 1.8  --  2.0 1.9 2.0 1.9  PHOS 3.2  --  4.0 4.4 1.6* 1.6*   GFR: Estimated Creatinine Clearance: 131.8 mL/min (A) (by C-G formula based on SCr of 0.44 mg/dL (L)). Liver Function Tests: Recent Labs  Lab 09/14/20 0525 09/15/20 0450 09/15/20 2300  09/17/20 0511 09/18/20 0303  AST 30 42* 38 43* 61*  ALT 32 _1 ALKPHOS 130* 191* 123 165* 270*  BILITOT 2.0* 1.5* 3.2* 1.3* 1.7*  PROT 6.0* 5.8* 4.6* 4.9* 5.7*  ALBUMIN 2.0* 1.9* 2.1* 2.0* 2.3*   No results for input(s): LIPASE, AMYLASE in the last 168 hours. Recent Labs  Lab 09/13/20 1520  AMMONIA 45*   Coagulation Profile: Recent Labs  Lab 09/13/20 1045 09/14/20 0525 09/15/20 2300  INR 1.4* 1.5* 2.2*   Cardiac Enzymes: No results for input(s): CKTOTAL, CKMB, CKMBINDEX, TROPONINI in the last 168 hours. BNP (last 3 results) No results for input(s): PROBNP in the last 8760 hours. HbA1C: No results for input(s): HGBA1C in the last 72 hours. CBG: Recent Labs  Lab 09/15/20 2116 09/15/20 2152  GLUCAP 61* 86   Lipid Profile: No results for input(s): CHOL, HDL, LDLCALC, TRIG, CHOLHDL, LDLDIRECT in the last 72 hours. Thyroid Function Tests: No results for input(s): TSH, T4TOTAL, FREET4, T3FREE, THYROIDAB in the last 72 hours. Anemia Panel: No results for input(s): VITAMINB12, FOLATE, FERRITIN, TIBC, IRON, RETICCTPCT in the last 72 hours. Sepsis Labs: Recent Labs  Lab 09/14/20 0525 09/15/20 2052 09/15/20 2300 09/18/20 0843 09/18/20 1105  PROCALCITON 3.00  --  21.56 7.78  --   LATICACIDVEN  --  5.0* 5.3* 2.8* 3.1*    Recent Results (from the past 240 hour(s))  Culture, blood (routine x 2)     Status: Abnormal   Collection Time: 09/13/20 10:45 AM   Specimen: BLOOD  Result Value Ref Range Status   Specimen Description  Final    BLOOD RIGHT ANTECUBITAL Performed at [facility] [address], [place], [place] [address]    Special Requests   Final    BOTTLES DRAWN AEROBIC AND ANAEROBIC Blood Culture adequate volume Performed at [facility] [address], [place], [place] [address]    Culture  Setup Time   Final    IN BOTH AEROBIC AND ANAEROBIC BOTTLES GRAM POSITIVE COCCI CRITICAL RESULT CALLED TO, READ BACK  BY AND VERIFIED WITHSeleta Rhymes [facility] 09/13/20 0347 JDW Performed at [facility], [place] [address] [place], [place] [address]    Culture STAPHYLOCOCCUS AUREUS (A)  Final   Report Status 09/16/2020 FINAL  Final   Organism ID, Bacteria STAPHYLOCOCCUS AUREUS  Final      Susceptibility   Staphylococcus aureus - MIC*    CIPROFLOXACIN <=0.5 SENSITIVE Sensitive     ERYTHROMYCIN <=0.25 SENSITIVE Sensitive     GENTAMICIN <=0.5 SENSITIVE Sensitive     OXACILLIN <=0.25 SENSITIVE Sensitive     TETRACYCLINE <=1 SENSITIVE Sensitive     VANCOMYCIN <=0.5 SENSITIVE Sensitive     TRIMETH/SULFA <=10 SENSITIVE Sensitive     CLINDAMYCIN <=0.25 SENSITIVE Sensitive     RIFAMPIN <=0.5 SENSITIVE Sensitive     Inducible Clindamycin NEGATIVE Sensitive     * STAPHYLOCOCCUS AUREUS  Blood Culture ID Panel (Reflexed)     Status: Abnormal   Collection Time: 09/13/20 10:45 AM  Result Value Ref Range Status   Enterococcus faecalis NOT DETECTED NOT DETECTED Final   Enterococcus Faecium NOT DETECTED NOT DETECTED Final   Listeria monocytogenes NOT DETECTED NOT DETECTED Final   Staphylococcus species DETECTED (A) NOT DETECTED Final    Comment: CRITICAL RESULT CALLED TO, READ BACK BY AND VERIFIED WITH: E JACKSON PHARMD 09/13/20 0347 JDW    Staphylococcus aureus (BCID) DETECTED (A) NOT DETECTED Final    Comment: CRITICAL RESULT CALLED TO, READ BACK BY AND VERIFIED WITH: E JACKSON PHARMD 09/13/20 0347 JDW    Staphylococcus epidermidis NOT DETECTED NOT DETECTED Final   Staphylococcus lugdunensis NOT DETECTED NOT DETECTED Final   Streptococcus species NOT DETECTED NOT DETECTED Final   Streptococcus agalactiae NOT DETECTED NOT DETECTED Final   Streptococcus pneumoniae NOT DETECTED NOT DETECTED Final   Streptococcus pyogenes NOT DETECTED NOT DETECTED Final   A.calcoaceticus-baumannii NOT DETECTED NOT DETECTED Final   Bacteroides fragilis NOT DETECTED NOT DETECTED Final   Enterobacterales NOT DETECTED NOT DETECTED  Final   Enterobacter cloacae complex NOT DETECTED NOT DETECTED Final   Escherichia coli NOT DETECTED NOT DETECTED Final   Klebsiella aerogenes NOT DETECTED NOT DETECTED Final   Klebsiella oxytoca NOT DETECTED NOT DETECTED Final   Klebsiella pneumoniae NOT DETECTED NOT DETECTED Final   Proteus species NOT DETECTED NOT DETECTED Final   Salmonella species NOT DETECTED NOT DETECTED Final   Serratia marcescens NOT DETECTED NOT DETECTED Final   Haemophilus influenzae NOT DETECTED NOT DETECTED Final   Neisseria meningitidis NOT DETECTED NOT DETECTED Final   Pseudomonas aeruginosa NOT DETECTED NOT DETECTED Final   Stenotrophomonas maltophilia NOT DETECTED NOT DETECTED Final   Candida albicans NOT DETECTED NOT DETECTED Final   Candida auris NOT DETECTED NOT DETECTED Final   Candida glabrata NOT DETECTED NOT DETECTED Final   Candida krusei NOT DETECTED NOT DETECTED Final   Candida parapsilosis NOT DETECTED NOT DETECTED Final   Candida tropicalis NOT DETECTED NOT DETECTED Final   Cryptococcus neoformans/gattii NOT DETECTED NOT DETECTED Final   Meth resistant mecA/C and  MREJ NOT DETECTED NOT DETECTED Final    Comment: Performed at [facility], [place] [address] [place], [place] [address]  Culture, blood (routine x 2)     Status: Abnormal   Collection Time: 09/13/20 11:20 AM   Specimen: BLOOD  Result Value Ref Range Status   Specimen Description   Final    BLOOD RIGHT ANTECUBITAL Performed at [facility] [address] [place], [place] [address]    Special Requests   Final    BOTTLES DRAWN AEROBIC AND ANAEROBIC Blood Culture adequate volume Performed at [facility] [address], [place], [place] [address]    Culture  Setup Time   Final    IN BOTH AEROBIC AND ANAEROBIC BOTTLES GRAM POSITIVE COCCI CRITICAL VALUE NOTED.  VALUE IS CONSISTENT WITH PREVIOUSLY REPORTED AND CALLED VALUE.    Culture (A)  Final    STAPHYLOCOCCUS  AUREUS SUSCEPTIBILITIES PERFORMED ON PREVIOUS CULTURE WITHIN THE LAST 5 DAYS. Performed at [facility], [place] [address] [place], [place] [address]    Report Status 09/16/2020 FINAL  Final  Resp Panel by RT-PCR (Flu A&B, Covid) Nasopharyngeal Swab     Status: None   Collection Time: 09/13/20  2:01 PM   Specimen: Nasopharyngeal Swab; Nasopharyngeal(NP) swabs in vial transport medium  Result Value Ref Range Status   SARS Coronavirus 2 by RT PCR NEGATIVE NEGATIVE Final    Comment: (NOTE) SARS-CoV-2 target nucleic acids are NOT DETECTED.  The SARS-CoV-2 RNA is generally detectable in upper respiratory specimens during the acute phase of infection. The lowest concentration of SARS-CoV-2 viral copies this assay can detect is 138 copies/mL. A negative result does not preclude SARS-Cov-2 infection and should not be used as the sole basis for treatment or other patient management decisions. A negative result may occur with  improper specimen collection/handling, submission of specimen other than nasopharyngeal swab, presence of viral mutation(s) within the areas targeted by this assay, and inadequate number of viral copies(<138 copies/mL). A negative result must be combined with clinical observations, patient history, and epidemiological information. The expected result is Negative.  Fact Sheet for Patients:  EntrepreneurPulse.com.au  Fact Sheet for Healthcare Providers:  IncredibleEmployment.be  This test is no t yet approved or cleared by the [place] FDA and  has been authorized for detection and/or diagnosis of SARS-CoV-2 by FDA under an Emergency Use Authorization (EUA). This EUA will remain  in effect (meaning this test can be used) for the duration of the COVID-19 declaration under Section 564(b)(1) of the Act, 21 U.S.C.section 360bbb-3(b)(1), unless the authorization is terminated  or revoked sooner.       Influenza A by PCR  NEGATIVE NEGATIVE Final   Influenza B by PCR NEGATIVE NEGATIVE Final    Comment: (NOTE) The Xpert Xpress SARS-CoV-2/FLU/RSV plus assay is intended as an aid in the diagnosis of influenza from Nasopharyngeal swab specimens and should not be used as a sole basis for treatment. Nasal washings and aspirates are unacceptable for Xpert Xpress SARS-CoV-2/FLU/RSV testing.  Fact Sheet for Patients: EntrepreneurPulse.com.au  Fact Sheet for Healthcare Providers: IncredibleEmployment.be  This test is not yet approved or cleared by the [place] FDA and has been authorized for detection and/or diagnosis of SARS-CoV-2 by FDA under an Emergency Use Authorization (EUA). This EUA will remain in effect (meaning this test can be used) for the duration of the COVID-19 declaration under Section 564(b)(1) of the Act, 21 U.S.C. section 360bbb-3(b)(1), unless the authorization is terminated  or revoked.  Performed at [facility], [place] [address], [place], [place] [address]   Culture, blood (routine x 2)     Status: None (Preliminary result)   Collection Time: 09/14/20  9:23 AM   Specimen: BLOOD RIGHT HAND  Result Value Ref Range Status   Specimen Description   Final    BLOOD RIGHT HAND Performed at [facility] [address], [place], [place] [address]    Special Requests   Final    BOTTLES DRAWN AEROBIC AND ANAEROBIC Blood Culture adequate volume Performed at [facility] [address], [place], [place] [address]    Culture   Final    NO GROWTH 4 DAYS Performed at [facility], [place] [address], [place], [place] [address]    Report Status PENDING  Incomplete  Culture, blood (routine x 2)     Status: None (Preliminary result)   Collection Time: 09/14/20  9:24 AM   Specimen: BLOOD LEFT HAND  Result Value Ref Range Status   Specimen Description   Final    BLOOD LEFT HAND Performed at [facility] [address], [place], [place] [address]    Special Requests   Final    BOTTLES DRAWN AEROBIC ONLY Blood Culture adequate volume Performed at [facility] [address] [place], [place] [address]    Culture   Final    NO GROWTH 4 DAYS Performed at [facility], [place] [address] [place], [place] [address]    Report Status PENDING  Incomplete  Aerobic/Anaerobic Culture (surgical/deep wound)     Status: None (Preliminary result)   Collection Time: 09/14/20 12:37 PM   Specimen: Abscess  Result Value Ref Range Status   Specimen Description   Final    ABSCESS  LEFT RP Performed at [facility] [address] [place], [place] [address]    Special Requests   Final    Normal Performed at [facility], [place] [address] [place], [place] [address]    Gram Stain   Final    MODERATE WBC PRESENT, PREDOMINANTLY PMN ABUNDANT GRAM POSITIVE COCCI ABUNDANT GRAM NEGATIVE RODS    Culture   Final    ABUNDANT STAPHYLOCOCCUS AUREUS HOLDING FOR POSSIBLE ANAEROBE Performed at [facility], [place] [address], [place], [place] [address]    Report Status PENDING  Incomplete   Organism ID, Bacteria STAPHYLOCOCCUS AUREUS  Final      Susceptibility   Staphylococcus aureus - MIC*    CIPROFLOXACIN <=0.5 SENSITIVE Sensitive     ERYTHROMYCIN <=0.25 SENSITIVE Sensitive     GENTAMICIN <=0.5 SENSITIVE Sensitive     OXACILLIN <=0.25 SENSITIVE Sensitive     TETRACYCLINE <=1 SENSITIVE Sensitive     VANCOMYCIN <=0.5 SENSITIVE Sensitive     TRIMETH/SULFA <=10 SENSITIVE Sensitive     CLINDAMYCIN <=0.25 SENSITIVE Sensitive     RIFAMPIN <=0.5 SENSITIVE Sensitive     Inducible Clindamycin NEGATIVE Sensitive     * ABUNDANT STAPHYLOCOCCUS AUREUS  MRSA PCR Screening     Status: None   Collection Time: 09/15/20  3:22 AM   Specimen: Nasal Mucosa; Nasopharyngeal  Result Value Ref Range Status   MRSA by PCR NEGATIVE NEGATIVE  Final    Comment:        The GeneXpert MRSA Assay (FDA approved for NASAL specimens only), is one component of a comprehensive MRSA colonization surveillance program. It is not intended to  diagnose MRSA infection nor to guide or monitor treatment for MRSA infections. Performed at [facility], [place] [address], [place], [place] [address]      RN Pressure Injury Documentation:     Estimated body mass index is 26.92 kg/m as calculated from the following:   Height as of this encounter: _0  (1.778 m).   Weight as of this encounter: 85.1 kg.  Malnutrition Type:  Nutrition Problem: Increased nutrient needs Etiology: chronic illness  Malnutrition Characteristics:  Signs/Symptoms: estimated needs  Nutrition Interventions:  Interventions: Ensure Enlive (each supplement provides 350kcal and 20 grams of protein),MVI   Radiology Studies: US SCROTUM W/DOPPLER  Result Date: 09/17/2020 CLINICAL DATA:  Left scrotal pain and swelling for several days. EXAM: SCROTAL ULTRASOUND DOPPLER ULTRASOUND OF THE TESTICLES TECHNIQUE: Complete ultrasound examination of the testicles, epididymis, and other scrotal structures was performed. Color and spectral Doppler ultrasound were also utilized to evaluate blood flow to the testicles. COMPARISON:  CT on 09/13/2020 FINDINGS: Right testicle Measurements: 3.2 x 1.8 x 2.4 cm. No mass or microlithiasis visualized. Left testicle Measurements: 3.4 x 2.1 x 2.0 cm. No mass or microlithiasis visualized. Right epididymis:  Normal in size and appearance. Left epididymis:  Normal in size and appearance. Hydrocele:  None visualized. Varicocele:  None visualized. Pulsed Doppler interrogation of both testes demonstrates normal low resistance arterial and venous waveforms bilaterally. Marked asymmetric left scrotal wall thickening and fluid is seen, with increased blood flow on color Doppler ultrasound. This appears to be part of the much more widespread  process throughout the left pelvic and thigh soft tissues seen, as demonstrated on prior CT. IMPRESSION: No evidence of testicular mass or torsion. Marked asymmetric left scrotal wall thickening and fluid, with increased blood flow. This is likely due to soft tissue infection, and part of the much more widespread process in the left pelvis and thigh demonstrated on prior CT. Electronically Signed   By: Marlaine Hind M.D.   On: 09/17/2020 18:47   Scheduled Meds: . carvedilol  3.125 mg Oral BID WC  . Chlorhexidine Gluconate Cloth  6 each Topical Daily  . docusate sodium  100 mg Oral BID  . folic acid  1 mg Oral Daily  . lactulose  10 g Oral BID  . mouth rinse  15 mL Mouth Rinse BID  . metroNIDAZOLE  500 mg Oral Q8H  . multivitamin with minerals  1 tablet Oral Daily  . pantoprazole  40 mg Oral BID  . sertraline  25 mg Oral Daily  . sodium chloride flush  5 mL Intracatheter Q8H  . thiamine  100 mg Oral Daily   Continuous Infusions: .  ceFAZolin (ANCEF) IV Stopped (09/18/20 1532)  . sodium chloride    . sodium phosphate  Dextrose 5% IVPB 40 mL/hr at 09/18/20 1323    LOS: 5 days   Kerney Elbe, DO [facility] PAGER is on AMION  If 7PM-7AM, please contact night-coverage [email or web address]

## 2020-09-18 NOTE — Progress Notes (Signed)
PROGRESS NOTE    [name]  MRN:[id number] DOB: 12/25/1985 DOA: 09/13/2020 PCP: Howley, Desiree, PA   Brief Narrative:  HPI per Dr. Tyrone Kyle on 09/13/20 [name] is a 34 y.o. male with medical history significant of AVN of hip, HTN, GERD. Presenting with fatigue and left leg pain. Recent admission for COVID (08/22/20). Successfully completed therapy and was discharged on 08/29/20. At that time he was discharged to treatment for alcoholic hepatitis as well. He was ok for a few days after discharge, but he began to feel very tired and weak. He did not try any medicines to help. He tried rest. This progressed over these last 2 weeks. Also during this time, he began having left thigh and leg pain. He reports that the left thigh had become increasingly swollen and tender to touch. He has had a blister rupture on the inner thigh in the groin area. These symptoms increased through 2 days ago when he was no longer ago to walk without great pain. He saw his PCP yesterday who drew labs. He was called this morning by that team and advised to go to the ED immediately. He denies any other aggravating or alleviating factors.   ED Course: He was found to have a Hgb of 6.2. pRBCs were ordered. He was FOBT negative. CT imaging showed left iliopsoas and left gluteal abscesses w/ diffuse swelling and left hip effusion. [facility] was consulted. Patient was started on cefazolin and vanc. TRH was called for admission.   **Interim History  Patient now has an MSSA bacteremia so ID was consulted and blood cultures repeated an echocardiogram was obtained.  Because of his abscesses and his iliopsoas muscle and multiple normal abscesses in the legs he underwent 3 drain placement by IR.  Orthopedic surgery is evaluating and planning for surgical intervention 09/15/20.  Blood count is improved after 2 units of PRBCs and he will be getting more units today for his Left Hip Interposition Arthoplasty today.  Blood cultures  are no growth to date so far and he continues to be on IV cefazolin however infectious diseases recommending a TEE so we will contact cardiology to set this up will be done per their scheduling  After his left hip total plasty with antibiotic spacer placement and debridement he had septic shock and decompensated and had hypotension despite 2 unit PRBCs, 2.2 L of crystalloid and 500 mL of albumin.  He was admitted to the ICU and started on peripheral pressors and critical care assumed care for 09/16/2020 and now is back on [facility] service.  He appears volume overloaded from all the fluid that he is gotten and has hepatic cirrhosis so likely has some anasarca so we will give him a dose of IV Lasix today.    He continues to be volume overloaded so was given another dose of IV Lasix however his lactic acid level started trending slightly upwards and went from 2.83.1 so was given a 250 mL bolus.  PT OT recommending CIR.  Scrotal ultrasound was done given his scrotal swelling and did not show any evidence of any testicular mass or torsion but did show asymmetric left scrotal wall thickening with fluid and increased blood so due to soft tissue infection related to his more likely widespread process in the left pelvis and thigh.  We will need to continue to monitor this for abscess formation but continue antibiotics with IV cefazolin and ID adding Flagyl for Anaerobic Coverage .  His beta-blocker was resumed   today as well given that he was tachycardic and likely had a rebound tachycardia.  Assessment & Plan:   Principal Problem:   MSSA bacteremia Active Problems:   Alcohol dependence (HCC)   Alcoholic hepatitis   Sepsis (HCC)   Septic arthritis (HCC)   Iliopsoas abscess on left (HCC)  Iliopsoas and left gluteal abscesses associated with MSSA Bacteremia  Septic shock secondary to above and left hip septic arthritis Left hip effusion and septic arthritis in the setting of avascular necrosis -Admitted to  Inpatient Telemetry  -Initially started on IV vanc, cefepime; pharmacy to dose but changed to IV Cefazolin by ID -Repeat Blood Cx x2 and obtain ECHO; See below -Initial Blood Cx 09/13/20 showed Staph Aureus in Both sets of Cx's -Fluids had initially stopped but then he received 2.2 crystalloid boluses as well as 2 units of PRBCs postoperatively -Sepsis Criteria met with: tachycardia, tachypnea, lactic acid 3.3, and Leukocytosis was 10.6 source: abscesses as above as well as bacteremia and went into septic shock during this admission -PCT was 3.00 and trended up to 21.56 -> 7.78 -WBC has gone from 10.6 -> 8.3 -> 12.8 -> 31.6 -> 12.1 -> 15.1 -Lactic trended up to 5.3 and then trended down to 2.8 and is 3.1  -General surgery recs IR involvement for possible abscess drainage and for ortho to review ([facility] consulted IR, EDP has consulted ortho); appreciate assistance, will await further recs -Surgery had planned a total hip replacement early March and plan is to currently drain iliopsoas abscess today and regular pulse the patient for a left hip interposition arthroplasty tomorrow with Dr. Landau and will continue antibiotics and they will make the patient n.p.o. at midnight today -Interventional radiology 3 drains in place today via CT-guided placement with a 14 French drainage catheter into the left retroperitoneal abscess into 12 French drains in the medial and lateral abscesses with the anterior aspect of left thigh and there is a total of 1.3 L of purulent fluid that was aspirated following drain placement sent for evaluation -Interventional Radiology evaluated the drains and they are recommending continue drain management and continue every shift flushes and monitoring of output; he is going to go to the OR today and if the drains are remain and the following plan for this is to repeat a CT scan when his drain output is less than 10 cc/day to assess for possible removal -Drain Gram Stain showing Moderate  WBC, Abundant Gram Positive Cocci, and Abundant Gram Negative Rods with Cultures showing abundant Staph Aureus which is pan sensitive -Infectious diseases consulted and recommending changing antibiotic to cefazolin and following repeat echocardiogram; ECHO as below did not mention any vegetation; infectious diseases is now recommending the addition of Flagyl -Repeat blood cultures have been done and show NGTD at 4 Days ours and if TTE is negative they can recommend a transesophageal echocardiogram then but since his TTE was a difficult Study, ID Dr. Wallace recommending TEE given Disseminated MSSA infection so will discuss with Cardiology to do an this will be done per cardiology scheduling and likely this will be done Monday, 09/19/2020. -Continue PT OT evaluation and continue dressing changes per orthopedic surgery and VT prophylaxis further recommendations but currently this is held given his anemia and thrombocytopenia -PT/OT recommending CIR   Symptomatic Anemia Macrocytic Anemia -Presented with Hgb/Hct of 6.2/18.9 -Patient is s/p 2 units pRBCs and improved but once he went to the OR postoperatively he dropped to 6.4/90.5 and then was given 2 units of PRBCs   and trended up to 7.9/20.3 and now is 7.5/22.5 yesterday and is 9.8/30.2 -FOBT negative -Anemia panel done and showed an iron level of 44, U IBC 131, TIBC 175, saturation ratios of 25% -check LDH and was 308, haptoglobin pending, iron studies showed an iron level of 44, U IBC 131, TIBC 175, saturation ratios of 25% -Continue to monitor for signs and symptoms of bleeding; currently no overt bleeding noted next- -repeat CBC in a.m.  Hx of Alcoholic Hepatitis Hepatic Cirrhosis Hyperbilirubinemia, was improving and now worsening  Mildly abnormal AST -Was continiung Prednisolone (he is on 40mg through 09/26/20 and then tapers) but given his disseminated MSSA bacteremia will discontinue taper given LFTs being improved and because T Bili Trending  down and is gone from 3.2 -> 1.3 -> 1.7. I spoke with Dr. Karki of Gastroenterology who feels the patient does not need taper now that this has been discontinued -Check PT-INR in the AM and CMP again in the AM  -Patient and family report he has been alcohol free for 1 month -Continue lactulose 10 g p.o. twice daily, thiamine, magnesium -Patient's ammonia level was 45 yesterday not repeated today but will repeat again in the morning as well as continuing to monitor his PT and INR but this was not done today -Patient was given IV fluid hydration with LR and will stop given his hepatic cirrhosis and poor albumin status; Given 500 mL Bolus as his Lactic Acid Level was elevated -AST is slightly elevated at 43 and slightly worsened to 61 -Continue monitor hepatic function daily and if worsening will call GI for further assistance but at this time will discontinue the prednisone taper   Hx of Tachycardia; Now in Sinus Tachycardia  HTN  -Baseline heart rate is anywhere between 100-110 per family -Continue Carvedilol 3.125 mg p.o. twice daily (held due to hypotension but now resumed  GAD -Continue sertraline 25 mg p.o. nightly  GERD -C/w Pantoprazole 40 mg p.o. twice daily  Hyponatremia -Was initiated on IV fluids with normal saline but this is now stopped  -Sodium is now 129 -Will give a dose of Sodium Phos IV -We will give another dose of IV Lasix (Given yesterday);  -Continue to monitor and trend and repeat CMP in a.m.  Metabolic Acidosis -Mild and is now improved -Patient CO2 is now 23, anion gap is 8, chloride level is 98 -IV fluid hydration was resumed by the ICU team given his septic shock but will now discontinue given his volume overload and anasarca -Continue to monitor and trend and repeat CMP in a.m.  Hypophosphatemia -Patient's phosphorus level is 1.6 today -Replete with IV Sodium Phos 20 mmol  -Continue to monitor and replete as necessary -Repeat CBC in a.m.  Scrotal  Edema and Anasarca -Patient is positive +4.343 L since admission -In the setting of his hepatic cirrhosis and IV fluid resuscitation- -Stopped IV fluids and recommend scrotal elevation and sling and obtain a scrotal ultrasound and Doppler to rule out any pathology -Scrotal U/S done and showed "No evidence of testicular mass or torsion. Marked asymmetric left scrotal wall thickening and fluid, with increased blood flow. This is likely due to soft tissue infection, and part of the much more widespread process in the left pelvis and thigh demonstrated on prior CT." -Continue Foley for today and will give another dose of IV Lasix 40 mg x 1 again today -Foley to be removed as patient is getting up with Therapy  -Trial of void early in the morning and   discontinue Foley at that time  Thrombocytopenia  -Patient's platelet count had dropped from 170,000 and could be in the setting of his cirrhosis and surgical issues and he is to count is now 72,000 yesterday and today is 86,000 -Continue to monitor for signs and symptoms of bleeding; currently no overt bleeding noted -Repeat CBC in a.m.  DVT prophylaxis: SCDs Code Status: FULL CODE  Family Communication: Discussed with family at bedside Disposition Plan: Pending further clinical improvement and evaluation and clearance by specialist  Status is: Inpatient  Remains inpatient appropriate because:Unsafe d/c plan, IV treatments appropriate due to intensity of illness or inability to take PO and Inpatient level of care appropriate due to severity of illness   Dispo: The patient is from: Home              Anticipated d/c is to: TBD; PT OT to evaluate and treat              Anticipated d/c date is: 3 days              Patient currently is not medically stable to d/c.   Difficult to place patient No   Consultants:   General Surgery  Orthopedic Surgery  Infectious Diseases  Interventional Radiology  Case was discussed with Gastroenterology     Procedures:  ECHOCARDIOGRAM IMPRESSIONS    1. Left ventricular ejection fraction, by estimation, is 60 to 65%. The  left ventricle has normal function. The left ventricle has no regional  wall motion abnormalities. There is moderate concentric left ventricular  hypertrophy. Left ventricular  diastolic function could not be evaluated. Elevated left ventricular  end-diastolic pressure.  2. Right ventricular systolic function is normal. The right ventricular  size is normal. Tricuspid regurgitation signal is inadequate for assessing  PA pressure.  3. The mitral valve is normal in structure. No evidence of mitral valve  regurgitation. No evidence of mitral stenosis.  4. The aortic valve is normal in structure. Aortic valve regurgitation is  not visualized. No aortic stenosis is present.  5. The inferior vena cava is normal in size with greater than 50%  respiratory variability, suggesting right atrial pressure of 3 mmHg.  6. Trivial pericardial effusion is present. The pericardial effusion is  circumferential.   FINDINGS  Left Ventricle: Left ventricular ejection fraction, by estimation, is 60  to 65%. The left ventricle has normal function. The left ventricle has no  regional wall motion abnormalities. The left ventricular internal cavity  size was normal in size. There is  moderate concentric left ventricular hypertrophy. Left ventricular  diastolic function could not be evaluated. Elevated left ventricular  end-diastolic pressure.   Right Ventricle: The right ventricular size is normal. No increase in  right ventricular wall thickness. Right ventricular systolic function is  normal. Tricuspid regurgitation signal is inadequate for assessing PA  pressure.   Left Atrium: Left atrial size was normal in size.   Right Atrium: Right atrial size was normal in size.   Pericardium: Trivial pericardial effusion is present. The pericardial  effusion is circumferential.    Mitral Valve: The mitral valve is normal in structure. No evidence of  mitral valve regurgitation. No evidence of mitral valve stenosis.   Tricuspid Valve: The tricuspid valve is normal in structure. Tricuspid  valve regurgitation is mild . No evidence of tricuspid stenosis.   Aortic Valve: The aortic valve is normal in structure. Aortic valve  regurgitation is not visualized. No aortic stenosis is present.   Pulmonic   Valve: The pulmonic valve was normal in structure. Pulmonic valve  regurgitation is not visualized. No evidence of pulmonic stenosis.   Aorta: The aortic root is normal in size and structure.   Venous: The inferior vena cava is normal in size with greater than 50%  respiratory variability, suggesting right atrial pressure of 3 mmHg.   IAS/Shunts: No atrial level shunt detected by color flow Doppler.     LEFT VENTRICLE  PLAX 2D  LVIDd:     4.70 cm   Diastology  LVIDs:     2.80 cm   LV e' medial:  3.81 cm/s  LV PW:     1.50 cm   LV E/e' medial: 31.0  LV IVS:    1.40 cm   LV e' lateral:  6.64 cm/s  LVOT diam:   2.30 cm   LV E/e' lateral: 17.8  LV SV:     78  LV SV Index:  39  LVOT Area:   4.15 cm    LV Volumes (MOD)  LV vol d, MOD A2C: 69.7 ml  LV vol d, MOD A4C: 115.0 ml  LV vol s, MOD A2C: 25.9 ml  LV vol s, MOD A4C: 47.1 ml  LV SV MOD A2C:   43.8 ml  LV SV MOD A4C:   115.0 ml  LV SV MOD BP:   51.8 ml   IVC  IVC diam: 1.50 cm   LEFT ATRIUM      Index    RIGHT ATRIUM      Index  LA diam:   3.80 cm 1.93 cm/m RA Area:   12.30 cm  LA Vol (A2C): 39.5 ml 20.08 ml/m RA Volume:  23.60 ml 12.00 ml/m  LA Vol (A4C): 32.6 ml 16.57 ml/m  AORTIC VALVE  LVOT Vmax:  130.00 cm/s  LVOT Vmean: 86.000 cm/s  LVOT VTI:  0.187 m    AORTA  Ao Root diam: 3.30 cm  Ao Asc diam: 3.20 cm   MITRAL VALVE  MV Area (PHT): 5.84 cm   SHUNTS  MV Decel Time: 130 msec   Systemic VTI: 0.19  m  MV E velocity: 118.00 cm/s Systemic Diam: 2.30 cm   DRAIN PLACEMENT  Septic effusion of the left hip and associated RP and thigh abscesses Post procedural Dx: Same  Technically successful US and CT guided placement of a 14 Fr drainage catheter into left retroperitoneal abscess and two 12 Fr drains into medial and lateral abscesses within the anterior aspect of the left thigh.  A total of 1.3 L of purulent fluid was aspirated following drain placement. A representative aspirated sample was capped and sent to the laboratory for analysis.    Antimicrobials:  Anti-infectives (From admission, onward)   Start     Dose/Rate Route Frequency Ordered Stop   09/18/20 1400  metroNIDAZOLE (FLAGYL) tablet 500 mg        500 mg Oral Every 8 hours 09/18/20 0927     09/15/20 1715  tobramycin (NEBCIN) powder  Status:  Discontinued          As needed 09/15/20 1715 09/15/20 2043   09/15/20 1715  vancomycin (VANCOCIN) powder  Status:  Discontinued          As needed 09/15/20 1716 09/15/20 2043   09/14/20 0600  ceFAZolin (ANCEF) IVPB 2g/100 mL premix        2 g 200 mL/hr over 30 Minutes Intravenous Every 8 hours 09/14/20 0359     09/13/20 2200  vancomycin (VANCOREADY)   IVPB 1500 mg/300 mL  Status:  Discontinued        1,500 mg 150 mL/hr over 120 Minutes Intravenous Every 12 hours 09/13/20 1636 09/14/20 0359   09/13/20 1500  ceFEPIme (MAXIPIME) 2 g in sodium chloride 0.9 % 100 mL IVPB  Status:  Discontinued        2 g 200 mL/hr over 30 Minutes Intravenous Every 8 hours 09/13/20 1437 09/14/20 0359   09/13/20 1130  vancomycin (VANCOREADY) IVPB 1750 mg/350 mL        1,750 mg 175 mL/hr over 120 Minutes Intravenous  Once 09/13/20 1107 09/13/20 1414   09/13/20 1045  ceFAZolin (ANCEF) IVPB 1 g/50 mL premix        1 g 100 mL/hr over 30 Minutes Intravenous  Once 09/13/20 1031 09/13/20 1307        Subjective: Seen and examined at bedside he he states that he was feeling well today.  States his left leg  and hip was hurting.  Continues to have significant left leg swelling as well as some right leg swelling now.  Scrotum is extremely swollen and I discussed with him that there is no evidence of any torsion or testicular mass noted and likely from the infection.  He was recommended to have elevation of his extremities.  PT OT recommending CIR so they will be consulted.  He remains volume overloaded so given a dose of IV Lasix but then also had a mild increase in lactic acidosis so was given a 250 mL bolus.  We will need to continue monitor his labs carefully.  No other concerns or complaints at this time.  Objective: Vitals:   09/18/20 0900 09/18/20 1000 09/18/20 1100 09/18/20 1200  BP: 136/68 126/89 121/71 125/62  Pulse: (!) 131 (!) 126 (!) 121 (!) 116  Resp: (!) 24 20 20 19  Temp:    98.6 F (37 C)  TempSrc:    Axillary  SpO2: 98% (!) 88% 99% 96%  Weight:      Height:        Intake/Output Summary (Last 24 hours) at 09/18/2020 1616 Last data filed at 09/18/2020 1323 Gross per 24 hour  Intake 866.33 ml  Output 2040 ml  Net -1173.67 ml   Filed Weights   09/13/20 0905 09/15/20 2111  Weight: 78.9 kg 85.1 kg   Examination: Physical Exam:  Constitutional: WN/WD overweight Caucasian male currently no acute distress appears a little uncomfortable and in pain today. Eyes: Lids and conjunctivae normal, sclerae anicteric  ENMT: External Ears, Nose appear normal. Grossly normal hearing. Neck: Appears normal, supple, no cervical masses, normal ROM, no appreciable thyromegaly neck: No appreciable JVD Respiratory: Diminished to auscultation bilaterally with coarse breath sounds, no wheezing, rales, rhonchi or crackles. Normal respiratory effort and patient is not tachypenic. No accessory muscle use.  Not wearing any supplemental oxygen via nasal cannula Cardiovascular: Tachycardic rate but regular rhythm with heart rates in the 120s, no murmurs / rubs / gallops. S1 and S2 auscultated.  Has 1-2+  lower extremity pitting edema worse on left compared to the right Abdomen: Soft, non-tender, distended secondary to body habitus.  Bowel sounds positive.  GU: Scrotum is extremely swollen Foley catheter has been removed now. Musculoskeletal: No clubbing / cyanosis of digits/nails.  Has 3 drains in the left leg with some extreme swelling.   Skin: No rashes, lesions, ulcers on limited skin evaluation. No induration; Warm and dry.  Neurologic: CN 2-12 grossly intact with no focal deficits. Romberg sign   and cerebellar reflexes not assessed.  Psychiatric: Normal judgment and insight. Alert and oriented x 3. Normal mood and appropriate affect.   Data Reviewed: I have personally reviewed following labs and imaging studies  CBC: Recent Labs  Lab 09/13/20 1045 09/13/20 1821 09/15/20 0450 09/15/20 1745 09/15/20 2052 09/16/20 0238 09/16/20 1419 09/17/20 0511 09/18/20 0303  WBC 10.6*   < > 12.8*  --  24.4* 31.6*  --  12.1* 15.1*  NEUTROABS 8.4*  --  10.2*  --   --  26.2*  --  9.6* 12.2*  HGB 6.2*   < > 8.0*   < > 8.0* 6.4* 7.9* 7.5* 9.8*  HCT 18.9*   < > 24.4*   < > 24.0* 19.5* 23.3* 22.5* 30.2*  MCV 106.2*   < > 103.4*  --  97.2 98.5  --  96.6 97.7  PLT 170   < > 139*  --  86* 84*  --  72* 86*   < > = values in this interval not displayed.   Basic Metabolic Panel: Recent Labs  Lab 09/15/20 0450 09/15/20 1745 09/15/20 2300 09/16/20 0238 09/17/20 0511 09/18/20 0303  NA 128* 130* 132* 133* 131* 129*  K 3.8 4.5 4.5 4.7 4.3 4.4  CL 98  --  103 104 102 98  CO2 20*  --  18* 17* 22 23  GLUCOSE 100*  --  105* 127* 127* 91  BUN 12  --  16 16 13 10  CREATININE 0.54*  --  0.67 0.58* 0.38* 0.44*  CALCIUM 7.8*  --  7.1* 7.0* 7.2* 7.5*  MG 1.8  --  2.0 1.9 2.0 1.9  PHOS 3.2  --  4.0 4.4 1.6* 1.6*   GFR: Estimated Creatinine Clearance: 131.8 mL/min (A) (by C-G formula based on SCr of 0.44 mg/dL (L)). Liver Function Tests: Recent Labs  Lab 09/14/20 0525 09/15/20 0450 09/15/20 2300  09/17/20 0511 09/18/20 0303  AST 30 42* 38 43* 61*  ALT 32 29 16 16 23  ALKPHOS 130* 191* 123 165* 270*  BILITOT 2.0* 1.5* 3.2* 1.3* 1.7*  PROT 6.0* 5.8* 4.6* 4.9* 5.7*  ALBUMIN 2.0* 1.9* 2.1* 2.0* 2.3*   No results for input(s): LIPASE, AMYLASE in the last 168 hours. Recent Labs  Lab 09/13/20 1520  AMMONIA 45*   Coagulation Profile: Recent Labs  Lab 09/13/20 1045 09/14/20 0525 09/15/20 2300  INR 1.4* 1.5* 2.2*   Cardiac Enzymes: No results for input(s): CKTOTAL, CKMB, CKMBINDEX, TROPONINI in the last 168 hours. BNP (last 3 results) No results for input(s): PROBNP in the last 8760 hours. HbA1C: No results for input(s): HGBA1C in the last 72 hours. CBG: Recent Labs  Lab 09/15/20 2116 09/15/20 2152  GLUCAP 61* 86   Lipid Profile: No results for input(s): CHOL, HDL, LDLCALC, TRIG, CHOLHDL, LDLDIRECT in the last 72 hours. Thyroid Function Tests: No results for input(s): TSH, T4TOTAL, FREET4, T3FREE, THYROIDAB in the last 72 hours. Anemia Panel: No results for input(s): VITAMINB12, FOLATE, FERRITIN, TIBC, IRON, RETICCTPCT in the last 72 hours. Sepsis Labs: Recent Labs  Lab 09/14/20 0525 09/15/20 2052 09/15/20 2300 09/18/20 0843 09/18/20 1105  PROCALCITON 3.00  --  21.56 7.78  --   LATICACIDVEN  --  5.0* 5.3* 2.8* 3.1*    Recent Results (from the past 240 hour(s))  Culture, blood (routine x 2)     Status: Abnormal   Collection Time: 09/13/20 10:45 AM   Specimen: BLOOD  Result Value Ref Range Status   Specimen Description     Final    BLOOD RIGHT ANTECUBITAL Performed at [facility], [address] [place], [place] [address]    Special Requests   Final    BOTTLES DRAWN AEROBIC AND ANAEROBIC Blood Culture adequate volume Performed at [facility], [address] [place], [place] [address]    Culture  Setup Time   Final    IN BOTH AEROBIC AND ANAEROBIC BOTTLES GRAM POSITIVE COCCI CRITICAL RESULT CALLED TO, READ BACK  BY AND VERIFIED WITH: E JACKSON PHARMD 09/13/20 0347 JDW Performed at  [facility], [address] [place], [place] [address]    Culture STAPHYLOCOCCUS AUREUS (A)  Final   Report Status 09/16/2020 FINAL  Final   Organism ID, Bacteria STAPHYLOCOCCUS AUREUS  Final      Susceptibility   Staphylococcus aureus - MIC*    CIPROFLOXACIN <=0.5 SENSITIVE Sensitive     ERYTHROMYCIN <=0.25 SENSITIVE Sensitive     GENTAMICIN <=0.5 SENSITIVE Sensitive     OXACILLIN <=0.25 SENSITIVE Sensitive     TETRACYCLINE <=1 SENSITIVE Sensitive     VANCOMYCIN <=0.5 SENSITIVE Sensitive     TRIMETH/SULFA <=10 SENSITIVE Sensitive     CLINDAMYCIN <=0.25 SENSITIVE Sensitive     RIFAMPIN <=0.5 SENSITIVE Sensitive     Inducible Clindamycin NEGATIVE Sensitive     * STAPHYLOCOCCUS AUREUS  Blood Culture ID Panel (Reflexed)     Status: Abnormal   Collection Time: 09/13/20 10:45 AM  Result Value Ref Range Status   Enterococcus faecalis NOT DETECTED NOT DETECTED Final   Enterococcus Faecium NOT DETECTED NOT DETECTED Final   Listeria monocytogenes NOT DETECTED NOT DETECTED Final   Staphylococcus species DETECTED (A) NOT DETECTED Final    Comment: CRITICAL RESULT CALLED TO, READ BACK BY AND VERIFIED WITH: E JACKSON PHARMD 09/13/20 0347 JDW    Staphylococcus aureus (BCID) DETECTED (A) NOT DETECTED Final    Comment: CRITICAL RESULT CALLED TO, READ BACK BY AND VERIFIED WITH: E JACKSON PHARMD 09/13/20 0347 JDW    Staphylococcus epidermidis NOT DETECTED NOT DETECTED Final   Staphylococcus lugdunensis NOT DETECTED NOT DETECTED Final   Streptococcus species NOT DETECTED NOT DETECTED Final   Streptococcus agalactiae NOT DETECTED NOT DETECTED Final   Streptococcus pneumoniae NOT DETECTED NOT DETECTED Final   Streptococcus pyogenes NOT DETECTED NOT DETECTED Final   A.calcoaceticus-baumannii NOT DETECTED NOT DETECTED Final   Bacteroides fragilis NOT DETECTED NOT DETECTED Final   Enterobacterales NOT DETECTED NOT DETECTED  Final   Enterobacter cloacae complex NOT DETECTED NOT DETECTED Final   Escherichia coli NOT DETECTED NOT DETECTED Final   Klebsiella aerogenes NOT DETECTED NOT DETECTED Final   Klebsiella oxytoca NOT DETECTED NOT DETECTED Final   Klebsiella pneumoniae NOT DETECTED NOT DETECTED Final   Proteus species NOT DETECTED NOT DETECTED Final   Salmonella species NOT DETECTED NOT DETECTED Final   Serratia marcescens NOT DETECTED NOT DETECTED Final   Haemophilus influenzae NOT DETECTED NOT DETECTED Final   Neisseria meningitidis NOT DETECTED NOT DETECTED Final   Pseudomonas aeruginosa NOT DETECTED NOT DETECTED Final   Stenotrophomonas maltophilia NOT DETECTED NOT DETECTED Final   Candida albicans NOT DETECTED NOT DETECTED Final   Candida auris NOT DETECTED NOT DETECTED Final   Candida glabrata NOT DETECTED NOT DETECTED Final   Candida krusei NOT DETECTED NOT DETECTED Final   Candida parapsilosis NOT DETECTED NOT DETECTED Final   Candida tropicalis NOT DETECTED NOT DETECTED Final   Cryptococcus neoformans/gattii NOT DETECTED NOT DETECTED Final   Meth resistant mecA/C and   MREJ NOT DETECTED NOT DETECTED Final    Comment: Performed at [facility], [address] [place], [place] [address]  Culture, blood (routine x 2)     Status: Abnormal   Collection Time: 09/13/20 11:20 AM   Specimen: BLOOD  Result Value Ref Range Status   Specimen Description   Final    BLOOD RIGHT ANTECUBITAL Performed at [facility], [address] [place], [place] [address]    Special Requests   Final    BOTTLES DRAWN AEROBIC AND ANAEROBIC Blood Culture adequate volume [facility] [address] [place], [place] [address]    Culture  Setup Time   Final    IN BOTH AEROBIC AND ANAEROBIC BOTTLES GRAM POSITIVE COCCI CRITICAL VALUE NOTED.  VALUE IS CONSISTENT WITH PREVIOUSLY REPORTED AND CALLED VALUE.    Culture (A)  Final    STAPHYLOCOCCUS  AUREUS SUSCEPTIBILITIES PERFORMED ON PREVIOUS CULTURE WITHIN THE LAST 5 DAYS. Performed at [facility], [address] [place],  [address]    Report Status 09/16/2020 FINAL  Final  Resp Panel by RT-PCR (Flu A&B, Covid) Nasopharyngeal Swab     Status: None   Collection Time: 09/13/20  2:01 PM   Specimen: Nasopharyngeal Swab; Nasopharyngeal(NP) swabs in vial transport medium  Result Value Ref Range Status   SARS Coronavirus 2 by RT PCR NEGATIVE NEGATIVE Final    Comment: (NOTE) SARS-CoV-2 target nucleic acids are NOT DETECTED.  The SARS-CoV-2 RNA is generally detectable in upper respiratory specimens during the acute phase of infection. The lowest concentration of SARS-CoV-2 viral copies this assay can detect is 138 copies/mL. A negative result does not preclude SARS-Cov-2 infection and should not be used as the sole basis for treatment or other patient management decisions. A negative result may occur with  improper specimen collection/handling, submission of specimen other than nasopharyngeal swab, presence of viral mutation(s) within the areas targeted by this assay, and inadequate number of viral copies(<138 copies/mL). A negative result must be combined with clinical observations, patient history, and epidemiological information. The expected result is Negative.  Fact Sheet for Patients:  https://www.fda.gov/media/152166/download  Fact Sheet for Healthcare Providers:  https://www.fda.gov/media/152162/download  This test is no t yet approved or cleared by the [place] FDA and  has been authorized for detection and/or diagnosis of SARS-CoV-2 by FDA under an Emergency Use Authorization (EUA). This EUA will remain  in effect (meaning this test can be used) for the duration of the COVID-19 declaration under Section 564(b)(1) of the Act, 21 U.S.C.section 360bbb-3(b)(1), unless the authorization is terminated  or revoked sooner.       Influenza A by PCR  NEGATIVE NEGATIVE Final   Influenza B by PCR NEGATIVE NEGATIVE Final    Comment: (NOTE) The Xpert Xpress SARS-CoV-2/FLU/RSV plus assay is intended as an aid in the diagnosis of influenza from Nasopharyngeal swab specimens and should not be used as a sole basis for treatment. Nasal washings and aspirates are unacceptable for Xpert Xpress SARS-CoV-2/FLU/RSV testing.  Fact Sheet for Patients: https://www.fda.gov/media/152166/download  Fact Sheet for Healthcare Providers: https://www.fda.gov/media/152162/download  This test is not yet approved or cleared by the [place] FDA and has been authorized for detection and/or diagnosis of SARS-CoV-2 by FDA under an Emergency Use Authorization (EUA). This EUA will remain in effect (meaning this test can be used) for the duration of the COVID-19 declaration under Section 564(b)(1) of the Act, 21 U.S.C. section 360bbb-3(b)(1), unless the authorization is terminated   or revoked.  Performed at [facility], [address] [place], [place] [address]   Culture, blood (routine x 2)     Status: None (Preliminary result)   Collection Time: 09/14/20  9:23 AM   Specimen: BLOOD RIGHT HAND  Result Value Ref Range Status   Specimen Description   Final    BLOOD RIGHT HAND Performed at [facility], [address] [place], [place] [address]    Special Requests   Final    BOTTLES DRAWN AEROBIC AND ANAEROBIC Blood Culture adequate volume Performed at [facility], [address] [place], [place] [address]    Culture   Final    NO GROWTH 4 DAYS Performed at [facility], [address] [place], [place] [address]    Report Status PENDING  Incomplete  Culture, blood (routine x 2)     Status: None (Preliminary result)   Collection Time: 09/14/20  9:24 AM   Specimen: BLOOD LEFT HAND  Result Value Ref Range Status   Specimen Description   Final    BLOOD LEFT HAND Performed at [facility], [address] [place], [place] [address]    Special Requests   Final    BOTTLES DRAWN AEROBIC ONLY Blood Culture adequate volume Performed at [facility], [address] [place], [place] [address]    Culture   Final    NO GROWTH 4 DAYS Performed at [facility], [address] [place], [place] [address]    Report Status PENDING  Incomplete  Aerobic/Anaerobic Culture (surgical/deep wound)     Status: None (Preliminary result)   Collection Time: 09/14/20 12:37 PM   Specimen: Abscess  Result Value Ref Range Status   Specimen Description   Final    ABSCESS  LEFT RP Performed at [facility], [address] [place], [place] [address]    Special Requests   Final    Normal Performed at [facility], [address] [place], [place] [address]    Gram Stain   Final    MODERATE WBC PRESENT, PREDOMINANTLY PMN ABUNDANT GRAM POSITIVE COCCI ABUNDANT GRAM NEGATIVE RODS    Culture   Final    ABUNDANT STAPHYLOCOCCUS AUREUS HOLDING FOR POSSIBLE ANAEROBE Performed at [facility], [address] [place], [place] [address]    Report Status PENDING  Incomplete   Organism ID, Bacteria STAPHYLOCOCCUS AUREUS  Final      Susceptibility   Staphylococcus aureus - MIC*    CIPROFLOXACIN <=0.5 SENSITIVE Sensitive     ERYTHROMYCIN <=0.25 SENSITIVE Sensitive     GENTAMICIN <=0.5 SENSITIVE Sensitive     OXACILLIN <=0.25 SENSITIVE Sensitive     TETRACYCLINE <=1 SENSITIVE Sensitive     VANCOMYCIN <=0.5 SENSITIVE Sensitive     TRIMETH/SULFA <=10 SENSITIVE Sensitive     CLINDAMYCIN <=0.25 SENSITIVE Sensitive     RIFAMPIN <=0.5 SENSITIVE Sensitive     Inducible Clindamycin NEGATIVE Sensitive     * ABUNDANT STAPHYLOCOCCUS AUREUS  MRSA PCR Screening     Status: None   Collection Time: 09/15/20  3:22 AM   Specimen: Nasal Mucosa; Nasopharyngeal  Result Value Ref Range Status   MRSA by PCR NEGATIVE NEGATIVE  Final    Comment:        The GeneXpert MRSA Assay (FDA approved for NASAL specimens only), is one component of a comprehensive MRSA colonization surveillance program. It is not intended to   diagnose MRSA infection nor to guide or monitor treatment for MRSA infections. Performed at [facility], [address] [place], [place] [address]      RN Pressure Injury Documentation:     Estimated body mass index is 26.92 kg/m as calculated from the following:   Height as of this encounter: 5' 10" (1.778 m).   Weight as of this encounter: 85.1 kg.  Malnutrition Type:  Nutrition Problem: Increased nutrient needs Etiology: chronic illness  Malnutrition Characteristics:  Signs/Symptoms: estimated needs  Nutrition Interventions:  Interventions: Ensure Enlive (each supplement provides 350kcal and 20 grams of protein),MVI   Radiology Studies: US SCROTUM W/DOPPLER  Result Date: 09/17/2020 CLINICAL DATA:  Left scrotal pain and swelling for several days. EXAM: SCROTAL ULTRASOUND DOPPLER ULTRASOUND OF THE TESTICLES TECHNIQUE: Complete ultrasound examination of the testicles, epididymis, and other scrotal structures was performed. Color and spectral Doppler ultrasound were also utilized to evaluate blood flow to the testicles. COMPARISON:  CT on 09/13/2020 FINDINGS: Right testicle Measurements: 3.2 x 1.8 x 2.4 cm. No mass or microlithiasis visualized. Left testicle Measurements: 3.4 x 2.1 x 2.0 cm. No mass or microlithiasis visualized. Right epididymis:  Normal in size and appearance. Left epididymis:  Normal in size and appearance. Hydrocele:  None visualized. Varicocele:  None visualized. Pulsed Doppler interrogation of both testes demonstrates normal low resistance arterial and venous waveforms bilaterally. Marked asymmetric left scrotal wall thickening and fluid is seen, with increased blood flow on color Doppler ultrasound. This appears to be part of the much more widespread  process throughout the left pelvic and thigh soft tissues seen, as demonstrated on prior CT. IMPRESSION: No evidence of testicular mass or torsion. Marked asymmetric left scrotal wall thickening and fluid, with increased blood flow. This is likely due to soft tissue infection, and part of the much more widespread process in the left pelvis and thigh demonstrated on prior CT. Electronically Signed   By: John A Stahl M.D.   On: 09/17/2020 18:47   Scheduled Meds: . carvedilol  3.125 mg Oral BID WC  . Chlorhexidine Gluconate Cloth  6 each Topical Daily  . docusate sodium  100 mg Oral BID  . folic acid  1 mg Oral Daily  . lactulose  10 g Oral BID  . mouth rinse  15 mL Mouth Rinse BID  . metroNIDAZOLE  500 mg Oral Q8H  . multivitamin with minerals  1 tablet Oral Daily  . pantoprazole  40 mg Oral BID  . sertraline  25 mg Oral Daily  . sodium chloride flush  5 mL Intracatheter Q8H  . thiamine  100 mg Oral Daily   Continuous Infusions: .  ceFAZolin (ANCEF) IV Stopped (09/18/20 1532)  . sodium chloride    . sodium phosphate  Dextrose 5% IVPB 40 mL/hr at 09/18/20 1323    LOS: 5 days   Julizza Sassone Latif Benny Deutschman, DO [facility] PAGER is on AMION  If 7PM-7AM, please contact night-coverage [email or web address]   

## 2020-09-18 NOTE — Progress Notes (Signed)
Physical Therapy Treatment Patient Details Name: [name] MRN: [id number] DOB: [birth year] Today's Date: 09/18/2020    History of Present Illness [name] is a 35 y.o. male with medical history significant of AVN of hip ( originally was scheduled for a THA this coming March2022), HTN, GERD. Recent admission for COVID , acute renal failure and alcoholic hepatitis (08/22/20- 08/29/20). He was ok for a few days after discharge, but he began to feel very tired, weak, left thigh and leg pain and progressed over the last 2 weeks. He reports that the left thigh had become increasingly swollen and tender to touch. He has had a blister rupture on the inner thigh in the groin area. These symptoms increased through 2 days ago when he was no longer ago to walk without great pain. 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    PT Comments    Patient is very motivated to progress but remains limited by significant operative site pain, Lt LE and scrotal edema, and multiple weeping sores in Lt groin. Pt agreeable to work with PT/OT and set goal to use the [facility] this session. He required Max+2 assist for bed mobility with cues and assist to maintain posterior hip precautions. HR elevated to 130's sitting EOB with sats in >88% on RA. Mod+2 assist for sit<>stands from EOB and assist for Lt foot positioning. Pt unable to complete full stand step transfer to [facility] and bed swapped out with [facility] for safety. Pt's HR elevated 137-146 bpm while toileting and sats dropping; suspect due to straining and hand grip on armrest. Pt educated on breathing techniques and on importance of not straining. Pt  placed on 3L temporarily and O2 monitor replaced. Max HR noted with sit<>stand for return to bed, 156 bpm. EOS educated pt on exercises with Lt foot/toes for edema management and educated pt's girlfriend on retrograde massage for edema management. Pt is highly motivated but currently limited due to multiple medical factors. As pt's medical stability improves and he is able to advance mobility anticipate he will need intense follow up therapy. Recommendations being updated to intense therapy follow up at CIR level in order to maximize return of functional independence prior to return home. Pt has good support at home with assist available from his girlfriend. Acute PT will progress as able.   Follow Up Recommendations  CIR     Equipment Recommendations  Rolling walker with 5" wheels;3in1 (PT)    Recommendations for Other Services Rehab consult     Precautions / Restrictions Precautions Precautions: Fall;Posterior Hip Precaution Comments: watch HR, pt rests in 100-110's Restrictions Weight Bearing Restrictions: No LLE Weight Bearing: Weight bearing as tolerated Other Position/Activity Restrictions: 3 drains in Lt anterior thigh    Mobility  Bed Mobility Overal bed mobility: Needs Assistance Bed Mobility: Supine to Sit;Sit to Supine     Supine to sit: Max assist;+2 for physical assistance;+2 for safety/equipment;HOB elevated Sit to supine: Mod assist;+2 for safety/equipment;+2 for physical assistance   General bed mobility comments: Max +2 assist to pivot (helicopter) to EOB, Max assist to control Lt LE to EOB and pt using Rt LE some to scoot. Max assist to raise trunk upright and stabilize while maintaining posterior hip  precautions. Pt able to control trunk while lowering onto bed with posterior guarding for pt's girlfriend and Max assist for bil LE's to return to supine.    Transfers Overall transfer level: Needs assistance Equipment used: Standard walker Transfers: Sit to/from  [employer] Transfers Sit to Stand: Mod assist;+2 physical assistance;+2 safety/equipment;From elevated surface Stand pivot transfers: Mod assist;Max assist;+2 physical assistance;+2 safety/equipment       General transfer comment: Mod assist +2 for sit<>stands from EOB and BSC. VC's and assist required for Lt LE positioning to maintain posterior hip precautions. Pt attmpted stand step with walker to move to [facility], great difficulty stepping Lt LE and after 2 small steps bed moved out of the way and BSC placed directly behind pt to sit. After pt had successful BM pt stood and BSC removed and replaced with bed.  Ambulation/Gait             General Gait Details: pt unable to advance at this time, due to pain and pt tachycardic.   Stairs             Wheelchair Mobility    Modified Rankin (Stroke Patients Only)       Balance Overall balance assessment: Needs assistance Sitting-balance support: Feet unsupported;Bilateral upper extremity supported Sitting balance-Leahy Scale: Poor Sitting balance - Comments: reliant on bil UE support and close guard from therapist Postural control: Right lateral lean;Posterior lean (to reduce pain at Lt side) Standing balance support: Bilateral upper extremity supported Standing balance-Leahy Scale: Poor Standing balance comment: heavily reliant on bil UE support on walker and external support from therapist.                            Cognition Arousal/Alertness: Awake/alert Behavior During Therapy: WFL for tasks assessed/performed Overall Cognitive Status: Within Functional Limits for tasks assessed                                        Exercises Other Exercises Other Exercises: educated pt/girlfriend on retrograde massage for edema management to reduce swelling in Lt LE. Other Exercises: encouraged ankle pumps and toe curls/wiggling in bed.    General Comments General comments (skin integrity,  edema, etc.): patient's HR in 110's at rest supine at start of session. HR elevated to 130's at EOB and max of 156 bpm during transfer. Pt's HR in 137-146 while sitting on BSC. Sats dropping intermittently into low 80's but suspect poor read, at EOS sats 100% on 2L/min with new pulse ox monitor placed.      Pertinent Vitals/Pain Pain Assessment: Faces Faces Pain Scale: Hurts whole lot Pain Location: Lt hip and scrotal edema Pain Descriptors / Indicators: Discomfort;Grimacing;Operative site guarding Pain Intervention(s): Limited activity within patient's tolerance;Monitored during session;Repositioned    Home Living                      Prior Function            PT Goals (current goals can now be found in the care plan section) Acute Rehab PT Goals Patient Stated Goal: to be able to walk again PT Goal Formulation: With patient/family Time For Goal Achievement: 09/30/20 Potential to Achieve Goals: Good Progress towards PT goals: Progressing toward goals    Frequency    Min 4X/week      PT Plan Discharge plan needs  to be updated;Frequency needs to be updated    Co-evaluation PT/OT/SLP Co-Evaluation/Treatment: Yes Reason for Co-Treatment: For patient/therapist safety;To address functional/ADL transfers;Complexity of the patient's impairments (multi-system involvement) PT goals addressed during session: Mobility/safety with mobility;Balance;Proper use of DME        AM-PAC PT "6 Clicks" Mobility   Outcome Measure  Help needed turning from your back to your side while in a flat bed without using bedrails?: A Lot Help needed moving from lying on your back to sitting on the side of a flat bed without using bedrails?: A Lot Help needed moving to and from a bed to a chair (including a wheelchair)?: A Lot Help needed standing up from a chair using your arms (e.g., wheelchair or bedside chair)?: Total Help needed to walk in hospital room?: Total Help needed climbing 3-5  steps with a railing? : Total 6 Click Score: 9    End of Session Equipment Utilized During Treatment: Gait belt;Oxygen Activity Tolerance: Patient tolerated treatment well;Patient limited by pain Patient left: in bed;with call bell/phone within reach;with family/visitor present Nurse Communication: Mobility status (primo and anterior hip drain leaking) PT Visit Diagnosis: Other abnormalities of gait and mobility (R26.89);Muscle weakness (generalized) (M62.81);Pain Pain - Right/Left: Left Pain - part of body: Hip     Time: 1243-1352 (20 minutes not billable (pt sitting on BSC)) PT Time Calculation (min) (ACUTE ONLY): 69 min  Charges:  $Therapeutic Activity: 23-37 mins                     [name], [name] Acute Rehabilitation Services Office [phone] Pager [phone]      [name] 09/18/2020, 2:15 PM

## 2020-09-18 NOTE — Progress Notes (Signed)
   ORTHOPAEDIC PROGRESS NOTE  s/p Procedure(s): TOTAL HIP ARTHROPLASTY WITH CEMENT INTERPOSITION SPACING for septic hip arthritis from septicemia in the setting of alcoholic AVN of the left hip  SUBJECTIVE: Reports moderate pain about operative site. No chest pain. No SOB. No nausea/vomiting. No other complaints.  OBJECTIVE: PE:  Vitals:   09/18/20 0800 09/18/20 0900  BP: 124/72 136/68  Pulse: (!) 124 (!) 131  Resp: 20 (!) 24  Temp: 98.8 F (37.1 C)   SpO2: (!) 89% 98%     ASSESSMENT: [name] is a 35 y.o. male doing ok postoperatively.  PLAN: Weightbearing: WBAT LLE Insicional and dressing care: copious serous drainage from incision that is well approximated and has no redness Twice a day dressing changes to incision with gauze and abds.  Changed by me today with the assistance of RN. 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   Pain control: oxycodone Follow - up plan: CT scan ordered.  Will have interventional radiology review scan to determine if any other abscesses need IR to place drains Contact information:   Elmer Merwin A. Gwinda Passe Physician Assistant [facility] [phone]  09/18/2020, 10:25 AM  Patient ID: [name], male   DOB: 29-Dec-1985, 35 y.o.   MRN: [id number]

## 2020-09-18 NOTE — Evaluation (Signed)
Occupational Therapy Evaluation Patient Details Name: [name] MRN: [id number] DOB: 11-10-1985 Today's Date: 09/18/2020    History of Present Illness [name] is a 35 y.o. male with medical history significant of AVN of hip ( originally was scheduled for a THA this coming March2022), HTN, GERD. Recent admission for COVID , acute renal failure and alcoholic hepatitis (08/22/20- 08/29/20). He was ok for a few days after discharge, but he began to feel very tired, weak, left thigh and leg pain and progressed over the last 2 weeks. He reports that the left thigh had become increasingly swollen and tender to touch. He has had a blister rupture on the inner thigh in the groin area. These symptoms increased through 2 days ago when he was no longer ago to walk without great pain. 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   Clinical Impression   [name] is a 35 year old man with above medical history who presents with generalized weakness, decreased activity tolerance, impaired ROM and strength of LLE and significant pain resulting in a significant decline in functional abilities. Patient also limited by posterior hip precautions, 3 IR drains in thighs, scrotal edema, blisters and excoriation of periarea. Patient max x 2 to transfer to edge of bed and mod x 2 to stand. Able to take 1-2 steps but unable to complete full transfer to [facility] - so bed was moved and [facility] placed behind him. Patient tachycardic throughout with sustained HR in the 130s-140s and as high as 152. Patient requiring set up and seated position or bed level position for UB ADLs and total assist for  toileting, LB dressing and max assist for LB bathing. Patient will benefit from skilled OT services while in hospital to improve deficits and learn compensatory strategies as needed in order to improve functional abilities. Patient will benefit from aggressive short term rehab prior to discharge home.      Follow Up Recommendations       Equipment Recommendations  None recommended by OT    Recommendations for Other Services Rehab consult     Precautions / Restrictions Precautions Precautions: Fall;Posterior Hip Precaution Comments: watch HR, pt rests in 100-110's Restrictions Weight Bearing Restrictions: No LLE Weight Bearing: Weight bearing as tolerated Other Position/Activity Restrictions: 3 drains in Lt anterior thigh      Mobility Bed Mobility Overal bed mobility: Needs Assistance Bed Mobility: Supine to Sit;Sit to Supine     Supine to sit: Max assist;+2 for physical assistance;+2 for safety/equipment;HOB elevated Sit to supine: Mod assist;+2 for safety/equipment;+2 for physical assistance   General bed mobility comments: Max +2 assist to pivot (helicopter) to EOB, Max assist to control Lt LE to EOB and pt using Rt LE some to scoot. Max assist to raise trunk upright and stabilize while maintaining posterior hip precautions. Pt able to control trunk while lowering onto bed with posterior guarding for pt's girlfriend and Max assist for bil LE's to return to supine.    Transfers Overall transfer level: Needs assistance Equipment used: Standard walker Transfers: Sit to/from [employer] Transfers Sit to Stand: Mod assist;+2 physical assistance;+2 safety/equipment;From elevated surface Stand pivot transfers: Mod assist;Max assist;+2 physical assistance;+2 safety/equipment  General transfer comment: Mod assist +2 for sit<>stands from EOB and BSC. VC's and assist required for Lt LE positioning to maintain posterior hip precautions. Pt attmpted stand step with walker  to move to [facility], great difficulty stepping Lt LE and after 2 small steps bed moved out of the way and BSC placed directly behind pt to sit. After pt had successful BM pt stood and BSC removed and replaced with bed.    Balance Overall balance assessment: Needs assistance Sitting-balance support: Feet unsupported;Bilateral upper extremity supported Sitting balance-Leahy Scale: Poor Sitting balance - Comments: reliant on bil UE support and close guard from therapist Postural control: Right lateral lean;Posterior lean Standing balance support: Bilateral upper extremity supported Standing balance-Leahy Scale: Poor Standing balance comment: heavily reliant on bil UE support on walker and external support from therapist.                           ADL either performed or assessed with clinical judgement   ADL Overall ADL's : Needs assistance/impaired Eating/Feeding: Independent   Grooming: Set up;Sitting   Upper Body Bathing: Set up;Sitting   Lower Body Bathing: +2 for physical assistance;Maximal assistance;+2 for safety/equipment;Sit to/from stand   Upper Body Dressing : Set up;Sitting   Lower Body Dressing: Total assistance;+2 for physical assistance;+2 for safety/equipment;Sit to/from stand   Toilet Transfer: Moderate assistance;+2 for physical assistance;+2 for safety/equipment;BSC;Stand-pivot;RW   Toileting- Clothing Manipulation and Hygiene: Total assistance;Sit to/from stand               Vision Patient Visual Report: No change from baseline       Perception     Praxis      Pertinent Vitals/Pain Pain Assessment: Faces Faces Pain Scale: Hurts whole lot Pain Location: Lt hip and scrotal edema Pain Descriptors / Indicators: Discomfort;Grimacing;Operative site guarding Pain Intervention(s): Limited activity within patient's tolerance;Monitored during session;Repositioned     Hand Dominance Right   Extremity/Trunk Assessment Upper Extremity  Assessment Upper Extremity Assessment: Overall WFL for tasks assessed (reports right shoulder pain, probably impingement)   Lower Extremity Assessment Lower Extremity Assessment: Defer to PT evaluation       Communication Communication Communication: No difficulties   Cognition Arousal/Alertness: Awake/alert Behavior During Therapy: WFL for tasks assessed/performed Overall Cognitive Status: Within Functional Limits for tasks assessed                                     General Comments  patient's HR in 110's at rest supine at start of session. HR elevated to 130's at EOB and max of 156 bpm during transfer. Pt's HR in 137-146 while sitting on BSC. Sats dropping intermittently into low 80's but suspect poor read, at EOS sats 100% on 2L/min with new pulse ox monitor placed.    Exercises Exercises: Other exercises Other Exercises Other Exercises: educated pt/girlfriend on retrograde massage for edema management to reduce swelling in Lt LE. Other Exercises: encouraged ankle pumps and toe curls/wiggling in bed.   Shoulder Instructions      Home Living Family/patient expects to be discharged to:: Private residence Living Arrangements: Spouse/significant other Available Help at Discharge: Family Type of Home: House Home Access: Stairs to enter [employer] of Steps: 2 in front, 5 in back, but has a ramp in place in front now   Home Layout: One level     Bathroom Shower/Tub: Walk-in shower  Bathroom Toilet: Handicapped height     Home Equipment: Cane - single point;Crutches;Walker - 2 wheels;Bedside commode          Prior Functioning/Environment Level of Independence: Independent with assistive device(s);Needs assistance  Gait / Transfers Assistance Needed: since this started backin Jan he has had more and more difficultyw ith ambulation trying to use RW, but pain with the infection became so great he was having great amount of trouble walking. Prior  to that he was working and independent.     Comments: using cane, then progressed to crutches recently due to hip pain        OT Problem List: Decreased strength;Decreased range of motion;Decreased activity tolerance;Impaired balance (sitting and/or standing);Decreased knowledge of use of DME or AE;Decreased knowledge of precautions;Pain;Increased edema      OT Treatment/Interventions: Self-care/ADL training;Therapeutic exercise;DME and/or AE instruction;Therapeutic activities;Patient/family education    OT Goals(Current goals can be found in the care plan section) Acute Rehab OT Goals Patient Stated Goal: to be able to walk again and more independent OT Goal Formulation: With patient Time For Goal Achievement: 10/02/20 Potential to Achieve Goals: Good  OT Frequency: Min 2X/week   Barriers to D/C:            Co-evaluation PT/OT/SLP Co-Evaluation/Treatment: Yes Reason for Co-Treatment: For patient/therapist safety;To address functional/ADL transfers PT goals addressed during session: Mobility/safety with mobility OT goals addressed during session: ADL's and self-care      AM-PAC OT "6 Clicks" Daily Activity     Outcome Measure Help from another person eating meals?: None Help from another person taking care of personal grooming?: A Little Help from another person toileting, which includes using toliet, bedpan, or urinal?: Total Help from another person bathing (including washing, rinsing, drying)?: A Lot Help from another person to put on and taking off regular upper body clothing?: A Little Help from another person to put on and taking off regular lower body clothing?: Total 6 Click Score: 14   End of Session Equipment Utilized During Treatment: Gait belt;Rolling walker Nurse Communication: Mobility status  Activity Tolerance: Patient limited by pain Patient left: in bed;with call bell/phone within reach;with family/visitor present  OT Visit Diagnosis: Other  abnormalities of gait and mobility (R26.89);Muscle weakness (generalized) (M62.81);Pain Pain - Right/Left: Left Pain - part of body: Hip                Time: 0347-4259 OT Time Calculation (min): 68 min Charges:  OT General Charges $OT Visit: 1 Visit OT Evaluation $OT Eval Moderate Complexity: 1 Mod OT Treatments $Self Care/Home Management : 8-22 mins  Vanellope Passmore, OTR/L Acute Care Rehab Services  Office [phone] Pager: [phone]   Kelli Churn 09/18/2020, 2:35 PM

## 2020-09-18 NOTE — Progress Notes (Signed)
Date and time results received: 09/18/20 9:29 AM  Test: Lactic acid Critical Value: 2.8  Name of Provider Notified: Dr. Marland Mcalpine  Orders Received? Or Actions Taken?: Continue to monitor pt.

## 2020-09-18 NOTE — Progress Notes (Signed)
Inpatient Rehab Admissions Coordinator Note:   Per PT recommendation, pt was screened for CIR candidacy by Wolfgang Phoenix, MS, CCC-SLP.  At this time we are recommending an inpatient rehab consult.  AC will contact attending to request consult order.  Please contact me with questions.    Wolfgang Phoenix, MS, CCC-SLP Admissions Coordinator [phone] 09/18/20 2:33 PM

## 2020-09-18 NOTE — Progress Notes (Signed)
Paged Provider Linton Flemings to inform of patient's current tachycardia. Patient HR is now 120-130.

## 2020-09-19 ENCOUNTER — Inpatient Hospital Stay (HOSPITAL_COMMUNITY): Payer: 59 | Admitting: Anesthesiology

## 2020-09-19 ENCOUNTER — Inpatient Hospital Stay (HOSPITAL_COMMUNITY): Payer: 59

## 2020-09-19 ENCOUNTER — Encounter (HOSPITAL_COMMUNITY): Payer: Self-pay | Admitting: Pulmonary Disease

## 2020-09-19 ENCOUNTER — Encounter (HOSPITAL_COMMUNITY)
Admission: EM | Disposition: A | Discharge status: Rehabilitation Facility | Payer: Self-pay | Source: Home / Self Care | Attending: Internal Medicine

## 2020-09-19 DIAGNOSIS — R7881 Bacteremia: Secondary | ICD-10-CM | POA: Diagnosis not present

## 2020-09-19 DIAGNOSIS — K6812 Psoas muscle abscess: Secondary | ICD-10-CM | POA: Diagnosis not present

## 2020-09-19 DIAGNOSIS — B9561 Methicillin susceptible Staphylococcus aureus infection as the cause of diseases classified elsewhere: Secondary | ICD-10-CM | POA: Diagnosis not present

## 2020-09-19 DIAGNOSIS — K7010 Alcoholic hepatitis without ascites: Secondary | ICD-10-CM | POA: Diagnosis not present

## 2020-09-19 DIAGNOSIS — L02416 Cutaneous abscess of left lower limb: Secondary | ICD-10-CM | POA: Diagnosis not present

## 2020-09-19 DIAGNOSIS — K7030 Alcoholic cirrhosis of liver without ascites: Secondary | ICD-10-CM | POA: Diagnosis not present

## 2020-09-19 HISTORY — PX: TEE WITHOUT CARDIOVERSION: SHX5443

## 2020-09-19 HISTORY — PX: BUBBLE STUDY: SHX6837

## 2020-09-19 LAB — CBC WITH DIFFERENTIAL/PLATELET
Abs Immature Granulocytes: 0.79 10*3/uL — ABNORMAL HIGH (ref 0.00–0.07)
Basophils Absolute: 0 10*3/uL (ref 0.0–0.1)
Basophils Relative: 0 %
Eosinophils Absolute: 0.1 10*3/uL (ref 0.0–0.5)
Eosinophils Relative: 1 %
HCT: 26.6 % — ABNORMAL LOW (ref 39.0–52.0)
Hemoglobin: 8.8 g/dL — ABNORMAL LOW (ref 13.0–17.0)
Immature Granulocytes: 5 %
Lymphocytes Relative: 9 %
Lymphs Abs: 1.4 10*3/uL (ref 0.7–4.0)
MCH: 32.6 pg (ref 26.0–34.0)
MCHC: 33.1 g/dL (ref 30.0–36.0)
MCV: 98.5 fL (ref 80.0–100.0)
Monocytes Absolute: 0.4 10*3/uL (ref 0.1–1.0)
Monocytes Relative: 3 %
Neutro Abs: 11.8 10*3/uL — ABNORMAL HIGH (ref 1.7–7.7)
Neutrophils Relative %: 82 %
Platelets: 85 10*3/uL — ABNORMAL LOW (ref 150–400)
RBC: 2.7 MIL/uL — ABNORMAL LOW (ref 4.22–5.81)
RDW: 17.3 % — ABNORMAL HIGH (ref 11.5–15.5)
WBC: 14.5 10*3/uL — ABNORMAL HIGH (ref 4.0–10.5)
nRBC: 0.4 % — ABNORMAL HIGH (ref 0.0–0.2)

## 2020-09-19 LAB — COMPREHENSIVE METABOLIC PANEL
ALT: 16 U/L (ref 0–44)
AST: 39 U/L (ref 15–41)
Albumin: 2 g/dL — ABNORMAL LOW (ref 3.5–5.0)
Alkaline Phosphatase: 244 U/L — ABNORMAL HIGH (ref 38–126)
Anion gap: 9 (ref 5–15)
BUN: 14 mg/dL (ref 6–20)
CO2: 24 mmol/L (ref 22–32)
Calcium: 7.4 mg/dL — ABNORMAL LOW (ref 8.9–10.3)
Chloride: 96 mmol/L — ABNORMAL LOW (ref 98–111)
Creatinine, Ser: 0.41 mg/dL — ABNORMAL LOW (ref 0.61–1.24)
GFR, Estimated: >60 mL/min (ref >60)
Glucose, Bld: 100 mg/dL — ABNORMAL HIGH (ref 70–99)
Potassium: 4.4 mmol/L (ref 3.5–5.1)
Sodium: 129 mmol/L — ABNORMAL LOW (ref 135–145)
Total Bilirubin: 1.7 mg/dL — ABNORMAL HIGH (ref 0.3–1.2)
Total Protein: 5.2 g/dL — ABNORMAL LOW (ref 6.5–8.1)

## 2020-09-19 LAB — PROCALCITONIN: Procalcitonin: 4.14 ng/mL

## 2020-09-19 LAB — CULTURE, BLOOD (ROUTINE X 2)
Culture: NO GROWTH
Culture: NO GROWTH
Special Requests: ADEQUATE
Special Requests: ADEQUATE

## 2020-09-19 LAB — AEROBIC/ANAEROBIC CULTURE W GRAM STAIN (SURGICAL/DEEP WOUND): Special Requests: NORMAL

## 2020-09-19 LAB — MAGNESIUM: Magnesium: 1.9 mg/dL (ref 1.7–2.4)

## 2020-09-19 LAB — PROTIME-INR
INR: 1.5 — ABNORMAL HIGH (ref 0.8–1.2)
Prothrombin Time: 17.3 seconds — ABNORMAL HIGH (ref 11.4–15.2)

## 2020-09-19 LAB — PHOSPHORUS: Phosphorus: 3.4 mg/dL (ref 2.5–4.6)

## 2020-09-19 SURGERY — ECHOCARDIOGRAM, TRANSESOPHAGEAL
Anesthesia: Monitor Anesthesia Care

## 2020-09-19 MED ORDER — PROPOFOL 10 MG/ML IV BOLUS
INTRAVENOUS | Status: DC | PRN
  Administered 2020-09-19: 15 mg via INTRAVENOUS

## 2020-09-19 MED ORDER — FUROSEMIDE 10 MG/ML IJ SOLN
40.0000 mg | Freq: Once | INTRAMUSCULAR | Status: AC
  Administered 2020-09-19: 40 mg via INTRAVENOUS
  Filled 2020-09-19: qty 4

## 2020-09-19 MED ORDER — CARVEDILOL 6.25 MG PO TABS
6.2500 mg | ORAL_TABLET | Freq: Two times a day (BID) | ORAL | Status: DC
  Administered 2020-09-20 – 2020-10-01 (×21): 6.25 mg via ORAL
  Filled 2020-09-19 (×21): qty 1

## 2020-09-19 MED ORDER — BUTAMBEN-TETRACAINE-BENZOCAINE 2-2-14 % EX AERO
INHALATION_SPRAY | CUTANEOUS | Status: DC | PRN
  Administered 2020-09-19: 2 via TOPICAL

## 2020-09-19 MED ORDER — PROPOFOL 500 MG/50ML IV EMUL
INTRAVENOUS | Status: DC | PRN
  Administered 2020-09-19: 150 ug/kg/min via INTRAVENOUS

## 2020-09-19 MED ORDER — LIDOCAINE 2% (20 MG/ML) 5 ML SYRINGE
INTRAMUSCULAR | Status: DC | PRN
  Administered 2020-09-19: 40 mg via INTRAVENOUS

## 2020-09-19 MED ORDER — LACTATED RINGERS IV SOLN
INTRAVENOUS | Status: AC | PRN
  Administered 2020-09-19: 1000 mL via INTRAVENOUS

## 2020-09-19 NOTE — TOC Progression Note (Signed)
Transition of Care [facility]) - Progression Note    Patient Details  Name: RISHIT BURKHALTER MRN: [id number] Date of Birth: 1986-04-30  [facility] of [facility]) CM/SW Contact  Golda Acre, RN Phone Number: 09/19/2020, 7:48 AM  Clinical Narrative:     Remains afebrile, T-max 98.8.  Tachycardia overnight noted with heart rates in the 120s to 130s.  Slight increase in leukocytosis from yesterday but overall trend down.  Scrotal ultrasound from yesterday completed in the setting of swelling with no evidence of testicular mass or torsion.  He did have asymmetric left scrotal wall thickening and fluid with increased blood flow likely due to soft tissue infection related to his more widespread process in the left pelvis and thigh.  Will need to continue monitoring to ensure no abscess formation.  Overall drain output with 215 cc in the last 24 h, however, drain #1 with 0 cc over the last 2 days.  Cultures reviewed.  Blood cultures 2/23 remain no growth.  Abscess fluid cultures with abundant MSSA and holding for possible anaerobe.  We'll continue cefazolin to treat his widespread MSSA infection and add Flagyl today for anaerobic coverage given his abscess culture.  Dr. Luciana Axe will assume care tomorrow. plan is time of review-home with hhc   Expected Discharge Plan: Home w Home Health Services Barriers to Discharge: Continued Medical Work up  Expected Discharge Plan and Services Expected Discharge Plan: Home w Home Health Services   Discharge Planning Services: CM Consult   Living arrangements for the past 2 months: Single Family Home                                       Social Determinants of Health (SDOH) Interventions    Readmission Risk Interventions No flowsheet data found.

## 2020-09-19 NOTE — Anesthesia Preprocedure Evaluation (Addendum)
Anesthesia Evaluation  Patient identified by MRN, date of birth, ID band Patient awake    Reviewed: Allergy & Precautions, NPO status , Patient's Chart, lab work & pertinent test results  Airway Mallampati: III  TM Distance: >3 FB     Dental  (+) Dental Advisory Given   Pulmonary asthma , Current Smoker,    breath sounds clear to auscultation       Cardiovascular hypertension,  Rhythm:Regular Rate:Normal     Neuro/Psych  Neuromuscular disease    GI/Hepatic negative GI ROS, (+) Cirrhosis     substance abuse  alcohol use, Hepatitis -  Endo/Other  negative endocrine ROS  Renal/GU Renal diseaseLab Results      Component                Value               Date                      CREATININE               0.41 (L)            09/19/2020                BUN                      14                  09/19/2020                NA                       129 (L)             09/19/2020                K                        4.4                 09/19/2020                CL                       96 (L)              09/19/2020                CO2                      24                  09/19/2020                Musculoskeletal  (+) Arthritis ,   Abdominal   Peds  Hematology  (+) Blood dyscrasia, anemia , Lab Results      Component                Value               Date                      WBC                      14.5 (H)  09/19/2020                HGB                      8.8 (L)             09/19/2020                HCT                      26.6 (L)            09/19/2020                MCV                      98.5                09/19/2020                PLT                      85 (L)              09/19/2020              Anesthesia Other Findings   Reproductive/Obstetrics                            Anesthesia Physical Anesthesia Plan  ASA: IV  Anesthesia Plan: MAC   Post-op Pain  Management:    Induction:   PONV Risk Score and Plan: 0 and Propofol infusion, Treatment may vary due to age or medical condition and Ondansetron  Airway Management Planned: Natural Airway and Nasal Cannula  Additional Equipment:   Intra-op Plan:   Post-operative Plan:   Informed Consent: I have reviewed the patients History and Physical, chart, labs and discussed the procedure including the risks, benefits and alternatives for the proposed anesthesia with the patient or authorized representative who has indicated his/her understanding and acceptance.       Plan Discussed with: CRNA  Anesthesia Plan Comments:        Anesthesia Quick Evaluation

## 2020-09-19 NOTE — Interval H&P Note (Signed)
History and Physical Interval Note:  09/19/2020 7:34 AM  [name]  has presented today for surgery, with the diagnosis of BACTEREMIA.  The various methods of treatment have been discussed with the patient and family. After consideration of risks, benefits and other options for treatment, the patient has consented to  Procedure(s): TRANSESOPHAGEAL ECHOCARDIOGRAM (TEE) (N/A) as a surgical intervention.  The patient's history has been reviewed, patient examined, no change in status, stable for surgery.  I have reviewed the patient's chart and labs.  Questions were answered to the patient's satisfaction.     Dietrich Pates

## 2020-09-19 NOTE — Progress Notes (Addendum)
Physical Therapy Treatment Patient Details Name: [name] MRN: [id number] DOB: 05/08/1986 Today's Date: 09/19/2020    History of Present Illness [name] is a 35 y.o. male with medical history significant of AVN of hip ( originally was scheduled for a THA this coming March2022), HTN, GERD. Recent admission for COVID , acute renal failure and alcoholic hepatitis (08/22/20- 08/29/20). He was ok for a few days after discharge, but he began to feel very tired, weak, left thigh and leg pain and progressed over the last 2 weeks. He reports that the left thigh had become increasingly swollen and tender to touch. He has had a blister rupture on the inner thigh in the groin area. These symptoms increased through 2 days ago when he was no longer ago to walk without great pain. 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    PT Comments    Patient agreeable to bed level exercises this date and reports he recently repositioned for dressing and bed linen change and is having pain. Pt completed Rt LE exercises with some resistance from therapist. Lt LE exercises performed with AAROM for flexion movements and tactile cues needed to facilitate increased activation of extensors. EOS patient educated on importance of EOB/OOB activity, reviewed posterior hip precautions, and instructed on use of theraband for LE and UE exercises. He will benefit from skilled PT interventions to progress mobility and independence. Continue to recommend intense CIR follow up to maximize return of independence with mobility.    Follow Up Recommendations  CIR     Equipment Recommendations  Rolling  walker with 5" wheels;3in1 (PT)    Recommendations for Other Services Rehab consult     Precautions / Restrictions Precautions Precautions: Fall;Posterior Hip Restrictions Weight Bearing Restrictions: No LLE Weight Bearing: Weight bearing as tolerated Other Position/Activity Restrictions: 3 drains in Lt anterior thigh    Mobility  Bed Mobility               General bed mobility comments: pt declined EOB activity despite encouragement and education on importance of mobility. agreeable to bed exercises.    Transfers                    Ambulation/Gait                 Stairs             Wheelchair Mobility    Modified Rankin (Stroke Patients Only)       Balance                                            Cognition Arousal/Alertness: Awake/alert Behavior During Therapy: WFL for tasks assessed/performed Overall Cognitive Status: Within Functional Limits for tasks assessed                                 General Comments: pt appeared down when he recived a phone call during session informing him he would have surgery again tomorrow.      Exercises General Exercises -  Lower Extremity Ankle Circles/Pumps: AROM;Both;15 reps;Supine Quad Sets: AROM;Both;10 reps;Supine Short Arc Quad: Right;10 reps;AROM;Supine Heel Slides: AROM;Right;AAROM;Left;10 reps (resistance on Rt LE for flex/ext; AAROM on Lt LE) Hip ABduction/ADduction: AROM;Right;10 reps;Supine Other Exercises Other Exercises: educated on use of theraband for LE exercises on Rt LE and UE exercises.    General Comments        Pertinent Vitals/Pain Pain Assessment: Faces Faces Pain Scale: Hurts little more Pain Location: Lt hip and scrotal edema Pain Descriptors / Indicators: Discomfort;Grimacing;Operative site guarding Pain Intervention(s): Limited activity within patient's tolerance;Monitored during session;Patient requesting pain meds-RN notified     Home Living                      Prior Function            PT Goals (current goals can now be found in the care plan section) Acute Rehab PT Goals Patient Stated Goal: to be able to walk again and more independent PT Goal Formulation: With patient/family Time For Goal Achievement: 09/30/20 Potential to Achieve Goals: Good Progress towards PT goals: Progressing toward goals    Frequency    Min 4X/week      PT Plan Current plan remains appropriate    Co-evaluation              AM-PAC PT "6 Clicks" Mobility   Outcome Measure  Help needed turning from your back to your side while in a flat bed without using bedrails?: A Lot Help needed moving from lying on your back to sitting on the side of a flat bed without using bedrails?: A Lot Help needed moving to and from a bed to a chair (including a wheelchair)?: A Lot Help needed standing up from a chair using your arms (e.g., wheelchair or bedside chair)?: Total Help needed to walk in hospital room?: Total Help needed climbing 3-5 steps with a railing? : Total 6 Click Score: 9    End of Session   Activity Tolerance: Patient tolerated treatment well;Patient limited by pain Patient left: in bed;with call bell/phone within reach;with family/visitor present;with nursing/sitter in room Nurse Communication: Mobility status;Patient requests pain meds PT Visit Diagnosis: Other abnormalities of gait and mobility (R26.89);Muscle weakness (generalized) (M62.81);Pain Pain - Right/Left: Left Pain - part of body: Hip     Time: 1720-1758 PT Time Calculation (min) (ACUTE ONLY): 38 min  Charges:  $Therapeutic Exercise: 23-37 mins                     Wynn Maudlin, DPT Acute Rehabilitation Services Office [phone] Pager [phone]     [name] 09/19/2020, 6:55 PM

## 2020-09-19 NOTE — Anesthesia Procedure Notes (Addendum)
Procedure Name: MAC Date/Time: 09/19/2020 9:01 AM Performed by: Rande Brunt, CRNA Pre-anesthesia Checklist: Patient identified, Emergency Drugs available, Suction available and Patient being monitored Patient Re-evaluated:Patient Re-evaluated prior to induction Oxygen Delivery Method: Nasal cannula Preoxygenation: Pre-oxygenation with 100% oxygen Induction Type: IV induction Placement Confirmation: positive ETCO2 Dental Injury: Teeth and Oropharynx as per pre-operative assessment

## 2020-09-19 NOTE — Transfer of Care (Signed)
Immediate Anesthesia Transfer of Care Note  Patient: [name]  Procedure(s) Performed: TRANSESOPHAGEAL ECHOCARDIOGRAM (TEE) (N/A ) BUBBLE STUDY  Patient Location: Endoscopy Unit  Anesthesia Type:MAC  Level of Consciousness: awake, drowsy and patient cooperative  Airway & Oxygen Therapy: Patient Spontanous Breathing  Post-op Assessment: Report given to RN, Post -op Vital signs reviewed and stable and Patient moving all extremities  Post vital signs: Reviewed and stable  Last Vitals:  Vitals Value Taken Time  BP 112/57 09/19/20 0926  Temp    Pulse 103 09/19/20 0926  Resp 20 09/19/20 0926  SpO2 96 % 09/19/20 0926  Vitals shown include unvalidated device data.  Last Pain:  Vitals:   09/19/20 0843  TempSrc: Oral  PainSc: 5       Patients Stated Pain Goal: 3 (93/24/19 9144)  Complications: No complications documented.

## 2020-09-19 NOTE — Progress Notes (Signed)
Subjective: 4 Days Post-Op s/p Procedure(s): TOTAL HIP ARTHROPLASTY WITH CEMENT INTERPOSITION SPACING  Patient states left hip pain is moderate at rest, but severe with any movement. Denies chest pain, SOB, Calf pain. No nausea/vomiting. Urinating has been difficult and painful over the last 24 hours.    Objective:  PE: VITALS:   Vitals:   09/19/20 0400 09/19/20 0500 09/19/20 0600 09/19/20 0700  BP: 122/60 (!) 120/59 120/62 121/65  Pulse: (!) 111 (!) 113 (!) 110 (!) 103  Resp: 18 18 19 19   Temp: 98 F (36.7 C)     TempSrc: Oral     SpO2: 96% 95% 97% 98%  Weight:  91.8 kg    Height:         Sensation intact distally Intact pulses distally Dorsiflexion/Plantar flexion intact  Surgical dressing changed this morning, no drainage noted on bandages.   LABS  Results for orders placed or performed during the hospital encounter of 09/13/20 (from the past 24 hour(s))  Lactic acid, plasma     Status: Abnormal   Collection Time: 09/18/20  8:43 AM  Result Value Ref Range   Lactic Acid, Venous 2.8 (HH) 0.5 - 1.9 mmol/L  Procalcitonin - Baseline     Status: None   Collection Time: 09/18/20  8:43 AM  Result Value Ref Range   Procalcitonin 7.78 ng/mL  Lactic acid, plasma     Status: Abnormal   Collection Time: 09/18/20 11:05 AM  Result Value Ref Range   Lactic Acid, Venous 3.1 (HH) 0.5 - 1.9 mmol/L  Procalcitonin     Status: None   Collection Time: 09/19/20  3:05 AM  Result Value Ref Range   Procalcitonin 4.14 ng/mL  Magnesium     Status: None   Collection Time: 09/19/20  3:05 AM  Result Value Ref Range   Magnesium 1.9 1.7 - 2.4 mg/dL  Phosphorus     Status: None   Collection Time: 09/19/20  3:05 AM  Result Value Ref Range   Phosphorus 3.4 2.5 - 4.6 mg/dL  CBC with Differential/Platelet     Status: Abnormal   Collection Time: 09/19/20  3:05 AM  Result Value Ref Range   WBC 14.5 (H) 4.0 - 10.5 K/uL   RBC 2.70 (L) 4.22 - 5.81 MIL/uL   Hemoglobin 8.8 (L) 13.0 - 17.0  g/dL   HCT 09/21/20 (L) 28.4 - 13.2 %   MCV 98.5 80.0 - 100.0 fL   MCH 32.6 26.0 - 34.0 pg   MCHC 33.1 30.0 - 36.0 g/dL   RDW 44.0 (H) 10.2 - 72.5 %   Platelets 85 (L) 150 - 400 K/uL   nRBC 0.4 (H) 0.0 - 0.2 %   Neutrophils Relative % 82 %   Neutro Abs 11.8 (H) 1.7 - 7.7 K/uL   Lymphocytes Relative 9 %   Lymphs Abs 1.4 0.7 - 4.0 K/uL   Monocytes Relative 3 %   Monocytes Absolute 0.4 0.1 - 1.0 K/uL   Eosinophils Relative 1 %   Eosinophils Absolute 0.1 0.0 - 0.5 K/uL   Basophils Relative 0 %   Basophils Absolute 0.0 0.0 - 0.1 K/uL   WBC Morphology TOXIC GRANULATION    Immature Granulocytes 5 %   Abs Immature Granulocytes 0.79 (H) 0.00 - 0.07 K/uL   Polychromasia PRESENT   Comprehensive metabolic panel     Status: Abnormal   Collection Time: 09/19/20  3:05 AM  Result Value Ref Range   Sodium 129 (L) 135 - 145 mmol/L  Potassium 4.4 3.5 - 5.1 mmol/L   Chloride 96 (L) 98 - 111 mmol/L   CO2 24 22 - 32 mmol/L   Glucose, Bld 100 (H) 70 - 99 mg/dL   BUN 14 6 - 20 mg/dL   Creatinine, Ser 1.610.41 (L) 0.61 - 1.24 mg/dL   Calcium 7.4 (L) 8.9 - 10.3 mg/dL   Total Protein 5.2 (L) 6.5 - 8.1 g/dL   Albumin 2.0 (L) 3.5 - 5.0 g/dL   AST 39 15 - 41 U/L   ALT 16 0 - 44 U/L   Alkaline Phosphatase 244 (H) 38 - 126 U/L   Total Bilirubin 1.7 (H) 0.3 - 1.2 mg/dL   GFR, Estimated >09>60 >60>60 mL/min   Anion gap 9 5 - 15  Protime-INR     Status: Abnormal   Collection Time: 09/19/20  3:05 AM  Result Value Ref Range   Prothrombin Time 17.3 (H) 11.4 - 15.2 seconds   INR 1.5 (H) 0.8 - 1.2    CT ABDOMEN PELVIS W CONTRAST  Result Date: 09/18/2020 CLINICAL DATA:  f ollow up drain placement in Abd/pelvis and left femur EXAM: CT ABDOMEN AND PELVIS WITH CONTRAST TECHNIQUE: Multidetector CT imaging of the abdomen and pelvis was performed using the standard protocol following bolus administration of intravenous contrast. CONTRAST:  100mL OMNIPAQUE IOHEXOL 300 MG/ML  SOLN COMPARISON:  CT abdomen pelvis 09/13/2020  FINDINGS: Lower chest: Bilateral lower lobe subsegmental atelectasis. 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 Slightly heterogeneous appearance of the hepatic parenchyma. No definite focal hepatic lesion; however, limited evaluation on this single-phase venous contrast study. No CT findings of calcified gallstones. No gallbladder wall thickening. No biliary ductal dilatation. Pancreas: No focal lesion. Normal pancreatic contour. No surrounding inflammatory changes. No main pancreatic ductal dilatation. Spleen: Normal in size without focal abnormality. Adrenals/Urinary Tract: No adrenal nodule bilaterally. Bilateral kidneys enhance symmetrically. No hydronephrosis. No hydroureter. Foci of gas within the urinary bladder lumen likely related to recent instrumentation. Otherwise the urinary bladder is unremarkable. Stomach/Bowel: Stomach is within normal limits. No evidence of bowel wall thickening or dilatation. The appendix not definitely identified. Vascular/Lymphatic: The portal, splenic, superior mesenteric veins are patent. No abdominal aorta or iliac aneurysm. Multiple prominent but nonenlarged asymmetric left inguinal lymph nodes. Similar finding of the left external iliac lymph nodes. No abdominal, pelvic, or inguinal lymphadenopathy. Reproductive: Prostate is unremarkable. Scrotal subcutaneus soft tissue edema again noted, likely slightly worsened. Other: Interval increase in small to moderate volume simple free fluid ascites. No free intraperitoneal gas. Musculoskeletal: Interval placement of a left inguinal approach surgical drain with pigtail terminating within a fluid collection associated with the left psoas muscle. The fluid collections are noted to be septated with peripheral enhancement. There has been interval decrease in size of the iliopsoas fluid collections with the largest component of the psoas muscle measuring up to 4.3 x 1.3 cm (from 6.5 x 5.6 cm and  inferiorly the largest iliacus portion that appears to be septated measuring approximately 5.8 x 4.4 cm (from 8.3 x 6.7 cm). Redemonstration of proximal left thigh extensive subcutaneus soft tissue edema as well as several foci of emphysema. Associated multiple fluid collections that are peripherally enhancing. Status post interval surgical drain placement that is partially visualized. Please see separately dictated CT left femur 09/13/2020 for further details. Persistent subcutaneus soft tissue edema that is increased compared to prior. Interval placement of skin staples overlying the left hip. Status post interval partially visualized left total hip arthroplasty. IMPRESSION:  1. Interval decrease in size of multiple multiseptated iliopsoas abscesses status post interval surgical drain placement with pigtail terminating within a fluid collection associated with the left psoas muscle. 2. Redemonstration of proximal left thigh extensive subcutaneus soft tissue edema as well as several foci of emphysema. Associated left lower extremity multiple abscesses status post partially visualized interval drain catheter. Status post interval partially visualized left total hip arthroplasty. Please see separately dictated CT left femur 09/18/2020 for further details. 3. Foci of gas within the urinary bladder lumen. Correlate with recent instrumentation. 4. Interval increase in small to moderate volume simple free fluid ascites. 5. Interval increase in diffuse subcutaneus soft tissue edema. 6. Cirrhotic morphology of the liver. Markedly limited evaluation for focal hepatic lesion on this single phase venous contrast study. Recommend non-emergent MRI hepatic liver protocol for further evaluation. 7. Please see separately dictated CT left femur 09/18/20 further details. Electronically Signed   By: Tish Frederickson M.D.   On: 09/18/2020 17:14   CT FEMUR LEFT W CONTRAST  Result Date: 09/18/2020 CLINICAL DATA:  Follow up left thigh  abscesses following percutaneous drain placement 4 days ago. EXAM: CT OF THE LOWER LEFT EXTREMITY WITH CONTRAST TECHNIQUE: Multidetector CT imaging of the left thigh was performed according to the standard protocol following intravenous contrast administration. CONTRAST:  OMNIPAQUE IOHEXOL 300 MG/ML  SOLN COMPARISON:  Pelvic CT 09/13/2020. Images during drainage procedure 09/14/2020. FINDINGS: Bones/Joint/Cartilage Interval left total hip arthroplasty. The hardware appears well positioned. No evidence of acute fracture or dislocation. There is no bone destruction. Stable mild degenerative changes of the left sacroiliac joint. No large hip joint effusion. Ligaments Suboptimally assessed by CT. Muscles and Tendons Percutaneous drain within the left psoas muscle is unchanged in position. There is no recurrent fluid collection in the psoas muscle, although there is a residual peripherally enhancing iliacus fluid collection measuring 2.4 cm transverse on image 52/5. Peripherally enhancing fluid tracks inferiorly along the iliopsoas tendon into the anterior aspect of the left thigh. There are 2 percutaneous drains within the proximal to mid left thigh. The more lateral drain is situated within a complex fluid collection within the vastus lateralis muscle which measures up to 5.3 x 4.8 cm at the level of the drain. This collection is complex with peripheral enhancement and multiple septations. It extends into the distal thigh, at least 21 cm in length on sagittal image 165/9. Some air within the collection is attributed to the drain. The more posteromedial drain is located within a complex fluid collection involving the proximal sartorius muscle. At the level of the drain, this measures approximately 11.0 x 4.1 cm on image 203/5. This fluid collection is also complex with peripheral enhancement. It may communicate with a separate peripherally enhancing fluid collection involving the right gracilis muscle. These  medial collections are also quite extensive in length, extending beyond the knee, at least 40 cm in overall length (image 125/9). Based on the images obtained through the left thigh after catheter placement on 09/14/2020, the proximal component of the anteromedial collection has mildly enlarged. Soft tissues There is generalized subcutaneous edema throughout the left thigh. No unexpected foreign body. Pelvic findings are dictated separately. IMPRESSION: 1. Interval placement of left pelvic and two left thigh drains for multiple abscesses. The pelvic components of the fluid collections have mildly improved, but there are multiple persistent large peripherally enhancing complex fluid collections within the left thigh which are incompletely drained. These are quite extensive in length, extending into the distal thigh. The  proximal component of the anteromedial collection has slightly enlarged. 2. No evidence of osteomyelitis or large hip joint effusion. 3. Interval left total hip arthroplasty. The hardware appears well positioned. Electronically Signed   By: Carey Bullocks M.D.   On: 09/18/2020 17:38   US SCROTUM W/DOPPLER  Result Date: 09/17/2020 CLINICAL DATA:  Left scrotal pain and swelling for several days. EXAM: SCROTAL ULTRASOUND DOPPLER ULTRASOUND OF THE TESTICLES TECHNIQUE: Complete ultrasound examination of the testicles, epididymis, and other scrotal structures was performed. Color and spectral Doppler ultrasound were also utilized to evaluate blood flow to the testicles. COMPARISON:  CT on 09/13/2020 FINDINGS: Right testicle Measurements: 3.2 x 1.8 x 2.4 cm. No mass or microlithiasis visualized. Left testicle Measurements: 3.4 x 2.1 x 2.0 cm. No mass or microlithiasis visualized. Right epididymis:  Normal in size and appearance. Left epididymis:  Normal in size and appearance. Hydrocele:  None visualized. Varicocele:  None visualized. Pulsed Doppler interrogation of both testes demonstrates normal low  resistance arterial and venous waveforms bilaterally. Marked asymmetric left scrotal wall thickening and fluid is seen, with increased blood flow on color Doppler ultrasound. This appears to be part of the much more widespread process throughout the left pelvic and thigh soft tissues seen, as demonstrated on prior CT. IMPRESSION: No evidence of testicular mass or torsion. Marked asymmetric left scrotal wall thickening and fluid, with increased blood flow. This is likely due to soft tissue infection, and part of the much more widespread process in the left pelvis and thigh demonstrated on prior CT. Electronically Signed   By: Danae Orleans M.D.   On: 09/17/2020 18:47    Assessment/Plan: Principal Problem:   MSSA bacteremia Active Problems:   Alcohol dependence (HCC)   Alcoholic hepatitis   Sepsis (HCC)   Septic arthritis (HCC)   Iliopsoas abscess on left (HCC)  Left hip septic arthritis with iliopsoas and left gluteal abscesses 4 Days Post-Op s/p Procedure(s): TOTAL HIP ARTHROPLASTY WITH CEMENT INTERPOSITION SPACING Weightbearing: WBAT LLE Insicional and dressing care: twice a day dressing changes to incision with gauze and abds VTE prophylaxis: will default to primary given coagulopathy from liver disease and thrombocytopenia, SCD's Pain control: oxycodone - CT abdomen pelvis and left femur shows Interval placement of left pelvic and two left thigh drains for multiple abscesses. The pelvic components of the fluid collections have mildly improved, but there are multiple persistent large peripherally enhancing complex fluid collections within the left thigh which are incompletely drained. These are quite extensive in length, extending into the distal thigh. - After review of CT scan, think that best course of action would be an open irrigation and debridement of the medial and lateral thigh to express these fluid collections. Will speak with patient about this. NPO after midnight. - patient being  transferred to [facility] for TEE today    Contact information:   Weekdays 8-5 [address], [place] [phone] A fter hours and holidays please check [email or web address] for group call information for [facility]  Armida Sans 09/19/2020, 8:17 AM

## 2020-09-19 NOTE — Progress Notes (Signed)
Referring Physician(s): Marguerita Merles Latif ([facility])/ Howell Rucks  Supervising Physician: Mir, Mauri Reading  Patient Status:  [name] - In-pt  Chief Complaint: "feels not good"  Subjective:  History of left hip septic arthritis complicated by development of left retroperitoneal and left thigh fluid collections concerning for abscesses s/p drain placement x3 (left retroperitoneal, medial left thigh, lateral left thigh) in IR 09/14/2020. S/p Left total hip arthroplasty with antibiotic impregnated cement interposition spacer and left hip excisional debridement, skin, subcutaneous tissue, muscle, bone, deep abscess on 09/15/20 by Ortho.   Patient awake and alert laying in bed. Feeling "not good" today, stable since admission.  Allergies: Cashew nut (anacardium occidentale) skin test and Cashew nut oil  Medications: Prior to Admission medications   Medication Sig Start Date End Date Taking? Authorizing Provider  carvedilol (COREG) 3.125 MG tablet Take 3.125 mg by mouth 2 (two) times daily. 07/28/20  Yes [provider]  folic acid (FOLVITE) 1 MG tablet Take 1 tablet (1 mg total) by mouth daily. 08/30/20  Yes Pokhrel, Laxman, MD  guaiFENesin-dextromethorphan (ROBITUSSIN DM) 100-10 MG/5ML syrup Take 5 mLs by mouth every 4 (four) hours as needed for cough (chest congestion). Patient taking differently: Take 10 mLs by mouth every 4 (four) hours as needed for cough (chest congestion). 08/29/20  Yes Pokhrel, Laxman, MD  lactulose (CHRONULAC) 10 GM/15ML solution Take 15 mLs (10 g total) by mouth 2 (two) times daily. 08/29/20  Yes Pokhrel, Laxman, MD  magnesium oxide (MAG-OX) 400 MG tablet Take 1 tablet by mouth daily. 07/28/20  Yes [provider]  pantoprazole (PROTONIX) 40 MG tablet Take 1 tablet (40 mg total) by mouth 2 (two) times daily before a meal for 28 days, THEN 1 tablet (40 mg total) daily for 28 days. 08/29/20 10/24/20 Yes Pokhrel, Laxman, MD  prednisoLONE (ORAPRED) 15 MG/5ML solution Take  1.6-13.3 mLs by mouth See admin instructions. Take 13.24ml by mouth daily for 28 days, 50ml for 4 days, 6.70ml daily for 4 days, 3.3 daily for 4 days, then 1.30ml daily for 3 days. 08/29/20  Yes [provider]  sertraline (ZOLOFT) 25 MG tablet Take 25 mg by mouth at bedtime. 08/20/20  Yes [provider]  thiamine 100 MG tablet Take 1 tablet (100 mg total) by mouth daily. 08/30/20  Yes Pokhrel, Laxman, MD  buPROPion (WELLBUTRIN XL) 300 MG 24 hr tablet Take 1 tablet (300 mg total) by mouth daily. Patient not taking: No sig reported 10/16/13   Rachael Fee, MD  carbamazepine (TEGRETOL XR) 200 MG 12 hr tablet Take 1 tablet (200 mg total) by mouth at bedtime. Patient not taking: No sig reported 10/16/13   Rachael Fee, MD  hydrOXYzine (ATARAX/VISTARIL) 25 MG tablet Take 1 tablet (25 mg total) by mouth every 6 (six) hours as needed for anxiety (or CIWA score </= 10). Patient not taking: No sig reported 10/16/13   Rachael Fee, MD  lisinopril-hydrochlorothiazide (ZESTORETIC) 20-25 MG tablet Take 1 tablet by mouth daily. Patient not taking: No sig reported 08/31/20   Pokhrel, Rebekah [place], MD  oxyCODONE (ROXICODONE) 5 MG immediate release tablet Take 1 tablet (5 mg total) by mouth every 8 (eight) hours as needed. Patient not taking: No sig reported 08/29/20 08/29/21  Pokhrel, Rebekah [place], MD  prednisoLONE 5 MG TABS tablet Take 8 tablets (40 mg total) by mouth daily for 28 days, THEN 6 tablets (30 mg total) daily for 4 days, THEN 4 tablets (20 mg total) daily for 4 days, THEN 2 tablets (  10 mg total) daily for 4 days, THEN 1 tablet (5 mg total) every 3 (three) days for 1 day. Patient not taking: Reported on 09/13/2020 08/29/20 10/09/20  Joycelyn Das, MD  traMADol (ULTRAM) 50 MG tablet Take 1 tablet (50 mg total) by mouth every 6 (six) hours as needed. Patient not taking: Reported on 09/13/2020 08/29/20   Joycelyn Das, MD     Vital Signs: BP 124/66 (BP Location: Right Arm)   Pulse (!) 103   Temp 99 F (37.2  C) (Oral)   Resp 18   Ht  (1.778 m)   Wt 215 lb 9.8 oz (97.8 kg)   SpO2 99%   BMI 30.94 kg/m   Physical Exam Vitals and nursing note reviewed.  Constitutional:      General: He is not in acute distress. Pulmonary:     Effort: Pulmonary effort is normal. No respiratory distress.  Musculoskeletal:     Comments: Drain #1 (exit site medial left hip): no output. Flushes well.  Drain #2 (exit site lateral left hip) : 10 cc serosanguinous output in gravity bag. Flushes easily.  Drain #3 (exit site left hip/groin): 100 cc serosanguinous output in gravity bag. Flushes easily.    Skin:    General: Skin is warm and dry.  Neurological:     Mental Status: He is alert and oriented to person, place, and time.     Imaging: CT ABDOMEN PELVIS W CONTRAST  Result Date: 09/18/2020 CLINICAL DATA:  f ollow up drain placement in Abd/pelvis and left femur EXAM: CT ABDOMEN AND PELVIS WITH CONTRAST TECHNIQUE: Multidetector CT imaging of the abdomen and pelvis was performed using the standard protocol following bolus administration of intravenous contrast. CONTRAST:  OMNIPAQUE IOHEXOL 300 MG/ML  SOLN COMPARISON:  CT abdomen pelvis 09/13/2020 FINDINGS: Lower chest: Bilateral lower lobe subsegmental atelectasis. 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 Slightly heterogeneous appearance of the hepatic parenchyma. No definite focal hepatic lesion; however, limited evaluation on this single-phase venous contrast study. No CT findings of calcified gallstones. No gallbladder wall thickening. No biliary ductal dilatation. Pancreas: No focal lesion. Normal pancreatic contour. No surrounding inflammatory changes. No main pancreatic ductal dilatation. Spleen: Normal in size without focal abnormality. Adrenals/Urinary Tract: No adrenal nodule bilaterally. Bilateral kidneys enhance symmetrically. No hydronephrosis. No hydroureter. Foci of gas within the urinary bladder lumen  likely related to recent instrumentation. Otherwise the urinary bladder is unremarkable. Stomach/Bowel: Stomach is within normal limits. No evidence of bowel wall thickening or dilatation. The appendix not definitely identified. Vascular/Lymphatic: The portal, splenic, superior mesenteric veins are patent. No abdominal aorta or iliac aneurysm. Multiple prominent but nonenlarged asymmetric left inguinal lymph nodes. Similar finding of the left external iliac lymph nodes. No abdominal, pelvic, or inguinal lymphadenopathy. Reproductive: Prostate is unremarkable. Scrotal subcutaneus soft tissue edema again noted, likely slightly worsened. Other: Interval increase in small to moderate volume simple free fluid ascites. No free intraperitoneal gas. Musculoskeletal: Interval placement of a left inguinal approach surgical drain with pigtail terminating within a fluid collection associated with the left psoas muscle. The fluid collections are noted to be septated with peripheral enhancement. There has been interval decrease in size of the iliopsoas fluid collections with the largest component of the psoas muscle measuring up to 4.3 x 1.3 cm (from 6.5 x 5.6 cm and inferiorly the largest iliacus portion that appears to be septated measuring approximately 5.8 x 4.4 cm (from 8.3 x 6.7 cm). Redemonstration of proximal left thigh extensive  subcutaneus soft tissue edema as well as several foci of emphysema. Associated multiple fluid collections that are peripherally enhancing. Status post interval surgical drain placement that is partially visualized. Please see separately dictated CT left femur 09/13/2020 for further details. Persistent subcutaneus soft tissue edema that is increased compared to prior. Interval placement of skin staples overlying the left hip. Status post interval partially visualized left total hip arthroplasty. IMPRESSION: 1. Interval decrease in size of multiple multiseptated iliopsoas abscesses status post  interval surgical drain placement with pigtail terminating within a fluid collection associated with the left psoas muscle. 2. Redemonstration of proximal left thigh extensive subcutaneus soft tissue edema as well as several foci of emphysema. Associated left lower extremity multiple abscesses status post partially visualized interval drain catheter. Status post interval partially visualized left total hip arthroplasty. Please see separately dictated CT left femur 09/18/2020 for further details. 3. Foci of gas within the urinary bladder lumen. Correlate with recent instrumentation. 4. Interval increase in small to moderate volume simple free fluid ascites. 5. Interval increase in diffuse subcutaneus soft tissue edema. 6. Cirrhotic morphology of the liver. Markedly limited evaluation for focal hepatic lesion on this single phase venous contrast study. Recommend non-emergent MRI hepatic liver protocol for further evaluation. 7. Please see separately dictated CT left femur 09/18/20 further details. Electronically Signed   By: Tish FredericksonMorgane  Naveau M.D.   On: 09/18/2020 17:14   CT FEMUR LEFT W CONTRAST  Result Date: 09/18/2020 CLINICAL DATA:  Follow up left thigh abscesses following percutaneous drain placement 4 days ago. EXAM: CT OF THE LOWER LEFT EXTREMITY WITH CONTRAST TECHNIQUE: Multidetector CT imaging of the left thigh was performed according to the standard protocol following intravenous contrast administration. CONTRAST:  100mL OMNIPAQUE IOHEXOL 300 MG/ML  SOLN COMPARISON:  Pelvic CT 09/13/2020. Images during drainage procedure 09/14/2020. FINDINGS: Bones/Joint/Cartilage Interval left total hip arthroplasty. The hardware appears well positioned. No evidence of acute fracture or dislocation. There is no bone destruction. Stable mild degenerative changes of the left sacroiliac joint. No large hip joint effusion. Ligaments Suboptimally assessed by CT. Muscles and Tendons Percutaneous drain within the left psoas  muscle is unchanged in position. There is no recurrent fluid collection in the psoas muscle, although there is a residual peripherally enhancing iliacus fluid collection measuring 2.4 cm transverse on image 52/5. Peripherally enhancing fluid tracks inferiorly along the iliopsoas tendon into the anterior aspect of the left thigh. There are 2 percutaneous drains within the proximal to mid left thigh. The more lateral drain is situated within a complex fluid collection within the vastus lateralis muscle which measures up to 5.3 x 4.8 cm at the level of the drain. This collection is complex with peripheral enhancement and multiple septations. It extends into the distal thigh, at least 21 cm in length on sagittal image 165/9. Some air within the collection is attributed to the drain. The more posteromedial drain is located within a complex fluid collection involving the proximal sartorius muscle. At the level of the drain, this measures approximately 11.0 x 4.1 cm on image 203/5. This fluid collection is also complex with peripheral enhancement. It may communicate with a separate peripherally enhancing fluid collection involving the right gracilis muscle. These medial collections are also quite extensive in length, extending beyond the knee, at least 40 cm in overall length (image 125/9). Based on the images obtained through the left thigh after catheter placement on 09/14/2020, the proximal component of the anteromedial collection has mildly enlarged. Soft tissues There is generalized subcutaneous  edema throughout the left thigh. No unexpected foreign body. Pelvic findings are dictated separately. IMPRESSION: 1. Interval placement of left pelvic and two left thigh drains for multiple abscesses. The pelvic components of the fluid collections have mildly improved, but there are multiple persistent large peripherally enhancing complex fluid collections within the left thigh which are incompletely drained. These are quite  extensive in length, extending into the distal thigh. The proximal component of the anteromedial collection has slightly enlarged. 2. No evidence of osteomyelitis or large hip joint effusion. 3. Interval left total hip arthroplasty. The hardware appears well positioned. Electronically Signed   By: Carey Bullocks M.D.   On: 09/18/2020 17:38   DG Pelvis Portable  Result Date: 09/15/2020 CLINICAL DATA:  Status post total left hip arthroplasty. EXAM: PORTABLE PELVIS 1-2 VIEWS COMPARISON:  None. FINDINGS: A left hip replacement is seen without evidence of surrounding lucency. There is no evidence of pelvic fracture or diastasis. No pelvic bone lesions are seen. Radiopaque surgical drains are seen. A mild-to-moderate amount of soft tissue air is seen along the lateral aspect of the left hip. IMPRESSION: Status post left hip replacement. Electronically Signed   By: Aram Candela M.D.   On: 09/15/2020 20:20   DG Chest Port 1 View  Result Date: 09/15/2020 CLINICAL DATA:  Shock. EXAM: PORTABLE CHEST 1 VIEW COMPARISON:  September 13, 2020 FINDINGS: Mildly decreased lung volumes are seen which is likely secondary to the degree of patient inspiration. Very mild atelectasis and/or early infiltrate is noted within the right lung base. There is no evidence of a pleural effusion or pneumothorax. The heart size and mediastinal contours are within normal limits. The visualized skeletal structures are unremarkable. IMPRESSION: Very mild right basilar atelectasis and/or early infiltrate. Electronically Signed   By: Aram Candela M.D.   On: 09/15/2020 20:20   US SCROTUM W/DOPPLER  Result Date: 09/17/2020 CLINICAL DATA:  Left scrotal pain and swelling for several days. EXAM: SCROTAL ULTRASOUND DOPPLER ULTRASOUND OF THE TESTICLES TECHNIQUE: Complete ultrasound examination of the testicles, epididymis, and other scrotal structures was performed. Color and spectral Doppler ultrasound were also utilized to evaluate blood  flow to the testicles. COMPARISON:  CT on 09/13/2020 FINDINGS: Right testicle Measurements: 3.2 x 1.8 x 2.4 cm. No mass or microlithiasis visualized. Left testicle Measurements: 3.4 x 2.1 x 2.0 cm. No mass or microlithiasis visualized. Right epididymis:  Normal in size and appearance. Left epididymis:  Normal in size and appearance. Hydrocele:  None visualized. Varicocele:  None visualized. Pulsed Doppler interrogation of both testes demonstrates normal low resistance arterial and venous waveforms bilaterally. Marked asymmetric left scrotal wall thickening and fluid is seen, with increased blood flow on color Doppler ultrasound. This appears to be part of the much more widespread process throughout the left pelvic and thigh soft tissues seen, as demonstrated on prior CT. IMPRESSION: No evidence of testicular mass or torsion. Marked asymmetric left scrotal wall thickening and fluid, with increased blood flow. This is likely due to soft tissue infection, and part of the much more widespread process in the left pelvis and thigh demonstrated on prior CT. Electronically Signed   By: Danae Orleans M.D.   On: 09/17/2020 18:47   DG Hip Port Unilat With Pelvis 1V Left  Result Date: 09/15/2020 CLINICAL DATA:  Status post left hip arthroplasty. EXAM: DG HIP (WITH OR WITHOUT PELVIS) 1V PORT LEFT COMPARISON:  None. FINDINGS: A left hip replacement is seen without evidence of surrounding lucency. There is no evidence of  acute hip fracture or dislocation. There is no evidence of arthropathy or other focal bone abnormality. Multiple radiopaque surgical drains are seen. Radiopaque skin staples are also noted. IMPRESSION: Left hip replacement without evidence of hardware complication or fracture. Electronically Signed   By: Aram Candela M.D.   On: 09/15/2020 20:21    Labs:  CBC: Recent Labs    09/16/20 0238 09/16/20 1419 09/17/20 0511 09/18/20 0303 09/19/20 0305  WBC 31.6*  --  12.1* 15.1* 14.5*  HGB 6.4* 7.9*  7.5* 9.8* 8.8*  HCT 19.5* 23.3* 22.5* 30.2* 26.6*  PLT 84*  --  72* 86* 85*    COAGS: Recent Labs    08/22/20 1817 08/25/20 0349 09/13/20 1045 09/14/20 0525 09/15/20 2300 09/19/20 0305  INR 1.4*   < > 1.4* 1.5* 2.2* 1.5*  APTT 42*  --   --   --   --   --    < > = values in this interval not displayed.    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  Left medial thigh drain (#1) stable with no output in gravity bag (0 output from drain in past 24 hours per chart). Left lateral thigh drain (#2) stable with approximately 10cc serosanginous output in gravity bag (additional 20cc output from drain in past 24 hours per chart). Left retroperitoneal drain (#3) stable with approximately 100 cc serosanginous output in gravity bag (additional 350 cc output from drain in past 24 hours per chart).  Afebrile, WBC elevated but trending down.   Continue current drain management- continue Qshift flushes/monitor of output. Obtain repeat CT when output <10 cc/day (assess for  possible removal). Further plans per [facility]/orthopedic surgery/ID- appreciate and agree with management. IR to follow.   Electronically Signed: , PA-C 09/19/2020, 1:39 PM   I spent a total of 15 Minutes at the the patient's bedside AND on the patient's hospital floor or unit, greater than 50% of which was counseling/coordinating care for left retroperitoneal and left thigh fluid collections s/p drain placement x3.

## 2020-09-19 NOTE — Progress Notes (Signed)
[facility]   Reason for visit: Follow up on disseminated MSSA infection  Interval History: TEE done and no vegetation noted; repeat blood cultures from 2/23 remain ngtd; no associated rash or diarrhea.  WBC stable at 14.5, no fever.   Day 7 cefazolin Day 2 metronidazole   Physical Exam: Constitutional:  Vitals:   09/19/20 1000 09/19/20 1100  BP: 120/60 124/66  Pulse: 98 (!) 103  Resp: 15 18  Temp:    SpO2: 96% 99%   patient appears in NAD Respiratory: Normal respiratory effort; CTA B Cardiovascular: RRR GU: significant scrotal swelling MS: drain in place  Review of Systems: Constitutional: negative for fevers and chills Gastrointestinal: negative for nausea and diarrhea Integument/breast: negative for rash  Lab Results  Component Value Date   WBC 14.5 (H) 09/19/2020   HGB 8.8 (L) 09/19/2020   HCT 26.6 (L) 09/19/2020   MCV 98.5 09/19/2020   PLT 85 (L) 09/19/2020    Lab Results  Component Value Date   CREATININE 0.41 (L) 09/19/2020   BUN 14 09/19/2020   NA 129 (L) 09/19/2020   K 4.4 09/19/2020   CL 96 (L) 09/19/2020   CO2 24 09/19/2020    Lab Results  Component Value Date   ALT 16 09/19/2020   AST 39 09/19/2020   ALKPHOS 244 (H) 09/19/2020     Microbiology: Recent Results (from the past 240 hour(s))  Culture, blood (routine x 2)     Status: Abnormal   Collection Time: 09/13/20 10:45 AM   Specimen: BLOOD  Result Value Ref Range Status   Specimen Description   Final    BLOOD RIGHT ANTECUBITAL Performed at [facility], [address] [place], [place] [address]    Special Requests   Final    BOTTLES DRAWN AEROBIC AND ANAEROBIC Blood Culture adequate volume Performed at [facility], [address] [place], [place] [address]    Culture  Setup Time   Final    IN BOTH AEROBIC AND ANAEROBIC BOTTLES GRAM POSITIVE COCCI CRITICAL RESULT CALLED TO, READ BACK BY AND VERIFIED WITHErling Cruz: E JACKSON  [facility] LPHARMD 09/13/20 0347 JDW Performed at [facility], [address] [place], [place] [address]    Culture STAPHYLOCOCCUS AUREUS (A)  Final   Report Status 09/16/2020 FINAL  Final   Organism ID, Bacteria STAPHYLOCOCCUS AUREUS  Final      Susceptibility   Staphylococcus aureus - MIC*    CIPROFLOXACIN <=0.5 SENSITIVE Sensitive     ERYTHROMYCIN <=0.25 SENSITIVE Sensitive     GENTAMICIN <=0.5 SENSITIVE Sensitive     OXACILLIN <=0.25 SENSITIVE Sensitive     TETRACYCLINE <=1 SENSITIVE Sensitive     VANCOMYCIN <=0.5 SENSITIVE Sensitive     TRIMETH/SULFA <=10 SENSITIVE Sensitive     CLINDAMYCIN <=0.25 SENSITIVE Sensitive     RIFAMPIN <=0.5 SENSITIVE Sensitive     Inducible Clindamycin NEGATIVE Sensitive     * STAPHYLOCOCCUS AUREUS  Blood Culture ID Panel (Reflexed)     Status: Abnormal   Collection Time: 09/13/20 10:45 AM  Result Value Ref Range Status   Enterococcus faecalis NOT DETECTED NOT DETECTED Final   Enterococcus Faecium NOT DETECTED NOT DETECTED Final   Listeria monocytogenes NOT DETECTED NOT DETECTED Final   Staphylococcus species DETECTED (A) NOT DETECTED Final    Comment: CRITICAL RESULT CALLED TO, READ BACK BY AND VERIFIED WITH: E JACKSON PHARMD 09/13/20 0347 JDW    Staphylococcus aureus (BCID) DETECTED (A) NOT DETECTED  Final    Comment: CRITICAL RESULT CALLED TO, READ BACK BY AND VERIFIED WITH: E JACKSON PHARMD 09/13/20 0347 JDW    Staphylococcus epidermidis NOT DETECTED NOT DETECTED Final   Staphylococcus lugdunensis NOT DETECTED NOT DETECTED Final   Streptococcus species NOT DETECTED NOT DETECTED Final   Streptococcus agalactiae NOT DETECTED NOT DETECTED Final   Streptococcus pneumoniae NOT DETECTED NOT DETECTED Final   Streptococcus pyogenes NOT DETECTED NOT DETECTED Final   A.calcoaceticus-baumannii NOT DETECTED NOT DETECTED Final   Bacteroides fragilis NOT DETECTED NOT DETECTED Final   Enterobacterales NOT DETECTED NOT DETECTED Final   Enterobacter cloacae  complex NOT DETECTED NOT DETECTED Final   Escherichia coli NOT DETECTED NOT DETECTED Final   Klebsiella aerogenes NOT DETECTED NOT DETECTED Final   Klebsiella oxytoca NOT DETECTED NOT DETECTED Final   Klebsiella pneumoniae NOT DETECTED NOT DETECTED Final   Proteus species NOT DETECTED NOT DETECTED Final   Salmonella species NOT DETECTED NOT DETECTED Final   Serratia marcescens NOT DETECTED NOT DETECTED Final   Haemophilus influenzae NOT DETECTED NOT DETECTED Final   Neisseria meningitidis NOT DETECTED NOT DETECTED Final   Pseudomonas aeruginosa NOT DETECTED NOT DETECTED Final   Stenotrophomonas maltophilia NOT DETECTED NOT DETECTED Final   Candida albicans NOT DETECTED NOT DETECTED Final   Candida auris NOT DETECTED NOT DETECTED Final   Candida glabrata NOT DETECTED NOT DETECTED Final   Candida krusei NOT DETECTED NOT DETECTED Final   Candida parapsilosis NOT DETECTED NOT DETECTED Final   Candida tropicalis NOT DETECTED NOT DETECTED Final   Cryptococcus neoformans/gattii NOT DETECTED NOT DETECTED Final   Meth resistant mecA/C and MREJ NOT DETECTED NOT DETECTED Final    Comment: Performed at [facility], [address] [place], [place] [address]  Culture, blood (routine x 2)     Status: Abnormal   Collection Time: 09/13/20 11:20 AM   Specimen: BLOOD  Result Value Ref Range Status   Specimen Description   Final    BLOOD RIGHT ANTECUBITAL Performed at [facility], [address], [place], [place] [address]    Special Requests   Final    BOTTLES DRAWN AEROBIC AND ANAEROBIC Blood Culture adequate volume Performed at [facility], [address] [place], [place] [address]    Culture  Setup Time   Final    IN BOTH AEROBIC AND ANAEROBIC BOTTLES GRAM POSITIVE COCCI CRITICAL VALUE NOTED.  VALUE IS CONSISTENT WITH PREVIOUSLY REPORTED AND CALLED VALUE.    Culture (A)  Final    STAPHYLOCOCCUS AUREUS SUSCEPTIBILITIES PERFORMED ON PREVIOUS  CULTURE WITHIN THE LAST 5 DAYS. Performed at [facility], [address], [place], [place] [address]    Report Status 09/16/2020 FINAL  Final  Resp Panel by RT-PCR (Flu A&B, Covid) Nasopharyngeal Swab     Status: None   Collection Time: 09/13/20  2:01 PM   Specimen: Nasopharyngeal Swab; Nasopharyngeal(NP) swabs in vial transport medium  Result Value Ref Range Status   SARS Coronavirus 2 by RT PCR NEGATIVE NEGATIVE Final    Comment: (NOTE) SARS-CoV-2 target nucleic acids are NOT DETECTED.  The SARS-CoV-2 RNA is generally detectable in upper respiratory specimens during the acute phase of infection. The lowest concentration of SARS-CoV-2 viral copies this assay can detect is 138 copies/mL. A negative result does not preclude SARS-Cov-2 infection and should not be used as the sole basis for treatment or other patient management decisions. A negative result may occur with  improper specimen collection/handling,  submission of specimen other than nasopharyngeal swab, presence of viral mutation(s) within the areas targeted by this assay, and inadequate number of viral copies(<138 copies/mL). A negative result must be combined with clinical observations, patient history, and epidemiological information. The expected result is Negative.  Fact Sheet for Patients:  BloggerCourse.com  Fact Sheet for Healthcare Providers:  SeriousBroker.it  This test is no t yet approved or cleared by the [place] FDA and  has been authorized for detection and/or diagnosis of SARS-CoV-2 by FDA under an Emergency Use Authorization (EUA). This EUA will remain  in effect (meaning this test can be used) for the duration of the COVID-19 declaration under Section 564(b)(1) of the Act, 21 U.S.C.section 360bbb-3(b)(1), unless the authorization is terminated  or revoked sooner.       Influenza A by PCR NEGATIVE NEGATIVE Final   Influenza B by PCR  NEGATIVE NEGATIVE Final    Comment: (NOTE) The Xpert Xpress SARS-CoV-2/FLU/RSV plus assay is intended as an aid in the diagnosis of influenza from Nasopharyngeal swab specimens and should not be used as a sole basis for treatment. Nasal washings and aspirates are unacceptable for Xpert Xpress SARS-CoV-2/FLU/RSV testing.  Fact Sheet for Patients: BloggerCourse.com  Fact Sheet for Healthcare Providers: SeriousBroker.it  This test is not yet approved or cleared by the [place] FDA and has been authorized for detection and/or diagnosis of SARS-CoV-2 by FDA under an Emergency Use Authorization (EUA). This EUA will remain in effect (meaning this test can be used) for the duration of the COVID-19 declaration under Section 564(b)(1) of the Act, 21 U.S.C. section 360bbb-3(b)(1), unless the authorization is terminated or revoked.  Performed at [facility], [address] [place], [place] [address]   Culture, blood (routine x 2)     Status: None (Preliminary result)   Collection Time: 09/14/20  9:23 AM   Specimen: BLOOD RIGHT HAND  Result Value Ref Range Status   Specimen Description   Final    BLOOD RIGHT HAND Performed at [facility], [address], [place], [place] [address]    Special Requests   Final    BOTTLES DRAWN AEROBIC AND ANAEROBIC Blood Culture adequate volume Performed at [facility], [address] [place], [place] [address]    Culture   Final    NO GROWTH 4 DAYS Performed at [facility], [address], [place], [place] [address]    Report Status PENDING  Incomplete  Culture, blood (routine x 2)     Status: None (Preliminary result)   Collection Time: 09/14/20  9:24 AM   Specimen: BLOOD LEFT HAND  Result Value Ref Range Status   Specimen Description   Final    BLOOD LEFT HAND Performed at [facility], [address],  [place], [place] [address]    Special Requests   Final    BOTTLES DRAWN AEROBIC ONLY Blood Culture adequate volume Performed at [facility], [address], [place], [place] [address]    Culture   Final    NO GROWTH 4 DAYS Performed at [facility], [address] [place], [place] [address]    Report Status PENDING  Incomplete  Aerobic/Anaerobic Culture (surgical/deep wound)     Status: None   Collection Time: 09/14/20 12:37 PM   Specimen: Abscess  Result Value Ref Range Status   Specimen Description   Final    ABSCESS  LEFT RP Performed at [facility], [address], [place], [place] [address]    Special Requests   Final    Normal Performed at [facility], [address], [place], [place] [address]    Gram Stain   Final    MODERATE WBC PRESENT, PREDOMINANTLY PMN ABUNDANT GRAM POSITIVE COCCI ABUNDANT GRAM NEGATIVE RODS    Culture   Final    ABUNDANT STAPHYLOCOCCUS AUREUS MODERATE BACTEROIDES ORALIS FEW PREVOTELLA BIVIA BETA LACTAMASE POSITIVE Performed at [facility], [address], [place], [place] [address]    Report Status 09/19/2020 FINAL  Final   Organism ID, Bacteria STAPHYLOCOCCUS AUREUS  Final      Susceptibility   Staphylococcus aureus - MIC*    CIPROFLOXACIN <=0.5 SENSITIVE Sensitive     ERYTHROMYCIN <=0.25 SENSITIVE Sensitive     GENTAMICIN <=0.5 SENSITIVE Sensitive     OXACILLIN <=0.25 SENSITIVE Sensitive     TETRACYCLINE <=1 SENSITIVE Sensitive     VANCOMYCIN <=0.5 SENSITIVE Sensitive     TRIMETH/SULFA <=10 SENSITIVE Sensitive     CLINDAMYCIN <=0.25 SENSITIVE Sensitive     RIFAMPIN <=0.5 SENSITIVE Sensitive     Inducible Clindamycin NEGATIVE Sensitive     * ABUNDANT STAPHYLOCOCCUS AUREUS  MRSA PCR Screening     Status: None   Collection Time: 09/15/20  3:22 AM   Specimen: Nasal Mucosa; Nasopharyngeal  Result Value Ref Range Status   MRSA by PCR NEGATIVE NEGATIVE Final    Comment:         The GeneXpert MRSA Assay (FDA approved for NASAL specimens only), is one component of a comprehensive MRSA colonization surveillance program. It is not intended to diagnose MRSA infection nor to guide or monitor treatment for MRSA infections. Performed at [facility], [address], [place], [place] [address]     Impression/Plan:  1.  Disseminated MSSA - blood cultures clearing, no vegetation on TEE.  He will continue with cefazolin.  2.  Hip/thigh abscess - drains placed and continues to drain.  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  He remains on cefazolin and will need a prolonged 6 week course followed by oral extension for a prolonged period.    3.  Access - will need a picc line at some point. OK to place if needed now but prefer to wait with the ongoing abscess and drainage.   Will consider rifampin as well.

## 2020-09-19 NOTE — Progress Notes (Signed)
PROGRESS NOTE    [name]  BTD:[id number] DOB: [birth year] DOA: 09/13/2020 PCP: [name], PA   Brief Narrative:  HPI per Dr. Cherylann Ratel on 09/13/20 [name] is a 35 y.o. male with medical history significant of AVN of hip, HTN, GERD. Presenting with fatigue and left leg pain. Recent admission for COVID (08/22/20). Successfully completed therapy and was discharged on 08/29/20. At that time he was discharged to treatment for alcoholic hepatitis as well. He was ok for a few days after discharge, but he began to feel very tired and weak. He did not try any medicines to help. He tried rest. This progressed over these last 2 weeks. Also during this time, he began having left thigh and leg pain. He reports that the left thigh had become increasingly swollen and tender to touch. He has had a blister rupture on the inner thigh in the groin area. These symptoms increased through 2 days ago when he was no longer ago to walk without great pain. He saw his PCP yesterday who drew labs. He was called this morning by that team and advised to go to the ED immediately. He denies any other aggravating or alleviating factors.   ED Course: He was found to have a Hgb of 6.2. pRBCs were ordered. He was FOBT negative. CT imaging showed left iliopsoas and left gluteal abscesses w/ diffuse swelling and left hip effusion. CCS was consulted. Patient was started on cefazolin and vanc. TRH was called for admission.   **Interim History  Patient now has an MSSA bacteremia so ID was consulted and blood cultures repeated an echocardiogram was obtained.  Because of his abscesses and his iliopsoas muscle and multiple normal abscesses in the legs he underwent 3 drain placement by IR.  Orthopedic surgery is evaluating and planning for surgical intervention 09/15/20.  Blood count is improved after 2 units of PRBCs and he will be getting more units today for his Left Hip Interposition Arthoplasty today.  Blood cultures  are no growth to date so far and he continues to be on IV cefazolin however infectious diseases recommending a TEE so we will contact cardiology to set this up will be done per their scheduling  After his left hip total plasty with antibiotic spacer placement and debridement he had septic shock and decompensated and had hypotension despite 2 unit PRBCs, 2.2 L of crystalloid and 500 mL of albumin.  He was admitted to the ICU and started on peripheral pressors and critical care assumed care for 09/16/2020 and now is back on [facility] service.  He appears volume overloaded from all the fluid that he is gotten and has hepatic cirrhosis so likely has some anasarca so we will give him a dose of IV Lasix today.    He continues to be volume overloaded so was given another dose of IV Lasix however his lactic acid level started trending slightly upwards and went from 2.8 -> 3.1 so was given a 250 mL bolus.  PT OT recommending CIR.  Scrotal ultrasound was done given his scrotal swelling and did not show any evidence of any testicular mass or torsion but did show asymmetric left scrotal wall thickening with fluid and increased blood so due to soft tissue infection related to his more likely widespread process in the left pelvis and thigh.  We will need to continue to monitor this for abscess formation but continue antibiotics with IV cefazolin and ID adding Flagyl for Anaerobic Coverage .  His beta-blocker  was resumed yesterday as well given that he was tachycardic and likely had a rebound tachycardia.  We will go up on his beta-blocker give another dose of IV Lasix today.  Patient underwent a TEE which showed the LVEF and RVEF were normal with normal valves and no evidence of vegetation.  Assessment & Plan:   Principal Problem:   MSSA bacteremia Active Problems:   Alcohol dependence ([place])   Alcoholic hepatitis   Sepsis ([place])   Septic arthritis ([place])   Iliopsoas abscess on left ([place])  Iliopsoas and left gluteal  abscesses associated with MSSA Bacteremia  Septic shock secondary to above and left hip septic arthritis Left hip effusion and septic arthritis in the setting of avascular necrosis -Admitted to Inpatient Telemetry  -Initially started on IV vanc, cefepime; pharmacy to dose but changed to IV Cefazolin by ID -Repeat Blood Cx x2 and obtain ECHO; See below -Initial Blood Cx 09/13/20 showed Staph Aureus in Both sets of Cx's -Fluids had initially stopped but then he received 2.2 crystalloid boluses as well as 2 units of PRBCs postoperatively -Sepsis Criteria met with: tachycardia, tachypnea, lactic acid 3.3, and Leukocytosis was 10.6 source: abscesses as above as well as bacteremia and went into septic shock during this admission -PCT was 3.00 and trended up to 21.56 -> 7.78 and today it is 4.14 -WBC has gone from 10.6 -> 8.3 -> 12.8 -> 31.6 -> 12.1 -> 15.1 and is now 14.5 -Lactic trended up to 5.3 and then trended down to 2.8 and is 3.1 yesterday but was not repeated -General surgery recs IR involvement for possible abscess drainage and for ortho to review (CCS consulted IR, EDP has consulted ortho); appreciate assistance, will await further recs -Surgery had planned a total hip replacement early March and plan is to currently drain iliopsoas abscess today and regular pulse the patient for a left hip interposition arthroplasty tomorrow with Dr. Mardelle Matte and will continue antibiotics and they will make the patient n.p.o. at midnight today -Interventional radiology 3 drains in place today via CT-guided placement with a 14 French drainage catheter into the left retroperitoneal abscess into 12 French drains in the medial and lateral abscesses with the anterior aspect of left thigh and there is a total of 1.3 L of purulent fluid that was aspirated following drain placement sent for evaluation -Interventional Radiology evaluated the drains and they are recommending continue drain management and continue every shift  flushes and monitoring of output; he is going to go to the OR today and if the drains are remain and the following plan for this is to repeat a CT scan when his drain output is less than 10 cc/day to assess for possible removal -Drain Gram Stain showing Moderate WBC, Abundant Gram Positive Cocci, and Abundant Gram Negative Rods with Cultures showing abundant Staph Aureus which is pan sensitive -Infectious disease is now recommending a prolonged 6-week course of IV cefazolin and he will need oral medications after for extension and they recommend that is okay to place a PICC but they are recommending to wait there is ongoing abscess and drainage could also consider rifampin at some point. -Infectious diseases consulted and recommending changing antibiotic to cefazolin and following repeat echocardiogram; ECHO as below did not mention any vegetation; infectious diseases is now recommending the addition of Flagyl -Repeat blood cultures have been done and show NGTD at 5 Days; since if TTE is negative they can recommend a transesophageal echocardiogram then but since his TTE was a difficult Study,  ID Dr. Juleen China recommending TEE given Disseminated MSSA infection so will discuss with Cardiology to do an this will be done per cardiology scheduling and likely this will be done Monday, 09/19/2020. -Patient underwent a repeat CT scan of the abdomen pelvis as well as the femur and showed " Interval placement of left pelvic and two left thigh drains for multiple abscesses. The pelvic components of the fluid collections have mildly improved, but there are multiple persistent large peripherally enhancing  complex fluid collections within the left thigh which are incompletely drained. These are quite extensive in length, extending into the distal thigh. The proximal component of the anteromedial collection has  slightly enlarged. No evidence of osteomyelitis or large hip joint effusion. 3. Interval left total hip arthroplasty.  The hardware appears well positioned." -Because of this orthopedic surgery reviewed the CT scan and I feel the best course of action is to take the patient for an open irrigation debridement of the medial and lateral thigh to express these fluid collections and recommending n.p.o. at midnight -Continue PT OT evaluation and continue dressing changes per orthopedic surgery and VT prophylaxis further recommendations but currently this is held given his anemia and thrombocytopenia -PT/OT recommending CIR but once he is only cleared from a medical perspective and once orthopedic surgery signed off the case and has no further debridement plans  Symptomatic Anemia Macrocytic Anemia -Presented with Hgb/Hct of 6.2/18.9 -Patient is status post 4 units of PRBCs total and hemoglobin/hematocrit went from 7.5/22.5 -> 9.8/30.2 and today it is 6 -FOBT negative -Anemia panel done and showed an iron level of 44, U IBC 131, TIBC 175, saturation ratios of 25% -check LDH and was 308, haptoglobin pending, iron studies showed an iron level of 44, U IBC 131, TIBC 175, saturation ratios of 25% -Continue to monitor for signs and symptoms of bleeding; currently no overt bleeding noted next- -repeat CBC in a.m.  Hx of Alcoholic Hepatitis Hepatic Cirrhosis Hyperbilirubinemia, was improving and now worsening  Mildly abnormal AST -Was continiung Prednisolone (he is on 23m through 09/26/20 and then tapers) but given his disseminated MSSA bacteremia will discontinue taper given LFTs being improved and because T Bili Trending down and is gone from 3.2 -> 1.3 -> 1.7 again. I spoke with Dr. KTherisa Doyneof Gastroenterology who feels the patient does not need taper now that this has been discontinued -Check PT-INR in the AM and CMP again in the AM; PT/INR was 17.3 and INR was 1.5 -Patient and family report he has been alcohol free for 1 month -Continue lactulose 10 g p.o. twice daily, thiamine, magnesium -Patient's ammonia level was 45  yesterday not repeated today but will repeat again in the morning as well as continuing to monitor his PT and INR but this was not done today -Patient was given IV fluid hydration with LR and will stop given his hepatic cirrhosis and poor albumin status; given a bolus yesterday given his and lactic acid being elevated -AST is now improved and normalized to 39 -Continue monitor hepatic function daily and if worsening will call GI for further assistance but at this time will discontinue the prednisone taper   Hx of Tachycardia; Now in Sinus Tachycardia  HTN  -Baseline heart rate is anywhere between 100-110 per family -Continue Carvedilol 3.125 mg p.o. twice daily (held due to hypotension but now resumed) and will increase the dose to 6.2 5 in the AM  GAD -Continue sertraline 25 mg p.o. nightly  GERD -C/w Pantoprazole 40 mg p.o.  twice daily  Hyponatremia -Was initiated on IV fluids with normal saline but this is now stopped  -Sodium is now 129 again today -He was given a dose of IV sodium phosphate yesterday -We will give another dose of IV Lasix as he has received 1 daily for the last 2 days -Continue to monitor and trend and repeat CMP in a.m.  Metabolic Acidosis -Mild and is now improved -Patient CO2 is now 24, anion gap is 9, chloride level is 96 -IV fluid hydration was resumed by the ICU team given his septic shock but will now discontinue given his volume overload and anasarca -Continue to monitor and trend and repeat CMP in a.m.  Hypophosphatemia -Patient's phosphorus level was 1.6 and improved to 3.4 -Continue to monitor and replete as necessary -Repeat CBC in a.m.  Scrotal Edema and Anasarca, persistent -Patient is positive +4.343 L since admission -In the setting of his hepatic cirrhosis and IV fluid resuscitation as well as his infection -Stopped IV fluids and recommend scrotal elevation and sling and obtain a scrotal ultrasound and Doppler to rule out any  pathology -Scrotal U/S done and showed "No evidence of testicular mass or torsion. Marked asymmetric left scrotal wall thickening and fluid, with increased blood flow. This is likely due to soft tissue infection, and part of the much more widespread process in the left pelvis and thigh demonstrated on prior CT." -Continue Foley for today and will give another dose of IV Lasix 40 mg x 1 again today -Foley to be removed as patient is getting up with Therapy  -Trial of void early in the morning and discontinue Foley at that time -continue with IV Lasix and received another dose today -Continue with Extremity Elevation   Thrombocytopenia  -Patient's platelet count had dropped from 170,000 and could be in the setting of his cirrhosis and surgical issues and his platelet count is now 85,000 -Continue to monitor for signs and symptoms of bleeding; currently no overt bleeding noted -Repeat CBC in a.m.  DVT prophylaxis: SCDs Code Status: FULL CODE  Family Communication: Discussed with family over the phone while patient was on the phone with them Disposition Plan: Pending further clinical improvement and evaluation and clearance by specialist; likely to go back to the OR in the morning  Status is: Inpatient  Remains inpatient appropriate because:Unsafe d/c plan, IV treatments appropriate due to intensity of illness or inability to take PO and Inpatient level of care appropriate due to severity of illness   Dispo: The patient is from: Home              Anticipated d/c is to: TBD; PT OT to evaluate and treat              Anticipated d/c date is: 3 days              Patient currently is not medically stable to d/c.   Difficult to place patient No   Consultants:   General Surgery  Orthopedic Surgery  Infectious Diseases  Interventional Radiology  Case was discussed with Gastroenterology    Procedures:  ECHOCARDIOGRAM IMPRESSIONS    1. Left ventricular ejection fraction, by  estimation, is 60 to 65%. The  left ventricle has normal function. The left ventricle has no regional  wall motion abnormalities. There is moderate concentric left ventricular  hypertrophy. Left ventricular  diastolic function could not be evaluated. Elevated left ventricular  end-diastolic pressure.  2. Right ventricular systolic function is normal. The right ventricular  size is normal. Tricuspid regurgitation signal is inadequate for assessing  PA pressure.  3. The mitral valve is normal in structure. No evidence of mitral valve  regurgitation. No evidence of mitral stenosis.  4. The aortic valve is normal in structure. Aortic valve regurgitation is  not visualized. No aortic stenosis is present.  5. The inferior vena cava is normal in size with greater than 50%  respiratory variability, suggesting right atrial pressure of 3 mmHg.  6. Trivial pericardial effusion is present. The pericardial effusion is  circumferential.   FINDINGS  Left Ventricle: Left ventricular ejection fraction, by estimation, is 60  to 65%. The left ventricle has normal function. The left ventricle has no  regional wall motion abnormalities. The left ventricular internal cavity  size was normal in size. There is  moderate concentric left ventricular hypertrophy. Left ventricular  diastolic function could not be evaluated. Elevated left ventricular  end-diastolic pressure.   Right Ventricle: The right ventricular size is normal. No increase in  right ventricular wall thickness. Right ventricular systolic function is  normal. Tricuspid regurgitation signal is inadequate for assessing PA  pressure.   Left Atrium: Left atrial size was normal in size.   Right Atrium: Right atrial size was normal in size.   Pericardium: Trivial pericardial effusion is present. The pericardial  effusion is circumferential.   Mitral Valve: The mitral valve is normal in structure. No evidence of  mitral valve  regurgitation. No evidence of mitral valve stenosis.   Tricuspid Valve: The tricuspid valve is normal in structure. Tricuspid  valve regurgitation is mild . No evidence of tricuspid stenosis.   Aortic Valve: The aortic valve is normal in structure. Aortic valve  regurgitation is not visualized. No aortic stenosis is present.   Pulmonic Valve: The pulmonic valve was normal in structure. Pulmonic valve  regurgitation is not visualized. No evidence of pulmonic stenosis.   Aorta: The aortic root is normal in size and structure.   Venous: The inferior vena cava is normal in size with greater than 50%  respiratory variability, suggesting right atrial pressure of 3 mmHg.   IAS/Shunts: No atrial level shunt detected by color flow Doppler.     LEFT VENTRICLE  PLAX 2D  LVIDd:     4.70 cm   Diastology  LVIDs:     2.80 cm   LV e' medial:  3.81 cm/s  LV PW:     1.50 cm   LV E/e' medial: 31.0  LV IVS:    1.40 cm   LV e' lateral:  6.64 cm/s  LVOT diam:   2.30 cm   LV E/e' lateral: 17.8  LV SV:     78  LV SV Index:  39  LVOT Area:   4.15 cm    LV Volumes (MOD)  LV vol d, MOD A2C: 69.7 ml  LV vol d, MOD A4C: 115.0 ml  LV vol s, MOD A2C: 25.9 ml  LV vol s, MOD A4C: 47.1 ml  LV SV MOD A2C:   43.8 ml  LV SV MOD A4C:   115.0 ml  LV SV MOD BP:   51.8 ml   IVC  IVC diam: 1.50 cm   LEFT ATRIUM      Index    RIGHT ATRIUM      Index  LA diam:   3.80 cm 1.93 cm/m RA Area:   12.30 cm  LA Vol (A2C): 39.5 ml 20.08 ml/m RA Volume:  23.60 ml 12.00 ml/m  LA  Vol (A4C): 32.6 ml 16.57 ml/m  AORTIC VALVE  LVOT Vmax:  130.00 cm/s  LVOT Vmean: 86.000 cm/s  LVOT VTI:  0.187 m    AORTA  Ao Root diam: 3.30 cm  Ao Asc diam: 3.20 cm   MITRAL VALVE  MV Area (PHT): 5.84 cm   SHUNTS  MV Decel Time: 130 msec   Systemic VTI: 0.19 m  MV E velocity: 118.00 cm/s Systemic Diam: 2.30 cm   DRAIN PLACEMENT  Septic  effusion of the left hip and associated RP and thigh abscesses Post procedural Dx: Same  Technically successful [place] and CT guided placement of a 14 Fr drainage catheter into left retroperitoneal abscess and two 12 Fr drains into medial and lateral abscesses within the anterior aspect of the left thigh.  A total of 1.3 L of purulent fluid was aspirated following drain placement. A representative aspirated sample was capped and sent to the laboratory for analysis.    Antimicrobials:  Anti-infectives (From admission, onward)   Start     Dose/Rate Route Frequency Ordered Stop   09/18/20 1400  metroNIDAZOLE (FLAGYL) tablet 500 mg        500 mg Oral Every 8 hours 09/18/20 0927     09/15/20 1715  tobramycin (NEBCIN) powder  Status:  Discontinued          As needed 09/15/20 1715 09/15/20 2043   09/15/20 1715  vancomycin (VANCOCIN) powder  Status:  Discontinued          As needed 09/15/20 1716 09/15/20 2043   09/14/20 0600  ceFAZolin (ANCEF) IVPB 2g/100 mL premix        2 g 200 mL/hr over 30 Minutes Intravenous Every 8 hours 09/14/20 0359     09/13/20 2200  vancomycin (VANCOREADY) IVPB 1500 mg/300 mL  Status:  Discontinued        1,500 mg 150 mL/hr over 120 Minutes Intravenous Every 12 hours 09/13/20 1636 09/14/20 0359   09/13/20 1500  ceFEPIme (MAXIPIME) 2 g in sodium chloride 0.9 % 100 mL IVPB  Status:  Discontinued        2 g 200 mL/hr over 30 Minutes Intravenous Every 8 hours 09/13/20 1437 09/14/20 0359   09/13/20 1130  vancomycin (VANCOREADY) IVPB 1750 mg/350 mL        1,750 mg 175 mL/hr over 120 Minutes Intravenous  Once 09/13/20 1107 09/13/20 1414   09/13/20 1045  ceFAZolin (ANCEF) IVPB 1 g/50 mL premix        1 g 100 mL/hr over 30 Minutes Intravenous  Once 09/13/20 1031 09/13/20 1307        Subjective: Seen and examined at bedside and he states he is still hurting and still concerned about his scrotal swelling.  No nausea or vomiting.  Happy that his TEE was normal.  Denies any  lightheadedness or dizziness or shortness of breath but heart rate is still elevated.  Recommended to keep his extremities elevated.  Orthopedic surgery is planning on taking him for further surgical intervention with an open irrigation debridement in the morning.  No other concerns or complaints at this time..  Objective: Vitals:   09/19/20 1300 09/19/20 1400 09/19/20 1500 09/19/20 1600  BP: 119/71 (!) 114/57 120/67 131/65  Pulse: (!) 113 (!) 107 (!) 109 (!) 117  Resp: _0 Temp:      TempSrc:      SpO2: 97% 97% 98% 98%  Weight:      Height:  Intake/Output Summary (Last 24 hours) at 09/19/2020 1657 Last data filed at 09/19/2020 1600 Gross per 24 hour  Intake 1474.42 ml  Output 885 ml  Net 589.42 ml   Filed Weights   09/15/20 2111 09/19/20 0500 09/19/20 0843  Weight: 85.1 kg 91.8 kg 97.8 kg   Examination: Physical Exam:  Constitutional: WN/WD overweight Caucasian male currently no acute distress but does appear uncomfortable and complained of pain today.   Eyes: Lids and conjunctivae normal, sclerae anicteric  ENMT: External Ears, Nose appear normal. Grossly normal hearing.  Neck: Appears normal, supple, no cervical masses, normal ROM, no appreciable thyromegaly; no JVD Respiratory: Diminished to auscultation bilaterally, no wheezing, rales, rhonchi or crackles. Normal respiratory effort and patient is not tachypenic. No accessory muscle use.  Cardiovascular: Tachycardic rate but regular rhythm with heart rates in the 120s, no murmurs / rubs / gallops. S1 and S2 auscultated.  Has 1-2+ lower extremity edema worse on left compared to right Abdomen: Soft, non-tender, stented secondary body habitus. Bowel sounds positive.  GU: Scrotum is extremely swollen Musculoskeletal: No clubbing / cyanosis of digits/nails.  Has 3 drains in his left leg and left leg is extremely swollen Skin: Left leg is extremely swollen has 3 drains with murky drainage. No induration; Warm and dry.   Neurologic: CN 2-12 grossly intact with no focal deficits. Romberg sign and cerebellar reflexes not assessed.  Psychiatric: Normal judgment and insight. Alert and oriented x 3. Normal mood and appropriate affect.   Data Reviewed: I have personally reviewed following labs and imaging studies  CBC: Recent Labs  Lab 09/15/20 0450 09/15/20 1745 09/15/20 2052 09/16/20 0238 09/16/20 1419 09/17/20 0511 09/18/20 0303 09/19/20 0305  WBC 12.8*  --  24.4* 31.6*  --  12.1* 15.1* 14.5*  NEUTROABS 10.2*  --   --  26.2*  --  9.6* 12.2* 11.8*  HGB 8.0*   < > 8.0* 6.4* 7.9* 7.5* 9.8* 8.8*  HCT 24.4*   < > 24.0* 19.5* 23.3* 22.5* 30.2* 26.6*  MCV 103.4*  --  97.2 98.5  --  96.6 97.7 98.5  PLT 139*  --  86* 84*  --  72* 86* 85*   < > = values in this interval not displayed.   Basic Metabolic Panel: Recent Labs  Lab 09/15/20 2300 09/16/20 0238 09/17/20 0511 09/18/20 0303 09/19/20 0305  NA 132* 133* 131* 129* 129*  K 4.5 4.7 4.3 4.4 4.4  CL 103 104 102 98 96*  CO2 18* 17* _0 GLUCOSE 105* 127* 127* 91 100*  BUN _1 CREATININE 0.67 0.58* 0.38* 0.44* 0.41*  CALCIUM 7.1* 7.0* 7.2* 7.5* 7.4*  MG 2.0 1.9 2.0 1.9 1.9  PHOS 4.0 4.4 1.6* 1.6* 3.4   GFR: Estimated Creatinine Clearance: 149.7 mL/min (A) (by C-G formula based on SCr of 0.41 mg/dL (L)). Liver Function Tests: Recent Labs  Lab 09/15/20 0450 09/15/20 2300 09/17/20 0511 09/18/20 0303 09/19/20 0305  AST 42* 38 43* 61* 39  ALT _2 ALKPHOS 191* 123 165* 270* 244*  BILITOT 1.5* 3.2* 1.3* 1.7* 1.7*  PROT 5.8* 4.6* 4.9* 5.7* 5.2*  ALBUMIN 1.9* 2.1* 2.0* 2.3* 2.0*   No results for input(s): LIPASE, AMYLASE in the last 168 hours. Recent Labs  Lab 09/13/20 1520  AMMONIA 45*   Coagulation Profile: Recent Labs  Lab 09/13/20 1045 09/14/20 0525 09/15/20 2300 09/19/20 0305  INR 1.4* 1.5* 2.2* 1.5*   Cardiac Enzymes: No  results for input(s): CKTOTAL, CKMB, CKMBINDEX, TROPONINI in the last 168  hours. BNP (last 3 results) No results for input(s): PROBNP in the last 8760 hours. HbA1C: No results for input(s): HGBA1C in the last 72 hours. CBG: Recent Labs  Lab 09/15/20 2116 09/15/20 2152  GLUCAP 61* 86   Lipid Profile: No results for input(s): CHOL, HDL, LDLCALC, TRIG, CHOLHDL, LDLDIRECT in the last 72 hours. Thyroid Function Tests: No results for input(s): TSH, T4TOTAL, FREET4, T3FREE, THYROIDAB in the last 72 hours. Anemia Panel: No results for input(s): VITAMINB12, FOLATE, FERRITIN, TIBC, IRON, RETICCTPCT in the last 72 hours. Sepsis Labs: Recent Labs  Lab 09/14/20 0525 09/15/20 2052 09/15/20 2300 09/18/20 0843 09/18/20 1105 09/19/20 0305  PROCALCITON 3.00  --  21.56 7.78  --  4.14  LATICACIDVEN  --  5.0* 5.3* 2.8* 3.1*  --     Recent Results (from the past 240 hour(s))  Culture, blood (routine x 2)     Status: Abnormal   Collection Time: 09/13/20 10:45 AM   Specimen: BLOOD  Result Value Ref Range Status   Specimen Description   Final    BLOOD RIGHT ANTECUBITAL Performed at [facility], [place] [address] [place], [place] [address]    Special Requests   Final    BOTTLES DRAWN AEROBIC AND ANAEROBIC Blood Culture adequate volume Performed at [facility] [address], [place], [place] [address]    Culture  Setup Time   Final    IN BOTH AEROBIC AND ANAEROBIC BOTTLES GRAM POSITIVE COCCI CRITICAL RESULT CALLED TO, READ BACK BY AND VERIFIED WITHSeleta Rhymes [facility] 09/13/20 0347 JDW Performed at [facility], [place] [address] [place], [place] [address]    Culture STAPHYLOCOCCUS AUREUS (A)  Final   Report Status 09/16/2020 FINAL  Final   Organism ID, Bacteria STAPHYLOCOCCUS AUREUS  Final      Susceptibility   Staphylococcus aureus - MIC*    CIPROFLOXACIN <=0.5 SENSITIVE Sensitive     ERYTHROMYCIN <=0.25 SENSITIVE Sensitive     GENTAMICIN <=0.5 SENSITIVE Sensitive     OXACILLIN <=0.25 SENSITIVE Sensitive      TETRACYCLINE <=1 SENSITIVE Sensitive     VANCOMYCIN <=0.5 SENSITIVE Sensitive     TRIMETH/SULFA <=10 SENSITIVE Sensitive     CLINDAMYCIN <=0.25 SENSITIVE Sensitive     RIFAMPIN <=0.5 SENSITIVE Sensitive     Inducible Clindamycin NEGATIVE Sensitive     * STAPHYLOCOCCUS AUREUS  Blood Culture ID Panel (Reflexed)     Status: Abnormal   Collection Time: 09/13/20 10:45 AM  Result Value Ref Range Status   Enterococcus faecalis NOT DETECTED NOT DETECTED Final   Enterococcus Faecium NOT DETECTED NOT DETECTED Final   Listeria monocytogenes NOT DETECTED NOT DETECTED Final   Staphylococcus species DETECTED (A) NOT DETECTED Final    Comment: CRITICAL RESULT CALLED TO, READ BACK BY AND VERIFIED WITH: E JACKSON PHARMD 09/13/20 0347 JDW    Staphylococcus aureus (BCID) DETECTED (A) NOT DETECTED Final    Comment: CRITICAL RESULT CALLED TO, READ BACK BY AND VERIFIED WITH: E JACKSON PHARMD 09/13/20 0347 JDW    Staphylococcus epidermidis NOT DETECTED NOT DETECTED Final   Staphylococcus lugdunensis NOT DETECTED NOT DETECTED Final   Streptococcus species NOT DETECTED NOT DETECTED Final   Streptococcus agalactiae NOT DETECTED NOT DETECTED Final   Streptococcus pneumoniae NOT DETECTED NOT DETECTED Final   Streptococcus pyogenes NOT DETECTED NOT DETECTED Final   A.calcoaceticus-baumannii NOT DETECTED NOT DETECTED Final   Bacteroides fragilis NOT  DETECTED NOT DETECTED Final   Enterobacterales NOT DETECTED NOT DETECTED Final   Enterobacter cloacae complex NOT DETECTED NOT DETECTED Final   Escherichia coli NOT DETECTED NOT DETECTED Final   Klebsiella aerogenes NOT DETECTED NOT DETECTED Final   Klebsiella oxytoca NOT DETECTED NOT DETECTED Final   Klebsiella pneumoniae NOT DETECTED NOT DETECTED Final   Proteus species NOT DETECTED NOT DETECTED Final   Salmonella species NOT DETECTED NOT DETECTED Final   Serratia marcescens NOT DETECTED NOT DETECTED Final   Haemophilus influenzae NOT DETECTED NOT DETECTED Final    Neisseria meningitidis NOT DETECTED NOT DETECTED Final   Pseudomonas aeruginosa NOT DETECTED NOT DETECTED Final   Stenotrophomonas maltophilia NOT DETECTED NOT DETECTED Final   Candida albicans NOT DETECTED NOT DETECTED Final   Candida auris NOT DETECTED NOT DETECTED Final   Candida glabrata NOT DETECTED NOT DETECTED Final   Candida krusei NOT DETECTED NOT DETECTED Final   Candida parapsilosis NOT DETECTED NOT DETECTED Final   Candida tropicalis NOT DETECTED NOT DETECTED Final   Cryptococcus neoformans/gattii NOT DETECTED NOT DETECTED Final   Meth resistant mecA/C and MREJ NOT DETECTED NOT DETECTED Final    Comment: Performed at [facility], [address] [place], [place] [address]  Culture, blood (routine x 2)     Status: Abnormal   Collection Time: 09/13/20 11:20 AM   Specimen: BLOOD  Result Value Ref Range Status   Specimen Description   Final    BLOOD RIGHT ANTECUBITAL Performed at [facility] [address], [place], [place] [address]    Special Requests   Final    BOTTLES DRAWN AEROBIC AND ANAEROBIC Blood Culture adequate volume Performed at [facility] [address] [place], [place] [address]    Culture  Setup Time   Final    IN BOTH AEROBIC AND ANAEROBIC BOTTLES GRAM POSITIVE COCCI CRITICAL VALUE NOTED.  VALUE IS CONSISTENT WITH PREVIOUSLY REPORTED AND CALLED VALUE.    Culture (A)  Final    STAPHYLOCOCCUS AUREUS SUSCEPTIBILITIES PERFORMED ON PREVIOUS CULTURE WITHIN THE LAST 5 DAYS. Performed at [facility], [place] [address] [place], [place] [address]    Report Status 09/16/2020 FINAL  Final  Resp Panel by RT-PCR (Flu A&B, Covid) Nasopharyngeal Swab     Status: None   Collection Time: 09/13/20  2:01 PM   Specimen: Nasopharyngeal Swab; Nasopharyngeal(NP) swabs in vial transport medium  Result Value Ref Range Status   SARS Coronavirus 2 by RT PCR NEGATIVE NEGATIVE Final    Comment: (NOTE) SARS-CoV-2 target  nucleic acids are NOT DETECTED.  The SARS-CoV-2 RNA is generally detectable in upper respiratory specimens during the acute phase of infection. The lowest concentration of SARS-CoV-2 viral copies this assay can detect is 138 copies/mL. A negative result does not preclude SARS-Cov-2 infection and should not be used as the sole basis for treatment or other patient management decisions. A negative result may occur with  improper specimen collection/handling, submission of specimen other than nasopharyngeal swab, presence of viral mutation(s) within the areas targeted by this assay, and inadequate number of viral copies(<138 copies/mL). A negative result must be combined with clinical observations, patient history, and epidemiological information. The expected result is Negative.  Fact Sheet for Patients:  EntrepreneurPulse.com.au  Fact Sheet for Healthcare Providers:  IncredibleEmployment.be  This test is no t yet approved or cleared by the [place] FDA and  has been authorized for detection and/or diagnosis of SARS-CoV-2 by FDA under an  Emergency Use Authorization (EUA). This EUA will remain  in effect (meaning this test can be used) for the duration of the COVID-19 declaration under Section 564(b)(1) of the Act, 21 U.S.C.section 360bbb-3(b)(1), unless the authorization is terminated  or revoked sooner.       Influenza A by PCR NEGATIVE NEGATIVE Final   Influenza B by PCR NEGATIVE NEGATIVE Final    Comment: (NOTE) The Xpert Xpress SARS-CoV-2/FLU/RSV plus assay is intended as an aid in the diagnosis of influenza from Nasopharyngeal swab specimens and should not be used as a sole basis for treatment. Nasal washings and aspirates are unacceptable for Xpert Xpress SARS-CoV-2/FLU/RSV testing.  Fact Sheet for Patients: EntrepreneurPulse.com.au  Fact Sheet for Healthcare  Providers: IncredibleEmployment.be  This test is not yet approved or cleared by the [place] FDA and has been authorized for detection and/or diagnosis of SARS-CoV-2 by FDA under an Emergency Use Authorization (EUA). This EUA will remain in effect (meaning this test can be used) for the duration of the COVID-19 declaration under Section 564(b)(1) of the Act, 21 U.S.C. section 360bbb-3(b)(1), unless the authorization is terminated or revoked.  Performed at [facility], [place] [address] [place], [place] [address]   Culture, blood (routine x 2)     Status: None   Collection Time: 09/14/20  9:23 AM   Specimen: BLOOD RIGHT HAND  Result Value Ref Range Status   Specimen Description   Final    BLOOD RIGHT HAND Performed at [facility] [address], [place], [place] [address]    Special Requests   Final    BOTTLES DRAWN AEROBIC AND ANAEROBIC Blood Culture adequate volume Performed at [facility] [address], [place], [place] [address]    Culture   Final    NO GROWTH 5 DAYS Performed at [facility], [place] [address] [place], [place] [address]    Report Status 09/19/2020 FINAL  Final  Culture, blood (routine x 2)     Status: None   Collection Time: 09/14/20  9:24 AM   Specimen: BLOOD LEFT HAND  Result Value Ref Range Status   Specimen Description   Final    BLOOD LEFT HAND Performed at [facility] [address] [place], [place] [address]    Special Requests   Final    BOTTLES DRAWN AEROBIC ONLY Blood Culture adequate volume Performed at [facility] [address] [place], [place] [address]    Culture   Final    NO GROWTH 5 DAYS Performed at [facility], [place] [address] [place], [place] [address]    Report Status 09/19/2020 FINAL  Final  Aerobic/Anaerobic Culture (surgical/deep wound)     Status: None   Collection Time: 09/14/20 12:37  PM   Specimen: Abscess  Result Value Ref Range Status   Specimen Description   Final    ABSCESS  LEFT RP Performed at [facility] [address] [place],  [address]    Special Requests   Final    Normal Performed at [facility], [place] [address] [place], [place] [address]    Gram Stain   Final    MODERATE WBC PRESENT, PREDOMINANTLY PMN ABUNDANT GRAM POSITIVE COCCI ABUNDANT GRAM NEGATIVE RODS    Culture   Final    ABUNDANT STAPHYLOCOCCUS AUREUS MODERATE BACTEROIDES ORALIS FEW PREVOTELLA BIVIA BETA LACTAMASE POSITIVE Performed at [facility], [place] [address] [place], [place]  [address]    Report Status 09/19/2020 FINAL  Final   Organism ID, Bacteria STAPHYLOCOCCUS AUREUS  Final      Susceptibility   Staphylococcus aureus - MIC*    CIPROFLOXACIN <=0.5 SENSITIVE Sensitive     ERYTHROMYCIN <=0.25 SENSITIVE Sensitive     GENTAMICIN <=0.5 SENSITIVE Sensitive     OXACILLIN <=0.25 SENSITIVE Sensitive     TETRACYCLINE <=1 SENSITIVE Sensitive     VANCOMYCIN <=0.5 SENSITIVE Sensitive     TRIMETH/SULFA <=10 SENSITIVE Sensitive     CLINDAMYCIN <=0.25 SENSITIVE Sensitive     RIFAMPIN <=0.5 SENSITIVE Sensitive     Inducible Clindamycin NEGATIVE Sensitive     * ABUNDANT STAPHYLOCOCCUS AUREUS  MRSA PCR Screening     Status: None   Collection Time: 09/15/20  3:22 AM   Specimen: Nasal Mucosa; Nasopharyngeal  Result Value Ref Range Status   MRSA by PCR NEGATIVE NEGATIVE Final    Comment:        The GeneXpert MRSA Assay (FDA approved for NASAL specimens only), is one component of a comprehensive MRSA colonization surveillance program. It is not intended to diagnose MRSA infection nor to guide or monitor treatment for MRSA infections. Performed at [facility], [place] [address], [place], [place] [address]      RN Pressure Injury Documentation:     Estimated body mass index is 30.94 kg/m as calculated from the  following:   Height as of this encounter: _0  (1.778 m).   Weight as of this encounter: 97.8 kg.  Malnutrition Type:  Nutrition Problem: Increased nutrient needs Etiology: chronic illness  Malnutrition Characteristics:  Signs/Symptoms: estimated needs  Nutrition Interventions:  Interventions: Ensure Enlive (each supplement provides 350kcal and 20 grams of protein),MVI   Radiology Studies: CT ABDOMEN PELVIS W CONTRAST  Result Date: 09/18/2020 CLINICAL DATA:  f ollow up drain placement in Abd/pelvis and left femur EXAM: CT ABDOMEN AND PELVIS WITH CONTRAST TECHNIQUE: Multidetector CT imaging of the abdomen and pelvis was performed using the standard protocol following bolus administration of intravenous contrast. CONTRAST:  159m OMNIPAQUE IOHEXOL 300 MG/ML  SOLN COMPARISON:  CT abdomen pelvis 09/13/2020 FINDINGS: Lower chest: Bilateral lower lobe subsegmental atelectasis. 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 Slightly heterogeneous appearance of the hepatic parenchyma. No definite focal hepatic lesion; however, limited evaluation on this single-phase venous contrast study. No CT findings of calcified gallstones. No gallbladder wall thickening. No biliary ductal dilatation. Pancreas: No focal lesion. Normal pancreatic contour. No surrounding inflammatory changes. No main pancreatic ductal dilatation. Spleen: Normal in size without focal abnormality. Adrenals/Urinary Tract: No adrenal nodule bilaterally. Bilateral kidneys enhance symmetrically. No hydronephrosis. No hydroureter. Foci of gas within the urinary bladder lumen likely related to recent instrumentation. Otherwise the urinary bladder is unremarkable. Stomach/Bowel: Stomach is within normal limits. No evidence of bowel wall thickening or dilatation. The appendix not definitely identified. Vascular/Lymphatic: The portal, splenic, superior mesenteric veins are patent. No abdominal aorta or iliac  aneurysm. Multiple prominent but nonenlarged asymmetric left inguinal lymph nodes. Similar finding of the left external iliac lymph nodes. No abdominal, pelvic, or inguinal lymphadenopathy. Reproductive: Prostate is unremarkable. Scrotal subcutaneus soft tissue edema again noted, likely slightly worsened. Other: Interval increase in small to moderate volume simple free fluid ascites. No free intraperitoneal gas. Musculoskeletal: Interval placement of a left inguinal approach surgical drain with pigtail terminating within a fluid collection associated with the left psoas muscle. The fluid collections are noted to be septated with peripheral enhancement. There  has been interval decrease in size of the iliopsoas fluid collections with the largest component of the psoas muscle measuring up to 4.3 x 1.3 cm (from 6.5 x 5.6 cm and inferiorly the largest iliacus portion that appears to be septated measuring approximately 5.8 x 4.4 cm (from 8.3 x 6.7 cm). Redemonstration of proximal left thigh extensive subcutaneus soft tissue edema as well as several foci of emphysema. Associated multiple fluid collections that are peripherally enhancing. Status post interval surgical drain placement that is partially visualized. Please see separately dictated CT left femur 09/13/2020 for further details. Persistent subcutaneus soft tissue edema that is increased compared to prior. Interval placement of skin staples overlying the left hip. Status post interval partially visualized left total hip arthroplasty. IMPRESSION: 1. Interval decrease in size of multiple multiseptated iliopsoas abscesses status post interval surgical drain placement with pigtail terminating within a fluid collection associated with the left psoas muscle. 2. Redemonstration of proximal left thigh extensive subcutaneus soft tissue edema as well as several foci of emphysema. Associated left lower extremity multiple abscesses status post partially visualized interval  drain catheter. Status post interval partially visualized left total hip arthroplasty. Please see separately dictated CT left femur 09/18/2020 for further details. 3. Foci of gas within the urinary bladder lumen. Correlate with recent instrumentation. 4. Interval increase in small to moderate volume simple free fluid ascites. 5. Interval increase in diffuse subcutaneus soft tissue edema. 6. Cirrhotic morphology of the liver. Markedly limited evaluation for focal hepatic lesion on this single phase venous contrast study. Recommend non-emergent MRI hepatic liver protocol for further evaluation. 7. Please see separately dictated CT left femur 09/18/20 further details. Electronically Signed   By: Iven Finn M.D.   On: 09/18/2020 17:14   CT FEMUR LEFT W CONTRAST  Result Date: 09/18/2020 CLINICAL DATA:  Follow up left thigh abscesses following percutaneous drain placement 4 days ago. EXAM: CT OF THE LOWER LEFT EXTREMITY WITH CONTRAST TECHNIQUE: Multidetector CT imaging of the left thigh was performed according to the standard protocol following intravenous contrast administration. CONTRAST:  153m OMNIPAQUE IOHEXOL 300 MG/ML  SOLN COMPARISON:  Pelvic CT 09/13/2020. Images during drainage procedure 09/14/2020. FINDINGS: Bones/Joint/Cartilage Interval left total hip arthroplasty. The hardware appears well positioned. No evidence of acute fracture or dislocation. There is no bone destruction. Stable mild degenerative changes of the left sacroiliac joint. No large hip joint effusion. Ligaments Suboptimally assessed by CT. Muscles and Tendons Percutaneous drain within the left psoas muscle is unchanged in position. There is no recurrent fluid collection in the psoas muscle, although there is a residual peripherally enhancing iliacus fluid collection measuring 2.4 cm transverse on image 52/5. Peripherally enhancing fluid tracks inferiorly along the iliopsoas tendon into the anterior aspect of the left thigh. There are 2  percutaneous drains within the proximal to mid left thigh. The more lateral drain is situated within a complex fluid collection within the vastus lateralis muscle which measures up to 5.3 x 4.8 cm at the level of the drain. This collection is complex with peripheral enhancement and multiple septations. It extends into the distal thigh, at least 21 cm in length on sagittal image 165/9. Some air within the collection is attributed to the drain. The more posteromedial drain is located within a complex fluid collection involving the proximal sartorius muscle. At the level of the drain, this measures approximately 11.0 x 4.1 cm on image 203/5. This fluid collection is also complex with peripheral enhancement. It may communicate with a separate peripherally enhancing  fluid collection involving the right gracilis muscle. These medial collections are also quite extensive in length, extending beyond the knee, at least 40 cm in overall length (image 125/9). Based on the images obtained through the left thigh after catheter placement on 09/14/2020, the proximal component of the anteromedial collection has mildly enlarged. Soft tissues There is generalized subcutaneous edema throughout the left thigh. No unexpected foreign body. Pelvic findings are dictated separately. IMPRESSION: 1. Interval placement of left pelvic and two left thigh drains for multiple abscesses. The pelvic components of the fluid collections have mildly improved, but there are multiple persistent large peripherally enhancing complex fluid collections within the left thigh which are incompletely drained. These are quite extensive in length, extending into the distal thigh. The proximal component of the anteromedial collection has slightly enlarged. 2. No evidence of osteomyelitis or large hip joint effusion. 3. Interval left total hip arthroplasty. The hardware appears well positioned. Electronically Signed   By: Richardean Sale M.D.   On: 09/18/2020 17:38    ECHO TEE  Result Date: 09/19/2020    TRANSESOPHOGEAL ECHO REPORT   Patient Name:   [name] Date of Exam: 09/19/2020 Medical Rec #:  [id number]         Height:       70.0 in Accession #:    [id number]        Weight:       215.6 lb Date of Birth:  Jun 09, 1986         BSA:          2.155 m Patient Age:    67 years          BP:           106/66 mmHg Patient Gender: M                 HR:           104 bpm. Exam Location:  Inpatient Procedure: Transesophageal Echo, Color Doppler and Saline Contrast Bubble Study Indications:     Bacteremia  History:         Patient has prior history of Echocardiogram examinations, most                  recent 09/14/2020. Signs/Symptoms:Shortness of Breath; Risk                  Factors:Hypertension. ETOH.  Sonographer:     Clayton Lefort RDCS (AE) Referring Phys:  [place] Diagnosing Phys: Dorris Carnes MD PROCEDURE: After discussion of the risks and benefits of a TEE, an informed consent was obtained from the patient. The transesophogeal probe was passed without difficulty through the esophogus of the patient. Local oropharyngeal anesthetic was provided with Cetacaine. Sedation performed by different physician. The patient was monitored while under deep sedation. Anesthestetic sedation was provided intravenously by Anesthesiology: 227.14m of Propofol. Image quality was good. The patient developed no complications during the procedure. IMPRESSIONS  1. No obvious vegetations.  2. Left ventricular ejection fraction, by estimation, is 60 to 65%. The left ventricle has normal function.  3. Right ventricular systolic function is normal. The right ventricular size is normal.  4. No left atrial/left atrial appendage thrombus was detected.  5. The mitral valve is normal in structure. Trivial mitral valve regurgitation.  6. The aortic valve is normal in structure. Aortic valve regurgitation is not visualized.  7. Agitated saline contrast bubble study was negative, with no evidence  of any interatrial shunt.  FINDINGS  Left Ventricle: Left ventricular ejection fraction, by estimation, is 60 to 65%. The left ventricle has normal function. The left ventricular internal cavity size was normal in size. Right Ventricle: The right ventricular size is normal. Right ventricular systolic function is normal. Left Atrium: Left atrial size was normal in size. No left atrial/left atrial appendage thrombus was detected. Right Atrium: Right atrial size was normal in size. Pericardium: There is no evidence of pericardial effusion. Mitral Valve: The mitral valve is normal in structure. Trivial mitral valve regurgitation. Tricuspid Valve: The tricuspid valve is normal in structure. Tricuspid valve regurgitation is trivial. Aortic Valve: The aortic valve is normal in structure. Aortic valve regurgitation is not visualized. Pulmonic Valve: The pulmonic valve was normal in structure. Pulmonic valve regurgitation is not visualized. Aorta: The aortic root and ascending aorta are structurally normal, with no evidence of dilitation. IAS/Shunts: No atrial level shunt detected by color flow Doppler. Agitated saline contrast was given intravenously to evaluate for intracardiac shunting. Agitated saline contrast bubble study was negative, with no evidence of any interatrial shunt. Dorris Carnes MD Electronically signed by Dorris Carnes MD Signature Date/Time: 09/19/2020/4:02:21 PM    Final    Scheduled Meds: . carvedilol  3.125 mg Oral BID WC  . Chlorhexidine Gluconate Cloth  6 each Topical Daily  . docusate sodium  100 mg Oral BID  . folic acid  1 mg Oral Daily  . furosemide  40 mg Intravenous Once  . lactulose  10 g Oral BID  . mouth rinse  15 mL Mouth Rinse BID  . metroNIDAZOLE  500 mg Oral Q8H  . multivitamin with minerals  1 tablet Oral Daily  . pantoprazole  40 mg Oral BID  . sertraline  25 mg Oral Daily  . sodium chloride flush  5 mL Intracatheter Q8H  . thiamine  100 mg Oral Daily   Continuous  Infusions: .  ceFAZolin (ANCEF) IV Stopped (09/19/20 1343)    LOS: 6 days   Kerney Elbe, DO [facility] PAGER is on [place]  If 7PM-7AM, please contact night-coverage [email or web address]

## 2020-09-19 NOTE — CV Procedure (Addendum)
TEE  Patient anesthetized by anesthesia with Propofol intravenously. Throat numbed with Cetacaine spray Bite guard placed TEE probe advanced to mid esophagus without difficulty  LA, LAA without masses  TV normal  Trace TR AV normal  No AI MV normal  Trace MR PV normal  LVEF and RVEF are normal  No PFO by color doppler or with injection x 2 of agitated saline   Aorta is normal.     Full report to follow . Procedure was without complications .  Dietrich Pates MD

## 2020-09-19 NOTE — Progress Notes (Signed)
  Echocardiogram Echocardiogram Transesophageal has been performed.  Gerda Diss 09/19/2020, 9:52 AM

## 2020-09-19 NOTE — Anesthesia Postprocedure Evaluation (Signed)
Anesthesia Post Note  Patient: [name]  Procedure(s) Performed: TRANSESOPHAGEAL ECHOCARDIOGRAM (TEE) (N/A ) BUBBLE STUDY     Patient location during evaluation: PACU Anesthesia Type: MAC Level of consciousness: awake and alert Pain management: pain level controlled Vital Signs Assessment: post-procedure vital signs reviewed and stable Respiratory status: spontaneous breathing, nonlabored ventilation, respiratory function stable and patient connected to nasal cannula oxygen Cardiovascular status: stable and blood pressure returned to baseline Postop Assessment: no apparent nausea or vomiting Anesthetic complications: no   No complications documented.  Last Vitals:  Vitals:   09/19/20 1000 09/19/20 1100  BP: 120/60 124/66  Pulse: 98 (!) 103  Resp: 15 18  Temp:    SpO2: 96% 99%    Last Pain:  Vitals:   09/19/20 1221  TempSrc:   PainSc: 3                  Tiajuana Amass

## 2020-09-20 ENCOUNTER — Encounter (HOSPITAL_COMMUNITY)
Admission: EM | Disposition: A | Discharge status: Rehabilitation Facility | Payer: Self-pay | Source: Home / Self Care | Attending: Internal Medicine

## 2020-09-20 ENCOUNTER — Inpatient Hospital Stay (HOSPITAL_COMMUNITY): Payer: 59 | Admitting: Anesthesiology

## 2020-09-20 ENCOUNTER — Encounter (HOSPITAL_COMMUNITY): Payer: Self-pay | Admitting: Internal Medicine

## 2020-09-20 DIAGNOSIS — B9561 Methicillin susceptible Staphylococcus aureus infection as the cause of diseases classified elsewhere: Secondary | ICD-10-CM | POA: Diagnosis not present

## 2020-09-20 DIAGNOSIS — K7010 Alcoholic hepatitis without ascites: Secondary | ICD-10-CM | POA: Diagnosis not present

## 2020-09-20 DIAGNOSIS — R7881 Bacteremia: Secondary | ICD-10-CM | POA: Diagnosis not present

## 2020-09-20 DIAGNOSIS — L02416 Cutaneous abscess of left lower limb: Secondary | ICD-10-CM | POA: Diagnosis not present

## 2020-09-20 DIAGNOSIS — K6812 Psoas muscle abscess: Secondary | ICD-10-CM | POA: Diagnosis not present

## 2020-09-20 DIAGNOSIS — K7030 Alcoholic cirrhosis of liver without ascites: Secondary | ICD-10-CM | POA: Diagnosis not present

## 2020-09-20 HISTORY — PX: I & D EXTREMITY: SHX5045

## 2020-09-20 LAB — COMPREHENSIVE METABOLIC PANEL
ALT: 13 U/L (ref 0–44)
AST: 30 U/L (ref 15–41)
Albumin: 1.9 g/dL — ABNORMAL LOW (ref 3.5–5.0)
Alkaline Phosphatase: 240 U/L — ABNORMAL HIGH (ref 38–126)
Anion gap: 13 (ref 5–15)
BUN: 13 mg/dL (ref 6–20)
CO2: 23 mmol/L (ref 22–32)
Calcium: 7.4 mg/dL — ABNORMAL LOW (ref 8.9–10.3)
Chloride: 95 mmol/L — ABNORMAL LOW (ref 98–111)
Creatinine, Ser: 0.43 mg/dL — ABNORMAL LOW (ref 0.61–1.24)
GFR, Estimated: >60 mL/min (ref >60)
Glucose, Bld: 96 mg/dL (ref 70–99)
Potassium: 4.3 mmol/L (ref 3.5–5.1)
Sodium: 131 mmol/L — ABNORMAL LOW (ref 135–145)
Total Bilirubin: 1.6 mg/dL — ABNORMAL HIGH (ref 0.3–1.2)
Total Protein: 5.2 g/dL — ABNORMAL LOW (ref 6.5–8.1)

## 2020-09-20 LAB — CBC WITH DIFFERENTIAL/PLATELET
Abs Immature Granulocytes: 0.74 10*3/uL — ABNORMAL HIGH (ref 0.00–0.07)
Basophils Absolute: 0.1 10*3/uL (ref 0.0–0.1)
Basophils Relative: 1 %
Eosinophils Absolute: 0.1 10*3/uL (ref 0.0–0.5)
Eosinophils Relative: 1 %
HCT: 24 % — ABNORMAL LOW (ref 39.0–52.0)
Hemoglobin: 7.7 g/dL — ABNORMAL LOW (ref 13.0–17.0)
Immature Granulocytes: 7 %
Lymphocytes Relative: 11 %
Lymphs Abs: 1.2 10*3/uL (ref 0.7–4.0)
MCH: 32.1 pg (ref 26.0–34.0)
MCHC: 32.1 g/dL (ref 30.0–36.0)
MCV: 100 fL (ref 80.0–100.0)
Monocytes Absolute: 0.3 10*3/uL (ref 0.1–1.0)
Monocytes Relative: 3 %
Neutro Abs: 8.3 10*3/uL — ABNORMAL HIGH (ref 1.7–7.7)
Neutrophils Relative %: 77 %
Platelets: 99 10*3/uL — ABNORMAL LOW (ref 150–400)
RBC: 2.4 MIL/uL — ABNORMAL LOW (ref 4.22–5.81)
RDW: 17.2 % — ABNORMAL HIGH (ref 11.5–15.5)
WBC: 10.7 10*3/uL — ABNORMAL HIGH (ref 4.0–10.5)
nRBC: 0.2 % (ref 0.0–0.2)

## 2020-09-20 LAB — PROCALCITONIN: Procalcitonin: 2.02 ng/mL

## 2020-09-20 LAB — PHOSPHORUS: Phosphorus: 2.7 mg/dL (ref 2.5–4.6)

## 2020-09-20 LAB — MAGNESIUM: Magnesium: 1.9 mg/dL (ref 1.7–2.4)

## 2020-09-20 SURGERY — IRRIGATION AND DEBRIDEMENT EXTREMITY
Anesthesia: General | Laterality: Left

## 2020-09-20 MED ORDER — FENTANYL CITRATE (PF) 100 MCG/2ML IJ SOLN
INTRAMUSCULAR | Status: AC
  Filled 2020-09-20: qty 2

## 2020-09-20 MED ORDER — CHLORHEXIDINE GLUCONATE 0.12 % MT SOLN
15.0000 mL | Freq: Once | OROMUCOSAL | Status: AC
  Administered 2020-09-20: 15 mL via OROMUCOSAL

## 2020-09-20 MED ORDER — PROPOFOL 10 MG/ML IV BOLUS
INTRAVENOUS | Status: DC | PRN
  Administered 2020-09-20: 50 mg via INTRAVENOUS
  Administered 2020-09-20: 150 mg via INTRAVENOUS

## 2020-09-20 MED ORDER — MIDAZOLAM HCL 5 MG/5ML IJ SOLN
INTRAMUSCULAR | Status: DC | PRN
  Administered 2020-09-20: 2 mg via INTRAVENOUS

## 2020-09-20 MED ORDER — BUPIVACAINE HCL 0.25 % IJ SOLN
INTRAMUSCULAR | Status: AC
  Filled 2020-09-20: qty 1

## 2020-09-20 MED ORDER — MIDAZOLAM HCL 2 MG/2ML IJ SOLN
INTRAMUSCULAR | Status: AC
  Filled 2020-09-20: qty 2

## 2020-09-20 MED ORDER — DEXAMETHASONE SODIUM PHOSPHATE 10 MG/ML IJ SOLN
INTRAMUSCULAR | Status: DC | PRN
  Administered 2020-09-20: 5 mg via INTRAVENOUS

## 2020-09-20 MED ORDER — LIDOCAINE HCL (PF) 2 % IJ SOLN
INTRAMUSCULAR | Status: AC
  Filled 2020-09-20: qty 5

## 2020-09-20 MED ORDER — ACETAMINOPHEN 500 MG PO TABS
1000.0000 mg | ORAL_TABLET | Freq: Once | ORAL | Status: AC
  Administered 2020-09-20: 1000 mg via ORAL
  Filled 2020-09-20: qty 2

## 2020-09-20 MED ORDER — POVIDONE-IODINE 10 % EX SWAB
2.0000 "application " | Freq: Once | CUTANEOUS | Status: AC
  Administered 2020-09-20: 2 via TOPICAL

## 2020-09-20 MED ORDER — LACTATED RINGERS IV SOLN: INTRAVENOUS | Status: DC

## 2020-09-20 MED ORDER — SUCCINYLCHOLINE CHLORIDE 200 MG/10ML IV SOSY
PREFILLED_SYRINGE | INTRAVENOUS | Status: DC | PRN
  Administered 2020-09-20: 140 mg via INTRAVENOUS

## 2020-09-20 MED ORDER — PROPOFOL 10 MG/ML IV BOLUS
INTRAVENOUS | Status: AC
  Filled 2020-09-20: qty 20

## 2020-09-20 MED ORDER — SODIUM CHLORIDE 0.9 % IR SOLN
Status: DC | PRN
  Administered 2020-09-20 (×2): 3000 mL

## 2020-09-20 MED ORDER — ROCURONIUM BROMIDE 10 MG/ML (PF) SYRINGE
PREFILLED_SYRINGE | INTRAVENOUS | Status: AC
  Filled 2020-09-20: qty 10

## 2020-09-20 MED ORDER — ONDANSETRON HCL 4 MG/2ML IJ SOLN
INTRAMUSCULAR | Status: DC | PRN
  Administered 2020-09-20: 4 mg via INTRAVENOUS

## 2020-09-20 MED ORDER — HYDROMORPHONE HCL 1 MG/ML IJ SOLN
INTRAMUSCULAR | Status: AC
  Filled 2020-09-20: qty 1

## 2020-09-20 MED ORDER — FENTANYL CITRATE (PF) 250 MCG/5ML IJ SOLN
INTRAMUSCULAR | Status: AC
  Filled 2020-09-20: qty 5

## 2020-09-20 MED ORDER — ONDANSETRON HCL 4 MG/2ML IJ SOLN
INTRAMUSCULAR | Status: AC
  Filled 2020-09-20: qty 2

## 2020-09-20 MED ORDER — HYDROMORPHONE HCL 1 MG/ML IJ SOLN
1.0000 mg | INTRAMUSCULAR | Status: AC | PRN
  Administered 2020-09-20 – 2020-09-22 (×3): 1 mg via INTRAVENOUS
  Filled 2020-09-20 (×3): qty 1

## 2020-09-20 MED ORDER — LIDOCAINE 2% (20 MG/ML) 5 ML SYRINGE
INTRAMUSCULAR | Status: DC | PRN
  Administered 2020-09-20: 60 mg via INTRAVENOUS

## 2020-09-20 MED ORDER — FENTANYL CITRATE (PF) 250 MCG/5ML IJ SOLN
INTRAMUSCULAR | Status: DC | PRN
  Administered 2020-09-20 (×5): 50 ug via INTRAVENOUS
  Administered 2020-09-20: 100 ug via INTRAVENOUS

## 2020-09-20 SURGICAL SUPPLY — 42 items
BNDG CMPR 9X4 STRL LF SNTH (GAUZE/BANDAGES/DRESSINGS) ×1
BNDG COHESIVE 4X5 TAN STRL (GAUZE/BANDAGES/DRESSINGS) ×2 IMPLANT
BNDG ELASTIC 4X5.8 VLCR STR LF (GAUZE/BANDAGES/DRESSINGS) ×2 IMPLANT
BNDG ESMARK 4X9 LF (GAUZE/BANDAGES/DRESSINGS) ×2 IMPLANT
BOOTIES KNEE HIGH SLOAN (MISCELLANEOUS) ×4 IMPLANT
CANISTER WOUND CARE 500ML ATS (WOUND CARE) ×1 IMPLANT
COVER SURGICAL LIGHT HANDLE (MISCELLANEOUS) ×2 IMPLANT
COVER WAND RF STERILE (DRAPES) IMPLANT
DRAPE POUCH INSTRU U-SHP 10X18 (DRAPES) ×2 IMPLANT
DRSG MEPITEL 8X12 (GAUZE/BANDAGES/DRESSINGS) ×1 IMPLANT
DRSG PAD ABDOMINAL 8X10 ST (GAUZE/BANDAGES/DRESSINGS) ×4 IMPLANT
DRSG VAC ATS MED SENSATRAC (GAUZE/BANDAGES/DRESSINGS) ×1 IMPLANT
DURAPREP 26ML APPLICATOR (WOUND CARE) ×1 IMPLANT
ELECT REM PT RETURN 15FT ADLT (MISCELLANEOUS) ×2 IMPLANT
GAUZE SPONGE 4X4 12PLY STRL (GAUZE/BANDAGES/DRESSINGS) ×3 IMPLANT
GAUZE XEROFORM 1X8 LF (GAUZE/BANDAGES/DRESSINGS) ×2 IMPLANT
GLOVE SRG 8 PF TXTR STRL LF DI (GLOVE) ×1 IMPLANT
GLOVE SURG ENC MOIS LTX SZ7 (GLOVE) ×2 IMPLANT
GLOVE SURG SS PI 7.5 STRL IVOR (GLOVE) ×2 IMPLANT
GLOVE SURG UNDER POLY LF SZ7 (GLOVE) ×2 IMPLANT
GLOVE SURG UNDER POLY LF SZ8 (GLOVE) ×2
GOWN STRL REUS W/TWL LRG LVL3 (GOWN DISPOSABLE) ×4 IMPLANT
IV NS IRRIG 3000ML ARTHROMATIC (IV SOLUTION) ×4 IMPLANT
KIT BASIN OR (CUSTOM PROCEDURE TRAY) ×2 IMPLANT
KIT TURNOVER KIT A (KITS) ×2 IMPLANT
MANIFOLD NEPTUNE II (INSTRUMENTS) ×2 IMPLANT
NS IRRIG 1000ML POUR BTL (IV SOLUTION) ×2 IMPLANT
PACK ORTHO EXTREMITY (CUSTOM PROCEDURE TRAY) ×2 IMPLANT
PAD CAST 4YDX4 CTTN HI CHSV (CAST SUPPLIES) ×2 IMPLANT
PADDING CAST COTTON 4X4 STRL (CAST SUPPLIES) ×4
PENCIL SMOKE EVACUATOR (MISCELLANEOUS) IMPLANT
PROTECTOR NERVE ULNAR (MISCELLANEOUS) ×2 IMPLANT
SET CYSTO W/LG BORE CLAMP LF (SET/KITS/TRAYS/PACK) ×1 IMPLANT
SET IRRIG Y TYPE TUR BLADDER L (SET/KITS/TRAYS/PACK) ×2 IMPLANT
SPONGE DRAIN TRACH 4X4 STRL 2S (GAUZE/BANDAGES/DRESSINGS) ×3 IMPLANT
SUT NYLON 3 0 (SUTURE) ×4 IMPLANT
SWAB COLLECTION DEVICE MRSA (MISCELLANEOUS) IMPLANT
SWAB CULTURE ESWAB REG 1ML (MISCELLANEOUS) IMPLANT
TAPE CLOTH SURG 6X10 WHT LF (GAUZE/BANDAGES/DRESSINGS) ×1 IMPLANT
TOWEL OR 17X26 10 PK STRL BLUE (TOWEL DISPOSABLE) ×2 IMPLANT
TRAY PREP A LATEX SAFE STRL (SET/KITS/TRAYS/PACK) ×1 IMPLANT
YANKAUER SUCT BULB TIP 10FT TU (MISCELLANEOUS) ×1 IMPLANT

## 2020-09-20 NOTE — Anesthesia Postprocedure Evaluation (Signed)
Anesthesia Post Note  Patient: [name]  Procedure(s) Performed: IRRIGATION AND DEBRIDEMENT EXTREMITY LEFT LEG ABCESS (Left )     Patient location during evaluation: PACU Anesthesia Type: General Level of consciousness: awake and alert Pain management: pain level controlled Vital Signs Assessment: post-procedure vital signs reviewed and stable Respiratory status: spontaneous breathing, nonlabored ventilation, respiratory function stable and patient connected to nasal cannula oxygen Cardiovascular status: blood pressure returned to baseline and stable Postop Assessment: no apparent nausea or vomiting Anesthetic complications: no   No complications documented.  Last Vitals:  Vitals:   09/20/20 1300 09/20/20 1400  BP: 120/67 125/64  Pulse: (!) 106 (!) 103  Resp: 18 17  Temp:    SpO2: 98% 95%    Last Pain:  Vitals:   09/20/20 1217  TempSrc:   PainSc: 6                  [place] Silveria S

## 2020-09-20 NOTE — Progress Notes (Signed)
[facility]   Reason for visit: Follow up on disseminated MSSA infection  Interval History: plan for operative debridement today by Dr Sena Slate.  Remains afebrile, WBC down to 10.7.  Continues to have pain.  Day 8 cefazolin Day 3 metronidazole   Physical Exam: Constitutional:  Vitals:   09/20/20 1300 09/20/20 1400  BP: 120/67 125/64  Pulse: (!) 106 (!) 103  Resp: 18 17  Temp:    SpO2: 98% 95%  he appears in nad Respiratory: normal respiratory effort, CTA B Cardiovascular:RRR  Review of Systems: Constitutional: negative for fever and chills Integument/breast: negative for rash  Lab Results  Component Value Date   WBC 10.7 (H) 09/20/2020   HGB 7.7 (L) 09/20/2020   HCT 24.0 (L) 09/20/2020   MCV 100.0 09/20/2020   PLT 99 (L) 09/20/2020    Lab Results  Component Value Date   CREATININE 0.43 (L) 09/20/2020   BUN 13 09/20/2020   NA 131 (L) 09/20/2020   K 4.3 09/20/2020   CL 95 (L) 09/20/2020   CO2 23 09/20/2020    Lab Results  Component Value Date   ALT 13 09/20/2020   AST 30 09/20/2020   ALKPHOS 240 (H) 09/20/2020     Microbiology: Recent Results (from the past 240 hour(s))  Culture, blood (routine x 2)     Status: Abnormal   Collection Time: 09/13/20 10:45 AM   Specimen: BLOOD  Result Value Ref Range Status   Specimen Description   Final    BLOOD RIGHT ANTECUBITAL Performed at [facility], [address] [place], [place] [address]    Special Requests   Final    BOTTLES DRAWN AEROBIC AND ANAEROBIC Blood Culture adequate volume Performed at [facility], [address], [place], [place] [address]    Culture  Setup Time   Final    IN BOTH AEROBIC AND ANAEROBIC BOTTLES GRAM POSITIVE COCCI CRITICAL RESULT CALLED TO, READ BACK BY AND VERIFIED WITHErling Cruz [facility] 09/13/20 0347 JDW Performed at [facility], [address], [place], [place] [address]    Culture STAPHYLOCOCCUS AUREUS  (A)  Final   Report Status 09/16/2020 FINAL  Final   Organism ID, Bacteria STAPHYLOCOCCUS AUREUS  Final      Susceptibility   Staphylococcus aureus - MIC*    CIPROFLOXACIN <=0.5 SENSITIVE Sensitive     ERYTHROMYCIN <=0.25 SENSITIVE Sensitive     GENTAMICIN <=0.5 SENSITIVE Sensitive     OXACILLIN <=0.25 SENSITIVE Sensitive     TETRACYCLINE <=1 SENSITIVE Sensitive     VANCOMYCIN <=0.5 SENSITIVE Sensitive     TRIMETH/SULFA <=10 SENSITIVE Sensitive     CLINDAMYCIN <=0.25 SENSITIVE Sensitive     RIFAMPIN <=0.5 SENSITIVE Sensitive     Inducible Clindamycin NEGATIVE Sensitive     * STAPHYLOCOCCUS AUREUS  Blood Culture ID Panel (Reflexed)     Status: Abnormal   Collection Time: 09/13/20 10:45 AM  Result Value Ref Range Status   Enterococcus faecalis NOT DETECTED NOT DETECTED Final   Enterococcus Faecium NOT DETECTED NOT DETECTED Final   Listeria monocytogenes NOT DETECTED NOT DETECTED Final   Staphylococcus species DETECTED (A) NOT DETECTED Final    Comment: CRITICAL RESULT CALLED TO, READ BACK BY AND VERIFIED WITH: E JACKSON PHARMD 09/13/20 0347 JDW    Staphylococcus aureus (BCID) DETECTED (A) NOT DETECTED Final    Comment: CRITICAL RESULT CALLED TO, READ BACK BY AND VERIFIED WITH: E JACKSON PHARMD 09/13/20 0347 JDW  Staphylococcus epidermidis NOT DETECTED NOT DETECTED Final   Staphylococcus lugdunensis NOT DETECTED NOT DETECTED Final   Streptococcus species NOT DETECTED NOT DETECTED Final   Streptococcus agalactiae NOT DETECTED NOT DETECTED Final   Streptococcus pneumoniae NOT DETECTED NOT DETECTED Final   Streptococcus pyogenes NOT DETECTED NOT DETECTED Final   A.calcoaceticus-baumannii NOT DETECTED NOT DETECTED Final   Bacteroides fragilis NOT DETECTED NOT DETECTED Final   Enterobacterales NOT DETECTED NOT DETECTED Final   Enterobacter cloacae complex NOT DETECTED NOT DETECTED Final   Escherichia coli NOT DETECTED NOT DETECTED Final   Klebsiella aerogenes NOT DETECTED NOT  DETECTED Final   Klebsiella oxytoca NOT DETECTED NOT DETECTED Final   Klebsiella pneumoniae NOT DETECTED NOT DETECTED Final   Proteus species NOT DETECTED NOT DETECTED Final   Salmonella species NOT DETECTED NOT DETECTED Final   Serratia marcescens NOT DETECTED NOT DETECTED Final   Haemophilus influenzae NOT DETECTED NOT DETECTED Final   Neisseria meningitidis NOT DETECTED NOT DETECTED Final   Pseudomonas aeruginosa NOT DETECTED NOT DETECTED Final   Stenotrophomonas maltophilia NOT DETECTED NOT DETECTED Final   Candida albicans NOT DETECTED NOT DETECTED Final   Candida auris NOT DETECTED NOT DETECTED Final   Candida glabrata NOT DETECTED NOT DETECTED Final   Candida krusei NOT DETECTED NOT DETECTED Final   Candida parapsilosis NOT DETECTED NOT DETECTED Final   Candida tropicalis NOT DETECTED NOT DETECTED Final   Cryptococcus neoformans/gattii NOT DETECTED NOT DETECTED Final   Meth resistant mecA/C and MREJ NOT DETECTED NOT DETECTED Final    Comment: Performed at [facility], [address] [place], [place] [address]  Culture, blood (routine x 2)     Status: Abnormal   Collection Time: 09/13/20 11:20 AM   Specimen: BLOOD  Result Value Ref Range Status   Specimen Description   Final    BLOOD RIGHT ANTECUBITAL Performed at [facility], [address] [place], [place] [address]    Special Requests   Final    BOTTLES DRAWN AEROBIC AND ANAEROBIC Blood Culture adequate volume Performed at [facility], [address] [place], [place] [address]    Culture  Setup Time   Final    IN BOTH AEROBIC AND ANAEROBIC BOTTLES GRAM POSITIVE COCCI CRITICAL VALUE NOTED.  VALUE IS CONSISTENT WITH PREVIOUSLY REPORTED AND CALLED VALUE.    Culture (A)  Final    STAPHYLOCOCCUS AUREUS SUSCEPTIBILITIES PERFORMED ON PREVIOUS CULTURE WITHIN THE LAST 5 DAYS. Performed at [facility], [address] [place], [place] [address]    Report Status 09/16/2020  FINAL  Final  Resp Panel by RT-PCR (Flu A&B, Covid) Nasopharyngeal Swab     Status: None   Collection Time: 09/13/20  2:01 PM   Specimen: Nasopharyngeal Swab; Nasopharyngeal(NP) swabs in vial transport medium  Result Value Ref Range Status   SARS Coronavirus 2 by RT PCR NEGATIVE NEGATIVE Final    Comment: (NOTE) SARS-CoV-2 target nucleic acids are NOT DETECTED.  The SARS-CoV-2 RNA is generally detectable in upper respiratory specimens during the acute phase of infection. The lowest concentration of SARS-CoV-2 viral copies this assay can detect is 138 copies/mL. A negative result does not preclude SARS-Cov-2 infection and should not be used as the sole basis for treatment or other patient management decisions. A negative result may occur with  improper specimen collection/handling, submission of specimen other than nasopharyngeal swab, presence of viral mutation(s) within the areas targeted by this assay, and inadequate number of viral copies(<138  copies/mL). A negative result must be combined with clinical observations, patient history, and epidemiological information. The expected result is Negative.  Fact Sheet for Patients:  BloggerCourse.com  Fact Sheet for Healthcare Providers:  SeriousBroker.it  This test is no t yet approved or cleared by the [place] FDA and  has been authorized for detection and/or diagnosis of SARS-CoV-2 by FDA under an Emergency Use Authorization (EUA). This EUA will remain  in effect (meaning this test can be used) for the duration of the COVID-19 declaration under Section 564(b)(1) of the Act, 21 U.S.C.section 360bbb-3(b)(1), unless the authorization is terminated  or revoked sooner.       Influenza A by PCR NEGATIVE NEGATIVE Final   Influenza B by PCR NEGATIVE NEGATIVE Final    Comment: (NOTE) The Xpert Xpress SARS-CoV-2/FLU/RSV plus assay is intended as an aid in the diagnosis of influenza  from Nasopharyngeal swab specimens and should not be used as a sole basis for treatment. Nasal washings and aspirates are unacceptable for Xpert Xpress SARS-CoV-2/FLU/RSV testing.  Fact Sheet for Patients: BloggerCourse.com  Fact Sheet for Healthcare Providers: SeriousBroker.it  This test is not yet approved or cleared by the [place] FDA and has been authorized for detection and/or diagnosis of SARS-CoV-2 by FDA under an Emergency Use Authorization (EUA). This EUA will remain in effect (meaning this test can be used) for the duration of the COVID-19 declaration under Section 564(b)(1) of the Act, 21 U.S.C. section 360bbb-3(b)(1), unless the authorization is terminated or revoked.  Performed at [facility], [address], [place], [place] [address]   Culture, blood (routine x 2)     Status: None   Collection Time: 09/14/20  9:23 AM   Specimen: BLOOD RIGHT HAND  Result Value Ref Range Status   Specimen Description   Final    BLOOD RIGHT HAND Performed at [facility], [address], [place], [place] [address]    Special Requests   Final    BOTTLES DRAWN AEROBIC AND ANAEROBIC Blood Culture adequate volume Performed at [facility], [address] [place], [place] [address]    Culture   Final    NO GROWTH 5 DAYS Performed at [facility], [address] [place], [place] [address]    Report Status 09/19/2020 FINAL  Final  Culture, blood (routine x 2)     Status: None   Collection Time: 09/14/20  9:24 AM   Specimen: BLOOD LEFT HAND  Result Value Ref Range Status   Specimen Description   Final    BLOOD LEFT HAND Performed at [facility], [address], [place], [place] [address]    Special Requests   Final    BOTTLES DRAWN AEROBIC ONLY Blood Culture adequate volume Performed at [facility], [address],  [place], [place] [address]    Culture   Final    NO GROWTH 5 DAYS Performed at [facility], [address] [place], [place] [address]    Report Status 09/19/2020 FINAL  Final  Aerobic/Anaerobic Culture (surgical/deep wound)     Status: None   Collection Time: 09/14/20 12:37 PM   Specimen: Abscess  Result Value Ref Range Status   Specimen Description   Final    ABSCESS  LEFT RP Performed at [facility], [address], [place], [place] [address]    Special Requests   Final    Normal Performed at [name],  [address], [place], [place] [address]    Gram Stain   Final    MODERATE WBC PRESENT, PREDOMINANTLY PMN ABUNDANT GRAM POSITIVE COCCI ABUNDANT GRAM NEGATIVE RODS    Culture   Final    ABUNDANT STAPHYLOCOCCUS AUREUS MODERATE BACTEROIDES ORALIS FEW PREVOTELLA BIVIA BETA LACTAMASE POSITIVE Performed at [facility], [address] [place], [place] [address]    Report Status 09/19/2020 FINAL  Final   Organism ID, Bacteria STAPHYLOCOCCUS AUREUS  Final      Susceptibility   Staphylococcus aureus - MIC*    CIPROFLOXACIN <=0.5 SENSITIVE Sensitive     ERYTHROMYCIN <=0.25 SENSITIVE Sensitive     GENTAMICIN <=0.5 SENSITIVE Sensitive     OXACILLIN <=0.25 SENSITIVE Sensitive     TETRACYCLINE <=1 SENSITIVE Sensitive     VANCOMYCIN <=0.5 SENSITIVE Sensitive     TRIMETH/SULFA <=10 SENSITIVE Sensitive     CLINDAMYCIN <=0.25 SENSITIVE Sensitive     RIFAMPIN <=0.5 SENSITIVE Sensitive     Inducible Clindamycin NEGATIVE Sensitive     * ABUNDANT STAPHYLOCOCCUS AUREUS  MRSA PCR Screening     Status: None   Collection Time: 09/15/20  3:22 AM   Specimen: Nasal Mucosa; Nasopharyngeal  Result Value Ref Range Status   MRSA by PCR NEGATIVE NEGATIVE Final    Comment:        The GeneXpert MRSA Assay (FDA approved for NASAL specimens only), is one component of a comprehensive MRSA colonization surveillance program. It is not intended to  diagnose MRSA infection nor to guide or monitor treatment for MRSA infections. Performed at [facility], [address], [place], [place] [address]     Impression/Plan:  1.  MSSA bacteremia - repeat blood cultures remained negative.  Tolerating cefazolin.  No changes.    2.  Hip/thigh abscesses - plan for operative debridement due to extensive nature of fluid collections.  On antibiotics as above.  Cultures with Staph aureus, Bacteroides, Prevotella.  On metronidazole as well.    3. Alcoholic hepatic cirrhosis - bilirubin remains up though improved compared to previous.  Will continue to monitor.

## 2020-09-20 NOTE — Transfer of Care (Signed)
Immediate Anesthesia Transfer of Care Note  Patient: [name]  Procedure(s) Performed: IRRIGATION AND DEBRIDEMENT EXTREMITY LEFT LEG ABCESS (Left )  Patient Location: PACU  Anesthesia Type:General  Level of Consciousness: awake, alert  and oriented  Airway & Oxygen Therapy: Patient Spontanous Breathing and Patient connected to face mask oxygen  Post-op Assessment: Report given to RN and Post -op Vital signs reviewed and stable  Post vital signs: Reviewed and stable  Last Vitals:  Vitals Value Taken Time  BP    Temp    Pulse 117 09/20/20 1833  Resp 18 09/20/20 1833  SpO2 100 % 09/20/20 1833  Vitals shown include unvalidated device data.  Last Pain:  Vitals:   09/20/20 1217  TempSrc:   PainSc: 6       Patients Stated Pain Goal: 3 (09/15/20 2102)  Complications: No complications documented.

## 2020-09-20 NOTE — Progress Notes (Signed)
I reexamined the patient, neurologically he is intact distally, EHL is present.  He still has significant scrotal swelling.  Still significant swelling around his left thigh.  I have reviewed the CAT scans with interventional radiology and developed a coordinated surgical and interventional radiology effort, which will include repeat exploration of the right thigh today with incision, irrigation, debridement of the medial compartment, I will also adjust the drain that is in the anterior compartment, he has a separate multiloculated area over the anterolateral aspect of the thigh proximally, and it is not clear if this communicates with any of the currently drained areas, however we are going to plan for exploration of the medial compartment, adjustment of the drain anteriorly, and interventional radiology may place an additional drain anteriorly into the anterolateral proximal abscess if this is not decompressed with today's surgical intervention.  He already has a lateral incision, and I am planning to make a fairly large medial incision followed by a wound VAC along the medial aspect of his thigh, hopefully we can manage the anterior abscesses without making an anterior incision as well.  This continues to be a life-threatening and severe infection and hopefully we will ultimately be able to achieve some degree of surgical and medical control.  Teryl Lucy, MD

## 2020-09-20 NOTE — Consult Note (Signed)
I have been asked to see the patient by Dr. Dorthula NettlesJosh Landau, for evaluation and management of scrotal swelling.  History of present illness: Unfortunate 35 year old male admitted to the hospital with MSSA bacteremia and a septic left leg with a past medical history of alcoholic cirrhosis.  He presented to the emergency department approximately 2 weeks ago worsening left leg and thigh pain.  He also was having unusual bleeding from his nose and his eyes.  He was found to have a hemoglobin of 6.2 and a CT scan demonstrated a left iliopsoas and left gluteal abscess with diffuse swelling and left hip effusion.  The patient has been on IV antibiotics and then to the operating room several times as well as had several drains placed by interventional radiology.  He seems to be improving for the most part, but has developed some scrotal swelling and penile edema.  CT scan was performed on 09/18/2020 which demonstrated some air within the bladder, likely instrument related, and no significant subcutaneous air or concern for infection within his scrotum.  The patient is complaining of some mild scrotal pain with manipulation.  He is also stating that he is having a difficult time with urination, especially being supine in bed.  Review of systems: A 12 point comprehensive review of systems was obtained and is negative unless otherwise stated in the history of present illness.  Patient Active Problem List   Diagnosis Date Noted  . Septic arthritis (HCC) 09/14/2020  . MSSA bacteremia 09/14/2020  . 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 Sepsis (HCC) 09/13/2020  . ARF (acute renal failure) (HCC) 08/22/2020  . Thrombocytopenia (HCC) 08/22/2020  . Alcoholic hepatitis 08/22/2020  . Hypertension 08/22/2020  . COVID-19 virus infection 08/22/2020  . Alcohol abuse   . MDD (major depressive disorder) 10/15/2013  . Alcohol dependence (HCC) 04/02/2013  . Social anxiety disorder 04/02/2013  . ALLERGIC RHINITIS  03/28/2007    No current facility-administered medications on file prior to encounter.   Current Outpatient Medications on File Prior to Encounter  Medication Sig Dispense Refill  . carvedilol (COREG) 3.125 MG tablet Take 3.125 mg by mouth 2 (two) times daily.    . folic acid (FOLVITE) 1 MG tablet Take 1 tablet (1 mg total) by mouth daily. 100 tablet 0  . guaiFENesin-dextromethorphan (ROBITUSSIN DM) 100-10 MG/5ML syrup Take 5 mLs by mouth every 4 (four) hours as needed for cough (chest congestion). (Patient taking differently: Take 10 mLs by mouth every 4 (four) hours as needed for cough (chest congestion).) 118 mL 0  . lactulose (CHRONULAC) 10 GM/15ML solution Take 15 mLs (10 g total) by mouth 2 (two) times daily. 236 mL 0  . magnesium oxide (MAG-OX) 400 MG tablet Take 1 tablet by mouth daily.    . pantoprazole (PROTONIX) 40 MG tablet Take 1 tablet (40 mg total) by mouth 2 (two) times daily before a meal for 28 days, THEN 1 tablet (40 mg total) daily for 28 days. 84 tablet 0  . prednisoLONE (ORAPRED) 15 MG/5ML solution Take 1.6-13.3 mLs by mouth See admin instructions. Take 13.203ml by mouth daily for 28 days, 10ml for 4 days, 6.806ml daily for 4 days, 3.3 daily for 4 days, then 1.266ml daily for 3 days.    Marland Kitchen. sertraline (ZOLOFT) 25 MG tablet Take 25 mg by mouth at bedtime.    . thiamine 100 MG tablet Take 1 tablet (100 mg total) by mouth daily. 100 tablet 0  . buPROPion (WELLBUTRIN XL) 300 MG 24  hr tablet Take 1 tablet (300 mg total) by mouth daily. (Patient not taking: No sig reported) 30 tablet 0  . carbamazepine (TEGRETOL XR) 200 MG 12 hr tablet Take 1 tablet (200 mg total) by mouth at bedtime. (Patient not taking: No sig reported) 30 tablet 0  . hydrOXYzine (ATARAX/VISTARIL) 25 MG tablet Take 1 tablet (25 mg total) by mouth every 6 (six) hours as needed for anxiety (or CIWA score </= 10). (Patient not taking: No sig reported) 30 tablet 0  . lisinopril-hydrochlorothiazide (ZESTORETIC) 20-25 MG  tablet Take 1 tablet by mouth daily. (Patient not taking: No sig reported)    . oxyCODONE (ROXICODONE) 5 MG immediate release tablet Take 1 tablet (5 mg total) by mouth every 8 (eight) hours as needed. (Patient not taking: No sig reported) 20 tablet 0  . prednisoLONE 5 MG TABS tablet Take 8 tablets (40 mg total) by mouth daily for 28 days, THEN 6 tablets (30 mg total) daily for 4 days, THEN 4 tablets (20 mg total) daily for 4 days, THEN 2 tablets (10 mg total) daily for 4 days, THEN 1 tablet (5 mg total) every 3 (three) days for 1 day. (Patient not taking: Reported on 09/13/2020) 275 tablet 0  . traMADol (ULTRAM) 50 MG tablet Take 1 tablet (50 mg total) by mouth every 6 (six) hours as needed. (Patient not taking: Reported on 09/13/2020)      Past Medical History:  Diagnosis Date  . Alcohol abuse   . Asthma   . Depression   . Hypertension     Past Surgical History:  Procedure Laterality Date  . BUBBLE STUDY  09/19/2020   Procedure: BUBBLE STUDY;  Surgeon: Pricilla Riffle, MD;  Location: [facility] ENDOSCOPY;  Service: Cardiovascular;;  . FRACTURE SURGERY    . TEE WITHOUT CARDIOVERSION N/A 09/19/2020   Procedure: TRANSESOPHAGEAL ECHOCARDIOGRAM (TEE);  Surgeon: Pricilla Riffle, MD;  Location: [facility] ENDOSCOPY;  Service: Cardiovascular;  Laterality: N/A;  . TOTAL HIP ARTHROPLASTY Left 09/15/2020   Procedure: TOTAL HIP ARTHROPLASTY WITH CEMENT INTERPOSITION SPACING;  Surgeon: Teryl Lucy, MD;  Location: [facility] ORS;  Service: Orthopedics;  Laterality: Left;    Social History   Tobacco Use  . Smoking status: Current Some Day Smoker    Types: Cigars  . Smokeless tobacco: Never Used  Substance Use Topics  . Alcohol use: Yes    Alcohol/week: 12.0 - 18.0 standard drinks    Types: 12 - 18 Cans of beer per week    Comment: heavy drinker  . Drug use: Yes    Types: Marijuana    Family History  Problem Relation Age of Onset  . Cirrhosis Neg Hx   . Diabetes Mellitus II Neg Hx     PE: Vitals:   09/20/20 1100  09/20/20 1200 09/20/20 1300 09/20/20 1400  BP: (!) 118/57 124/71 120/67 125/64  Pulse: (!) 106 (!) 107 (!) 106 (!) 103  Resp: (!) 21 19 18 17   Temp:  99.2 F (37.3 C)    TempSrc:  Oral    SpO2: 92% 95% 98% 95%  Weight:      Height:       Patient appears to be in no acute distress  patient is alert and oriented x3 Atraumatic normocephalic head No cervical or supraclavicular lymphadenopathy appreciated No increased work of breathing, no audible wheezes/rhonchi Regular sinus rhythm/rate Abdomen is soft, nontender, nondistended, no CVA or suprapubic tenderness Left lower extremity with several drains emanating from the hip region and thigh region.  The leg is edematous.  He has some blistering on the medial aspect of his thigh adjacent to the scrotum which is also swollen and tender.  He has some foreskin edema as well. Grossly neurologically intact   Recent Labs    09/18/20 0303 09/19/20 0305 09/20/20 0244  WBC 15.1* 14.5* 10.7*  HGB 9.8* 8.8* 7.7*  HCT 30.2* 26.6* 24.0*   Recent Labs    09/18/20 0303 09/19/20 0305 09/20/20 0244  NA 129* 129* 131*  K 4.4 4.4 4.3  CL 98 96* 95*  CO2 GLUCOSE 91 100* 96  BUN CREATININE 0.44* 0.41* 0.43*  CALCIUM 7.5* 7.4* 7.4*   Recent Labs    09/19/20 0305  INR 1.5*   No results for input(s): LABURIN in the last 72 hours. Results for orders placed or performed during the hospital encounter of 09/13/20  Culture, blood (routine x 2)     Status: Abnormal   Collection Time: 09/13/20 10:45 AM   Specimen: BLOOD  Result Value Ref Range Status   Specimen Description   Final    BLOOD RIGHT ANTECUBITAL Performed at [facility], [address] [place], [place] [address]    Special Requests   Final    BOTTLES DRAWN AEROBIC AND ANAEROBIC Blood Culture adequate volume Performed at [facility], [address], [place], [place] [address]    Culture  Setup Time   Final    IN BOTH  AEROBIC AND ANAEROBIC BOTTLES GRAM POSITIVE COCCI CRITICAL RESULT CALLED TO, READ BACK BY AND VERIFIED WITHErling Cruz [facility] 09/13/20 0347 JDW Performed at [facility], [address], [place], [place] [address]    Culture STAPHYLOCOCCUS AUREUS (A)  Final   Report Status 09/16/2020 FINAL  Final   Organism ID, Bacteria STAPHYLOCOCCUS AUREUS  Final      Susceptibility   Staphylococcus aureus - MIC*    CIPROFLOXACIN <=0.5 SENSITIVE Sensitive     ERYTHROMYCIN <=0.25 SENSITIVE Sensitive     GENTAMICIN <=0.5 SENSITIVE Sensitive     OXACILLIN <=0.25 SENSITIVE Sensitive     TETRACYCLINE <=1 SENSITIVE Sensitive     VANCOMYCIN <=0.5 SENSITIVE Sensitive     TRIMETH/SULFA <=10 SENSITIVE Sensitive     CLINDAMYCIN <=0.25 SENSITIVE Sensitive     RIFAMPIN <=0.5 SENSITIVE Sensitive     Inducible Clindamycin NEGATIVE Sensitive     * STAPHYLOCOCCUS AUREUS  Blood Culture ID Panel (Reflexed)     Status: Abnormal   Collection Time: 09/13/20 10:45 AM  Result Value Ref Range Status   Enterococcus faecalis NOT DETECTED NOT DETECTED Final   Enterococcus Faecium NOT DETECTED NOT DETECTED Final   Listeria monocytogenes NOT DETECTED NOT DETECTED Final   Staphylococcus species DETECTED (A) NOT DETECTED Final    Comment: CRITICAL RESULT CALLED TO, READ BACK BY AND VERIFIED WITH: E JACKSON PHARMD 09/13/20 0347 JDW    Staphylococcus aureus (BCID) DETECTED (A) NOT DETECTED Final    Comment: CRITICAL RESULT CALLED TO, READ BACK BY AND VERIFIED WITH: E JACKSON PHARMD 09/13/20 0347 JDW    Staphylococcus epidermidis NOT DETECTED NOT DETECTED Final   Staphylococcus lugdunensis NOT DETECTED NOT DETECTED Final   Streptococcus species NOT DETECTED NOT DETECTED Final   Streptococcus agalactiae NOT DETECTED NOT DETECTED Final   Streptococcus pneumoniae NOT DETECTED NOT DETECTED Final   Streptococcus pyogenes NOT DETECTED NOT DETECTED Final   A.calcoaceticus-baumannii NOT DETECTED NOT DETECTED Final   Bacteroides  fragilis NOT DETECTED  NOT DETECTED Final   Enterobacterales NOT DETECTED NOT DETECTED Final   Enterobacter cloacae complex NOT DETECTED NOT DETECTED Final   Escherichia coli NOT DETECTED NOT DETECTED Final   Klebsiella aerogenes NOT DETECTED NOT DETECTED Final   Klebsiella oxytoca NOT DETECTED NOT DETECTED Final   Klebsiella pneumoniae NOT DETECTED NOT DETECTED Final   Proteus species NOT DETECTED NOT DETECTED Final   Salmonella species NOT DETECTED NOT DETECTED Final   Serratia marcescens NOT DETECTED NOT DETECTED Final   Haemophilus influenzae NOT DETECTED NOT DETECTED Final   Neisseria meningitidis NOT DETECTED NOT DETECTED Final   Pseudomonas aeruginosa NOT DETECTED NOT DETECTED Final   Stenotrophomonas maltophilia NOT DETECTED NOT DETECTED Final   Candida albicans NOT DETECTED NOT DETECTED Final   Candida auris NOT DETECTED NOT DETECTED Final   Candida glabrata NOT DETECTED NOT DETECTED Final   Candida krusei NOT DETECTED NOT DETECTED Final   Candida parapsilosis NOT DETECTED NOT DETECTED Final   Candida tropicalis NOT DETECTED NOT DETECTED Final   Cryptococcus neoformans/gattii NOT DETECTED NOT DETECTED Final   Meth resistant mecA/C and MREJ NOT DETECTED NOT DETECTED Final    Comment: Performed at [facility], [address] [place], [place] [address]  Culture, blood (routine x 2)     Status: Abnormal   Collection Time: 09/13/20 11:20 AM   Specimen: BLOOD  Result Value Ref Range Status   Specimen Description   Final    BLOOD RIGHT ANTECUBITAL Performed at [facility], [address], [place], [place] [address]    Special Requests   Final    BOTTLES DRAWN AEROBIC AND ANAEROBIC Blood Culture adequate volume Performed at [facility], [address], [place], [place] [address]    Culture  Setup Time   Final    IN BOTH AEROBIC AND ANAEROBIC BOTTLES GRAM POSITIVE COCCI CRITICAL VALUE NOTED.  VALUE IS CONSISTENT WITH PREVIOUSLY  REPORTED AND CALLED VALUE.    Culture (A)  Final    STAPHYLOCOCCUS AUREUS SUSCEPTIBILITIES PERFORMED ON PREVIOUS CULTURE WITHIN THE LAST 5 DAYS. Performed at [facility], [address], [place], [place] [address]    Report Status 09/16/2020 FINAL  Final  Resp Panel by RT-PCR (Flu A&B, Covid) Nasopharyngeal Swab     Status: None   Collection Time: 09/13/20  2:01 PM   Specimen: Nasopharyngeal Swab; Nasopharyngeal(NP) swabs in vial transport medium  Result Value Ref Range Status   SARS Coronavirus 2 by RT PCR NEGATIVE NEGATIVE Final    Comment: (NOTE) SARS-CoV-2 target nucleic acids are NOT DETECTED.  The SARS-CoV-2 RNA is generally detectable in upper respiratory specimens during the acute phase of infection. The lowest concentration of SARS-CoV-2 viral copies this assay can detect is 138 copies/mL. A negative result does not preclude SARS-Cov-2 infection and should not be used as the sole basis for treatment or other patient management decisions. A negative result may occur with  improper specimen collection/handling, submission of specimen other than nasopharyngeal swab, presence of viral mutation(s) within the areas targeted by this assay, and inadequate number of viral copies(<138 copies/mL). A negative result must be combined with clinical observations, patient history, and epidemiological information. The expected result is Negative.  Fact Sheet for Patients:  BloggerCourse.com  Fact Sheet for Healthcare Providers:  SeriousBroker.it  This test is no t yet approved or cleared by the [place] FDA and  has been authorized for detection and/or diagnosis of SARS-CoV-2 by FDA under an Emergency Use  Authorization (EUA). This EUA will remain  in effect (meaning this test can be used) for the duration of the COVID-19 declaration under Section 564(b)(1) of the Act, 21 U.S.C.section 360bbb-3(b)(1), unless the authorization  is terminated  or revoked sooner.       Influenza A by PCR NEGATIVE NEGATIVE Final   Influenza B by PCR NEGATIVE NEGATIVE Final    Comment: (NOTE) The Xpert Xpress SARS-CoV-2/FLU/RSV plus assay is intended as an aid in the diagnosis of influenza from Nasopharyngeal swab specimens and should not be used as a sole basis for treatment. Nasal washings and aspirates are unacceptable for Xpert Xpress SARS-CoV-2/FLU/RSV testing.  Fact Sheet for Patients: BloggerCourse.com  Fact Sheet for Healthcare Providers: SeriousBroker.it  This test is not yet approved or cleared by the [place] FDA and has been authorized for detection and/or diagnosis of SARS-CoV-2 by FDA under an Emergency Use Authorization (EUA). This EUA will remain in effect (meaning this test can be used) for the duration of the COVID-19 declaration under Section 564(b)(1) of the Act, 21 U.S.C. section 360bbb-3(b)(1), unless the authorization is terminated or revoked.  Performed at [facility], [address], [place], [place] [address]   Culture, blood (routine x 2)     Status: None   Collection Time: 09/14/20  9:23 AM   Specimen: BLOOD RIGHT HAND  Result Value Ref Range Status   Specimen Description   Final    BLOOD RIGHT HAND Performed at [facility], [address], [place], [place] [address]    Special Requests   Final    BOTTLES DRAWN AEROBIC AND ANAEROBIC Blood Culture adequate volume Performed at [facility], [address], [place], [place] [address]    Culture   Final    NO GROWTH 5 DAYS Performed at [facility], [address], [place], [place] [address]    Report Status 09/19/2020 FINAL  Final  Culture, blood (routine x 2)     Status: None   Collection Time: 09/14/20  9:24 AM   Specimen: BLOOD LEFT HAND  Result Value Ref Range Status   Specimen Description   Final    BLOOD LEFT  HAND Performed at [facility], [address] [place], [place] [address]    Special Requests   Final    BOTTLES DRAWN AEROBIC ONLY Blood Culture adequate volume Performed at [facility], [address], [place], [place] [address]    Culture   Final    NO GROWTH 5 DAYS Performed at [facility], [address], [place], [place] [address]    Report Status 09/19/2020 FINAL  Final  Aerobic/Anaerobic Culture (surgical/deep wound)     Status: None   Collection Time: 09/14/20 12:37 PM   Specimen: Abscess  Result Value Ref Range Status   Specimen Description   Final    ABSCESS  LEFT RP Performed at [facility], [address], [place], [place] [address]    Special Requests   Final    Normal Performed at [facility], [address], [place], [place] [address]    Gram Stain   Final    MODERATE WBC PRESENT, PREDOMINANTLY PMN ABUNDANT GRAM POSITIVE COCCI ABUNDANT GRAM NEGATIVE RODS    Culture   Final    ABUNDANT STAPHYLOCOCCUS AUREUS MODERATE BACTEROIDES ORALIS FEW PREVOTELLA BIVIA BETA LACTAMASE POSITIVE Performed at [facility], [address], [place], [place] [address]  Report Status 09/19/2020 FINAL  Final   Organism ID, Bacteria STAPHYLOCOCCUS AUREUS  Final      Susceptibility   Staphylococcus aureus - MIC*    CIPROFLOXACIN <=0.5 SENSITIVE Sensitive     ERYTHROMYCIN <=0.25 SENSITIVE Sensitive     GENTAMICIN <=0.5 SENSITIVE Sensitive     OXACILLIN <=0.25 SENSITIVE Sensitive     TETRACYCLINE <=1 SENSITIVE Sensitive     VANCOMYCIN <=0.5 SENSITIVE Sensitive     TRIMETH/SULFA <=10 SENSITIVE Sensitive     CLINDAMYCIN <=0.25 SENSITIVE Sensitive     RIFAMPIN <=0.5 SENSITIVE Sensitive     Inducible Clindamycin NEGATIVE Sensitive     * ABUNDANT STAPHYLOCOCCUS AUREUS  MRSA PCR Screening     Status: None   Collection Time: 09/15/20  3:22 AM   Specimen: Nasal Mucosa; Nasopharyngeal   Result Value Ref Range Status   MRSA by PCR NEGATIVE NEGATIVE Final    Comment:        The GeneXpert MRSA Assay (FDA approved for NASAL specimens only), is one component of a comprehensive MRSA colonization surveillance program. It is not intended to diagnose MRSA infection nor to guide or monitor treatment for MRSA infections. Performed at [facility], [address] [place], [place] [address]     Imaging: I reviewed the patient's CT scan which was performed on 09/18/2020 demonstrates some simple ascites within the pelvis and improvement in the abscess along the iliopsoas region in the gluteal muscle.  There is no evidence of any infection or involvement within the scrotum.  He does have some diffuse edema in the groin region and in the scrotal wall.  Imp: Unfortunate patient with several abscesses on the ulcers region as well as the left gluteal muscle.  He has some scrotal edema and some tenderness as well as some penile edema, but no evidence of real infection.  I suspect this is all swelling and reaction from the adjacent infections.  Recommendations: No scrotal torsion or intervention at this time.  Given his limited mobility, I do recommend that a Foley catheter replaced to allow him to more completely empty his bladder while in the supine position.  Once he is more mobile and able to void more easily be reasonable to remove his catheter.  Given his limited mobility   Thank you for involving me in this patient's care, Please page with any further questions or concerns. Crist Fat

## 2020-09-20 NOTE — Anesthesia Procedure Notes (Signed)
Procedure Name: Intubation Date/Time: 09/20/2020 4:55 PM Performed by: Elyn Peers, CRNA Pre-anesthesia Checklist: Patient identified, Emergency Drugs available, Suction available, Patient being monitored and Timeout performed Patient Re-evaluated:Patient Re-evaluated prior to induction Oxygen Delivery Method: Circle system utilized Preoxygenation: Pre-oxygenation with 100% oxygen Induction Type: IV induction Ventilation: Mask ventilation without difficulty Laryngoscope Size: Miller and 3 Grade View: Grade I Tube type: Oral Tube size: 7.5 mm Number of attempts: 1 Airway Equipment and Method: Stylet Placement Confirmation: ETT inserted through vocal cords under direct vision,  positive ETCO2 and breath sounds checked- equal and bilateral Secured at: 23 cm Tube secured with: Tape Dental Injury: Teeth and Oropharynx as per pre-operative assessment  Comments: Attempted to place LMA #5 followed by LMA #4 - unable to seat properly.   Switched airway management to intubation.

## 2020-09-20 NOTE — Progress Notes (Signed)
PROGRESS NOTE    [name]  MQK:[id number] DOB: 1986/03/07 DOA: 09/13/2020 PCP: [name], PA   Brief Narrative:  HPI per Dr. Cherylann Ratel on 09/13/20 [name] is a 35 y.o. male with medical history significant of AVN of hip, HTN, GERD. Presenting with fatigue and left leg pain. Recent admission for COVID (08/22/20). Successfully completed therapy and was discharged on 08/29/20. At that time he was discharged to treatment for alcoholic hepatitis as well. He was ok for a few days after discharge, but he began to feel very tired and weak. He did not try any medicines to help. He tried rest. This progressed over these last 2 weeks. Also during this time, he began having left thigh and leg pain. He reports that the left thigh had become increasingly swollen and tender to touch. He has had a blister rupture on the inner thigh in the groin area. These symptoms increased through 2 days ago when he was no longer ago to walk without great pain. He saw his PCP yesterday who drew labs. He was called this morning by that team and advised to go to the ED immediately. He denies any other aggravating or alleviating factors.   ED Course: He was found to have a Hgb of 6.2. pRBCs were ordered. He was FOBT negative. CT imaging showed left iliopsoas and left gluteal abscesses w/ diffuse swelling and left hip effusion. CCS was consulted. Patient was started on cefazolin and vanc. TRH was called for admission.   **Interim History  Patient now has an MSSA bacteremia so ID was consulted and blood cultures repeated an echocardiogram was obtained.  Because of his abscesses and his iliopsoas muscle and multiple normal abscesses in the legs he underwent 3 drain placement by IR.  Orthopedic surgery is evaluating and planning for surgical intervention 09/15/20 with repeat Intervention on 09/20/20.  Blood count is improved after 2 units of PRBCs and he will be getting more units today for his Left Hip Interposition  Arthoplasty today.  Blood cultures are no growth to date so far and he continues to be on IV cefazolin however infectious diseases recommending a TEE so we will contact cardiology to set this up will be done per their scheduling  After his left hip total plasty with antibiotic spacer placement and debridement he had septic shock and decompensated and had hypotension despite 2 unit PRBCs, 2.2 L of crystalloid and 500 mL of albumin.  He was admitted to the ICU and started on peripheral pressors and critical care assumed care for 09/16/2020 and now is back on [facility] service.  He appears volume overloaded from all the fluid that he is gotten and has hepatic cirrhosis so likely has some anasarca he has intermittently been diuresed.  He continues to be volume overloaded so was given another dose of IV Lasix however his lactic acid level started trending slightly upwards and went from 2.8 -> 3.1 so was given a 250 mL bolus.  PT OT recommending CIR.  Scrotal ultrasound was done given his scrotal swelling and did not show any evidence of any testicular mass or torsion but did show asymmetric left scrotal wall thickening with fluid and increased blood so due to soft tissue infection related to his more likely widespread process in the left pelvis and thigh.  We will need to continue to monitor this for abscess formation but continue antibiotics with IV cefazolin and ID adding Flagyl for Anaerobic Coverage .  His beta-blocker was resumed yesterday  as well given that he was tachycardic and likely had a rebound tachycardia.  Beta-blocker is increased yesterday and will hold his IV Lasix today given that he is will be going to the operating room for an irrigation and debridement.  Patient underwent a TEE which showed the LVEF and RVEF were normal with normal valves and no evidence of vegetation.  Assessment & Plan:   Principal Problem:   MSSA bacteremia Active Problems:   Alcohol dependence ([place])   Alcoholic hepatitis    Sepsis ([place])   Septic arthritis ([place])   Iliopsoas abscess on left ([place])  Iliopsoas and left gluteal abscesses associated with MSSA Bacteremia  Septic shock secondary to above and left hip septic arthritis Left hip effusion and septic arthritis in the setting of avascular necrosis -Admitted to Inpatient Telemetry  -Initially started on IV vanc, cefepime; pharmacy to dose but changed to IV Cefazolin by ID -Repeat Blood Cx x2 and obtain ECHO; See below -Initial Blood Cx 09/13/20 showed Staph Aureus in Both sets of Cx's -Fluids had initially stopped but then he received 2.2 crystalloid boluses as well as 2 units of PRBCs postoperatively -Sepsis Criteria met with: tachycardia, tachypnea, lactic acid 3.3, and Leukocytosis was 10.6 source: abscesses as above as well as bacteremia and went into septic shock during this admission -PCT was 3.00 and trended up to 21.56 -> 7.78 -> 4.14 and today it is 2.02 -WBC has gone from 10.6 -> 8.3 -> 12.8 -> 31.6 -> 12.1 -> 15.1 and is now 14.5 and is now 10.7 and almost normalized  -Lactic trended up to 5.3 and then trended down to 2.8 and is 3.1 yesterday but was not repeated -General surgery recs IR involvement for possible abscess drainage and for ortho to review (CCS consulted IR, EDP has consulted ortho); appreciate assistance, will await further recs -Surgery had planned a total hip replacement early March and plan is to currently drain iliopsoas abscess today and regular pulse the patient for a left hip interposition arthroplasty tomorrow with Dr. Mardelle Matte and will continue antibiotics and they will make the patient n.p.o. at midnight today -Interventional radiology 3 drains in place today via CT-guided placement with a 14 French drainage catheter into the left retroperitoneal abscess into 12 French drains in the medial and lateral abscesses with the anterior aspect of left thigh and there is a total of 1.3 L of purulent fluid that was aspirated following drain  placement sent for evaluation -Interventional Radiology evaluated the drains and they are recommending continue drain management and continue every shift flushes and monitoring of output; he is going to go to the OR today and if the drains are remain and the following plan for this is to repeat a CT scan when his drain output is less than 10 cc/day to assess for possible removal -Drain Gram Stain showing Moderate WBC, Abundant Gram Positive Cocci, and Abundant Gram Negative Rods with Cultures showing abundant Staph Aureus which is pan sensitive -Infectious disease is now recommending a prolonged 6-week course of IV cefazolin and he will need oral medications after for extension and they recommend that is okay to place a PICC but they are recommending to wait there is ongoing abscess and drainage could also consider rifampin at some point. -Infectious diseases consulted and recommending changing antibiotic to cefazolin and following repeat echocardiogram; ECHO as below did not mention any vegetation; infectious diseases is now recommending the addition of Flagyl -Repeat blood cultures have been done and show NGTD at 5 Days;  since if TTE is negative they can recommend a transesophageal echocardiogram then but since his TTE was a difficult Study, ID Dr. Juleen China recommending TEE given Disseminated MSSA infection so he underwent a transesophageal echocardiogram on Monday, 09/19/2020. -Patient underwent a repeat CT scan of the abdomen pelvis as well as the femur and showed " Interval placement of left pelvic and two left thigh drains for multiple abscesses. The pelvic components of the fluid collections have mildly improved, but there are multiple persistent large peripherally enhancing  complex fluid collections within the left thigh which are incompletely drained. These are quite extensive in length, extending into the distal thigh. The proximal component of the anteromedial collection has  slightly enlarged. No  evidence of osteomyelitis or large hip joint effusion. 3. Interval left total hip arthroplasty. The hardware appears well positioned." -Because of this orthopedic surgery reviewed the CT scan and I feel the best course of action is to take the patient for an open irrigation debridement of the medial and lateral thigh to express these fluid collections and recommending n.p.o. at midnight; patient to go to the OR this afternoon -Continue PT OT evaluation and continue dressing changes per orthopedic surgery and VT prophylaxis further recommendations but currently this is held given his anemia and thrombocytopenia -PT/OT recommending CIR but once he is only cleared from a medical perspective and once orthopedic surgery signed off the case and has no further debridement plans  Symptomatic Anemia Macrocytic Anemia -Presented with Hgb/Hct of 6.2/18.9 -Patient is status post 4 units of PRBCs total and hemoglobin/hematocrit went from 7.5/22.5 -> 9.8/30.2 and has started trending down again in 1-8.8/26.6 and today it is 7.7/24.0; if necessary will need some more PRBCs in the OR -FOBT negative -Anemia panel done and showed an iron level of 44, U IBC 131, TIBC 175, saturation ratios of 25% -check LDH and was 308, haptoglobin pending, iron studies showed an iron level of 44, U IBC 131, TIBC 175, saturation ratios of 25% -Continue to monitor for signs and symptoms of bleeding; currently no overt bleeding noted next -repeat CBC in a.m.  Hx of Alcoholic Hepatitis Hepatic Cirrhosis Hyperbilirubinemia, was improving and now worsening  Mildly abnormal AST -Was continiung Prednisolone (he is on 42m through 09/26/20 and then tapers) but given his disseminated MSSA bacteremia will discontinue taper given LFTs being improved and because T Bili Trending down and is gone from 3.2 -> 1.3 -> 1.7 and further trended down to 1.6. I spoke with Dr. KTherisa Doyneof Gastroenterology who feels the patient does not need taper now that  this has been discontinued -Check PT-INR in the AM and CMP again in the AM; PT/INR was 17.3 and INR was 1.5 yesterday -Patient and family report he has been alcohol free for 1 month -Continue lactulose 10 g p.o. twice daily, thiamine, magnesium -Patient's ammonia level was 45 yesterday not repeated today but will repeat again in the morning as well as continuing to monitor his PT and INR but this was not done today -Patient was given IV fluid hydration with LR and will stop given his hepatic cirrhosis and poor albumin status; given a bolus yesterday given his and lactic acid being elevated -AST is now improved and normalized to 39 -Continue monitor hepatic function daily and if worsening will call GI for further assistance but at this time will discontinue the prednisone taper   Hx of Tachycardia; Now in Sinus Tachycardia  HTN  -Baseline heart rate is anywhere between 100-110 per family -Continue Carvedilol  3.125 mg p.o. twice daily (held due to hypotension but now resumed) and will increase the dose to 6.2 5 in the AM and will continue for now  GAD -Continue sertraline 25 mg p.o. nightly  GERD -C/w Pantoprazole 40 mg p.o. twice daily  Hyponatremia -Was initiated on IV fluids with normal saline when he went into septic shock but this is now stopped and was given diuresis -Sodium is now 129 yesterday and today is 131 -He was given a dose of IV sodium phosphate yesterday -We will give another dose of IV Lasix as he has received 1 daily for the last 3 days but will hold today's dose and given a sublingual or -Continue to monitor and trend and repeat CMP in a.m.  Metabolic Acidosis -Mild and is now improved -Patient CO2 is now 23, anion gap is 13, chloride level is 95 -IV fluid hydration was resumed by the ICU team given his septic shock but will now discontinue given his volume overload and anasarca -Continue to monitor and trend and repeat CMP in a.m.  Hypophosphatemia -Patient's  phosphorus level was 1.6 and improved to 3.4 yesterday and today is 2.7 -Continue to monitor and replete as necessary -Repeat CBC in a.m.  Scrotal Edema and Anasarca, persistent -Patient is positive + 4.007 L since admission -In the setting of his hepatic cirrhosis and IV fluid resuscitation as well as his infection -Stopped IV fluids and recommend scrotal elevation and sling and obtain a scrotal ultrasound and Doppler to rule out any pathology -Scrotal U/S done and showed "No evidence of testicular mass or torsion. Marked asymmetric left scrotal wall thickening and fluid, with increased blood flow. This is likely due to soft tissue infection, and part of the much more widespread process in the left pelvis and thigh demonstrated on prior CT." -Foley catheter has now been removed and he is diuresed last 3 days will hold diuresis today given that he is going to the OR -Foley to be removed as patient is getting up with Therapy  -Trial of void early in the morning and discontinue Foley at that time -continue with IV Lasix and received another dose today -Continue with Extremity Elevation   Thrombocytopenia  -Patient's platelet count had dropped from 170,000 and could be in the setting of his cirrhosis and surgical issues and his platelet count dropped to 72,000 but is now trending back up and is now 99,000 -Continue to monitor for signs and symptoms of bleeding; currently no overt bleeding noted -Repeat CBC in a.m.  DVT prophylaxis: SCDs Code Status: FULL CODE  Family Communication: Discussed with wife at bedside Disposition Plan: Pending further clinical improvement and evaluation and clearance by specialist; likely to go back to the OR in the morning  Status is: Inpatient  Remains inpatient appropriate because:Unsafe d/c plan, IV treatments appropriate due to intensity of illness or inability to take PO and Inpatient level of care appropriate due to severity of illness   Dispo: The  patient is from: Home              Anticipated d/c is to: TBD; PT OT to evaluate and treat              Anticipated d/c date is: 3 days              Patient currently is not medically stable to d/c.   Difficult to place patient No   Consultants:   General Surgery  Orthopedic Surgery  Infectious Diseases  Interventional  Radiology  Case was discussed with Gastroenterology    Procedures:  ECHOCARDIOGRAM IMPRESSIONS    1. Left ventricular ejection fraction, by estimation, is 60 to 65%. The  left ventricle has normal function. The left ventricle has no regional  wall motion abnormalities. There is moderate concentric left ventricular  hypertrophy. Left ventricular  diastolic function could not be evaluated. Elevated left ventricular  end-diastolic pressure.  2. Right ventricular systolic function is normal. The right ventricular  size is normal. Tricuspid regurgitation signal is inadequate for assessing  PA pressure.  3. The mitral valve is normal in structure. No evidence of mitral valve  regurgitation. No evidence of mitral stenosis.  4. The aortic valve is normal in structure. Aortic valve regurgitation is  not visualized. No aortic stenosis is present.  5. The inferior vena cava is normal in size with greater than 50%  respiratory variability, suggesting right atrial pressure of 3 mmHg.  6. Trivial pericardial effusion is present. The pericardial effusion is  circumferential.   FINDINGS  Left Ventricle: Left ventricular ejection fraction, by estimation, is 60  to 65%. The left ventricle has normal function. The left ventricle has no  regional wall motion abnormalities. The left ventricular internal cavity  size was normal in size. There is  moderate concentric left ventricular hypertrophy. Left ventricular  diastolic function could not be evaluated. Elevated left ventricular  end-diastolic pressure.   Right Ventricle: The right ventricular size is normal. No  increase in  right ventricular wall thickness. Right ventricular systolic function is  normal. Tricuspid regurgitation signal is inadequate for assessing PA  pressure.   Left Atrium: Left atrial size was normal in size.   Right Atrium: Right atrial size was normal in size.   Pericardium: Trivial pericardial effusion is present. The pericardial  effusion is circumferential.   Mitral Valve: The mitral valve is normal in structure. No evidence of  mitral valve regurgitation. No evidence of mitral valve stenosis.   Tricuspid Valve: The tricuspid valve is normal in structure. Tricuspid  valve regurgitation is mild . No evidence of tricuspid stenosis.   Aortic Valve: The aortic valve is normal in structure. Aortic valve  regurgitation is not visualized. No aortic stenosis is present.   Pulmonic Valve: The pulmonic valve was normal in structure. Pulmonic valve  regurgitation is not visualized. No evidence of pulmonic stenosis.   Aorta: The aortic root is normal in size and structure.   Venous: The inferior vena cava is normal in size with greater than 50%  respiratory variability, suggesting right atrial pressure of 3 mmHg.   IAS/Shunts: No atrial level shunt detected by color flow Doppler.     LEFT VENTRICLE  PLAX 2D  LVIDd:     4.70 cm   Diastology  LVIDs:     2.80 cm   LV e' medial:  3.81 cm/s  LV PW:     1.50 cm   LV E/e' medial: 31.0  LV IVS:    1.40 cm   LV e' lateral:  6.64 cm/s  LVOT diam:   2.30 cm   LV E/e' lateral: 17.8  LV SV:     78  LV SV Index:  39  LVOT Area:   4.15 cm    LV Volumes (MOD)  LV vol d, MOD A2C: 69.7 ml  LV vol d, MOD A4C: 115.0 ml  LV vol s, MOD A2C: 25.9 ml  LV vol s, MOD A4C: 47.1 ml  LV SV MOD A2C:   43.8  ml  LV SV MOD A4C:   115.0 ml  LV SV MOD BP:   51.8 ml   IVC  IVC diam: 1.50 cm   LEFT ATRIUM      Index    RIGHT ATRIUM      Index  LA diam:   3.80 cm 1.93 cm/m  RA Area:   12.30 cm  LA Vol (A2C): 39.5 ml 20.08 ml/m RA Volume:  23.60 ml 12.00 ml/m  LA Vol (A4C): 32.6 ml 16.57 ml/m  AORTIC VALVE  LVOT Vmax:  130.00 cm/s  LVOT Vmean: 86.000 cm/s  LVOT VTI:  0.187 m    AORTA  Ao Root diam: 3.30 cm  Ao Asc diam: 3.20 cm   MITRAL VALVE  MV Area (PHT): 5.84 cm   SHUNTS  MV Decel Time: 130 msec   Systemic VTI: 0.19 m  MV E velocity: 118.00 cm/s Systemic Diam: 2.30 cm   DRAIN PLACEMENT  Septic effusion of the left hip and associated RP and thigh abscesses Post procedural Dx: Same  Technically successful [place] and CT guided placement of a 14 Fr drainage catheter into left retroperitoneal abscess and two 12 Fr drains into medial and lateral abscesses within the anterior aspect of the left thigh.  A total of 1.3 L of purulent fluid was aspirated following drain placement. A representative aspirated sample was capped and sent to the laboratory for analysis.    Antimicrobials:  Anti-infectives (From admission, onward)   Start     Dose/Rate Route Frequency Ordered Stop   09/18/20 1400  [MAR Hold]  metroNIDAZOLE (FLAGYL) tablet 500 mg        (MAR Hold since Tue 09/20/2020 at 1503.Hold Reason: Transfer to a Procedural area.)   500 mg Oral Every 8 hours 09/18/20 0927     09/15/20 1715  tobramycin (NEBCIN) powder  Status:  Discontinued          As needed 09/15/20 1715 09/15/20 2043   09/15/20 1715  vancomycin (VANCOCIN) powder  Status:  Discontinued          As needed 09/15/20 1716 09/15/20 2043   09/14/20 0600  [MAR Hold]  ceFAZolin (ANCEF) IVPB 2g/100 mL premix        (MAR Hold since Tue 09/20/2020 at 1503.Hold Reason: Transfer to a Procedural area.)   2 g 200 mL/hr over 30 Minutes Intravenous Every 8 hours 09/14/20 0359     09/13/20 2200  vancomycin (VANCOREADY) IVPB 1500 mg/300 mL  Status:  Discontinued        1,500 mg 150 mL/hr over 120 Minutes Intravenous Every 12 hours 09/13/20 1636 09/14/20 0359   09/13/20 1500  ceFEPIme  (MAXIPIME) 2 g in sodium chloride 0.9 % 100 mL IVPB  Status:  Discontinued        2 g 200 mL/hr over 30 Minutes Intravenous Every 8 hours 09/13/20 1437 09/14/20 0359   09/13/20 1130  vancomycin (VANCOREADY) IVPB 1750 mg/350 mL        1,750 mg 175 mL/hr over 120 Minutes Intravenous  Once 09/13/20 1107 09/13/20 1414   09/13/20 1045  ceFAZolin (ANCEF) IVPB 1 g/50 mL premix        1 g 100 mL/hr over 30 Minutes Intravenous  Once 09/13/20 1031 09/13/20 1307        Subjective: Seen and examined at bedside and he continues to complain about some left leg pain and swelling.  Wife is still concerned about his scrotal swelling.  No nausea or vomiting.  Will  be going back to the operating room later today.  States that he slept okay.  Happy that his white blood cell count is almost normal and that his procalcitonin level is also trending down.  No other concerns or complaints at this time and continues have 3 drains in place.  Objective: Vitals:   09/20/20 1100 09/20/20 1200 09/20/20 1300 09/20/20 1400  BP: (!) 118/57 124/71 120/67 125/64  Pulse: (!) 106 (!) 107 (!) 106 (!) 103  Resp: (!) _0 Temp:  99.2 F (37.3 C)    TempSrc:  Oral    SpO2: 92% 95% 98% 95%  Weight:      Height:        Intake/Output Summary (Last 24 hours) at 09/20/2020 1527 Last data filed at 09/20/2020 0700 Gross per 24 hour  Intake 390 ml  Output 1045 ml  Net -655 ml   Filed Weights   09/15/20 2111 09/19/20 0500 09/19/20 0843  Weight: 85.1 kg 91.8 kg 97.8 kg   Examination: Physical Exam:  Constitutional: WN/WD overweight Caucasian male currently in mild distress appears uncomfortable complaining of leg pain again today  Eyes: Lids and conjunctivae normal, sclerae anicteric  ENMT: External Ears, Nose appear normal. Grossly normal hearing. Neck: Appears normal, supple, no cervical masses, normal ROM, no appreciable thyromegaly; no JVD Respiratory: Diminished to auscultation bilaterally with coarse breath  sounds, no wheezing, rales, rhonchi or crackles. Normal respiratory effort and patient is not tachypenic. No accessory muscle use.  Unlabored breathing Cardiovascular: Tachycardic rate but regular rhythm, no murmurs / rubs / gallops. S1 and S2 auscultated.  Has 1-2+ lower extremity edema worse on the left compared to right Abdomen: Soft, mildly tender to palpate in the lower quadrants, distended secondary body.  Bowel sounds positive.  GU: Patient's Foley has been removed and he has significant scrotal swelling Musculoskeletal: No clubbing / cyanosis of digits/nails. No joint deformity upper and lower extremities but left leg is extremely swollen and he has 3 drains in it Skin: No rashes, lesions, ulcers on limited skin evaluation. No induration; Warm and dry.  Neurologic: CN 2-12 grossly intact with no focal deficits. Romberg sign and cerebellar reflexes not assessed.  Psychiatric: Normal judgment and insight. Alert and oriented x 3.  Slightly anxious mood and appropriate affect.   Data Reviewed: I have personally reviewed following labs and imaging studies  CBC: Recent Labs  Lab 09/16/20 0238 09/16/20 1419 09/17/20 0511 09/18/20 0303 09/19/20 0305 09/20/20 0244  WBC 31.6*  --  12.1* 15.1* 14.5* 10.7*  NEUTROABS 26.2*  --  9.6* 12.2* 11.8* 8.3*  HGB 6.4* 7.9* 7.5* 9.8* 8.8* 7.7*  HCT 19.5* 23.3* 22.5* 30.2* 26.6* 24.0*  MCV 98.5  --  96.6 97.7 98.5 100.0  PLT 84*  --  72* 86* 85* 99*   Basic Metabolic Panel: Recent Labs  Lab 09/16/20 0238 09/17/20 0511 09/18/20 0303 09/19/20 0305 09/20/20 0244  NA 133* 131* 129* 129* 131*  K 4.7 4.3 4.4 4.4 4.3  CL 104 102 98 96* 95*  CO2 17* _1 GLUCOSE 127* 127* 91 100* 96  BUN _2 CREATININE 0.58* 0.38* 0.44* 0.41* 0.43*  CALCIUM 7.0* 7.2* 7.5* 7.4* 7.4*  MG 1.9 2.0 1.9 1.9 1.9  PHOS 4.4 1.6* 1.6* 3.4 2.7   GFR: Estimated Creatinine Clearance: 149.7 mL/min (A) (by C-G formula based on SCr of 0.43 mg/dL  (L)). Liver Function Tests: Recent Labs  Lab 09/15/20 2300  09/17/20 0511 09/18/20 0303 09/19/20 0305 09/20/20 0244  AST 38 43* 61* 39 30  ALT _0 ALKPHOS 123 165* 270* 244* 240*  BILITOT 3.2* 1.3* 1.7* 1.7* 1.6*  PROT 4.6* 4.9* 5.7* 5.2* 5.2*  ALBUMIN 2.1* 2.0* 2.3* 2.0* 1.9*   No results for input(s): LIPASE, AMYLASE in the last 168 hours. No results for input(s): AMMONIA in the last 168 hours. Coagulation Profile: Recent Labs  Lab 09/14/20 0525 09/15/20 2300 09/19/20 0305  INR 1.5* 2.2* 1.5*   Cardiac Enzymes: No results for input(s): CKTOTAL, CKMB, CKMBINDEX, TROPONINI in the last 168 hours. BNP (last 3 results) No results for input(s): PROBNP in the last 8760 hours. HbA1C: No results for input(s): HGBA1C in the last 72 hours. CBG: Recent Labs  Lab 09/15/20 2116 09/15/20 2152  GLUCAP 61* 86   Lipid Profile: No results for input(s): CHOL, HDL, LDLCALC, TRIG, CHOLHDL, LDLDIRECT in the last 72 hours. Thyroid Function Tests: No results for input(s): TSH, T4TOTAL, FREET4, T3FREE, THYROIDAB in the last 72 hours. Anemia Panel: No results for input(s): VITAMINB12, FOLATE, FERRITIN, TIBC, IRON, RETICCTPCT in the last 72 hours. Sepsis Labs: Recent Labs  Lab 09/15/20 2052 09/15/20 2300 09/18/20 0843 09/18/20 1105 09/19/20 0305 09/20/20 0244  PROCALCITON  --  21.56 7.78  --  4.14 2.02  LATICACIDVEN 5.0* 5.3* 2.8* 3.1*  --   --     Recent Results (from the past 240 hour(s))  Culture, blood (routine x 2)     Status: Abnormal   Collection Time: 09/13/20 10:45 AM   Specimen: BLOOD  Result Value Ref Range Status   Specimen Description   Final    BLOOD RIGHT ANTECUBITAL Performed at [facility], [place] [address] [place], [place] [address]    Special Requests   Final    BOTTLES DRAWN AEROBIC AND ANAEROBIC Blood Culture adequate volume Performed at [place] [address], [place], [place] [address]     Culture  Setup Time   Final    IN BOTH AEROBIC AND ANAEROBIC BOTTLES GRAM POSITIVE COCCI CRITICAL RESULT CALLED TO, READ BACK BY AND VERIFIED WITHSeleta Rhymes [facility] 09/13/20 0347 JDW Performed at [facility], [place] [address] [place], [place] [address]    Culture STAPHYLOCOCCUS AUREUS (A)  Final   Report Status 09/16/2020 FINAL  Final   Organism ID, Bacteria STAPHYLOCOCCUS AUREUS  Final      Susceptibility   Staphylococcus aureus - MIC*    CIPROFLOXACIN <=0.5 SENSITIVE Sensitive     ERYTHROMYCIN <=0.25 SENSITIVE Sensitive     GENTAMICIN <=0.5 SENSITIVE Sensitive     OXACILLIN <=0.25 SENSITIVE Sensitive     TETRACYCLINE <=1 SENSITIVE Sensitive     VANCOMYCIN <=0.5 SENSITIVE Sensitive     TRIMETH/SULFA <=10 SENSITIVE Sensitive     CLINDAMYCIN <=0.25 SENSITIVE Sensitive     RIFAMPIN <=0.5 SENSITIVE Sensitive     Inducible Clindamycin NEGATIVE Sensitive     * STAPHYLOCOCCUS AUREUS  Blood Culture ID Panel (Reflexed)     Status: Abnormal   Collection Time: 09/13/20 10:45 AM  Result Value Ref Range Status   Enterococcus faecalis NOT DETECTED NOT DETECTED Final   Enterococcus Faecium NOT DETECTED NOT DETECTED Final   Listeria monocytogenes NOT DETECTED NOT DETECTED Final   Staphylococcus species DETECTED (A) NOT DETECTED Final    Comment: CRITICAL RESULT CALLED TO, READ BACK BY AND VERIFIED WITH: E JACKSON PHARMD 09/13/20 0347 JDW    Staphylococcus aureus (  BCID) DETECTED (A) NOT DETECTED Final    Comment: CRITICAL RESULT CALLED TO, READ BACK BY AND VERIFIED WITH: E JACKSON PHARMD 09/13/20 0347 JDW    Staphylococcus epidermidis NOT DETECTED NOT DETECTED Final   Staphylococcus lugdunensis NOT DETECTED NOT DETECTED Final   Streptococcus species NOT DETECTED NOT DETECTED Final   Streptococcus agalactiae NOT DETECTED NOT DETECTED Final   Streptococcus pneumoniae NOT DETECTED NOT DETECTED Final   Streptococcus pyogenes NOT DETECTED NOT DETECTED Final   A.calcoaceticus-baumannii NOT  DETECTED NOT DETECTED Final   Bacteroides fragilis NOT DETECTED NOT DETECTED Final   Enterobacterales NOT DETECTED NOT DETECTED Final   Enterobacter cloacae complex NOT DETECTED NOT DETECTED Final   Escherichia coli NOT DETECTED NOT DETECTED Final   Klebsiella aerogenes NOT DETECTED NOT DETECTED Final   Klebsiella oxytoca NOT DETECTED NOT DETECTED Final   Klebsiella pneumoniae NOT DETECTED NOT DETECTED Final   Proteus species NOT DETECTED NOT DETECTED Final   Salmonella species NOT DETECTED NOT DETECTED Final   Serratia marcescens NOT DETECTED NOT DETECTED Final   Haemophilus influenzae NOT DETECTED NOT DETECTED Final   Neisseria meningitidis NOT DETECTED NOT DETECTED Final   Pseudomonas aeruginosa NOT DETECTED NOT DETECTED Final   Stenotrophomonas maltophilia NOT DETECTED NOT DETECTED Final   Candida albicans NOT DETECTED NOT DETECTED Final   Candida auris NOT DETECTED NOT DETECTED Final   Candida glabrata NOT DETECTED NOT DETECTED Final   Candida krusei NOT DETECTED NOT DETECTED Final   Candida parapsilosis NOT DETECTED NOT DETECTED Final   Candida tropicalis NOT DETECTED NOT DETECTED Final   Cryptococcus neoformans/gattii NOT DETECTED NOT DETECTED Final   Meth resistant mecA/C and MREJ NOT DETECTED NOT DETECTED Final    Comment: Performed at [facility], [address], [place], [place] [address]  Culture, blood (routine x 2)     Status: Abnormal   Collection Time: 09/13/20 11:20 AM   Specimen: BLOOD  Result Value Ref Range Status   Specimen Description   Final    BLOOD RIGHT ANTECUBITAL Performed at [place] [address], [place], [place] [address]    Special Requests   Final    BOTTLES DRAWN AEROBIC AND ANAEROBIC Blood Culture adequate volume Performed at [place] [address], [place], [place] [address]    Culture  Setup Time   Final    IN BOTH AEROBIC AND ANAEROBIC BOTTLES GRAM POSITIVE COCCI CRITICAL VALUE NOTED.   VALUE IS CONSISTENT WITH PREVIOUSLY REPORTED AND CALLED VALUE.    Culture (A)  Final    STAPHYLOCOCCUS AUREUS SUSCEPTIBILITIES PERFORMED ON PREVIOUS CULTURE WITHIN THE LAST 5 DAYS. Performed at [facility], [place] [address] [place], [place] [address]    Report Status 09/16/2020 FINAL  Final  Resp Panel by RT-PCR (Flu A&B, Covid) Nasopharyngeal Swab     Status: None   Collection Time: 09/13/20  2:01 PM   Specimen: Nasopharyngeal Swab; Nasopharyngeal(NP) swabs in vial transport medium  Result Value Ref Range Status   SARS Coronavirus 2 by RT PCR NEGATIVE NEGATIVE Final    Comment: (NOTE) SARS-CoV-2 target nucleic acids are NOT DETECTED.  The SARS-CoV-2 RNA is generally detectable in upper respiratory specimens during the acute phase of infection. The lowest concentration of SARS-CoV-2 viral copies this assay can detect is 138 copies/mL. A negative result does not preclude SARS-Cov-2 infection and should not be used as the sole basis for treatment or other patient management decisions. A negative result may occur  with  improper specimen collection/handling, submission of specimen other than nasopharyngeal swab, presence of viral mutation(s) within the areas targeted by this assay, and inadequate number of viral copies(<138 copies/mL). A negative result must be combined with clinical observations, patient history, and epidemiological information. The expected result is Negative.  Fact Sheet for Patients:  EntrepreneurPulse.com.au  Fact Sheet for Healthcare Providers:  IncredibleEmployment.be  This test is no t yet approved or cleared by the [place] FDA and  has been authorized for detection and/or diagnosis of SARS-CoV-2 by FDA under an Emergency Use Authorization (EUA). This EUA will remain  in effect (meaning this test can be used) for the duration of the COVID-19 declaration under Section 564(b)(1) of the Act, 21 U.S.C.section  360bbb-3(b)(1), unless the authorization is terminated  or revoked sooner.       Influenza A by PCR NEGATIVE NEGATIVE Final   Influenza B by PCR NEGATIVE NEGATIVE Final    Comment: (NOTE) The Xpert Xpress SARS-CoV-2/FLU/RSV plus assay is intended as an aid in the diagnosis of influenza from Nasopharyngeal swab specimens and should not be used as a sole basis for treatment. Nasal washings and aspirates are unacceptable for Xpert Xpress SARS-CoV-2/FLU/RSV testing.  Fact Sheet for Patients: EntrepreneurPulse.com.au  Fact Sheet for Healthcare Providers: IncredibleEmployment.be  This test is not yet approved or cleared by the [place] FDA and has been authorized for detection and/or diagnosis of SARS-CoV-2 by FDA under an Emergency Use Authorization (EUA). This EUA will remain in effect (meaning this test can be used) for the duration of the COVID-19 declaration under Section 564(b)(1) of the Act, 21 U.S.C. section 360bbb-3(b)(1), unless the authorization is terminated or revoked.  Performed at [facility], [place] [address], [place], [place] [address]   Culture, blood (routine x 2)     Status: None   Collection Time: 09/14/20  9:23 AM   Specimen: BLOOD RIGHT HAND  Result Value Ref Range Status   Specimen Description   Final    BLOOD RIGHT HAND Performed at [place] [address], [place], [place] [address]    Special Requests   Final    BOTTLES DRAWN AEROBIC AND ANAEROBIC Blood Culture adequate volume Performed at [place] [address], [place], [place] [address]    Culture   Final    NO GROWTH 5 DAYS Performed at [facility], [place] [address], [place], [place] [address]    Report Status 09/19/2020 FINAL  Final  Culture, blood (routine x 2)     Status: None   Collection Time: 09/14/20  9:24 AM   Specimen: BLOOD LEFT HAND  Result Value Ref Range Status    Specimen Description   Final    BLOOD LEFT HAND Performed at [place] [address], [place], [place] [address]    Special Requests   Final    BOTTLES DRAWN AEROBIC ONLY Blood Culture adequate volume Performed at [place] [address] [place], [place] [address]    Culture   Final    NO GROWTH 5 DAYS Performed at [facility], [place] [address] [place], [place] [address]    Report Status 09/19/2020 FINAL  Final  Aerobic/Anaerobic Culture (surgical/deep wound)     Status: None   Collection Time: 09/14/20 12:37 PM   Specimen: Abscess  Result Value Ref Range Status   Specimen Description   Final    ABSCESS  LEFT RP Performed at  [facility], [place] [address] [place], [place] [address]    Special Requests   Final    Normal Performed at [facility], [place] [address], [place], [place] [address]    Gram Stain   Final    MODERATE WBC PRESENT, PREDOMINANTLY PMN ABUNDANT GRAM POSITIVE COCCI ABUNDANT GRAM NEGATIVE RODS    Culture   Final    ABUNDANT STAPHYLOCOCCUS AUREUS MODERATE BACTEROIDES ORALIS FEW PREVOTELLA BIVIA BETA LACTAMASE POSITIVE Performed at [facility], [place] [address] [place], [place] [address]    Report Status 09/19/2020 FINAL  Final   Organism ID, Bacteria STAPHYLOCOCCUS AUREUS  Final      Susceptibility   Staphylococcus aureus - MIC*    CIPROFLOXACIN <=0.5 SENSITIVE Sensitive     ERYTHROMYCIN <=0.25 SENSITIVE Sensitive     GENTAMICIN <=0.5 SENSITIVE Sensitive     OXACILLIN <=0.25 SENSITIVE Sensitive     TETRACYCLINE <=1 SENSITIVE Sensitive     VANCOMYCIN <=0.5 SENSITIVE Sensitive     TRIMETH/SULFA <=10 SENSITIVE Sensitive     CLINDAMYCIN <=0.25 SENSITIVE Sensitive     RIFAMPIN <=0.5 SENSITIVE Sensitive     Inducible Clindamycin NEGATIVE Sensitive     * ABUNDANT STAPHYLOCOCCUS AUREUS  MRSA PCR Screening     Status: None   Collection Time: 09/15/20  3:22 AM    Specimen: Nasal Mucosa; Nasopharyngeal  Result Value Ref Range Status   MRSA by PCR NEGATIVE NEGATIVE Final    Comment:        The GeneXpert MRSA Assay (FDA approved for NASAL specimens only), is one component of a comprehensive MRSA colonization surveillance program. It is not intended to diagnose MRSA infection nor to guide or monitor treatment for MRSA infections. Performed at [facility], [place] [address] [place], [place] [address]      RN Pressure Injury Documentation:     Estimated body mass index is 30.94 kg/m as calculated from the following:   Height as of this encounter: 5' 10" (1.778 m).   Weight as of this encounter: 97.8 kg.  Malnutrition Type:  Nutrition Problem: Increased nutrient needs Etiology: chronic illness  Malnutrition Characteristics:  Signs/Symptoms: estimated needs  Nutrition Interventions:  Interventions: Ensure Enlive (each supplement provides 350kcal and 20 grams of protein),MVI   Radiology Studies: CT ABDOMEN PELVIS W CONTRAST  Result Date: 09/18/2020 CLINICAL DATA:  f ollow up drain placement in Abd/pelvis and left femur EXAM: CT ABDOMEN AND PELVIS WITH CONTRAST TECHNIQUE: Multidetector CT imaging of the abdomen and pelvis was performed using the standard protocol following bolus administration of intravenous contrast. CONTRAST:  135m OMNIPAQUE IOHEXOL 300 MG/ML  SOLN COMPARISON:  CT abdomen pelvis 09/13/2020 FINDINGS: Lower chest: Bilateral lower lobe subsegmental atelectasis. 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 Slightly heterogeneous appearance of the hepatic parenchyma. No definite focal hepatic lesion; however, limited evaluation on this single-phase venous contrast study. No CT findings of calcified gallstones. No gallbladder wall thickening. No biliary ductal dilatation. Pancreas: No focal lesion. Normal pancreatic contour. No surrounding inflammatory changes. No main pancreatic  ductal dilatation. Spleen: Normal in size without focal abnormality. Adrenals/Urinary Tract: No adrenal nodule bilaterally. Bilateral kidneys enhance symmetrically. No hydronephrosis. No hydroureter. Foci of gas within the urinary bladder lumen likely related to recent instrumentation. Otherwise the urinary bladder is unremarkable. Stomach/Bowel: Stomach is within normal limits. No evidence of bowel wall thickening or dilatation. The appendix not definitely identified. Vascular/Lymphatic: The portal, splenic, superior mesenteric veins are patent. No  abdominal aorta or iliac aneurysm. Multiple prominent but nonenlarged asymmetric left inguinal lymph nodes. Similar finding of the left external iliac lymph nodes. No abdominal, pelvic, or inguinal lymphadenopathy. Reproductive: Prostate is unremarkable. Scrotal subcutaneus soft tissue edema again noted, likely slightly worsened. Other: Interval increase in small to moderate volume simple free fluid ascites. No free intraperitoneal gas. Musculoskeletal: Interval placement of a left inguinal approach surgical drain with pigtail terminating within a fluid collection associated with the left psoas muscle. The fluid collections are noted to be septated with peripheral enhancement. There has been interval decrease in size of the iliopsoas fluid collections with the largest component of the psoas muscle measuring up to 4.3 x 1.3 cm (from 6.5 x 5.6 cm and inferiorly the largest iliacus portion that appears to be septated measuring approximately 5.8 x 4.4 cm (from 8.3 x 6.7 cm). Redemonstration of proximal left thigh extensive subcutaneus soft tissue edema as well as several foci of emphysema. Associated multiple fluid collections that are peripherally enhancing. Status post interval surgical drain placement that is partially visualized. Please see separately dictated CT left femur 09/13/2020 for further details. Persistent subcutaneus soft tissue edema that is increased  compared to prior. Interval placement of skin staples overlying the left hip. Status post interval partially visualized left total hip arthroplasty. IMPRESSION: 1. Interval decrease in size of multiple multiseptated iliopsoas abscesses status post interval surgical drain placement with pigtail terminating within a fluid collection associated with the left psoas muscle. 2. Redemonstration of proximal left thigh extensive subcutaneus soft tissue edema as well as several foci of emphysema. Associated left lower extremity multiple abscesses status post partially visualized interval drain catheter. Status post interval partially visualized left total hip arthroplasty. Please see separately dictated CT left femur 09/18/2020 for further details. 3. Foci of gas within the urinary bladder lumen. Correlate with recent instrumentation. 4. Interval increase in small to moderate volume simple free fluid ascites. 5. Interval increase in diffuse subcutaneus soft tissue edema. 6. Cirrhotic morphology of the liver. Markedly limited evaluation for focal hepatic lesion on this single phase venous contrast study. Recommend non-emergent MRI hepatic liver protocol for further evaluation. 7. Please see separately dictated CT left femur 09/18/20 further details. Electronically Signed   By: Iven Finn M.D.   On: 09/18/2020 17:14   CT FEMUR LEFT W CONTRAST  Result Date: 09/18/2020 CLINICAL DATA:  Follow up left thigh abscesses following percutaneous drain placement 4 days ago. EXAM: CT OF THE LOWER LEFT EXTREMITY WITH CONTRAST TECHNIQUE: Multidetector CT imaging of the left thigh was performed according to the standard protocol following intravenous contrast administration. CONTRAST:  13m OMNIPAQUE IOHEXOL 300 MG/ML  SOLN COMPARISON:  Pelvic CT 09/13/2020. Images during drainage procedure 09/14/2020. FINDINGS: Bones/Joint/Cartilage Interval left total hip arthroplasty. The hardware appears well positioned. No evidence of acute  fracture or dislocation. There is no bone destruction. Stable mild degenerative changes of the left sacroiliac joint. No large hip joint effusion. Ligaments Suboptimally assessed by CT. Muscles and Tendons Percutaneous drain within the left psoas muscle is unchanged in position. There is no recurrent fluid collection in the psoas muscle, although there is a residual peripherally enhancing iliacus fluid collection measuring 2.4 cm transverse on image 52/5. Peripherally enhancing fluid tracks inferiorly along the iliopsoas tendon into the anterior aspect of the left thigh. There are 2 percutaneous drains within the proximal to mid left thigh. The more lateral drain is situated within a complex fluid collection within the vastus lateralis muscle which measures up to 5.3  x 4.8 cm at the level of the drain. This collection is complex with peripheral enhancement and multiple septations. It extends into the distal thigh, at least 21 cm in length on sagittal image 165/9. Some air within the collection is attributed to the drain. The more posteromedial drain is located within a complex fluid collection involving the proximal sartorius muscle. At the level of the drain, this measures approximately 11.0 x 4.1 cm on image 203/5. This fluid collection is also complex with peripheral enhancement. It may communicate with a separate peripherally enhancing fluid collection involving the right gracilis muscle. These medial collections are also quite extensive in length, extending beyond the knee, at least 40 cm in overall length (image 125/9). Based on the images obtained through the left thigh after catheter placement on 09/14/2020, the proximal component of the anteromedial collection has mildly enlarged. Soft tissues There is generalized subcutaneous edema throughout the left thigh. No unexpected foreign body. Pelvic findings are dictated separately. IMPRESSION: 1. Interval placement of left pelvic and two left thigh drains for  multiple abscesses. The pelvic components of the fluid collections have mildly improved, but there are multiple persistent large peripherally enhancing complex fluid collections within the left thigh which are incompletely drained. These are quite extensive in length, extending into the distal thigh. The proximal component of the anteromedial collection has slightly enlarged. 2. No evidence of osteomyelitis or large hip joint effusion. 3. Interval left total hip arthroplasty. The hardware appears well positioned. Electronically Signed   By: Richardean Sale M.D.   On: 09/18/2020 17:38   ECHO TEE  Result Date: 09/19/2020    TRANSESOPHOGEAL ECHO REPORT   Patient Name:   [name] Date of Exam: 09/19/2020 Medical Rec #:  [id number]         Height:       70.0 in Accession #:    [id number]        Weight:       215.6 lb Date of Birth:  01-Sep-1985         BSA:          2.155 m Patient Age:    52 years          BP:           106/66 mmHg Patient Gender: M                 HR:           104 bpm. Exam Location:  Inpatient Procedure: Transesophageal Echo, Color Doppler and Saline Contrast Bubble Study Indications:     Bacteremia  History:         Patient has prior history of Echocardiogram examinations, most                  recent 09/14/2020. Signs/Symptoms:Shortness of Breath; Risk                  Factors:Hypertension. ETOH.  Sonographer:     Clayton Lefort RDCS (AE) Referring Phys:  [place] Diagnosing Phys: Dorris Carnes MD PROCEDURE: After discussion of the risks and benefits of a TEE, an informed consent was obtained from the patient. The transesophogeal probe was passed without difficulty through the esophogus of the patient. Local oropharyngeal anesthetic was provided with Cetacaine. Sedation performed by different physician. The patient was monitored while under deep sedation. Anesthestetic sedation was provided intravenously by Anesthesiology: 227.72m of Propofol. Image quality was good. The patient  developed no complications during  the procedure. IMPRESSIONS  1. No obvious vegetations.  2. Left ventricular ejection fraction, by estimation, is 60 to 65%. The left ventricle has normal function.  3. Right ventricular systolic function is normal. The right ventricular size is normal.  4. No left atrial/left atrial appendage thrombus was detected.  5. The mitral valve is normal in structure. Trivial mitral valve regurgitation.  6. The aortic valve is normal in structure. Aortic valve regurgitation is not visualized.  7. Agitated saline contrast bubble study was negative, with no evidence of any interatrial shunt. FINDINGS  Left Ventricle: Left ventricular ejection fraction, by estimation, is 60 to 65%. The left ventricle has normal function. The left ventricular internal cavity size was normal in size. Right Ventricle: The right ventricular size is normal. Right ventricular systolic function is normal. Left Atrium: Left atrial size was normal in size. No left atrial/left atrial appendage thrombus was detected. Right Atrium: Right atrial size was normal in size. Pericardium: There is no evidence of pericardial effusion. Mitral Valve: The mitral valve is normal in structure. Trivial mitral valve regurgitation. Tricuspid Valve: The tricuspid valve is normal in structure. Tricuspid valve regurgitation is trivial. Aortic Valve: The aortic valve is normal in structure. Aortic valve regurgitation is not visualized. Pulmonic Valve: The pulmonic valve was normal in structure. Pulmonic valve regurgitation is not visualized. Aorta: The aortic root and ascending aorta are structurally normal, with no evidence of dilitation. IAS/Shunts: No atrial level shunt detected by color flow Doppler. Agitated saline contrast was given intravenously to evaluate for intracardiac shunting. Agitated saline contrast bubble study was negative, with no evidence of any interatrial shunt. Dorris Carnes MD Electronically signed by Dorris Carnes MD  Signature Date/Time: 09/19/2020/4:02:21 PM    Final    Scheduled Meds: . [MAR Hold] carvedilol  6.25 mg Oral BID WC  . [MAR Hold] Chlorhexidine Gluconate Cloth  6 each Topical Daily  . [MAR Hold] docusate sodium  100 mg Oral BID  . [MAR Hold] folic acid  1 mg Oral Daily  . [MAR Hold] lactulose  10 g Oral BID  . [MAR Hold] mouth rinse  15 mL Mouth Rinse BID  . [MAR Hold] metroNIDAZOLE  500 mg Oral Q8H  . [MAR Hold] multivitamin with minerals  1 tablet Oral Daily  . [MAR Hold] pantoprazole  40 mg Oral BID  . [MAR Hold] sertraline  25 mg Oral Daily  . [MAR Hold] sodium chloride flush  5 mL Intracatheter Q8H  . [MAR Hold] thiamine  100 mg Oral Daily   Continuous Infusions: . [MAR Hold]  ceFAZolin (ANCEF) IV 2 g (09/20/20 1427)  . lactated ringers      LOS: 7 days   Kerney Elbe, DO [facility] PAGER is on AMION  If 7PM-7AM, please contact night-coverage [email or web address]

## 2020-09-20 NOTE — Op Note (Signed)
09/13/2020 - 09/20/2020  6:12 PM  PATIENT:  Macario Golds    PRE-OPERATIVE DIAGNOSIS: Left thigh anterior and medial abscess  POST-OPERATIVE DIAGNOSIS:  Same  PROCEDURE:  1.  Left thigh excisional debridement, skin, subcutaneous tissue, muscle, fascia, deep abscess 2.  Repositioning of percutaneous anterior pigtail catheter drain 3.  Application of medium wound VAC to the left medial thigh, 15 x 6 cm  Debridement type: Excisional Debridement  Side: left  Body Location: Thigh  Tools used for debridement: scalpel, scissors and rongeur  Pre-debridement Wound size (cm):   Length: 0        width: 0     depth: 0  Post-debridement Wound size (cm):   Length: 15        width: 6     depth: 6  Debridement depth beyond dead/damaged tissue down to healthy viable tissue: yes  Tissue layer involved: skin, subcutaneous tissue, muscle / fascia  Nature of tissue removed: Necrotic, Devitalized Tissue, Non-viable tissue and Purulence  Irrigation volume: 6 L     Irrigation fluid type: Normal Saline       SURGEON:  Eulas Post, MD  PHYSICIAN ASSISTANT: Janine Ores, PA-C, present and scrubbed throughout the case, critical for completion in a timely fashion, and for retraction, instrumentation, and closure.  ANESTHESIA:   General  PREOPERATIVE INDICATIONS:  AALIJAH LANPHERE is a  35 y.o. male with a diagnosis of left leg abscess who elected for surgical management.  He has already had a left hip excisional arthroplasty, and continued to have persistent abscess formation throughout the left leg.  The risks benefits and alternatives were discussed with the patient preoperatively including but not limited to the risks of infection, bleeding, nerve injury, cardiopulmonary complications, the need for revision surgery, among others, and the patient was willing to proceed.  ESTIMATED BLOOD LOSS: 200 mL  OPERATIVE IMPLANTS: Medium sized wound VAC  OPERATIVE FINDINGS: Substantial  purulence from the medial abductor compartment, as well as some that around anteriorly.  This extended proximally quite far, even up into the pelvis from what it appeared.  OPERATIVE PROCEDURE: The patient was brought to the operating room and placed in the supine position.  General anesthesia was administered.  A Foley was administered given his scrotal swelling and inability to void.  I did have a discussion with urology given his enlarged scrotum did not feel that had there was any urologic indications for any intervention at the current time with the exception of the Foley.  A timeout was performed.  I removed one of the pigtail drains, which immediately began a flood of purulence coming from the drain hole from the medial side.  The anterior drain suture was cut, and I delivered the drain to reposition the pigtail portion of it, and was able to achieve about 75 mL of purulent material into the drain bag.  During the adjustment of the drain, the medial hole continued to have purulent material flowing, and a total of almost 500 mL came out during this portion of the case before we had even prepped and draped.  I then went to the lower extremity and did a sterile prep and drape, and made a 15 cm incision along the abductor compartment, dissection carried down to the subcutaneous tissue and into the compartment into the subfascial layer and encountered substantial purulence.  I explored the wound proximally and distally, removing necrotic material with a rongeur, as well as a sponge.  The obturator artery was visualized,  and protected.  The necrotic material was removed gently using a gauze sponge, as well as pickups, scissors, and a rongeur.  I actually delivered the suction tip proximally going up a tract of purulence, that I suspect even tracked up into the retroperitoneal space.  I explored distally and felt that I had completely evacuated the hematoma and purulent material distally.  I then  irrigated using cystoscopy tubing a total of 6 L, and then the wound appeared grossly clean, and I applied a medium wound VAC on top of a nonstick Mepitel dressing.  The patient tolerated the procedure well and there were no complications.  We will plan to keep Foley in place due to his inability to void.  After discussion with interventional radiology, we will likely plan to repeat a CAT scan on Wednesday or Thursday, and consider repeat interventional radiology later this week if there are significant fluid reaccumulations.

## 2020-09-20 NOTE — Progress Notes (Signed)
Physical Therapy Treatment Patient Details Name: [name] MRN: [id number] DOB: Apr 01, 1986 Today's Date: 09/20/2020    History of Present Illness [name] is a 35 y.o. male with medical history significant of AVN of hip ( originally was scheduled for a THA this coming March2022), HTN, GERD. Recent admission for COVID , acute renal failure and alcoholic hepatitis (08/22/20- 08/29/20). He was ok for a few days after discharge, but he began to feel very tired, weak, left thigh and leg pain and progressed over the last 2 weeks. He reports that the left thigh had become increasingly swollen and tender to touch. He has had a blister rupture on the inner thigh in the groin area. These symptoms increased through 2 days ago when he was no longer ago to walk without great pain. 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    PT Comments    Pt continues to progress toward acute PT goals and is motivated despite pain. Pt required MOD assist +2 for progression of L LE to EOB and for achieving trunk to upright position with supine to sit transfer. Pt sat EOB for total of ~15min throughout session. Pt performed sit to stand transfer with MOD assist +2 for power up to stand for ~31min and stability with use of walker. Pt's HR reached 134bpm in standing with recovery to 100's-110's with seated rest. Pt required MOD assist +2 for progression of B LEs back onto bed. PT Reviewed Rt LE therapeutic exercise with no complaints of increased pain or fatigue.  Acute therapy to follow up during stay to progress as able. Continue to recommend intense CIR follow up to improve functional mobility and  maximize independence.       Follow Up Recommendations  CIR     Equipment Recommendations  Rolling walker with 5" wheels;3in1 (PT)    Recommendations for Other Services       Precautions / Restrictions Precautions Precautions: Fall;Posterior Hip Restrictions Weight Bearing Restrictions: No LLE Weight Bearing: Weight bearing as tolerated    Mobility  Bed Mobility Overal bed mobility: Needs Assistance Bed Mobility: Supine to Sit;Sit to Supine     Supine to sit: Mod assist;+2 for physical assistance;+2 for safety/equipment Sit to supine: +2 for safety/equipment;+2 for physical assistance;Mod assist   General bed mobility comments: MOD assist +2 for safety and cues for use of UEs and assist at trunk to upright with supine to sit transfer. Pt required MAX assist for L LE progression onto/off of bed and MOD assist for Rt LE back onto bed. Pt sat EOB for total of ~25min throughout ssession. Pt was able to use UEs to pull and Rt LE to assist with pushing for +2 scoot to HOB in supine.    Transfers Overall transfer level: Needs assistance Equipment used: Standard walker Transfers: Sit to/from Stand Sit to Stand: Mod assist;+2 physical assistance;+2 safety/equipment;From elevated surface         General transfer comment: MOD assist +2 for power up to stand and stability. Pt was able to stand ~98min with MOD assist +2 for hygiene care from RN. Pt's HR remained in 120's during majority of mobility, HR reached up to 134bpm in standing and recoverd to 100's-110's. Pt unable  to take steps with Rt or L LE 2/2 pain.  Ambulation/Gait                 Stairs             Wheelchair Mobility    Modified Rankin (Stroke Patients Only)       Balance                                            Cognition Arousal/Alertness: Awake/alert Behavior During Therapy: WFL for tasks assessed/performed Overall Cognitive Status: Within Functional Limits for tasks  assessed                                        Exercises General Exercises - Lower Extremity Ankle Circles/Pumps: AROM;10 reps;Right;Supine (orange theraband resistance (resistance with plantar flexion)) Heel Slides: AROM;Right;10 reps;Supine (orange theraband resistance for leg extension)    General Comments        Pertinent Vitals/Pain Pain Assessment: Faces Faces Pain Scale: Hurts whole lot Pain Location: Lt hip and scrotal edema Pain Descriptors / Indicators: Discomfort;Grimacing;Operative site guarding Pain Intervention(s): Limited activity within patient's tolerance;Monitored during session;Repositioned    Home Living                      Prior Function            PT Goals (current goals can now be found in the care plan section) Acute Rehab PT Goals Patient Stated Goal: to be able to walk again and more independent PT Goal Formulation: With patient Time For Goal Achievement: 09/30/20 Potential to Achieve Goals: Good Progress towards PT goals: Progressing toward goals    Frequency    Min 4X/week      PT Plan Current plan remains appropriate    Co-evaluation              AM-PAC PT "6 Clicks" Mobility   Outcome Measure  Help needed turning from your back to your side while in a flat bed without using bedrails?: A Lot Help needed moving from lying on your back to sitting on the side of a flat bed without using bedrails?: A Lot Help needed moving to and from a bed to a chair (including a wheelchair)?: A Lot Help needed standing up from a chair using your arms (e.g., wheelchair or bedside chair)?: A Lot Help needed to walk in hospital room?: Total Help needed climbing 3-5 steps with a railing? : Total 6 Click Score: 10    End of Session Equipment Utilized During Treatment: Gait belt Activity Tolerance: Patient tolerated treatment well;Patient limited by pain Patient left: in bed;with call bell/phone within reach Nurse  Communication: Mobility status PT Visit Diagnosis: Other abnormalities of gait and mobility (R26.89);Muscle weakness (generalized) (M62.81);Pain Pain - Right/Left: Left Pain - part of body: Hip     Time: 1206-1237 PT Time Calculation (min) (ACUTE ONLY): 31 min  Charges:  $Therapeutic Exercise: 8-22 mins $Therapeutic Activity: 8-22 mins                    Lauren Youngblood, SPT  Acute rehab    Lauren Youngblood 09/20/2020, 3:38 PM

## 2020-09-20 NOTE — Anesthesia Preprocedure Evaluation (Signed)
Anesthesia Evaluation  Patient identified by MRN, date of birth, ID band Patient awake    Reviewed: Allergy & Precautions, H&P , NPO status , Patient's Chart, lab work & pertinent test results  Airway Mallampati: II  TM Distance: >3 FB Neck ROM: Full    Dental no notable dental hx.    Pulmonary neg pulmonary ROS, Current Smoker,    Pulmonary exam normal breath sounds clear to auscultation       Cardiovascular hypertension, Normal cardiovascular exam Rhythm:Regular Rate:Normal  Normal EF, normal valves   Neuro/Psych negative neurological ROS  negative psych ROS   GI/Hepatic negative GI ROS, (+)     substance abuse  alcohol use, Hepatitis -, Toxin Related  Endo/Other  negative endocrine ROS  Renal/GU negative Renal ROS  negative genitourinary   Musculoskeletal negative musculoskeletal ROS (+)   Abdominal   Peds negative pediatric ROS (+)  Hematology  (+) anemia ,   Anesthesia Other Findings   Reproductive/Obstetrics negative OB ROS                             Anesthesia Physical Anesthesia Plan  ASA: III  Anesthesia Plan: General   Post-op Pain Management:    Induction: Intravenous  PONV Risk Score and Plan: 2 and Ondansetron, Dexamethasone and Treatment may vary due to age or medical condition  Airway Management Planned: LMA  Additional Equipment:   Intra-op Plan:   Post-operative Plan: Extubation in OR  Informed Consent: I have reviewed the patients History and Physical, chart, labs and discussed the procedure including the risks, benefits and alternatives for the proposed anesthesia with the patient or authorized representative who has indicated his/her understanding and acceptance.     Dental advisory given  Plan Discussed with: CRNA and Surgeon  Anesthesia Plan Comments:         Anesthesia Quick Evaluation

## 2020-09-20 NOTE — Progress Notes (Addendum)
Referring Physician(s): Marguerita Merles Latif ([facility])/ Howell Rucks  Supervising Physician: Richarda Overlie  Patient Status:  [facility] - In-pt  Chief Complaint: "feels not good"  Subjective:  History of left hip septic arthritis complicated by development of left retroperitoneal and left thigh fluid collections concerning for abscesses s/p drain placement x3 (left retroperitoneal, medial left thigh, lateral left thigh) in IR 09/14/2020. S/p Left total hip arthroplasty with antibiotic impregnated cement interposition spacer and left hip excisional debridement, skin, subcutaneous tissue, muscle, bone, deep abscess on 09/15/20 by Ortho.   Patient awake and alert laying in bed. Left leg pain is worse today.   Allergies: Patient has no active allergies.  Medications: Prior to Admission medications   Medication Sig Start Date End Date Taking? Authorizing Provider  carvedilol (COREG) 3.125 MG tablet Take 3.125 mg by mouth 2 (two) times daily. 07/28/20  Yes [provider]  folic acid (FOLVITE) 1 MG tablet Take 1 tablet (1 mg total) by mouth daily. 08/30/20  Yes Pokhrel, Laxman, MD  guaiFENesin-dextromethorphan (ROBITUSSIN DM) 100-10 MG/5ML syrup Take 5 mLs by mouth every 4 (four) hours as needed for cough (chest congestion). Patient taking differently: Take 10 mLs by mouth every 4 (four) hours as needed for cough (chest congestion). 08/29/20  Yes Pokhrel, Laxman, MD  lactulose (CHRONULAC) 10 GM/15ML solution Take 15 mLs (10 g total) by mouth 2 (two) times daily. 08/29/20  Yes Pokhrel, Laxman, MD  magnesium oxide (MAG-OX) 400 MG tablet Take 1 tablet by mouth daily. 07/28/20  Yes [provider]  pantoprazole (PROTONIX) 40 MG tablet Take 1 tablet (40 mg total) by mouth 2 (two) times daily before a meal for 28 days, THEN 1 tablet (40 mg total) daily for 28 days. 08/29/20 10/24/20 Yes Pokhrel, Laxman, MD  prednisoLONE (ORAPRED) 15 MG/5ML solution Take 1.6-13.3 mLs by mouth See admin instructions. Take  13.27ml by mouth daily for 28 days, 10ml for 4 days, 6.48ml daily for 4 days, 3.3 daily for 4 days, then 1.59ml daily for 3 days. 08/29/20  Yes [provider]  sertraline (ZOLOFT) 25 MG tablet Take 25 mg by mouth at bedtime. 08/20/20  Yes [provider]  thiamine 100 MG tablet Take 1 tablet (100 mg total) by mouth daily. 08/30/20  Yes Pokhrel, Laxman, MD  buPROPion (WELLBUTRIN XL) 300 MG 24 hr tablet Take 1 tablet (300 mg total) by mouth daily. Patient not taking: No sig reported 10/16/13   Rachael Fee, MD  carbamazepine (TEGRETOL XR) 200 MG 12 hr tablet Take 1 tablet (200 mg total) by mouth at bedtime. Patient not taking: No sig reported 10/16/13   Rachael Fee, MD  hydrOXYzine (ATARAX/VISTARIL) 25 MG tablet Take 1 tablet (25 mg total) by mouth every 6 (six) hours as needed for anxiety (or CIWA score </= 10). Patient not taking: No sig reported 10/16/13   Rachael Fee, MD  lisinopril-hydrochlorothiazide (ZESTORETIC) 20-25 MG tablet Take 1 tablet by mouth daily. Patient not taking: No sig reported 08/31/20   Pokhrel, Rebekah [place], MD  oxyCODONE (ROXICODONE) 5 MG immediate release tablet Take 1 tablet (5 mg total) by mouth every 8 (eight) hours as needed. Patient not taking: No sig reported 08/29/20 08/29/21  Pokhrel, Rebekah [place], MD  prednisoLONE 5 MG TABS tablet Take 8 tablets (40 mg total) by mouth daily for 28 days, THEN 6 tablets (30 mg total) daily for 4 days, THEN 4 tablets (20 mg total) daily for 4 days, THEN 2 tablets (10 mg total) daily for  4 days, THEN 1 tablet (5 mg total) every 3 (three) days for 1 day. Patient not taking: Reported on 09/13/2020 08/29/20 10/09/20  Joycelyn Das, MD  traMADol (ULTRAM) 50 MG tablet Take 1 tablet (50 mg total) by mouth every 6 (six) hours as needed. Patient not taking: Reported on 09/13/2020 08/29/20   Joycelyn Das, MD     Vital Signs: BP (!) 118/57   Pulse (!) 106   Temp 99.1 F (37.3 C) (Oral)   Resp (!) 21   Ht  (1.778 m)   Wt 215 lb 9.8 oz  (97.8 kg)   SpO2 92%   BMI 30.94 kg/m   Physical Exam Vitals and nursing note reviewed.  Constitutional:      General: He is not in acute distress. Pulmonary:     Effort: Pulmonary effort is normal. No respiratory distress.  Musculoskeletal:     Comments: Drain #1 (exit site medial left hip): no output. Flushes well.  Drain #2 (exit site lateral left hip) : trace serosanguinous output in gravity bag. Flushes easily.  Drain #3 (exit site left hip/groin): 95 cc dark brown output in gravity bag. Output appears darker and more turid compare to yesterday. Flushes easily.    Skin:    General: Skin is warm and dry.  Neurological:     Mental Status: He is alert and oriented to person, place, and time.     Imaging: CT ABDOMEN PELVIS W CONTRAST  Result Date: 09/18/2020 CLINICAL DATA:  f ollow up drain placement in Abd/pelvis and left femur EXAM: CT ABDOMEN AND PELVIS WITH CONTRAST TECHNIQUE: Multidetector CT imaging of the abdomen and pelvis was performed using the standard protocol following bolus administration of intravenous contrast. CONTRAST:  OMNIPAQUE IOHEXOL 300 MG/ML  SOLN COMPARISON:  CT abdomen pelvis 09/13/2020 FINDINGS: Lower chest: Bilateral lower lobe subsegmental atelectasis. 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 Slightly heterogeneous appearance of the hepatic parenchyma. No definite focal hepatic lesion; however, limited evaluation on this single-phase venous contrast study. No CT findings of calcified gallstones. No gallbladder wall thickening. No biliary ductal dilatation. Pancreas: No focal lesion. Normal pancreatic contour. No surrounding inflammatory changes. No main pancreatic ductal dilatation. Spleen: Normal in size without focal abnormality. Adrenals/Urinary Tract: No adrenal nodule bilaterally. Bilateral kidneys enhance symmetrically. No hydronephrosis. No hydroureter. Foci of gas within the urinary bladder lumen likely related  to recent instrumentation. Otherwise the urinary bladder is unremarkable. Stomach/Bowel: Stomach is within normal limits. No evidence of bowel wall thickening or dilatation. The appendix not definitely identified. Vascular/Lymphatic: The portal, splenic, superior mesenteric veins are patent. No abdominal aorta or iliac aneurysm. Multiple prominent but nonenlarged asymmetric left inguinal lymph nodes. Similar finding of the left external iliac lymph nodes. No abdominal, pelvic, or inguinal lymphadenopathy. Reproductive: Prostate is unremarkable. Scrotal subcutaneus soft tissue edema again noted, likely slightly worsened. Other: Interval increase in small to moderate volume simple free fluid ascites. No free intraperitoneal gas. Musculoskeletal: Interval placement of a left inguinal approach surgical drain with pigtail terminating within a fluid collection associated with the left psoas muscle. The fluid collections are noted to be septated with peripheral enhancement. There has been interval decrease in size of the iliopsoas fluid collections with the largest component of the psoas muscle measuring up to 4.3 x 1.3 cm (from 6.5 x 5.6 cm and inferiorly the largest iliacus portion that appears to be septated measuring approximately 5.8 x 4.4 cm (from 8.3 x 6.7 cm). Redemonstration of proximal left  thigh extensive subcutaneus soft tissue edema as well as several foci of emphysema. Associated multiple fluid collections that are peripherally enhancing. Status post interval surgical drain placement that is partially visualized. Please see separately dictated CT left femur 09/13/2020 for further details. Persistent subcutaneus soft tissue edema that is increased compared to prior. Interval placement of skin staples overlying the left hip. Status post interval partially visualized left total hip arthroplasty. IMPRESSION: 1. Interval decrease in size of multiple multiseptated iliopsoas abscesses status post interval surgical  drain placement with pigtail terminating within a fluid collection associated with the left psoas muscle. 2. Redemonstration of proximal left thigh extensive subcutaneus soft tissue edema as well as several foci of emphysema. Associated left lower extremity multiple abscesses status post partially visualized interval drain catheter. Status post interval partially visualized left total hip arthroplasty. Please see separately dictated CT left femur 09/18/2020 for further details. 3. Foci of gas within the urinary bladder lumen. Correlate with recent instrumentation. 4. Interval increase in small to moderate volume simple free fluid ascites. 5. Interval increase in diffuse subcutaneus soft tissue edema. 6. Cirrhotic morphology of the liver. Markedly limited evaluation for focal hepatic lesion on this single phase venous contrast study. Recommend non-emergent MRI hepatic liver protocol for further evaluation. 7. Please see separately dictated CT left femur 09/18/20 further details. Electronically Signed   By: Tish FredericksonMorgane  Naveau M.D.   On: 09/18/2020 17:14   CT FEMUR LEFT W CONTRAST  Result Date: 09/18/2020 CLINICAL DATA:  Follow up left thigh abscesses following percutaneous drain placement 4 days ago. EXAM: CT OF THE LOWER LEFT EXTREMITY WITH CONTRAST TECHNIQUE: Multidetector CT imaging of the left thigh was performed according to the standard protocol following intravenous contrast administration. CONTRAST:  100mL OMNIPAQUE IOHEXOL 300 MG/ML  SOLN COMPARISON:  Pelvic CT 09/13/2020. Images during drainage procedure 09/14/2020. FINDINGS: Bones/Joint/Cartilage Interval left total hip arthroplasty. The hardware appears well positioned. No evidence of acute fracture or dislocation. There is no bone destruction. Stable mild degenerative changes of the left sacroiliac joint. No large hip joint effusion. Ligaments Suboptimally assessed by CT. Muscles and Tendons Percutaneous drain within the left psoas muscle is unchanged in  position. There is no recurrent fluid collection in the psoas muscle, although there is a residual peripherally enhancing iliacus fluid collection measuring 2.4 cm transverse on image 52/5. Peripherally enhancing fluid tracks inferiorly along the iliopsoas tendon into the anterior aspect of the left thigh. There are 2 percutaneous drains within the proximal to mid left thigh. The more lateral drain is situated within a complex fluid collection within the vastus lateralis muscle which measures up to 5.3 x 4.8 cm at the level of the drain. This collection is complex with peripheral enhancement and multiple septations. It extends into the distal thigh, at least 21 cm in length on sagittal image 165/9. Some air within the collection is attributed to the drain. The more posteromedial drain is located within a complex fluid collection involving the proximal sartorius muscle. At the level of the drain, this measures approximately 11.0 x 4.1 cm on image 203/5. This fluid collection is also complex with peripheral enhancement. It may communicate with a separate peripherally enhancing fluid collection involving the right gracilis muscle. These medial collections are also quite extensive in length, extending beyond the knee, at least 40 cm in overall length (image 125/9). Based on the images obtained through the left thigh after catheter placement on 09/14/2020, the proximal component of the anteromedial collection has mildly enlarged. Soft tissues There is  generalized subcutaneous edema throughout the left thigh. No unexpected foreign body. Pelvic findings are dictated separately. IMPRESSION: 1. Interval placement of left pelvic and two left thigh drains for multiple abscesses. The pelvic components of the fluid collections have mildly improved, but there are multiple persistent large peripherally enhancing complex fluid collections within the left thigh which are incompletely drained. These are quite extensive in length,  extending into the distal thigh. The proximal component of the anteromedial collection has slightly enlarged. 2. No evidence of osteomyelitis or large hip joint effusion. 3. Interval left total hip arthroplasty. The hardware appears well positioned. Electronically Signed   By: Carey Bullocks M.D.   On: 09/18/2020 17:38   ECHO TEE  Result Date: 09/19/2020    TRANSESOPHOGEAL ECHO REPORT   Patient Name:   [name] Date of Exam: 09/19/2020 Medical Rec #:  [id number]         Height:       70.0 in Accession #:    [id number]        Weight:       215.6 lb Date of Birth:  [birth year]         BSA:          2.155 m Patient Age:    34 years          BP:           106/66 mmHg Patient Gender: M                 HR:           104 bpm. Exam Location:  Inpatient Procedure: Transesophageal Echo, Color Doppler and Saline Contrast Bubble Study Indications:     Bacteremia  History:         Patient has prior history of Echocardiogram examinations, most                  recent 09/14/2020. Signs/Symptoms:Shortness of Breath; Risk                  Factors:Hypertension. ETOH.  Sonographer:     Ross Ludwig RDCS (AE) Referring Phys:  [address] [place] Diagnosing Phys: Dietrich Pates MD PROCEDURE: After discussion of the risks and benefits of a TEE, an informed consent was obtained from the patient. The transesophogeal probe was passed without difficulty through the esophogus of the patient. Local oropharyngeal anesthetic was provided with Cetacaine. Sedation performed by different physician. The patient was monitored while under deep sedation. Anesthestetic sedation was provided intravenously by Anesthesiology: 227.23mg  of Propofol. Image quality was good. The patient developed no complications during the procedure. IMPRESSIONS  1. No obvious vegetations.  2. Left ventricular ejection fraction, by estimation, is 60 to 65%. The left ventricle has normal function.  3. Right ventricular systolic function is normal. The right ventricular size  is normal.  4. No left atrial/left atrial appendage thrombus was detected.  5. The mitral valve is normal in structure. Trivial mitral valve regurgitation.  6. The aortic valve is normal in structure. Aortic valve regurgitation is not visualized.  7. Agitated saline contrast bubble study was negative, with no evidence of any interatrial shunt. FINDINGS  Left Ventricle: Left ventricular ejection fraction, by estimation, is 60 to 65%. The left ventricle has normal function. The left ventricular internal cavity size was normal in size. Right Ventricle: The right ventricular size is normal. Right ventricular systolic function is normal. Left Atrium: Left atrial size was normal in size. No left atrial/left atrial  appendage thrombus was detected. Right Atrium: Right atrial size was normal in size. Pericardium: There is no evidence of pericardial effusion. Mitral Valve: The mitral valve is normal in structure. Trivial mitral valve regurgitation. Tricuspid Valve: The tricuspid valve is normal in structure. Tricuspid valve regurgitation is trivial. Aortic Valve: The aortic valve is normal in structure. Aortic valve regurgitation is not visualized. Pulmonic Valve: The pulmonic valve was normal in structure. Pulmonic valve regurgitation is not visualized. Aorta: The aortic root and ascending aorta are structurally normal, with no evidence of dilitation. IAS/Shunts: No atrial level shunt detected by color flow Doppler. Agitated saline contrast was given intravenously to evaluate for intracardiac shunting. Agitated saline contrast bubble study was negative, with no evidence of any interatrial shunt. Dietrich Pates MD Electronically signed by Dietrich Pates MD Signature Date/Time: 09/19/2020/4:02:21 PM    Final    US SCROTUM W/DOPPLER  Result Date: 09/17/2020 CLINICAL DATA:  Left scrotal pain and swelling for several days. EXAM: SCROTAL ULTRASOUND DOPPLER ULTRASOUND OF THE TESTICLES TECHNIQUE: Complete ultrasound examination of the  testicles, epididymis, and other scrotal structures was performed. Color and spectral Doppler ultrasound were also utilized to evaluate blood flow to the testicles. COMPARISON:  CT on 09/13/2020 FINDINGS: Right testicle Measurements: 3.2 x 1.8 x 2.4 cm. No mass or microlithiasis visualized. Left testicle Measurements: 3.4 x 2.1 x 2.0 cm. No mass or microlithiasis visualized. Right epididymis:  Normal in size and appearance. Left epididymis:  Normal in size and appearance. Hydrocele:  None visualized. Varicocele:  None visualized. Pulsed Doppler interrogation of both testes demonstrates normal low resistance arterial and venous waveforms bilaterally. Marked asymmetric left scrotal wall thickening and fluid is seen, with increased blood flow on color Doppler ultrasound. This appears to be part of the much more widespread process throughout the left pelvic and thigh soft tissues seen, as demonstrated on prior CT. IMPRESSION: No evidence of testicular mass or torsion. Marked asymmetric left scrotal wall thickening and fluid, with increased blood flow. This is likely due to soft tissue infection, and part of the much more widespread process in the left pelvis and thigh demonstrated on prior CT. Electronically Signed   By: Danae Orleans M.D.   On: 09/17/2020 18:47    Labs:  CBC: Recent Labs    09/17/20 0511 09/18/20 0303 09/19/20 0305 09/20/20 0244  WBC 12.1* 15.1* 14.5* 10.7*  HGB 7.5* 9.8* 8.8* 7.7*  HCT 22.5* 30.2* 26.6* 24.0*  PLT 72* 86* 85* 99*    COAGS: Recent Labs    08/22/20 1817 08/25/20 0349 09/13/20 1045 09/14/20 0525 09/15/20 2300 09/19/20 0305  INR 1.4*   < > 1.4* 1.5* 2.2* 1.5*  APTT 42*  --   --   --   --   --    < > = values in this interval not displayed.    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  Patient is going for I/D of the medial and lateral thigh to express these fluid collections with ortho.  Left medial thigh drain (#1) stable with no output in gravity bag (0 output from drain in past 24 hours per chart). Left lateral thigh drain (#2) stable with trace serosanginous output in gravity bag (additional 35cc output from drain in past 24 hours per chart). Left retroperitoneal drain (#3) stable with approximately 95 cc of dark brown purulent output in gravity bag (additional 110 cc output from drain in past 24 hours per chart).  Afebrile, WBC 10.7, trending down.   Continue current drain management- continue Qshift flushes/monitor of output. Obtain repeat CT when output <10 cc/day (assess for possible removal). Further plans per [facility]/orthopedic surgery/ID- appreciate and agree with management. IR to follow.   Electronically Signed: Willette Brace, PA-C 09/20/2020, 11:40 AM   I spent a total of 15 Minutes at the the patient's bedside AND on the patient's hospital floor or unit, greater than 50% of which was counseling/coordinating care for left retroperitoneal and left thigh fluid collections s/p drain placement x3.

## 2020-09-21 ENCOUNTER — Inpatient Hospital Stay (HOSPITAL_COMMUNITY): Payer: 59

## 2020-09-21 DIAGNOSIS — B9561 Methicillin susceptible Staphylococcus aureus infection as the cause of diseases classified elsewhere: Secondary | ICD-10-CM | POA: Diagnosis not present

## 2020-09-21 DIAGNOSIS — L02416 Cutaneous abscess of left lower limb: Secondary | ICD-10-CM | POA: Diagnosis not present

## 2020-09-21 DIAGNOSIS — K7010 Alcoholic hepatitis without ascites: Secondary | ICD-10-CM | POA: Diagnosis not present

## 2020-09-21 DIAGNOSIS — R7881 Bacteremia: Secondary | ICD-10-CM | POA: Diagnosis not present

## 2020-09-21 DIAGNOSIS — K7030 Alcoholic cirrhosis of liver without ascites: Secondary | ICD-10-CM | POA: Diagnosis not present

## 2020-09-21 DIAGNOSIS — F1029 Alcohol dependence with unspecified alcohol-induced disorder: Secondary | ICD-10-CM | POA: Diagnosis not present

## 2020-09-21 LAB — CBC WITH DIFFERENTIAL/PLATELET
Abs Immature Granulocytes: 0.43 10*3/uL — ABNORMAL HIGH (ref 0.00–0.07)
Basophils Absolute: 0 10*3/uL (ref 0.0–0.1)
Basophils Relative: 0 %
Eosinophils Absolute: 0 10*3/uL (ref 0.0–0.5)
Eosinophils Relative: 0 %
HCT: 20.3 % — ABNORMAL LOW (ref 39.0–52.0)
Hemoglobin: 6.4 g/dL — CL (ref 13.0–17.0)
Immature Granulocytes: 4 %
Lymphocytes Relative: 12 %
Lymphs Abs: 1.4 10*3/uL (ref 0.7–4.0)
MCH: 32.2 pg (ref 26.0–34.0)
MCHC: 31.5 g/dL (ref 30.0–36.0)
MCV: 102 fL — ABNORMAL HIGH (ref 80.0–100.0)
Monocytes Absolute: 0.3 10*3/uL (ref 0.1–1.0)
Monocytes Relative: 2 %
Neutro Abs: 9.8 10*3/uL — ABNORMAL HIGH (ref 1.7–7.7)
Neutrophils Relative %: 82 %
Platelets: 104 10*3/uL — ABNORMAL LOW (ref 150–400)
RBC: 1.99 MIL/uL — ABNORMAL LOW (ref 4.22–5.81)
RDW: 17.2 % — ABNORMAL HIGH (ref 11.5–15.5)
WBC: 11.9 10*3/uL — ABNORMAL HIGH (ref 4.0–10.5)
nRBC: 0 % (ref 0.0–0.2)

## 2020-09-21 LAB — MAGNESIUM: Magnesium: 1.7 mg/dL (ref 1.7–2.4)

## 2020-09-21 LAB — COMPREHENSIVE METABOLIC PANEL
ALT: 11 U/L (ref 0–44)
AST: 30 U/L (ref 15–41)
Albumin: 1.7 g/dL — ABNORMAL LOW (ref 3.5–5.0)
Alkaline Phosphatase: 214 U/L — ABNORMAL HIGH (ref 38–126)
Anion gap: 8 (ref 5–15)
BUN: 10 mg/dL (ref 6–20)
CO2: 23 mmol/L (ref 22–32)
Calcium: 7.1 mg/dL — ABNORMAL LOW (ref 8.9–10.3)
Chloride: 95 mmol/L — ABNORMAL LOW (ref 98–111)
Creatinine, Ser: 0.34 mg/dL — ABNORMAL LOW (ref 0.61–1.24)
GFR, Estimated: >60 mL/min (ref >60)
Glucose, Bld: 173 mg/dL — ABNORMAL HIGH (ref 70–99)
Potassium: 4.8 mmol/L (ref 3.5–5.1)
Sodium: 126 mmol/L — ABNORMAL LOW (ref 135–145)
Total Bilirubin: 1.5 mg/dL — ABNORMAL HIGH (ref 0.3–1.2)
Total Protein: 4.8 g/dL — ABNORMAL LOW (ref 6.5–8.1)

## 2020-09-21 LAB — PREPARE RBC (CROSSMATCH)

## 2020-09-21 LAB — PHOSPHORUS: Phosphorus: 3.1 mg/dL (ref 2.5–4.6)

## 2020-09-21 LAB — HEMOGLOBIN AND HEMATOCRIT, BLOOD
HCT: 23.5 % — ABNORMAL LOW (ref 39.0–52.0)
Hemoglobin: 7.8 g/dL — ABNORMAL LOW (ref 13.0–17.0)

## 2020-09-21 MED ORDER — JUVEN PO PACK
1.0000 | PACK | Freq: Two times a day (BID) | ORAL | Status: DC
  Administered 2020-09-21 – 2020-10-10 (×31): 1 via ORAL
  Filled 2020-09-21 (×35): qty 1

## 2020-09-21 MED ORDER — BOOST / RESOURCE BREEZE PO LIQD CUSTOM
1.0000 | Freq: Two times a day (BID) | ORAL | Status: DC
  Administered 2020-09-21 – 2020-10-09 (×12): 1 via ORAL

## 2020-09-21 MED ORDER — PROSOURCE PLUS PO LIQD
30.0000 mL | Freq: Two times a day (BID) | ORAL | Status: DC
  Administered 2020-09-21 – 2020-10-10 (×29): 30 mL via ORAL
  Filled 2020-09-21 (×30): qty 30

## 2020-09-21 MED ORDER — IOHEXOL 300 MG/ML  SOLN
100.0000 mL | Freq: Once | INTRAMUSCULAR | Status: AC | PRN
  Administered 2020-09-21: 100 mL via INTRAVENOUS

## 2020-09-21 MED ORDER — SODIUM CHLORIDE 0.9% IV SOLUTION: Freq: Once | INTRAVENOUS | Status: DC

## 2020-09-21 MED ORDER — MAGNESIUM SULFATE 2 GM/50ML IV SOLN
2.0000 g | Freq: Once | INTRAVENOUS | Status: AC
  Administered 2020-09-21: 2 g via INTRAVENOUS
  Filled 2020-09-21: qty 50

## 2020-09-21 NOTE — Progress Notes (Signed)
PROGRESS NOTE    [name]  GYK:[id number] DOB: [birth year] DOA: 09/13/2020 PCP: [name], PA   Brief Narrative:   Patient is a 35 yo male with past medical history significant for AVN of the hip, HTN, and GERD.  Patient was recently admitted on 08/22/20 for COVID-19 and was discharge on 08/29/20. About two days post discharge, patient began to feel extremely weak and tired with symptoms progressively worsening over the next two weeks.  During this time. He also developed left leg thigh and legs pain along with swelling and tenderness to touch.  He also developed fluid filled blisters and skin sloughing on the medial portion of his left thigh extending into his left groin.  Symptoms escalated in intensity until his was unable to walk without significant pain.  Patient saw his PCP on 09/12/20 for a lab draw and was called by his PCP office to go to the ED immediately.  ED Course: Hgb 6.2 with 2U PRBC's given, FOBT negative. CT abdomen showed left iliopsoas and left gluteal abscesses w/ diffuse swelling and left hip effusion. CCS was consulted. Patient was started on cefazolin and vanc.  Interim History: Blood cultures positive for MSSA bacteremia with ID consulted, repeat blood cultures, and transesophageal echo obtained. He underwent 3 drain placement by IR for abscesses in his iliopsoas muscle and multiple abscesses in the legs.  On 09/15/20, he had a left hip Interposition arthoplasty with antibiotic spacer placement and debridement and developed decompensated septic shock with refractory hypotension despite 2 unit PRBCs, 2.2 L of crystalloid and 500 mL of albumin.  He was admitted to the ICU with vasopressor support.  He is now hemodynamically stable on [facility] service.     Assessment & Plan:   Principal Problem:   MSSA bacteremia Active Problems:   Alcohol dependence ([place])   Alcoholic hepatitis   Sepsis ([place])   Septic arthritis ([place])   Iliopsoas abscess on left [facility])   #MSSA  bacteremia/ Left hip septic arthritis with iliopsoas, left gluteal, and left medial thigh abscess/ Left hip effusion and septic arthritis in the setting of avascular necrosis -Initially started on IV vanc, cefepime; pharmacy to dose but changed to IV Cefazolin by ID -Initial Blood Cx 09/13/20 showed Staph Aureus in Both sets of Cx's, transthoracic echo negative for vegetation, normal valvular and systolic function  -Fluids had initially stopped but then he received 2.2 crystalloid boluses as well as 2 units of PRBCs postoperatively -Sepsis Criteria met with: tachycardia, tachypnea, lactic acid 3.3, and Leukocytosis was 10.6 source: abscesses as above as well as bacteremia and went into septic shock during this admission -Repeat blood cultures negative, continue course of cefazolin per ID. -09/19/20 TEE showed the LVEF and RVEF were normal with normal valves and no evidence of vegetation. - Wound cultures positive for S. Aureus, bacteroides, Prevotella - WBC's trending down to 11.9 from max of 15.1, afebrile - Continues to have pain but less severe, on day 4 metridazole, day 9 cefazolin - ID considering possible PICC placement given prolonged 6 week course of IV cefazolin -2/22 CT Abdomen shows interval development of innumerable fluid collections are seen within the musculature of the left ileopsoas muscle as well as the musculature of the visualized portion of the left gluteal region and proximal left thigh consistent with innumerable abscesses - 2/24 left hip total arthroplasty with cement interposition spacer  - 3/1 left thigh irrigation and debridement with wound vac placement- post op day 1 - Surgery plans for left groin  I/D tomorrow 3/3 by Dr. Brantley Stage - Per IR, repeat CT scan on 3/3 with possible drain placement on 09/23/20  #Scrotal edema/anasarca in setting of IVF resuscitation, MSSA bacteremia, sepsis, and hepatic cirrhosis -Scrotal edema, and left groin and leg swelling persists -Scrotal  U/S Marked asymmetric left scrotal wall thickening and fluid, with increased blood flow. Which is consistent with soft tissue infection. -IVF stopped with IV antibiotics as above and supportive measures such as extremity and scrotal elevation  #Symptommatic anemia/macrocytic anemia -On admission H/H 6.2/18.9 s/p 5 U PRBC's total this admission, currently 6.4/20.3, asymptomatic, FOBC negative -Anemia panel: iron level of 44, U IBC 131, TIBC 175, saturation ratios of 25% -Approximately 300 ml sanguinous drainage from wound vac -Transfused 1U today 09/21/20 for Hgb < 7, trend H/H post transfusion  #Sepsis/Metabolic Acidosis/Electrolyte imbalances (Hyponatremia and Hypophosphatemia) -Sepsis and metabolic acidosis resolved, anion gap closed, d/c IVF due to anasarca -Patient's phosphorus level is 2.1, following repletion on 3/1 with IV Sodium Phos 20 mmol  -Sodium is now 126, down from 129 yesterday, replete with Sodium Phos IV, and further diurese with Lasix  Hx of Alcoholism/ Alcoholic Hepatitis/Hepatic Cirrhosis, Hyperbilirubinemia/ Mildly abnormal AST -2/22 CT Abdomen consistent with hepatic cirrhosis -Initially placed on prednisolone, 49m through 09/26/20, followed by a taper. Per GI given  disseminated MSSA bacteremia taper discontinued -Per patient drinks 12-18 beers weekly -Liver panel: total Bili 1.5 improved from peak of 3.2, albumin 1.7, alk phos 214, AST 46->30,  ammonia 45 -Continue lactulose 10 g p.o. twice daily, thiamine, magnesium -Patient and family report he has been alcohol free for 1 month, counsel on continued cessation  #Difficulty voiding rt fluid volume overload -Patient current has Foley and was diuresed with 40 mg IV Lasix over the past several days -Voiding trial to d/c Foley  #Tachycardia, hypertension, currently sinus tachy rate 100-110 -Continue Carvedilol 3.125 mg p.o. twice daily   # Pain control -Continue oxycodone prn  # Protein Malnutrition, mild -Per  dietitian, patient's girlfriend to bring in [place] shakes which he was consuming PTA  #Thrombocytopenia, in setting of cirrhosis -No s/s of bleeding Plts 104,000 down from 170,000  #GAD -Continue sertraline 25 mg p.o. nightly  #GERD -C/w Pantoprazole 40 mg p.o. twice daily   DVT prophylaxis: SCD's Code Status: Full Family Communication: Girlfriend at bedside  Status is: Inpatient   Dispo: The patient is from: Home              Anticipated d/c is to: 09/24/20              Patient currently is not medically stable for discharge   Difficult to place patient: No   Body mass index is 29.7 kg/m.   Consultants:   General Surgery  Orthopedic Surgery  Infectious Diseases  Interventional Radiology  Urology  Procedures:  -2/24 left hip total arthroplasty with cement interposition spacer  -3/1 left thigh irrigation and debridement with wound vac placement, post op day 1  Antimicrobials:   Ancef IV  Metronidazole po  Subjective:  Patient reports overall decreased pain in his groin and scrotum but still is having weeping and edema from this area. Overall pain is well controlled.  Examination:  General exam: Appears calm and comfortable  Respiratory system: Clear to auscultation. Respiratory effort normal. Cardiovascular system: S1 & S2 heard, RRR. No JVD, murmurs, rubs, gallops or clicks. No pedal edema. Central nervous system: Alert and oriented. No focal neurological deficits. Gastrointestinal system: Abdomen is nondistended, soft and nontender. No organomegaly  or masses felt. Normal bowel sounds heard. Abdomen: Anasarca present without fluid wave. Has lower extremity drains from his left hip with 2 drains and one wound vac. Dressing site for drains clean and dry, wound vac 300 cc of sanguineus drainage. Extremities: Left lower leg is edematous with swelling, redness, and blistering of medial aspect of left thigh into left groin and scrotum. GU: Scrotal  edema and left groin blistering with adjacent area of left thigh also affected. Perineal area and right buttock also has erythema. Skin: See GU and extremities above. Psychiatry: Judgement and insight appear normal. Mood & affect appropriate.     Objective: Vitals:   09/21/20 0933 09/21/20 1000 09/21/20 1100 09/21/20 1200  BP: 121/83 (!) 131/92 116/81 123/74  Pulse: (!) 103 (!) 101 (!) 104 (!) 103  Resp: (!) 21 19 17 14   Temp: 65.7 F (36.9 C)   97.6 F (36.4 C)  TempSrc: Oral   Oral  SpO2: 99% 100% 97% 98%  Weight:      Height:        Intake/Output Summary (Last 24 hours) at 09/21/2020 1256 Last data filed at 09/21/2020 1136 Gross per 24 hour  Intake 2003.88 ml  Output 1412 ml  Net 591.88 ml   Filed Weights   09/19/20 0500 09/19/20 0843 09/21/20 0500  Weight: 91.8 kg 97.8 kg 93.9 kg     Data Reviewed:   CBC: Recent Labs  Lab 09/17/20 0511 09/18/20 0303 09/19/20 0305 09/20/20 0244 09/21/20 0234 09/21/20 1117  WBC 12.1* 15.1* 14.5* 10.7* 11.9*  --   NEUTROABS 9.6* 12.2* 11.8* 8.3* 9.8*  --   HGB 7.5* 9.8* 8.8* 7.7* 6.4* 7.8*  HCT 22.5* 30.2* 26.6* 24.0* 20.3* 23.5*  MCV 96.6 97.7 98.5 100.0 102.0*  --   PLT 72* 86* 85* 99* 104*  --    Basic Metabolic Panel: Recent Labs  Lab 09/17/20 0511 09/18/20 0303 09/19/20 0305 09/20/20 0244 09/21/20 0234  NA 131* 129* 129* 131* 126*  K 4.3 4.4 4.4 4.3 4.8  CL 102 98 96* 95* 95*  CO2 22 23 24 23 23   GLUCOSE 127* 91 100* 96 173*  BUN 13 10 14 13 10   CREATININE 0.38* 0.44* 0.41* 0.43* 0.34*  CALCIUM 7.2* 7.5* 7.4* 7.4* 7.1*  MG 2.0 1.9 1.9 1.9 1.7  PHOS 1.6* 1.6* 3.4 2.7 3.1   GFR: Estimated Creatinine Clearance: 147 mL/min (A) (by C-G formula based on SCr of 0.34 mg/dL (L)). Liver Function Tests: Recent Labs  Lab 09/17/20 0511 09/18/20 0303 09/19/20 0305 09/20/20 0244 09/21/20 0234  AST 43* 61* 39 30 30  ALT 16 23 16 13 11   ALKPHOS 165* 270* 244* 240* 214*  BILITOT 1.3* 1.7* 1.7* 1.6* 1.5*  PROT 4.9*  5.7* 5.2* 5.2* 4.8*  ALBUMIN 2.0* 2.3* 2.0* 1.9* 1.7*   No results for input(s): LIPASE, AMYLASE in the last 168 hours. No results for input(s): AMMONIA in the last 168 hours. Coagulation Profile: Recent Labs  Lab 09/15/20 2300 09/19/20 0305  INR 2.2* 1.5*   Cardiac Enzymes: No results for input(s): CKTOTAL, CKMB, CKMBINDEX, TROPONINI in the last 168 hours. BNP (last 3 results) No results for input(s): PROBNP in the last 8760 hours. HbA1C: No results for input(s): HGBA1C in the last 72 hours. CBG: Recent Labs  Lab 09/15/20 2116 09/15/20 2152  GLUCAP 61* 86   Lipid Profile: No results for input(s): CHOL, HDL, LDLCALC, TRIG, CHOLHDL, LDLDIRECT in the last 72 hours. Thyroid Function Tests: No results for input(s):  TSH, T4TOTAL, FREET4, T3FREE, THYROIDAB in the last 72 hours. Anemia Panel: No results for input(s): VITAMINB12, FOLATE, FERRITIN, TIBC, IRON, RETICCTPCT in the last 72 hours. Sepsis Labs: Recent Labs  Lab 09/15/20 2052 09/15/20 2300 09/18/20 0843 09/18/20 1105 09/19/20 0305 09/20/20 0244  PROCALCITON  --  21.56 7.78  --  4.14 2.02  LATICACIDVEN 5.0* 5.3* 2.8* 3.1*  --   --     Recent Results (from the past 240 hour(s))  Culture, blood (routine x 2)     Status: Abnormal   Collection Time: 09/13/20 10:45 AM   Specimen: BLOOD  Result Value Ref Range Status   Specimen Description   Final    BLOOD RIGHT ANTECUBITAL Performed at [facility], [place] [address], [place], [place] [address]    Special Requests   Final    BOTTLES DRAWN AEROBIC AND ANAEROBIC Blood Culture adequate volume Performed at [place] [address], [place], [place] [address]    Culture  Setup Time   Final    IN BOTH AEROBIC AND ANAEROBIC BOTTLES GRAM POSITIVE COCCI CRITICAL RESULT CALLED TO, READ BACK BY AND VERIFIED WITHSeleta Rhymes [facility] 09/13/20 0347 JDW Performed at [facility], [place] [address], [place], [place] [address]    Culture  STAPHYLOCOCCUS AUREUS (A)  Final   Report Status 09/16/2020 FINAL  Final   Organism ID, Bacteria STAPHYLOCOCCUS AUREUS  Final      Susceptibility   Staphylococcus aureus - MIC*    CIPROFLOXACIN <=0.5 SENSITIVE Sensitive     ERYTHROMYCIN <=0.25 SENSITIVE Sensitive     GENTAMICIN <=0.5 SENSITIVE Sensitive     OXACILLIN <=0.25 SENSITIVE Sensitive     TETRACYCLINE <=1 SENSITIVE Sensitive     VANCOMYCIN <=0.5 SENSITIVE Sensitive     TRIMETH/SULFA <=10 SENSITIVE Sensitive     CLINDAMYCIN <=0.25 SENSITIVE Sensitive     RIFAMPIN <=0.5 SENSITIVE Sensitive     Inducible Clindamycin NEGATIVE Sensitive     * STAPHYLOCOCCUS AUREUS  Blood Culture ID Panel (Reflexed)     Status: Abnormal   Collection Time: 09/13/20 10:45 AM  Result Value Ref Range Status   Enterococcus faecalis NOT DETECTED NOT DETECTED Final   Enterococcus Faecium NOT DETECTED NOT DETECTED Final   Listeria monocytogenes NOT DETECTED NOT DETECTED Final   Staphylococcus species DETECTED (A) NOT DETECTED Final    Comment: CRITICAL RESULT CALLED TO, READ BACK BY AND VERIFIED WITH: E JACKSON PHARMD 09/13/20 0347 JDW    Staphylococcus aureus (BCID) DETECTED (A) NOT DETECTED Final    Comment: CRITICAL RESULT CALLED TO, READ BACK BY AND VERIFIED WITH: E JACKSON PHARMD 09/13/20 0347 JDW    Staphylococcus epidermidis NOT DETECTED NOT DETECTED Final   Staphylococcus lugdunensis NOT DETECTED NOT DETECTED Final   Streptococcus species NOT DETECTED NOT DETECTED Final   Streptococcus agalactiae NOT DETECTED NOT DETECTED Final   Streptococcus pneumoniae NOT DETECTED NOT DETECTED Final   Streptococcus pyogenes NOT DETECTED NOT DETECTED Final   A.calcoaceticus-baumannii NOT DETECTED NOT DETECTED Final   Bacteroides fragilis NOT DETECTED NOT DETECTED Final   Enterobacterales NOT DETECTED NOT DETECTED Final   Enterobacter cloacae complex NOT DETECTED NOT DETECTED Final   Escherichia coli NOT DETECTED NOT DETECTED Final   Klebsiella aerogenes  NOT DETECTED NOT DETECTED Final   Klebsiella oxytoca NOT DETECTED NOT DETECTED Final   Klebsiella pneumoniae NOT DETECTED NOT DETECTED Final   Proteus species NOT DETECTED NOT DETECTED Final   Salmonella species NOT DETECTED NOT DETECTED Final  Serratia marcescens NOT DETECTED NOT DETECTED Final   Haemophilus influenzae NOT DETECTED NOT DETECTED Final   Neisseria meningitidis NOT DETECTED NOT DETECTED Final   Pseudomonas aeruginosa NOT DETECTED NOT DETECTED Final   Stenotrophomonas maltophilia NOT DETECTED NOT DETECTED Final   Candida albicans NOT DETECTED NOT DETECTED Final   Candida auris NOT DETECTED NOT DETECTED Final   Candida glabrata NOT DETECTED NOT DETECTED Final   Candida krusei NOT DETECTED NOT DETECTED Final   Candida parapsilosis NOT DETECTED NOT DETECTED Final   Candida tropicalis NOT DETECTED NOT DETECTED Final   Cryptococcus neoformans/gattii NOT DETECTED NOT DETECTED Final   Meth resistant mecA/C and MREJ NOT DETECTED NOT DETECTED Final    Comment: Performed at [facility], [place] [address] [place], [place] [address]  Culture, blood (routine x 2)     Status: Abnormal   Collection Time: 09/13/20 11:20 AM   Specimen: BLOOD  Result Value Ref Range Status   Specimen Description   Final    BLOOD RIGHT ANTECUBITAL Performed at [facility] [address] [place], [place] [address]    Special Requests   Final    BOTTLES DRAWN AEROBIC AND ANAEROBIC Blood Culture adequate volume Performed at [place] [address] [place], [place] [address]    Culture  Setup Time   Final    IN BOTH AEROBIC AND ANAEROBIC BOTTLES GRAM POSITIVE COCCI CRITICAL VALUE NOTED.  VALUE IS CONSISTENT WITH PREVIOUSLY REPORTED AND CALLED VALUE.    Culture (A)  Final    STAPHYLOCOCCUS AUREUS SUSCEPTIBILITIES PERFORMED ON PREVIOUS CULTURE WITHIN THE LAST 5 DAYS. Performed at [facility], [place] [address], [place], [place] [address]    Report  Status 09/16/2020 FINAL  Final  Resp Panel by RT-PCR (Flu A&B, Covid) Nasopharyngeal Swab     Status: None   Collection Time: 09/13/20  2:01 PM   Specimen: Nasopharyngeal Swab; Nasopharyngeal(NP) swabs in vial transport medium  Result Value Ref Range Status   SARS Coronavirus 2 by RT PCR NEGATIVE NEGATIVE Final    Comment: (NOTE) SARS-CoV-2 target nucleic acids are NOT DETECTED.  The SARS-CoV-2 RNA is generally detectable in upper respiratory specimens during the acute phase of infection. The lowest concentration of SARS-CoV-2 viral copies this assay can detect is 138 copies/mL. A negative result does not preclude SARS-Cov-2 infection and should not be used as the sole basis for treatment or other patient management decisions. A negative result may occur with  improper specimen collection/handling, submission of specimen other than nasopharyngeal swab, presence of viral mutation(s) within the areas targeted by this assay, and inadequate number of viral copies(<138 copies/mL). A negative result must be combined with clinical observations, patient history, and epidemiological information. The expected result is Negative.  Fact Sheet for Patients:  EntrepreneurPulse.com.au  Fact Sheet for Healthcare Providers:  IncredibleEmployment.be  This test is no t yet approved or cleared by the [place] FDA and  has been authorized for detection and/or diagnosis of SARS-CoV-2 by FDA under an Emergency Use Authorization (EUA). This EUA will remain  in effect (meaning this test can be used) for the duration of the COVID-19 declaration under Section 564(b)(1) of the Act, 21 U.S.C.section 360bbb-3(b)(1), unless the authorization is terminated  or revoked sooner.       Influenza A by PCR NEGATIVE NEGATIVE Final   Influenza B by PCR NEGATIVE NEGATIVE Final    Comment: (NOTE) The Xpert Xpress SARS-CoV-2/FLU/RSV plus assay is intended as an  aid in the  diagnosis of influenza from Nasopharyngeal swab specimens and should not be used as a sole basis for treatment. Nasal washings and aspirates are unacceptable for Xpert Xpress SARS-CoV-2/FLU/RSV testing.  Fact Sheet for Patients: EntrepreneurPulse.com.au  Fact Sheet for Healthcare Providers: IncredibleEmployment.be  This test is not yet approved or cleared by the [place] FDA and has been authorized for detection and/or diagnosis of SARS-CoV-2 by FDA under an Emergency Use Authorization (EUA). This EUA will remain in effect (meaning this test can be used) for the duration of the COVID-19 declaration under Section 564(b)(1) of the Act, 21 U.S.C. section 360bbb-3(b)(1), unless the authorization is terminated or revoked.  Performed at [facility], [place] [address], [place], [place] [address]   Culture, blood (routine x 2)     Status: None   Collection Time: 09/14/20  9:23 AM   Specimen: BLOOD RIGHT HAND  Result Value Ref Range Status   Specimen Description   Final    BLOOD RIGHT HAND Performed at [facility] [address], [place], [place] [address]    Special Requests   Final    BOTTLES DRAWN AEROBIC AND ANAEROBIC Blood Culture adequate volume Performed at [place] [address], [place], [place] [address]    Culture   Final    NO GROWTH 5 DAYS Performed at [facility], [place] [address], [place], [place] [address]    Report Status 09/19/2020 FINAL  Final  Culture, blood (routine x 2)     Status: None   Collection Time: 09/14/20  9:24 AM   Specimen: BLOOD LEFT HAND  Result Value Ref Range Status   Specimen Description   Final    BLOOD LEFT HAND Performed at [facility] [address], [place], [place] [address]    Special Requests   Final    BOTTLES DRAWN AEROBIC ONLY Blood Culture adequate volume Performed at [facility] [address], [place], [place] [address]    Culture   Final    NO GROWTH 5 DAYS Performed at [facility], [place] [address], [place], [place] [address]    Report Status 09/19/2020 FINAL  Final  Aerobic/Anaerobic Culture (surgical/deep wound)     Status: None   Collection Time: 09/14/20 12:37 PM   Specimen: Abscess  Result Value Ref Range Status   Specimen Description   Final    ABSCESS  LEFT RP Performed at [facility] [address] [place], [place] [address]    Special Requests   Final    Normal Performed at [facility], [place] [address] [place], [place] [address]    Gram Stain   Final    MODERATE WBC PRESENT, PREDOMINANTLY PMN ABUNDANT GRAM POSITIVE COCCI ABUNDANT GRAM NEGATIVE RODS    Culture   Final    ABUNDANT STAPHYLOCOCCUS AUREUS MODERATE BACTEROIDES ORALIS FEW PREVOTELLA BIVIA BETA LACTAMASE POSITIVE Performed at [facility], [place] [address], [place],  [address]    Report Status 09/19/2020 FINAL  Final   Organism ID, Bacteria STAPHYLOCOCCUS AUREUS  Final      Susceptibility   Staphylococcus aureus - MIC*    CIPROFLOXACIN <=0.5 SENSITIVE Sensitive     ERYTHROMYCIN <=0.25 SENSITIVE Sensitive     GENTAMICIN <=0.5 SENSITIVE Sensitive     OXACILLIN <=0.25 SENSITIVE Sensitive     TETRACYCLINE <=1 SENSITIVE Sensitive     VANCOMYCIN <=0.5 SENSITIVE Sensitive  TRIMETH/SULFA <=10 SENSITIVE Sensitive     CLINDAMYCIN <=0.25 SENSITIVE Sensitive     RIFAMPIN <=0.5 SENSITIVE Sensitive     Inducible Clindamycin NEGATIVE Sensitive     * ABUNDANT STAPHYLOCOCCUS AUREUS  MRSA PCR Screening     Status: None   Collection Time: 09/15/20  3:22 AM   Specimen: Nasal Mucosa; Nasopharyngeal  Result Value Ref Range Status   MRSA by PCR NEGATIVE NEGATIVE Final    Comment:        The GeneXpert MRSA Assay (FDA approved for NASAL specimens only), is one component of a comprehensive MRSA colonization surveillance program.  It is not intended to diagnose MRSA infection nor to guide or monitor treatment for MRSA infections. Performed at [facility], [place] [address] [place], [place] [address]          Radiology Studies: No results found.      Scheduled Meds: . sodium chloride   Intravenous Once  . carvedilol  6.25 mg Oral BID WC  . Chlorhexidine Gluconate Cloth  6 each Topical Daily  . docusate sodium  100 mg Oral BID  . folic acid  1 mg Oral Daily  . lactulose  10 g Oral BID  . mouth rinse  15 mL Mouth Rinse BID  . metroNIDAZOLE  500 mg Oral Q8H  . multivitamin with minerals  1 tablet Oral Daily  . pantoprazole  40 mg Oral BID  . sertraline  25 mg Oral Daily  . sodium chloride flush  5 mL Intracatheter Q8H  . thiamine  100 mg Oral Daily   Continuous Infusions: .  ceFAZolin (ANCEF) IV Stopped (09/21/20 0541)     LOS: 8 days   Time spent= 35 mins   Dede Query, RN NP-Student   If 7PM-7AM, please contact night-coverage  09/21/2020, 12:56 PM

## 2020-09-21 NOTE — Progress Notes (Signed)
[facility]   Reason for visit: Follow up on disseminated MSSA infection  Interval History: had operative debridement of left thigh excisional debridement yesterday by Dr. Sena Slate.  Plan for left groin I and D tomorrow by Dr. Luisa Hart.  He feels a bit better overall but still with pain.  WBC stable at 11.9.  Remains afebrile.  Day 9 cefazolin Day 4 metronidazole   Physical Exam: Constitutional:  Vitals:   09/21/20 1100 09/21/20 1200  BP: 116/81 123/74  Pulse: (!) 104 (!) 103  Resp: 17 14  Temp:  97.6 F (36.4 C)  SpO2: 97% 98%  he is in no acute distress Respiratory: normal respiratory effort Cardiovascular: RRR Skin: no rashes  Review of Systems: Constitutional: negative for chills Integument/breast: negative for rash  Lab Results  Component Value Date   WBC 11.9 (H) 09/21/2020   HGB 7.8 (L) 09/21/2020   HCT 23.5 (L) 09/21/2020   MCV 102.0 (H) 09/21/2020   PLT 104 (L) 09/21/2020    Lab Results  Component Value Date   CREATININE 0.34 (L) 09/21/2020   BUN 10 09/21/2020   NA 126 (L) 09/21/2020   K 4.8 09/21/2020   CL 95 (L) 09/21/2020   CO2 23 09/21/2020    Lab Results  Component Value Date   ALT 11 09/21/2020   AST 30 09/21/2020   ALKPHOS 214 (H) 09/21/2020     Microbiology: Recent Results (from the past 240 hour(s))  Culture, blood (routine x 2)     Status: Abnormal   Collection Time: 09/13/20 10:45 AM   Specimen: BLOOD  Result Value Ref Range Status   Specimen Description   Final    BLOOD RIGHT ANTECUBITAL Performed at [facility], [address] [place], [place] [address]    Special Requests   Final    BOTTLES DRAWN AEROBIC AND ANAEROBIC Blood Culture adequate volume Performed at [facility], [address] [place], [place] [address]    Culture  Setup Time   Final    IN BOTH AEROBIC AND ANAEROBIC BOTTLES GRAM POSITIVE COCCI CRITICAL RESULT CALLED TO, READ BACK BY AND VERIFIED  WITHErling Cruz [facility] 09/13/20 0347 JDW Performed at [facility], [address] [place], [place] [address]    Culture STAPHYLOCOCCUS AUREUS (A)  Final   Report Status 09/16/2020 FINAL  Final   Organism ID, Bacteria STAPHYLOCOCCUS AUREUS  Final      Susceptibility   Staphylococcus aureus - MIC*    CIPROFLOXACIN <=0.5 SENSITIVE Sensitive     ERYTHROMYCIN <=0.25 SENSITIVE Sensitive     GENTAMICIN <=0.5 SENSITIVE Sensitive     OXACILLIN <=0.25 SENSITIVE Sensitive     TETRACYCLINE <=1 SENSITIVE Sensitive     VANCOMYCIN <=0.5 SENSITIVE Sensitive     TRIMETH/SULFA <=10 SENSITIVE Sensitive     CLINDAMYCIN <=0.25 SENSITIVE Sensitive     RIFAMPIN <=0.5 SENSITIVE Sensitive     Inducible Clindamycin NEGATIVE Sensitive     * STAPHYLOCOCCUS AUREUS  Blood Culture ID Panel (Reflexed)     Status: Abnormal   Collection Time: 09/13/20 10:45 AM  Result Value Ref Range Status   Enterococcus faecalis NOT DETECTED NOT DETECTED Final   Enterococcus Faecium NOT DETECTED NOT DETECTED Final   Listeria monocytogenes NOT DETECTED NOT DETECTED Final   Staphylococcus species DETECTED (A) NOT DETECTED Final    Comment: CRITICAL RESULT CALLED TO, READ BACK BY AND VERIFIED WITH: E JACKSON PHARMD 09/13/20 0347 JDW  Staphylococcus aureus (BCID) DETECTED (A) NOT DETECTED Final    Comment: CRITICAL RESULT CALLED TO, READ BACK BY AND VERIFIED WITH: E JACKSON PHARMD 09/13/20 0347 JDW    Staphylococcus epidermidis NOT DETECTED NOT DETECTED Final   Staphylococcus lugdunensis NOT DETECTED NOT DETECTED Final   Streptococcus species NOT DETECTED NOT DETECTED Final   Streptococcus agalactiae NOT DETECTED NOT DETECTED Final   Streptococcus pneumoniae NOT DETECTED NOT DETECTED Final   Streptococcus pyogenes NOT DETECTED NOT DETECTED Final   A.calcoaceticus-baumannii NOT DETECTED NOT DETECTED Final   Bacteroides fragilis NOT DETECTED NOT DETECTED Final   Enterobacterales NOT DETECTED NOT DETECTED Final    Enterobacter cloacae complex NOT DETECTED NOT DETECTED Final   Escherichia coli NOT DETECTED NOT DETECTED Final   Klebsiella aerogenes NOT DETECTED NOT DETECTED Final   Klebsiella oxytoca NOT DETECTED NOT DETECTED Final   Klebsiella pneumoniae NOT DETECTED NOT DETECTED Final   Proteus species NOT DETECTED NOT DETECTED Final   Salmonella species NOT DETECTED NOT DETECTED Final   Serratia marcescens NOT DETECTED NOT DETECTED Final   Haemophilus influenzae NOT DETECTED NOT DETECTED Final   Neisseria meningitidis NOT DETECTED NOT DETECTED Final   Pseudomonas aeruginosa NOT DETECTED NOT DETECTED Final   Stenotrophomonas maltophilia NOT DETECTED NOT DETECTED Final   Candida albicans NOT DETECTED NOT DETECTED Final   Candida auris NOT DETECTED NOT DETECTED Final   Candida glabrata NOT DETECTED NOT DETECTED Final   Candida krusei NOT DETECTED NOT DETECTED Final   Candida parapsilosis NOT DETECTED NOT DETECTED Final   Candida tropicalis NOT DETECTED NOT DETECTED Final   Cryptococcus neoformans/gattii NOT DETECTED NOT DETECTED Final   Meth resistant mecA/C and MREJ NOT DETECTED NOT DETECTED Final    Comment: Performed at [facility], [address], [place], [place] [address]  Culture, blood (routine x 2)     Status: Abnormal   Collection Time: 09/13/20 11:20 AM   Specimen: BLOOD  Result Value Ref Range Status   Specimen Description   Final    BLOOD RIGHT ANTECUBITAL Performed at [facility], [address], [place], [place] [address]    Special Requests   Final    BOTTLES DRAWN AEROBIC AND ANAEROBIC Blood Culture adequate volume Performed at [facility], [address] [place], [place] [address]    Culture  Setup Time   Final    IN BOTH AEROBIC AND ANAEROBIC BOTTLES GRAM POSITIVE COCCI CRITICAL VALUE NOTED.  VALUE IS CONSISTENT WITH PREVIOUSLY REPORTED AND CALLED VALUE.    Culture (A)  Final    STAPHYLOCOCCUS AUREUS SUSCEPTIBILITIES  PERFORMED ON PREVIOUS CULTURE WITHIN THE LAST 5 DAYS. Performed at [facility], [address] [place], [place] [address]    Report Status 09/16/2020 FINAL  Final  Resp Panel by RT-PCR (Flu A&B, Covid) Nasopharyngeal Swab     Status: None   Collection Time: 09/13/20  2:01 PM   Specimen: Nasopharyngeal Swab; Nasopharyngeal(NP) swabs in vial transport medium  Result Value Ref Range Status   SARS Coronavirus 2 by RT PCR NEGATIVE NEGATIVE Final    Comment: (NOTE) SARS-CoV-2 target nucleic acids are NOT DETECTED.  The SARS-CoV-2 RNA is generally detectable in upper respiratory specimens during the acute phase of infection. The lowest concentration of SARS-CoV-2 viral copies this assay can detect is 138 copies/mL. A negative result does not preclude SARS-Cov-2 infection and should not be used as the sole basis for treatment or other patient management decisions. A negative result  may occur with  improper specimen collection/handling, submission of specimen other than nasopharyngeal swab, presence of viral mutation(s) within the areas targeted by this assay, and inadequate number of viral copies(<138 copies/mL). A negative result must be combined with clinical observations, patient history, and epidemiological information. The expected result is Negative.  Fact Sheet for Patients:  BloggerCourse.comhttps://www.fda.gov/media/152166/download  Fact Sheet for Healthcare Providers:  SeriousBroker.ithttps://www.fda.gov/media/152162/download  This test is no t yet approved or cleared by the [place] FDA and  has been authorized for detection and/or diagnosis of SARS-CoV-2 by FDA under an Emergency Use Authorization (EUA). This EUA will remain  in effect (meaning this test can be used) for the duration of the COVID-19 declaration under Section 564(b)(1) of the Act, 21 U.S.C.section 360bbb-3(b)(1), unless the authorization is terminated  or revoked sooner.       Influenza A by PCR NEGATIVE NEGATIVE Final    Influenza B by PCR NEGATIVE NEGATIVE Final    Comment: (NOTE) The Xpert Xpress SARS-CoV-2/FLU/RSV plus assay is intended as an aid in the diagnosis of influenza from Nasopharyngeal swab specimens and should not be used as a sole basis for treatment. Nasal washings and aspirates are unacceptable for Xpert Xpress SARS-CoV-2/FLU/RSV testing.  Fact Sheet for Patients: BloggerCourse.comhttps://www.fda.gov/media/152166/download  Fact Sheet for Healthcare Providers: SeriousBroker.ithttps://www.fda.gov/media/152162/download  This test is not yet approved or cleared by the [place] FDA and has been authorized for detection and/or diagnosis of SARS-CoV-2 by FDA under an Emergency Use Authorization (EUA). This EUA will remain in effect (meaning this test can be used) for the duration of the COVID-19 declaration under Section 564(b)(1) of the Act, 21 U.S.C. section 360bbb-3(b)(1), unless the authorization is terminated or revoked.  Performed at [facility], [address] [place], [place] [address]   Culture, blood (routine x 2)     Status: None   Collection Time: 09/14/20  9:23 AM   Specimen: BLOOD RIGHT HAND  Result Value Ref Range Status   Specimen Description   Final    BLOOD RIGHT HAND Performed at [facility], [address] [place], [place] [address]    Special Requests   Final    BOTTLES DRAWN AEROBIC AND ANAEROBIC Blood Culture adequate volume Performed at [facility], [address] [place], [place] [address]    Culture   Final    NO GROWTH 5 DAYS Performed at [facility], [address] [place], [place] [address]    Report Status 09/19/2020 FINAL  Final  Culture, blood (routine x 2)     Status: None   Collection Time: 09/14/20  9:24 AM   Specimen: BLOOD LEFT HAND  Result Value Ref Range Status   Specimen Description   Final    BLOOD LEFT HAND Performed at [facility], [address] [place], [place]  [address]    Special Requests   Final    BOTTLES DRAWN AEROBIC ONLY Blood Culture adequate volume Performed at [facility], [address] [place], [place] [address]    Culture   Final    NO GROWTH 5 DAYS Performed at [facility], [address] [place], [place] [address]    Report Status 09/19/2020 FINAL  Final  Aerobic/Anaerobic Culture (surgical/deep wound)     Status: None   Collection Time: 09/14/20 12:37 PM   Specimen: Abscess  Result Value Ref Range Status   Specimen Description   Final    ABSCESS  LEFT RP  Performed at [facility], [address], [place], [place] [address]    Special Requests   Final    Normal Performed at [facility] [address], [place], [place] [address]    Gram Stain   Final    MODERATE WBC PRESENT, PREDOMINANTLY PMN ABUNDANT GRAM POSITIVE COCCI ABUNDANT GRAM NEGATIVE RODS    Culture   Final    ABUNDANT STAPHYLOCOCCUS AUREUS MODERATE BACTEROIDES ORALIS FEW PREVOTELLA BIVIA BETA LACTAMASE POSITIVE Performed at [facility], [address], [place], [place] [address]    Report Status 09/19/2020 FINAL  Final   Organism ID, Bacteria STAPHYLOCOCCUS AUREUS  Final      Susceptibility   Staphylococcus aureus - MIC*    CIPROFLOXACIN <=0.5 SENSITIVE Sensitive     ERYTHROMYCIN <=0.25 SENSITIVE Sensitive     GENTAMICIN <=0.5 SENSITIVE Sensitive     OXACILLIN <=0.25 SENSITIVE Sensitive     TETRACYCLINE <=1 SENSITIVE Sensitive     VANCOMYCIN <=0.5 SENSITIVE Sensitive     TRIMETH/SULFA <=10 SENSITIVE Sensitive     CLINDAMYCIN <=0.25 SENSITIVE Sensitive     RIFAMPIN <=0.5 SENSITIVE Sensitive     Inducible Clindamycin NEGATIVE Sensitive     * ABUNDANT STAPHYLOCOCCUS AUREUS  MRSA PCR Screening     Status: None   Collection Time: 09/15/20  3:22 AM   Specimen: Nasal Mucosa; Nasopharyngeal  Result Value Ref Range Status   MRSA by PCR NEGATIVE NEGATIVE Final    Comment:        The  GeneXpert MRSA Assay (FDA approved for NASAL specimens only), is one component of a comprehensive MRSA colonization surveillance program. It is not intended to diagnose MRSA infection nor to guide or monitor treatment for MRSA infections. Performed at [facility], [address] [place], [place] [address]     Impression/Plan:  1.  MSSA bacteremia - TEE without obvious vegetation, repeat blood cultures have remained negative.  He will continue on cefazolin.   2. Hip and thigh abscess - s/p debridement by orthopedics and CCS to further debride groin area tomorrow.  Extensive infection and will continue with antibiotics as above.   3. Scrotal edema - seen by urology and felt likely secondary to adjacent infection and not actively infected.  Foley placed.

## 2020-09-21 NOTE — H&P (View-Only) (Signed)
[facility] Progress Note  1 Day Post-Op  Subjective: CC:  Asked to re-evaluate patient this AM by orthopedic surgery, due to concern for possible residual abscess in the left groin. Pt reports less overall pain/pressure in his perineum, still having a lot of edema and weeping from his skin. Tolerating PO. Last Bm was Sunday (3 days) and non-bloody). His girlfriend is at the bedside.   Afebrile, HR 100-114bpm, got 1 u pRBC this AM for hgb 6.4  Objective: Vital signs in last 24 hours: Temp:  [98 F (36.7 C)-99.5 F (37.5 C)] 98.4 F (36.9 C) (03/02 0933) Pulse Rate:  [98-114] 103 (03/02 0933) Resp:  [14-25] 21 (03/02 0933) BP: (107-131)/(57-83) 121/83 (03/02 0933) SpO2:  [92 %-100 %] 99 % (03/02 0933) Weight:  [93.9 kg] 93.9 kg (03/02 0500) Last BM Date: 09/18/20  Intake/Output from previous day: 03/01 0701 - 03/02 0700 In: 1140 [I.V.:1140] Out: 1412 [Urine:290; Drains:972; Blood:150] Intake/Output this shift: Total I/O In: 322 [Blood:322] Out: -   PE: Gen:  Alert, NAD, cooperative  Card:  Regular rate and rhythm, pedal pulses 2+ BL Pulm:  Normal effort, clear to auscultation bilaterally Abd: Soft, protubetant, nontender  LLE: there is an incision over the left lateral hip, closed with staples, no cellulitis, serous oozing from staple line; NPWT to L medial thigh in place, holding suction. Sanguinous drainage in cannister. There are 3 percutaneous drains in the upper left leg draining cloudy SS drainage.  GU: there is scrotal edema, foley in place. Left side of scrotum is edematous. There is edema of the left proximal thigh and perineum, there is a wound ~3.5x2 cm in size in the left medial groin with about 8cm blanching erythema inferiorly and posteriorly - draining a lot of SS and also some cloudy/purulent fluid.       Skin: other than skin changes of L hip/thigh/perineum above - warm and dry, no rashes  Psych: A&Ox3   Lab Results:  Recent Labs     09/20/20 0244 09/21/20 0234  WBC 10.7* 11.9*  HGB 7.7* 6.4*  HCT 24.0* 20.3*  PLT 99* 104*   BMET Recent Labs    09/20/20 0244 09/21/20 0234  NA 131* 126*  K 4.3 4.8  CL 95* 95*  CO2 23 23  GLUCOSE 96 173*  BUN 13 10  CREATININE 0.43* 0.34*  CALCIUM 7.4* 7.1*   PT/INR Recent Labs    09/19/20 0305  LABPROT 17.3*  INR 1.5*   CMP     Component Value Date/Time   NA 126 (L) 09/21/2020 0234   K 4.8 09/21/2020 0234   CL 95 (L) 09/21/2020 0234   CO2 23 09/21/2020 0234   GLUCOSE 173 (H) 09/21/2020 0234   BUN 10 09/21/2020 0234   CREATININE 0.34 (L) 09/21/2020 0234   CALCIUM 7.1 (L) 09/21/2020 0234   PROT 4.8 (L) 09/21/2020 0234   ALBUMIN 1.7 (L) 09/21/2020 0234   AST 30 09/21/2020 0234   ALT 11 09/21/2020 0234   ALKPHOS 214 (H) 09/21/2020 0234   BILITOT 1.5 (H) 09/21/2020 0234   GFRNONAA >60 09/21/2020 0234   GFRAA >60 02/24/2019 1520   Lipase     Component Value Date/Time   LIPASE 99 (H) 08/23/2020 1500       Studies/Results: No results found.  Anti-infectives: Anti-infectives (From admission, onward)   Start     Dose/Rate Route Frequency Ordered Stop   09/18/20 1400  metroNIDAZOLE (FLAGYL) tablet 500 mg          500 mg Oral Every 8 hours 09/18/20 0927     09/15/20 1715  tobramycin (NEBCIN) powder  Status:  Discontinued          As needed 09/15/20 1715 09/15/20 2043   09/15/20 1715  vancomycin (VANCOCIN) powder  Status:  Discontinued          As needed 09/15/20 1716 09/15/20 2043   09/14/20 0600  ceFAZolin (ANCEF) IVPB 2g/100 mL premix        2 g 200 mL/hr over 30 Minutes Intravenous Every 8 hours 09/14/20 0359     09/13/20 2200  vancomycin (VANCOREADY) IVPB 1500 mg/300 mL  Status:  Discontinued        1,500 mg 150 mL/hr over 120 Minutes Intravenous Every 12 hours 09/13/20 1636 09/14/20 0359   09/13/20 1500  ceFEPIme (MAXIPIME) 2 g in sodium chloride 0.9 % 100 mL IVPB  Status:  Discontinued        2 g 200 mL/hr over 30 Minutes Intravenous Every 8  hours 09/13/20 1437 09/14/20 0359   09/13/20 1130  vancomycin (VANCOREADY) IVPB 1750 mg/350 mL        1,750 mg 175 mL/hr over 120 Minutes Intravenous  Once 09/13/20 1107 09/13/20 1414   09/13/20 1045  ceFAZolin (ANCEF) IVPB 1 g/50 mL premix        1 g 100 mL/hr over 30 Minutes Intravenous  Once 09/13/20 1031 09/13/20 1307     Assessment/Plan Symptomatic anemia  Sinus tachycardia, chronic  Thrombocytopenia - Plts 104 today, monitor PMH alcoholic hepatitis Hepatic cirrhosis with ascites GAD GERD  PMH AVN  Septic shock due to below on admission - now resolved s/p below procedures and IV abx MSSA bacteremia 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 Sena Slate 09/20/20    Left thigh irrigation and debridement with wound vac placement, Dr. Dion Saucier  - afebrile, WBC 11.9, Hgb 6.4 today (got 1 u pRBC and repeat H&H pending)  - would likely benefit from incision and drainage of left proximal and medial thigh given quality of current drainage and blanching erythema. Suspect a lot of the induration is just from inflammatory changes and edema, not sure how much purulence is underneath. Will make him NPO after MN and plan to perform I&D in the OR tomorrow. Check AM labs.    LOS: 8 days    Hosie Spangle, [facility] Please see Amion for pager number during day hours 7:00am-4:30pm

## 2020-09-21 NOTE — Progress Notes (Signed)
Critical lab value: Hgb 6.5 Time notified: 0335 Provider notified: Opyd notified via Amion @ [phone] Will continue to monitor, awaiting orders

## 2020-09-21 NOTE — Progress Notes (Signed)
[facility] Progress Note  1 Day Post-Op  Subjective: CC:  Asked to re-evaluate patient this AM by orthopedic surgery, due to concern for possible residual abscess in the left groin. Pt reports less overall pain/pressure in his perineum, still having a lot of edema and weeping from his skin. Tolerating PO. Last Bm was Sunday (3 days) and non-bloody). His girlfriend is at the bedside.   Afebrile, HR 100-114bpm, got 1 u pRBC this AM for hgb 6.4  Objective: Vital signs in last 24 hours: Temp:  [98 F (36.7 C)-99.5 F (37.5 C)] 98.4 F (36.9 C) (03/02 0933) Pulse Rate:  [98-114] 103 (03/02 0933) Resp:  [14-25] 21 (03/02 0933) BP: (107-131)/(57-83) 121/83 (03/02 0933) SpO2:  [92 %-100 %] 99 % (03/02 0933) Weight:  [93.9 kg] 93.9 kg (03/02 0500) Last BM Date: 09/18/20  Intake/Output from previous day: 03/01 0701 - 03/02 0700 In: 1140 [I.V.:1140] Out: 1412 [Urine:290; Drains:972; Blood:150] Intake/Output this shift: Total I/O In: 322 [Blood:322] Out: -   PE: Gen:  Alert, NAD, cooperative  Card:  Regular rate and rhythm, pedal pulses 2+ BL Pulm:  Normal effort, clear to auscultation bilaterally Abd: Soft, protubetant, nontender  LLE: there is an incision over the left lateral hip, closed with staples, no cellulitis, serous oozing from staple line; NPWT to L medial thigh in place, holding suction. Sanguinous drainage in cannister. There are 3 percutaneous drains in the upper left leg draining cloudy SS drainage.  GU: there is scrotal edema, foley in place. Left side of scrotum is edematous. There is edema of the left proximal thigh and perineum, there is a wound ~3.5x2 cm in size in the left medial groin with about 8cm blanching erythema inferiorly and posteriorly - draining a lot of SS and also some cloudy/purulent fluid.       Skin: other than skin changes of L hip/thigh/perineum above - warm and dry, no rashes  Psych: A&Ox3   Lab Results:  Recent Labs     03 /01/22 0244 09/21/20 0234  WBC 10.7* 11.9*  HGB 7.7* 6.4*  HCT 24.0* 20.3*  PLT 99* 104*   BMET Recent Labs    09/20/20 0244 09/21/20 0234  NA 131* 126*  K 4.3 4.8  CL 95* 95*  CO2 23 23  GLUCOSE 96 173*  BUN 13 10  CREATININE 0.43* 0.34*  CALCIUM 7.4* 7.1*   PT/INR Recent Labs    09/19/20 0305  LABPROT 17.3*  INR 1.5*   CMP     Component Value Date/Time   NA 126 (L) 09/21/2020 0234   K 4.8 09/21/2020 0234   CL 95 (L) 09/21/2020 0234   CO2 23 09/21/2020 0234   GLUCOSE 173 (H) 09/21/2020 0234   BUN 10 09/21/2020 0234   CREATININE 0.34 (L) 09/21/2020 0234   CALCIUM 7.1 (L) 09/21/2020 0234   PROT 4.8 (L) 09/21/2020 0234   ALBUMIN 1.7 (L) 09/21/2020 0234   AST 30 09/21/2020 0234   ALT 11 09/21/2020 0234   ALKPHOS 214 (H) 09/21/2020 0234   BILITOT 1.5 (H) 09/21/2020 0234   GFRNONAA >60 09/21/2020 0234   GFRAA >60 02/24/2019 1520   Lipase     Component Value Date/Time   LIPASE 99 (H) 08/23/2020 1500       Studies/Results: No results found.  Anti-infectives: Anti-infectives (From admission, onward)   Start     Dose/Rate Route Frequency Ordered Stop   09/18/20 1400  metroNIDAZOLE (FLAGYL) tablet 500 mg  500 mg Oral Every 8 hours 09/18/20 0927     09/15/20 1715  tobramycin (NEBCIN) powder  Status:  Discontinued          As needed 09/15/20 1715 09/15/20 2043   09/15/20 1715  vancomycin (VANCOCIN) powder  Status:  Discontinued          As needed 09/15/20 1716 09/15/20 2043   09/14/20 0600  ceFAZolin (ANCEF) IVPB 2g/100 mL premix        2 g 200 mL/hr over 30 Minutes Intravenous Every 8 hours 09/14/20 0359     09/13/20 2200  vancomycin (VANCOREADY) IVPB 1500 mg/300 mL  Status:  Discontinued        1,500 mg 150 mL/hr over 120 Minutes Intravenous Every 12 hours 09/13/20 1636 09/14/20 0359   09/13/20 1500  ceFEPIme (MAXIPIME) 2 g in sodium chloride 0.9 % 100 mL IVPB  Status:  Discontinued        2 g 200 mL/hr over 30 Minutes Intravenous Every 8  hours 09/13/20 1437 09/14/20 0359   09/13/20 1130  vancomycin (VANCOREADY) IVPB 1750 mg/350 mL        1,750 mg 175 mL/hr over 120 Minutes Intravenous  Once 09/13/20 1107 09/13/20 1414   09/13/20 1045  ceFAZolin (ANCEF) IVPB 1 g/50 mL premix        1 g 100 mL/hr over 30 Minutes Intravenous  Once 09/13/20 1031 09/13/20 1307     Assessment/Plan Symptomatic anemia  Sinus tachycardia, chronic  Thrombocytopenia - Plts 104 today, monitor PMH alcoholic hepatitis Hepatic cirrhosis with ascites GAD GERD  PMH AVN  Septic shock due to below on admission - now resolved s/p below procedures and IV abx MSSA bacteremia 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 Sena Slate 09/20/20    Left thigh irrigation and debridement with wound vac placement, Dr. Dion Saucier  - afebrile, WBC 11.9, Hgb 6.4 today (got 1 u pRBC and repeat H&H pending)  - would likely benefit from incision and drainage of left proximal and medial thigh given quality of current drainage and blanching erythema. Suspect a lot of the induration is just from inflammatory changes and edema, not sure how much purulence is underneath. Will make him NPO after MN and plan to perform I&D in the OR tomorrow. Check AM labs.    LOS: 8 days    Hosie Spangle, [facility] Please see Amion for pager number during day hours 7:00am-4:30pm

## 2020-09-21 NOTE — PMR Pre-admission (Signed)
PMR Admission Coordinator Pre-Admission Assessment  Patient: [name] is an 35 y.o., male MRN: [id number] DOB: [birth year] Height: 5' 10"  (177.8 cm) Weight: 85.4 kg  Insurance Information HMO:     PPO:      PCP:      IPA:      80/20:      OTHER:  PRIMARY: [facility]      Policy#: [id number]      Subscriber: Pt CM Name:      Phone#:      Fax#: [phone] Pre-Cert#: [id number] Received approval 10/08/20. Approved for 9 days with updates due 10/17/20      Employer:  Benefits:  Phone #: [phone]     Name:  Eff. Date: 02/21/20-11/19/20     Deduct: $3,800 ($0 met)      Out of Pocket Max: $6,950 ($0 met)      Life Max: NA CIR: 60% coverage, 40% co-insurance      SNF: 60% coverage, 40% co-insurance Outpatient: 60% coverage 40%    Co-Pay: 40% co-insurance Home Health: 60% coverage      Co-Pay: 40% co-insurance DME: 60% coverage     Co-Pay: 40% for rental only Providers: in-network SECONDARY: none       Policy#:      Phone#:   Development worker, community:       Phone#:   The Engineer, petroleum" for patients in Inpatient Rehabilitation Facilities with attached "Privacy Act [place] Records" was provided and verbally reviewed with: N/A  Emergency Contact Information Contact Information    Name Relation Home Work Mobile   [place] Mother [place]   Russell Other [phone]     [place] Daughter   [place], [place] Significant other   [phone]      Current Medical History  Patient Admitting Diagnosis: Debility, THA,  History of Present Illness: [name] is a 35 y.o. male with medical history significant of AVN of hip ( originally was scheduled for a THA this coming March 2022), HTN, GERD. Recent admission for COVID , acute renal failure and alcoholic hepatitis (5/00/37- 08/29/20). He was ok for a few days after discharge, but he began to feel very tired, weak, left thigh and leg pain and progressed  over the last 2 weeks. He reports that the left thigh had become increasingly swollen and tender to touch. He has had a blister rupture on the inner thigh in the groin area. These symptoms increased through 2 days PTA when he was no longer ago to walk without great pain. He presented to the [facility] ED 09/13/20 and was found to have a Hgb of 6.2, MSSA bacteremia complicated by iliopsoas/gluteal/thigh abscess. IR drain placement x3 on 09/14/2020. Pt. Also found to have  and left hip septic arthritis with osteomyelitis, and underwent total hip arthroplasty and antibiotic spacer plus excisional debridement of skin, subcutaneous tissue, muscle, bone, and deep abscess with orthopedic surgery 09/15/2020. Left thigh I & D and wound vac placement 3/1. CIR was consulted to assist in return to PLOF.      Patient's medical record from [facility] has been reviewed by the rehabilitation admission coordinator and physician.  Past Medical History  Past Medical History:  Diagnosis Date  . Alcohol abuse   . Asthma   . Depression   . Hypertension     Family History   family history is not on file.  Prior Rehab/Hospitalizations Has the patient had prior rehab or  hospitalizations prior to admission? Yes  Has the patient had major surgery during 100 days prior to admission? Yes   Current Medications  Current Facility-Administered Medications:  .  (feeding supplement) PROSource Plus liquid 30 mL, 30 mL, Oral, BID BM, Brown, Blaine K, PA-C, 30 mL at 10/10/20 0858 .  acetaminophen (TYLENOL) tablet 650 mg, 650 mg, Oral, Q6H PRN, 650 mg at 10/08/20 1548 **OR** acetaminophen (TYLENOL) suppository 650 mg, 650 mg, Rectal, Q6H PRN, Owens Shark, Blaine K, PA-C .  ceFAZolin (ANCEF) IVPB 2g/100 mL premix, 2 g, Intravenous, Q8H, Carlyle Basques, MD, Last Rate: 200 mL/hr at 10/10/20 0541, 2 g at 10/10/20 0541 .  Chlorhexidine Gluconate Cloth 2 % PADS 6 each, 6 each, Topical, Daily, Ventura Bruns, PA-C, 6  each at 10/10/20 0859 .  docusate sodium (COLACE) capsule 100 mg, 100 mg, Oral, BID, Merlene Pulling K, PA-C, 100 mg at 10/10/20 0859 .  feeding supplement (BOOST / RESOURCE BREEZE) liquid 1 Container, 1 Container, Oral, BID BM, Ventura Bruns, PA-C, 1 Container at 10/09/20 1322 .  folic acid (FOLVITE) tablet 1 mg, 1 mg, Oral, Daily, Merlene Pulling K, PA-C, 1 mg at 10/10/20 3009 .  furosemide (LASIX) tablet 40 mg, 40 mg, Oral, Daily, Mariel Aloe, MD, 40 mg at 10/10/20 0858 .  guaiFENesin (MUCINEX) 12 hr tablet 1,200 mg, 1,200 mg, Oral, BID, Raiford Noble Gerber, DO, 1,200 mg at 10/10/20 2330 .  hydrocortisone cream 1 %, , Topical, BID, Raiford Noble [place], [place], Given at 10/06/20 2200 .  HYDROmorphone (DILAUDID) injection 1 mg, 1 mg, Intravenous, BID PRN, Jill Alexanders, PA-C, 1 mg at 10/10/20 0548 .  ipratropium-albuterol (DUONEB) 0.5-2.5 (3) MG/3ML nebulizer solution 3 mL, 3 mL, Nebulization, Q4H PRN, Sheikh, Omair Latif, DO .  lactulose (CHRONULAC) 10 GM/15ML solution 10 g, 10 g, Oral, BID, Merlene Pulling K, PA-C, 10 g at 10/10/20 0857 .  MEDLINE mouth rinse, 15 mL, Mouth Rinse, BID, Merlene Pulling K, PA-C, 15 mL at 10/10/20 0859 .  metoCLOPramide (REGLAN) tablet 5-10 mg, 5-10 mg, Oral, Q8H PRN **OR** metoCLOPramide (REGLAN) injection 5-10 mg, 5-10 mg, Intravenous, Q8H PRN, Owens Shark, Blaine K, PA-C .  metoprolol tartrate (LOPRESSOR) tablet 50 mg, 50 mg, Oral, BID, Raiford Noble [place], DO, 50 mg at 10/10/20 0762 .  metroNIDAZOLE (FLAGYL) tablet 500 mg, 500 mg, Oral, Q8H, Carlyle Basques, MD, 500 mg at 10/10/20 0539 .  morphine (MS CONTIN) 12 hr tablet 15 mg, 15 mg, Oral, Q12H, Ventura Bruns, PA-C, 15 mg at 10/10/20 0858 .  morphine (MSIR) tablet 15 mg, 15 mg, Oral, Q6H PRN, Mariel Aloe, MD, 15 mg at 10/09/20 1316 .  multivitamin with minerals tablet 1 tablet, 1 tablet, Oral, Daily, Ventura Bruns, PA-C, 1 tablet at 10/10/20 2633 .  nutrition supplement (JUVEN) (JUVEN) powder packet 1 packet,  1 packet, Oral, BID BM, Ventura Bruns, PA-C, 1 packet at 10/09/20 1317 .  ondansetron (ZOFRAN) tablet 4 mg, 4 mg, Oral, Q6H PRN **OR** ondansetron (ZOFRAN) injection 4 mg, 4 mg, Intravenous, Q6H PRN, Merlene Pulling K, PA-C, 4 mg at 09/27/20 1056 .  pantoprazole (PROTONIX) EC tablet 40 mg, 40 mg, Oral, BID, Ventura Bruns, PA-C, 40 mg at 10/10/20 0858 .  polyethylene glycol (MIRALAX / GLYCOLAX) packet 17 g, 17 g, Oral, Daily PRN, Owens Shark, Blaine K, PA-C .  rifampin (RIFADIN) capsule 300 mg, 300 mg, Oral, Q12H, Carlyle Basques, MD, 300 mg at 10/10/20 0857 .  senna (SENOKOT) tablet 8.6 mg, 1 tablet, Oral,  BID, Merlene Pulling K, PA-C, 8.6 mg at 10/09/20 1022 .  sertraline (ZOLOFT) tablet 50 mg, 50 mg, Oral, Daily, Raiford Noble [place], DO, 50 mg at 10/10/20 4098 .  sodium chloride flush (NS) 0.9 % injection 10-40 mL, 10-40 mL, Intracatheter, PRN, Marchia Bond, MD .  sodium chloride flush (NS) 0.9 % injection 5 mL, 5 mL, Intracatheter, Q8H, Brown, Blaine K, PA-C, 5 mL at 10/10/20 0542 .  sodium chloride flush (NS) 0.9 % injection 5 mL, 5 mL, Intracatheter, Q8H, Brown, Blaine K, PA-C, 5 mL at 10/10/20 0542 .  spironolactone (ALDACTONE) tablet 100 mg, 100 mg, Oral, Daily, [place], Amy S, PA-C, 100 mg at 10/10/20 0858 .  thiamine tablet 100 mg, 100 mg, Oral, Daily, Ventura Bruns, PA-C, 100 mg at 10/10/20 1191  Patients Current Diet:  Diet Order            Diet 2 gram sodium Room service appropriate? Yes; Fluid consistency: Thin  Diet effective now                 Precautions / Restrictions Precautions Precautions: Fall,Posterior Hip Precaution Comments: Pt able to recall all THP; multiple lines and drains Restrictions Weight Bearing Restrictions: No LLE Weight Bearing: Weight bearing as tolerated Other Position/Activity Restrictions: 3 JP drains in Lt anterior thigh and wound vac   Has the patient had 2 or more falls or a fall with injury in the past year? No  Prior Activity  Level Community (5-7x/wk): Pt. was working and active in the community prior to [place] admission  Prior Functional Level Self Care: Did the patient need help bathing, dressing, using the toilet or eating? Needed some help  Indoor Mobility: Did the patient need assistance with walking from room to room (with or without device)? Needed some help  Stairs: Did the patient need assistance with internal or external stairs (with or without device)? Needed some help  Functional Cognition: Did the patient need help planning regular tasks such as shopping or remembering to take medications? Needed some help  Home Assistive Devices / [place] Devices/Equipment: Crutches Home Equipment: Cane - single point,Crutches,Walker - 2 wheels,Bedside commode  Prior Device Use: Indicate devices/aids used by the patient prior to current illness, exacerbation or injury? Walker  Current Functional Level Cognition  Overall Cognitive Status: Within Functional Limits for tasks assessed Orientation Level: Oriented X4 General Comments: AxO x 3    Extremity Assessment (includes Sensation/Coordination)  Upper Extremity Assessment: Overall WFL for tasks assessed  Lower Extremity Assessment: Defer to PT evaluation RLE Deficits / Details: likes to rest LE in ER and flexed, however can straighten R LE for full range , some weakness and atrophy noted ( LLE greater than Right) , generally 4/5 LLE Deficits / Details: very limited with pain. did full ROM with ankle and gentle knee bends and full extension with assistance. did not test stregth or against gravity due to pain.    ADLs  Overall ADL's : Needs assistance/impaired Eating/Feeding: Independent Grooming: Oral care,Wash/dry face,Wash/dry hands,Sitting,Set up Grooming Details (indicate cue type and reason): Pt sat EOB and performed all grooming with setup only. Upper Body Bathing: Set up,Sitting Lower Body Bathing: +2 for physical  assistance,Maximal assistance,+2 for safety/equipment,Sit to/from stand Upper Body Dressing : Set up,Bed level Lower Body Dressing: Total assistance,+2 for physical assistance,+2 for safety/equipment,Sit to/from stand Lower Body Dressing Details (indicate cue type and reason): Total assist to don socks. Left edema improved and changed to smaller sock. R foot still edematous  Toilet Transfer: Minimal assistance,+2 for physical assistance,+2 for safety/equipment Toilet Transfer Details (indicate cue type and reason): from elevated bed height and use of stedy worked on standing tolerance and global strengthening necessary to safely complete functional transfers. Patient able to power up to standing with min A x2, improved mobility from previous session Toileting- Clothing Manipulation and Hygiene: Total assistance,+2 for physical assistance,+2 for safety/equipment,Sit to/from stand Toileting - Clothing Manipulation Details (indicate cue type and reason): Total assist for pericare with use of steady and +2 assistance Functional mobility during ADLs: Moderate assistance,Maximal assistance,Rolling walker General ADL Comments: Pt. deferred EOB or OOB secondary to pain. Pt. states they did more sx on groin yesterday.    Mobility  Overal bed mobility: Needs Assistance Bed Mobility: Sit to Supine Supine to sit: Min assist,+2 for physical assistance,+2 for safety/equipment Sit to supine: Mod assist General bed mobility comments: Increased time with cues for sequence and use of R LE to self assist.  Physical assist to manage LEs    Transfers  Overall transfer level: Needs assistance Equipment used: Rolling walker (2 wheeled) Transfer via Lift Equipment: Stedy Transfers: Sit to/from Stand Sit to Stand: Mod assist,+2 physical assistance,+2 safety/equipment Stand pivot transfers: Mod assist,Max assist,+2 physical assistance,+2 safety/equipment General transfer comment: cues for LE management and use of UEs  to self assist; Increased physical assist to bring pt up from Recliner    Ambulation / Gait / Stairs / Wheelchair Mobility  Ambulation/Gait Ambulation/Gait assistance: Min assist,+2 safety/equipment Gait Distance (Feet): 5 Feet Assistive device: Rolling walker (2 wheeled) Gait Pattern/deviations: Step-to pattern,Decreased stride length,Shuffle,Antalgic General Gait Details: cues for sequence, posture and position from RW Gait velocity: decr    Posture / Balance Dynamic Sitting Balance Sitting balance - Comments: Using arms to position him self more posteriorly however able to lift arms off of bed and maintain sitting balance Balance Overall balance assessment: Needs assistance Sitting-balance support: Feet supported Sitting balance-Leahy Scale: Fair Sitting balance - Comments: Using arms to position him self more posteriorly however able to lift arms off of bed and maintain sitting balance Postural control: Right lateral lean,Posterior lean Standing balance support: Bilateral upper extremity supported Standing balance-Leahy Scale: Poor Standing balance comment: patient able to maintain brief static stand without UE support in stedy    Special needs/care consideration Wound Vac Left thigh, Skin Drains-Left medial thigh; Left proximal thigh; Left hip; Blister- left groin; Catheter entry/exit- Left leg; Eccyhmosis-arm, hand, leg/bilateral; Petechia-anterior abdomen; Rash-abdomen/left, anterior; Weeping- left hip and Designated Visitor-Linda, girlfriend, Paden Senger, mother   Previous Environmental health practitioner (from acute therapy documentation) Living Arrangements: Spouse/significant other Available Help at Discharge: Family Type of Home: House Home Layout: One level Home Access: Stairs to enter [employer] of Steps: 2 in front, 5 in back, but has a ramp in place in front now [employer] Shower/Tub: Multimedia programmer: Handicapped height Bathroom Accessibility: Yes How  Accessible: Accessible via [place]: No  Discharge Living Setting Plans for Discharge Living Setting: Patient's home Type of Home at Discharge: [place]: One level Discharge Home Access: Stairs to enter Entrance Stairs-Rails: [place] reach both Entrance Stairs-Number of Steps: 2 Discharge Bathroom Shower/Tub: Walk-in shower Discharge Bathroom Toilet: Handicapped height Discharge Bathroom Accessibility: Yes How Accessible: Accessible via walker Does the patient have any problems obtaining your medications?: No  Social/Family/Support Systems Patient Roles: Partner Contact Information: [phone] Anticipated Caregiver: Ileene Rubens (girlfriend), works from 10am-3 pm, is reaching out to family to see if they can stay with  pt. While she works.  Anticipated Caregiver's Contact Information: [phone] Ability/Limitations of Caregiver: Can provide min-mod A Caregiver Availability: Evenings only Discharge Plan Discussed with Primary Caregiver: Yes Is Caregiver In Agreement with Plan?: Yes  Goals Patient/Family Goal for Rehab: PT/OT min A Expected length of stay: 18-21 days Pt/Family Agrees to Admission and willing to participate: Yes Program Orientation Provided & Reviewed with Pt/Caregiver Including Roles  & Responsibilities: Yes  Decrease burden of Care through IP rehab admission: Specialzed equipment needs, Decrease number of caregivers, Bowel and bladder program and Patient/family education  Possible need for SNF placement upon discharge: not anticipated  Patient Condition: I have reviewed medical records from [facility], spoken with CM, and patient. I met with patient at the bedside for inpatient rehabilitation assessment.  Patient will benefit from ongoing PT and OT, can actively participate in 3 hours of therapy a day 5 days of the week, and can make measurable gains during the admission.  Patient will also benefit from the  coordinated team approach during an Inpatient Acute Rehabilitation admission.  The patient will receive intensive therapy as well as Rehabilitation physician, nursing, social worker, and care management interventions.  Due to safety, skin/wound care, disease management, medication administration and pain management the patient requires 24 hour a day rehabilitation nursing.  The patient is currently Min A+2-Mod A+2 with mobility and min A +2-Total A with basic ADLs.  Discharge setting and therapy post discharge at home with home health is anticipated.  Patient has agreed to participate in the Acute Inpatient Rehabilitation Program and will admit today.  Preadmission Screen Completed By: Genella Mech with day of admission updates by Bethel Born, 10/10/2020 10:55 AM ______________________________________________________________________   Discussed status with Dr. Ranell Patrick on 10/10/20  at 10:55 AM and received approval for admission today.  Admission Coordinator: Genella Mech, CCC-SLP with date of admission updates by Bethel Born, CCC-SLP, time 10:55 AM/Date 10/10/20    Assessment/Plan: Diagnosis: MSSA bacteremia 1. Does the need for close, 24 hr/day Medical supervision in concert with the patient's rehab needs make it unreasonable for this patient to be served in a less intensive setting? Yes 2. Co-Morbidities requiring supervision/potential complications:  1. Overweight BMI 27.01 2. Alcohol dependence 3. Alcohol hepatitis 4. Sepsis 5. Septic arthritis 3. Due to bladder management, bowel management, safety, skin/wound care, disease management, medication administration, pain management and patient education, does the patient require 24 hr/day rehab nursing? Yes 4. Does the patient require coordinated care of a physician, rehab nurse, PT, OT to address physical and functional deficits in the context of the above medical diagnosis(es)? Yes Addressing deficits in the following  areas: balance, endurance, locomotion, strength, transferring, bowel/bladder control, bathing, dressing, feeding, grooming, toileting and psychosocial support 5. Can the patient actively participate in an intensive therapy program of at least 3 hrs of therapy 5 days a week? Yes 6. The potential for patient to make measurable gains while on inpatient rehab is excellent 7. Anticipated functional outcomes upon discharge from inpatient rehab: modified independent PT, modified independent OT, independent SLP 8. Estimated rehab length of stay to reach the above functional goals is: 10-14 days 9. Anticipated discharge destination: Home 10. Overall Rehab/Functional Prognosis: excellent   MD Signature: Leeroy Cha, MD

## 2020-09-21 NOTE — TOC Progression Note (Signed)
Transition of Care [facility]) - Progression Note    Patient Details  Name: FINNIGAN WARRINER MRN: [id number] Date of Birth: 1985-10-19  [facility] CM/SW Contact  Golda Acre, RN Phone Number: 09/21/2020, 7:29 AM  Clinical Narrative:    ortho note: I reexamined the patient, neurologically he is intact distally, EHL is present.  He still has significant scrotal swelling.  Still significant swelling around his left thigh.  I have reviewed the CAT scans with interventional radiology and developed a coordinated surgical and interventional radiology effort, which will include repeat exploration of the right thigh today with incision, irrigation, debridement of the medial compartment, I will also adjust the drain that is in the anterior compartment, he has a separate multiloculated area over the anterolateral aspect of the thigh proximally, and it is not clear if this communicates with any of the currently drained areas, however we are going to plan for exploration of the medial compartment, adjustment of the drain anteriorly, and interventional radiology may place an additional drain anteriorly into the anterolateral proximal abscess if this is not decompressed with today's surgical intervention.  He already has a lateral incision, and I am planning to make a fairly large medial incision followed by a wound VAC along the medial aspect of his thigh, hopefully we can manage the anterior abscesses without making an anterior incision as well.  This continues to be a life-threatening and severe infection and hopefully we will ultimately be able to achieve some degree of surgical and medical control. PLAN: will follow post op for home and toc needs.  Expected Discharge Plan: IP Rehab Facility (CIR) Barriers to Discharge: Continued Medical Work up  Expected Discharge Plan and Services Expected Discharge Plan: IP Rehab Facility (CIR)   Discharge Planning Services: CM Consult   Living  arrangements for the past 2 months: Single Family Home                                       Social Determinants of Health (SDOH) Interventions    Readmission Risk Interventions No flowsheet data found.

## 2020-09-21 NOTE — Progress Notes (Addendum)
Subjective: Patient sitting up in bed. States leg "feel weird" but not in pain this morning. Feel like left foot swelling has decreased. Scrotum feels more comfortable. No other complaints.   Objective:  PE: VITALS:   Vitals:   09/21/20 0706 09/21/20 0738 09/21/20 0800 09/21/20 0816  BP: 118/74 119/81  120/79  Pulse: 100 98 (!) 106 (!) 106  Resp: 18  20 20   Temp: 98.4 F (36.9 C)     TempSrc: Oral     SpO2: 100%  98% 99%  Weight:      Height:       General: alert, oriented, sitting up in bed Resp: no increased respiratory effort Skin: skin sloughing with pustules at left groin MSK: Decreased swelling at left foot. Left calf continues to be TTP. Sensation intact to all aspects of left foot. + DP pulse. Dorsiflexion/Plantar flexion intact. Surgical dressing changed this morning, no drainage noted on bandages. Wound vac in place with approx 300 cc's bloody drainage in canister, good seal.   LABS  Results for orders placed or performed during the hospital encounter of 09/13/20 (from the past 24 hour(s))  CBC with Differential/Platelet     Status: Abnormal   Collection Time: 09/21/20  2:34 AM  Result Value Ref Range   WBC 11.9 (H) 4.0 - 10.5 K/uL   RBC 1.99 (L) 4.22 - 5.81 MIL/uL   Hemoglobin 6.4 (LL) 13.0 - 17.0 g/dL   HCT 11/21/20 (L) 58.5 - 27.7 %   MCV 102.0 (H) 80.0 - 100.0 fL   MCH 32.2 26.0 - 34.0 pg   MCHC 31.5 30.0 - 36.0 g/dL   RDW 82.4 (H) 23.5 - 36.1 %   Platelets 104 (L) 150 - 400 K/uL   nRBC 0.0 0.0 - 0.2 %   Neutrophils Relative % 82 %   Neutro Abs 9.8 (H) 1.7 - 7.7 K/uL   Lymphocytes Relative 12 %   Lymphs Abs 1.4 0.7 - 4.0 K/uL   Monocytes Relative 2 %   Monocytes Absolute 0.3 0.1 - 1.0 K/uL   Eosinophils Relative 0 %   Eosinophils Absolute 0.0 0.0 - 0.5 K/uL   Basophils Relative 0 %   Basophils Absolute 0.0 0.0 - 0.1 K/uL   WBC Morphology MILD LEFT SHIFT (1-5% METAS, OCC MYELO, OCC BANDS)    Immature Granulocytes 4 %   Abs Immature Granulocytes 0.43  (H) 0.00 - 0.07 K/uL   Reactive, Benign Lymphocytes PRESENT    Polychromasia PRESENT   Comprehensive metabolic panel     Status: Abnormal   Collection Time: 09/21/20  2:34 AM  Result Value Ref Range   Sodium 126 (L) 135 - 145 mmol/L   Potassium 4.8 3.5 - 5.1 mmol/L   Chloride 95 (L) 98 - 111 mmol/L   CO2 23 22 - 32 mmol/L   Glucose, Bld 173 (H) 70 - 99 mg/dL   BUN 10 6 - 20 mg/dL   Creatinine, Ser 11/21/20 (L) 0.61 - 1.24 mg/dL   Calcium 7.1 (L) 8.9 - 10.3 mg/dL   Total Protein 4.8 (L) 6.5 - 8.1 g/dL   Albumin 1.7 (L) 3.5 - 5.0 g/dL   AST 30 15 - 41 U/L   ALT 11 0 - 44 U/L   Alkaline Phosphatase 214 (H) 38 - 126 U/L   Total Bilirubin 1.5 (H) 0.3 - 1.2 mg/dL   GFR, Estimated 1.54 >00 mL/min   Anion gap 8 5 - 15  Phosphorus     Status:  None   Collection Time: 09/21/20  2:34 AM  Result Value Ref Range   Phosphorus 3.1 2.5 - 4.6 mg/dL  Magnesium     Status: None   Collection Time: 09/21/20  2:34 AM  Result Value Ref Range   Magnesium 1.7 1.7 - 2.4 mg/dL  Type and screen [facility]     Status: None (Preliminary result)   Collection Time: 09/21/20  4:56 AM  Result Value Ref Range   ABO/RH(D) O NEG    Antibody Screen NEG    Sample Expiration 09/24/2020,2359    Unit Number [id number]    Blood Component Type RBC LR PHER2    Unit division 00    Status of Unit ISSUED    Transfusion Status OK TO TRANSFUSE    Crossmatch Result      Compatible Performed at [facility], [address] [place], [place] [address]   Prepare RBC (crossmatch)     Status: None   Collection Time: 09/21/20  4:56 AM  Result Value Ref Range   Order Confirmation      ORDER PROCESSED BY BLOOD BANK Performed at [facility], [address], [place], [place] [address]     ECHO TEE  Result Date: 09/19/2020    TRANSESOPHOGEAL ECHO REPORT   Patient Name:   [name] Date of Exam: 09/19/2020 Medical Rec #:  [id number]         Height:       70.0 in  Accession #:    [id number]        Weight:       215.6 lb Date of Birth:  05-20-1986         BSA:          2.155 m Patient Age:    34 years          BP:           106/66 mmHg Patient Gender: M                 HR:           104 bpm. Exam Location:  Inpatient Procedure: Transesophageal Echo, Color Doppler and Saline Contrast Bubble Study Indications:     Bacteremia  History:         Patient has prior history of Echocardiogram examinations, most                  recent 09/14/2020. Signs/Symptoms:Shortness of Breath; Risk                  Factors:Hypertension. ETOH.  Sonographer:     Ross Ludwig RDCS (AE) Referring Phys:  [address] [place] Diagnosing Phys: Dietrich Pates MD PROCEDURE: After discussion of the risks and benefits of a TEE, an informed consent was obtained from the patient. The transesophogeal probe was passed without difficulty through the esophogus of the patient. Local oropharyngeal anesthetic was provided with Cetacaine. Sedation performed by different physician. The patient was monitored while under deep sedation. Anesthestetic sedation was provided intravenously by Anesthesiology: 227.23mg  of Propofol. Image quality was good. The patient developed no complications during the procedure. IMPRESSIONS  1. No obvious vegetations.  2. Left ventricular ejection fraction, by estimation, is 60 to 65%. The left ventricle has normal function.  3. Right ventricular systolic function is normal. The right ventricular size is normal.  4. No left atrial/left atrial appendage thrombus was detected.  5. The mitral valve is normal in  structure. Trivial mitral valve regurgitation.  6. The aortic valve is normal in structure. Aortic valve regurgitation is not visualized.  7. Agitated saline contrast bubble study was negative, with no evidence of any interatrial shunt. FINDINGS  Left Ventricle: Left ventricular ejection fraction, by estimation, is 60 to 65%. The left ventricle has normal function. The left ventricular internal  cavity size was normal in size. Right Ventricle: The right ventricular size is normal. Right ventricular systolic function is normal. Left Atrium: Left atrial size was normal in size. No left atrial/left atrial appendage thrombus was detected. Right Atrium: Right atrial size was normal in size. Pericardium: There is no evidence of pericardial effusion. Mitral Valve: The mitral valve is normal in structure. Trivial mitral valve regurgitation. Tricuspid Valve: The tricuspid valve is normal in structure. Tricuspid valve regurgitation is trivial. Aortic Valve: The aortic valve is normal in structure. Aortic valve regurgitation is not visualized. Pulmonic Valve: The pulmonic valve was normal in structure. Pulmonic valve regurgitation is not visualized. Aorta: The aortic root and ascending aorta are structurally normal, with no evidence of dilitation. IAS/Shunts: No atrial level shunt detected by color flow Doppler. Agitated saline contrast was given intravenously to evaluate for intracardiac shunting. Agitated saline contrast bubble study was negative, with no evidence of any interatrial shunt. Dietrich Pates MD Electronically signed by Dietrich Pates MD Signature Date/Time: 09/19/2020/4:02:21 PM    Final     Assessment/Plan:  Left hip septic arthritis with iliopsoas, left gluteal, and left medial thigh abscess - left hip total arthroplasty with cement interposition spacer on 2/24 - left thigh irrigation and debridement with wound vac placement 3/1 - post op day 1  Weightbearing: WBAT LLE Insicional and dressing care: twice a day dressing changes to incision with gauze and abds VTE prophylaxis: will default to primary given coagulopathy from liver disease and thrombocytopenia, SCD's Pain control: oxycodone - Dr. Dion Saucier spoke with IR yesterday plan is to repeat CT scan tomorrow for possible drain placement Friday if needed - Consulted general surgery regarding drainage at left thigh for possible abscess drainage vs  wound care  Addendum: Since patient is going back to the OR with general surgery tomorrow, will go ahead and get repeat CT scan today so that we have information tomorrow regarding need for further irrigation and debridement while already in the OR tomorrow  Contact information:   Weekdays 8-5 Janine Ores, PA-C [phone] A fter hours and holidays please check [email or web address] for group call information for [facility]  Armida Sans 09/21/2020, 8:44 AM

## 2020-09-21 NOTE — Progress Notes (Signed)
Inpatient Rehab Admissions Coordinator:   Met with patient and girlfriend. at bedside to discuss potential CIR admission. Pt. Stated interest, will talk to other family members to work out 24/7 support at d/c. Will pursue for potential admit next week, pending bed availability.   Clemens Catholic, [place], [place] Admissions Coordinator  [phone] ([place]) [phone] (office)

## 2020-09-21 NOTE — Progress Notes (Signed)
Occupational Therapy Treatment Patient Details Name: [name] MRN: [id number] DOB: 30-May-1986 Today's Date: 09/21/2020    History of present illness [name] is a 35 y.o. male with medical history significant of AVN of hip ( originally was scheduled for a THA this coming March2022), HTN, GERD. Recent admission for COVID , acute renal failure and alcoholic hepatitis (08/22/20- 08/29/20). He was ok for a few days after discharge, but he began to feel very tired, weak, left thigh and leg pain and progressed over the last 2 weeks. He reports that the left thigh had become increasingly swollen and tender to touch. He has had a blister rupture on the inner thigh in the groin area. These symptoms increased through 2 days ago when he was no longer ago to walk without great pain. 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 Left thigh I & D and wound vac placement 3/1.   OT comments  Treatment focused on promoting functional mobility and activity tolerance. Patient mod x 2 to transfer to edge of bed and total assist to return to supine. Tolerated approx 5 min sitting edge of bed and unable to attempt stand due tp pain. Patient instructed on use of theraband for shoulder external rotation and tricep extension exercises.   Follow Up Recommendations       Equipment Recommendations  None recommended by OT    Recommendations for Other Services Rehab consult    Precautions / Restrictions Precautions Precautions: Fall;Posterior Hip Precaution Comments: watch HR, pt rests in 100-110's Restrictions Weight Bearing Restrictions: No LLE Weight Bearing: Weight bearing as  tolerated Other Position/Activity Restrictions: 2 drains in Lt anterior thigh and wound vac       Mobility Bed Mobility Overal bed mobility: Needs Assistance Bed Mobility: Supine to Sit;Sit to Supine     Supine to sit: Mod assist;+2 for physical assistance;+2 for safety/equipment;HOB elevated Sit to supine: +2 for safety/equipment;+2 for physical assistance;Total assist   General bed mobility comments: Mod assist for LLE, assistance with trunk lift off and use of bed pad to pivot hips to edge of bed. Patient able to tolerate approx 5 min edge of bed. Scooted towards head of bed with therapist and nurse assisting with pulling on bed pad and positioning LEs. Total assist to return to supine.    Transfers Overall transfer level: Needs assistance                    Balance   Sitting-balance support: Feet unsupported;Bilateral upper extremity supported Sitting balance-Leahy Scale: Poor Sitting balance - Comments: reliant on bil UE support and close guard from therapist                                   ADL either performed or assessed with clinical judgement   ADL                                               Vision       Perception  Praxis      Cognition Arousal/Alertness: Awake/alert Behavior During Therapy: WFL for tasks assessed/performed Overall Cognitive Status: Within Functional Limits for tasks assessed                                          Exercises Other Exercises Other Exercises: Educated on use of theraband for external rotation (to assist with potential impingement) and tricep extension exercises. Other Exercises: encouraged ankle pumps and toe curls/wiggling in bed,   Shoulder Instructions       General Comments      Pertinent Vitals/ Pain       Pain Assessment: Faces Pain Score: 10-Worst pain ever Pain Location: Lt hip and scrotal edema Pain Descriptors / Indicators:  Discomfort;Grimacing;Operative site guarding;Guarding Pain Intervention(s): Limited activity within patient's tolerance;Monitored during session;Premedicated before session  Home Living                                          Prior Functioning/Environment              Frequency  Min 2X/week        Progress Toward Goals  OT Goals(current goals can now be found in the care plan section)  Progress towards OT goals: Progressing toward goals  Acute Rehab OT Goals Patient Stated Goal: to be able to walk again and more independent OT Goal Formulation: With patient Time For Goal Achievement: 10/02/20 Potential to Achieve Goals: Good  Plan Discharge plan remains appropriate    Co-evaluation          OT goals addressed during session: Strengthening/ROM (activity tolerance, functional mobility)      AM-PAC OT "6 Clicks" Daily Activity     Outcome Measure   Help from another person eating meals?: None Help from another person taking care of personal grooming?: A Little Help from another person toileting, which includes using toliet, bedpan, or urinal?: Total Help from another person bathing (including washing, rinsing, drying)?: A Lot Help from another person to put on and taking off regular upper body clothing?: A Little Help from another person to put on and taking off regular lower body clothing?: Total 6 Click Score: 14    End of Session Equipment Utilized During Treatment: Gait belt;Rolling walker  OT Visit Diagnosis: Other abnormalities of gait and mobility (R26.89);Muscle weakness (generalized) (M62.81);Pain Pain - Right/Left: Left Pain - part of body: Hip   Activity Tolerance Patient limited by pain   Patient Left in bed;with call bell/phone within reach;with family/visitor present   Nurse Communication Mobility status        Time: 5697-9480 OT Time Calculation (min): 21 min  Charges: OT General Charges $OT Visit: 1 Visit OT  Treatments $Therapeutic Activity: 8-22 mins  Waldron Session, OTR/L Acute Care Rehab Services  Office [phone] Pager: [phone]    Kelli Churn 09/21/2020, 3:31 PM

## 2020-09-21 NOTE — Progress Notes (Signed)
Nutrition Follow-up  DOCUMENTATION CODES:   Not applicable  INTERVENTION:  - girlfriend to bring in [place] shakes that patient was consuming PTA. - will order Boost Breeze BID, each supplement provides 250 kcal and 9 grams of protein. - will order 30 ml Prosource Plus BID, each supplement provides 100 kcal and 15 grams protein.  - will order Juven BID, each packet provides 95 calories, 2.5 grams of protein (collagen), and 9.8 grams of carbohydrate (3 grams sugar); also contains 7 grams of L-arginine and L-glutamine, 300 mg vitamin C, 15 mg vitamin E, 1.2 mcg vitamin B-12, 9.5 mg zinc, 200 mg calcium, and 1.5 g  Calcium Beta-hydroxy-Beta-methylbutyrate to support wound healing.   NUTRITION DIAGNOSIS:   Increased nutrient needs related to chronic illness as evidenced by estimated needs. -ongoing  GOAL:   Patient will meet greater than or equal to 90% of their needs -likely unmet on average  MONITOR:   PO intake,Supplement acceptance,Labs,Weight trends,Skin  ASSESSMENT:   35 y.o. male who complains of acute on chronic left hip pain. He was scheduled for hip replacement surgery in early March, and has had a series of progressive medical complications including thrombocytopenia, liver failure, COVID-19, progressive inability to ambulate, as well as kidney failure.  He has had progressive edema in the left lower extremity.  He denies fevers or chills.  He denies IV drug use.  He has been decreasing his alcohol intake in preparation for his hip replacement surgery.  He has progressed now to the point where he cannot place weight on his left lower extremity, and is unable to function.  He was brought into the emergency room with severe anemia, requiring blood transfusion, jaundice, and left lower extremity swelling.  He has mainly been consuming 50-75% at meals over the past week. Yesterday he was NPO from 0430-2000 meaning he missed all meals.   He does not like Ensure and was  drinking [employer] Protein shakes at home PTA, per RN's report to RD last week; this was reported to RN by patient's girlfriend.   Patient sleeping at the time of RD visit this AM and no family or visitors were present. Visualized breakfast tray with ~70% completion.   Able to talk with RN who had patient yesterday and today. He reports that he has not seen any Fair[employer] protein shakes in patient's room.  Weight has been highly variable since admission on 3/2. Non-pitting edema to RLE and mild pitting edema to LLE documented in the edema section of flow sheet.   Yesterday he underwent excisional debridement of L thigh skin, subcutaneous tissue, muscle, fascia, and deep abscess.  On 2/24 he underweight total L hip arthroplasty with cement spacer placement d/t septic arthritis.  Plan for return to OR tomorrow for L thigh I&D.      Labs reviewed; Na: 128 mmol/l, Cl: 95 mmol/l, creatinine: 0.34 mg/dl, Ca: 7.1 mg/dl, Alk Phos elevated.  Medications reviewed; 100 mg colace BID, 1 mg folvite/day, 2 g IV Mg sulfate x1 run 3/2, 1 tablet multivitamin with minerals/day, 40 mg oral protonix BID, 100 mg thiamine/day.    Diet Order:   Diet Order            Diet NPO time specified Except for: Sips with Meds, Ice Chips  Diet effective midnight           Diet regular Room service appropriate? Yes; Fluid consistency: Thin  Diet effective now  EDUCATION NEEDS:   No education needs have been identified at this time  Skin:  Skin Assessment: Skin Integrity Issues: Skin Integrity Issues:: Incisions Incisions: L thigh and groin (3/1)  Last BM:  2/27  Height:   Ht Readings from Last 1 Encounters:  09/19/20 5' 10"  (1.778 m)    Weight:   Wt Readings from Last 1 Encounters:  09/21/20 93.9 kg    Estimated Nutritional Needs:  Kcal:  2200-2400 kcal Protein:  125-140 grams Fluid:  >/= 2.3 L/day      Jarome Matin, MS, RD, LDN, CNSC Inpatient Clinical Dietitian RD pager  # available in AMION  After hours/weekend pager # available in [facility]

## 2020-09-22 ENCOUNTER — Inpatient Hospital Stay (HOSPITAL_COMMUNITY): Payer: 59 | Admitting: Registered Nurse

## 2020-09-22 ENCOUNTER — Encounter (HOSPITAL_COMMUNITY): Payer: Self-pay | Admitting: Orthopedic Surgery

## 2020-09-22 ENCOUNTER — Encounter (HOSPITAL_COMMUNITY)
Admission: EM | Disposition: A | Discharge status: Rehabilitation Facility | Payer: Self-pay | Source: Home / Self Care | Attending: Internal Medicine

## 2020-09-22 DIAGNOSIS — K7030 Alcoholic cirrhosis of liver without ascites: Secondary | ICD-10-CM | POA: Diagnosis not present

## 2020-09-22 DIAGNOSIS — F1029 Alcohol dependence with unspecified alcohol-induced disorder: Secondary | ICD-10-CM | POA: Diagnosis not present

## 2020-09-22 DIAGNOSIS — L03116 Cellulitis of left lower limb: Secondary | ICD-10-CM

## 2020-09-22 DIAGNOSIS — K7010 Alcoholic hepatitis without ascites: Secondary | ICD-10-CM | POA: Diagnosis not present

## 2020-09-22 DIAGNOSIS — L02416 Cutaneous abscess of left lower limb: Secondary | ICD-10-CM | POA: Diagnosis not present

## 2020-09-22 HISTORY — PX: IRRIGATION AND DEBRIDEMENT ABSCESS: SHX5252

## 2020-09-22 LAB — TYPE AND SCREEN
ABO/RH(D): O NEG
Antibody Screen: NEGATIVE
Unit division: 0

## 2020-09-22 LAB — BPAM RBC
Blood Product Expiration Date: 202204032359
ISSUE DATE / TIME: 202203020640
Unit Type and Rh: 9500

## 2020-09-22 SURGERY — IRRIGATION AND DEBRIDEMENT ABSCESS
Anesthesia: General | Laterality: Left

## 2020-09-22 MED ORDER — LIDOCAINE HCL (PF) 2 % IJ SOLN
INTRAMUSCULAR | Status: AC
  Filled 2020-09-22: qty 5

## 2020-09-22 MED ORDER — FENTANYL CITRATE (PF) 100 MCG/2ML IJ SOLN
INTRAMUSCULAR | Status: DC | PRN
  Administered 2020-09-22: 50 ug via INTRAVENOUS
  Administered 2020-09-22: 100 ug via INTRAVENOUS
  Administered 2020-09-22 (×2): 50 ug via INTRAVENOUS

## 2020-09-22 MED ORDER — HYDROMORPHONE HCL 1 MG/ML IJ SOLN
INTRAMUSCULAR | Status: AC
  Filled 2020-09-22: qty 1

## 2020-09-22 MED ORDER — PROPOFOL 10 MG/ML IV BOLUS
INTRAVENOUS | Status: DC | PRN
  Administered 2020-09-22: 200 mg via INTRAVENOUS

## 2020-09-22 MED ORDER — DEXAMETHASONE SODIUM PHOSPHATE 10 MG/ML IJ SOLN
INTRAMUSCULAR | Status: DC | PRN
  Administered 2020-09-22: 4 mg via INTRAVENOUS

## 2020-09-22 MED ORDER — HYDROMORPHONE HCL 1 MG/ML IJ SOLN
0.2500 mg | INTRAMUSCULAR | Status: DC | PRN
  Administered 2020-09-22: 0.5 mg via INTRAVENOUS

## 2020-09-22 MED ORDER — MIDAZOLAM HCL 2 MG/2ML IJ SOLN
INTRAMUSCULAR | Status: AC
  Filled 2020-09-22: qty 2

## 2020-09-22 MED ORDER — BUPIVACAINE HCL 0.25 % IJ SOLN
INTRAMUSCULAR | Status: DC | PRN
  Administered 2020-09-22: 10 mL

## 2020-09-22 MED ORDER — LACTATED RINGERS IV SOLN: INTRAVENOUS | Status: DC

## 2020-09-22 MED ORDER — ONDANSETRON HCL 4 MG/2ML IJ SOLN
INTRAMUSCULAR | Status: DC | PRN
  Administered 2020-09-22: 4 mg via INTRAVENOUS

## 2020-09-22 MED ORDER — MEPERIDINE HCL 50 MG/ML IJ SOLN: 6.2500 mg | INTRAMUSCULAR | Status: DC | PRN

## 2020-09-22 MED ORDER — CHLORHEXIDINE GLUCONATE 0.12 % MT SOLN
15.0000 mL | Freq: Once | OROMUCOSAL | Status: AC
  Administered 2020-09-22: 15 mL via OROMUCOSAL

## 2020-09-22 MED ORDER — PROMETHAZINE HCL 25 MG/ML IJ SOLN: 6.2500 mg | INTRAMUSCULAR | Status: DC | PRN

## 2020-09-22 MED ORDER — MIDAZOLAM HCL 5 MG/5ML IJ SOLN
INTRAMUSCULAR | Status: DC | PRN
  Administered 2020-09-22: 2 mg via INTRAVENOUS

## 2020-09-22 MED ORDER — DEXAMETHASONE SODIUM PHOSPHATE 10 MG/ML IJ SOLN
INTRAMUSCULAR | Status: AC
  Filled 2020-09-22: qty 1

## 2020-09-22 MED ORDER — LIDOCAINE 2% (20 MG/ML) 5 ML SYRINGE
INTRAMUSCULAR | Status: DC | PRN
  Administered 2020-09-22: 100 mg via INTRAVENOUS

## 2020-09-22 MED ORDER — 0.9 % SODIUM CHLORIDE (POUR BTL) OPTIME
TOPICAL | Status: DC | PRN
  Administered 2020-09-22: 1000 mL

## 2020-09-22 MED ORDER — OXYCODONE HCL 5 MG PO TABS: 5.0000 mg | ORAL_TABLET | Freq: Once | ORAL | Status: DC | PRN

## 2020-09-22 MED ORDER — ONDANSETRON HCL 4 MG/2ML IJ SOLN
INTRAMUSCULAR | Status: AC
  Filled 2020-09-22: qty 2

## 2020-09-22 MED ORDER — OXYCODONE HCL 5 MG/5ML PO SOLN: 5.0000 mg | Freq: Once | ORAL | Status: DC | PRN

## 2020-09-22 MED ORDER — PROPOFOL 10 MG/ML IV BOLUS
INTRAVENOUS | Status: AC
  Filled 2020-09-22: qty 20

## 2020-09-22 MED ORDER — BUPIVACAINE HCL 0.25 % IJ SOLN
INTRAMUSCULAR | Status: AC
  Filled 2020-09-22: qty 1

## 2020-09-22 MED ORDER — FENTANYL CITRATE (PF) 250 MCG/5ML IJ SOLN
INTRAMUSCULAR | Status: AC
  Filled 2020-09-22: qty 5

## 2020-09-22 SURGICAL SUPPLY — 39 items
AGENT HMST 10 BLLW SHRT CANN (HEMOSTASIS) ×1
APL SKNCLS STERI-STRIP NONHPOA (GAUZE/BANDAGES/DRESSINGS) ×1
APPLIER CLIP 11 MED OPEN (CLIP) ×2
APR CLP MED 11 20 MLT OPN (CLIP) ×1
BENZOIN TINCTURE PRP APPL 2/3 (GAUZE/BANDAGES/DRESSINGS) ×2 IMPLANT
BLADE HEX COATED 2.75 (ELECTRODE) ×2 IMPLANT
BLADE SURG 15 STRL LF DISP TIS (BLADE) ×1 IMPLANT
BLADE SURG 15 STRL SS (BLADE) ×2
BLADE SURG SZ10 CARB STEEL (BLADE) ×2 IMPLANT
CANISTER WOUNDNEG PRESSURE 500 (CANNISTER) ×1 IMPLANT
CLIP APPLIE 11 MED OPEN (CLIP) IMPLANT
COVER SURGICAL LIGHT HANDLE (MISCELLANEOUS) ×2 IMPLANT
COVER WAND RF STERILE (DRAPES) IMPLANT
DECANTER SPIKE VIAL GLASS SM (MISCELLANEOUS) ×2 IMPLANT
DRAPE LAPAROTOMY TRNSV 102X78 (DRAPES) ×2 IMPLANT
DRSG MEPITEL 8X12 (GAUZE/BANDAGES/DRESSINGS) ×1 IMPLANT
DRSG VAC ATS MED SENSATRAC (GAUZE/BANDAGES/DRESSINGS) ×1 IMPLANT
ELECT REM PT RETURN 15FT ADLT (MISCELLANEOUS) ×2 IMPLANT
GAUZE 4X4 16PLY RFD (DISPOSABLE) ×2 IMPLANT
GAUZE SPONGE 4X4 12PLY STRL (GAUZE/BANDAGES/DRESSINGS) ×2 IMPLANT
GLOVE SURG UNDER POLY LF SZ7 (GLOVE) ×2 IMPLANT
GOWN STRL REUS W/TWL LRG LVL3 (GOWN DISPOSABLE) ×2 IMPLANT
GOWN STRL REUS W/TWL XL LVL3 (GOWN DISPOSABLE) ×4 IMPLANT
HEMOSTAT HEMOBLAST BELLOWS (HEMOSTASIS) ×1 IMPLANT
KIT BASIN OR (CUSTOM PROCEDURE TRAY) ×2 IMPLANT
KIT TURNOVER KIT A (KITS) ×2 IMPLANT
MARKER SKIN DUAL TIP RULER LAB (MISCELLANEOUS) ×2 IMPLANT
NDL HYPO 25X1 1.5 SAFETY (NEEDLE) ×1 IMPLANT
NEEDLE HYPO 22GX1.5 SAFETY (NEEDLE) ×2 IMPLANT
NEEDLE HYPO 25X1 1.5 SAFETY (NEEDLE) ×2 IMPLANT
NS IRRIG 1000ML POUR BTL (IV SOLUTION) ×2 IMPLANT
PACK BASIC VI WITH GOWN DISP (CUSTOM PROCEDURE TRAY) ×2 IMPLANT
PENCIL SMOKE EVACUATOR (MISCELLANEOUS) IMPLANT
SOL PREP POV-IOD 4OZ 10% (MISCELLANEOUS) ×2 IMPLANT
STRIP CLOSURE SKIN 1/2X4 (GAUZE/BANDAGES/DRESSINGS) ×2 IMPLANT
SUT VIC AB 4-0 SH 18 (SUTURE) ×2 IMPLANT
SYR CONTROL 10ML LL (SYRINGE) ×2 IMPLANT
TOWEL OR 17X26 10 PK STRL BLUE (TOWEL DISPOSABLE) ×2 IMPLANT
WATER STERILE IRR 1000ML POUR (IV SOLUTION) ×2 IMPLANT

## 2020-09-22 NOTE — Progress Notes (Signed)
[facility]  PROGRESS NOTE  [name] ZOX:[id number] DOB: 11-Dec-1985 DOA: 09/13/2020 PCP: [name], PA   Brief HPI:   35 year old male with past medical history of AVN of the hip, hypertension, GERD he was recently admitted on 08/22/2020 for COVID-19 and was discharged on 08/29/2020.  2 days after discharge patient began to feel extremely weak and tired with symptoms progressing over next 2 weeks.  Patient developed left thigh rash and leg pain with swelling and tenderness to touch.  Patient was seen at PCP office and was told to go to the ED immediately.  He was found to have left iliopsoas and left gluteal abscess with diffuse swelling and left hip effusion on CT scan.  General surgery was consulted.  Blood cultures positive for MSSA bacteremia.  He underwent drainage of the abscess with 3 drain placement by IR in his iliopsoas muscle and multiple abscesses in the legs.  On 09/15/2020 had left hip interposition arthroplasty with antibiotic spacer placement and debridement and developed decompensated septic shock with refractory hypotension despite 2 units PRBC, 2.2 L of crystalloid and 500 cc of albumin.  He was admitted to ICU with vasopressor support.  ID has been consulted and recommend 6 weeks of IV cefazolin. Patient also had scrotal edema anasarca in the setting of IV fluid resuscitation.  Ultrasound was unremarkable for abscess but showed marked asymmetric left scrotal wall thickening and fluid with increased blood flow consistent with soft tissue infection.      Subjective   Patient seen and examined, plan for left groin incision drainage today per general surgery.   Assessment/Plan:     1. Left hip septic arthritis/left thigh fluid collection-patient underwent drainage of the abscess with drain placement x3, left retroperitoneum, medial left thigh, lateral left thigh and IR on 09/14/2020.  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  Patient underwent left thigh irrigation and debridement with wound VAC placement on 09/20/2020. General surgery has seen the patient and plan to take to the OR today for left groin incision and drainage. 2. MSSA bacteremia-patient would have emesis of bacteremia, likely from complicated iliopsoas/gluteal/thigh abscess.  Infectious disease was consulted.  Patient started on IV cefazolin.  Patient initially underwent TTE which did not show any vegetation.  As it was difficult study so he underwent transesophageal echocardiogram on 09/19/2020, which showed no vegetation.  ID is following, will need prolonged course of antibiotics.  Duration of antibiotics per ID. 3. Symptomatic anemia-patient presented with hemoglobin/hematocrit of 6.2/18.9, he received 4 units PRBC.  Anemia panel showed iron level 44, U IBC 131, TIBC 175, saturation is 25%.  LDH was 308, haptoglobin pending, his hemoglobin was 6.4 yesterday, received another unit of PRBC yesterday this morning hemoglobin is 7.8. 4. History of alcoholic hepatitis/liver cirrhosis-patient has history of alcohol abuse, he was started on prednisolone for alcoholic hepatitis and was being tapered off.  Believed to be secondary to emesis of bacteremia prednisone was discontinued.  LFTs are back to normal. 5. Hyponatremia-likely from underlying liver cirrhosis, likely chronic.  We will continue to monitor sodium. 6. Scrotal edema-patient developed scrotal edema, scrotal ultrasound showed no evidence of testicular mass or torsion, mild asymmetric Scrotal wall thickening and fluid with increased blood flow.  Likely from soft tissue infection. 7. Thrombocytopenia-platelet count improved to 104,000.     COVID-19 Labs  No results for input(s): DDIMER, FERRITIN, LDH, CRP in the last 72  hours.  Lab Results  Component Value Date   SARSCOV2NAA NEGATIVE 09/13/2020   SARSCOV2NAA POSITIVE (A) 08/22/2020   SARSCOV2NAA DETECTED (A) 02/24/2019     Scheduled medications:   . [MAR Hold] (feeding supplement) PROSource Plus  30 mL Oral BID BM  . [MAR Hold] sodium chloride   Intravenous Once  . [MAR Hold] carvedilol  6.25 mg Oral BID WC  . [MAR Hold] Chlorhexidine Gluconate Cloth  6 each Topical Daily  . [MAR Hold] docusate sodium  100 mg Oral BID  . [MAR Hold] feeding supplement  1 Container Oral BID BM  . [MAR Hold] folic acid  1 mg Oral Daily  . [MAR Hold] lactulose  10 g Oral BID  . [MAR Hold] mouth rinse  15 mL Mouth Rinse BID  . [MAR Hold] metroNIDAZOLE  500 mg Oral Q8H  . [MAR Hold] multivitamin with minerals  1 tablet Oral Daily  . [MAR Hold] nutrition supplement (JUVEN)  1 packet Oral BID BM  . [MAR Hold] pantoprazole  40 mg Oral BID  . [MAR Hold] sertraline  25 mg Oral Daily  . [MAR Hold] sodium chloride flush  5 mL Intracatheter Q8H  . [facility] Hold] thiamine  100 mg Oral Daily         CBG: Recent Labs  Lab 09/15/20 2116 09/15/20 2152  GLUCAP 61* 86    SpO2: 94 % O2 Flow Rate (L/min): 9 L/min    CBC: Recent Labs  Lab 09/17/20 0511 09/18/20 0303 09/19/20 0305 09/20/20 0244 09/21/20 0234 09/21/20 1117  WBC 12.1* 15.1* 14.5* 10.7* 11.9*  --   NEUTROABS 9.6* 12.2* 11.8* 8.3* 9.8*  --   HGB 7.5* 9.8* 8.8* 7.7* 6.4* 7.8*  HCT 22.5* 30.2* 26.6* 24.0* 20.3* 23.5*  MCV 96.6 97.7 98.5 100.0 102.0*  --   PLT 72* 86* 85* 99* 104*  --     Basic Metabolic Panel: Recent Labs  Lab 09/17/20 0511 09/18/20 0303 09/19/20 0305 09/20/20 0244 09/21/20 0234  NA 131* 129* 129* 131* 126*  K 4.3 4.4 4.4 4.3 4.8  CL 102 98 96* 95* 95*  CO2 GLUCOSE 127* 91 100* 96 173*  BUN CREATININE 0.38* 0.44* 0.41* 0.43* 0.34*  CALCIUM 7.2* 7.5* 7.4* 7.4* 7.1*  MG 2.0 1.9 1.9 1.9 1.7  PHOS 1.6* 1.6* 3.4 2.7 3.1     Liver Function  Tests: Recent Labs  Lab 09/17/20 0511 09/18/20 0303 09/19/20 0305 09/20/20 0244 09/21/20 0234  AST 43* 61* 39 30 30  ALT ALKPHOS 165* 270* 244* 240* 214*  BILITOT 1.3* 1.7* 1.7* 1.6* 1.5*  PROT 4.9* 5.7* 5.2* 5.2* 4.8*  ALBUMIN 2.0* 2.3* 2.0* 1.9* 1.7*     Antibiotics: Anti-infectives (From admission, onward)   Start     Dose/Rate Route Frequency Ordered Stop   09/18/20 1400  [MAR Hold]  metroNIDAZOLE (FLAGYL) tablet 500 mg        (MAR Hold since Thu 09/22/2020 at 1354.Hold Reason: Transfer to a Procedural area.)   500 mg Oral Every 8 hours 09/18/20 0927     09/15/20 1715  tobramycin (NEBCIN) powder  Status:  Discontinued          As needed 09/15/20 1715 09/15/20 2043   09/15/20 1715  vancomycin (VANCOCIN) powder  Status:  Discontinued  As needed 09/15/20 1716 09/15/20 2043   09/14/20 0600  [MAR Hold]  ceFAZolin (ANCEF) IVPB 2g/100 mL premix        (MAR Hold since Thu 09/22/2020 at 1354.Hold Reason: Transfer to a Procedural area.)   2 g 200 mL/hr over 30 Minutes Intravenous Every 8 hours 09/14/20 0359     09/13/20 2200  vancomycin (VANCOREADY) IVPB 1500 mg/300 mL  Status:  Discontinued        1,500 mg 150 mL/hr over 120 Minutes Intravenous Every 12 hours 09/13/20 1636 09/14/20 0359   09/13/20 1500  ceFEPIme (MAXIPIME) 2 g in sodium chloride 0.9 % 100 mL IVPB  Status:  Discontinued        2 g 200 mL/hr over 30 Minutes Intravenous Every 8 hours 09/13/20 1437 09/14/20 0359   09/13/20 1130  vancomycin (VANCOREADY) IVPB 1750 mg/350 mL        1,750 mg 175 mL/hr over 120 Minutes Intravenous  Once 09/13/20 1107 09/13/20 1414   09/13/20 1045  ceFAZolin (ANCEF) IVPB 1 g/50 mL premix        1 g 100 mL/hr over 30 Minutes Intravenous  Once 09/13/20 1031 09/13/20 1307       DVT prophylaxis: SCDs  Code Status: Full code  Family Communication: Discussed with patient wife at bedside   Consultants:  General surgery  Orthopedics  Infectious  disease  Interventional radiology  Procedures:  TOTAL HIP ARTHROPLASTY WITH CEMENT INTERPOSITION SPACING for septic hip arthritis from septicemia in the setting of alcoholic AVN of the left hip  Drainage of the abscess with 3 drain placement by IR in iliopsoas muscle and multiple abscesses in the legs     Objective   Vitals:   09/22/20 1125 09/22/20 1200 09/22/20 1300 09/22/20 1344  BP:  115/73 113/62 113/62  Pulse:  (!) 117 (!) 114 (!) 117  Resp:  (!) 21 (!) 22 18  Temp: 99.1 F (37.3 C)   99.8 F (37.7 C)  TempSrc: Axillary   Oral  SpO2:  92% 92% 94%  Weight:      Height:        Intake/Output Summary (Last 24 hours) at 09/22/2020 1450 Last data filed at 09/22/2020 0647 Gross per 24 hour  Intake 286.91 ml  Output 1990 ml  Net -1703.09 ml    03/01 1901 - 03/03 0700 In: 1180.8 [I.V.:30] Out: 2832 [Urine:1500; Drains:1332]  Filed Weights   09/19/20 0500 09/19/20 0843 09/21/20 0500  Weight: 91.8 kg 97.8 kg 93.9 kg    Physical Examination:    General-appears in no acute distress  Heart-S1-S2, regular, no murmur auscultated  Lungs-clear to auscultation bilaterally, no wheezing or crackles auscultated  Abdomen-soft, nontender, no organomegaly  Extremities-left upper extremity edematous, no erythema noted.  Wound VAC in place.  Neuro-alert, oriented x3, no focal deficit noted   Status is: Inpatient  Dispo: The patient is from: Home              Anticipated d/c is to: Skilled nursing facility              Anticipated d/c date is: 09/29/2020              Patient currently not stable for discharge  Barrier to discharge-ongoing management for iliopsoas abscess       Data Reviewed:   Recent Results (from the past 240 hour(s))  Culture, blood (routine x 2)     Status: Abnormal   Collection Time: 09/13/20 10:45 AM  Specimen: BLOOD  Result Value Ref Range Status   Specimen Description   Final    BLOOD RIGHT ANTECUBITAL Performed at [facility], [address] [place], [place] [address]    Special Requests   Final    BOTTLES DRAWN AEROBIC AND ANAEROBIC Blood Culture adequate volume Performed at [facility], [address], [place], [place] [address]    Culture  Setup Time   Final    IN BOTH AEROBIC AND ANAEROBIC BOTTLES GRAM POSITIVE COCCI CRITICAL RESULT CALLED TO, READ BACK BY AND VERIFIED WITHErling Cruz [facility] 09/13/20 0347 JDW Performed at [facility], [address] [place], [place] [address]    Culture STAPHYLOCOCCUS AUREUS (A)  Final   Report Status 09/16/2020 FINAL  Final   Organism ID, Bacteria STAPHYLOCOCCUS AUREUS  Final      Susceptibility   Staphylococcus aureus - MIC*    CIPROFLOXACIN <=0.5 SENSITIVE Sensitive     ERYTHROMYCIN <=0.25 SENSITIVE Sensitive     GENTAMICIN <=0.5 SENSITIVE Sensitive     OXACILLIN <=0.25 SENSITIVE Sensitive     TETRACYCLINE <=1 SENSITIVE Sensitive     VANCOMYCIN <=0.5 SENSITIVE Sensitive     TRIMETH/SULFA <=10 SENSITIVE Sensitive     CLINDAMYCIN <=0.25 SENSITIVE Sensitive     RIFAMPIN <=0.5 SENSITIVE Sensitive     Inducible Clindamycin NEGATIVE Sensitive     * STAPHYLOCOCCUS AUREUS  Blood Culture ID Panel (Reflexed)     Status: Abnormal   Collection Time: 09/13/20 10:45 AM  Result Value Ref Range Status   Enterococcus faecalis NOT DETECTED NOT DETECTED Final   Enterococcus Faecium NOT DETECTED NOT DETECTED Final   Listeria monocytogenes NOT DETECTED NOT DETECTED Final   Staphylococcus species DETECTED (A) NOT DETECTED Final    Comment: CRITICAL RESULT CALLED TO, READ BACK BY AND VERIFIED WITH: E JACKSON PHARMD 09/13/20 0347 JDW    Staphylococcus aureus (BCID) DETECTED (A) NOT DETECTED Final    Comment: CRITICAL RESULT CALLED TO, READ BACK BY AND VERIFIED WITH: E JACKSON PHARMD 09/13/20 0347 JDW    Staphylococcus epidermidis NOT DETECTED NOT DETECTED Final   Staphylococcus lugdunensis NOT DETECTED NOT DETECTED Final    Streptococcus species NOT DETECTED NOT DETECTED Final   Streptococcus agalactiae NOT DETECTED NOT DETECTED Final   Streptococcus pneumoniae NOT DETECTED NOT DETECTED Final   Streptococcus pyogenes NOT DETECTED NOT DETECTED Final   A.calcoaceticus-baumannii NOT DETECTED NOT DETECTED Final   Bacteroides fragilis NOT DETECTED NOT DETECTED Final   Enterobacterales NOT DETECTED NOT DETECTED Final   Enterobacter cloacae complex NOT DETECTED NOT DETECTED Final   Escherichia coli NOT DETECTED NOT DETECTED Final   Klebsiella aerogenes NOT DETECTED NOT DETECTED Final   Klebsiella oxytoca NOT DETECTED NOT DETECTED Final   Klebsiella pneumoniae NOT DETECTED NOT DETECTED Final   Proteus species NOT DETECTED NOT DETECTED Final   Salmonella species NOT DETECTED NOT DETECTED Final   Serratia marcescens NOT DETECTED NOT DETECTED Final   Haemophilus influenzae NOT DETECTED NOT DETECTED Final   Neisseria meningitidis NOT DETECTED NOT DETECTED Final   Pseudomonas aeruginosa NOT DETECTED NOT DETECTED Final   Stenotrophomonas maltophilia NOT DETECTED NOT DETECTED Final   Candida albicans NOT DETECTED NOT DETECTED Final   Candida auris NOT DETECTED NOT DETECTED Final   Candida glabrata NOT DETECTED NOT DETECTED Final   Candida krusei NOT DETECTED NOT DETECTED Final   Candida parapsilosis NOT DETECTED NOT DETECTED Final   Candida tropicalis NOT DETECTED NOT DETECTED Final  Cryptococcus neoformans/gattii NOT DETECTED NOT DETECTED Final   Meth resistant mecA/C and MREJ NOT DETECTED NOT DETECTED Final    Comment: Performed at [facility], [address], [place], [place] [address]  Culture, blood (routine x 2)     Status: Abnormal   Collection Time: 09/13/20 11:20 AM   Specimen: BLOOD  Result Value Ref Range Status   Specimen Description   Final    BLOOD RIGHT ANTECUBITAL Performed at [facility], [address] [place], [place] [address]    Special Requests   Final    BOTTLES  DRAWN AEROBIC AND ANAEROBIC Blood Culture adequate volume Performed at [facility], [address] [place], [place] [address]    Culture  Setup Time   Final    IN BOTH AEROBIC AND ANAEROBIC BOTTLES GRAM POSITIVE COCCI CRITICAL VALUE NOTED.  VALUE IS CONSISTENT WITH PREVIOUSLY REPORTED AND CALLED VALUE.    Culture (A)  Final    STAPHYLOCOCCUS AUREUS SUSCEPTIBILITIES PERFORMED ON PREVIOUS CULTURE WITHIN THE LAST 5 DAYS. Performed at [facility], [address], [place], [place] [address]    Report Status 09/16/2020 FINAL  Final  Resp Panel by RT-PCR (Flu A&B, Covid) Nasopharyngeal Swab     Status: None   Collection Time: 09/13/20  2:01 PM   Specimen: Nasopharyngeal Swab; Nasopharyngeal(NP) swabs in vial transport medium  Result Value Ref Range Status   SARS Coronavirus 2 by RT PCR NEGATIVE NEGATIVE Final    Comment: (NOTE) SARS-CoV-2 target nucleic acids are NOT DETECTED.  The SARS-CoV-2 RNA is generally detectable in upper respiratory specimens during the acute phase of infection. The lowest concentration of SARS-CoV-2 viral copies this assay can detect is 138 copies/mL. A negative result does not preclude SARS-Cov-2 infection and should not be used as the sole basis for treatment or other patient management decisions. A negative result may occur with  improper specimen collection/handling, submission of specimen other than nasopharyngeal swab, presence of viral mutation(s) within the areas targeted by this assay, and inadequate number of viral copies(<138 copies/mL). A negative result must be combined with clinical observations, patient history, and epidemiological information. The expected result is Negative.  Fact Sheet for Patients:  BloggerCourse.com  Fact Sheet for Healthcare Providers:  SeriousBroker.it  This test is no t yet approved or cleared by the [place] FDA and  has been authorized  for detection and/or diagnosis of SARS-CoV-2 by FDA under an Emergency Use Authorization (EUA). This EUA will remain  in effect (meaning this test can be used) for the duration of the COVID-19 declaration under Section 564(b)(1) of the Act, 21 U.S.C.section 360bbb-3(b)(1), unless the authorization is terminated  or revoked sooner.       Influenza A by PCR NEGATIVE NEGATIVE Final   Influenza B by PCR NEGATIVE NEGATIVE Final    Comment: (NOTE) The Xpert Xpress SARS-CoV-2/FLU/RSV plus assay is intended as an aid in the diagnosis of influenza from Nasopharyngeal swab specimens and should not be used as a sole basis for treatment. Nasal washings and aspirates are unacceptable for Xpert Xpress SARS-CoV-2/FLU/RSV testing.  Fact Sheet for Patients: BloggerCourse.com  Fact Sheet for Healthcare Providers: SeriousBroker.it  This test is not yet approved or cleared by the [place] FDA and has been authorized for detection and/or diagnosis of SARS-CoV-2 by FDA under an Emergency Use Authorization (EUA). This EUA will remain in effect (meaning this test can be used) for the duration of the COVID-19 declaration under Section  564(b)(1) of the Act, 21 U.S.C. section 360bbb-3(b)(1), unless the authorization is terminated or revoked.  Performed at [facility], [address] [place], [place] [address]   Culture, blood (routine x 2)     Status: None   Collection Time: 09/14/20  9:23 AM   Specimen: BLOOD RIGHT HAND  Result Value Ref Range Status   Specimen Description   Final    BLOOD RIGHT HAND Performed at [facility], [address], [place], [place] [address]    Special Requests   Final    BOTTLES DRAWN AEROBIC AND ANAEROBIC Blood Culture adequate volume Performed at [facility], [address], [place], [place] [address]    Culture   Final    NO GROWTH 5 DAYS Performed  at [facility], [address], [place], [place] [address]    Report Status 09/19/2020 FINAL  Final  Culture, blood (routine x 2)     Status: None   Collection Time: 09/14/20  9:24 AM   Specimen: BLOOD LEFT HAND  Result Value Ref Range Status   Specimen Description   Final    BLOOD LEFT HAND Performed at [facility], [address], [place], [place] [address]    Special Requests   Final    BOTTLES DRAWN AEROBIC ONLY Blood Culture adequate volume Performed at [facility], [address], [place], [place] [address]    Culture   Final    NO GROWTH 5 DAYS Performed at [facility], [address] [place], [place] [address]    Report Status 09/19/2020 FINAL  Final  Aerobic/Anaerobic Culture (surgical/deep wound)     Status: None   Collection Time: 09/14/20 12:37 PM   Specimen: Abscess  Result Value Ref Range Status   Specimen Description   Final    ABSCESS  LEFT RP Performed at [facility], [address], [place], [place] [address]    Special Requests   Final    Normal Performed at [facility], [address] [place], [place] [address]    Gram Stain   Final    MODERATE WBC PRESENT, PREDOMINANTLY PMN ABUNDANT GRAM POSITIVE COCCI ABUNDANT GRAM NEGATIVE RODS    Culture   Final    ABUNDANT STAPHYLOCOCCUS AUREUS MODERATE BACTEROIDES ORALIS FEW PREVOTELLA BIVIA BETA LACTAMASE POSITIVE Performed at [facility], [address], [place], [place] [address]    Report Status 09/19/2020 FINAL  Final   Organism ID, Bacteria STAPHYLOCOCCUS AUREUS  Final      Susceptibility   Staphylococcus aureus - MIC*    CIPROFLOXACIN <=0.5 SENSITIVE Sensitive     ERYTHROMYCIN <=0.25 SENSITIVE Sensitive     GENTAMICIN <=0.5 SENSITIVE Sensitive     OXACILLIN <=0.25 SENSITIVE Sensitive     TETRACYCLINE <=1 SENSITIVE Sensitive     VANCOMYCIN <=0.5 SENSITIVE Sensitive     TRIMETH/SULFA <=10 SENSITIVE  Sensitive     CLINDAMYCIN <=0.25 SENSITIVE Sensitive     RIFAMPIN <=0.5 SENSITIVE Sensitive     Inducible Clindamycin NEGATIVE Sensitive     * ABUNDANT STAPHYLOCOCCUS AUREUS  MRSA PCR Screening     Status: None   Collection Time: 09/15/20  3:22 AM   Specimen: Nasal Mucosa; Nasopharyngeal  Result Value Ref Range Status   MRSA by PCR NEGATIVE NEGATIVE Final    Comment:        The GeneXpert MRSA Assay (FDA approved for NASAL specimens only),  is one component of a comprehensive MRSA colonization surveillance program. It is not intended to diagnose MRSA infection nor to guide or monitor treatment for MRSA infections. Performed at [facility], [address] [place], [place] [address]     No results for input(s): LIPASE, AMYLASE in the last 168 hours. No results for input(s): AMMONIA in the last 168 hours.  Cardiac Enzymes: No results for input(s): CKTOTAL, CKMB, CKMBINDEX, TROPONINI in the last 168 hours. BNP (last 3 results) No results for input(s): BNP in the last 8760 hours.  ProBNP (last 3 results) No results for input(s): PROBNP in the last 8760 hours.  Studies:  CT ABDOMEN PELVIS W CONTRAST  Result Date: 09/21/2020 CLINICAL DATA:  Abdominal abscess/infection suspected. Status post orthopedic surgery on 09/15/2020. LEFT thigh incision and drainage on 09/20/2020. EXAM: CT ABDOMEN AND PELVIS WITH CONTRAST CT LEFT FEMUR WITH CONTRAST TECHNIQUE: Multidetector CT imaging of the abdomen and pelvis was performed using the standard protocol following bolus administration of intravenous contrast. CONTRAST:  OMNIPAQUE IOHEXOL 300 MG/ML  SOLN COMPARISON:  CT abdomen pelvis dated 09/18/2020. CT LEFT femur dated 09/18/2020. FINDINGS: CT abdomen AND PELVIS FINDINGS: Lower chest: Mild bibasilar atelectasis. Hepatobiliary: Cirrhotic-appearing liver. No focal mass or lesion is seen within the liver. Gallbladder is unremarkable. No bile duct dilatation. Pancreas:  Unremarkable. No pancreatic ductal dilatation or surrounding inflammatory changes. Spleen: Perhaps mild splenomegaly.  Otherwise unremarkable. Adrenals/Urinary Tract: Adrenal glands appear normal. Kidneys are unremarkable without mass, stone or hydronephrosis. No ureteral or bladder calculi are identified. Bladder is decompressed by Foley catheter. Stomach/Bowel: No dilated large or small bowel loops. Stomach is unremarkable. Vascular/Lymphatic: No acute appearing vascular abnormality. No enlarged lymph nodes are seen. Reproductive: Prostate gland is unremarkable. Other: Moderate to large ascites, similar to the previous CT of 09/18/2020. No new fluid collection or abscess. No free intraperitoneal air. Musculoskeletal: No acute appearing osseous abnormality. Stable position of the LEFT inguinal approach surgical drain with pigtail terminating at the site of a previously demonstrated fluid collection in the LEFT lower psoas muscle. This collection has resolved in the interval. The additional complex fluid collection within the iliacus muscle is also significantly improved and nearly resolved, perhaps a small residual component overlying the LEFT SI joint (series 5, image 81). Persistent anasarca without significant change. CT LEFT FEMUR FINDINGS: Interval surgical evacuation of the abscess collection in the medial LEFT thigh. No persistent fluid collection is seen, with expected postsurgical changes of the overlying soft tissues and with associated packing in place at the overlying skin defect. Grossly stable abscess collection anterior to the upper LEFT femur, measuring 3.3 cm (series 11, image 150). The larger fluid collection seen at this level on the previous study is no longer identified status post the interval incision and drainage. The drainage catheters have been removed from the upper thigh. No new fluid collection or abscess-like collection is identified within the soft tissues about the LEFT femur. Soft  tissues about the LEFT knee are unremarkable. No hemorrhage or contrast extravasation is seen within the soft tissues about the LEFT femur. Osseous femur itself appears intact and normal in mineralization. No cortical defect, lucency or destructive change to suggest associated osteomyelitis. IMPRESSION: 1. Stable position of the LEFT inguinal approach surgical drain with pigtail terminating at the site of a previously demonstrated fluid collection in the LEFT lower psoas muscle. This psoas muscle collection has resolved in the interval. 2. The additional complex fluid collection within the iliacus muscle is also significantly  improved and nearly resolved, perhaps a small residual component overlying the LEFT SI joint. 3. Moderate to large ascites, similar to the previous CT of 09/18/2020. No new fluid collection or abscess. 4. Cirrhotic-appearing liver. 5. Largest abscess collections within the medial aspect of the LEFT thigh are no longer seen status post interval incision and drainage. Expected postsurgical changes of the overlying soft tissues. No evidence of surgical complicating feature. 6. Stable smaller abscess collection anterior to the LEFT femur, measuring 3.3 cm greatest dimension. 7. Osseous femur appears intact and normal in mineralization. No evidence of associated osteomyelitis. Electronically Signed   By: Bary Richard M.D.   On: 09/21/2020 17:42   CT FEMUR LEFT W CONTRAST  Result Date: 09/21/2020 CLINICAL DATA:  Abdominal abscess/infection suspected. Status post orthopedic surgery on 09/15/2020. LEFT thigh incision and drainage on 09/20/2020. EXAM: CT ABDOMEN AND PELVIS WITH CONTRAST CT LEFT FEMUR WITH CONTRAST TECHNIQUE: Multidetector CT imaging of the abdomen and pelvis was performed using the standard protocol following bolus administration of intravenous contrast. CONTRAST:  OMNIPAQUE IOHEXOL 300 MG/ML  SOLN COMPARISON:  CT abdomen pelvis dated 09/18/2020. CT LEFT femur dated  09/18/2020. FINDINGS: CT abdomen AND PELVIS FINDINGS: Lower chest: Mild bibasilar atelectasis. Hepatobiliary: Cirrhotic-appearing liver. No focal mass or lesion is seen within the liver. Gallbladder is unremarkable. No bile duct dilatation. Pancreas: Unremarkable. No pancreatic ductal dilatation or surrounding inflammatory changes. Spleen: Perhaps mild splenomegaly.  Otherwise unremarkable. Adrenals/Urinary Tract: Adrenal glands appear normal. Kidneys are unremarkable without mass, stone or hydronephrosis. No ureteral or bladder calculi are identified. Bladder is decompressed by Foley catheter. Stomach/Bowel: No dilated large or small bowel loops. Stomach is unremarkable. Vascular/Lymphatic: No acute appearing vascular abnormality. No enlarged lymph nodes are seen. Reproductive: Prostate gland is unremarkable. Other: Moderate to large ascites, similar to the previous CT of 09/18/2020. No new fluid collection or abscess. No free intraperitoneal air. Musculoskeletal: No acute appearing osseous abnormality. Stable position of the LEFT inguinal approach surgical drain with pigtail terminating at the site of a previously demonstrated fluid collection in the LEFT lower psoas muscle. This collection has resolved in the interval. The additional complex fluid collection within the iliacus muscle is also significantly improved and nearly resolved, perhaps a small residual component overlying the LEFT SI joint (series 5, image 81). Persistent anasarca without significant change. CT LEFT FEMUR FINDINGS: Interval surgical evacuation of the abscess collection in the medial LEFT thigh. No persistent fluid collection is seen, with expected postsurgical changes of the overlying soft tissues and with associated packing in place at the overlying skin defect. Grossly stable abscess collection anterior to the upper LEFT femur, measuring 3.3 cm (series 11, image 150). The larger fluid collection seen at this level on the previous study  is no longer identified status post the interval incision and drainage. The drainage catheters have been removed from the upper thigh. No new fluid collection or abscess-like collection is identified within the soft tissues about the LEFT femur. Soft tissues about the LEFT knee are unremarkable. No hemorrhage or contrast extravasation is seen within the soft tissues about the LEFT femur. Osseous femur itself appears intact and normal in mineralization. No cortical defect, lucency or destructive change to suggest associated osteomyelitis. IMPRESSION: 1. Stable position of the LEFT inguinal approach surgical drain with pigtail terminating at the site of a previously demonstrated fluid collection in the LEFT lower psoas muscle. This psoas muscle collection has resolved in the interval. 2. The additional complex fluid collection within the iliacus muscle  is also significantly improved and nearly resolved, perhaps a small residual component overlying the LEFT SI joint. 3. Moderate to large ascites, similar to the previous CT of 09/18/2020. No new fluid collection or abscess. 4. Cirrhotic-appearing liver. 5. Largest abscess collections within the medial aspect of the LEFT thigh are no longer seen status post interval incision and drainage. Expected postsurgical changes of the overlying soft tissues. No evidence of surgical complicating feature. 6. Stable smaller abscess collection anterior to the LEFT femur, measuring 3.3 cm greatest dimension. 7. Osseous femur appears intact and normal in mineralization. No evidence of associated osteomyelitis. Electronically Signed   By: Bary RichardStan  Maynard M.D.   On: 09/21/2020 17:42       Ida Uppal S Ashly Goethe   [facility] If 7PM-7AM, please contact night-coverage at [email or web address], Office  [phone]   09/22/2020, 2:50 PM  LOS: 9 days

## 2020-09-22 NOTE — Anesthesia Preprocedure Evaluation (Addendum)
Anesthesia Evaluation  Patient identified by MRN, date of birth, ID band Patient awake    Reviewed: Allergy & Precautions, NPO status , Patient's Chart, lab work & pertinent test results  Airway Mallampati: I  TM Distance: >3 FB Neck ROM: Full    Dental  (+) Teeth Intact, Dental Advisory Given   Pulmonary Current Smoker,    breath sounds clear to auscultation       Cardiovascular hypertension, Pt. on medications and Pt. on home beta blockers  Rhythm:Regular Rate:Normal  Echo 09/19/20: 1. No obvious vegetations.  2. Left ventricular ejection fraction, by estimation, is 60 to 65%. The  left ventricle has normal function.  3. Right ventricular systolic function is normal. The right ventricular  size is normal.  4. No left atrial/left atrial appendage thrombus was detected.  5. The mitral valve is normal in structure. Trivial mitral valve  regurgitation.  6. The aortic valve is normal in structure. Aortic valve regurgitation is  not visualized.  7. Agitated saline contrast bubble study was negative, with no evidence  of any interatrial shunt.    Neuro/Psych PSYCHIATRIC DISORDERS Anxiety Depression negative neurological ROS     GI/Hepatic GERD  Medicated and Controlled,(+) Cirrhosis   ascites  substance abuse (EtOH abuse)  alcohol use and marijuana use,   Endo/Other  negative endocrine ROS  Renal/GU negative Renal ROS  negative genitourinary   Musculoskeletal  (+) Arthritis , Osteoarthritis,  L thigh abscess   Abdominal Normal abdominal exam  (+)   Peds  Hematology  (+) Blood dyscrasia, anemia , 7.8/23.5 (after one unit prbc this AM), plt 104   Anesthesia Other Findings History of left hip septic arthritis complicated by development of left retroperitoneal and left thigh fluid collections concerning for abscesses s/p drain placement x3 (left retroperitoneal, medial left thigh, lateral left thigh) in IR  09/14/2020.  Reproductive/Obstetrics negative OB ROS                          Anesthesia Physical Anesthesia Plan  ASA: III  Anesthesia Plan: General   Post-op Pain Management:    Induction: Intravenous  PONV Risk Score and Plan: 1 and Ondansetron, Dexamethasone, Midazolam and Treatment may vary due to age or medical condition  Airway Management Planned: LMA  Additional Equipment: None  Intra-op Plan:   Post-operative Plan: Extubation in OR  Informed Consent: I have reviewed the patients History and Physical, chart, labs and discussed the procedure including the risks, benefits and alternatives for the proposed anesthesia with the patient or authorized representative who has indicated his/her understanding and acceptance.       Plan Discussed with: CRNA  Anesthesia Plan Comments:        Anesthesia Quick Evaluation

## 2020-09-22 NOTE — Progress Notes (Signed)
Subjective: Patient resting in hospital bed. Reports pain in left thigh, worse medially. Feel like left foot swelling has decreased. Dressings were just changed. No other complaints.   Objective:  PE: VITALS:   Vitals:   09/22/20 0300 09/22/20 0400 09/22/20 0500 09/22/20 0600  BP: 122/80 121/82 113/80 116/76  Pulse: (!) 102 (!) 105 (!) 108 (!) 108  Resp: 18 19 18 19   Temp:  97.9 F (36.6 C)    TempSrc:  Oral    SpO2: 97% 96% 96% 96%  Weight:      Height:       General: alert, oriented, resting in hospital bed Resp: no increased respiratory effort Skin: skin sloughing with pustules at left groin MSK: Decreased swelling at left foot. Left calf and thigh continue to be TTP. Sensation intact to all aspects of left foot. + DP pulse. Dorsiflexion/Plantar flexion intact. Surgical dressings changed this morning, no drainage noted on bandages. Wound vac in place with approx 100 cc's bloody drainage in canister, good seal.   LABS  Results for orders placed or performed during the hospital encounter of 09/13/20 (from the past 24 hour(s))  Hemoglobin and hematocrit, blood     Status: Abnormal   Collection Time: 09/21/20 11:17 AM  Result Value Ref Range   Hemoglobin 7.8 (L) 13.0 - 17.0 g/dL   HCT 11/21/20 (L) 55.7 - 32.2 %    CT ABDOMEN PELVIS W CONTRAST  Result Date: 09/21/2020 CLINICAL DATA:  Abdominal abscess/infection suspected. Status post orthopedic surgery on 09/15/2020. LEFT thigh incision and drainage on 09/20/2020. EXAM: CT ABDOMEN AND PELVIS WITH CONTRAST CT LEFT FEMUR WITH CONTRAST TECHNIQUE: Multidetector CT imaging of the abdomen and pelvis was performed using the standard protocol following bolus administration of intravenous contrast. CONTRAST:  11/20/2020 OMNIPAQUE IOHEXOL 300 MG/ML  SOLN COMPARISON:  CT abdomen pelvis dated 09/18/2020. CT LEFT femur dated 09/18/2020. FINDINGS: CT abdomen AND PELVIS FINDINGS: Lower chest: Mild bibasilar atelectasis. Hepatobiliary:  Cirrhotic-appearing liver. No focal mass or lesion is seen within the liver. Gallbladder is unremarkable. No bile duct dilatation. Pancreas: Unremarkable. No pancreatic ductal dilatation or surrounding inflammatory changes. Spleen: Perhaps mild splenomegaly.  Otherwise unremarkable. Adrenals/Urinary Tract: Adrenal glands appear normal. Kidneys are unremarkable without mass, stone or hydronephrosis. No ureteral or bladder calculi are identified. Bladder is decompressed by Foley catheter. Stomach/Bowel: No dilated large or small bowel loops. Stomach is unremarkable. Vascular/Lymphatic: No acute appearing vascular abnormality. No enlarged lymph nodes are seen. Reproductive: Prostate gland is unremarkable. Other: Moderate to large ascites, similar to the previous CT of 09/18/2020. No new fluid collection or abscess. No free intraperitoneal air. Musculoskeletal: No acute appearing osseous abnormality. Stable position of the LEFT inguinal approach surgical drain with pigtail terminating at the site of a previously demonstrated fluid collection in the LEFT lower psoas muscle. This collection has resolved in the interval. The additional complex fluid collection within the iliacus muscle is also significantly improved and nearly resolved, perhaps a small residual component overlying the LEFT SI joint (series 5, image 81). Persistent anasarca without significant change. CT LEFT FEMUR FINDINGS: Interval surgical evacuation of the abscess collection in the medial LEFT thigh. No persistent fluid collection is seen, with expected postsurgical changes of the overlying soft tissues and with associated packing in place at the overlying skin defect. Grossly stable abscess collection anterior to the upper LEFT femur, measuring 3.3 cm (series 11, image 150). The larger fluid collection seen at this level on the previous study is no longer  identified status post the interval incision and drainage. The drainage catheters have been  removed from the upper thigh. No new fluid collection or abscess-like collection is identified within the soft tissues about the LEFT femur. Soft tissues about the LEFT knee are unremarkable. No hemorrhage or contrast extravasation is seen within the soft tissues about the LEFT femur. Osseous femur itself appears intact and normal in mineralization. No cortical defect, lucency or destructive change to suggest associated osteomyelitis. IMPRESSION: 1. Stable position of the LEFT inguinal approach surgical drain with pigtail terminating at the site of a previously demonstrated fluid collection in the LEFT lower psoas muscle. This psoas muscle collection has resolved in the interval. 2. The additional complex fluid collection within the iliacus muscle is also significantly improved and nearly resolved, perhaps a small residual component overlying the LEFT SI joint. 3. Moderate to large ascites, similar to the previous CT of 09/18/2020. No new fluid collection or abscess. 4. Cirrhotic-appearing liver. 5. Largest abscess collections within the medial aspect of the LEFT thigh are no longer seen status post interval incision and drainage. Expected postsurgical changes of the overlying soft tissues. No evidence of surgical complicating feature. 6. Stable smaller abscess collection anterior to the LEFT femur, measuring 3.3 cm greatest dimension. 7. Osseous femur appears intact and normal in mineralization. No evidence of associated osteomyelitis. Electronically Signed   By: Bary Richard M.D.   On: 09/21/2020 17:42   CT FEMUR LEFT W CONTRAST  Result Date: 09/21/2020 CLINICAL DATA:  Abdominal abscess/infection suspected. Status post orthopedic surgery on 09/15/2020. LEFT thigh incision and drainage on 09/20/2020. EXAM: CT ABDOMEN AND PELVIS WITH CONTRAST CT LEFT FEMUR WITH CONTRAST TECHNIQUE: Multidetector CT imaging of the abdomen and pelvis was performed using the standard protocol following bolus administration of  intravenous contrast. CONTRAST:  OMNIPAQUE IOHEXOL 300 MG/ML  SOLN COMPARISON:  CT abdomen pelvis dated 09/18/2020. CT LEFT femur dated 09/18/2020. FINDINGS: CT abdomen AND PELVIS FINDINGS: Lower chest: Mild bibasilar atelectasis. Hepatobiliary: Cirrhotic-appearing liver. No focal mass or lesion is seen within the liver. Gallbladder is unremarkable. No bile duct dilatation. Pancreas: Unremarkable. No pancreatic ductal dilatation or surrounding inflammatory changes. Spleen: Perhaps mild splenomegaly.  Otherwise unremarkable. Adrenals/Urinary Tract: Adrenal glands appear normal. Kidneys are unremarkable without mass, stone or hydronephrosis. No ureteral or bladder calculi are identified. Bladder is decompressed by Foley catheter. Stomach/Bowel: No dilated large or small bowel loops. Stomach is unremarkable. Vascular/Lymphatic: No acute appearing vascular abnormality. No enlarged lymph nodes are seen. Reproductive: Prostate gland is unremarkable. Other: Moderate to large ascites, similar to the previous CT of 09/18/2020. No new fluid collection or abscess. No free intraperitoneal air. Musculoskeletal: No acute appearing osseous abnormality. Stable position of the LEFT inguinal approach surgical drain with pigtail terminating at the site of a previously demonstrated fluid collection in the LEFT lower psoas muscle. This collection has resolved in the interval. The additional complex fluid collection within the iliacus muscle is also significantly improved and nearly resolved, perhaps a small residual component overlying the LEFT SI joint (series 5, image 81). Persistent anasarca without significant change. CT LEFT FEMUR FINDINGS: Interval surgical evacuation of the abscess collection in the medial LEFT thigh. No persistent fluid collection is seen, with expected postsurgical changes of the overlying soft tissues and with associated packing in place at the overlying skin defect. Grossly stable abscess collection  anterior to the upper LEFT femur, measuring 3.3 cm (series 11, image 150). The larger fluid collection seen at this level on the previous  study is no longer identified status post the interval incision and drainage. The drainage catheters have been removed from the upper thigh. No new fluid collection or abscess-like collection is identified within the soft tissues about the LEFT femur. Soft tissues about the LEFT knee are unremarkable. No hemorrhage or contrast extravasation is seen within the soft tissues about the LEFT femur. Osseous femur itself appears intact and normal in mineralization. No cortical defect, lucency or destructive change to suggest associated osteomyelitis. IMPRESSION: 1. Stable position of the LEFT inguinal approach surgical drain with pigtail terminating at the site of a previously demonstrated fluid collection in the LEFT lower psoas muscle. This psoas muscle collection has resolved in the interval. 2. The additional complex fluid collection within the iliacus muscle is also significantly improved and nearly resolved, perhaps a small residual component overlying the LEFT SI joint. 3. Moderate to large ascites, similar to the previous CT of 09/18/2020. No new fluid collection or abscess. 4. Cirrhotic-appearing liver. 5. Largest abscess collections within the medial aspect of the LEFT thigh are no longer seen status post interval incision and drainage. Expected postsurgical changes of the overlying soft tissues. No evidence of surgical complicating feature. 6. Stable smaller abscess collection anterior to the LEFT femur, measuring 3.3 cm greatest dimension. 7. Osseous femur appears intact and normal in mineralization. No evidence of associated osteomyelitis. Electronically Signed   By: Bary Richard M.D.   On: 09/21/2020 17:42    Assessment/Plan:  Left hip septic arthritis with iliopsoas, left gluteal, and left medial thigh abscess  - left hip total arthroplasty with cement  interposition spacer on 2/24 - left thigh irrigation and debridement with wound vac placement 3/1 - post op day 1  Weightbearing: WBAT LLE Insicional and dressing care: twice a day dressing changes to incision with gauze and abds VTE prophylaxis: will default to primary given coagulopathy from liver disease and thrombocytopenia, SCD's Pain control: oxycodone  Patient is going back to the OR with general surgery this morning for I&D of left groin abscess.   Contact information:   After hours and holidays please check [email or web address] for group call information for [facility]  Vernetta Honey 09/22/2020, 6:53 AM

## 2020-09-22 NOTE — Progress Notes (Signed)
Referring Physician(s): Merlene Laughter [facility])  Supervising Physician: Malachy Moan  Patient Status:  [facility] - In-pt  Chief Complaint: "Thigh and hip pain"  Subjective:  History of left hip septic arthritis complicated by development of left retroperitoneal and left thigh fluid collections concerning for abscesses s/p drain placement x3 (left retroperitoneal, medial left thigh, lateral left thigh) in IR 09/14/2020. Patient awake and alert laying in bed. Complains of left thigh and hip pain, rated 5/10 at this time. Girlfriend at bedside. Planned for OR today with [facility]. Medial left thigh and lateral left thigh drain sites c/d/i.   Allergies: Patient has no active allergies.  Medications: Prior to Admission medications   Medication Sig Start Date End Date Taking? Authorizing Provider  carvedilol (COREG) 3.125 MG tablet Take 3.125 mg by mouth 2 (two) times daily. 07/28/20  Yes [provider]  folic acid (FOLVITE) 1 MG tablet Take 1 tablet (1 mg total) by mouth daily. 08/30/20  Yes Pokhrel, Laxman, MD  guaiFENesin-dextromethorphan (ROBITUSSIN DM) 100-10 MG/5ML syrup Take 5 mLs by mouth every 4 (four) hours as needed for cough (chest congestion). Patient taking differently: Take 10 mLs by mouth every 4 (four) hours as needed for cough (chest congestion). 08/29/20  Yes Pokhrel, Laxman, MD  lactulose (CHRONULAC) 10 GM/15ML solution Take 15 mLs (10 g total) by mouth 2 (two) times daily. 08/29/20  Yes Pokhrel, Laxman, MD  magnesium oxide (MAG-OX) 400 MG tablet Take 1 tablet by mouth daily. 07/28/20  Yes [provider]  pantoprazole (PROTONIX) 40 MG tablet Take 1 tablet (40 mg total) by mouth 2 (two) times daily before a meal for 28 days, THEN 1 tablet (40 mg total) daily for 28 days. 08/29/20 10/24/20 Yes Pokhrel, Laxman, MD  prednisoLONE (ORAPRED) 15 MG/5ML solution Take 1.6-13.3 mLs by mouth See admin instructions. Take 13.20ml by mouth daily for 28 days, 42ml for 4 days, 6.51ml  daily for 4 days, 3.3 daily for 4 days, then 1.2ml daily for 3 days. 08/29/20  Yes [provider]  sertraline (ZOLOFT) 25 MG tablet Take 25 mg by mouth at bedtime. 08/20/20  Yes [provider]  thiamine 100 MG tablet Take 1 tablet (100 mg total) by mouth daily. 08/30/20  Yes Pokhrel, Laxman, MD  buPROPion (WELLBUTRIN XL) 300 MG 24 hr tablet Take 1 tablet (300 mg total) by mouth daily. Patient not taking: No sig reported 10/16/13   Rachael Fee, MD  carbamazepine (TEGRETOL XR) 200 MG 12 hr tablet Take 1 tablet (200 mg total) by mouth at bedtime. Patient not taking: No sig reported 10/16/13   Rachael Fee, MD  hydrOXYzine (ATARAX/VISTARIL) 25 MG tablet Take 1 tablet (25 mg total) by mouth every 6 (six) hours as needed for anxiety (or CIWA score </= 10). Patient not taking: No sig reported 10/16/13   Rachael Fee, MD  lisinopril-hydrochlorothiazide (ZESTORETIC) 20-25 MG tablet Take 1 tablet by mouth daily. Patient not taking: No sig reported 08/31/20   Pokhrel, Rebekah [place], MD  oxyCODONE (ROXICODONE) 5 MG immediate release tablet Take 1 tablet (5 mg total) by mouth every 8 (eight) hours as needed. Patient not taking: No sig reported 08/29/20 08/29/21  Pokhrel, Rebekah [place], MD  prednisoLONE 5 MG TABS tablet Take 8 tablets (40 mg total) by mouth daily for 28 days, THEN 6 tablets (30 mg total) daily for 4 days, THEN 4 tablets (20 mg total) daily for 4 days, THEN 2 tablets (10 mg total) daily for 4 days, THEN 1 tablet (  5 mg total) every 3 (three) days for 1 day. Patient not taking: Reported on 09/13/2020 08/29/20 10/09/20  Joycelyn Das, MD  traMADol (ULTRAM) 50 MG tablet Take 1 tablet (50 mg total) by mouth every 6 (six) hours as needed. Patient not taking: Reported on 09/13/2020 08/29/20   Joycelyn Das, MD     Vital Signs: BP 129/80   Pulse (!) 123   Temp 99.5 F (37.5 C) (Axillary)   Resp (!) 21   Ht 5\' 10"  (1.778 m)   Wt 207 lb 0.2 oz (93.9 kg)   SpO2 93%   BMI 29.70 kg/m   Physical  Exam Vitals and nursing note reviewed.  Constitutional:      General: He is not in acute distress. Pulmonary:     Effort: Pulmonary effort is normal. No respiratory distress.  Musculoskeletal:     Comments: (+) left hip incision with staples present. (+) left anterior thigh wound vac. Left scrotum/groin with erythema and edema, associated wound in left groin. Left medial thigh drain (#2) with approximately 10-15 cc of serousanguinous fluid in gravity bag. Left lateral thigh drain (#3) with approximately 10-15 cc of serousanguinous fluid in gravity bag.  Skin:    General: Skin is warm and dry.  Neurological:     Mental Status: He is alert and oriented to person, place, and time.     Imaging: CT ABDOMEN PELVIS W CONTRAST  Result Date: 09/21/2020 CLINICAL DATA:  Abdominal abscess/infection suspected. Status post orthopedic surgery on 09/15/2020. LEFT thigh incision and drainage on 09/20/2020. EXAM: CT ABDOMEN AND PELVIS WITH CONTRAST CT LEFT FEMUR WITH CONTRAST TECHNIQUE: Multidetector CT imaging of the abdomen and pelvis was performed using the standard protocol following bolus administration of intravenous contrast. CONTRAST:  11/20/2020 OMNIPAQUE IOHEXOL 300 MG/ML  SOLN COMPARISON:  CT abdomen pelvis dated 09/18/2020. CT LEFT femur dated 09/18/2020. FINDINGS: CT abdomen AND PELVIS FINDINGS: Lower chest: Mild bibasilar atelectasis. Hepatobiliary: Cirrhotic-appearing liver. No focal mass or lesion is seen within the liver. Gallbladder is unremarkable. No bile duct dilatation. Pancreas: Unremarkable. No pancreatic ductal dilatation or surrounding inflammatory changes. Spleen: Perhaps mild splenomegaly.  Otherwise unremarkable. Adrenals/Urinary Tract: Adrenal glands appear normal. Kidneys are unremarkable without mass, stone or hydronephrosis. No ureteral or bladder calculi are identified. Bladder is decompressed by Foley catheter. Stomach/Bowel: No dilated large or small bowel loops. Stomach is  unremarkable. Vascular/Lymphatic: No acute appearing vascular abnormality. No enlarged lymph nodes are seen. Reproductive: Prostate gland is unremarkable. Other: Moderate to large ascites, similar to the previous CT of 09/18/2020. No new fluid collection or abscess. No free intraperitoneal air. Musculoskeletal: No acute appearing osseous abnormality. Stable position of the LEFT inguinal approach surgical drain with pigtail terminating at the site of a previously demonstrated fluid collection in the LEFT lower psoas muscle. This collection has resolved in the interval. The additional complex fluid collection within the iliacus muscle is also significantly improved and nearly resolved, perhaps a small residual component overlying the LEFT SI joint (series 5, image 81). Persistent anasarca without significant change. CT LEFT FEMUR FINDINGS: Interval surgical evacuation of the abscess collection in the medial LEFT thigh. No persistent fluid collection is seen, with expected postsurgical changes of the overlying soft tissues and with associated packing in place at the overlying skin defect. Grossly stable abscess collection anterior to the upper LEFT femur, measuring 3.3 cm (series 11, image 150). The larger fluid collection seen at this level on the previous study is no longer identified status post the interval incision  and drainage. The drainage catheters have been removed from the upper thigh. No new fluid collection or abscess-like collection is identified within the soft tissues about the LEFT femur. Soft tissues about the LEFT knee are unremarkable. No hemorrhage or contrast extravasation is seen within the soft tissues about the LEFT femur. Osseous femur itself appears intact and normal in mineralization. No cortical defect, lucency or destructive change to suggest associated osteomyelitis. IMPRESSION: 1. Stable position of the LEFT inguinal approach surgical drain with pigtail terminating at the site of a  previously demonstrated fluid collection in the LEFT lower psoas muscle. This psoas muscle collection has resolved in the interval. 2. The additional complex fluid collection within the iliacus muscle is also significantly improved and nearly resolved, perhaps a small residual component overlying the LEFT SI joint. 3. Moderate to large ascites, similar to the previous CT of 09/18/2020. No new fluid collection or abscess. 4. Cirrhotic-appearing liver. 5. Largest abscess collections within the medial aspect of the LEFT thigh are no longer seen status post interval incision and drainage. Expected postsurgical changes of the overlying soft tissues. No evidence of surgical complicating feature. 6. Stable smaller abscess collection anterior to the LEFT femur, measuring 3.3 cm greatest dimension. 7. Osseous femur appears intact and normal in mineralization. No evidence of associated osteomyelitis. Electronically Signed   By: Bary Richard M.D.   On: 09/21/2020 17:42   CT ABDOMEN PELVIS W CONTRAST  Result Date: 09/18/2020 CLINICAL DATA:  f ollow up drain placement in Abd/pelvis and left femur EXAM: CT ABDOMEN AND PELVIS WITH CONTRAST TECHNIQUE: Multidetector CT imaging of the abdomen and pelvis was performed using the standard protocol following bolus administration of intravenous contrast. CONTRAST:  OMNIPAQUE IOHEXOL 300 MG/ML  SOLN COMPARISON:  CT abdomen pelvis 09/13/2020 FINDINGS: Lower chest: Bilateral lower lobe subsegmental atelectasis. 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 Slightly heterogeneous appearance of the hepatic parenchyma. No definite focal hepatic lesion; however, limited evaluation on this single-phase venous contrast study. No CT findings of calcified gallstones. No gallbladder wall thickening. No biliary ductal dilatation. Pancreas: No focal lesion. Normal pancreatic contour. No surrounding inflammatory changes. No main pancreatic ductal dilatation.  Spleen: Normal in size without focal abnormality. Adrenals/Urinary Tract: No adrenal nodule bilaterally. Bilateral kidneys enhance symmetrically. No hydronephrosis. No hydroureter. Foci of gas within the urinary bladder lumen likely related to recent instrumentation. Otherwise the urinary bladder is unremarkable. Stomach/Bowel: Stomach is within normal limits. No evidence of bowel wall thickening or dilatation. The appendix not definitely identified. Vascular/Lymphatic: The portal, splenic, superior mesenteric veins are patent. No abdominal aorta or iliac aneurysm. Multiple prominent but nonenlarged asymmetric left inguinal lymph nodes. Similar finding of the left external iliac lymph nodes. No abdominal, pelvic, or inguinal lymphadenopathy. Reproductive: Prostate is unremarkable. Scrotal subcutaneus soft tissue edema again noted, likely slightly worsened. Other: Interval increase in small to moderate volume simple free fluid ascites. No free intraperitoneal gas. Musculoskeletal: Interval placement of a left inguinal approach surgical drain with pigtail terminating within a fluid collection associated with the left psoas muscle. The fluid collections are noted to be septated with peripheral enhancement. There has been interval decrease in size of the iliopsoas fluid collections with the largest component of the psoas muscle measuring up to 4.3 x 1.3 cm (from 6.5 x 5.6 cm and inferiorly the largest iliacus portion that appears to be septated measuring approximately 5.8 x 4.4 cm (from 8.3 x 6.7 cm). Redemonstration of proximal left thigh extensive subcutaneus soft tissue  edema as well as several foci of emphysema. Associated multiple fluid collections that are peripherally enhancing. Status post interval surgical drain placement that is partially visualized. Please see separately dictated CT left femur 09/13/2020 for further details. Persistent subcutaneus soft tissue edema that is increased compared to prior.  Interval placement of skin staples overlying the left hip. Status post interval partially visualized left total hip arthroplasty. IMPRESSION: 1. Interval decrease in size of multiple multiseptated iliopsoas abscesses status post interval surgical drain placement with pigtail terminating within a fluid collection associated with the left psoas muscle. 2. Redemonstration of proximal left thigh extensive subcutaneus soft tissue edema as well as several foci of emphysema. Associated left lower extremity multiple abscesses status post partially visualized interval drain catheter. Status post interval partially visualized left total hip arthroplasty. Please see separately dictated CT left femur 09/18/2020 for further details. 3. Foci of gas within the urinary bladder lumen. Correlate with recent instrumentation. 4. Interval increase in small to moderate volume simple free fluid ascites. 5. Interval increase in diffuse subcutaneus soft tissue edema. 6. Cirrhotic morphology of the liver. Markedly limited evaluation for focal hepatic lesion on this single phase venous contrast study. Recommend non-emergent MRI hepatic liver protocol for further evaluation. 7. Please see separately dictated CT left femur 09/18/20 further details. Electronically Signed   By: Tish Frederickson M.D.   On: 09/18/2020 17:14   CT FEMUR LEFT W CONTRAST  Result Date: 09/21/2020 CLINICAL DATA:  Abdominal abscess/infection suspected. Status post orthopedic surgery on 09/15/2020. LEFT thigh incision and drainage on 09/20/2020. EXAM: CT ABDOMEN AND PELVIS WITH CONTRAST CT LEFT FEMUR WITH CONTRAST TECHNIQUE: Multidetector CT imaging of the abdomen and pelvis was performed using the standard protocol following bolus administration of intravenous contrast. CONTRAST:  OMNIPAQUE IOHEXOL 300 MG/ML  SOLN COMPARISON:  CT abdomen pelvis dated 09/18/2020. CT LEFT femur dated 09/18/2020. FINDINGS: CT abdomen AND PELVIS FINDINGS: Lower chest: Mild bibasilar  atelectasis. Hepatobiliary: Cirrhotic-appearing liver. No focal mass or lesion is seen within the liver. Gallbladder is unremarkable. No bile duct dilatation. Pancreas: Unremarkable. No pancreatic ductal dilatation or surrounding inflammatory changes. Spleen: Perhaps mild splenomegaly.  Otherwise unremarkable. Adrenals/Urinary Tract: Adrenal glands appear normal. Kidneys are unremarkable without mass, stone or hydronephrosis. No ureteral or bladder calculi are identified. Bladder is decompressed by Foley catheter. Stomach/Bowel: No dilated large or small bowel loops. Stomach is unremarkable. Vascular/Lymphatic: No acute appearing vascular abnormality. No enlarged lymph nodes are seen. Reproductive: Prostate gland is unremarkable. Other: Moderate to large ascites, similar to the previous CT of 09/18/2020. No new fluid collection or abscess. No free intraperitoneal air. Musculoskeletal: No acute appearing osseous abnormality. Stable position of the LEFT inguinal approach surgical drain with pigtail terminating at the site of a previously demonstrated fluid collection in the LEFT lower psoas muscle. This collection has resolved in the interval. The additional complex fluid collection within the iliacus muscle is also significantly improved and nearly resolved, perhaps a small residual component overlying the LEFT SI joint (series 5, image 81). Persistent anasarca without significant change. CT LEFT FEMUR FINDINGS: Interval surgical evacuation of the abscess collection in the medial LEFT thigh. No persistent fluid collection is seen, with expected postsurgical changes of the overlying soft tissues and with associated packing in place at the overlying skin defect. Grossly stable abscess collection anterior to the upper LEFT femur, measuring 3.3 cm (series 11, image 150). The larger fluid collection seen at this level on the previous study is no longer identified status post the interval  incision and drainage. The  drainage catheters have been removed from the upper thigh. No new fluid collection or abscess-like collection is identified within the soft tissues about the LEFT femur. Soft tissues about the LEFT knee are unremarkable. No hemorrhage or contrast extravasation is seen within the soft tissues about the LEFT femur. Osseous femur itself appears intact and normal in mineralization. No cortical defect, lucency or destructive change to suggest associated osteomyelitis. IMPRESSION: 1. Stable position of the LEFT inguinal approach surgical drain with pigtail terminating at the site of a previously demonstrated fluid collection in the LEFT lower psoas muscle. This psoas muscle collection has resolved in the interval. 2. The additional complex fluid collection within the iliacus muscle is also significantly improved and nearly resolved, perhaps a small residual component overlying the LEFT SI joint. 3. Moderate to large ascites, similar to the previous CT of 09/18/2020. No new fluid collection or abscess. 4. Cirrhotic-appearing liver. 5. Largest abscess collections within the medial aspect of the LEFT thigh are no longer seen status post interval incision and drainage. Expected postsurgical changes of the overlying soft tissues. No evidence of surgical complicating feature. 6. Stable smaller abscess collection anterior to the LEFT femur, measuring 3.3 cm greatest dimension. 7. Osseous femur appears intact and normal in mineralization. No evidence of associated osteomyelitis. Electronically Signed   By: Bary Richard M.D.   On: 09/21/2020 17:42   CT FEMUR LEFT W CONTRAST  Result Date: 09/18/2020 CLINICAL DATA:  Follow up left thigh abscesses following percutaneous drain placement 4 days ago. EXAM: CT OF THE LOWER LEFT EXTREMITY WITH CONTRAST TECHNIQUE: Multidetector CT imaging of the left thigh was performed according to the standard protocol following intravenous contrast administration. CONTRAST:  OMNIPAQUE IOHEXOL  300 MG/ML  SOLN COMPARISON:  Pelvic CT 09/13/2020. Images during drainage procedure 09/14/2020. FINDINGS: Bones/Joint/Cartilage Interval left total hip arthroplasty. The hardware appears well positioned. No evidence of acute fracture or dislocation. There is no bone destruction. Stable mild degenerative changes of the left sacroiliac joint. No large hip joint effusion. Ligaments Suboptimally assessed by CT. Muscles and Tendons Percutaneous drain within the left psoas muscle is unchanged in position. There is no recurrent fluid collection in the psoas muscle, although there is a residual peripherally enhancing iliacus fluid collection measuring 2.4 cm transverse on image 52/5. Peripherally enhancing fluid tracks inferiorly along the iliopsoas tendon into the anterior aspect of the left thigh. There are 2 percutaneous drains within the proximal to mid left thigh. The more lateral drain is situated within a complex fluid collection within the vastus lateralis muscle which measures up to 5.3 x 4.8 cm at the level of the drain. This collection is complex with peripheral enhancement and multiple septations. It extends into the distal thigh, at least 21 cm in length on sagittal image 165/9. Some air within the collection is attributed to the drain. The more posteromedial drain is located within a complex fluid collection involving the proximal sartorius muscle. At the level of the drain, this measures approximately 11.0 x 4.1 cm on image 203/5. This fluid collection is also complex with peripheral enhancement. It may communicate with a separate peripherally enhancing fluid collection involving the right gracilis muscle. These medial collections are also quite extensive in length, extending beyond the knee, at least 40 cm in overall length (image 125/9). Based on the images obtained through the left thigh after catheter placement on 09/14/2020, the proximal component of the anteromedial collection has mildly enlarged. Soft  tissues There is generalized  subcutaneous edema throughout the left thigh. No unexpected foreign body. Pelvic findings are dictated separately. IMPRESSION: 1. Interval placement of left pelvic and two left thigh drains for multiple abscesses. The pelvic components of the fluid collections have mildly improved, but there are multiple persistent large peripherally enhancing complex fluid collections within the left thigh which are incompletely drained. These are quite extensive in length, extending into the distal thigh. The proximal component of the anteromedial collection has slightly enlarged. 2. No evidence of osteomyelitis or large hip joint effusion. 3. Interval left total hip arthroplasty. The hardware appears well positioned. Electronically Signed   By: Carey BullocksWilliam  Veazey M.D.   On: 09/18/2020 17:38   ECHO TEE  Result Date: 09/19/2020    TRANSESOPHOGEAL ECHO REPORT   Patient Name:   George GoldsMATTHEW V Muecke Date of Exam: 09/19/2020 Medical Rec #:  [id number]         Height:       70.0 in Accession #:    [id number]        Weight:       215.6 lb Date of Birth:  18-Jul-1986         BSA:          2.155 m Patient Age:    34 years          BP:           106/66 mmHg Patient Gender: M                 HR:           104 bpm. Exam Location:  Inpatient Procedure: Transesophageal Echo, Color Doppler and Saline Contrast Bubble Study Indications:     Bacteremia  History:         Patient has prior history of Echocardiogram examinations, most                  recent 09/14/2020. Signs/Symptoms:Shortness of Breath; Risk                  Factors:Hypertension. ETOH.  Sonographer:     Ross LudwigArthur Guy RDCS (AE) Referring Phys:  [id number] [place] Diagnosing Phys: Dietrich PatesPaula Ross MD PROCEDURE: After discussion of the risks and benefits of a TEE, an informed consent was obtained from the patient. The transesophogeal probe was passed without difficulty through the esophogus of the patient. Local oropharyngeal anesthetic was provided with Cetacaine.  Sedation performed by different physician. The patient was monitored while under deep sedation. Anesthestetic sedation was provided intravenously by Anesthesiology: 227.23mg  of Propofol. Image quality was good. The patient developed no complications during the procedure. IMPRESSIONS  1. No obvious vegetations.  2. Left ventricular ejection fraction, by estimation, is 60 to 65%. The left ventricle has normal function.  3. Right ventricular systolic function is normal. The right ventricular size is normal.  4. No left atrial/left atrial appendage thrombus was detected.  5. The mitral valve is normal in structure. Trivial mitral valve regurgitation.  6. The aortic valve is normal in structure. Aortic valve regurgitation is not visualized.  7. Agitated saline contrast bubble study was negative, with no evidence of any interatrial shunt. FINDINGS  Left Ventricle: Left ventricular ejection fraction, by estimation, is 60 to 65%. The left ventricle has normal function. The left ventricular internal cavity size was normal in size. Right Ventricle: The right ventricular size is normal. Right ventricular systolic function is normal. Left Atrium: Left atrial size was normal in size. No left atrial/left atrial appendage thrombus  was detected. Right Atrium: Right atrial size was normal in size. Pericardium: There is no evidence of pericardial effusion. Mitral Valve: The mitral valve is normal in structure. Trivial mitral valve regurgitation. Tricuspid Valve: The tricuspid valve is normal in structure. Tricuspid valve regurgitation is trivial. Aortic Valve: The aortic valve is normal in structure. Aortic valve regurgitation is not visualized. Pulmonic Valve: The pulmonic valve was normal in structure. Pulmonic valve regurgitation is not visualized. Aorta: The aortic root and ascending aorta are structurally normal, with no evidence of dilitation. IAS/Shunts: No atrial level shunt detected by color flow Doppler. Agitated saline  contrast was given intravenously to evaluate for intracardiac shunting. Agitated saline contrast bubble study was negative, with no evidence of any interatrial shunt. Dietrich Pates MD Electronically signed by Dietrich Pates MD Signature Date/Time: 09/19/2020/4:02:21 PM    Final     Labs:  CBC: Recent Labs    09/18/20 0303 09/19/20 0305 09/20/20 0244 09/21/20 0234 09/21/20 1117  WBC 15.1* 14.5* 10.7* 11.9*  --   HGB 9.8* 8.8* 7.7* 6.4* 7.8*  HCT 30.2* 26.6* 24.0* 20.3* 23.5*  PLT 86* 85* 99* 104*  --     COAGS: Recent Labs    08/22/20 1817 08/25/20 0349 09/13/20 1045 09/14/20 0525 09/15/20 2300 09/19/20 0305  INR 1.4*   < > 1.4* 1.5* 2.2* 1.5*  APTT 42*  --   --   --   --   --    < > = values in this interval not displayed.    BMP: Recent Labs    09/18/20 0303 09/19/20 0305 09/20/20 0244 09/21/20 0234  NA 129* 129* 131* 126*  K 4.4 4.4 4.3 4.8  CL 98 96* 95* 95*  CO2 GLUCOSE 91 100* 96 173*  BUN CALCIUM 7.5* 7.4* 7.4* 7.1*  CREATININE 0.44* 0.41* 0.43* 0.34*  GFRNONAA >60 >60 >60 >60    LIVER FUNCTION TESTS: Recent Labs    09/18/20 0303 09/19/20 0305 09/20/20 0244 09/21/20 0234  BILITOT 1.7* 1.7* 1.6* 1.5*  AST 61* 39 30 30  ALT ALKPHOS 270* 244* 240* 214*  PROT 5.7* 5.2* 5.2* 4.8*  ALBUMIN 2.3* 2.0* 1.9* 1.7*    Assessment and Plan:  History of left hip septic arthritis complicated by development of left retroperitoneal and left thigh fluid collections concerning for abscesses s/p drain placement x3 (left retroperitoneal, medial left thigh, lateral left thigh) in IR 09/14/2020. Left retroperitoneal drain removed in OR 09/20/2020. Left medial thigh drain (#2) stable with approximately 10-15 cc of serousanguinous fluid in gravity bag (additional 30 cc output from drain in past 24 hours). Left lateral thigh drain (#3) stable with approximately 10-15 cc of serousanguinous fluid in gravity bag (additional 10 cc output from  drain in past 24 hours). To OR today for left groin I&D with general surgery. Continue current drain management- continue Qshift flushes/monitor of output. Plan for repeat CT when output <10 cc/day (assess for possible removal). Further plans per TRH/orthopedic surgery/CCS/ID- appreciate and agree with management. IR to follow.   Electronically Signed: Elwin Mocha, PA-C 09/22/2020, 10:46 AM   I spent a total of 15 Minutes at the the patient's bedside AND on the patient's hospital floor or unit, greater than 50% of which was counseling/coordinating care for left retroperitoneal and left thigh fluid collections s/p drain placement x3.

## 2020-09-22 NOTE — Progress Notes (Signed)
[facility]   Reason for visit: Follow up on disseminated MSSA infection  Interval History: plan for left groin I and D today by Dr. Luisa Hart.  Remains afebrile.   Day 10 cefazolin Day 5 metronidazole   Physical Exam: Constitutional:  Vitals:   09/22/20 1300 09/22/20 1344  BP: 113/62 113/62  Pulse: (!) 114 (!) 117  Resp: (!) 22 18  Temp:  99.8 F (37.7 C)  SpO2: 92% 94%  sleeping, ad Respiratory: normal respiratory effort Cardiovascular: RRR Skin: no rashes  Review of Systems: Constitutional: no fever Integument/breast: no rash  Lab Results  Component Value Date   WBC 11.9 (H) 09/21/2020   HGB 7.8 (L) 09/21/2020   HCT 23.5 (L) 09/21/2020   MCV 102.0 (H) 09/21/2020   PLT 104 (L) 09/21/2020    Lab Results  Component Value Date   CREATININE 0.34 (L) 09/21/2020   BUN 10 09/21/2020   NA 126 (L) 09/21/2020   K 4.8 09/21/2020   CL 95 (L) 09/21/2020   CO2 23 09/21/2020    Lab Results  Component Value Date   ALT 11 09/21/2020   AST 30 09/21/2020   ALKPHOS 214 (H) 09/21/2020     Microbiology: Recent Results (from the past 240 hour(s))  Culture, blood (routine x 2)     Status: Abnormal   Collection Time: 09/13/20 10:45 AM   Specimen: BLOOD  Result Value Ref Range Status   Specimen Description   Final    BLOOD RIGHT ANTECUBITAL Performed at [facility], [address] [place], [place] [address]    Special Requests   Final    BOTTLES DRAWN AEROBIC AND ANAEROBIC Blood Culture adequate volume Performed at [facility], [address], [place], [place] [address]    Culture  Setup Time   Final    IN BOTH AEROBIC AND ANAEROBIC BOTTLES GRAM POSITIVE COCCI CRITICAL RESULT CALLED TO, READ BACK BY AND VERIFIED WITHErling Cruz [facility] 09/13/20 0347 JDW Performed at [facility], [address] [place], [place] [address]    Culture STAPHYLOCOCCUS AUREUS (A)  Final   Report Status 09/16/2020  FINAL  Final   Organism ID, Bacteria STAPHYLOCOCCUS AUREUS  Final      Susceptibility   Staphylococcus aureus - MIC*    CIPROFLOXACIN <=0.5 SENSITIVE Sensitive     ERYTHROMYCIN <=0.25 SENSITIVE Sensitive     GENTAMICIN <=0.5 SENSITIVE Sensitive     OXACILLIN <=0.25 SENSITIVE Sensitive     TETRACYCLINE <=1 SENSITIVE Sensitive     VANCOMYCIN <=0.5 SENSITIVE Sensitive     TRIMETH/SULFA <=10 SENSITIVE Sensitive     CLINDAMYCIN <=0.25 SENSITIVE Sensitive     RIFAMPIN <=0.5 SENSITIVE Sensitive     Inducible Clindamycin NEGATIVE Sensitive     * STAPHYLOCOCCUS AUREUS  Blood Culture ID Panel (Reflexed)     Status: Abnormal   Collection Time: 09/13/20 10:45 AM  Result Value Ref Range Status   Enterococcus faecalis NOT DETECTED NOT DETECTED Final   Enterococcus Faecium NOT DETECTED NOT DETECTED Final   Listeria monocytogenes NOT DETECTED NOT DETECTED Final   Staphylococcus species DETECTED (A) NOT DETECTED Final    Comment: CRITICAL RESULT CALLED TO, READ BACK BY AND VERIFIED WITH: E JACKSON PHARMD 09/13/20 0347 JDW    Staphylococcus aureus (BCID) DETECTED (A) NOT DETECTED Final    Comment: CRITICAL RESULT CALLED TO, READ BACK BY AND VERIFIED WITH: E JACKSON PHARMD 09/13/20 0347 JDW    Staphylococcus  epidermidis NOT DETECTED NOT DETECTED Final   Staphylococcus lugdunensis NOT DETECTED NOT DETECTED Final   Streptococcus species NOT DETECTED NOT DETECTED Final   Streptococcus agalactiae NOT DETECTED NOT DETECTED Final   Streptococcus pneumoniae NOT DETECTED NOT DETECTED Final   Streptococcus pyogenes NOT DETECTED NOT DETECTED Final   A.calcoaceticus-baumannii NOT DETECTED NOT DETECTED Final   Bacteroides fragilis NOT DETECTED NOT DETECTED Final   Enterobacterales NOT DETECTED NOT DETECTED Final   Enterobacter cloacae complex NOT DETECTED NOT DETECTED Final   Escherichia coli NOT DETECTED NOT DETECTED Final   Klebsiella aerogenes NOT DETECTED NOT DETECTED Final   Klebsiella oxytoca NOT  DETECTED NOT DETECTED Final   Klebsiella pneumoniae NOT DETECTED NOT DETECTED Final   Proteus species NOT DETECTED NOT DETECTED Final   Salmonella species NOT DETECTED NOT DETECTED Final   Serratia marcescens NOT DETECTED NOT DETECTED Final   Haemophilus influenzae NOT DETECTED NOT DETECTED Final   Neisseria meningitidis NOT DETECTED NOT DETECTED Final   Pseudomonas aeruginosa NOT DETECTED NOT DETECTED Final   Stenotrophomonas maltophilia NOT DETECTED NOT DETECTED Final   Candida albicans NOT DETECTED NOT DETECTED Final   Candida auris NOT DETECTED NOT DETECTED Final   Candida glabrata NOT DETECTED NOT DETECTED Final   Candida krusei NOT DETECTED NOT DETECTED Final   Candida parapsilosis NOT DETECTED NOT DETECTED Final   Candida tropicalis NOT DETECTED NOT DETECTED Final   Cryptococcus neoformans/gattii NOT DETECTED NOT DETECTED Final   Meth resistant mecA/C and MREJ NOT DETECTED NOT DETECTED Final    Comment: Performed at [facility], [address] [place], [place] [address]  Culture, blood (routine x 2)     Status: Abnormal   Collection Time: 09/13/20 11:20 AM   Specimen: BLOOD  Result Value Ref Range Status   Specimen Description   Final    BLOOD RIGHT ANTECUBITAL Performed at [facility], [address], [place], [place] [address]    Special Requests   Final    BOTTLES DRAWN AEROBIC AND ANAEROBIC Blood Culture adequate volume Performed at [facility], [address], [place], [place] [address]    Culture  Setup Time   Final    IN BOTH AEROBIC AND ANAEROBIC BOTTLES GRAM POSITIVE COCCI CRITICAL VALUE NOTED.  VALUE IS CONSISTENT WITH PREVIOUSLY REPORTED AND CALLED VALUE.    Culture (A)  Final    STAPHYLOCOCCUS AUREUS SUSCEPTIBILITIES PERFORMED ON PREVIOUS CULTURE WITHIN THE LAST 5 DAYS. Performed at [facility], [address], [place], [place] [address]    Report Status 09/16/2020 FINAL  Final  Resp Panel by RT-PCR (Flu  A&B, Covid) Nasopharyngeal Swab     Status: None   Collection Time: 09/13/20  2:01 PM   Specimen: Nasopharyngeal Swab; Nasopharyngeal(NP) swabs in vial transport medium  Result Value Ref Range Status   SARS Coronavirus 2 by RT PCR NEGATIVE NEGATIVE Final    Comment: (NOTE) SARS-CoV-2 target nucleic acids are NOT DETECTED.  The SARS-CoV-2 RNA is generally detectable in upper respiratory specimens during the acute phase of infection. The lowest concentration of SARS-CoV-2 viral copies this assay can detect is 138 copies/mL. A negative result does not preclude SARS-Cov-2 infection and should not be used as the sole basis for treatment or other patient management decisions. A negative result may occur with  improper specimen collection/handling, submission of specimen other than nasopharyngeal swab, presence of viral mutation(s) within the areas targeted by this assay, and inadequate number of viral copies(<138 copies/mL).  A negative result must be combined with clinical observations, patient history, and epidemiological information. The expected result is Negative.  Fact Sheet for Patients:  BloggerCourse.comhttps://www.fda.gov/media/152166/download  Fact Sheet for Healthcare Providers:  SeriousBroker.ithttps://www.fda.gov/media/152162/download  This test is no t yet approved or cleared by the [place] FDA and  has been authorized for detection and/or diagnosis of SARS-CoV-2 by FDA under an Emergency Use Authorization (EUA). This EUA will remain  in effect (meaning this test can be used) for the duration of the COVID-19 declaration under Section 564(b)(1) of the Act, 21 U.S.C.section 360bbb-3(b)(1), unless the authorization is terminated  or revoked sooner.       Influenza A by PCR NEGATIVE NEGATIVE Final   Influenza B by PCR NEGATIVE NEGATIVE Final    Comment: (NOTE) The Xpert Xpress SARS-CoV-2/FLU/RSV plus assay is intended as an aid in the diagnosis of influenza from Nasopharyngeal swab specimens  and should not be used as a sole basis for treatment. Nasal washings and aspirates are unacceptable for Xpert Xpress SARS-CoV-2/FLU/RSV testing.  Fact Sheet for Patients: BloggerCourse.comhttps://www.fda.gov/media/152166/download  Fact Sheet for Healthcare Providers: SeriousBroker.ithttps://www.fda.gov/media/152162/download  This test is not yet approved or cleared by the [place] FDA and has been authorized for detection and/or diagnosis of SARS-CoV-2 by FDA under an Emergency Use Authorization (EUA). This EUA will remain in effect (meaning this test can be used) for the duration of the COVID-19 declaration under Section 564(b)(1) of the Act, 21 U.S.C. section 360bbb-3(b)(1), unless the authorization is terminated or revoked.  Performed at [facility], [address] [place], [place] [address]   Culture, blood (routine x 2)     Status: None   Collection Time: 09/14/20  9:23 AM   Specimen: BLOOD RIGHT HAND  Result Value Ref Range Status   Specimen Description   Final    BLOOD RIGHT HAND Performed at [facility], [address] [place], [place] [address]    Special Requests   Final    BOTTLES DRAWN AEROBIC AND ANAEROBIC Blood Culture adequate volume Performed at [facility], [address] [place], [place] [address]    Culture   Final    NO GROWTH 5 DAYS Performed at [facility], [address] [place], [place] [address]    Report Status 09/19/2020 FINAL  Final  Culture, blood (routine x 2)     Status: None   Collection Time: 09/14/20  9:24 AM   Specimen: BLOOD LEFT HAND  Result Value Ref Range Status   Specimen Description   Final    BLOOD LEFT HAND Performed at [facility], [address] [place], [place] [address]    Special Requests   Final    BOTTLES DRAWN AEROBIC ONLY Blood Culture adequate volume Performed at [facility], [address] [place], [place] [address]    Culture    Final    NO GROWTH 5 DAYS Performed at [facility], [address] [place], [place] [address]    Report Status 09/19/2020 FINAL  Final  Aerobic/Anaerobic Culture (surgical/deep wound)     Status: None   Collection Time: 09/14/20 12:37 PM   Specimen: Abscess  Result Value Ref Range Status   Specimen Description   Final    ABSCESS  LEFT RP Performed at [facility], [address] [place], [place] [address]    Special Requests   Final    Normal Performed at [facility], [address] [place], [place] [address]    Gram Stain   Final    MODERATE WBC PRESENT, PREDOMINANTLY PMN ABUNDANT GRAM POSITIVE COCCI ABUNDANT GRAM NEGATIVE RODS    Culture   Final    ABUNDANT STAPHYLOCOCCUS AUREUS MODERATE BACTEROIDES ORALIS FEW PREVOTELLA BIVIA BETA LACTAMASE POSITIVE Performed at [facility], [address], [place], [place] [address]    Report Status 09/19/2020 FINAL  Final   Organism ID, Bacteria STAPHYLOCOCCUS AUREUS  Final      Susceptibility   Staphylococcus aureus - MIC*    CIPROFLOXACIN <=0.5 SENSITIVE Sensitive     ERYTHROMYCIN <=0.25 SENSITIVE Sensitive     GENTAMICIN <=0.5 SENSITIVE Sensitive     OXACILLIN <=0.25 SENSITIVE Sensitive     TETRACYCLINE <=1 SENSITIVE Sensitive     VANCOMYCIN <=0.5 SENSITIVE Sensitive     TRIMETH/SULFA <=10 SENSITIVE Sensitive     CLINDAMYCIN <=0.25 SENSITIVE Sensitive     RIFAMPIN <=0.5 SENSITIVE Sensitive     Inducible Clindamycin NEGATIVE Sensitive     * ABUNDANT STAPHYLOCOCCUS AUREUS  MRSA PCR Screening     Status: None   Collection Time: 09/15/20  3:22 AM   Specimen: Nasal Mucosa; Nasopharyngeal  Result Value Ref Range Status   MRSA by PCR NEGATIVE NEGATIVE Final    Comment:        The GeneXpert MRSA Assay (FDA approved for NASAL specimens only), is one component of a comprehensive MRSA colonization surveillance program. It is not intended to diagnose MRSA infection nor to  guide or monitor treatment for MRSA infections. Performed at [facility], [address] [place], [place] [address]     Impression/Plan:  1.  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 willl need a prolonged course with such a disseminated infection.   2. Hip and thigh infection - for surgery today for I and D of groin.  Has had a left hip THA

## 2020-09-22 NOTE — Anesthesia Procedure Notes (Signed)
Procedure Name: LMA Insertion Date/Time: 09/22/2020 2:35 PM Performed by: Takayla Baillie D, CRNA Pre-anesthesia Checklist: Patient identified, Emergency Drugs available, Suction available and Patient being monitored Patient Re-evaluated:Patient Re-evaluated prior to induction Oxygen Delivery Method: Circle system utilized Preoxygenation: Pre-oxygenation with 100% oxygen Induction Type: IV induction Ventilation: Mask ventilation without difficulty LMA: LMA inserted LMA Size: 4.0 Tube type: Oral Number of attempts: 1 Placement Confirmation: positive ETCO2 and breath sounds checked- equal and bilateral Tube secured with: Tape Dental Injury: Teeth and Oropharynx as per pre-operative assessment

## 2020-09-22 NOTE — Progress Notes (Signed)
IR.  History of left hip septic arthritis complicated by development of left retroperitoneal and left thigh fluid collections concerning for abscesses s/p drain placement x3 (left retroperitoneal, medial left thigh, lateral left thigh) in IR 09/14/2020; s/p removal of left retroperitoneal drain in OR 09/20/2020; with repeat CT revealing persistent left anterior thigh fluid collection concerning for abscess.  CT abdomen/pelvis/left femur 09/21/2020: 1. Stable position of the LEFT inguinal approach surgical drain with pigtail terminating at the site of a previously demonstrated fluid collection in the LEFT lower psoas muscle. This psoas muscle collection has resolved in the interval. 2. The additional complex fluid collection within the iliacus muscle is also significantly improved and nearly resolved, perhaps a small residual component overlying the LEFT SI joint. 3. Moderate to large ascites, similar to the previous CT of 09/18/2020. No new fluid collection or abscess. 4. Cirrhotic-appearing liver. 5. Largest abscess collections within the medial aspect of the LEFT thigh are no longer seen status post interval incision and drainage. Expected postsurgical changes of the overlying soft tissues. No evidence of surgical complicating feature. 6. Stable smaller abscess collection anterior to the LEFT femur, measuring 3.3 cm greatest dimension. 7. Osseous femur appears intact and normal in mineralization. No evidence of associated osteomyelitis.  Dr. Dion Saucier (orthopedic surgery) spoke with Dr. Archer Asa (IR) regarding possible left anterior thigh fluid collection aspiration with possible drain placement. Case/images have been reviewed by Dr. Archer Asa who approves procedure.  Patient scheduled for left groin I&D in OR today with general surgery. Went to see patient in short stay regarding possible procedure timing (today following OR versus tomorrow pending IR schedule). Discussed above findings with patient and  planned procedure. Patient is tearful on exam, requests that procedure does not occur today. In addition, does not wish to discuss procedure/give consent until tomorrow.  Plan for possible image-guided left anterior thigh fluid collection aspiration with possible drain placement in IR tentatively for tomorrow 09/23/2020 pending IR scheduling. Patient will be NPO at midnight. Please hold all anticoagulation until post-procedure. Patient will be seen/consented to procedure tomorrow per his request. IR to follow.   Waylan Boga Soyla Bainter, PA-C 09/22/2020, 3:09 PM

## 2020-09-22 NOTE — Transfer of Care (Signed)
Immediate Anesthesia Transfer of Care Note  Patient: [name]  Procedure(s) Performed: IRRIGATION AND DEBRIDEMENT LEFT THIGH ABSCESS (Left )  Patient Location: PACU  Anesthesia Type:General  Level of Consciousness: awake, alert  and oriented  Airway & Oxygen Therapy: Patient Spontanous Breathing and Patient connected to face mask oxygen  Post-op Assessment: Report given to RN and Post -op Vital signs reviewed and stable  Post vital signs: Reviewed and stable  Last Vitals:  Vitals Value Taken Time  BP    Temp    Pulse 113 09/22/20 1535  Resp 16 09/22/20 1535  SpO2 100 % 09/22/20 1535  Vitals shown include unvalidated device data.  Last Pain:  Vitals:   09/22/20 1344  TempSrc: Oral  PainSc:       Patients Stated Pain Goal: 3 (09/21/20 2115)  Complications: No complications documented.

## 2020-09-22 NOTE — Interval H&P Note (Signed)
History and Physical Interval Note:  09/22/2020 2:17 PM  [name]  has presented today for surgery, with the diagnosis of LEFT THIGH ABSCESS.  The various methods of treatment have been discussed with the patient and family. After consideration of risks, benefits and other options for treatment, the patient has consented to  Procedure(s) with comments: IRRIGATION AND DEBRIDEMENT LEFT THIGH ABSCESS (Left) - DOW ROOM STARTING AT 09:00AM FOR 60 MIN as a surgical intervention.  The patient's history has been reviewed, patient examined, no change in status, stable for surgery.  I have reviewed the patient's chart and labs.  Questions were answered to the patient's satisfaction.     Latronda Spink A Fernand Sorbello

## 2020-09-22 NOTE — Progress Notes (Signed)
IP rehab admissions - Awaiting further progress with therapies prior to seeking authorization from insurance carrier for acute inpatient rehab admission.  Call for questions.  [phone]

## 2020-09-22 NOTE — Anesthesia Postprocedure Evaluation (Signed)
Anesthesia Post Note  Patient: [name]  Procedure(s) Performed: IRRIGATION AND DEBRIDEMENT LEFT THIGH ABSCESS (Left )     Patient location during evaluation: PACU Anesthesia Type: General Level of consciousness: awake and alert Pain management: pain level controlled Vital Signs Assessment: post-procedure vital signs reviewed and stable Respiratory status: spontaneous breathing, nonlabored ventilation, respiratory function stable and patient connected to nasal cannula oxygen Cardiovascular status: blood pressure returned to baseline and stable Postop Assessment: no apparent nausea or vomiting Anesthetic complications: no   No complications documented.  Last Vitals:  Vitals:   09/22/20 1645 09/22/20 1700  BP:  113/79  Pulse:  (!) 110  Resp:  (!) 21  Temp: 37.1 C   SpO2:  97%    Last Pain:  Vitals:   09/22/20 1645  TempSrc: Oral  PainSc:                  Shelton Silvas

## 2020-09-22 NOTE — Progress Notes (Signed)
PROGRESS NOTE    [name]  CXK:[id number] DOB: [birth year] DOA: 09/13/2020 PCP: [name], PA   Brief Narrative:   Patient is a 35 yo male with past medical history significant for AVN of the hip, HTN, and GERD.  Patient was recently admitted on 08/22/20 for COVID-19 and was discharge on 08/29/20. About two days post discharge, patient began to feel extremely weak and tired with symptoms progressively worsening over the next two weeks.  During this time. He also developed left leg thigh and legs pain along with swelling and tenderness to touch.  He also developed fluid filled blisters and skin sloughing on the medial portion of his left thigh extending into his left groin.  Symptoms escalated in intensity until his was unable to walk without significant pain.  Patient saw his PCP on 09/12/20 for a lab draw and was called by his PCP office to go to the ED immediately.  ED Course: Hgb 6.2 with 2U PRBC's given, FOBT negative. CT abdomen showed left iliopsoas and left gluteal abscesses w/ diffuse swelling and left hip effusion. CCS was consulted. Patient was started on cefazolin and vanc.  Interim History: Blood cultures positive for MSSA bacteremia with ID consulted, repeat blood cultures, and transesophageal echo obtained. He underwent 3 drain placement by IR for abscesses in his iliopsoas muscle and multiple abscesses in the legs.  On 09/15/20, he had a left hip Interposition arthoplasty with antibiotic spacer placement and debridement and developed decompensated septic shock with refractory hypotension despite 2 unit PRBCs, 2.2 L of crystalloid and 500 mL of albumin. He was admitted to the ICU with vasopressor support.  He is now hemodynamically stable on [facility] service.   Current plan is for left groin I/D on 09/22/20 by Dr. Brantley Stage and repeat CT scan on 3/3 with possible drain placement on 09/23/20   Assessment & Plan:   Principal Problem:   MSSA bacteremia Active Problems:    Alcohol dependence ([place])   Alcoholic hepatitis   Sepsis ([place])   Septic arthritis ([place])   Iliopsoas abscess on left [facility])   #MSSA bacteremia/ Left hip septic arthritis with iliopsoas, left gluteal, and left medial thigh abscess/ Left hip effusion and septic arthritis in the setting of avascular necrosis -Initially started on IV vanc,cefepime, but changed to IV Cefazolin by ID -Initial Blood Cx 09/13/20 showed Staph Aureus in both sets of Cx's, transthoracic echo negative for vegetation, normal valvular and systolic function  -Sepsis Criteria met with: tachycardia, tachypnea, lactic acid 3.3, and Leukocytosis was 10.6 source: abscesses as above as well as bacteremia and went into septic shock this admission. -Repeat blood cultures negative, continue course of cefazolin per ID. -09/19/20 TEE showed normal valves and no evidence of vegetation. - Wound cultures positive for S. Aureus, bacteroides, Prevotella - WBC's trending down to 11.9 from max of 15.1, afebrile - Continues to have pain but less severe, on day 5 metridazole, day 10 cefazolin - ID considering possible PICC placement given prolonged 6 week course of IV cefazolin -2/22 CT Abdomen shows interval development of innumerable fluid collections are seen within the musculature of the left ileopsoas muscle as well as the musculature of the visualized portion of the left gluteal region and proximal left thigh consistent with innumerable abscesses - 2/24 left hip total arthroplasty with cement interposition spacer  - 3/1 left thigh irrigation and debridement with wound vac placement- post op day 2 - Surgery plans for left groin I/D tomorrow 3/3 by Dr. Brantley Stage - Per  IR, repeat CT scan on 3/3 with possible drain placement on 09/23/20  #Scrotal edema/anasarca in setting of IVF resuscitation, MSSA bacteremia, sepsis, and hepatic cirrhosis -Scrotal edema, and left groin and leg swelling persists -Scrotal U/S Marked asymmetric left scrotal wall  thickening and fluid, with increased blood flow. Which is consistent with soft tissue infection. -IVF stopped with IV antibiotics as above and supportive measures such as extremity and scrotal elevation  #Symptommatic anemia/macrocytic anemia -On admission H/H 6.2/18.9 s/p 6 U PRBC's total this admission, currently 7.8/23.5, asymptomatic, FOBC negative -Anemia panel: iron level of 44, UIBC 131, TIBC 175, saturation ratios of 25% -Approximately 100 ml sanguinous drainage from wound vac O/N -Transfused 1U on 09/21/20 for Hgb < 7, trend H/H post transfusion  #Sepsis/Metabolic Acidosis/Electrolyte imbalances (Hyponatremia and Hypophosphatemia) -Sepsis and metabolic acidosis resolved, anion gap closed, d/c IVF due to anasarca -Patient's phosphorus level is 2.1, following repletion on 3/1 withIV Sodium Phos 20 mmol -Sodium is now126, down from 129 yesterday, replete with Sodium Phos IV, and further diurese with Lasix  Hx of Alcoholism/ Alcoholic Hepatitis/HepaticCirrhosis, Hyperbilirubinemia/ Mildly abnormal AST -2/22 CT Abdomen consistent with hepatic cirrhosis -Initially placed on prednisolone, 84m through 09/26/20, followed by a taper. Per GI given  disseminated MSSA bacteremia taper discontinued -Per patient drinks 12-18 beers weekly -Liver panel: total Bili 1.5 improved from peak of 3.2, albumin 1.7, alk phos 214, AST 46->30, ammonia 45 -Continue lactulose 10 g p.o. twice daily, thiamine, magnesium -Patient and family report he has been alcohol free for 1 month, counsel on continued cessation  #Difficulty voiding rt fluid volume overload -Patient current has Foley and was diuresed with 40 mg IV Lasix over the past several days -Voiding trial to d/c Foley  #Tachycardia, hypertension, currently sinus tachy rate 100-110 -Continue Carvedilol 3.125 mg p.o. twice daily  # Pain control -Continue oxycodone prn  #Hypomagesia -Repleted, currently 1.7 -Continue to trend.  # Protein  Malnutrition, mild -Per dietitian, patient's girlfriend to bring in FHazardshakes which he was consuming PTA  #Thrombocytopenia, in setting of cirrhosis -No s/s of bleeding Plts 104,000 down from 170,000  #GAD -Continue sertraline 25 mg p.o. nightly  #GERD -C/w Pantoprazole 40 mg p.o. twice daily   DVT prophylaxis: SCD's Code Status: Full Family Communication: Girlfriend at bedside  Status is: Inpatient  Dispo: The patient is from: Home              Anticipated d/c is to: Home              Patient currently is not medically stable for discharge.   Difficult to place patient: No.  Body mass index is 29.7 kg/m.   Consultants:   General Surgery  Orthopedic Surgery  Infectious Diseases  Interventional Radiology  Urology  Procedures:  -2/24 left hip total arthroplasty with cement interposition spacer  -3/1 left thigh irrigation and debridement with wound vac placement, post op day 1 -3/3 left groin I/D on 09/22/20 by Dr. CBrantley Stage  Antimicrobials:   Ancef IV  Metronidazole po  Subjective:  Patient alert and awake with plan to have I/D done today.  No major issues or complaints overnight.  Still has pain in left thigh and groin.  Examination:  General exam: Appears calm and comfortable  Respiratory system: Clear to auscultation. Respiratory effort normal. Cardiovascular system: S1 & S2 heard, RRR. No JVD, murmurs, rubs, gallops or clicks. No pedal edema. Gastrointestinal system: Abdomen is nondistended, soft and nontender. No organomegaly or masses felt. Normal bowel sounds  heard. Abdomen: Anasarca present without fluid wave. Has lower extremity drains from his left hip with 2 drains and one wound vac. Dressing site for drains clean and dry, wound vac 100 cc of sanguineus drainage overnight. Extremities: Left lower leg is edematous with swelling, redness, and blistering of medial aspect of left thigh into left groin and scrotum. GU: Scrotal edema and  left groin blistering with adjacent area of left thigh also affected. Perineal area and right buttock also has erythema. Central nervous system: Alert and oriented. No focal neurological deficits. Extremities: Symmetric 5 x 5 power. Skin: No rashes, lesions or ulcers Psychiatry: Judgement and insight appear normal. Mood & affect appropriate.    Objective: Vitals:   09/22/20 0600 09/22/20 0730 09/22/20 0800 09/22/20 0900  BP: 116/76  127/78 129/80  Pulse: (!) 108  (!) 117 (!) 123  Resp: 19  19 (!) 21  Temp:  99.5 F (37.5 C)    TempSrc:  Axillary    SpO2: 96%  95% 93%  Weight:      Height:        Intake/Output Summary (Last 24 hours) at 09/22/2020 1141 Last data filed at 09/22/2020 0647 Gross per 24 hour  Intake 296.91 ml  Output 1990 ml  Net -1693.09 ml   Filed Weights   09/19/20 0500 09/19/20 0843 09/21/20 0500  Weight: 91.8 kg 97.8 kg 93.9 kg     Data Reviewed:   CBC: Recent Labs  Lab 09/17/20 0511 09/18/20 0303 09/19/20 0305 09/20/20 0244 09/21/20 0234 09/21/20 1117  WBC 12.1* 15.1* 14.5* 10.7* 11.9*  --   NEUTROABS 9.6* 12.2* 11.8* 8.3* 9.8*  --   HGB 7.5* 9.8* 8.8* 7.7* 6.4* 7.8*  HCT 22.5* 30.2* 26.6* 24.0* 20.3* 23.5*  MCV 96.6 97.7 98.5 100.0 102.0*  --   PLT 72* 86* 85* 99* 104*  --    Basic Metabolic Panel: Recent Labs  Lab 09/17/20 0511 09/18/20 0303 09/19/20 0305 09/20/20 0244 09/21/20 0234  NA 131* 129* 129* 131* 126*  K 4.3 4.4 4.4 4.3 4.8  CL 102 98 96* 95* 95*  CO2 _0 GLUCOSE 127* 91 100* 96 173*  BUN _1 CREATININE 0.38* 0.44* 0.41* 0.43* 0.34*  CALCIUM 7.2* 7.5* 7.4* 7.4* 7.1*  MG 2.0 1.9 1.9 1.9 1.7  PHOS 1.6* 1.6* 3.4 2.7 3.1   GFR: Estimated Creatinine Clearance: 147 mL/min (A) (by C-G formula based on SCr of 0.34 mg/dL (L)). Liver Function Tests: Recent Labs  Lab 09/17/20 0511 09/18/20 0303 09/19/20 0305 09/20/20 0244 09/21/20 0234  AST 43* 61* 39 30 30  ALT _2 ALKPHOS 165* 270*  244* 240* 214*  BILITOT 1.3* 1.7* 1.7* 1.6* 1.5*  PROT 4.9* 5.7* 5.2* 5.2* 4.8*  ALBUMIN 2.0* 2.3* 2.0* 1.9* 1.7*   No results for input(s): LIPASE, AMYLASE in the last 168 hours. No results for input(s): AMMONIA in the last 168 hours. Coagulation Profile: Recent Labs  Lab 09/15/20 2300 09/19/20 0305  INR 2.2* 1.5*   Cardiac Enzymes: No results for input(s): CKTOTAL, CKMB, CKMBINDEX, TROPONINI in the last 168 hours. BNP (last 3 results) No results for input(s): PROBNP in the last 8760 hours. HbA1C: No results for input(s): HGBA1C in the last 72 hours. CBG: Recent Labs  Lab 09/15/20 2116 09/15/20 2152  GLUCAP 61* 86   Lipid Profile: No results for input(s): CHOL, HDL, LDLCALC, TRIG, CHOLHDL, LDLDIRECT in the last 72 hours. Thyroid Function  Tests: No results for input(s): TSH, T4TOTAL, FREET4, T3FREE, THYROIDAB in the last 72 hours. Anemia Panel: No results for input(s): VITAMINB12, FOLATE, FERRITIN, TIBC, IRON, RETICCTPCT in the last 72 hours. Sepsis Labs: Recent Labs  Lab 09/15/20 2052 09/15/20 2300 09/18/20 0843 09/18/20 1105 09/19/20 0305 09/20/20 0244  PROCALCITON  --  21.56 7.78  --  4.14 2.02  LATICACIDVEN 5.0* 5.3* 2.8* 3.1*  --   --     Recent Results (from the past 240 hour(s))  Culture, blood (routine x 2)     Status: Abnormal   Collection Time: 09/13/20 10:45 AM   Specimen: BLOOD  Result Value Ref Range Status   Specimen Description   Final    BLOOD RIGHT ANTECUBITAL Performed at [facility], [place] [address] [place], [place] [address]    Special Requests   Final    BOTTLES DRAWN AEROBIC AND ANAEROBIC Blood Culture adequate volume Performed at [place] [address] [place], [place] [address]    Culture  Setup Time   Final    IN BOTH AEROBIC AND ANAEROBIC BOTTLES GRAM POSITIVE COCCI CRITICAL RESULT CALLED TO, READ BACK BY AND VERIFIED WITHSeleta Rhymes [facility] 09/13/20 0347 JDW Performed at [facility], [place] [address] [place], [place] [address]    Culture STAPHYLOCOCCUS AUREUS (A)  Final   Report Status 09/16/2020 FINAL  Final   Organism ID, Bacteria STAPHYLOCOCCUS AUREUS  Final      Susceptibility   Staphylococcus aureus - MIC*    CIPROFLOXACIN <=0.5 SENSITIVE Sensitive     ERYTHROMYCIN <=0.25 SENSITIVE Sensitive     GENTAMICIN <=0.5 SENSITIVE Sensitive     OXACILLIN <=0.25 SENSITIVE Sensitive     TETRACYCLINE <=1 SENSITIVE Sensitive     VANCOMYCIN <=0.5 SENSITIVE Sensitive     TRIMETH/SULFA <=10 SENSITIVE Sensitive     CLINDAMYCIN <=0.25 SENSITIVE Sensitive     RIFAMPIN <=0.5 SENSITIVE Sensitive     Inducible Clindamycin NEGATIVE Sensitive     * STAPHYLOCOCCUS AUREUS  Blood Culture ID Panel (Reflexed)     Status: Abnormal   Collection Time: 09/13/20 10:45 AM  Result Value Ref Range Status   Enterococcus faecalis NOT DETECTED NOT DETECTED Final   Enterococcus Faecium NOT DETECTED NOT DETECTED Final   Listeria monocytogenes NOT DETECTED NOT DETECTED Final   Staphylococcus species DETECTED (A) NOT DETECTED Final    Comment: CRITICAL RESULT CALLED TO, READ BACK BY AND VERIFIED WITH: E JACKSON PHARMD 09/13/20 0347 JDW    Staphylococcus aureus (BCID) DETECTED (A) NOT DETECTED Final    Comment: CRITICAL RESULT CALLED TO, READ BACK BY AND VERIFIED WITH: E JACKSON PHARMD 09/13/20 0347 JDW    Staphylococcus epidermidis NOT DETECTED NOT DETECTED Final   Staphylococcus lugdunensis NOT DETECTED NOT DETECTED Final   Streptococcus species NOT DETECTED NOT DETECTED Final   Streptococcus agalactiae NOT DETECTED NOT DETECTED Final   Streptococcus pneumoniae NOT DETECTED NOT DETECTED Final   Streptococcus pyogenes NOT DETECTED NOT DETECTED Final   A.calcoaceticus-baumannii NOT DETECTED NOT DETECTED Final   Bacteroides fragilis NOT DETECTED NOT DETECTED Final   Enterobacterales NOT DETECTED NOT DETECTED Final   Enterobacter cloacae complex NOT DETECTED NOT DETECTED Final    Escherichia coli NOT DETECTED NOT DETECTED Final   Klebsiella aerogenes NOT DETECTED NOT DETECTED Final   Klebsiella oxytoca NOT DETECTED NOT DETECTED Final   Klebsiella pneumoniae NOT DETECTED NOT DETECTED Final   Proteus species NOT DETECTED NOT DETECTED Final   Salmonella species  NOT DETECTED NOT DETECTED Final   Serratia marcescens NOT DETECTED NOT DETECTED Final   Haemophilus influenzae NOT DETECTED NOT DETECTED Final   Neisseria meningitidis NOT DETECTED NOT DETECTED Final   Pseudomonas aeruginosa NOT DETECTED NOT DETECTED Final   Stenotrophomonas maltophilia NOT DETECTED NOT DETECTED Final   Candida albicans NOT DETECTED NOT DETECTED Final   Candida auris NOT DETECTED NOT DETECTED Final   Candida glabrata NOT DETECTED NOT DETECTED Final   Candida krusei NOT DETECTED NOT DETECTED Final   Candida parapsilosis NOT DETECTED NOT DETECTED Final   Candida tropicalis NOT DETECTED NOT DETECTED Final   Cryptococcus neoformans/gattii NOT DETECTED NOT DETECTED Final   Meth resistant mecA/C and MREJ NOT DETECTED NOT DETECTED Final    Comment: Performed at [facility], [place] [address], [place], [place] [address]  Culture, blood (routine x 2)     Status: Abnormal   Collection Time: 09/13/20 11:20 AM   Specimen: BLOOD  Result Value Ref Range Status   Specimen Description   Final    BLOOD RIGHT ANTECUBITAL Performed at [place] [address] [place], [place] [address]    Special Requests   Final    BOTTLES DRAWN AEROBIC AND ANAEROBIC Blood Culture adequate volume Performed at [place] [address] [place], [place] [address]    Culture  Setup Time   Final    IN BOTH AEROBIC AND ANAEROBIC BOTTLES GRAM POSITIVE COCCI CRITICAL VALUE NOTED.  VALUE IS CONSISTENT WITH PREVIOUSLY REPORTED AND CALLED VALUE.    Culture (A)  Final    STAPHYLOCOCCUS AUREUS SUSCEPTIBILITIES PERFORMED ON PREVIOUS CULTURE WITHIN THE LAST 5 DAYS. Performed at  [facility], [place] [address], [place], [place] [address]    Report Status 09/16/2020 FINAL  Final  Resp Panel by RT-PCR (Flu A&B, Covid) Nasopharyngeal Swab     Status: None   Collection Time: 09/13/20  2:01 PM   Specimen: Nasopharyngeal Swab; Nasopharyngeal(NP) swabs in vial transport medium  Result Value Ref Range Status   SARS Coronavirus 2 by RT PCR NEGATIVE NEGATIVE Final    Comment: (NOTE) SARS-CoV-2 target nucleic acids are NOT DETECTED.  The SARS-CoV-2 RNA is generally detectable in upper respiratory specimens during the acute phase of infection. The lowest concentration of SARS-CoV-2 viral copies this assay can detect is 138 copies/mL. A negative result does not preclude SARS-Cov-2 infection and should not be used as the sole basis for treatment or other patient management decisions. A negative result may occur with  improper specimen collection/handling, submission of specimen other than nasopharyngeal swab, presence of viral mutation(s) within the areas targeted by this assay, and inadequate number of viral copies(<138 copies/mL). A negative result must be combined with clinical observations, patient history, and epidemiological information. The expected result is Negative.  Fact Sheet for Patients:  EntrepreneurPulse.com.au  Fact Sheet for Healthcare Providers:  IncredibleEmployment.be  This test is no t yet approved or cleared by the [place] FDA and  has been authorized for detection and/or diagnosis of SARS-CoV-2 by FDA under an Emergency Use Authorization (EUA). This EUA will remain  in effect (meaning this test can be used) for the duration of the COVID-19 declaration under Section 564(b)(1) of the Act, 21 U.S.C.section 360bbb-3(b)(1), unless the authorization is terminated  or revoked sooner.       Influenza A by PCR NEGATIVE NEGATIVE Final   Influenza B by PCR NEGATIVE NEGATIVE Final    Comment: (NOTE) The  Xpert  Xpress SARS-CoV-2/FLU/RSV plus assay is intended as an aid in the diagnosis of influenza from Nasopharyngeal swab specimens and should not be used as a sole basis for treatment. Nasal washings and aspirates are unacceptable for Xpert Xpress SARS-CoV-2/FLU/RSV testing.  Fact Sheet for Patients: EntrepreneurPulse.com.au  Fact Sheet for Healthcare Providers: IncredibleEmployment.be  This test is not yet approved or cleared by the [place] FDA and has been authorized for detection and/or diagnosis of SARS-CoV-2 by FDA under an Emergency Use Authorization (EUA). This EUA will remain in effect (meaning this test can be used) for the duration of the COVID-19 declaration under Section 564(b)(1) of the Act, 21 U.S.C. section 360bbb-3(b)(1), unless the authorization is terminated or revoked.  Performed at [facility], [place] [address] [place], [place] [address]   Culture, blood (routine x 2)     Status: None   Collection Time: 09/14/20  9:23 AM   Specimen: BLOOD RIGHT HAND  Result Value Ref Range Status   Specimen Description   Final    BLOOD RIGHT HAND Performed at [place] [address], [place], [place] [address]    Special Requests   Final    BOTTLES DRAWN AEROBIC AND ANAEROBIC Blood Culture adequate volume Performed at [place] [address] [place], [place] [address]    Culture   Final    NO GROWTH 5 DAYS Performed at [facility], [place] [address], [place], [place] [address]    Report Status 09/19/2020 FINAL  Final  Culture, blood (routine x 2)     Status: None   Collection Time: 09/14/20  9:24 AM   Specimen: BLOOD LEFT HAND  Result Value Ref Range Status   Specimen Description   Final    BLOOD LEFT HAND Performed at [place] [address], [place], [place] [address]    Special Requests   Final    BOTTLES DRAWN AEROBIC ONLY  Blood Culture adequate volume Performed at [place] [address] [place], [place] [address]    Culture   Final    NO GROWTH 5 DAYS Performed at [facility], [place] [address] [place], [place] [address]    Report Status 09/19/2020 FINAL  Final  Aerobic/Anaerobic Culture (surgical/deep wound)     Status: None   Collection Time: 09/14/20 12:37 PM   Specimen: Abscess  Result Value Ref Range Status   Specimen Description   Final    ABSCESS  LEFT RP Performed at [place] [address], [place], [place] [address]    Special Requests   Final    Normal Performed at [facility], [place] [address] [place], [place] [address]    Gram Stain   Final    MODERATE WBC PRESENT, PREDOMINANTLY PMN ABUNDANT GRAM POSITIVE COCCI ABUNDANT GRAM NEGATIVE RODS    Culture   Final    ABUNDANT STAPHYLOCOCCUS AUREUS MODERATE BACTEROIDES ORALIS FEW PREVOTELLA BIVIA BETA LACTAMASE POSITIVE Performed at [facility], [place] [address] [place], [place] [address]    Report Status 09/19/2020 FINAL  Final   Organism ID, Bacteria STAPHYLOCOCCUS AUREUS  Final      Susceptibility   Staphylococcus aureus - MIC*    CIPROFLOXACIN <=0.5 SENSITIVE Sensitive     ERYTHROMYCIN <=0.25 SENSITIVE Sensitive     GENTAMICIN <=0.5 SENSITIVE Sensitive     OXACILLIN <=0.25 SENSITIVE Sensitive     TETRACYCLINE <=1 SENSITIVE Sensitive  VANCOMYCIN <=0.5 SENSITIVE Sensitive     TRIMETH/SULFA <=10 SENSITIVE Sensitive     CLINDAMYCIN <=0.25 SENSITIVE Sensitive     RIFAMPIN <=0.5 SENSITIVE Sensitive     Inducible Clindamycin NEGATIVE Sensitive     * ABUNDANT STAPHYLOCOCCUS AUREUS  MRSA PCR Screening     Status: None   Collection Time: 09/15/20  3:22 AM   Specimen: Nasal Mucosa; Nasopharyngeal  Result Value Ref Range Status   MRSA by PCR NEGATIVE NEGATIVE Final    Comment:        The GeneXpert MRSA Assay (FDA approved for NASAL specimens only), is  one component of a comprehensive MRSA colonization surveillance program. It is not intended to diagnose MRSA infection nor to guide or monitor treatment for MRSA infections. Performed at [facility], [place] [address] [place], [place] [address]          Radiology Studies: CT ABDOMEN PELVIS W CONTRAST  Result Date: 09/21/2020 CLINICAL DATA:  Abdominal abscess/infection suspected. Status post orthopedic surgery on 09/15/2020. LEFT thigh incision and drainage on 09/20/2020. EXAM: CT ABDOMEN AND PELVIS WITH CONTRAST CT LEFT FEMUR WITH CONTRAST TECHNIQUE: Multidetector CT imaging of the abdomen and pelvis was performed using the standard protocol following bolus administration of intravenous contrast. CONTRAST:  112m OMNIPAQUE IOHEXOL 300 MG/ML  SOLN COMPARISON:  CT abdomen pelvis dated 09/18/2020. CT LEFT femur dated 09/18/2020. FINDINGS: CT abdomen AND PELVIS FINDINGS: Lower chest: Mild bibasilar atelectasis. Hepatobiliary: Cirrhotic-appearing liver. No focal mass or lesion is seen within the liver. Gallbladder is unremarkable. No bile duct dilatation. Pancreas: Unremarkable. No pancreatic ductal dilatation or surrounding inflammatory changes. Spleen: Perhaps mild splenomegaly.  Otherwise unremarkable. Adrenals/Urinary Tract: Adrenal glands appear normal. Kidneys are unremarkable without mass, stone or hydronephrosis. No ureteral or bladder calculi are identified. Bladder is decompressed by Foley catheter. Stomach/Bowel: No dilated large or small bowel loops. Stomach is unremarkable. Vascular/Lymphatic: No acute appearing vascular abnormality. No enlarged lymph nodes are seen. Reproductive: Prostate gland is unremarkable. Other: Moderate to large ascites, similar to the previous CT of 09/18/2020. No new fluid collection or abscess. No free intraperitoneal air. Musculoskeletal: No acute appearing osseous abnormality. Stable position of the LEFT inguinal approach surgical drain with  pigtail terminating at the site of a previously demonstrated fluid collection in the LEFT lower psoas muscle. This collection has resolved in the interval. The additional complex fluid collection within the iliacus muscle is also significantly improved and nearly resolved, perhaps a small residual component overlying the LEFT SI joint (series 5, image 81). Persistent anasarca without significant change. CT LEFT FEMUR FINDINGS: Interval surgical evacuation of the abscess collection in the medial LEFT thigh. No persistent fluid collection is seen, with expected postsurgical changes of the overlying soft tissues and with associated packing in place at the overlying skin defect. Grossly stable abscess collection anterior to the upper LEFT femur, measuring 3.3 cm (series 11, image 150). The larger fluid collection seen at this level on the previous study is no longer identified status post the interval incision and drainage. The drainage catheters have been removed from the upper thigh. No new fluid collection or abscess-like collection is identified within the soft tissues about the LEFT femur. Soft tissues about the LEFT knee are unremarkable. No hemorrhage or contrast extravasation is seen within the soft tissues about the LEFT femur. Osseous femur itself appears intact and normal in mineralization. No cortical defect, lucency or destructive change to suggest associated osteomyelitis. IMPRESSION: 1. Stable position of the LEFT inguinal approach surgical  drain with pigtail terminating at the site of a previously demonstrated fluid collection in the LEFT lower psoas muscle. This psoas muscle collection has resolved in the interval. 2. The additional complex fluid collection within the iliacus muscle is also significantly improved and nearly resolved, perhaps a small residual component overlying the LEFT SI joint. 3. Moderate to large ascites, similar to the previous CT of 09/18/2020. No new fluid collection or abscess.  4. Cirrhotic-appearing liver. 5. Largest abscess collections within the medial aspect of the LEFT thigh are no longer seen status post interval incision and drainage. Expected postsurgical changes of the overlying soft tissues. No evidence of surgical complicating feature. 6. Stable smaller abscess collection anterior to the LEFT femur, measuring 3.3 cm greatest dimension. 7. Osseous femur appears intact and normal in mineralization. No evidence of associated osteomyelitis. Electronically Signed   By: Franki Cabot M.D.   On: 09/21/2020 17:42   CT FEMUR LEFT W CONTRAST  Result Date: 09/21/2020 CLINICAL DATA:  Abdominal abscess/infection suspected. Status post orthopedic surgery on 09/15/2020. LEFT thigh incision and drainage on 09/20/2020. EXAM: CT ABDOMEN AND PELVIS WITH CONTRAST CT LEFT FEMUR WITH CONTRAST TECHNIQUE: Multidetector CT imaging of the abdomen and pelvis was performed using the standard protocol following bolus administration of intravenous contrast. CONTRAST:  142m OMNIPAQUE IOHEXOL 300 MG/ML  SOLN COMPARISON:  CT abdomen pelvis dated 09/18/2020. CT LEFT femur dated 09/18/2020. FINDINGS: CT abdomen AND PELVIS FINDINGS: Lower chest: Mild bibasilar atelectasis. Hepatobiliary: Cirrhotic-appearing liver. No focal mass or lesion is seen within the liver. Gallbladder is unremarkable. No bile duct dilatation. Pancreas: Unremarkable. No pancreatic ductal dilatation or surrounding inflammatory changes. Spleen: Perhaps mild splenomegaly.  Otherwise unremarkable. Adrenals/Urinary Tract: Adrenal glands appear normal. Kidneys are unremarkable without mass, stone or hydronephrosis. No ureteral or bladder calculi are identified. Bladder is decompressed by Foley catheter. Stomach/Bowel: No dilated large or small bowel loops. Stomach is unremarkable. Vascular/Lymphatic: No acute appearing vascular abnormality. No enlarged lymph nodes are seen. Reproductive: Prostate gland is unremarkable. Other: Moderate to large  ascites, similar to the previous CT of 09/18/2020. No new fluid collection or abscess. No free intraperitoneal air. Musculoskeletal: No acute appearing osseous abnormality. Stable position of the LEFT inguinal approach surgical drain with pigtail terminating at the site of a previously demonstrated fluid collection in the LEFT lower psoas muscle. This collection has resolved in the interval. The additional complex fluid collection within the iliacus muscle is also significantly improved and nearly resolved, perhaps a small residual component overlying the LEFT SI joint (series 5, image 81). Persistent anasarca without significant change. CT LEFT FEMUR FINDINGS: Interval surgical evacuation of the abscess collection in the medial LEFT thigh. No persistent fluid collection is seen, with expected postsurgical changes of the overlying soft tissues and with associated packing in place at the overlying skin defect. Grossly stable abscess collection anterior to the upper LEFT femur, measuring 3.3 cm (series 11, image 150). The larger fluid collection seen at this level on the previous study is no longer identified status post the interval incision and drainage. The drainage catheters have been removed from the upper thigh. No new fluid collection or abscess-like collection is identified within the soft tissues about the LEFT femur. Soft tissues about the LEFT knee are unremarkable. No hemorrhage or contrast extravasation is seen within the soft tissues about the LEFT femur. Osseous femur itself appears intact and normal in mineralization. No cortical defect, lucency or destructive change to suggest associated osteomyelitis. IMPRESSION: 1. Stable position of the LEFT  inguinal approach surgical drain with pigtail terminating at the site of a previously demonstrated fluid collection in the LEFT lower psoas muscle. This psoas muscle collection has resolved in the interval. 2. The additional complex fluid collection within the  iliacus muscle is also significantly improved and nearly resolved, perhaps a small residual component overlying the LEFT SI joint. 3. Moderate to large ascites, similar to the previous CT of 09/18/2020. No new fluid collection or abscess. 4. Cirrhotic-appearing liver. 5. Largest abscess collections within the medial aspect of the LEFT thigh are no longer seen status post interval incision and drainage. Expected postsurgical changes of the overlying soft tissues. No evidence of surgical complicating feature. 6. Stable smaller abscess collection anterior to the LEFT femur, measuring 3.3 cm greatest dimension. 7. Osseous femur appears intact and normal in mineralization. No evidence of associated osteomyelitis. Electronically Signed   By: Franki Cabot M.D.   On: 09/21/2020 17:42        Scheduled Meds: . (feeding supplement) PROSource Plus  30 mL Oral BID BM  . sodium chloride   Intravenous Once  . carvedilol  6.25 mg Oral BID WC  . Chlorhexidine Gluconate Cloth  6 each Topical Daily  . docusate sodium  100 mg Oral BID  . feeding supplement  1 Container Oral BID BM  . folic acid  1 mg Oral Daily  . lactulose  10 g Oral BID  . mouth rinse  15 mL Mouth Rinse BID  . metroNIDAZOLE  500 mg Oral Q8H  . multivitamin with minerals  1 tablet Oral Daily  . nutrition supplement (JUVEN)  1 packet Oral BID BM  . pantoprazole  40 mg Oral BID  . sertraline  25 mg Oral Daily  . sodium chloride flush  5 mL Intracatheter Q8H  . thiamine  100 mg Oral Daily   Continuous Infusions: .  ceFAZolin (ANCEF) IV Stopped (09/22/20 0554)     LOS: 9 days   Time spent= 35 mins  Dede Query, RN NP-Student   If 7PM-7AM, please contact night-coverage  09/22/2020, 11:41 AM

## 2020-09-22 NOTE — Op Note (Signed)
Preop diagnosis: Left groin abscess  Postop diagnosis: Same  Procedure: Incision and drainage of left groin abscess and change of wound VAC to left thigh  Surgeon: Erroll Luna, MD  Anesthesia: General  EBL: Minimal  Drains: None  Indications for procedure: The patient presents for a drainage of a left groin abscess.  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  He presents today to have his left groin abscess drained.  Of note it  is adjacent to his wound VAC and this will have to be removed in the case.  I spoke with orthopedic  surgery and asked that I replace it when I am done which I agreed to do.  I discussed this with the patient.  Risks and benefits discussed the patient agreed to proceed.The procedure has been discussed with the patient.  Alternative therapies have been discussed with the patient.  Operative risks include bleeding,  Infection,  Organ injury,  Nerve injury,  Blood vessel injury,  DVT,  Pulmonary embolism,  Death,  And possible reoperation.  Medical management risks include worsening of present situation.  The success of the procedure is 50 -90 % at treating patients symptoms.  The patient understands and agrees to proceed.   Description of procedure: The patient was met in the holding area and left groin was marked.  He was then taken back to the operating.  He is placed supine upon the OR table.  After induction of general esthesia, left groin was prepped and draped in sterile fashion and timeout performed.  Of note wound VAC was removed to facilitate better exposure of the area.  The abscess was located left crease between his leg and left groin.  Incision was made.  Copious amount of pus  Was drained from the area.  This tracked medially about 3 cm and then laterally 3 cm.  This was washed out with copious irrigation.  Was quite oozy secondary to his cirrhosis and hypocoagulable state.  After  irrigation,  cautery was used to control bleeding.  Hemoblast was placed as well given the amount of oozing.  After 3 minutes of pressure hemostasis was achieved after application of hemoblast.  We then packed it with saline soaked Kerlix.  Wound VAC were placed without difficulty.  Have note the wound was clean.  There is no  foul smell.   After  Replacement of the  Dressings,  he was awoke extubated taken recovery in satisfactory condition.

## 2020-09-22 NOTE — Progress Notes (Signed)
PT Cancellation Note  Patient Details Name: NIRVAAN FRETT MRN: [id number] DOB: 11/13/1985   Cancelled Treatment:    Reason Eval/Treat Not Completed: Patient at procedure or test/unavailable (Patient returned to OR today for second I&D of Lt hip/groin. Will follow up at later date/time as schedule allows and pt able to participate.)  Wynn Maudlin, DPT Acute Rehabilitation Services Office [phone] Pager [phone]    [name] 09/22/2020, 2:44 PM

## 2020-09-23 ENCOUNTER — Encounter (HOSPITAL_COMMUNITY): Payer: Self-pay | Admitting: Surgery

## 2020-09-23 ENCOUNTER — Inpatient Hospital Stay (HOSPITAL_COMMUNITY): Payer: 59

## 2020-09-23 DIAGNOSIS — T8149XA Infection following a procedure, other surgical site, initial encounter: Secondary | ICD-10-CM | POA: Diagnosis not present

## 2020-09-23 DIAGNOSIS — D649 Anemia, unspecified: Secondary | ICD-10-CM | POA: Diagnosis not present

## 2020-09-23 DIAGNOSIS — L0889 Other specified local infections of the skin and subcutaneous tissue: Secondary | ICD-10-CM

## 2020-09-23 DIAGNOSIS — R7881 Bacteremia: Secondary | ICD-10-CM | POA: Diagnosis not present

## 2020-09-23 DIAGNOSIS — B9561 Methicillin susceptible Staphylococcus aureus infection as the cause of diseases classified elsewhere: Secondary | ICD-10-CM | POA: Diagnosis not present

## 2020-09-23 LAB — COMPREHENSIVE METABOLIC PANEL
ALT: 9 U/L (ref 0–44)
AST: 31 U/L (ref 15–41)
Albumin: 1.9 g/dL — ABNORMAL LOW (ref 3.5–5.0)
Alkaline Phosphatase: 211 U/L — ABNORMAL HIGH (ref 38–126)
Anion gap: 8 (ref 5–15)
BUN: 11 mg/dL (ref 6–20)
CO2: 23 mmol/L (ref 22–32)
Calcium: 7.5 mg/dL — ABNORMAL LOW (ref 8.9–10.3)
Chloride: 96 mmol/L — ABNORMAL LOW (ref 98–111)
Creatinine, Ser: 0.44 mg/dL — ABNORMAL LOW (ref 0.61–1.24)
GFR, Estimated: >60 mL/min (ref >60)
Glucose, Bld: 142 mg/dL — ABNORMAL HIGH (ref 70–99)
Potassium: 5 mmol/L (ref 3.5–5.1)
Sodium: 127 mmol/L — ABNORMAL LOW (ref 135–145)
Total Bilirubin: 1 mg/dL (ref 0.3–1.2)
Total Protein: 5.2 g/dL — ABNORMAL LOW (ref 6.5–8.1)

## 2020-09-23 LAB — CBC
HCT: 22.7 % — ABNORMAL LOW (ref 39.0–52.0)
Hemoglobin: 7.4 g/dL — ABNORMAL LOW (ref 13.0–17.0)
MCH: 32.7 pg (ref 26.0–34.0)
MCHC: 32.6 g/dL (ref 30.0–36.0)
MCV: 100.4 fL — ABNORMAL HIGH (ref 80.0–100.0)
Platelets: 141 10*3/uL — ABNORMAL LOW (ref 150–400)
RBC: 2.26 MIL/uL — ABNORMAL LOW (ref 4.22–5.81)
RDW: 17.2 % — ABNORMAL HIGH (ref 11.5–15.5)
WBC: 14.7 10*3/uL — ABNORMAL HIGH (ref 4.0–10.5)
nRBC: 0 % (ref 0.0–0.2)

## 2020-09-23 MED ORDER — SODIUM CHLORIDE 0.9% FLUSH
5.0000 mL | Freq: Three times a day (TID) | INTRAVENOUS | Status: DC
  Administered 2020-09-23 – 2020-10-10 (×49): 5 mL

## 2020-09-23 MED ORDER — MIDAZOLAM HCL 2 MG/2ML IJ SOLN
INTRAMUSCULAR | Status: AC
  Filled 2020-09-23: qty 4

## 2020-09-23 MED ORDER — OXYCODONE HCL 5 MG PO TABS
5.0000 mg | ORAL_TABLET | ORAL | Status: DC | PRN
  Administered 2020-09-23 – 2020-09-26 (×14): 5 mg via ORAL
  Filled 2020-09-23 (×14): qty 1

## 2020-09-23 MED ORDER — FENTANYL CITRATE (PF) 100 MCG/2ML IJ SOLN
INTRAMUSCULAR | Status: AC
  Filled 2020-09-23: qty 2

## 2020-09-23 MED ORDER — HYDROMORPHONE HCL 1 MG/ML IJ SOLN
1.0000 mg | INTRAMUSCULAR | Status: DC | PRN
  Administered 2020-09-24 – 2020-10-05 (×19): 1 mg via INTRAVENOUS
  Filled 2020-09-23 (×23): qty 1

## 2020-09-23 MED ORDER — IOHEXOL 300 MG/ML  SOLN
75.0000 mL | Freq: Once | INTRAMUSCULAR | Status: AC | PRN
  Administered 2020-09-23: 75 mL via INTRAVENOUS

## 2020-09-23 MED ORDER — FENTANYL CITRATE (PF) 100 MCG/2ML IJ SOLN
INTRAMUSCULAR | Status: AC | PRN
  Administered 2020-09-23 – 2020-09-27 (×3): 50 ug via INTRAVENOUS
  Administered 2020-09-27 (×2): 25 ug via INTRAVENOUS

## 2020-09-23 MED ORDER — MIDAZOLAM HCL 2 MG/2ML IJ SOLN
INTRAMUSCULAR | Status: AC | PRN
  Administered 2020-09-23 (×4): 1 mg via INTRAVENOUS

## 2020-09-23 MED FILL — Vancomycin HCl For IV Soln 1 GM (Base Equivalent): INTRAVENOUS | Qty: 4000 | Status: AC

## 2020-09-23 NOTE — Progress Notes (Signed)
PT Cancellation Note  Patient Details Name: [name] MRN: [id number] DOB: [birth year]   Cancelled Treatment:    Reason Eval/Treat Not Completed: Patient at procedure or test/unavailable. Going to IR. Attempt again as schedule permits   [facility] 09/23/2020, 2:04 PM

## 2020-09-23 NOTE — Progress Notes (Signed)
[facility]  PROGRESS NOTE  [name]:[id number] DOB: 06-05-1986 DOA: 09/13/2020 PCP: [name], PA   Brief HPI:   35 year old male with past medical history of AVN of the hip, hypertension, GERD he was recently admitted on 08/22/2020 for COVID-19 and was discharged on 08/29/2020.  2 days after discharge patient began to feel extremely weak and tired with symptoms progressing over next 2 weeks.  Patient developed left thigh rash and leg pain with swelling and tenderness to touch.  Patient was seen at PCP office and was told to go to the ED immediately.  He was found to have left iliopsoas and left gluteal abscess with diffuse swelling and left hip effusion on CT scan.  General surgery was consulted.  Blood cultures positive for MSSA bacteremia.  He underwent drainage of the abscess with 3 drain placement by IR in his iliopsoas muscle and multiple abscesses in the legs.  On 09/15/2020 had left hip interposition arthroplasty with antibiotic spacer placement and debridement and developed decompensated septic shock with refractory hypotension despite 2 units PRBC, 2.2 L of crystalloid and 500 cc of albumin.  He was admitted to ICU with vasopressor support.  ID has been consulted and recommend 6 weeks of IV cefazolin. Patient also had scrotal edema anasarca in the setting of IV fluid resuscitation.  Ultrasound was unremarkable for abscess but showed marked asymmetric left scrotal wall thickening and fluid with increased blood flow consistent with soft tissue infection.      Subjective   Patient seen and examined, pain not well controlled with oxycodone.   Assessment/Plan:     1. Left hip septic arthritis/left thigh fluid collection-patient underwent drainage of the abscess with drain placement x3, left retroperitoneum, medial left thigh, lateral left thigh and IR on 09/14/2020.  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  Patient underwent left thigh irrigation and debridement with wound VAC placement on 09/20/2020. General surgery took patient to the OR for left thigh irrigation and debridement with change of wound VAC.  Repeat CT showed persistent left anterior thigh fluid collection concerning for abscess, IR has been reconsulted for image guided aspiration/drainage of the remaining left thigh collection today. 2. Chronic pain-secondary to above, will discontinue oxycodone and start Dilaudid 1 mg IV every 4 hours as needed. 3. MSSA bacteremia-patient would have emesis of bacteremia, likely from complicated iliopsoas/gluteal/thigh abscess.  Infectious disease was consulted.  Patient started on IV cefazolin.  Patient initially underwent TTE which did not show any vegetation.  As it was difficult study so he underwent transesophageal echocardiogram on 09/19/2020, which showed no vegetation.  ID is following, will need prolonged course of antibiotics.  Duration of antibiotics per ID. 4. Symptomatic anemia-patient presented with hemoglobin/hematocrit of 6.2/18.9, he received 4 units PRBC.  Anemia panel showed iron level 44, U IBC 131, TIBC 175, saturation is 25%.  LDH was 308, haptoglobin pending, his hemoglobin was 6.4 yesterday, received another unit of PRBC, hemoglobin is stable at 7.4. 5. History of alcoholic hepatitis/liver cirrhosis-patient has history of alcohol abuse, he was started on prednisolone for alcoholic hepatitis and was being tapered off.  Prednisone was discontinued due to bacteremia.  LFTs are back to normal. 6. Hyponatremia-likely from underlying liver cirrhosis, likely chronic.  We will continue to monitor sodium. 7. Scrotal edema-patient developed scrotal edema, scrotal ultrasound showed no  evidence of testicular mass or torsion, mild asymmetric Scrotal wall  thickening and fluid with increased blood flow.  Likely from soft tissue infection. 8. Thrombocytopenia-platelet count improved to 104,000.     COVID-19 Labs  No results for input(s): DDIMER, FERRITIN, LDH, CRP in the last 72 hours.  Lab Results  Component Value Date   SARSCOV2NAA NEGATIVE 09/13/2020   SARSCOV2NAA POSITIVE (A) 08/22/2020   SARSCOV2NAA DETECTED (A) 02/24/2019     Scheduled medications:   . (feeding supplement) PROSource Plus  30 mL Oral BID BM  . sodium chloride   Intravenous Once  . carvedilol  6.25 mg Oral BID WC  . Chlorhexidine Gluconate Cloth  6 each Topical Daily  . docusate sodium  100 mg Oral BID  . feeding supplement  1 Container Oral BID BM  . folic acid  1 mg Oral Daily  . lactulose  10 g Oral BID  . mouth rinse  15 mL Mouth Rinse BID  . metroNIDAZOLE  500 mg Oral Q8H  . multivitamin with minerals  1 tablet Oral Daily  . nutrition supplement (JUVEN)  1 packet Oral BID BM  . pantoprazole  40 mg Oral BID  . sertraline  25 mg Oral Daily  . sodium chloride flush  5 mL Intracatheter Q8H  . thiamine  100 mg Oral Daily      SpO2: 97 % O2 Flow Rate (L/min): 2 L/min    CBC: Recent Labs  Lab 09/17/20 0511 09/18/20 0303 09/19/20 0305 09/20/20 0244 09/21/20 0234 09/21/20 1117 09/23/20 0939  WBC 12.1* 15.1* 14.5* 10.7* 11.9*  --  14.7*  NEUTROABS 9.6* 12.2* 11.8* 8.3* 9.8*  --   --   HGB 7.5* 9.8* 8.8* 7.7* 6.4* 7.8* 7.4*  HCT 22.5* 30.2* 26.6* 24.0* 20.3* 23.5* 22.7*  MCV 96.6 97.7 98.5 100.0 102.0*  --  100.4*  PLT 72* 86* 85* 99* 104*  --  141*    Basic Metabolic Panel: Recent Labs  Lab 09/17/20 0511 09/18/20 0303 09/19/20 0305 09/20/20 0244 09/21/20 0234 09/23/20 0243  NA 131* 129* 129* 131* 126* 127*  K 4.3 4.4 4.4 4.3 4.8 5.0  CL 102 98 96* 95* 95* 96*  CO2 GLUCOSE 127* 91 100* 96 173* 142*  BUN CREATININE 0.38* 0.44* 0.41* 0.43* 0.34* 0.44*  CALCIUM 7.2* 7.5* 7.4* 7.4* 7.1* 7.5*   MG 2.0 1.9 1.9 1.9 1.7  --   PHOS 1.6* 1.6* 3.4 2.7 3.1  --      Liver Function Tests: Recent Labs  Lab 09/18/20 0303 09/19/20 0305 09/20/20 0244 09/21/20 0234 09/23/20 0243  AST 61* 39 ALT ALKPHOS 270* 244* 240* 214* 211*  BILITOT 1.7* 1.7* 1.6* 1.5* 1.0  PROT 5.7* 5.2* 5.2* 4.8* 5.2*  ALBUMIN 2.3* 2.0* 1.9* 1.7* 1.9*     Antibiotics: Anti-infectives (From admission, onward)   Start     Dose/Rate Route Frequency Ordered Stop   09/18/20 1400  metroNIDAZOLE (FLAGYL) tablet 500 mg        500 mg Oral Every 8 hours 09/18/20 0927     09/15/20 1715  tobramycin (NEBCIN) powder  Status:  Discontinued          As needed 09/15/20 1715 09/15/20 2043   09/15/20 1715  vancomycin (VANCOCIN) powder  Status:  Discontinued          As needed 09/15/20 1716 09/15/20  2043   09/14/20 0600  ceFAZolin (ANCEF) IVPB 2g/100 mL premix        2 g 200 mL/hr over 30 Minutes Intravenous Every 8 hours 09/14/20 0359     09/13/20 2200  vancomycin (VANCOREADY) IVPB 1500 mg/300 mL  Status:  Discontinued        1,500 mg 150 mL/hr over 120 Minutes Intravenous Every 12 hours 09/13/20 1636 09/14/20 0359   09/13/20 1500  ceFEPIme (MAXIPIME) 2 g in sodium chloride 0.9 % 100 mL IVPB  Status:  Discontinued        2 g 200 mL/hr over 30 Minutes Intravenous Every 8 hours 09/13/20 1437 09/14/20 0359   09/13/20 1130  vancomycin (VANCOREADY) IVPB 1750 mg/350 mL        1,750 mg 175 mL/hr over 120 Minutes Intravenous  Once 09/13/20 1107 09/13/20 1414   09/13/20 1045  ceFAZolin (ANCEF) IVPB 1 g/50 mL premix        1 g 100 mL/hr over 30 Minutes Intravenous  Once 09/13/20 1031 09/13/20 1307       DVT prophylaxis: SCDs  Code Status: Full code  Family Communication: Discussed with patient wife at bedside   Consultants:  General surgery  Orthopedics  Infectious disease  Interventional radiology  Procedures:  TOTAL HIP ARTHROPLASTY WITH CEMENT INTERPOSITION SPACING for septic hip  arthritis from septicemia in the setting of alcoholic AVN of the left hip  Drainage of the abscess with 3 drain placement by IR in iliopsoas muscle and multiple abscesses in the legs     Objective   Vitals:   09/23/20 0726 09/23/20 0800 09/23/20 0900 09/23/20 1000  BP: 112/70 114/74 110/66 115/70  Pulse: 93 90 88 92  Resp:  15 20 15   Temp:  97.9 F (36.6 C)    TempSrc:  Oral    SpO2:  95% 95% 97%  Weight:      Height:        Intake/Output Summary (Last 24 hours) at 09/23/2020 1025 Last data filed at 09/23/2020 0643 Gross per 24 hour  Intake 1501.93 ml  Output 1065 ml  Net 436.93 ml    03/02 1901 - 03/04 0700 In: 1688.8 [P.O.:715; I.V.:400] Out: 1980 [Urine:1025; Drains:955]  Filed Weights   09/19/20 0500 09/19/20 0843 09/21/20 0500  Weight: 91.8 kg 97.8 kg 93.9 kg    Physical Examination:    General-appears in no acute distress  Heart-S1-S2, regular, no murmur auscultated  Lungs-clear to auscultation bilaterally, no wheezing or crackles auscultated  Abdomen-soft, mildly distended, nontender to palpation  Extremities-left upper thigh is edematous, wound VAC in place  Neuro-alert, oriented x3, no focal deficit noted   Status is: Inpatient  Dispo: The patient is from: Home              Anticipated d/c is to: Skilled nursing facility              Anticipated d/c date is: 09/29/2020              Patient currently not stable for discharge  Barrier to discharge-ongoing management for iliopsoas abscess       Data Reviewed:   Recent Results (from the past 240 hour(s))  Culture, blood (routine x 2)     Status: Abnormal   Collection Time: 09/13/20 10:45 AM   Specimen: BLOOD  Result Value Ref Range Status   Specimen Description   Final    BLOOD RIGHT ANTECUBITAL Performed at [facility], [address]  [place], [place] [address]    Special Requests   Final    BOTTLES DRAWN AEROBIC AND ANAEROBIC Blood Culture adequate  volume Performed at [facility], [address], [place], [place] [address]    Culture  Setup Time   Final    IN BOTH AEROBIC AND ANAEROBIC BOTTLES GRAM POSITIVE COCCI CRITICAL RESULT CALLED TO, READ BACK BY AND VERIFIED WITHErling Cruz [facility] 09/13/20 0347 JDW Performed at [facility], [address] [place], [place] [address]    Culture STAPHYLOCOCCUS AUREUS (A)  Final   Report Status 09/16/2020 FINAL  Final   Organism ID, Bacteria STAPHYLOCOCCUS AUREUS  Final      Susceptibility   Staphylococcus aureus - MIC*    CIPROFLOXACIN <=0.5 SENSITIVE Sensitive     ERYTHROMYCIN <=0.25 SENSITIVE Sensitive     GENTAMICIN <=0.5 SENSITIVE Sensitive     OXACILLIN <=0.25 SENSITIVE Sensitive     TETRACYCLINE <=1 SENSITIVE Sensitive     VANCOMYCIN <=0.5 SENSITIVE Sensitive     TRIMETH/SULFA <=10 SENSITIVE Sensitive     CLINDAMYCIN <=0.25 SENSITIVE Sensitive     RIFAMPIN <=0.5 SENSITIVE Sensitive     Inducible Clindamycin NEGATIVE Sensitive     * STAPHYLOCOCCUS AUREUS  Blood Culture ID Panel (Reflexed)     Status: Abnormal   Collection Time: 09/13/20 10:45 AM  Result Value Ref Range Status   Enterococcus faecalis NOT DETECTED NOT DETECTED Final   Enterococcus Faecium NOT DETECTED NOT DETECTED Final   Listeria monocytogenes NOT DETECTED NOT DETECTED Final   Staphylococcus species DETECTED (A) NOT DETECTED Final    Comment: CRITICAL RESULT CALLED TO, READ BACK BY AND VERIFIED WITH: E JACKSON PHARMD 09/13/20 0347 JDW    Staphylococcus aureus (BCID) DETECTED (A) NOT DETECTED Final    Comment: CRITICAL RESULT CALLED TO, READ BACK BY AND VERIFIED WITH: E JACKSON PHARMD 09/13/20 0347 JDW    Staphylococcus epidermidis NOT DETECTED NOT DETECTED Final   Staphylococcus lugdunensis NOT DETECTED NOT DETECTED Final   Streptococcus species NOT DETECTED NOT DETECTED Final   Streptococcus agalactiae NOT DETECTED NOT DETECTED Final   Streptococcus pneumoniae NOT DETECTED NOT DETECTED  Final   Streptococcus pyogenes NOT DETECTED NOT DETECTED Final   A.calcoaceticus-baumannii NOT DETECTED NOT DETECTED Final   Bacteroides fragilis NOT DETECTED NOT DETECTED Final   Enterobacterales NOT DETECTED NOT DETECTED Final   Enterobacter cloacae complex NOT DETECTED NOT DETECTED Final   Escherichia coli NOT DETECTED NOT DETECTED Final   Klebsiella aerogenes NOT DETECTED NOT DETECTED Final   Klebsiella oxytoca NOT DETECTED NOT DETECTED Final   Klebsiella pneumoniae NOT DETECTED NOT DETECTED Final   Proteus species NOT DETECTED NOT DETECTED Final   Salmonella species NOT DETECTED NOT DETECTED Final   Serratia marcescens NOT DETECTED NOT DETECTED Final   Haemophilus influenzae NOT DETECTED NOT DETECTED Final   Neisseria meningitidis NOT DETECTED NOT DETECTED Final   Pseudomonas aeruginosa NOT DETECTED NOT DETECTED Final   Stenotrophomonas maltophilia NOT DETECTED NOT DETECTED Final   Candida albicans NOT DETECTED NOT DETECTED Final   Candida auris NOT DETECTED NOT DETECTED Final   Candida glabrata NOT DETECTED NOT DETECTED Final   Candida krusei NOT DETECTED NOT DETECTED Final   Candida parapsilosis NOT DETECTED NOT DETECTED Final   Candida tropicalis NOT DETECTED NOT DETECTED Final   Cryptococcus neoformans/gattii NOT DETECTED NOT DETECTED Final   Meth resistant mecA/C and MREJ NOT DETECTED NOT DETECTED Final    Comment: Performed at [facility], [address] [place], [place] [address]  Culture, blood (routine x 2)     Status: Abnormal   Collection Time: 09/13/20 11:20 AM   Specimen: BLOOD  Result Value Ref Range Status   Specimen Description   Final    BLOOD RIGHT ANTECUBITAL Performed at [facility] [address], [place], [place] [address]    Special Requests   Final    BOTTLES DRAWN AEROBIC AND ANAEROBIC Blood Culture adequate volume Performed at [facility], [address], [place], [place] [address]    Culture  Setup  Time   Final    IN BOTH AEROBIC AND ANAEROBIC BOTTLES GRAM POSITIVE COCCI CRITICAL VALUE NOTED.  VALUE IS CONSISTENT WITH PREVIOUSLY REPORTED AND CALLED VALUE.    Culture (A)  Final    STAPHYLOCOCCUS AUREUS SUSCEPTIBILITIES PERFORMED ON PREVIOUS CULTURE WITHIN THE LAST 5 DAYS. Performed at [facility], [address], [place], [place] [address]    Report Status 09/16/2020 FINAL  Final  Resp Panel by RT-PCR (Flu A&B, Covid) Nasopharyngeal Swab     Status: None   Collection Time: 09/13/20  2:01 PM   Specimen: Nasopharyngeal Swab; Nasopharyngeal(NP) swabs in vial transport medium  Result Value Ref Range Status   SARS Coronavirus 2 by RT PCR NEGATIVE NEGATIVE Final    Comment: (NOTE) SARS-CoV-2 target nucleic acids are NOT DETECTED.  The SARS-CoV-2 RNA is generally detectable in upper respiratory specimens during the acute phase of infection. The lowest concentration of SARS-CoV-2 viral copies this assay can detect is 138 copies/mL. A negative result does not preclude SARS-Cov-2 infection and should not be used as the sole basis for treatment or other patient management decisions. A negative result may occur with  improper specimen collection/handling, submission of specimen other than nasopharyngeal swab, presence of viral mutation(s) within the areas targeted by this assay, and inadequate number of viral copies(<138 copies/mL). A negative result must be combined with clinical observations, patient history, and epidemiological information. The expected result is Negative.  Fact Sheet for Patients:  BloggerCourse.com  Fact Sheet for Healthcare Providers:  SeriousBroker.it  This test is no t yet approved or cleared by the [place] FDA and  has been authorized for detection and/or diagnosis of SARS-CoV-2 by FDA under an Emergency Use Authorization (EUA). This EUA will remain  in effect (meaning this test can be used) for  the duration of the COVID-19 declaration under Section 564(b)(1) of the Act, 21 U.S.C.section 360bbb-3(b)(1), unless the authorization is terminated  or revoked sooner.       Influenza A by PCR NEGATIVE NEGATIVE Final   Influenza B by PCR NEGATIVE NEGATIVE Final    Comment: (NOTE) The Xpert Xpress SARS-CoV-2/FLU/RSV plus assay is intended as an aid in the diagnosis of influenza from Nasopharyngeal swab specimens and should not be used as a sole basis for treatment. Nasal washings and aspirates are unacceptable for Xpert Xpress SARS-CoV-2/FLU/RSV testing.  Fact Sheet for Patients: BloggerCourse.com  Fact Sheet for Healthcare Providers: SeriousBroker.it  This test is not yet approved or cleared by the [place] FDA and has been authorized for detection and/or diagnosis of SARS-CoV-2 by FDA under an Emergency Use Authorization (EUA). This EUA will remain in effect (meaning this test can be used) for the duration of the COVID-19 declaration under Section 564(b)(1) of the Act, 21 U.S.C. section 360bbb-3(b)(1), unless the authorization is terminated or revoked.  Performed at [facility], [address], [place], [place] [address]  Culture, blood (routine x 2)     Status: None   Collection Time: 09/14/20  9:23 AM   Specimen: BLOOD RIGHT HAND  Result Value Ref Range Status   Specimen Description   Final    BLOOD RIGHT HAND Performed at [facility], [address] [place], [place] [address]    Special Requests   Final    BOTTLES DRAWN AEROBIC AND ANAEROBIC Blood Culture adequate volume Performed at [facility], [address], [place], [place] [address]    Culture   Final    NO GROWTH 5 DAYS Performed at [facility], [address] [place], [place] [address]    Report Status 09/19/2020 FINAL  Final  Culture, blood (routine x 2)     Status: None    Collection Time: 09/14/20  9:24 AM   Specimen: BLOOD LEFT HAND  Result Value Ref Range Status   Specimen Description   Final    BLOOD LEFT HAND Performed at [facility], [address], [place], [place] [address]    Special Requests   Final    BOTTLES DRAWN AEROBIC ONLY Blood Culture adequate volume Performed at [facility], [address] [place], [place] [address]    Culture   Final    NO GROWTH 5 DAYS Performed at [facility], [address], [place], [place] [address]    Report Status 09/19/2020 FINAL  Final  Aerobic/Anaerobic Culture (surgical/deep wound)     Status: None   Collection Time: 09/14/20 12:37 PM   Specimen: Abscess  Result Value Ref Range Status   Specimen Description   Final    ABSCESS  LEFT RP Performed at [facility], [address] [place], [place] [address]    Special Requests   Final    Normal Performed at [facility], [address] [place], [place] [address]    Gram Stain   Final    MODERATE WBC PRESENT, PREDOMINANTLY PMN ABUNDANT GRAM POSITIVE COCCI ABUNDANT GRAM NEGATIVE RODS    Culture   Final    ABUNDANT STAPHYLOCOCCUS AUREUS MODERATE BACTEROIDES ORALIS FEW PREVOTELLA BIVIA BETA LACTAMASE POSITIVE Performed at [facility], [address] [place], [place] [address]    Report Status 09/19/2020 FINAL  Final   Organism ID, Bacteria STAPHYLOCOCCUS AUREUS  Final      Susceptibility   Staphylococcus aureus - MIC*    CIPROFLOXACIN <=0.5 SENSITIVE Sensitive     ERYTHROMYCIN <=0.25 SENSITIVE Sensitive     GENTAMICIN <=0.5 SENSITIVE Sensitive     OXACILLIN <=0.25 SENSITIVE Sensitive     TETRACYCLINE <=1 SENSITIVE Sensitive     VANCOMYCIN <=0.5 SENSITIVE Sensitive     TRIMETH/SULFA <=10 SENSITIVE Sensitive     CLINDAMYCIN <=0.25 SENSITIVE Sensitive     RIFAMPIN <=0.5 SENSITIVE Sensitive     Inducible Clindamycin NEGATIVE Sensitive     * ABUNDANT  STAPHYLOCOCCUS AUREUS  MRSA PCR Screening     Status: None   Collection Time: 09/15/20  3:22 AM   Specimen: Nasal Mucosa; Nasopharyngeal  Result Value Ref Range Status   MRSA by PCR NEGATIVE NEGATIVE Final    Comment:        The GeneXpert MRSA Assay (FDA approved for NASAL specimens only), is one component of a comprehensive MRSA colonization surveillance program. It is not intended to diagnose MRSA infection nor to guide or monitor treatment for MRSA infections. Performed at [facility], [address] [place], [place] [address]     No results for input(s): LIPASE, AMYLASE in the last 168 hours. No results for input(s): AMMONIA in the last 168 hours.  Cardiac Enzymes: No results for input(s): CKTOTAL, CKMB, CKMBINDEX, TROPONINI in the last 168 hours. BNP (last 3 results) No results for input(s): BNP in the last 8760 hours.  ProBNP (last 3 results) No results for input(s): PROBNP in the last 8760 hours.  Studies:  CT ABDOMEN PELVIS W CONTRAST  Result Date: 09/21/2020 CLINICAL DATA:  Abdominal abscess/infection suspected. Status post orthopedic surgery on 09/15/2020. LEFT thigh incision and drainage on 09/20/2020. EXAM: CT ABDOMEN AND PELVIS WITH CONTRAST CT LEFT FEMUR WITH CONTRAST TECHNIQUE: Multidetector CT imaging of the abdomen and pelvis was performed using the standard protocol following bolus administration of intravenous contrast. CONTRAST:  OMNIPAQUE IOHEXOL 300 MG/ML  SOLN COMPARISON:  CT abdomen pelvis dated 09/18/2020. CT LEFT femur dated 09/18/2020. FINDINGS: CT abdomen AND PELVIS FINDINGS: Lower chest: Mild bibasilar atelectasis. Hepatobiliary: Cirrhotic-appearing liver. No focal mass or lesion is seen within the liver. Gallbladder is unremarkable. No bile duct dilatation. Pancreas: Unremarkable. No pancreatic ductal dilatation or surrounding inflammatory changes. Spleen: Perhaps mild splenomegaly.  Otherwise unremarkable. Adrenals/Urinary  Tract: Adrenal glands appear normal. Kidneys are unremarkable without mass, stone or hydronephrosis. No ureteral or bladder calculi are identified. Bladder is decompressed by Foley catheter. Stomach/Bowel: No dilated large or small bowel loops. Stomach is unremarkable. Vascular/Lymphatic: No acute appearing vascular abnormality. No enlarged lymph nodes are seen. Reproductive: Prostate gland is unremarkable. Other: Moderate to large ascites, similar to the previous CT of 09/18/2020. No new fluid collection or abscess. No free intraperitoneal air. Musculoskeletal: No acute appearing osseous abnormality. Stable position of the LEFT inguinal approach surgical drain with pigtail terminating at the site of a previously demonstrated fluid collection in the LEFT lower psoas muscle. This collection has resolved in the interval. The additional complex fluid collection within the iliacus muscle is also significantly improved and nearly resolved, perhaps a small residual component overlying the LEFT SI joint (series 5, image 81). Persistent anasarca without significant change. CT LEFT FEMUR FINDINGS: Interval surgical evacuation of the abscess collection in the medial LEFT thigh. No persistent fluid collection is seen, with expected postsurgical changes of the overlying soft tissues and with associated packing in place at the overlying skin defect. Grossly stable abscess collection anterior to the upper LEFT femur, measuring 3.3 cm (series 11, image 150). The larger fluid collection seen at this level on the previous study is no longer identified status post the interval incision and drainage. The drainage catheters have been removed from the upper thigh. No new fluid collection or abscess-like collection is identified within the soft tissues about the LEFT femur. Soft tissues about the LEFT knee are unremarkable. No hemorrhage or contrast extravasation is seen within the soft tissues about the LEFT femur. Osseous femur itself  appears intact and normal in mineralization. No cortical defect, lucency or destructive change to suggest associated osteomyelitis. IMPRESSION: 1. Stable position of the LEFT inguinal approach surgical drain with pigtail terminating at the site of a previously demonstrated fluid collection in the LEFT lower psoas muscle. This psoas muscle collection has resolved in the interval. 2. The additional complex fluid collection within the iliacus muscle is also significantly improved and nearly resolved, perhaps a small residual component overlying the LEFT SI joint. 3. Moderate to large ascites, similar to the previous CT of 09/18/2020. No new fluid collection or  abscess. 4. Cirrhotic-appearing liver. 5. Largest abscess collections within the medial aspect of the LEFT thigh are no longer seen status post interval incision and drainage. Expected postsurgical changes of the overlying soft tissues. No evidence of surgical complicating feature. 6. Stable smaller abscess collection anterior to the LEFT femur, measuring 3.3 cm greatest dimension. 7. Osseous femur appears intact and normal in mineralization. No evidence of associated osteomyelitis. Electronically Signed   By: Bary Richard M.D.   On: 09/21/2020 17:42   CT FEMUR LEFT W CONTRAST  Result Date: 09/21/2020 CLINICAL DATA:  Abdominal abscess/infection suspected. Status post orthopedic surgery on 09/15/2020. LEFT thigh incision and drainage on 09/20/2020. EXAM: CT ABDOMEN AND PELVIS WITH CONTRAST CT LEFT FEMUR WITH CONTRAST TECHNIQUE: Multidetector CT imaging of the abdomen and pelvis was performed using the standard protocol following bolus administration of intravenous contrast. CONTRAST:  OMNIPAQUE IOHEXOL 300 MG/ML  SOLN COMPARISON:  CT abdomen pelvis dated 09/18/2020. CT LEFT femur dated 09/18/2020. FINDINGS: CT abdomen AND PELVIS FINDINGS: Lower chest: Mild bibasilar atelectasis. Hepatobiliary: Cirrhotic-appearing liver. No focal mass or lesion is seen  within the liver. Gallbladder is unremarkable. No bile duct dilatation. Pancreas: Unremarkable. No pancreatic ductal dilatation or surrounding inflammatory changes. Spleen: Perhaps mild splenomegaly.  Otherwise unremarkable. Adrenals/Urinary Tract: Adrenal glands appear normal. Kidneys are unremarkable without mass, stone or hydronephrosis. No ureteral or bladder calculi are identified. Bladder is decompressed by Foley catheter. Stomach/Bowel: No dilated large or small bowel loops. Stomach is unremarkable. Vascular/Lymphatic: No acute appearing vascular abnormality. No enlarged lymph nodes are seen. Reproductive: Prostate gland is unremarkable. Other: Moderate to large ascites, similar to the previous CT of 09/18/2020. No new fluid collection or abscess. No free intraperitoneal air. Musculoskeletal: No acute appearing osseous abnormality. Stable position of the LEFT inguinal approach surgical drain with pigtail terminating at the site of a previously demonstrated fluid collection in the LEFT lower psoas muscle. This collection has resolved in the interval. The additional complex fluid collection within the iliacus muscle is also significantly improved and nearly resolved, perhaps a small residual component overlying the LEFT SI joint (series 5, image 81). Persistent anasarca without significant change. CT LEFT FEMUR FINDINGS: Interval surgical evacuation of the abscess collection in the medial LEFT thigh. No persistent fluid collection is seen, with expected postsurgical changes of the overlying soft tissues and with associated packing in place at the overlying skin defect. Grossly stable abscess collection anterior to the upper LEFT femur, measuring 3.3 cm (series 11, image 150). The larger fluid collection seen at this level on the previous study is no longer identified status post the interval incision and drainage. The drainage catheters have been removed from the upper thigh. No new fluid collection or  abscess-like collection is identified within the soft tissues about the LEFT femur. Soft tissues about the LEFT knee are unremarkable. No hemorrhage or contrast extravasation is seen within the soft tissues about the LEFT femur. Osseous femur itself appears intact and normal in mineralization. No cortical defect, lucency or destructive change to suggest associated osteomyelitis. IMPRESSION: 1. Stable position of the LEFT inguinal approach surgical drain with pigtail terminating at the site of a previously demonstrated fluid collection in the LEFT lower psoas muscle. This psoas muscle collection has resolved in the interval. 2. The additional complex fluid collection within the iliacus muscle is also significantly improved and nearly resolved, perhaps a small residual component overlying the LEFT SI joint. 3. Moderate to large ascites, similar to the previous CT of 09/18/2020. No new  fluid collection or abscess. 4. Cirrhotic-appearing liver. 5. Largest abscess collections within the medial aspect of the LEFT thigh are no longer seen status post interval incision and drainage. Expected postsurgical changes of the overlying soft tissues. No evidence of surgical complicating feature. 6. Stable smaller abscess collection anterior to the LEFT femur, measuring 3.3 cm greatest dimension. 7. Osseous femur appears intact and normal in mineralization. No evidence of associated osteomyelitis. Electronically Signed   By: Bary RichardStan  Maynard M.D.   On: 09/21/2020 17:42       Harmonie Verrastro S Mikias Lanz   [facility] If 7PM-7AM, please contact night-coverage at [email or web address], Office  [phone]   09/23/2020, 10:25 AM  LOS: 10 days

## 2020-09-23 NOTE — Progress Notes (Signed)
[facility]   Reason for visit: Follow up on disseminated MSSA infection  Interval History: no acute events.  WBC 14.7.  Remains afebrile.  Day 11 cefazolin Day 6 metronidazole   Physical Exam: Constitutional:  Vitals:   09/23/20 1510 09/23/20 1515  BP: 120/67 (!) 122/58  Pulse: 86 84  Resp: 14 14  Temp:    SpO2:  100%  Patient currently in IR and not available  Review of Systems: Constitutional: no fever Integument/breast: no rash  Lab Results  Component Value Date   WBC 14.7 (H) 09/23/2020   HGB 7.4 (L) 09/23/2020   HCT 22.7 (L) 09/23/2020   MCV 100.4 (H) 09/23/2020   PLT 141 (L) 09/23/2020    Lab Results  Component Value Date   CREATININE 0.44 (L) 09/23/2020   BUN 11 09/23/2020   NA 127 (L) 09/23/2020   K 5.0 09/23/2020   CL 96 (L) 09/23/2020   CO2 23 09/23/2020    Lab Results  Component Value Date   ALT 9 09/23/2020   AST 31 09/23/2020   ALKPHOS 211 (H) 09/23/2020     Microbiology: Recent Results (from the past 240 hour(s))  Culture, blood (routine x 2)     Status: None   Collection Time: 09/14/20  9:23 AM   Specimen: BLOOD RIGHT HAND  Result Value Ref Range Status   Specimen Description   Final    BLOOD RIGHT HAND Performed at [facility], [address], [place], [place] [address]    Special Requests   Final    BOTTLES DRAWN AEROBIC AND ANAEROBIC Blood Culture adequate volume Performed at [facility], [address], [place], [place] [address]    Culture   Final    NO GROWTH 5 DAYS Performed at [facility], [address], [place], [place] [address]    Report Status 09/19/2020 FINAL  Final  Culture, blood (routine x 2)     Status: None   Collection Time: 09/14/20  9:24 AM   Specimen: BLOOD LEFT HAND  Result Value Ref Range Status   Specimen Description   Final    BLOOD LEFT HAND Performed at [facility], [address] [place], [place]  [address]    Special Requests   Final    BOTTLES DRAWN AEROBIC ONLY Blood Culture adequate volume Performed at [facility], [address], [place], [place] [address]    Culture   Final    NO GROWTH 5 DAYS Performed at [facility], [address] [place], [place] [address]    Report Status 09/19/2020 FINAL  Final  Aerobic/Anaerobic Culture (surgical/deep wound)     Status: None   Collection Time: 09/14/20 12:37 PM   Specimen: Abscess  Result Value Ref Range Status   Specimen Description   Final    ABSCESS  LEFT RP Performed at [facility], [address] [place], [place] [address]    Special Requests   Final    Normal Performed at [facility], [address], [place], [place] [address]    Gram Stain   Final    MODERATE WBC PRESENT, PREDOMINANTLY PMN ABUNDANT GRAM POSITIVE COCCI ABUNDANT GRAM NEGATIVE RODS    Culture   Final    ABUNDANT STAPHYLOCOCCUS AUREUS MODERATE BACTEROIDES ORALIS FEW PREVOTELLA BIVIA BETA LACTAMASE POSITIVE Performed at [facility], [address], [place], [place] [address]  Report Status 09/19/2020 FINAL  Final   Organism ID, Bacteria STAPHYLOCOCCUS AUREUS  Final      Susceptibility   Staphylococcus aureus - MIC*    CIPROFLOXACIN <=0.5 SENSITIVE Sensitive     ERYTHROMYCIN <=0.25 SENSITIVE Sensitive     GENTAMICIN <=0.5 SENSITIVE Sensitive     OXACILLIN <=0.25 SENSITIVE Sensitive     TETRACYCLINE <=1 SENSITIVE Sensitive     VANCOMYCIN <=0.5 SENSITIVE Sensitive     TRIMETH/SULFA <=10 SENSITIVE Sensitive     CLINDAMYCIN <=0.25 SENSITIVE Sensitive     RIFAMPIN <=0.5 SENSITIVE Sensitive     Inducible Clindamycin NEGATIVE Sensitive     * ABUNDANT STAPHYLOCOCCUS AUREUS  MRSA PCR Screening     Status: None   Collection Time: 09/15/20  3:22 AM   Specimen: Nasal Mucosa; Nasopharyngeal  Result Value Ref Range Status   MRSA by PCR NEGATIVE NEGATIVE Final    Comment:        The  GeneXpert MRSA Assay (FDA approved for NASAL specimens only), is one component of a comprehensive MRSA colonization surveillance program. It is not intended to diagnose MRSA infection nor to guide or monitor treatment for MRSA infections. Performed at [facility], [address] [place], [place] [address]     Impression/Plan:  1.  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   2. Hip and thigh infection - more drainage today by IR.  Will continue as above and will consider adding rifampin Monday.  Will follow up again on Monday

## 2020-09-23 NOTE — Progress Notes (Signed)
PROGRESS NOTE    [name]  OXB:[id number] DOB: [birth year] DOA: 09/13/2020 PCP: [name], PA   Brief Narrative:   Patient is a 35 yo male with past medical historysignificant for AVN of the hip, HTN, and GERD. Patient was recently admitted on 08/22/20 for COVID-19 and was discharge on 08/29/20. About two days post discharge, patient began to feel extremely weak and tired with symptoms progressively worsening over the next two weeks. During this time. He also developed left leg thigh and legs pain along with swelling and tenderness to touch. He also developed fluid filled blisters and skin sloughing on the medial portion of his left thigh extending into his left groin. Symptoms escalated in intensity until his was unable to walk without significant pain. Patient saw his PCP on 09/12/20 for a lab draw and was called by his PCP office to go to the ED immediately.  ED Course: Hgb 6.2 with 2U PRBC's given, FOBT negative. CT abdomen showedleft iliopsoas and left gluteal abscesses w/ diffuse swelling and left hip effusion. CCS was consulted. Patient was started on cefazolin and vanc.  Interim History: Blood cultures positive for MSSA bacteremia with ID consulted, repeat blood cultures, and transesophageal echo obtained. He underwent 3 drain placement by IRfor abscessesinhis iliopsoas muscle and multiple abscesses in the legs.  On 09/15/20, he had a lefthip Interpositionarthoplastywithantibiotic spacer placement and debridementand developeddecompensated septic shockwith refractoryhypotension despite 2 unit PRBCs, 2.2 L of crystalloid and 500 mL of albumin.He was admitted to the ICU with vasopressor support. He is now hemodynamically stableon TRH service.   Current plan is for left groin I/D on 09/22/20 by Dr. Brantley Stage and repeat CT scan on 3/3 with planned drain placement on 09/23/20.  Assessment & Plan:   Principal Problem:   MSSA bacteremia Active Problems:   Alcohol  dependence ([place])   Alcoholic hepatitis   Sepsis ([place])   Septic arthritis ([place])   Iliopsoas abscess on left [facility])  #MSSA bacteremia/Left hip septic arthritis with iliopsoas, left gluteal, and left medial thigh abscess/Left hip effusion and septic arthritis in the setting of avascular necrosis -Initially started onIV vanc,cefepime, but changed to IV Cefazolin by ID -Initial Blood Cx 09/13/20 showed Staph Aureus in both sets of Cx's, transthoracic echo negative for vegetation, normal valvular and systolic function -Sepsis Criteria met with: tachycardia, tachypnea, lactic acid 3.3, and Leukocytosis was 10.6 source: abscesses as above as well as bacteremia and went into septic shock this admission. -Repeat blood cultures negative, continue course of cefazolin per ID. -2/28/22TEEshowed normal valves and no evidence of vegetation. - Wound cultures positive for S. Aureus, bacteroides, Prevotella - WBC's trending down to 11.9 from max of 15.1, afebrile - Continues to have pain but less severe, on day 5 metridazole, day 10 cefazolin - ID considering possible PICC placement given prolonged 6 week course of IV cefazolin -2/22 CT Abdomen shows interval development of innumerable fluid collections are seen within the musculature of the left ileopsoas muscle as well as the musculature of the visualized portion of the left gluteal region and proximal left thigh consistent with innumerable abscesses -2/24left hip total arthroplasty with cement interposition spacer  -3/1left thigh irrigation and debridement with wound vac placement-post op day 3 -3/3  left groin I/D  -3/4 image guided aspiration/drainage of remaining left thigh collection.   -Wound care orders to start on 3/5 per surgery.  Scrotal edema/anasarca in setting of IVF resuscitation, MSSA bacteremia, sepsis, and hepatic cirrhosis -Scrotal edema, and left groin and leg swelling  persists -Scrotal U/SMarked asymmetric left scrotal wall  thickening and fluid, with increased blood flow.Which is consistent with soft tissue infection. -IVF stopped with IV antibiotics as above and supportive measures such as extremity and scrotal elevation  Symptommatic anemia/macrocytic anemia -On admission H/H 6.2/18.9 s/p6U PRBC's total this admission, currently 7.4/22.7,asymptomatic,FOBC negative -Anemia panel: iron level of 44, UIBC 131, TIBC 175, saturation ratios of 25% -Approximately 400 ml sanguinous drainage from wound vac O/N -Transfused 1U on 3/2/22for Hgb < 7, trend H/Hpost transfusion  Sepsis/Metabolic Acidosis/Electrolyte imbalances (Hyponatremia and Hypophosphatemia) -Sepsis and metabolic acidosis resolved, anion gap closed, d/c IVF due to anasarca -Patient's phosphorus level is2.1, following repletion on 3/1 withIV Sodium Phos 20 mmol -Sodium is now127, down from 129 yesterday, replete withSodium Phos IV, and further diurese with Lasix  Hx ofAlcoholism/Alcoholic Hepatitis/HepaticCirrhosis,Hyperbilirubinemia/Mildly abnormal AST -2/22 CT Abdomen consistent with hepatic cirrhosis -Initially placed on prednisolone,58m through 09/26/20, followed by a taper. Per GI givendisseminated MSSA bacteremia taper discontinued -Per patient drinks 12-18 beers weekly -Liver panel: total Bili 1.5improved from peak of 3.2, albumin 1.7, alk phos 214, AST 46->30,ammonia 45 -Continue lactulose 10 g p.o. twice daily, thiamine, magnesium -Patient and family report he has been alcohol free for 1 month, counsel oncontinuedcessation  Difficulty voiding rt fluid volume overload -Patient current has Foley and was diuresed with 40 mg IV Lasix over the past several days -Voiding trial to d/c Foley  Tachycardia, hypertension, currently sinus tachy rate 100-110 -Continue Carvedilol 3.125 mg p.o. twice daily  Pain control -Oxycodoneprn, discontinued, now on Dilaudid 1 mg IV q4h  Hypomagnesia -Repleted, currently  1.7 -Continue to trend.  Protein Malnutrition, mild -Per dietitian, patient's girlfriend to bring in FSan Jonshakes which he was consuming PTA  Thrombocytopenia, in setting of cirrhosis -No s/s of bleeding Plts 104,000 down from 170,000  GAD -Continue sertraline 25 mg p.o. nightly  GERD -C/w Pantoprazole 40 mg p.o. twice daily  DVT prophylaxis: SCD's Code Status: Full Family Communication:  Girlfriend at bedside  Status is: Inpatient  Dispo: The patient is from: Home              Anticipated d/c is to: Home              Patient currently is not medically stable for discharge.   Difficult to place patient: No.   Body mass index is 29.7 kg/m.   Consultants:   General Surgery  Orthopedic Surgery  Infectious Diseases  Interventional Radiology  Urology  Procedures:  -2/24left hip total arthroplasty with cement interposition spacer  -3/1left thigh irrigation and debridement with wound vac placement,post op day 1 -3/3 left groin I/D -3/4 image guided aspiration/drainage of remaining left thigh collection.  Antimicrobials:   Ancef IV  Metronidazole po  Subjective:  Patient alert and awake with plan to have aspiration/drainage of remaining left thigh collection. today.  No major issues or complaints overnight.  Says that swelling and pain in left thigh and groin has improved.  Examination:  General exam: Appears calm and comfortable  Respiratory system: Clear to auscultation. Respiratory effort normal. Cardiovascular system: S1 & S2 heard, RRR. No JVD, murmurs, rubs, gallops or clicks. No pedal edema. Gastrointestinal system: Abdomen is nondistended, soft and nontender. No organomegaly or masses felt. Normal bowel sounds heard. Abdomen: Anasarca present without fluid wave.Has lower extremity drains from his left hip with 2 drains and one wound vac. Dressing site for drains clean and dry, wound vac 400 cc of sanguineus drainage  overnight. Extremities:Left lower leg is edematous  with swelling, redness, and blistering of medial aspect of left thigh into left groin and scrotum. GU: Scrotal edema and left groin blistering with adjacent area of left thigh also affected.Perineal area and right buttock also has erythema. Central nervous system: Alert and oriented. No focal neurological deficits. Extremities: Symmetric 5 x 5 power. Skin: No rashes, lesions or ulcers Psychiatry: Judgement and insight appear normal. Mood & affect appropriate.   Objective: Vitals:   09/23/20 0726 09/23/20 0800 09/23/20 0900 09/23/20 1000  BP: 112/70 114/74 110/66 115/70  Pulse: 93 90 88 92  Resp:  _0 Temp:  97.9 F (36.6 C)    TempSrc:  Oral    SpO2:  95% 95% 97%  Weight:      Height:        Intake/Output Summary (Last 24 hours) at 09/23/2020 1039 Last data filed at 09/23/2020 0643 Gross per 24 hour  Intake 1501.93 ml  Output 1065 ml  Net 436.93 ml   Filed Weights   09/19/20 0500 09/19/20 0843 09/21/20 0500  Weight: 91.8 kg 97.8 kg 93.9 kg     Data Reviewed:   CBC: Recent Labs  Lab 09/17/20 0511 09/18/20 0303 09/19/20 0305 09/20/20 0244 09/21/20 0234 09/21/20 1117 09/23/20 0939  WBC 12.1* 15.1* 14.5* 10.7* 11.9*  --  14.7*  NEUTROABS 9.6* 12.2* 11.8* 8.3* 9.8*  --   --   HGB 7.5* 9.8* 8.8* 7.7* 6.4* 7.8* 7.4*  HCT 22.5* 30.2* 26.6* 24.0* 20.3* 23.5* 22.7*  MCV 96.6 97.7 98.5 100.0 102.0*  --  100.4*  PLT 72* 86* 85* 99* 104*  --  160*   Basic Metabolic Panel: Recent Labs  Lab 09/17/20 0511 09/18/20 0303 09/19/20 0305 09/20/20 0244 09/21/20 0234 09/23/20 0243  NA 131* 129* 129* 131* 126* 127*  K 4.3 4.4 4.4 4.3 4.8 5.0  CL 102 98 96* 95* 95* 96*  CO2 _1 GLUCOSE 127* 91 100* 96 173* 142*  BUN _2 CREATININE 0.38* 0.44* 0.41* 0.43* 0.34* 0.44*  CALCIUM 7.2* 7.5* 7.4* 7.4* 7.1* 7.5*  MG 2.0 1.9 1.9 1.9 1.7  --   PHOS 1.6* 1.6* 3.4 2.7 3.1  --    GFR: Estimated  Creatinine Clearance: 147 mL/min (A) (by C-G formula based on SCr of 0.44 mg/dL (L)). Liver Function Tests: Recent Labs  Lab 09/18/20 0303 09/19/20 0305 09/20/20 0244 09/21/20 0234 09/23/20 0243  AST 61* 39 _3 ALT _4 ALKPHOS 270* 244* 240* 214* 211*  BILITOT 1.7* 1.7* 1.6* 1.5* 1.0  PROT 5.7* 5.2* 5.2* 4.8* 5.2*  ALBUMIN 2.3* 2.0* 1.9* 1.7* 1.9*   No results for input(s): LIPASE, AMYLASE in the last 168 hours. No results for input(s): AMMONIA in the last 168 hours. Coagulation Profile: Recent Labs  Lab 09/19/20 0305  INR 1.5*   Cardiac Enzymes: No results for input(s): CKTOTAL, CKMB, CKMBINDEX, TROPONINI in the last 168 hours. BNP (last 3 results) No results for input(s): PROBNP in the last 8760 hours. HbA1C: No results for input(s): HGBA1C in the last 72 hours. CBG: No results for input(s): GLUCAP in the last 168 hours. Lipid Profile: No results for input(s): CHOL, HDL, LDLCALC, TRIG, CHOLHDL, LDLDIRECT in the last 72 hours. Thyroid Function Tests: No results for input(s): TSH, T4TOTAL, FREET4, T3FREE, THYROIDAB in the last 72 hours. Anemia Panel: No results for input(s): VITAMINB12, FOLATE, FERRITIN, TIBC, IRON, RETICCTPCT in the last 72  hours. Sepsis Labs: Recent Labs  Lab 09/18/20 0843 09/18/20 1105 09/19/20 0305 09/20/20 0244  PROCALCITON 7.78  --  4.14 2.02  LATICACIDVEN 2.8* 3.1*  --   --     Recent Results (from the past 240 hour(s))  Culture, blood (routine x 2)     Status: Abnormal   Collection Time: 09/13/20 10:45 AM   Specimen: BLOOD  Result Value Ref Range Status   Specimen Description   Final    BLOOD RIGHT ANTECUBITAL Performed at [facility] [address] [place], [place] [address]    Special Requests   Final    BOTTLES DRAWN AEROBIC AND ANAEROBIC Blood Culture adequate volume Performed at [facility] [address] [place], [place] [address]    Culture  Setup Time   Final     IN BOTH AEROBIC AND ANAEROBIC BOTTLES GRAM POSITIVE COCCI CRITICAL RESULT CALLED TO, READ BACK BY AND VERIFIED WITHSeleta Rhymes [facility] 09/13/20 0347 JDW Performed at [facility], [place] [address], [place], [place] [address]    Culture STAPHYLOCOCCUS AUREUS (A)  Final   Report Status 09/16/2020 FINAL  Final   Organism ID, Bacteria STAPHYLOCOCCUS AUREUS  Final      Susceptibility   Staphylococcus aureus - MIC*    CIPROFLOXACIN <=0.5 SENSITIVE Sensitive     ERYTHROMYCIN <=0.25 SENSITIVE Sensitive     GENTAMICIN <=0.5 SENSITIVE Sensitive     OXACILLIN <=0.25 SENSITIVE Sensitive     TETRACYCLINE <=1 SENSITIVE Sensitive     VANCOMYCIN <=0.5 SENSITIVE Sensitive     TRIMETH/SULFA <=10 SENSITIVE Sensitive     CLINDAMYCIN <=0.25 SENSITIVE Sensitive     RIFAMPIN <=0.5 SENSITIVE Sensitive     Inducible Clindamycin NEGATIVE Sensitive     * STAPHYLOCOCCUS AUREUS  Blood Culture ID Panel (Reflexed)     Status: Abnormal   Collection Time: 09/13/20 10:45 AM  Result Value Ref Range Status   Enterococcus faecalis NOT DETECTED NOT DETECTED Final   Enterococcus Faecium NOT DETECTED NOT DETECTED Final   Listeria monocytogenes NOT DETECTED NOT DETECTED Final   Staphylococcus species DETECTED (A) NOT DETECTED Final    Comment: CRITICAL RESULT CALLED TO, READ BACK BY AND VERIFIED WITH: E JACKSON PHARMD 09/13/20 0347 JDW    Staphylococcus aureus (BCID) DETECTED (A) NOT DETECTED Final    Comment: CRITICAL RESULT CALLED TO, READ BACK BY AND VERIFIED WITH: E JACKSON PHARMD 09/13/20 0347 JDW    Staphylococcus epidermidis NOT DETECTED NOT DETECTED Final   Staphylococcus lugdunensis NOT DETECTED NOT DETECTED Final   Streptococcus species NOT DETECTED NOT DETECTED Final   Streptococcus agalactiae NOT DETECTED NOT DETECTED Final   Streptococcus pneumoniae NOT DETECTED NOT DETECTED Final   Streptococcus pyogenes NOT DETECTED NOT DETECTED Final   A.calcoaceticus-baumannii NOT DETECTED NOT DETECTED Final    Bacteroides fragilis NOT DETECTED NOT DETECTED Final   Enterobacterales NOT DETECTED NOT DETECTED Final   Enterobacter cloacae complex NOT DETECTED NOT DETECTED Final   Escherichia coli NOT DETECTED NOT DETECTED Final   Klebsiella aerogenes NOT DETECTED NOT DETECTED Final   Klebsiella oxytoca NOT DETECTED NOT DETECTED Final   Klebsiella pneumoniae NOT DETECTED NOT DETECTED Final   Proteus species NOT DETECTED NOT DETECTED Final   Salmonella species NOT DETECTED NOT DETECTED Final   Serratia marcescens NOT DETECTED NOT DETECTED Final   Haemophilus influenzae NOT DETECTED NOT DETECTED Final   Neisseria meningitidis NOT DETECTED NOT DETECTED Final   Pseudomonas aeruginosa NOT DETECTED NOT DETECTED Final  Stenotrophomonas maltophilia NOT DETECTED NOT DETECTED Final   Candida albicans NOT DETECTED NOT DETECTED Final   Candida auris NOT DETECTED NOT DETECTED Final   Candida glabrata NOT DETECTED NOT DETECTED Final   Candida krusei NOT DETECTED NOT DETECTED Final   Candida parapsilosis NOT DETECTED NOT DETECTED Final   Candida tropicalis NOT DETECTED NOT DETECTED Final   Cryptococcus neoformans/gattii NOT DETECTED NOT DETECTED Final   Meth resistant mecA/C and MREJ NOT DETECTED NOT DETECTED Final    Comment: Performed at [facility], [place] [address], [place], [place] [address]  Culture, blood (routine x 2)     Status: Abnormal   Collection Time: 09/13/20 11:20 AM   Specimen: BLOOD  Result Value Ref Range Status   Specimen Description   Final    BLOOD RIGHT ANTECUBITAL Performed at [facility] [address], [place], [place] [address]    Special Requests   Final    BOTTLES DRAWN AEROBIC AND ANAEROBIC Blood Culture adequate volume Performed at [facility] [address], [place], [place] [address]    Culture  Setup Time   Final    IN BOTH AEROBIC AND ANAEROBIC BOTTLES GRAM POSITIVE COCCI CRITICAL VALUE NOTED.  VALUE IS CONSISTENT WITH  PREVIOUSLY REPORTED AND CALLED VALUE.    Culture (A)  Final    STAPHYLOCOCCUS AUREUS SUSCEPTIBILITIES PERFORMED ON PREVIOUS CULTURE WITHIN THE LAST 5 DAYS. Performed at [facility], [place] [address] [place], [place] [address]    Report Status 09/16/2020 FINAL  Final  Resp Panel by RT-PCR (Flu A&B, Covid) Nasopharyngeal Swab     Status: None   Collection Time: 09/13/20  2:01 PM   Specimen: Nasopharyngeal Swab; Nasopharyngeal(NP) swabs in vial transport medium  Result Value Ref Range Status   SARS Coronavirus 2 by RT PCR NEGATIVE NEGATIVE Final    Comment: (NOTE) SARS-CoV-2 target nucleic acids are NOT DETECTED.  The SARS-CoV-2 RNA is generally detectable in upper respiratory specimens during the acute phase of infection. The lowest concentration of SARS-CoV-2 viral copies this assay can detect is 138 copies/mL. A negative result does not preclude SARS-Cov-2 infection and should not be used as the sole basis for treatment or other patient management decisions. A negative result may occur with  improper specimen collection/handling, submission of specimen other than nasopharyngeal swab, presence of viral mutation(s) within the areas targeted by this assay, and inadequate number of viral copies(<138 copies/mL). A negative result must be combined with clinical observations, patient history, and epidemiological information. The expected result is Negative.  Fact Sheet for Patients:  EntrepreneurPulse.com.au  Fact Sheet for Healthcare Providers:  IncredibleEmployment.be  This test is no t yet approved or cleared by the [place] FDA and  has been authorized for detection and/or diagnosis of SARS-CoV-2 by FDA under an Emergency Use Authorization (EUA). This EUA will remain  in effect (meaning this test can be used) for the duration of the COVID-19 declaration under Section 564(b)(1) of the Act, 21 U.S.C.section 360bbb-3(b)(1), unless the  authorization is terminated  or revoked sooner.       Influenza A by PCR NEGATIVE NEGATIVE Final   Influenza B by PCR NEGATIVE NEGATIVE Final    Comment: (NOTE) The Xpert Xpress SARS-CoV-2/FLU/RSV plus assay is intended as an aid in the diagnosis of influenza from Nasopharyngeal swab specimens and should not be used as a sole basis for treatment. Nasal washings and aspirates are unacceptable for Xpert Xpress SARS-CoV-2/FLU/RSV testing.  Fact Sheet  for Patients: EntrepreneurPulse.com.au  Fact Sheet for Healthcare Providers: IncredibleEmployment.be  This test is not yet approved or cleared by the [place] FDA and has been authorized for detection and/or diagnosis of SARS-CoV-2 by FDA under an Emergency Use Authorization (EUA). This EUA will remain in effect (meaning this test can be used) for the duration of the COVID-19 declaration under Section 564(b)(1) of the Act, 21 U.S.C. section 360bbb-3(b)(1), unless the authorization is terminated or revoked.  Performed at [facility], [place] [address], [place], [place] [address]   Culture, blood (routine x 2)     Status: None   Collection Time: 09/14/20  9:23 AM   Specimen: BLOOD RIGHT HAND  Result Value Ref Range Status   Specimen Description   Final    BLOOD RIGHT HAND Performed at [facility] [address], [place], [place] [address]    Special Requests   Final    BOTTLES DRAWN AEROBIC AND ANAEROBIC Blood Culture adequate volume Performed at [facility] [address] [place], [place] [address]    Culture   Final    NO GROWTH 5 DAYS Performed at [facility], [place] [address] [place], [place] [address]    Report Status 09/19/2020 FINAL  Final  Culture, blood (routine x 2)     Status: None   Collection Time: 09/14/20  9:24 AM   Specimen: BLOOD LEFT HAND  Result Value Ref Range Status   Specimen Description   Final     BLOOD LEFT HAND Performed at [facility] [address], [place], [place] [address]    Special Requests   Final    BOTTLES DRAWN AEROBIC ONLY Blood Culture adequate volume Performed at [facility] [address], [place], [place] [address]    Culture   Final    NO GROWTH 5 DAYS Performed at [facility], [place] [address] [place], [place] [address]    Report Status 09/19/2020 FINAL  Final  Aerobic/Anaerobic Culture (surgical/deep wound)     Status: None   Collection Time: 09/14/20 12:37 PM   Specimen: Abscess  Result Value Ref Range Status   Specimen Description   Final    ABSCESS  LEFT RP Performed at [facility] [address] [place], [place] [address]    Special Requests   Final    Normal Performed at [facility], [place] [address], [place], [place] [address]    Gram Stain   Final    MODERATE WBC PRESENT, PREDOMINANTLY PMN ABUNDANT GRAM POSITIVE COCCI ABUNDANT GRAM NEGATIVE RODS    Culture   Final    ABUNDANT STAPHYLOCOCCUS AUREUS MODERATE BACTEROIDES ORALIS FEW PREVOTELLA BIVIA BETA LACTAMASE POSITIVE Performed at [facility], [place] [address], [place], [place] [address]    Report Status 09/19/2020 FINAL  Final   Organism ID, Bacteria STAPHYLOCOCCUS AUREUS  Final      Susceptibility   Staphylococcus aureus - MIC*    CIPROFLOXACIN <=0.5 SENSITIVE Sensitive     ERYTHROMYCIN <=0.25 SENSITIVE Sensitive     GENTAMICIN <=0.5 SENSITIVE Sensitive     OXACILLIN <=0.25 SENSITIVE Sensitive     TETRACYCLINE <=1 SENSITIVE Sensitive     VANCOMYCIN <=0.5 SENSITIVE Sensitive     TRIMETH/SULFA <=10 SENSITIVE Sensitive     CLINDAMYCIN <=0.25 SENSITIVE Sensitive     RIFAMPIN <=0.5 SENSITIVE Sensitive     Inducible Clindamycin NEGATIVE Sensitive     *  ABUNDANT STAPHYLOCOCCUS AUREUS  MRSA PCR Screening     Status: None   Collection Time: 09/15/20  3:22 AM   Specimen: Nasal Mucosa;  Nasopharyngeal  Result Value Ref Range Status   MRSA by PCR NEGATIVE NEGATIVE Final    Comment:        The GeneXpert MRSA Assay (FDA approved for NASAL specimens only), is one component of a comprehensive MRSA colonization surveillance program. It is not intended to diagnose MRSA infection nor to guide or monitor treatment for MRSA infections. Performed at [facility], [place] [address], [place], [place] [address]          Radiology Studies: CT ABDOMEN PELVIS W CONTRAST  Result Date: 09/21/2020 CLINICAL DATA:  Abdominal abscess/infection suspected. Status post orthopedic surgery on 09/15/2020. LEFT thigh incision and drainage on 09/20/2020. EXAM: CT ABDOMEN AND PELVIS WITH CONTRAST CT LEFT FEMUR WITH CONTRAST TECHNIQUE: Multidetector CT imaging of the abdomen and pelvis was performed using the standard protocol following bolus administration of intravenous contrast. CONTRAST:  167m OMNIPAQUE IOHEXOL 300 MG/ML  SOLN COMPARISON:  CT abdomen pelvis dated 09/18/2020. CT LEFT femur dated 09/18/2020. FINDINGS: CT abdomen AND PELVIS FINDINGS: Lower chest: Mild bibasilar atelectasis. Hepatobiliary: Cirrhotic-appearing liver. No focal mass or lesion is seen within the liver. Gallbladder is unremarkable. No bile duct dilatation. Pancreas: Unremarkable. No pancreatic ductal dilatation or surrounding inflammatory changes. Spleen: Perhaps mild splenomegaly.  Otherwise unremarkable. Adrenals/Urinary Tract: Adrenal glands appear normal. Kidneys are unremarkable without mass, stone or hydronephrosis. No ureteral or bladder calculi are identified. Bladder is decompressed by Foley catheter. Stomach/Bowel: No dilated large or small bowel loops. Stomach is unremarkable. Vascular/Lymphatic: No acute appearing vascular abnormality. No enlarged lymph nodes are seen. Reproductive: Prostate gland is unremarkable. Other: Moderate to large ascites, similar to the previous CT of 09/18/2020. No new fluid  collection or abscess. No free intraperitoneal air. Musculoskeletal: No acute appearing osseous abnormality. Stable position of the LEFT inguinal approach surgical drain with pigtail terminating at the site of a previously demonstrated fluid collection in the LEFT lower psoas muscle. This collection has resolved in the interval. The additional complex fluid collection within the iliacus muscle is also significantly improved and nearly resolved, perhaps a small residual component overlying the LEFT SI joint (series 5, image 81). Persistent anasarca without significant change. CT LEFT FEMUR FINDINGS: Interval surgical evacuation of the abscess collection in the medial LEFT thigh. No persistent fluid collection is seen, with expected postsurgical changes of the overlying soft tissues and with associated packing in place at the overlying skin defect. Grossly stable abscess collection anterior to the upper LEFT femur, measuring 3.3 cm (series 11, image 150). The larger fluid collection seen at this level on the previous study is no longer identified status post the interval incision and drainage. The drainage catheters have been removed from the upper thigh. No new fluid collection or abscess-like collection is identified within the soft tissues about the LEFT femur. Soft tissues about the LEFT knee are unremarkable. No hemorrhage or contrast extravasation is seen within the soft tissues about the LEFT femur. Osseous femur itself appears intact and normal in mineralization. No cortical defect, lucency or destructive change to suggest associated osteomyelitis. IMPRESSION: 1. Stable position of the LEFT inguinal approach surgical drain with pigtail terminating at the site of a previously demonstrated fluid collection in the LEFT lower psoas muscle. This psoas muscle collection has resolved in the interval. 2. The additional complex fluid collection within the iliacus muscle is also significantly  improved and nearly  resolved, perhaps a small residual component overlying the LEFT SI joint. 3. Moderate to large ascites, similar to the previous CT of 09/18/2020. No new fluid collection or abscess. 4. Cirrhotic-appearing liver. 5. Largest abscess collections within the medial aspect of the LEFT thigh are no longer seen status post interval incision and drainage. Expected postsurgical changes of the overlying soft tissues. No evidence of surgical complicating feature. 6. Stable smaller abscess collection anterior to the LEFT femur, measuring 3.3 cm greatest dimension. 7. Osseous femur appears intact and normal in mineralization. No evidence of associated osteomyelitis. Electronically Signed   By: Franki Cabot M.D.   On: 09/21/2020 17:42   CT FEMUR LEFT W CONTRAST  Result Date: 09/21/2020 CLINICAL DATA:  Abdominal abscess/infection suspected. Status post orthopedic surgery on 09/15/2020. LEFT thigh incision and drainage on 09/20/2020. EXAM: CT ABDOMEN AND PELVIS WITH CONTRAST CT LEFT FEMUR WITH CONTRAST TECHNIQUE: Multidetector CT imaging of the abdomen and pelvis was performed using the standard protocol following bolus administration of intravenous contrast. CONTRAST:  154m OMNIPAQUE IOHEXOL 300 MG/ML  SOLN COMPARISON:  CT abdomen pelvis dated 09/18/2020. CT LEFT femur dated 09/18/2020. FINDINGS: CT abdomen AND PELVIS FINDINGS: Lower chest: Mild bibasilar atelectasis. Hepatobiliary: Cirrhotic-appearing liver. No focal mass or lesion is seen within the liver. Gallbladder is unremarkable. No bile duct dilatation. Pancreas: Unremarkable. No pancreatic ductal dilatation or surrounding inflammatory changes. Spleen: Perhaps mild splenomegaly.  Otherwise unremarkable. Adrenals/Urinary Tract: Adrenal glands appear normal. Kidneys are unremarkable without mass, stone or hydronephrosis. No ureteral or bladder calculi are identified. Bladder is decompressed by Foley catheter. Stomach/Bowel: No dilated large or small bowel loops. Stomach  is unremarkable. Vascular/Lymphatic: No acute appearing vascular abnormality. No enlarged lymph nodes are seen. Reproductive: Prostate gland is unremarkable. Other: Moderate to large ascites, similar to the previous CT of 09/18/2020. No new fluid collection or abscess. No free intraperitoneal air. Musculoskeletal: No acute appearing osseous abnormality. Stable position of the LEFT inguinal approach surgical drain with pigtail terminating at the site of a previously demonstrated fluid collection in the LEFT lower psoas muscle. This collection has resolved in the interval. The additional complex fluid collection within the iliacus muscle is also significantly improved and nearly resolved, perhaps a small residual component overlying the LEFT SI joint (series 5, image 81). Persistent anasarca without significant change. CT LEFT FEMUR FINDINGS: Interval surgical evacuation of the abscess collection in the medial LEFT thigh. No persistent fluid collection is seen, with expected postsurgical changes of the overlying soft tissues and with associated packing in place at the overlying skin defect. Grossly stable abscess collection anterior to the upper LEFT femur, measuring 3.3 cm (series 11, image 150). The larger fluid collection seen at this level on the previous study is no longer identified status post the interval incision and drainage. The drainage catheters have been removed from the upper thigh. No new fluid collection or abscess-like collection is identified within the soft tissues about the LEFT femur. Soft tissues about the LEFT knee are unremarkable. No hemorrhage or contrast extravasation is seen within the soft tissues about the LEFT femur. Osseous femur itself appears intact and normal in mineralization. No cortical defect, lucency or destructive change to suggest associated osteomyelitis. IMPRESSION: 1. Stable position of the LEFT inguinal approach surgical drain with pigtail terminating at the site of a  previously demonstrated fluid collection in the LEFT lower psoas muscle. This psoas muscle collection has resolved in the interval. 2. The additional complex fluid collection within the iliacus  muscle is also significantly improved and nearly resolved, perhaps a small residual component overlying the LEFT SI joint. 3. Moderate to large ascites, similar to the previous CT of 09/18/2020. No new fluid collection or abscess. 4. Cirrhotic-appearing liver. 5. Largest abscess collections within the medial aspect of the LEFT thigh are no longer seen status post interval incision and drainage. Expected postsurgical changes of the overlying soft tissues. No evidence of surgical complicating feature. 6. Stable smaller abscess collection anterior to the LEFT femur, measuring 3.3 cm greatest dimension. 7. Osseous femur appears intact and normal in mineralization. No evidence of associated osteomyelitis. Electronically Signed   By: Franki Cabot M.D.   On: 09/21/2020 17:42        Scheduled Meds: . (feeding supplement) PROSource Plus  30 mL Oral BID BM  . sodium chloride   Intravenous Once  . carvedilol  6.25 mg Oral BID WC  . Chlorhexidine Gluconate Cloth  6 each Topical Daily  . docusate sodium  100 mg Oral BID  . feeding supplement  1 Container Oral BID BM  . folic acid  1 mg Oral Daily  . lactulose  10 g Oral BID  . mouth rinse  15 mL Mouth Rinse BID  . metroNIDAZOLE  500 mg Oral Q8H  . multivitamin with minerals  1 tablet Oral Daily  . nutrition supplement (JUVEN)  1 packet Oral BID BM  . pantoprazole  40 mg Oral BID  . sertraline  25 mg Oral Daily  . sodium chloride flush  5 mL Intracatheter Q8H  . thiamine  100 mg Oral Daily   Continuous Infusions: .  ceFAZolin (ANCEF) IV Stopped (09/23/20 0542)     LOS: 10 days   Time spent= 35 mins   Dede Query, RN NP-student   If 7PM-7AM, please contact night-coverage  09/23/2020, 10:39 AM

## 2020-09-23 NOTE — TOC Progression Note (Signed)
Transition of Care [facility]) - Progression Note    Patient Details  Name: [name] MRN: [id number] Date of Birth: 1985/08/25  [facility] CM/SW Contact  Golda Acre, RN Phone Number: 09/23/2020, 7:59 AM  Clinical Narrative:    03022/Preop diagnosis: Left groin abscess Postop diagnosis: Same Procedure: Incision and drainage of left groin abscess and change of wound VAC to left thigh PLAN: home with [facility] following for progression and toc needs.  Expected Discharge Plan: [facility]hab Facility ([facility]) Barriers to Discharge: Continued Medical Work up  Expected Discharge Plan and Services Expected Discharge Plan: [facility] Rehab Facility ([facility])   Discharge Planning Services: CM Consult   Living arrangements for the past 2 months: Single Family Home                                       Social Determinants of Health (SDOH) Interventions    Readmission Risk Interventions No flowsheet data found.

## 2020-09-23 NOTE — Progress Notes (Signed)
1 Day Post-Op  Subjective: Patient already had dressing change today despite no orders for this.  Had a lot of bleeding in OR yesterday.  No new complaints today  ROS: See above, otherwise other systems negative  Objective: Vital signs in last 24 hours: Temp:  [97.6 F (36.4 C)-99.8 F (37.7 C)] 98.2 F (36.8 C) (03/04 0400) Pulse Rate:  [90-123] 90 (03/04 0800) Resp:  [14-23] 15 (03/04 0800) BP: (109-136)/(56-92) 114/74 (03/04 0800) SpO2:  [91 %-100 %] 95 % (03/04 0800) Last BM Date: 09/22/20  Intake/Output from previous day: 03/03 0701 - 03/04 0700 In: 1501.9 [P.O.:715; I.V.:400; IV Piggyback:386.9] Out: 1065 [Urine:625; Drains:440] Intake/Output this shift: No intake/output data recorded.  PE: Skin: left groin wound is packed.  Erythema as expected around the wound.  No purulent drainage noted.  Dressing left in place.  Lab Results:  Recent Labs    09/21/20 0234 09/21/20 1117  WBC 11.9*  --   HGB 6.4* 7.8*  HCT 20.3* 23.5*  PLT 104*  --    BMET Recent Labs    09/21/20 0234 09/23/20 0243  NA 126* 127*  K 4.8 5.0  CL 95* 96*  CO2 23 23  GLUCOSE 173* 142*  BUN 10 11  CREATININE 0.34* 0.44*  CALCIUM 7.1* 7.5*   PT/INR No results for input(s): LABPROT, INR in the last 72 hours. CMP     Component Value Date/Time   NA 127 (L) 09/23/2020 0243   K 5.0 09/23/2020 0243   CL 96 (L) 09/23/2020 0243   CO2 23 09/23/2020 0243   GLUCOSE 142 (H) 09/23/2020 0243   BUN 11 09/23/2020 0243   CREATININE 0.44 (L) 09/23/2020 0243   CALCIUM 7.5 (L) 09/23/2020 0243   PROT 5.2 (L) 09/23/2020 0243   ALBUMIN 1.9 (L) 09/23/2020 0243   AST 31 09/23/2020 0243   ALT 9 09/23/2020 0243   ALKPHOS 211 (H) 09/23/2020 0243   BILITOT 1.0 09/23/2020 0243   GFRNONAA >60 09/23/2020 0243   GFRAA >60 02/24/2019 1520   Lipase     Component Value Date/Time   LIPASE 99 (H) 08/23/2020 1500       Studies/Results: CT ABDOMEN PELVIS W CONTRAST  Result Date:  09/21/2020 CLINICAL DATA:  Abdominal abscess/infection suspected. Status post orthopedic surgery on 09/15/2020. LEFT thigh incision and drainage on 09/20/2020. EXAM: CT ABDOMEN AND PELVIS WITH CONTRAST CT LEFT FEMUR WITH CONTRAST TECHNIQUE: Multidetector CT imaging of the abdomen and pelvis was performed using the standard protocol following bolus administration of intravenous contrast. CONTRAST:  OMNIPAQUE IOHEXOL 300 MG/ML  SOLN COMPARISON:  CT abdomen pelvis dated 09/18/2020. CT LEFT femur dated 09/18/2020. FINDINGS: CT abdomen AND PELVIS FINDINGS: Lower chest: Mild bibasilar atelectasis. Hepatobiliary: Cirrhotic-appearing liver. No focal mass or lesion is seen within the liver. Gallbladder is unremarkable. No bile duct dilatation. Pancreas: Unremarkable. No pancreatic ductal dilatation or surrounding inflammatory changes. Spleen: Perhaps mild splenomegaly.  Otherwise unremarkable. Adrenals/Urinary Tract: Adrenal glands appear normal. Kidneys are unremarkable without mass, stone or hydronephrosis. No ureteral or bladder calculi are identified. Bladder is decompressed by Foley catheter. Stomach/Bowel: No dilated large or small bowel loops. Stomach is unremarkable. Vascular/Lymphatic: No acute appearing vascular abnormality. No enlarged lymph nodes are seen. Reproductive: Prostate gland is unremarkable. Other: Moderate to large ascites, similar to the previous CT of 09/18/2020. No new fluid collection or abscess. No free intraperitoneal air. Musculoskeletal: No acute appearing osseous abnormality. Stable position of the LEFT inguinal approach surgical drain with pigtail terminating at  the site of a previously demonstrated fluid collection in the LEFT lower psoas muscle. This collection has resolved in the interval. The additional complex fluid collection within the iliacus muscle is also significantly improved and nearly resolved, perhaps a small residual component overlying the LEFT SI joint (series 5, image  81). Persistent anasarca without significant change. CT LEFT FEMUR FINDINGS: Interval surgical evacuation of the abscess collection in the medial LEFT thigh. No persistent fluid collection is seen, with expected postsurgical changes of the overlying soft tissues and with associated packing in place at the overlying skin defect. Grossly stable abscess collection anterior to the upper LEFT femur, measuring 3.3 cm (series 11, image 150). The larger fluid collection seen at this level on the previous study is no longer identified status post the interval incision and drainage. The drainage catheters have been removed from the upper thigh. No new fluid collection or abscess-like collection is identified within the soft tissues about the LEFT femur. Soft tissues about the LEFT knee are unremarkable. No hemorrhage or contrast extravasation is seen within the soft tissues about the LEFT femur. Osseous femur itself appears intact and normal in mineralization. No cortical defect, lucency or destructive change to suggest associated osteomyelitis. IMPRESSION: 1. Stable position of the LEFT inguinal approach surgical drain with pigtail terminating at the site of a previously demonstrated fluid collection in the LEFT lower psoas muscle. This psoas muscle collection has resolved in the interval. 2. The additional complex fluid collection within the iliacus muscle is also significantly improved and nearly resolved, perhaps a small residual component overlying the LEFT SI joint. 3. Moderate to large ascites, similar to the previous CT of 09/18/2020. No new fluid collection or abscess. 4. Cirrhotic-appearing liver. 5. Largest abscess collections within the medial aspect of the LEFT thigh are no longer seen status post interval incision and drainage. Expected postsurgical changes of the overlying soft tissues. No evidence of surgical complicating feature. 6. Stable smaller abscess collection anterior to the LEFT femur, measuring 3.3  cm greatest dimension. 7. Osseous femur appears intact and normal in mineralization. No evidence of associated osteomyelitis. Electronically Signed   By: Bary Richard M.D.   On: 09/21/2020 17:42   CT FEMUR LEFT W CONTRAST  Result Date: 09/21/2020 CLINICAL DATA:  Abdominal abscess/infection suspected. Status post orthopedic surgery on 09/15/2020. LEFT thigh incision and drainage on 09/20/2020. EXAM: CT ABDOMEN AND PELVIS WITH CONTRAST CT LEFT FEMUR WITH CONTRAST TECHNIQUE: Multidetector CT imaging of the abdomen and pelvis was performed using the standard protocol following bolus administration of intravenous contrast. CONTRAST:  OMNIPAQUE IOHEXOL 300 MG/ML  SOLN COMPARISON:  CT abdomen pelvis dated 09/18/2020. CT LEFT femur dated 09/18/2020. FINDINGS: CT abdomen AND PELVIS FINDINGS: Lower chest: Mild bibasilar atelectasis. Hepatobiliary: Cirrhotic-appearing liver. No focal mass or lesion is seen within the liver. Gallbladder is unremarkable. No bile duct dilatation. Pancreas: Unremarkable. No pancreatic ductal dilatation or surrounding inflammatory changes. Spleen: Perhaps mild splenomegaly.  Otherwise unremarkable. Adrenals/Urinary Tract: Adrenal glands appear normal. Kidneys are unremarkable without mass, stone or hydronephrosis. No ureteral or bladder calculi are identified. Bladder is decompressed by Foley catheter. Stomach/Bowel: No dilated large or small bowel loops. Stomach is unremarkable. Vascular/Lymphatic: No acute appearing vascular abnormality. No enlarged lymph nodes are seen. Reproductive: Prostate gland is unremarkable. Other: Moderate to large ascites, similar to the previous CT of 09/18/2020. No new fluid collection or abscess. No free intraperitoneal air. Musculoskeletal: No acute appearing osseous abnormality. Stable position of the LEFT inguinal approach surgical drain with  pigtail terminating at the site of a previously demonstrated fluid collection in the LEFT lower psoas muscle.  This collection has resolved in the interval. The additional complex fluid collection within the iliacus muscle is also significantly improved and nearly resolved, perhaps a small residual component overlying the LEFT SI joint (series 5, image 81). Persistent anasarca without significant change. CT LEFT FEMUR FINDINGS: Interval surgical evacuation of the abscess collection in the medial LEFT thigh. No persistent fluid collection is seen, with expected postsurgical changes of the overlying soft tissues and with associated packing in place at the overlying skin defect. Grossly stable abscess collection anterior to the upper LEFT femur, measuring 3.3 cm (series 11, image 150). The larger fluid collection seen at this level on the previous study is no longer identified status post the interval incision and drainage. The drainage catheters have been removed from the upper thigh. No new fluid collection or abscess-like collection is identified within the soft tissues about the LEFT femur. Soft tissues about the LEFT knee are unremarkable. No hemorrhage or contrast extravasation is seen within the soft tissues about the LEFT femur. Osseous femur itself appears intact and normal in mineralization. No cortical defect, lucency or destructive change to suggest associated osteomyelitis. IMPRESSION: 1. Stable position of the LEFT inguinal approach surgical drain with pigtail terminating at the site of a previously demonstrated fluid collection in the LEFT lower psoas muscle. This psoas muscle collection has resolved in the interval. 2. The additional complex fluid collection within the iliacus muscle is also significantly improved and nearly resolved, perhaps a small residual component overlying the LEFT SI joint. 3. Moderate to large ascites, similar to the previous CT of 09/18/2020. No new fluid collection or abscess. 4. Cirrhotic-appearing liver. 5. Largest abscess collections within the medial aspect of the LEFT thigh are no  longer seen status post interval incision and drainage. Expected postsurgical changes of the overlying soft tissues. No evidence of surgical complicating feature. 6. Stable smaller abscess collection anterior to the LEFT femur, measuring 3.3 cm greatest dimension. 7. Osseous femur appears intact and normal in mineralization. No evidence of associated osteomyelitis. Electronically Signed   By: Bary Richard M.D.   On: 09/21/2020 17:42    Anti-infectives: Anti-infectives (From admission, onward)   Start     Dose/Rate Route Frequency Ordered Stop   09/18/20 1400  metroNIDAZOLE (FLAGYL) tablet 500 mg        500 mg Oral Every 8 hours 09/18/20 0927     09/15/20 1715  tobramycin (NEBCIN) powder  Status:  Discontinued          As needed 09/15/20 1715 09/15/20 2043   09/15/20 1715  vancomycin (VANCOCIN) powder  Status:  Discontinued          As needed 09/15/20 1716 09/15/20 2043   09/14/20 0600  ceFAZolin (ANCEF) IVPB 2g/100 mL premix        2 g 200 mL/hr over 30 Minutes Intravenous Every 8 hours 09/14/20 0359     09/13/20 2200  vancomycin (VANCOREADY) IVPB 1500 mg/300 mL  Status:  Discontinued        1,500 mg 150 mL/hr over 120 Minutes Intravenous Every 12 hours 09/13/20 1636 09/14/20 0359   09/13/20 1500  ceFEPIme (MAXIPIME) 2 g in sodium chloride 0.9 % 100 mL IVPB  Status:  Discontinued        2 g 200 mL/hr over 30 Minutes Intravenous Every 8 hours 09/13/20 1437 09/14/20 0359   09/13/20 1130  vancomycin (VANCOREADY) IVPB  1750 mg/350 mL        1,750 mg 175 mL/hr over 120 Minutes Intravenous  Once 09/13/20 1107 09/13/20 1414   09/13/20 1045  ceFAZolin (ANCEF) IVPB 1 g/50 mL premix        1 g 100 mL/hr over 30 Minutes Intravenous  Once 09/13/20 1031 09/13/20 1307       Assessment/Plan Symptomatic anemia  Sinus tachycardia, chronic  Thrombocytopenia - Plts 104 today, monitor PMH alcoholic hepatitis Hepatic cirrhosis with ascites GAD GERD Anemia - hgb 6.4 and tx up to 7.8.  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 Sena SlateLaundau 09/20/20    Left thigh irrigation and debridement with wound vac placement, Dr. Dion SaucierLandau   -start BID dressing changes to left groin wound starting tomorrow.  If this is clean enough, next week when he gets his thigh VAC changed, WOC may be able to place a VAC on the groin wound and connect via a Y connector.  Will assess at that time. -WBC pending today  FEN - regular diet VTE - none currently ID - flagyl, ancef   LOS: 10 days    Letha CapeKelly E Shantasia Hunnell , [facility] 09/23/2020, 8:32 AM Please see Amion for pager number during day hours 7:00am-4:30pm or 7:00am -11:30am on weekends

## 2020-09-23 NOTE — Procedures (Signed)
Interventional Radiology Procedure Note  Procedure: CT Guided Drainage of left thigh fluid collection  Complications: None  Estimated Blood Loss: < 10 mL  Findings: 10.2 Fr drain placed in left mid thigh fluid collection with return of minimal serous fluid. Drain attached to suction bulb drainage.  Plan: Keep to bulb suction. Please record output from drain. Interventional Radiology will follow.   Al Corpus Mihailo Sage MD

## 2020-09-23 NOTE — Progress Notes (Signed)
Referring Physician(s): Cornett,T/Lama,G  Supervising Physician: Mir, [employer] school teacherarhaan  Patient Status:  [facility]  Chief Complaint:  Left hip/thigh pain/abscesses  Subjective: Pt doing ok this am; cont to have some left thigh/hip discomfort; denies resp issues, fever/chills, N/V    Allergies: Patient has no active allergies.  Medications: Prior to Admission medications   Medication Sig Start Date End Date Taking? Authorizing Provider  carvedilol (COREG) 3.125 MG tablet Take 3.125 mg by mouth 2 (two) times daily. 07/28/20  Yes [provider]  folic acid (FOLVITE) 1 MG tablet Take 1 tablet (1 mg total) by mouth daily. 08/30/20  Yes Pokhrel, Laxman, MD  guaiFENesin-dextromethorphan (ROBITUSSIN DM) 100-10 MG/5ML syrup Take 5 mLs by mouth every 4 (four) hours as needed for cough (chest congestion). Patient taking differently: Take 10 mLs by mouth every 4 (four) hours as needed for cough (chest congestion). 08/29/20  Yes Pokhrel, Laxman, MD  lactulose (CHRONULAC) 10 GM/15ML solution Take 15 mLs (10 g total) by mouth 2 (two) times daily. 08/29/20  Yes Pokhrel, Laxman, MD  magnesium oxide (MAG-OX) 400 MG tablet Take 1 tablet by mouth daily. 07/28/20  Yes [provider]  pantoprazole (PROTONIX) 40 MG tablet Take 1 tablet (40 mg total) by mouth 2 (two) times daily before a meal for 28 days, THEN 1 tablet (40 mg total) daily for 28 days. 08/29/20 10/24/20 Yes Pokhrel, Laxman, MD  prednisoLONE (ORAPRED) 15 MG/5ML solution Take 1.6-13.3 mLs by mouth See admin instructions. Take 13.803ml by mouth daily for 28 days, 10ml for 4 days, 6.536ml daily for 4 days, 3.3 daily for 4 days, then 1.496ml daily for 3 days. 08/29/20  Yes [provider]  sertraline (ZOLOFT) 25 MG tablet Take 25 mg by mouth at bedtime. 08/20/20  Yes [provider]  thiamine 100 MG tablet Take 1 tablet (100 mg total) by mouth daily. 08/30/20  Yes Pokhrel, Laxman, MD  buPROPion (WELLBUTRIN XL) 300 MG 24 hr tablet Take 1  tablet (300 mg total) by mouth daily. Patient not taking: No sig reported 10/16/13   Rachael FeeLugo, Irving A, MD  carbamazepine (TEGRETOL XR) 200 MG 12 hr tablet Take 1 tablet (200 mg total) by mouth at bedtime. Patient not taking: No sig reported 10/16/13   Rachael FeeLugo, Irving A, MD  hydrOXYzine (ATARAX/VISTARIL) 25 MG tablet Take 1 tablet (25 mg total) by mouth every 6 (six) hours as needed for anxiety (or CIWA score </= 10). Patient not taking: No sig reported 10/16/13   Rachael FeeLugo, Irving A, MD  lisinopril-hydrochlorothiazide (ZESTORETIC) 20-25 MG tablet Take 1 tablet by mouth daily. Patient not taking: No sig reported 08/31/20   Pokhrel, Rebekah ChesterfieldLaxman, MD  oxyCODONE (ROXICODONE) 5 MG immediate release tablet Take 1 tablet (5 mg total) by mouth every 8 (eight) hours as needed. Patient not taking: No sig reported 08/29/20 08/29/21  Pokhrel, Rebekah ChesterfieldLaxman, MD  prednisoLONE 5 MG TABS tablet Take 8 tablets (40 mg total) by mouth daily for 28 days, THEN 6 tablets (30 mg total) daily for 4 days, THEN 4 tablets (20 mg total) daily for 4 days, THEN 2 tablets (10 mg total) daily for 4 days, THEN 1 tablet (5 mg total) every 3 (three) days for 1 day. Patient not taking: Reported on 09/13/2020 08/29/20 10/09/20  Joycelyn DasPokhrel, Laxman, MD  traMADol (ULTRAM) 50 MG tablet Take 1 tablet (50 mg total) by mouth every 6 (six) hours as needed. Patient not taking: Reported on 09/13/2020 08/29/20   Joycelyn DasPokhrel, Laxman, MD     Vital  Signs: BP 114/74 (BP Location: Right Arm)   Pulse 90   Temp 97.9 F (36.6 C) (Oral)   Resp 15   Ht 5\' 10"  (1.778 m)   Wt 207 lb 0.2 oz (93.9 kg)   SpO2 95%   BMI 29.70 kg/m   Physical Exam awake/alert; chest- CTA bilat; heart- RRR; abd- soft,+BS,NT; left thigh drains in place draining blood tinged fluid with some tissue fragments; left thigh wound vac in place  Imaging: CT ABDOMEN PELVIS W CONTRAST  Result Date: 09/21/2020 CLINICAL DATA:  Abdominal abscess/infection suspected. Status post orthopedic surgery on 09/15/2020. LEFT thigh  incision and drainage on 09/20/2020. EXAM: CT ABDOMEN AND PELVIS WITH CONTRAST CT LEFT FEMUR WITH CONTRAST TECHNIQUE: Multidetector CT imaging of the abdomen and pelvis was performed using the standard protocol following bolus administration of intravenous contrast. CONTRAST:  11/20/2020 OMNIPAQUE IOHEXOL 300 MG/ML  SOLN COMPARISON:  CT abdomen pelvis dated 09/18/2020. CT LEFT femur dated 09/18/2020. FINDINGS: CT abdomen AND PELVIS FINDINGS: Lower chest: Mild bibasilar atelectasis. Hepatobiliary: Cirrhotic-appearing liver. No focal mass or lesion is seen within the liver. Gallbladder is unremarkable. No bile duct dilatation. Pancreas: Unremarkable. No pancreatic ductal dilatation or surrounding inflammatory changes. Spleen: Perhaps mild splenomegaly.  Otherwise unremarkable. Adrenals/Urinary Tract: Adrenal glands appear normal. Kidneys are unremarkable without mass, stone or hydronephrosis. No ureteral or bladder calculi are identified. Bladder is decompressed by Foley catheter. Stomach/Bowel: No dilated large or small bowel loops. Stomach is unremarkable. Vascular/Lymphatic: No acute appearing vascular abnormality. No enlarged lymph nodes are seen. Reproductive: Prostate gland is unremarkable. Other: Moderate to large ascites, similar to the previous CT of 09/18/2020. No new fluid collection or abscess. No free intraperitoneal air. Musculoskeletal: No acute appearing osseous abnormality. Stable position of the LEFT inguinal approach surgical drain with pigtail terminating at the site of a previously demonstrated fluid collection in the LEFT lower psoas muscle. This collection has resolved in the interval. The additional complex fluid collection within the iliacus muscle is also significantly improved and nearly resolved, perhaps a small residual component overlying the LEFT SI joint (series 5, image 81). Persistent anasarca without significant change. CT LEFT FEMUR FINDINGS: Interval surgical evacuation of the abscess  collection in the medial LEFT thigh. No persistent fluid collection is seen, with expected postsurgical changes of the overlying soft tissues and with associated packing in place at the overlying skin defect. Grossly stable abscess collection anterior to the upper LEFT femur, measuring 3.3 cm (series 11, image 150). The larger fluid collection seen at this level on the previous study is no longer identified status post the interval incision and drainage. The drainage catheters have been removed from the upper thigh. No new fluid collection or abscess-like collection is identified within the soft tissues about the LEFT femur. Soft tissues about the LEFT knee are unremarkable. No hemorrhage or contrast extravasation is seen within the soft tissues about the LEFT femur. Osseous femur itself appears intact and normal in mineralization. No cortical defect, lucency or destructive change to suggest associated osteomyelitis. IMPRESSION: 1. Stable position of the LEFT inguinal approach surgical drain with pigtail terminating at the site of a previously demonstrated fluid collection in the LEFT lower psoas muscle. This psoas muscle collection has resolved in the interval. 2. The additional complex fluid collection within the iliacus muscle is also significantly improved and nearly resolved, perhaps a small residual component overlying the LEFT SI joint. 3. Moderate to large ascites, similar to the previous CT of 09/18/2020. No new fluid collection or abscess.  4. Cirrhotic-appearing liver. 5. Largest abscess collections within the medial aspect of the LEFT thigh are no longer seen status post interval incision and drainage. Expected postsurgical changes of the overlying soft tissues. No evidence of surgical complicating feature. 6. Stable smaller abscess collection anterior to the LEFT femur, measuring 3.3 cm greatest dimension. 7. Osseous femur appears intact and normal in mineralization. No evidence of associated  osteomyelitis. Electronically Signed   By: Bary Richard M.D.   On: 09/21/2020 17:42   CT FEMUR LEFT W CONTRAST  Result Date: 09/21/2020 CLINICAL DATA:  Abdominal abscess/infection suspected. Status post orthopedic surgery on 09/15/2020. LEFT thigh incision and drainage on 09/20/2020. EXAM: CT ABDOMEN AND PELVIS WITH CONTRAST CT LEFT FEMUR WITH CONTRAST TECHNIQUE: Multidetector CT imaging of the abdomen and pelvis was performed using the standard protocol following bolus administration of intravenous contrast. CONTRAST:  OMNIPAQUE IOHEXOL 300 MG/ML  SOLN COMPARISON:  CT abdomen pelvis dated 09/18/2020. CT LEFT femur dated 09/18/2020. FINDINGS: CT abdomen AND PELVIS FINDINGS: Lower chest: Mild bibasilar atelectasis. Hepatobiliary: Cirrhotic-appearing liver. No focal mass or lesion is seen within the liver. Gallbladder is unremarkable. No bile duct dilatation. Pancreas: Unremarkable. No pancreatic ductal dilatation or surrounding inflammatory changes. Spleen: Perhaps mild splenomegaly.  Otherwise unremarkable. Adrenals/Urinary Tract: Adrenal glands appear normal. Kidneys are unremarkable without mass, stone or hydronephrosis. No ureteral or bladder calculi are identified. Bladder is decompressed by Foley catheter. Stomach/Bowel: No dilated large or small bowel loops. Stomach is unremarkable. Vascular/Lymphatic: No acute appearing vascular abnormality. No enlarged lymph nodes are seen. Reproductive: Prostate gland is unremarkable. Other: Moderate to large ascites, similar to the previous CT of 09/18/2020. No new fluid collection or abscess. No free intraperitoneal air. Musculoskeletal: No acute appearing osseous abnormality. Stable position of the LEFT inguinal approach surgical drain with pigtail terminating at the site of a previously demonstrated fluid collection in the LEFT lower psoas muscle. This collection has resolved in the interval. The additional complex fluid collection within the iliacus muscle is  also significantly improved and nearly resolved, perhaps a small residual component overlying the LEFT SI joint (series 5, image 81). Persistent anasarca without significant change. CT LEFT FEMUR FINDINGS: Interval surgical evacuation of the abscess collection in the medial LEFT thigh. No persistent fluid collection is seen, with expected postsurgical changes of the overlying soft tissues and with associated packing in place at the overlying skin defect. Grossly stable abscess collection anterior to the upper LEFT femur, measuring 3.3 cm (series 11, image 150). The larger fluid collection seen at this level on the previous study is no longer identified status post the interval incision and drainage. The drainage catheters have been removed from the upper thigh. No new fluid collection or abscess-like collection is identified within the soft tissues about the LEFT femur. Soft tissues about the LEFT knee are unremarkable. No hemorrhage or contrast extravasation is seen within the soft tissues about the LEFT femur. Osseous femur itself appears intact and normal in mineralization. No cortical defect, lucency or destructive change to suggest associated osteomyelitis. IMPRESSION: 1. Stable position of the LEFT inguinal approach surgical drain with pigtail terminating at the site of a previously demonstrated fluid collection in the LEFT lower psoas muscle. This psoas muscle collection has resolved in the interval. 2. The additional complex fluid collection within the iliacus muscle is also significantly improved and nearly resolved, perhaps a small residual component overlying the LEFT SI joint. 3. Moderate to large ascites, similar to the previous CT of 09/18/2020. No new fluid  collection or abscess. 4. Cirrhotic-appearing liver. 5. Largest abscess collections within the medial aspect of the LEFT thigh are no longer seen status post interval incision and drainage. Expected postsurgical changes of the overlying soft  tissues. No evidence of surgical complicating feature. 6. Stable smaller abscess collection anterior to the LEFT femur, measuring 3.3 cm greatest dimension. 7. Osseous femur appears intact and normal in mineralization. No evidence of associated osteomyelitis. Electronically Signed   By: Bary Richard M.D.   On: 09/21/2020 17:42   ECHO TEE  Result Date: 09/19/2020    TRANSESOPHOGEAL ECHO REPORT   Patient Name:   [name] Date of Exam: 09/19/2020 Medical Rec #:  [id number]         Height:       70.0 in Accession #:    [id number]        Weight:       215.6 lb Date of Birth:  1985-12-22         BSA:          2.155 m Patient Age:    34 years          BP:           106/66 mmHg Patient Gender: M                 HR:           104 bpm. Exam Location:  Inpatient Procedure: Transesophageal Echo, Color Doppler and Saline Contrast Bubble Study Indications:     Bacteremia  History:         Patient has prior history of Echocardiogram examinations, most                  recent 09/14/2020. Signs/Symptoms:Shortness of Breath; Risk                  Factors:Hypertension. ETOH.  Sonographer:     Ross Ludwig RDCS (AE) Referring Phys:  [address] [place] Diagnosing Phys: Dietrich Pates MD PROCEDURE: After discussion of the risks and benefits of a TEE, an informed consent was obtained from the patient. The transesophogeal probe was passed without difficulty through the esophogus of the patient. Local oropharyngeal anesthetic was provided with Cetacaine. Sedation performed by different physician. The patient was monitored while under deep sedation. Anesthestetic sedation was provided intravenously by Anesthesiology: 227.23mg  of Propofol. Image quality was good. The patient developed no complications during the procedure. IMPRESSIONS  1. No obvious vegetations.  2. Left ventricular ejection fraction, by estimation, is 60 to 65%. The left ventricle has normal function.  3. Right ventricular systolic function is normal. The right  ventricular size is normal.  4. No left atrial/left atrial appendage thrombus was detected.  5. The mitral valve is normal in structure. Trivial mitral valve regurgitation.  6. The aortic valve is normal in structure. Aortic valve regurgitation is not visualized.  7. Agitated saline contrast bubble study was negative, with no evidence of any interatrial shunt. FINDINGS  Left Ventricle: Left ventricular ejection fraction, by estimation, is 60 to 65%. The left ventricle has normal function. The left ventricular internal cavity size was normal in size. Right Ventricle: The right ventricular size is normal. Right ventricular systolic function is normal. Left Atrium: Left atrial size was normal in size. No left atrial/left atrial appendage thrombus was detected. Right Atrium: Right atrial size was normal in size. Pericardium: There is no evidence of pericardial effusion. Mitral Valve: The mitral valve is normal in structure. Trivial  mitral valve regurgitation. Tricuspid Valve: The tricuspid valve is normal in structure. Tricuspid valve regurgitation is trivial. Aortic Valve: The aortic valve is normal in structure. Aortic valve regurgitation is not visualized. Pulmonic Valve: The pulmonic valve was normal in structure. Pulmonic valve regurgitation is not visualized. Aorta: The aortic root and ascending aorta are structurally normal, with no evidence of dilitation. IAS/Shunts: No atrial level shunt detected by color flow Doppler. Agitated saline contrast was given intravenously to evaluate for intracardiac shunting. Agitated saline contrast bubble study was negative, with no evidence of any interatrial shunt. Dietrich Pates MD Electronically signed by Dietrich Pates MD Signature Date/Time: 09/19/2020/4:02:21 PM    Final     Labs:  CBC: Recent Labs    09/18/20 0303 09/19/20 0305 09/20/20 0244 09/21/20 0234 09/21/20 1117  WBC 15.1* 14.5* 10.7* 11.9*  --   HGB 9.8* 8.8* 7.7* 6.4* 7.8*  HCT 30.2* 26.6* 24.0* 20.3* 23.5*   PLT 86* 85* 99* 104*  --     COAGS: Recent Labs    08/22/20 1817 08/25/20 0349 09/13/20 1045 09/14/20 0525 09/15/20 2300 09/19/20 0305  INR 1.4*   < > 1.4* 1.5* 2.2* 1.5*  APTT 42*  --   --   --   --   --    < > = values in this interval not displayed.    BMP: Recent Labs    09/19/20 0305 09/20/20 0244 09/21/20 0234 09/23/20 0243  NA 129* 131* 126* 127*  K 4.4 4.3 4.8 5.0  CL 96* 95* 95* 96*  CO2 24 23 23 23   GLUCOSE 100* 96 173* 142*  BUN 14 13 10 11   CALCIUM 7.4* 7.4* 7.1* 7.5*  CREATININE 0.41* 0.43* 0.34* 0.44*  GFRNONAA >60 >60 >60 >60    LIVER FUNCTION TESTS: Recent Labs    09/19/20 0305 09/20/20 0244 09/21/20 0234 09/23/20 0243  BILITOT 1.7* 1.6* 1.5* 1.0  AST 39 30 30 31   ALT 16 13 11 9   ALKPHOS 244* 240* 214* 211*  PROT 5.2* 5.2* 4.8* 5.2*  ALBUMIN 2.0* 1.9* 1.7* 1.9*    Assessment and Plan: Pt with history of left hip septic arthritis/AVN complicated by development of left retroperitoneal and left thigh fluid collections concerning for abscesses s/p drain placement x3 (left retroperitoneal, medial left thigh, lateral left thigh) in IR 09/14/2020; s/p removal of left retroperitoneal drain in OR 09/20/2020; with repeat CT revealing persistent left anterior thigh fluid collection concerning for abscess.Afebrile; previous cx with staph aureus, bacteroides, prevotella bivia.  Request received for image guided aspiration/drainage of remaining left thigh collection today. Risks and benefits discussed with the patient/girlfriend including bleeding, infection, damage to adjacent structures, and sepsis.  All of the patient's questions were answered, patient is agreeable to proceed. Consent signed and in chart.     Electronically Signed: D. , PA-C 09/23/2020, 9:26 AM   I spent a total of 15 minutes at the the patient's bedside AND on the patient's hospital floor or unit, greater than 50% of which was counseling/coordinating care for left thigh  abscess drains    Patient ID: 09/[id number], male   DOB: 09-25-1985, 35 y.o.   MRN: [id number]

## 2020-09-24 DIAGNOSIS — R52 Pain, unspecified: Secondary | ICD-10-CM

## 2020-09-24 DIAGNOSIS — R7881 Bacteremia: Secondary | ICD-10-CM | POA: Diagnosis not present

## 2020-09-24 DIAGNOSIS — L02416 Cutaneous abscess of left lower limb: Secondary | ICD-10-CM | POA: Diagnosis not present

## 2020-09-24 DIAGNOSIS — M60009 Infective myositis, unspecified site: Secondary | ICD-10-CM | POA: Diagnosis not present

## 2020-09-24 DIAGNOSIS — D649 Anemia, unspecified: Secondary | ICD-10-CM

## 2020-09-24 DIAGNOSIS — M25552 Pain in left hip: Secondary | ICD-10-CM

## 2020-09-24 LAB — COMPREHENSIVE METABOLIC PANEL
ALT: 9 U/L (ref 0–44)
AST: 32 U/L (ref 15–41)
Albumin: 1.9 g/dL — ABNORMAL LOW (ref 3.5–5.0)
Alkaline Phosphatase: 203 U/L — ABNORMAL HIGH (ref 38–126)
Anion gap: 7 (ref 5–15)
BUN: 12 mg/dL (ref 6–20)
CO2: 24 mmol/L (ref 22–32)
Calcium: 7.4 mg/dL — ABNORMAL LOW (ref 8.9–10.3)
Chloride: 94 mmol/L — ABNORMAL LOW (ref 98–111)
Creatinine, Ser: 0.41 mg/dL — ABNORMAL LOW (ref 0.61–1.24)
GFR, Estimated: >60 mL/min (ref >60)
Glucose, Bld: 112 mg/dL — ABNORMAL HIGH (ref 70–99)
Potassium: 4.7 mmol/L (ref 3.5–5.1)
Sodium: 125 mmol/L — ABNORMAL LOW (ref 135–145)
Total Bilirubin: 0.9 mg/dL (ref 0.3–1.2)
Total Protein: 5 g/dL — ABNORMAL LOW (ref 6.5–8.1)

## 2020-09-24 LAB — CBC
HCT: 21.9 % — ABNORMAL LOW (ref 39.0–52.0)
Hemoglobin: 7.2 g/dL — ABNORMAL LOW (ref 13.0–17.0)
MCH: 33.5 pg (ref 26.0–34.0)
MCHC: 32.9 g/dL (ref 30.0–36.0)
MCV: 101.9 fL — ABNORMAL HIGH (ref 80.0–100.0)
Platelets: 145 10*3/uL — ABNORMAL LOW (ref 150–400)
RBC: 2.15 MIL/uL — ABNORMAL LOW (ref 4.22–5.81)
RDW: 17.5 % — ABNORMAL HIGH (ref 11.5–15.5)
WBC: 18.5 10*3/uL — ABNORMAL HIGH (ref 4.0–10.5)
nRBC: 0 % (ref 0.0–0.2)

## 2020-09-24 NOTE — Progress Notes (Signed)
Referring Physician(s): Cornett,T/Lama,G  Supervising Physician: Marliss Coots  Patient Status:  [facility] - In-pt  Chief Complaint: Left hip/thigh pain/abscesses   Subjective: Pt without acute changes; still having some left thigh/hip discomfort as expected   Allergies: Patient has no active allergies.  Medications: Prior to Admission medications   Medication Sig Start Date End Date Taking? Authorizing Provider  carvedilol (COREG) 3.125 MG tablet Take 3.125 mg by mouth 2 (two) times daily. 07/28/20  Yes [provider]  folic acid (FOLVITE) 1 MG tablet Take 1 tablet (1 mg total) by mouth daily. 08/30/20  Yes Pokhrel, Laxman, MD  guaiFENesin-dextromethorphan (ROBITUSSIN DM) 100-10 MG/5ML syrup Take 5 mLs by mouth every 4 (four) hours as needed for cough (chest congestion). Patient taking differently: Take 10 mLs by mouth every 4 (four) hours as needed for cough (chest congestion). 08/29/20  Yes Pokhrel, Laxman, MD  lactulose (CHRONULAC) 10 GM/15ML solution Take 15 mLs (10 g total) by mouth 2 (two) times daily. 08/29/20  Yes Pokhrel, Laxman, MD  magnesium oxide (MAG-OX) 400 MG tablet Take 1 tablet by mouth daily. 07/28/20  Yes [provider]  pantoprazole (PROTONIX) 40 MG tablet Take 1 tablet (40 mg total) by mouth 2 (two) times daily before a meal for 28 days, THEN 1 tablet (40 mg total) daily for 28 days. 08/29/20 10/24/20 Yes Pokhrel, Laxman, MD  prednisoLONE (ORAPRED) 15 MG/5ML solution Take 1.6-13.3 mLs by mouth See admin instructions. Take 13.38ml by mouth daily for 28 days, 35ml for 4 days, 6.35ml daily for 4 days, 3.3 daily for 4 days, then 1.50ml daily for 3 days. 08/29/20  Yes [provider]  sertraline (ZOLOFT) 25 MG tablet Take 25 mg by mouth at bedtime. 08/20/20  Yes [provider]  thiamine 100 MG tablet Take 1 tablet (100 mg total) by mouth daily. 08/30/20  Yes Pokhrel, Laxman, MD  buPROPion (WELLBUTRIN XL) 300 MG 24 hr tablet Take 1 tablet (300 mg total)  by mouth daily. Patient not taking: No sig reported 10/16/13   Rachael Fee, MD  carbamazepine (TEGRETOL XR) 200 MG 12 hr tablet Take 1 tablet (200 mg total) by mouth at bedtime. Patient not taking: No sig reported 10/16/13   Rachael Fee, MD  hydrOXYzine (ATARAX/VISTARIL) 25 MG tablet Take 1 tablet (25 mg total) by mouth every 6 (six) hours as needed for anxiety (or CIWA score </= 10). Patient not taking: No sig reported 10/16/13   Rachael Fee, MD  lisinopril-hydrochlorothiazide (ZESTORETIC) 20-25 MG tablet Take 1 tablet by mouth daily. Patient not taking: No sig reported 08/31/20   Pokhrel, Rebekah [place], MD  oxyCODONE (ROXICODONE) 5 MG immediate release tablet Take 1 tablet (5 mg total) by mouth every 8 (eight) hours as needed. Patient not taking: No sig reported 08/29/20 08/29/21  Pokhrel, Rebekah [place], MD  prednisoLONE 5 MG TABS tablet Take 8 tablets (40 mg total) by mouth daily for 28 days, THEN 6 tablets (30 mg total) daily for 4 days, THEN 4 tablets (20 mg total) daily for 4 days, THEN 2 tablets (10 mg total) daily for 4 days, THEN 1 tablet (5 mg total) every 3 (three) days for 1 day. Patient not taking: Reported on 09/13/2020 08/29/20 10/09/20  Joycelyn Das, MD  traMADol (ULTRAM) 50 MG tablet Take 1 tablet (50 mg total) by mouth every 6 (six) hours as needed. Patient not taking: Reported on 09/13/2020 08/29/20   Joycelyn Das, MD     Vital Signs: BP 107/63   Pulse Marland Kitchen)  110   Temp 100.3 F (37.9 C) (Oral)   Resp 20   Ht 5\' 10"  (1.778 m)   Wt 207 lb 0.2 oz (93.9 kg)   SpO2 96%   BMI 29.70 kg/m   Physical Exam awake/alert; left thigh/LLQ drains intact, dressings dry, OP minimal to 20 cc blood-tinged fluid with some tissue fragments; scrotal edema noted  Imaging: CT ABDOMEN PELVIS W CONTRAST  Result Date: 09/21/2020 CLINICAL DATA:  Abdominal abscess/infection suspected. Status post orthopedic surgery on 09/15/2020. LEFT thigh incision and drainage on 09/20/2020. EXAM: CT ABDOMEN AND PELVIS WITH  CONTRAST CT LEFT FEMUR WITH CONTRAST TECHNIQUE: Multidetector CT imaging of the abdomen and pelvis was performed using the standard protocol following bolus administration of intravenous contrast. CONTRAST:  11/20/2020 OMNIPAQUE IOHEXOL 300 MG/ML  SOLN COMPARISON:  CT abdomen pelvis dated 09/18/2020. CT LEFT femur dated 09/18/2020. FINDINGS: CT abdomen AND PELVIS FINDINGS: Lower chest: Mild bibasilar atelectasis. Hepatobiliary: Cirrhotic-appearing liver. No focal mass or lesion is seen within the liver. Gallbladder is unremarkable. No bile duct dilatation. Pancreas: Unremarkable. No pancreatic ductal dilatation or surrounding inflammatory changes. Spleen: Perhaps mild splenomegaly.  Otherwise unremarkable. Adrenals/Urinary Tract: Adrenal glands appear normal. Kidneys are unremarkable without mass, stone or hydronephrosis. No ureteral or bladder calculi are identified. Bladder is decompressed by Foley catheter. Stomach/Bowel: No dilated large or small bowel loops. Stomach is unremarkable. Vascular/Lymphatic: No acute appearing vascular abnormality. No enlarged lymph nodes are seen. Reproductive: Prostate gland is unremarkable. Other: Moderate to large ascites, similar to the previous CT of 09/18/2020. No new fluid collection or abscess. No free intraperitoneal air. Musculoskeletal: No acute appearing osseous abnormality. Stable position of the LEFT inguinal approach surgical drain with pigtail terminating at the site of a previously demonstrated fluid collection in the LEFT lower psoas muscle. This collection has resolved in the interval. The additional complex fluid collection within the iliacus muscle is also significantly improved and nearly resolved, perhaps a small residual component overlying the LEFT SI joint (series 5, image 81). Persistent anasarca without significant change. CT LEFT FEMUR FINDINGS: Interval surgical evacuation of the abscess collection in the medial LEFT thigh. No persistent fluid collection is  seen, with expected postsurgical changes of the overlying soft tissues and with associated packing in place at the overlying skin defect. Grossly stable abscess collection anterior to the upper LEFT femur, measuring 3.3 cm (series 11, image 150). The larger fluid collection seen at this level on the previous study is no longer identified status post the interval incision and drainage. The drainage catheters have been removed from the upper thigh. No new fluid collection or abscess-like collection is identified within the soft tissues about the LEFT femur. Soft tissues about the LEFT knee are unremarkable. No hemorrhage or contrast extravasation is seen within the soft tissues about the LEFT femur. Osseous femur itself appears intact and normal in mineralization. No cortical defect, lucency or destructive change to suggest associated osteomyelitis. IMPRESSION: 1. Stable position of the LEFT inguinal approach surgical drain with pigtail terminating at the site of a previously demonstrated fluid collection in the LEFT lower psoas muscle. This psoas muscle collection has resolved in the interval. 2. The additional complex fluid collection within the iliacus muscle is also significantly improved and nearly resolved, perhaps a small residual component overlying the LEFT SI joint. 3. Moderate to large ascites, similar to the previous CT of 09/18/2020. No new fluid collection or abscess. 4. Cirrhotic-appearing liver. 5. Largest abscess collections within the medial aspect of the LEFT thigh are  no longer seen status post interval incision and drainage. Expected postsurgical changes of the overlying soft tissues. No evidence of surgical complicating feature. 6. Stable smaller abscess collection anterior to the LEFT femur, measuring 3.3 cm greatest dimension. 7. Osseous femur appears intact and normal in mineralization. No evidence of associated osteomyelitis. Electronically Signed   By: Bary RichardStan  Maynard M.D.   On: 09/21/2020  17:42   CT FEMUR LEFT W CONTRAST  Result Date: 09/21/2020 CLINICAL DATA:  Abdominal abscess/infection suspected. Status post orthopedic surgery on 09/15/2020. LEFT thigh incision and drainage on 09/20/2020. EXAM: CT ABDOMEN AND PELVIS WITH CONTRAST CT LEFT FEMUR WITH CONTRAST TECHNIQUE: Multidetector CT imaging of the abdomen and pelvis was performed using the standard protocol following bolus administration of intravenous contrast. CONTRAST:  100mL OMNIPAQUE IOHEXOL 300 MG/ML  SOLN COMPARISON:  CT abdomen pelvis dated 09/18/2020. CT LEFT femur dated 09/18/2020. FINDINGS: CT abdomen AND PELVIS FINDINGS: Lower chest: Mild bibasilar atelectasis. Hepatobiliary: Cirrhotic-appearing liver. No focal mass or lesion is seen within the liver. Gallbladder is unremarkable. No bile duct dilatation. Pancreas: Unremarkable. No pancreatic ductal dilatation or surrounding inflammatory changes. Spleen: Perhaps mild splenomegaly.  Otherwise unremarkable. Adrenals/Urinary Tract: Adrenal glands appear normal. Kidneys are unremarkable without mass, stone or hydronephrosis. No ureteral or bladder calculi are identified. Bladder is decompressed by Foley catheter. Stomach/Bowel: No dilated large or small bowel loops. Stomach is unremarkable. Vascular/Lymphatic: No acute appearing vascular abnormality. No enlarged lymph nodes are seen. Reproductive: Prostate gland is unremarkable. Other: Moderate to large ascites, similar to the previous CT of 09/18/2020. No new fluid collection or abscess. No free intraperitoneal air. Musculoskeletal: No acute appearing osseous abnormality. Stable position of the LEFT inguinal approach surgical drain with pigtail terminating at the site of a previously demonstrated fluid collection in the LEFT lower psoas muscle. This collection has resolved in the interval. The additional complex fluid collection within the iliacus muscle is also significantly improved and nearly resolved, perhaps a small residual  component overlying the LEFT SI joint (series 5, image 81). Persistent anasarca without significant change. CT LEFT FEMUR FINDINGS: Interval surgical evacuation of the abscess collection in the medial LEFT thigh. No persistent fluid collection is seen, with expected postsurgical changes of the overlying soft tissues and with associated packing in place at the overlying skin defect. Grossly stable abscess collection anterior to the upper LEFT femur, measuring 3.3 cm (series 11, image 150). The larger fluid collection seen at this level on the previous study is no longer identified status post the interval incision and drainage. The drainage catheters have been removed from the upper thigh. No new fluid collection or abscess-like collection is identified within the soft tissues about the LEFT femur. Soft tissues about the LEFT knee are unremarkable. No hemorrhage or contrast extravasation is seen within the soft tissues about the LEFT femur. Osseous femur itself appears intact and normal in mineralization. No cortical defect, lucency or destructive change to suggest associated osteomyelitis. IMPRESSION: 1. Stable position of the LEFT inguinal approach surgical drain with pigtail terminating at the site of a previously demonstrated fluid collection in the LEFT lower psoas muscle. This psoas muscle collection has resolved in the interval. 2. The additional complex fluid collection within the iliacus muscle is also significantly improved and nearly resolved, perhaps a small residual component overlying the LEFT SI joint. 3. Moderate to large ascites, similar to the previous CT of 09/18/2020. No new fluid collection or abscess. 4. Cirrhotic-appearing liver. 5. Largest abscess collections within the medial aspect of the  LEFT thigh are no longer seen status post interval incision and drainage. Expected postsurgical changes of the overlying soft tissues. No evidence of surgical complicating feature. 6. Stable smaller abscess  collection anterior to the LEFT femur, measuring 3.3 cm greatest dimension. 7. Osseous femur appears intact and normal in mineralization. No evidence of associated osteomyelitis. Electronically Signed   By: Bary Richard M.D.   On: 09/21/2020 17:42   CT IMAGE GUIDED DRAINAGE BY PERCUTANEOUS CATHETER  Result Date: 09/23/2020 INDICATION: Septic arthritis of the left hip status post multiple IR drain placement. Persistent fluid collection present in the left mid thigh. Interventional radiology consulted for drain placement. EXAM: CT GUIDED DRAINAGE OF LEFT MID THIGH ABSCESS MEDICATIONS: The patient is currently admitted to the hospital and receiving intravenous antibiotics. The antibiotics were administered within an appropriate time frame prior to the initiation of the procedure. ANESTHESIA/SEDATION: 4 mg IV Versed 100 mcg IV Fentanyl Moderate Sedation Time:  23 The patient was continuously monitored during the procedure by the interventional radiology nurse under my direct supervision. COMPLICATIONS: None immediate. TECHNIQUE: Informed written consent was obtained from the patient after a thorough discussion of the procedural risks, benefits and alternatives. All questions were addressed. Maximal Sterile Barrier Technique was utilized including caps, mask, sterile gowns, sterile gloves, sterile drape, hand hygiene and skin antiseptic. A timeout was performed prior to the initiation of the procedure. PROCEDURE: The left mid thigh was prepped with Chlorhexidine in a sterile fashion, and a sterile drape was applied covering the operative field. A sterile gown and sterile gloves were used for the procedure. Local anesthesia was provided with 1% Lidocaine. Patient positioned supine on the procedure table. Following local lidocaine the striation, 18 gauge trocar needle advanced into the left mid thigh fluid collection utilizing CT guidance. The 18 gauge needle was exchanged for 10.2 [place] multipurpose pigtail drain  over 0.035 inch guidewire. Minimal serosanguineous output from the drain. Drain was secured to skin with suture and connected to bulb suction. The patient was transferred to the ultrasound department. Ultrasound examination confirmed appropriate positioning of the drain within the left anterior mid thigh collection. The collection appeared quite heterogeneous on ultrasound examination which explains the minimal output from the drain. FINDINGS: Contrast-enhanced CT of the left mid thigh demonstrated rim enhancing collection in the anterior compartment. IMPRESSION: 10.2 [place] multipurpose pigtail drain placed in left mid thigh collection. Position of drain confirmed both with CT and ultrasound. On ultrasound evaluation the collection appears quite complex which explains the minimal drain output. Electronically Signed   By: Acquanetta Belling M.D.   On: 09/23/2020 16:34    Labs:  CBC: Recent Labs    09/20/20 0244 09/21/20 0234 09/21/20 1117 09/23/20 0939 09/24/20 0912  WBC 10.7* 11.9*  --  14.7* 18.5*  HGB 7.7* 6.4* 7.8* 7.4* 7.2*  HCT 24.0* 20.3* 23.5* 22.7* 21.9*  PLT 99* 104*  --  141* 145*    COAGS: Recent Labs    08/22/20 1817 08/25/20 0349 09/13/20 1045 09/14/20 0525 09/15/20 2300 09/19/20 0305  INR 1.4*   < > 1.4* 1.5* 2.2* 1.5*  APTT 42*  --   --   --   --   --    < > = values in this interval not displayed.    BMP: Recent Labs    09/20/20 0244 09/21/20 0234 09/23/20 0243 09/24/20 0912  NA 131* 126* 127* 125*  K 4.3 4.8 5.0 4.7  CL 95* 95* 96* 94*  CO2 24  GLUCOSE 96 173* 142* 112*  BUN 13 10 11 12   CALCIUM 7.4* 7.1* 7.5* 7.4*  CREATININE 0.43* 0.34* 0.44* 0.41*  GFRNONAA >60 >60 >60 >60    LIVER FUNCTION TESTS: Recent Labs    09/20/20 0244 09/21/20 0234 09/23/20 0243 09/24/20 0912  BILITOT 1.6* 1.5* 1.0 0.9  AST 30 30 31  32  ALT 13 11 9 9   ALKPHOS 240* 214* 211* 203*  PROT 5.2* 4.8* 5.2* 5.0*  ALBUMIN 1.9* 1.7* 1.9* 1.9*    Assessment and  Plan: Pt with history of left hip septic arthritis/AVN complicated by development of left retroperitoneal and left thigh fluid collections concerning for abscesses s/p drain placement x3 (left retroperitoneal, medial left thigh, lateral left thigh) in IR 09/14/2020; s/p removal of left retroperitoneal drain in OR 09/20/2020; with repeat CT revealing persistent left anterior thigh fluid collection concerning for abscess. Temp 100.3, previous cx with staph aureus, bacteroides, prevotella bivia. WBC 18.5(14.7), hgb 7.2(7.4), creat 0.41; cont current tx/close OP/lab monitoring; monitor for assoc DVT; rec f/u CT next week if drain outputs remain low   Electronically Signed: D. , PA-C 09/24/2020, 1:32 PM   I spent a total of 15 minutes at the the patient's bedside AND on the patient's hospital floor or unit, greater than 50% of which was counseling/coordinating care for left thigh abscess drains    Patient ID: [id number], male   DOB: 09-Jul-1986, 35 y.o.   MRN: Macario Golds

## 2020-09-24 NOTE — Progress Notes (Signed)
2 Days Post-Op   Subjective/Chief Complaint: Pt doing well this AM    Objective: Vital signs in last 24 hours: Temp:  [97.5 F (36.4 C)-98.2 F (36.8 C)] 98.2 F (36.8 C) (03/05 0400) Pulse Rate:  [84-111] 111 (03/05 0000) Resp:  [14-24] 23 (03/05 0000) BP: (99-124)/(56-98) 122/72 (03/05 0000) SpO2:  [95 %-100 %] 96 % (03/05 0000) Last BM Date: 09/23/20  Intake/Output from previous day: 03/04 0701 - 03/05 0700 In: 244.9 [IV Piggyback:244.9] Out: 1145 [Urine:625; Drains:520] Intake/Output this shift: No intake/output data recorded.  PE:  Constitutional: No acute distress, conversant, appears states age. Eyes: Anicteric sclerae, moist conjunctiva, no lid lag Lungs: Clear to auscultation bilaterally, normal respiratory effort CV: regular rate and rhythm, no murmurs, no peripheral edema, pedal pulses 2+ GI: Soft, no masses or hepatosplenomegaly, non-tender to palpation Skin: No rashes, palpation reveals normal turgor, inc c/d/i, base of wound clean Psychiatric: appropriate judgment and insight, oriented to person, place, and time   Lab Results:  Recent Labs    09/21/20 1117 09/23/20 0939  WBC  --  14.7*  HGB 7.8* 7.4*  HCT 23.5* 22.7*  PLT  --  141*   BMET Recent Labs    09/23/20 0243  NA 127*  K 5.0  CL 96*  CO2 23  GLUCOSE 142*  BUN 11  CREATININE 0.44*  CALCIUM 7.5*   PT/INR No results for input(s): LABPROT, INR in the last 72 hours. ABG No results for input(s): PHART, HCO3 in the last 72 hours.  Invalid input(s): PCO2, PO2  Studies/Results: CT IMAGE GUIDED DRAINAGE BY PERCUTANEOUS CATHETER  Result Date: 09/23/2020 INDICATION: Septic arthritis of the left hip status post multiple IR drain placement. Persistent fluid collection present in the left mid thigh. Interventional radiology consulted for drain placement. EXAM: CT GUIDED DRAINAGE OF LEFT MID THIGH ABSCESS MEDICATIONS: The patient is currently admitted to the hospital and receiving intravenous  antibiotics. The antibiotics were administered within an appropriate time frame prior to the initiation of the procedure. ANESTHESIA/SEDATION: 4 mg IV Versed 100 mcg IV Fentanyl Moderate Sedation Time:  23 The patient was continuously monitored during the procedure by the interventional radiology nurse under my direct supervision. COMPLICATIONS: None immediate. TECHNIQUE: Informed written consent was obtained from the patient after a thorough discussion of the procedural risks, benefits and alternatives. All questions were addressed. Maximal Sterile Barrier Technique was utilized including caps, mask, sterile gowns, sterile gloves, sterile drape, hand hygiene and skin antiseptic. A timeout was performed prior to the initiation of the procedure. PROCEDURE: The left mid thigh was prepped with Chlorhexidine in a sterile fashion, and a sterile drape was applied covering the operative field. A sterile gown and sterile gloves were used for the procedure. Local anesthesia was provided with 1% Lidocaine. Patient positioned supine on the procedure table. Following local lidocaine the striation, 18 gauge trocar needle advanced into the left mid thigh fluid collection utilizing CT guidance. The 18 gauge needle was exchanged for 10.2 [place] multipurpose pigtail drain over 0.035 inch guidewire. Minimal serosanguineous output from the drain. Drain was secured to skin with suture and connected to bulb suction. The patient was transferred to the ultrasound department. Ultrasound examination confirmed appropriate positioning of the drain within the left anterior mid thigh collection. The collection appeared quite heterogeneous on ultrasound examination which explains the minimal output from the drain. FINDINGS: Contrast-enhanced CT of the left mid thigh demonstrated rim enhancing collection in the anterior compartment. IMPRESSION: 10.2 [place] multipurpose pigtail drain placed in left  mid thigh collection. Position of drain confirmed  both with CT and ultrasound. On ultrasound evaluation the collection appears quite complex which explains the minimal drain output. Electronically Signed   By: Acquanetta Belling M.D.   On: 09/23/2020 16:34    Anti-infectives: Anti-infectives (From admission, onward)   Start     Dose/Rate Route Frequency Ordered Stop   09/18/20 1400  metroNIDAZOLE (FLAGYL) tablet 500 mg        500 mg Oral Every 8 hours 09/18/20 0927     09/15/20 1715  tobramycin (NEBCIN) powder  Status:  Discontinued          As needed 09/15/20 1715 09/15/20 2043   09/15/20 1715  vancomycin (VANCOCIN) powder  Status:  Discontinued          As needed 09/15/20 1716 09/15/20 2043   09/14/20 0600  ceFAZolin (ANCEF) IVPB 2g/100 mL premix        2 g 200 mL/hr over 30 Minutes Intravenous Every 8 hours 09/14/20 0359     09/13/20 2200  vancomycin (VANCOREADY) IVPB 1500 mg/300 mL  Status:  Discontinued        1,500 mg 150 mL/hr over 120 Minutes Intravenous Every 12 hours 09/13/20 1636 09/14/20 0359   09/13/20 1500  ceFEPIme (MAXIPIME) 2 g in sodium chloride 0.9 % 100 mL IVPB  Status:  Discontinued        2 g 200 mL/hr over 30 Minutes Intravenous Every 8 hours 09/13/20 1437 09/14/20 0359   09/13/20 1130  vancomycin (VANCOREADY) IVPB 1750 mg/350 mL        1,750 mg 175 mL/hr over 120 Minutes Intravenous  Once 09/13/20 1107 09/13/20 1414   09/13/20 1045  ceFAZolin (ANCEF) IVPB 1 g/50 mL premix        1 g 100 mL/hr over 30 Minutes Intravenous  Once 09/13/20 1031 09/13/20 1307      Assessment/Plan: Symptomatic anemia  Sinus tachycardia, chronic  Thrombocytopenia - Plts 104 today, monitor PMH alcoholic hepatitis Hepatic cirrhosis with ascites GAD GERD Anemia - hgb 7.4   PMH AVN  Septic shock due to below on admission -now resolved s/p below procedures and IV abx MSSA bacteremia Septic arthritis of the left hip Iliopsoas and left proximal and medial thigh abscesses  09/14/20 Placement 14 F IR perc drain into left RP abscess,  two 12 F drains placed in L thigh (Cx - staph) 09/15/20 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 Sena Slate 09/20/20 Left thigh irrigation and debridement with wound vac placement, Dr. Dion Saucier   -start BID dressing changes to left groin wound starting today.  If this is clean enough, next week when he gets his thigh VAC changed, WOC may be able to place a VAC on the groin wound and connect via a Y connector.  Will assess at that time. -WBC trending up  FEN - regular diet VTE - none currently ID - flagyl, ancef   LOS: 11 days    [name] 09/24/2020

## 2020-09-24 NOTE — Progress Notes (Signed)
Physical Therapy Treatment Patient Details Name: [name] MRN: [id number] DOB: 03-Feb-1986 Today's Date: 09/24/2020    History of Present Illness [name] is a 35 y.o. male with medical history significant of AVN of hip ( originally was scheduled for a THA this coming March2022), HTN, GERD. Recent admission for COVID , acute renal failure and alcoholic hepatitis (08/22/20- 08/29/20). He was ok for a few days after discharge, but he began to feel very tired, weak, left thigh and leg pain and progressed over the last 2 weeks. He reports that the left thigh had become increasingly swollen and tender to touch. He has had a blister rupture on the inner thigh in the groin area. These symptoms increased through 2 days ago when he was no longer ago to walk without great pain. 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 Left thigh I & D and wound vac placement 3/1. I&D groin abcess by general surgery 09/23/19. Additional drain placed per IR 09/23/20    PT Comments    Pt agreeable to EOB with much encouragement from family and PT. incr sitting/activity tolerance. Continues to be limited by pain (was medicated prior to session). Overall incr ability to  self assist today. Encouraged pt to use Tband and work on  LLE ROM as well. Continue PT in acute setting  Follow Up Recommendations  CIR     Equipment Recommendations  Rolling walker with 5" wheels;3in1 (PT)    Recommendations for Other Services       Precautions / Restrictions Precautions Precautions: Fall;Posterior Hip Precaution Comments: monitor HR, pt rests in 100-110's; 2 drains+1 JP drain and wound VAC L  thigh Restrictions LLE Weight Bearing: Weight bearing as tolerated    Mobility  Bed Mobility Overal bed mobility: Needs Assistance Bed Mobility: Supine to Sit;Sit to Supine     Supine to sit: Mod assist;+2 for physical assistance;+2 for safety/equipment;HOB elevated Sit to supine: Mod assist;+2 for physical assistance;+2 for safety/equipment   General bed mobility comments: Mod assist for LLE, assistance with trunk lift off and use of bed pad to pivot hips to edge of bed. Patient able to tolerate approx 10 min edge of bed. Lareral scooting along EOB x3 using bed pad to assist. assist to lower trunk and lift bil LEs on to bed for return to supine.  Scooted towards head of bed self assist with  use of UEs, RLE and PT/tech using bed pad to assist as well    Transfers                    Ambulation/Gait                 Stairs             Wheelchair Mobility    Modified Rankin (Stroke Patients Only)       Balance                                            Cognition Arousal/Alertness: Awake/alert Behavior During Therapy: WFL for tasks assessed/performed Overall Cognitive Status: Within Functional Limits for tasks  assessed                                 General Comments: pt significant other present and very encouraging (pt attempted to refuse however unable with support of his partner)      Exercises Total Joint Exercises Ankle Circles/Pumps: AROM;10 reps;Both;Supine    General Comments        Pertinent Vitals/Pain Pain Assessment: Faces Faces Pain Scale: Hurts whole lot Pain Location: Lt hip and scrotal edema Pain Descriptors / Indicators: Discomfort;Grimacing;Operative site guarding;Guarding Pain Intervention(s): Limited activity within patient's tolerance;Monitored during session;Repositioned;Relaxation    Home Living                      Prior Function            PT Goals (current goals can  now be found in the care plan section) Acute Rehab PT Goals Patient Stated Goal: to be able to walk again and more independent PT Goal Formulation: With patient Time For Goal Achievement: 09/30/20 Potential to Achieve Goals: Good Progress towards PT goals: Progressing toward goals    Frequency    Min 4X/week      PT Plan Current plan remains appropriate    Co-evaluation              AM-PAC PT "6 Clicks" Mobility   Outcome Measure  Help needed turning from your back to your side while in a flat bed without using bedrails?: A Lot Help needed moving from lying on your back to sitting on the side of a flat bed without using bedrails?: A Lot Help needed moving to and from a bed to a chair (including a wheelchair)?: Total Help needed standing up from a chair using your arms (e.g., wheelchair or bedside chair)?: Total Help needed to walk in hospital room?: Total   6 Click Score: 7    End of Session   Activity Tolerance: Patient tolerated treatment well;Patient limited by pain Patient left: in bed;with call bell/phone within reach;with family/visitor present Nurse Communication: Mobility status PT Visit Diagnosis: Other abnormalities of gait and mobility (R26.89);Muscle weakness (generalized) (M62.81);Pain Pain - Right/Left: Left Pain - part of body: Hip     Time: 1610-9604 PT Time Calculation (min) (ACUTE ONLY): 24 min  Charges:  $Therapeutic Activity: 23-37 mins                     Delice Bison, PT  Acute Rehab Dept ([facility]) [phone] Pager [phone]  09/24/2020    [facility] 09/24/2020, 1:29 PM

## 2020-09-24 NOTE — Progress Notes (Signed)
PROGRESS NOTE    [name]  XBW:[id number] DOB: 08-13-1985 DOA: 09/13/2020 PCP: [name], PA   Brief Narrative:   Patient is a 35 yo male with past medical historysignificant for AVN of the hip, HTN, and GERD. Patient was recently admitted on 08/22/20 for COVID-19 and was discharge on 08/29/20. About two days post discharge, patient began to feel extremely weak and tired with symptoms progressively worsening over the next two weeks. During this time. He also developed left leg thigh and legs pain along with swelling and tenderness to touch. He also developed fluid filled blisters and skin sloughing on the medial portion of his left thigh extending into his left groin. Symptoms escalated in intensity until his was unable to walk without significant pain. Patient saw his PCP on 09/12/20 for a lab draw and was called by his PCP office to go to the ED immediately.  ED Course: Hgb 6.2 with 2U PRBC's given, FOBT negative. CT abdomen showedleft iliopsoas and left gluteal abscesses w/ diffuse swelling and left hip effusion. CCS was consulted. Patient was started on cefazolin and vanc.  Interim History: Blood cultures positive for MSSA bacteremia with ID consulted, repeat blood cultures, and transesophageal echo obtained. He underwent 3 drain placement by IRfor abscessesinhis iliopsoas muscle and multiple abscesses in the legs.  On 09/15/20, he had a lefthip Interpositionarthoplastywithantibiotic spacer placement and debridementand developeddecompensated septic shockwith refractoryhypotension despite 2 unit PRBCs, 2.2 L of crystalloid and 500 mL of albumin.He was admitted to the ICU with vasopressor support. He is now hemodynamically stableon TRH service.  Patient had left groin I/Don 09/22/20 and an additional aspiration and drainage on 09/23/20.   Assessment & Plan:   Principal Problem:   MSSA bacteremia Active Problems:   Alcohol dependence ([place])   Alcoholic  hepatitis   Sepsis ([place])   Septic arthritis ([place])   Iliopsoas abscess on left [facility])  #MSSA bacteremia/Left hip septic arthritis with iliopsoas, left gluteal, and left medial thigh abscess/Left hip effusion and septic arthritis in the setting of avascular necrosis -Initially started onIV vanc,cefepime,but changed to IV Cefazolin by ID -Initial Blood Cx 09/13/20 showed Staph Aureus inboth sets of Cx's, transthoracic echo negative for vegetation, normal valvular and systolic function -Sepsis Criteria met with: tachycardia, tachypnea, lactic acid 3.3, and Leukocytosis was 10.6 source: abscesses as above as well as bacteremia and went into septic shock this admission. -Repeat blood cultures negative, continue course of cefazolin per ID. -2/28/22TEEshowednormalvalves and no evidence of vegetation. - Wound cultures positive for S. Aureus, bacteroides, Prevotella - WBC's 15.1->14.5->10.7 (09/20/20)->11.9->14.7->18.5, initially trending down, now increasing in the setting of recent aspiration and drain procedures, continue to trend. - Continues to have pain but less severe, on day40mtrinidazole, day 10cefazolin - ID considering possible PICC placement given prolonged 6 week course of IV cefazolin -2/22 CT Abdomen shows interval development of innumerable fluid collections are seen within the musculature of the left ileopsoas muscle as well as the musculature of the visualized portion of the left gluteal region and proximal left thigh consistent with innumerable abscesses -2/24left hip total arthroplasty with cement interposition spacer  -3/1left thigh irrigation and debridement with wound vac placement-post op day4 -3/3  left groin I/D  -3/4 image guided aspiration/drainage of remaining left thigh collection.   -Wound care orders, per surgery.  Anasarca in setting of IVF resuscitation, MSSA bacteremia, sepsis, and hepatic cirrhosis -Scrotal edema, and left groin and leg swelling  persists -Scrotal U/SMarked asymmetric left scrotal wall thickening and fluid, with increased  blood flow.Which is consistent with soft tissue infection. -IVF stopped with IV antibiotics as above  Scrotal edema, likely secondary to soft tissue infection and IVF resuscitation -U/S negative for abscess, IVF stopped -Scrotal sling ordered  Symptommatic anemia/macrocytic anemia -On admission H/H 6.2/18.9 s/p6U PRBC's total this admission, currently7.2/21.9,asymptomatic,FOBC negative -Anemia panel: iron level of 44, UIBC 131, TIBC 175, saturation ratios of 25% -Approximately400 ml sanguinous drainage from wound vacO/N -Transfused 1Uon3/2/22for Hgb < 7, trend H/Hpost transfusion  Sepsis/Metabolic Acidosis/Electrolyte imbalances (Hyponatremia and Hypophosphatemia) -Sepsis and metabolic acidosis resolved, anion gap closed, d/c IVF due to anasarca -Patient's phosphorus level is2.1, following repletion on 3/1 withIV Sodium Phos 20 mmol -Sodium is now125, replete withSodium Phos IV, and further diurese with Lasix  Hx ofAlcoholism/Alcoholic Hepatitis/HepaticCirrhosis,Hyperbilirubinemia/Mildly abnormal AST -2/22 CT Abdomen consistent with hepatic cirrhosis -Initially placed on prednisolone,96m through 09/26/20, followed by a taper. Per GI givendisseminated MSSA bacteremia taper discontinued -Per patient drinks 12-18 beers weekly -Liver panel: total Bili 1.5improved from peak of 3.2, albumin 1.7, alk phos 214, AST 46->30,ammonia 45 -Continue lactulose 10 g p.o. twice daily, thiamine, magnesium -Patient and family report he has been alcohol free for 1 month, counsel oncontinuedcessation  Difficulty voiding rt fluid volume overload -Patient current has Foley and was diuresed with 40 mg IV Lasix over the past several days -Foley d/c, has voided  Tachycardia, hypertension, currently sinus tach rate average ~110 -Continue Carvedilol 3.125 mg p.o. twice daily  Pain  control -Oxycodoneprn, discontinued, now on Dilaudid 1 mg IV q4h  Hypomagnesia -Repleted, currently 1.7 -Continue to trend.  Protein Malnutrition, mild -Per dietitian, patient's girlfriend to bring in [place] which he was consuming PTA  Thrombocytopenia, in setting of cirrhosis -No s/s of bleeding Plts 145,000 down from 170,000  GAD -Continue sertraline 25 mg p.o. nightly  GERD -C/w Pantoprazole 40 mg p.o. twice daily     DVT prophylaxis: SCD's Code Status: Full Family Communication:  None at bedside  Status is: Inpatient  Dispo: The patient is from: Home  Anticipated d/c is to: Home  Patient currently is not medically stable for discharge.              Difficult to place patient: No.   Body mass index is 29.7 kg/m.   Consultants:   General Surgery  Orthopedic Surgery  Infectious Diseases  Interventional Radiology  Urology  Procedures:  -2/24left hip total arthroplasty with cement interposition spacer  -3/1left thigh irrigation and debridement with wound vac placement,post op day 1 -3/3left groin I/D -3/4 image guided aspiration/drainage of remaining left thigh collection.   Antimicrobials:   Ancef IV  Metronidazole po  Subjective:  Patient alert and awake s.p left hip/thigh aspiration/drainage yesterday. Reports significant pain today which started when he was placed on a bedpan to have a bowel movement in the early am hours.  Pain in his left hip/leg still persists.  Also, he is reporting that his scrotal swelling today is becoming bothersome.  Examination:  General exam:Calm and cooperative, NAD, but has pain this morning due to movement in bed. Respiratory system: Clear to auscultation. Respiratory effort normal. Cardiovascular system:S1 &S2 heard, RRR. No JVD, murmurs, rubs, gallops or clicks. No pedal edema. Gastrointestinal system:Abdomen is nondistended, soft and nontender. No  organomegaly or masses felt. Normal bowel sounds heard. Abdomen: Has lower extremity drains from his left hip with 3 drains and one wound vac. Dressing sites for 2 lower drains clean and dry, groin dressing gauze with sanguinous drainage.  Wound vac was turned off  on assessment, nurse notified, no drainageovernight. Extremities:Left lower leg has swelling and blistering of medial aspect of left thigh into left groin and scrotum. GU: Scrotal edema without erythema, left groin blistering with adjacent area of left thigh also affected.Perineal area and right buttock also has erythema. Central nervous system:Alert and oriented. No focal neurological deficits. Extremities: Symmetric 5 x 5 power. Skin: left groin blistering with adjacent area of left thigh also affected.Perineal area and right buttock also has erythema. Psychiatry:Judgement and insight appear normal. Mood &affect appropriate.   Objective: Vitals:   09/24/20 0400 09/24/20 0700 09/24/20 0800 09/24/20 0900  BP:  134/76 122/72 (!) 112/57  Pulse:  (!) 123 (!) 123 (!) 106  Resp:  (!) 22 20 18   Temp: 98.2 F (36.8 C)  99.6 F (37.6 C)   TempSrc: Oral  Oral   SpO2:  98% 95% 95%  Weight:      Height:        Intake/Output Summary (Last 24 hours) at 09/24/2020 1043 Last data filed at 09/24/2020 0731 Gross per 24 hour  Intake 344.87 ml  Output 1545 ml  Net -1200.13 ml   Filed Weights   09/19/20 0500 09/19/20 0843 09/21/20 0500  Weight: 91.8 kg 97.8 kg 93.9 kg     Data Reviewed:   CBC: Recent Labs  Lab 09/18/20 0303 09/19/20 0305 09/20/20 0244 09/21/20 0234 09/21/20 1117 09/23/20 0939 09/24/20 0912  WBC 15.1* 14.5* 10.7* 11.9*  --  14.7* 18.5*  NEUTROABS 12.2* 11.8* 8.3* 9.8*  --   --   --   HGB 9.8* 8.8* 7.7* 6.4* 7.8* 7.4* 7.2*  HCT 30.2* 26.6* 24.0* 20.3* 23.5* 22.7* 21.9*  MCV 97.7 98.5 100.0 102.0*  --  100.4* 101.9*  PLT 86* 85* 99* 104*  --  141* 013*   Basic Metabolic Panel: Recent Labs  Lab  09/18/20 0303 09/19/20 0305 09/20/20 0244 09/21/20 0234 09/23/20 0243 09/24/20 0912  NA 129* 129* 131* 126* 127* 125*  K 4.4 4.4 4.3 4.8 5.0 4.7  CL 98 96* 95* 95* 96* 94*  CO2 23 24 23 23 23 24   GLUCOSE 91 100* 96 173* 142* 112*  BUN 10 14 13 10 11 12   CREATININE 0.44* 0.41* 0.43* 0.34* 0.44* 0.41*  CALCIUM 7.5* 7.4* 7.4* 7.1* 7.5* 7.4*  MG 1.9 1.9 1.9 1.7  --   --   PHOS 1.6* 3.4 2.7 3.1  --   --    GFR: Estimated Creatinine Clearance: 147 mL/min (A) (by C-G formula based on SCr of 0.41 mg/dL (L)). Liver Function Tests: Recent Labs  Lab 09/19/20 0305 09/20/20 0244 09/21/20 0234 09/23/20 0243 09/24/20 0912  AST 39 30 30 31  32  ALT 16 13 11 9 9   ALKPHOS 244* 240* 214* 211* 203*  BILITOT 1.7* 1.6* 1.5* 1.0 0.9  PROT 5.2* 5.2* 4.8* 5.2* 5.0*  ALBUMIN 2.0* 1.9* 1.7* 1.9* 1.9*   No results for input(s): LIPASE, AMYLASE in the last 168 hours. No results for input(s): AMMONIA in the last 168 hours. Coagulation Profile: Recent Labs  Lab 09/19/20 0305  INR 1.5*   Cardiac Enzymes: No results for input(s): CKTOTAL, CKMB, CKMBINDEX, TROPONINI in the last 168 hours. BNP (last 3 results) No results for input(s): PROBNP in the last 8760 hours. HbA1C: No results for input(s): HGBA1C in the last 72 hours. CBG: No results for input(s): GLUCAP in the last 168 hours. Lipid Profile: No results for input(s): CHOL, HDL, LDLCALC, TRIG, CHOLHDL, LDLDIRECT in the last 72 hours.  Thyroid Function Tests: No results for input(s): TSH, T4TOTAL, FREET4, T3FREE, THYROIDAB in the last 72 hours. Anemia Panel: No results for input(s): VITAMINB12, FOLATE, FERRITIN, TIBC, IRON, RETICCTPCT in the last 72 hours. Sepsis Labs: Recent Labs  Lab 09/18/20 0843 09/18/20 1105 09/19/20 0305 09/20/20 0244  PROCALCITON 7.78  --  4.14 2.02  LATICACIDVEN 2.8* 3.1*  --   --     Recent Results (from the past 240 hour(s))  Aerobic/Anaerobic Culture (surgical/deep wound)     Status: None   Collection  Time: 09/14/20 12:37 PM   Specimen: Abscess  Result Value Ref Range Status   Specimen Description   Final    ABSCESS  LEFT RP Performed at [facility] [address], [place], [place] [address]    Special Requests   Final    Normal Performed at [facility], [place] [address] [place], [place] [address]    Gram Stain   Final    MODERATE WBC PRESENT, PREDOMINANTLY PMN ABUNDANT GRAM POSITIVE COCCI ABUNDANT GRAM NEGATIVE RODS    Culture   Final    ABUNDANT STAPHYLOCOCCUS AUREUS MODERATE BACTEROIDES ORALIS FEW PREVOTELLA BIVIA BETA LACTAMASE POSITIVE Performed at [facility], [place] [address] [place], [place] [address]    Report Status 09/19/2020 FINAL  Final   Organism ID, Bacteria STAPHYLOCOCCUS AUREUS  Final      Susceptibility   Staphylococcus aureus - MIC*    CIPROFLOXACIN <=0.5 SENSITIVE Sensitive     ERYTHROMYCIN <=0.25 SENSITIVE Sensitive     GENTAMICIN <=0.5 SENSITIVE Sensitive     OXACILLIN <=0.25 SENSITIVE Sensitive     TETRACYCLINE <=1 SENSITIVE Sensitive     VANCOMYCIN <=0.5 SENSITIVE Sensitive     TRIMETH/SULFA <=10 SENSITIVE Sensitive     CLINDAMYCIN <=0.25 SENSITIVE Sensitive     RIFAMPIN <=0.5 SENSITIVE Sensitive     Inducible Clindamycin NEGATIVE Sensitive     * ABUNDANT STAPHYLOCOCCUS AUREUS  MRSA PCR Screening     Status: None   Collection Time: 09/15/20  3:22 AM   Specimen: Nasal Mucosa; Nasopharyngeal  Result Value Ref Range Status   MRSA by PCR NEGATIVE NEGATIVE Final    Comment:        The GeneXpert MRSA Assay (FDA approved for NASAL specimens only), is one component of a comprehensive MRSA colonization surveillance program. It is not intended to diagnose MRSA infection nor to guide or monitor treatment for MRSA infections. Performed at [facility], [place] [address], [place], [place] [address]          Radiology Studies: CT IMAGE GUIDED DRAINAGE BY PERCUTANEOUS  CATHETER  Result Date: 09/23/2020 INDICATION: Septic arthritis of the left hip status post multiple IR drain placement. Persistent fluid collection present in the left mid thigh. Interventional radiology consulted for drain placement. EXAM: CT GUIDED DRAINAGE OF LEFT MID THIGH ABSCESS MEDICATIONS: The patient is currently admitted to the hospital and receiving intravenous antibiotics. The antibiotics were administered within an appropriate time frame prior to the initiation of the procedure. ANESTHESIA/SEDATION: 4 mg IV Versed 100 mcg IV Fentanyl Moderate Sedation Time:  23 The patient was continuously monitored during the procedure by the interventional radiology nurse under my direct supervision. COMPLICATIONS: None immediate. TECHNIQUE: Informed written consent was obtained from the patient after a thorough discussion of the procedural risks, benefits and alternatives. All questions were addressed. Maximal Sterile Barrier Technique was utilized including caps, mask, sterile gowns, sterile gloves, sterile drape, hand hygiene and skin antiseptic. A  timeout was performed prior to the initiation of the procedure. PROCEDURE: The left mid thigh was prepped with Chlorhexidine in a sterile fashion, and a sterile drape was applied covering the operative field. A sterile gown and sterile gloves were used for the procedure. Local anesthesia was provided with 1% Lidocaine. Patient positioned supine on the procedure table. Following local lidocaine the striation, 18 gauge trocar needle advanced into the left mid thigh fluid collection utilizing CT guidance. The 18 gauge needle was exchanged for 10.2 [place] multipurpose pigtail drain over 0.035 inch guidewire. Minimal serosanguineous output from the drain. Drain was secured to skin with suture and connected to bulb suction. The patient was transferred to the ultrasound department. Ultrasound examination confirmed appropriate positioning of the drain within the left anterior  mid thigh collection. The collection appeared quite heterogeneous on ultrasound examination which explains the minimal output from the drain. FINDINGS: Contrast-enhanced CT of the left mid thigh demonstrated rim enhancing collection in the anterior compartment. IMPRESSION: 10.2 [place] multipurpose pigtail drain placed in left mid thigh collection. Position of drain confirmed both with CT and ultrasound. On ultrasound evaluation the collection appears quite complex which explains the minimal drain output. Electronically Signed   By: Miachel Roux M.D.   On: 09/23/2020 16:34        Scheduled Meds: . (feeding supplement) PROSource Plus  30 mL Oral BID BM  . sodium chloride   Intravenous Once  . carvedilol  6.25 mg Oral BID WC  . Chlorhexidine Gluconate Cloth  6 each Topical Daily  . docusate sodium  100 mg Oral BID  . feeding supplement  1 Container Oral BID BM  . folic acid  1 mg Oral Daily  . lactulose  10 g Oral BID  . mouth rinse  15 mL Mouth Rinse BID  . metroNIDAZOLE  500 mg Oral Q8H  . multivitamin with minerals  1 tablet Oral Daily  . nutrition supplement (JUVEN)  1 packet Oral BID BM  . pantoprazole  40 mg Oral BID  . sertraline  25 mg Oral Daily  . sodium chloride flush  5 mL Intracatheter Q8H  . sodium chloride flush  5 mL Intracatheter Q8H  . thiamine  100 mg Oral Daily   Continuous Infusions: .  ceFAZolin (ANCEF) IV Stopped (09/24/20 0718)     LOS: 11 days   Time spent= 35 mins   Dede Query, RN NP-Student   If 7PM-7AM, please contact night-coverage  09/24/2020, 10:43 AM

## 2020-09-24 NOTE — Progress Notes (Signed)
[facility]  PROGRESS NOTE  George GoldsMatthew V Barker ZOX:[id number] DOB: [birth year] DOA: 09/13/2020 PCP: Maud DeedHowley, Desiree, PA   Brief HPI:   35 year old male with past medical history of AVN of the hip, hypertension, GERD he was recently admitted on 08/22/2020 for COVID-19 and was discharged on 08/29/2020.  2 days after discharge patient began to feel extremely weak and tired with symptoms progressing over next 2 weeks.  Patient developed left thigh rash and leg pain with swelling and tenderness to touch.  Patient was seen at PCP office and was told to go to the ED immediately.  He was found to have left iliopsoas and left gluteal abscess with diffuse swelling and left hip effusion on CT scan.  General surgery was consulted.  Blood cultures positive for MSSA bacteremia.  He underwent drainage of the abscess with 3 drain placement by IR in his iliopsoas muscle and multiple abscesses in the legs.  On 09/15/2020 had left hip interposition arthroplasty with antibiotic spacer placement and debridement and developed decompensated septic shock with refractory hypotension despite 2 units PRBC, 2.2 L of crystalloid and 500 cc of albumin.  He was admitted to ICU with vasopressor support.  ID has been consulted and recommend 6 weeks of IV cefazolin. Patient also had scrotal edema anasarca in the setting of IV fluid resuscitation.  Ultrasound was unremarkable for abscess but showed marked asymmetric left scrotal wall thickening and fluid with increased blood flow consistent with soft tissue infection.      Subjective   Patient seen and examined, complains of pain in left thigh this morning after he had a bowel movement on bedpan.  Pain now well controlled after he received IV Dilaudid.   Assessment/Plan:     1. Left hip septic arthritis/left thigh fluid collection-patient has history of AVN, developed left thigh abscess.  He underwent drainage of the abscess with drain placement x3, left retroperitoneum, medial left  thigh, lateral left thigh and IR on 09/14/2020.  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  Patient underwent left thigh irrigation and debridement with wound VAC placement on 09/20/2020. General surgery took patient to the OR for left thigh irrigation and debridement with change of wound VAC.  Repeat CT showed persistent left anterior thigh fluid collection concerning for abscess, IR has been reconsulted for image guided aspiration/drainage of the remaining left thigh collection.  Both IR and general surgery are following.  WBC is up to 18.5 today.  Continue cefazolin, Flagyl.  Follow CBC in a.m. 2. Chronic pain-secondary to above, will continue oxycodone and Dilaudid 1 mg IV every 4 hours as needed. 3. MSSA bacteremia-patient developed MSSA bacteremia, likely from complicated iliopsoas/gluteal/thigh abscess.  Infectious disease was consulted.  Patient started on IV cefazolin.  Patient initially underwent TTE which did not show any vegetation.  As it was difficult study so he underwent transesophageal echocardiogram on 09/19/2020, which showed no vegetation.  ID is following, will need prolonged course of antibiotics.  Duration of antibiotics per ID. 4. Symptomatic anemia-patient presented with hemoglobin/hematocrit of 6.2/18.9, he received 4 units PRBC.  Anemia panel showed iron level 44, U IBC 131, TIBC 175, saturation is 25%.  LDH was 308, haptoglobin pending, his hemoglobin was 6.4 yesterday, received another unit of PRBC, hemoglobin is stable at 7.2 5. History of alcoholic hepatitis/liver cirrhosis-patient has history of alcohol abuse,  he was started on prednisolone for alcoholic hepatitis and was being tapered off.  Prednisone was discontinued due to bacteremia.  LFTs are back to  normal. 6. Hyponatremia-likely chronic from underlying liver cirrhosis, likely chronic.  We will continue to monitor sodium. 7. Scrotal edema-patient developed scrotal edema, scrotal ultrasound showed no evidence of testicular mass or torsion, mild asymmetric Scrotal wall thickening and fluid with increased blood flow.  Likely from soft tissue infection.  Will order scrotal support. 8. Thrombocytopenia-platelet count improved to 104,000.     COVID-19 Labs  No results for input(s): DDIMER, FERRITIN, LDH, CRP in the last 72 hours.  Lab Results  Component Value Date   SARSCOV2NAA NEGATIVE 09/13/2020   SARSCOV2NAA POSITIVE (A) 08/22/2020   SARSCOV2NAA DETECTED (A) 02/24/2019     Scheduled medications:   . (feeding supplement) PROSource Plus  30 mL Oral BID BM  . sodium chloride   Intravenous Once  . carvedilol  6.25 mg Oral BID WC  . Chlorhexidine Gluconate Cloth  6 each Topical Daily  . docusate sodium  100 mg Oral BID  . feeding supplement  1 Container Oral BID BM  . folic acid  1 mg Oral Daily  . lactulose  10 g Oral BID  . mouth rinse  15 mL Mouth Rinse BID  . metroNIDAZOLE  500 mg Oral Q8H  . multivitamin with minerals  1 tablet Oral Daily  . nutrition supplement (JUVEN)  1 packet Oral BID BM  . pantoprazole  40 mg Oral BID  . sertraline  25 mg Oral Daily  . sodium chloride flush  5 mL Intracatheter Q8H  . sodium chloride flush  5 mL Intracatheter Q8H  . thiamine  100 mg Oral Daily      SpO2: 95 % O2 Flow Rate (L/min): 2 L/min    CBC: Recent Labs  Lab 09/18/20 0303 09/19/20 0305 09/20/20 0244 09/21/20 0234 09/21/20 1117 09/23/20 0939 09/24/20 0912  WBC 15.1* 14.5* 10.7* 11.9*  --  14.7* 18.5*  NEUTROABS 12.2* 11.8* 8.3* 9.8*  --   --   --   HGB 9.8* 8.8* 7.7* 6.4* 7.8* 7.4* 7.2*  HCT 30.2* 26.6* 24.0* 20.3* 23.5* 22.7* 21.9*  MCV 97.7 98.5 100.0 102.0*  --  100.4* 101.9*  PLT 86* 85* 99* 104*  --  141* 145*    Basic Metabolic Panel: Recent Labs  Lab  09/18/20 0303 09/19/20 0305 09/20/20 0244 09/21/20 0234 09/23/20 0243  NA 129* 129* 131* 126* 127*  K 4.4 4.4 4.3 4.8 5.0  CL 98 96* 95* 95* 96*  CO2 23 24 23 23 23   GLUCOSE 91 100* 96 173* 142*  BUN 10 14 13 10 11   CREATININE 0.44* 0.41* 0.43* 0.34* 0.44*  CALCIUM 7.5* 7.4* 7.4* 7.1* 7.5*  MG 1.9 1.9 1.9 1.7  --   PHOS 1.6* 3.4 2.7 3.1  --      Liver Function Tests: Recent Labs  Lab 09/18/20 0303 09/19/20 0305 09/20/20 0244 09/21/20 0234 09/23/20 0243  AST 61* 39 30 30 31   ALT 23 16 13 11 9   ALKPHOS 270* 244* 240* 214* 211*  BILITOT 1.7* 1.7* 1.6* 1.5* 1.0  PROT 5.7* 5.2* 5.2* 4.8* 5.2*  ALBUMIN 2.3* 2.0* 1.9* 1.7* 1.9*     Antibiotics: Anti-infectives (From admission, onward)   Start     Dose/Rate Route Frequency Ordered Stop   09/18/20 1400  metroNIDAZOLE (FLAGYL) tablet 500 mg        500 mg Oral Every 8 hours  09/18/20 0927     09/15/20 1715  tobramycin (NEBCIN) powder  Status:  Discontinued          As needed 09/15/20 1715 09/15/20 2043   09/15/20 1715  vancomycin (VANCOCIN) powder  Status:  Discontinued          As needed 09/15/20 1716 09/15/20 2043   09/14/20 0600  ceFAZolin (ANCEF) IVPB 2g/100 mL premix        2 g 200 mL/hr over 30 Minutes Intravenous Every 8 hours 09/14/20 0359     09/13/20 2200  vancomycin (VANCOREADY) IVPB 1500 mg/300 mL  Status:  Discontinued        1,500 mg 150 mL/hr over 120 Minutes Intravenous Every 12 hours 09/13/20 1636 09/14/20 0359   09/13/20 1500  ceFEPIme (MAXIPIME) 2 g in sodium chloride 0.9 % 100 mL IVPB  Status:  Discontinued        2 g 200 mL/hr over 30 Minutes Intravenous Every 8 hours 09/13/20 1437 09/14/20 0359   09/13/20 1130  vancomycin (VANCOREADY) IVPB 1750 mg/350 mL        1,750 mg 175 mL/hr over 120 Minutes Intravenous  Once 09/13/20 1107 09/13/20 1414   09/13/20 1045  ceFAZolin (ANCEF) IVPB 1 g/50 mL premix        1 g 100 mL/hr over 30 Minutes Intravenous  Once 09/13/20 1031 09/13/20 1307       DVT  prophylaxis: SCDs  Code Status: Full code  Family Communication: Discussed with patient wife at bedside   Consultants:  General surgery  Orthopedics  Infectious disease  Interventional radiology  Procedures:  TOTAL HIP ARTHROPLASTY WITH CEMENT INTERPOSITION SPACING for septic hip arthritis from septicemia in the setting of alcoholic AVN of the left hip  Drainage of the abscess with 3 drain placement by IR in iliopsoas muscle and multiple abscesses in the legs     Objective   Vitals:   09/24/20 0400 09/24/20 0700 09/24/20 0800 09/24/20 0900  BP:  134/76 122/72 (!) 112/57  Pulse:  (!) 123 (!) 123 (!) 106  Resp:  (!) 22 20 18   Temp: 98.2 F (36.8 C)  99.6 F (37.6 C)   TempSrc: Oral  Oral   SpO2:  98% 95% 95%  Weight:      Height:        Intake/Output Summary (Last 24 hours) at 09/24/2020 1000 Last data filed at 09/24/2020 0731 Gross per 24 hour  Intake 344.87 ml  Output 1545 ml  Net -1200.13 ml    03/03 1901 - 03/05 0700 In: 819.9 [P.O.:475] Out: 1575 [Urine:925; Drains:650]  Filed Weights   09/19/20 0500 09/19/20 0843 09/21/20 0500  Weight: 91.8 kg 97.8 kg 93.9 kg    Physical Examination:    General-appears in no acute distress  Heart-S1-S2, regular, no murmur auscultated  Lungs-clear to auscultation bilaterally, no wheezing or crackles auscultated  Abdomen-left upper thigh has mild edema, wound VAC in place.  GU-scrotal edema noted, no erythema.   Neuro-alert, oriented x3, no focal deficit noted   Status is: Inpatient  Dispo: The patient is from: Home              Anticipated d/c is to: Skilled nursing facility              Anticipated d/c date is: 09/29/2020              Patient currently not stable for discharge  Barrier to discharge-ongoing management for iliopsoas abscess  Data Reviewed:   Recent Results (from the past 240 hour(s))  Aerobic/Anaerobic Culture (surgical/deep wound)     Status: None   Collection Time:  09/14/20 12:37 PM   Specimen: Abscess  Result Value Ref Range Status   Specimen Description   Final    ABSCESS  LEFT RP Performed at [facility], [address] [place], [place] [address]    Special Requests   Final    Normal Performed at [facility], [address], [place], [place] [address]    Gram Stain   Final    MODERATE WBC PRESENT, PREDOMINANTLY PMN ABUNDANT GRAM POSITIVE COCCI ABUNDANT GRAM NEGATIVE RODS    Culture   Final    ABUNDANT STAPHYLOCOCCUS AUREUS MODERATE BACTEROIDES ORALIS FEW PREVOTELLA BIVIA BETA LACTAMASE POSITIVE Performed at [facility], [address], [place], [place] [address]    Report Status 09/19/2020 FINAL  Final   Organism ID, Bacteria STAPHYLOCOCCUS AUREUS  Final      Susceptibility   Staphylococcus aureus - MIC*    CIPROFLOXACIN <=0.5 SENSITIVE Sensitive     ERYTHROMYCIN <=0.25 SENSITIVE Sensitive     GENTAMICIN <=0.5 SENSITIVE Sensitive     OXACILLIN <=0.25 SENSITIVE Sensitive     TETRACYCLINE <=1 SENSITIVE Sensitive     VANCOMYCIN <=0.5 SENSITIVE Sensitive     TRIMETH/SULFA <=10 SENSITIVE Sensitive     CLINDAMYCIN <=0.25 SENSITIVE Sensitive     RIFAMPIN <=0.5 SENSITIVE Sensitive     Inducible Clindamycin NEGATIVE Sensitive     * ABUNDANT STAPHYLOCOCCUS AUREUS  MRSA PCR Screening     Status: None   Collection Time: 09/15/20  3:22 AM   Specimen: Nasal Mucosa; Nasopharyngeal  Result Value Ref Range Status   MRSA by PCR NEGATIVE NEGATIVE Final    Comment:        The GeneXpert MRSA Assay (FDA approved for NASAL specimens only), is one component of a comprehensive MRSA colonization surveillance program. It is not intended to diagnose MRSA infection nor to guide or monitor treatment for MRSA infections. Performed at [facility], [address] [place], [place] [address]     No results for input(s): LIPASE, AMYLASE in the last 168 hours. No results for input(s):  AMMONIA in the last 168 hours.  Cardiac Enzymes: No results for input(s): CKTOTAL, CKMB, CKMBINDEX, TROPONINI in the last 168 hours. BNP (last 3 results) No results for input(s): BNP in the last 8760 hours.  ProBNP (last 3 results) No results for input(s): PROBNP in the last 8760 hours.  Studies:  CT IMAGE GUIDED DRAINAGE BY PERCUTANEOUS CATHETER  Result Date: 09/23/2020 INDICATION: Septic arthritis of the left hip status post multiple IR drain placement. Persistent fluid collection present in the left mid thigh. Interventional radiology consulted for drain placement. EXAM: CT GUIDED DRAINAGE OF LEFT MID THIGH ABSCESS MEDICATIONS: The patient is currently admitted to the hospital and receiving intravenous antibiotics. The antibiotics were administered within an appropriate time frame prior to the initiation of the procedure. ANESTHESIA/SEDATION: 4 mg IV Versed 100 mcg IV Fentanyl Moderate Sedation Time:  23 The patient was continuously monitored during the procedure by the interventional radiology nurse under my direct supervision. COMPLICATIONS: None immediate. TECHNIQUE: Informed written consent was obtained from the patient after a thorough discussion of the procedural risks, benefits and alternatives. All questions were addressed. Maximal Sterile Barrier Technique was utilized including caps, mask, sterile gowns, sterile gloves, sterile drape, hand hygiene and skin antiseptic. A timeout was performed  prior to the initiation of the procedure. PROCEDURE: The left mid thigh was prepped with Chlorhexidine in a sterile fashion, and a sterile drape was applied covering the operative field. A sterile gown and sterile gloves were used for the procedure. Local anesthesia was provided with 1% Lidocaine. Patient positioned supine on the procedure table. Following local lidocaine the striation, 18 gauge trocar needle advanced into the left mid thigh fluid collection utilizing CT guidance. The 18 gauge needle  was exchanged for 10.2 [place] multipurpose pigtail drain over 0.035 inch guidewire. Minimal serosanguineous output from the drain. Drain was secured to skin with suture and connected to bulb suction. The patient was transferred to the ultrasound department. Ultrasound examination confirmed appropriate positioning of the drain within the left anterior mid thigh collection. The collection appeared quite heterogeneous on ultrasound examination which explains the minimal output from the drain. FINDINGS: Contrast-enhanced CT of the left mid thigh demonstrated rim enhancing collection in the anterior compartment. IMPRESSION: 10.2 [place] multipurpose pigtail drain placed in left mid thigh collection. Position of drain confirmed both with CT and ultrasound. On ultrasound evaluation the collection appears quite complex which explains the minimal drain output. Electronically Signed   By: Acquanetta Belling M.D.   On: 09/23/2020 16:34       [name]   [facility] If 7PM-7AM, please contact night-coverage at [email or web address], Office  [phone]   09/24/2020, 10:00 AM  LOS: 11 days

## 2020-09-25 DIAGNOSIS — K7030 Alcoholic cirrhosis of liver without ascites: Secondary | ICD-10-CM | POA: Diagnosis not present

## 2020-09-25 DIAGNOSIS — L02416 Cutaneous abscess of left lower limb: Secondary | ICD-10-CM | POA: Diagnosis not present

## 2020-09-25 DIAGNOSIS — F1029 Alcohol dependence with unspecified alcohol-induced disorder: Secondary | ICD-10-CM | POA: Diagnosis not present

## 2020-09-25 DIAGNOSIS — K7010 Alcoholic hepatitis without ascites: Secondary | ICD-10-CM | POA: Diagnosis not present

## 2020-09-25 LAB — CBC
HCT: 23.3 % — ABNORMAL LOW (ref 39.0–52.0)
Hemoglobin: 7.5 g/dL — ABNORMAL LOW (ref 13.0–17.0)
MCH: 32.9 pg (ref 26.0–34.0)
MCHC: 32.2 g/dL (ref 30.0–36.0)
MCV: 102.2 fL — ABNORMAL HIGH (ref 80.0–100.0)
Platelets: 155 10*3/uL (ref 150–400)
RBC: 2.28 MIL/uL — ABNORMAL LOW (ref 4.22–5.81)
RDW: 17.7 % — ABNORMAL HIGH (ref 11.5–15.5)
WBC: 15.1 10*3/uL — ABNORMAL HIGH (ref 4.0–10.5)
nRBC: 0.1 % (ref 0.0–0.2)

## 2020-09-25 LAB — COMPREHENSIVE METABOLIC PANEL
ALT: 8 U/L (ref 0–44)
AST: 31 U/L (ref 15–41)
Albumin: 1.8 g/dL — ABNORMAL LOW (ref 3.5–5.0)
Alkaline Phosphatase: 198 U/L — ABNORMAL HIGH (ref 38–126)
Anion gap: 6 (ref 5–15)
BUN: 11 mg/dL (ref 6–20)
CO2: 25 mmol/L (ref 22–32)
Calcium: 7.6 mg/dL — ABNORMAL LOW (ref 8.9–10.3)
Chloride: 96 mmol/L — ABNORMAL LOW (ref 98–111)
Creatinine, Ser: 0.44 mg/dL — ABNORMAL LOW (ref 0.61–1.24)
GFR, Estimated: >60 mL/min (ref >60)
Glucose, Bld: 92 mg/dL (ref 70–99)
Potassium: 5.3 mmol/L — ABNORMAL HIGH (ref 3.5–5.1)
Sodium: 127 mmol/L — ABNORMAL LOW (ref 135–145)
Total Bilirubin: 1.1 mg/dL (ref 0.3–1.2)
Total Protein: 5.2 g/dL — ABNORMAL LOW (ref 6.5–8.1)

## 2020-09-25 MED ORDER — LIP MEDEX EX OINT
TOPICAL_OINTMENT | CUTANEOUS | Status: AC
  Filled 2020-09-25: qty 7

## 2020-09-25 MED ORDER — SODIUM CHLORIDE 0.9 % IV SOLN: INTRAVENOUS | Status: DC

## 2020-09-25 NOTE — Progress Notes (Signed)
PROGRESS NOTE    [name]  WUJ:[id number] DOB: 23-Dec-1985 DOA: 09/13/2020 PCP: [name], PA   Brief Narrative:   [name] is a 35 yo male with past medical historysignificant for AVN of the hip, HTN, and GERD. [name] was recently admitted on 08/22/20 for COVID-19 and was discharge on 08/29/20. About two days post discharge, [name] began to feel extremely weak and tired with symptoms progressively worsening over the next two weeks. During this time. He also developed left leg thigh and legs pain along with swelling and tenderness to touch. He also developed fluid filled blisters and skin sloughing on the medial portion of his left thigh extending into his left groin. Symptoms escalated in intensity until his was unable to walk without significant pain. [name] saw his PCP on 09/12/20 for a lab draw and was called by his PCP office to go to the ED immediately.  ED Course: Hgb 6.2 with 2U PRBC's given, FOBT negative. CT abdomen showedleft iliopsoas and left gluteal abscesses w/ diffuse swelling and left hip effusion. CCS was consulted. [name] was started on cefazolin and vanc.  Interim History: Blood cultures positive for MSSA bacteremia with ID consulted, repeat blood cultures, and transesophageal echo obtained. He underwent 3 drain placement by IRfor abscessesinhis iliopsoas muscle and multiple abscesses in the legs.  On 09/15/20, he had a lefthip Interpositionarthoplastywithantibiotic spacer placement and debridementand developeddecompensated septic shockwith refractoryhypotension despite 2 unit PRBCs, 2.2 L of crystalloid and 500 mL of albumin.He was admitted to the ICU with vasopressor support. He is now hemodynamically stableon TRH service.  [name] had left groin I/Don 09/22/20 and an additional aspiration and drainage on 09/23/20.   Assessment & Plan:   Principal Problem:   MSSA bacteremia Active Problems:   Alcohol dependence ([name])   Alcoholic  hepatitis   Sepsis ([name])   Septic arthritis ([name])   Iliopsoas abscess on left [name])  #MSSA bacteremia/Left hip septic arthritis with iliopsoas, left gluteal, and left medial thigh abscess/Left hip effusion and septic arthritis in the setting of avascular necrosis -Initially started onIV vanc,cefepime,but changed to IV Cefazolin by ID -Initial Blood Cx 09/13/20 showed Staph Aureus inboth sets of Cx's, transthoracic echo negative for vegetation, normal valvular and systolic function -Sepsis Criteria met with: tachycardia, tachypnea, lactic acid 3.3, and Leukocytosis was 10.6 source: abscesses as above as well as bacteremia and went into septic shock this admission. -Repeat blood cultures negative, continue course of cefazolin per ID. -2/28/22TEEshowednormalvalves and no evidence of vegetation. - Wound cultures positive for S. Aureus, bacteroides, Prevotella - WBC's 15.1->14.5->10.7 (09/20/20)->11.9->14.7->18.5->15.1, initially trending down, increased with recent aspiration/drain procedures, now decreasing, continue to trend. - Continues to have pain but less severe, on day31mtrinidazole, day 10cefazolin - ID considering possible PICC placement given prolonged 6 week course of IV cefazolin -2/22 CT Abdomen shows interval development of innumerable fluid collections are seen within the musculature of the left ileopsoas muscle as well as the musculature of the visualized portion of the left gluteal region and proximal left thigh consistent with innumerable abscesses -2/24left hip total arthroplasty with cement interposition spacer  -3/1left thigh irrigation and debridement with wound vac placement-post op day4 -3/3left groin I/D  -3/4 image guided aspiration/drainage of remaining left thigh collection. -Wound care orders, per surgery.  Anasarca in setting of IVF resuscitation, MSSA bacteremia, sepsis, and hepatic cirrhosis -Scrotal edema, and left groin and leg swelling  persists -Scrotal U/SMarked asymmetric left scrotal wall thickening and fluid, with increased blood flow.Which is consistent with soft tissue infection. -  IVF stopped with IV antibiotics as above  Scrotal edema, likely secondary to soft tissue infection and IVF resuscitation -U/S negative for abscess, IVF stopped -Scrotal sling ordered, symptoms improving  Symptommatic anemia/macrocytic anemia -On admission H/H 6.2/18.9 s/p6U PRBC's total this admission, currently7.5/23.3,asymptomatic,FOBC negative -Anemia panel: iron level of 44, UIBC 131, TIBC 175, saturation ratios of 25% -Approximately200 ml sanguinous drainage from wound vacO/N -Transfused 1Uon3/2/22for Hgb < 7, trend H/Hpost transfusion  Sepsis/Metabolic Acidosis/Electrolyte imbalances (Hyponatremia and Hypophosphatemia) -Sepsis and metabolic acidosis resolved, anion gap closed, d/c IVF due to anasarca -[name]'s phosphorus level is2.1, following repletion on 3/1 withIV Sodium Phos 20 mmol -Sodium is now125, replete withSodium Phos IV, and further diurese with Lasix  Hx ofAlcoholism/Alcoholic Hepatitis/HepaticCirrhosis,Hyperbilirubinemia/Mildly abnormal AST -2/22 CT Abdomen consistent with hepatic cirrhosis -Initially placed on prednisolone,72m through 09/26/20, followed by a taper. Per GI givendisseminated MSSA bacteremia taper discontinued -Per [name] drinks 12-18 beers weekly -Liver panel: total Bili 1.5improved from peak of 3.2, albumin 1.7, alk phos 214, AST 46->30,ammonia 45 -Continue lactulose 10 g p.o. twice daily, thiamine, magnesium -[name] and family report he has been alcohol free for 1 month, counsel oncontinuedcessation  Difficulty voiding rt fluid volume overload -[name] current has Foley and was diuresed with 40 mg IV Lasix over the past several days -Foley d/c, has voided  Tachycardia, hypertension, currently sinus tach rate average ~110 -Continue Carvedilol 3.125 mg p.o.  twice daily  Pain control -Oxycodoneprn, discontinued, now on Dilaudid 1 mg IV q4h  Hypomagnesia -Repleted, currently 1.7 -Continue to trend.  Protein Malnutrition, mild -Per dietitian, [name]'s girlfriend to bring in FSanfordshakes which he was consuming PTA  Thrombocytopenia, in setting of cirrhosis -No s/s of bleeding Plts 145,000 down from 170,000  GAD -Continue sertraline 25 mg p.o. nightly  GERD -C/w Pantoprazole 40 mg p.o. twice daily  DVT prophylaxis: SCD's Code Status: Full Family Communication:  None at bedside  Status is: Inpatient  Dispo: The [name] is from:Home Anticipated d/c is tPF:XTKWPatient currently is not medically stable for discharge. Difficult to place [name]: No.   Body mass index is 29.7 kg/m.   Consultants:   General Surgery  Orthopedic Surgery  Infectious Diseases  Interventional Radiology  Urology  Procedures:  -2/24left hip total arthroplasty with cement interposition spacer  -3/1left thigh irrigation and debridement with wound vac placement,post op day 1 -3/3left groin I/D -3/4 image guided aspiration/drainage of remaining left thigh collection.   Antimicrobials:   Ancef IV  Metronidazole po  Subjective:  [name] alert and awake s.p left hip/thigh aspiration/drainage yesterday. Reports significant pain today which started when he was placed on a bedpan to have a bowel movement in the early am hours.  Pain in his left hip/leg still persists.  Also, he is reporting that his scrotal swelling today is becoming bothersome.  Examination:  General exam:Calm and cooperative, NAD, but has pain this morning due to movement in bed. Respiratory system: Clear to auscultation. Respiratory effort normal. Cardiovascular system:S1 &S2 heard, RRR. No JVD, murmurs, rubs, gallops or clicks. No pedal edema. Gastrointestinal system:Abdomen is nondistended,  soft and nontender. No organomegaly or masses felt. Normal bowel sounds heard. Abdomen: Has lower extremity drains from his left hip with 3 drains and one wound vac. Dressing sites for 2 lower drains clean and dry, groin dressing gauze with sanguinous drainage.  Wound vac was turned off on assessment, nurse notified, no drainageovernight. Extremities:Left lower leg has swelling and blistering of medial aspect of left thigh into left groin and scrotum. GU: Scrotal  edema without erythema, left groin blistering with adjacent area of left thigh also affected.Perineal area and right buttock also has erythema. Central nervous system:Alert and oriented. No focal neurological deficits. Extremities: Symmetric 5 x 5 power. Skin: left groin blistering with adjacent area of left thigh also affected.Perineal area and right buttock also has erythema. Psychiatry:Judgement and insight appear normal. Mood &affect appropriate.   Objective: Vitals:   09/25/20 0800 09/25/20 0821 09/25/20 0900 09/25/20 1000  BP:  124/72 112/66 121/63  Pulse: (!) 116 (!) 123 (!) 117 (!) 115  Resp: 20 (!) 25 (!) 27 (!) 26  Temp: 98.1 F (36.7 C)     TempSrc: Oral     SpO2: 99% 97% 96% 100%  Weight:      Height:        Intake/Output Summary (Last 24 hours) at 09/25/2020 1028 Last data filed at 09/25/2020 0600 Gross per 24 hour  Intake 297.33 ml  Output 1400 ml  Net -1102.67 ml   Filed Weights   09/19/20 0500 09/19/20 0843 09/21/20 0500  Weight: 91.8 kg 97.8 kg 93.9 kg     Data Reviewed:   CBC: Recent Labs  Lab 09/19/20 0305 09/20/20 0244 09/21/20 0234 09/21/20 1117 09/23/20 0939 09/24/20 0912 09/25/20 0247  WBC 14.5* 10.7* 11.9*  --  14.7* 18.5* 15.1*  NEUTROABS 11.8* 8.3* 9.8*  --   --   --   --   HGB 8.8* 7.7* 6.4* 7.8* 7.4* 7.2* 7.5*  HCT 26.6* 24.0* 20.3* 23.5* 22.7* 21.9* 23.3*  MCV 98.5 100.0 102.0*  --  100.4* 101.9* 102.2*  PLT 85* 99* 104*  --  141* 145* 403   Basic Metabolic  Panel: Recent Labs  Lab 09/19/20 0305 09/20/20 0244 09/21/20 0234 09/23/20 0243 09/24/20 0912 09/25/20 0247  NA 129* 131* 126* 127* 125* 127*  K 4.4 4.3 4.8 5.0 4.7 5.3*  CL 96* 95* 95* 96* 94* 96*  CO2 24 23 23 23 24 25   GLUCOSE 100* 96 173* 142* 112* 92  BUN 14 13 10 11 12 11   CREATININE 0.41* 0.43* 0.34* 0.44* 0.41* 0.44*  CALCIUM 7.4* 7.4* 7.1* 7.5* 7.4* 7.6*  MG 1.9 1.9 1.7  --   --   --   PHOS 3.4 2.7 3.1  --   --   --    GFR: Estimated Creatinine Clearance: 147 mL/min (A) (by C-G formula based on SCr of 0.44 mg/dL (L)). Liver Function Tests: Recent Labs  Lab 09/20/20 0244 09/21/20 0234 09/23/20 0243 09/24/20 0912 09/25/20 0247  AST 30 30 31  32 31  ALT 13 11 9 9 8   ALKPHOS 240* 214* 211* 203* 198*  BILITOT 1.6* 1.5* 1.0 0.9 1.1  PROT 5.2* 4.8* 5.2* 5.0* 5.2*  ALBUMIN 1.9* 1.7* 1.9* 1.9* 1.8*   No results for input(s): LIPASE, AMYLASE in the last 168 hours. No results for input(s): AMMONIA in the last 168 hours. Coagulation Profile: Recent Labs  Lab 09/19/20 0305  INR 1.5*   Cardiac Enzymes: No results for input(s): CKTOTAL, CKMB, CKMBINDEX, TROPONINI in the last 168 hours. BNP (last 3 results) No results for input(s): PROBNP in the last 8760 hours. HbA1C: No results for input(s): HGBA1C in the last 72 hours. CBG: No results for input(s): GLUCAP in the last 168 hours. Lipid Profile: No results for input(s): CHOL, HDL, LDLCALC, TRIG, CHOLHDL, LDLDIRECT in the last 72 hours. Thyroid Function Tests: No results for input(s): TSH, T4TOTAL, FREET4, T3FREE, THYROIDAB in the last 72 hours. Anemia Panel: No results  for input(s): VITAMINB12, FOLATE, FERRITIN, TIBC, IRON, RETICCTPCT in the last 72 hours. Sepsis Labs: Recent Labs  Lab 09/18/20 1105 09/19/20 0305 09/20/20 0244  PROCALCITON  --  4.14 2.02  LATICACIDVEN 3.1*  --   --     No results found for this or any previous visit (from the past 240 hour(s)).       Radiology Studies: CT IMAGE  GUIDED DRAINAGE BY PERCUTANEOUS CATHETER  Result Date: 09/23/2020 INDICATION: Septic arthritis of the left hip status post multiple IR drain placement. Persistent fluid collection present in the left mid thigh. Interventional radiology consulted for drain placement. EXAM: CT GUIDED DRAINAGE OF LEFT MID THIGH ABSCESS MEDICATIONS: The [name] is currently admitted to the hospital and receiving intravenous antibiotics. The antibiotics were administered within an appropriate time frame prior to the initiation of the procedure. ANESTHESIA/SEDATION: 4 mg IV Versed 100 mcg IV Fentanyl Moderate Sedation Time:  23 The [name] was continuously monitored during the procedure by the interventional radiology nurse under my direct supervision. COMPLICATIONS: None immediate. TECHNIQUE: Informed written consent was obtained from the [name] after a thorough discussion of the procedural risks, benefits and alternatives. All questions were addressed. Maximal Sterile Barrier Technique was utilized including caps, mask, sterile gowns, sterile gloves, sterile drape, hand hygiene and skin antiseptic. A timeout was performed prior to the initiation of the procedure. PROCEDURE: The left mid thigh was prepped with Chlorhexidine in a sterile fashion, and a sterile drape was applied covering the operative field. A sterile gown and sterile gloves were used for the procedure. Local anesthesia was provided with 1% Lidocaine. [name] positioned supine on the procedure table. Following local lidocaine the striation, 18 gauge trocar needle advanced into the left mid thigh fluid collection utilizing CT guidance. The 18 gauge needle was exchanged for 10.2 [place] multipurpose pigtail drain over 0.035 inch guidewire. Minimal serosanguineous output from the drain. Drain was secured to skin with suture and connected to bulb suction. The [name] was transferred to the ultrasound department. Ultrasound examination confirmed appropriate positioning of the  drain within the left anterior mid thigh collection. The collection appeared quite heterogeneous on ultrasound examination which explains the minimal output from the drain. FINDINGS: Contrast-enhanced CT of the left mid thigh demonstrated rim enhancing collection in the anterior compartment. IMPRESSION: 10.2 [place] multipurpose pigtail drain placed in left mid thigh collection. Position of drain confirmed both with CT and ultrasound. On ultrasound evaluation the collection appears quite complex which explains the minimal drain output. Electronically Signed   By: Miachel Roux M.D.   On: 09/23/2020 16:34        Scheduled Meds: . (feeding supplement) PROSource Plus  30 mL Oral BID BM  . sodium chloride   Intravenous Once  . carvedilol  6.25 mg Oral BID WC  . Chlorhexidine Gluconate Cloth  6 each Topical Daily  . docusate sodium  100 mg Oral BID  . feeding supplement  1 Container Oral BID BM  . folic acid  1 mg Oral Daily  . lactulose  10 g Oral BID  . mouth rinse  15 mL Mouth Rinse BID  . metroNIDAZOLE  500 mg Oral Q8H  . multivitamin with minerals  1 tablet Oral Daily  . nutrition supplement (JUVEN)  1 packet Oral BID BM  . pantoprazole  40 mg Oral BID  . sertraline  25 mg Oral Daily  . sodium chloride flush  5 mL Intracatheter Q8H  . sodium chloride flush  5 mL Intracatheter Q8H  .  thiamine  100 mg Oral Daily   Continuous Infusions: .  ceFAZolin (ANCEF) IV Stopped (09/25/20 0530)     LOS: 12 days   Time spent= 35 mins   Dede Query, RN, NP Student   If 7PM-7AM, please contact night-coverage  09/25/2020, 10:28 AM

## 2020-09-25 NOTE — Progress Notes (Signed)
[facility]  PROGRESS NOTE  [name] HQI:[id number] DOB: Jun 02, 1986 DOA: 09/13/2020 PCP: [name], PA   Brief HPI:   35 year old male with past medical history of AVN of the hip, hypertension, GERD he was recently admitted on 08/22/2020 for COVID-19 and was discharged on 08/29/2020.  2 days after discharge patient began to feel extremely weak and tired with symptoms progressing over next 2 weeks.  Patient developed left thigh rash and leg pain with swelling and tenderness to touch.  Patient was seen at PCP office and was told to go to the ED immediately.  He was found to have left iliopsoas and left gluteal abscess with diffuse swelling and left hip effusion on CT scan.  General surgery was consulted.  Blood cultures positive for MSSA bacteremia.  He underwent drainage of the abscess with 3 drain placement by IR in his iliopsoas muscle and multiple abscesses in the legs.  On 09/15/2020 had left hip interposition arthroplasty with antibiotic spacer placement and debridement and developed decompensated septic shock with refractory hypotension despite 2 units PRBC, 2.2 L of crystalloid and 500 cc of albumin.  He was admitted to ICU with vasopressor support.  ID has been consulted and recommend 6 weeks of IV cefazolin. Patient also had scrotal edema anasarca in the setting of IV fluid resuscitation.  Ultrasound was unremarkable for abscess but showed marked asymmetric left scrotal wall thickening and fluid with increased blood flow consistent with soft tissue infection.      Subjective   Patient seen and examined, patient adequately controlled.   Assessment/Plan:     1. Left hip septic arthritis/left thigh fluid collection-patient has history of AVN, developed left thigh abscess.  He underwent drainage of the abscess with drain placement x3, left retroperitoneum, medial left thigh, lateral left thigh and IR on 09/14/2020.  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  Patient underwent left thigh irrigation and debridement with wound VAC placement on 09/20/2020. General surgery took patient to the OR for left thigh irrigation and debridement with change of wound VAC.  Repeat CT showed persistent left anterior thigh fluid collection concerning for abscess, IR has been reconsulted for image guided aspiration/drainage of the remaining left thigh collection.  Both IR and general surgery are following.  WBC is up to 18.5 today.  Continue cefazolin, Flagyl.  Follow CBC in a.m. 2. Chronic pain-secondary to above, will continue oxycodone and Dilaudid 1 mg IV every 4 hours as needed. 3. Septic shock/MSSA bacteremia-patient presented with septic shock, resolved.  Also developed MSSA bacteremia, likely from complicated iliopsoas/gluteal/thigh abscess.  Infectious disease was consulted.  Patient started on IV cefazolin.  Patient initially underwent TTE which did not show any vegetation.  As it was difficult study so he underwent transesophageal echocardiogram on 09/19/2020, which showed no vegetation.  ID is following, will need prolonged course of antibiotics.  Duration of antibiotics per ID. 4. Symptomatic anemia-patient presented with hemoglobin/hematocrit of 6.2/18.9, he received 4 units PRBC.  Anemia panel showed iron level 44, U IBC 131, TIBC 175, saturation is 25%.  LDH was 308, haptoglobin pending, his hemoglobin was 6.4 yesterday, received another unit of PRBC, hemoglobin is stable at 7.2 5. History of alcoholic hepatitis/liver cirrhosis-patient has history of alcohol abuse, he was started on prednisolone for alcoholic hepatitis and was being tapered off.  Prednisone  was discontinued due to bacteremia.  LFTs are back to normal. 6. Hyponatremia-likely chronic from underlying liver  cirrhosis, likely chronic.  We will continue to monitor sodium. 7. Scrotal edema-patient developed scrotal edema, scrotal ultrasound showed no evidence of testicular mass or torsion, mild asymmetric Scrotal wall thickening and fluid with increased blood flow.  Likely from soft tissue infection.  Will order scrotal support. 8. Thrombocytopenia-platelet count improved to 104,000.     COVID-19 Labs  No results for input(s): DDIMER, FERRITIN, LDH, CRP in the last 72 hours.  Lab Results  Component Value Date   SARSCOV2NAA NEGATIVE 09/13/2020   SARSCOV2NAA POSITIVE (A) 08/22/2020   SARSCOV2NAA DETECTED (A) 02/24/2019     Scheduled medications:   . (feeding supplement) PROSource Plus  30 mL Oral BID BM  . sodium chloride   Intravenous Once  . carvedilol  6.25 mg Oral BID WC  . Chlorhexidine Gluconate Cloth  6 each Topical Daily  . docusate sodium  100 mg Oral BID  . feeding supplement  1 Container Oral BID BM  . folic acid  1 mg Oral Daily  . lactulose  10 g Oral BID  . mouth rinse  15 mL Mouth Rinse BID  . metroNIDAZOLE  500 mg Oral Q8H  . multivitamin with minerals  1 tablet Oral Daily  . nutrition supplement (JUVEN)  1 packet Oral BID BM  . pantoprazole  40 mg Oral BID  . sertraline  25 mg Oral Daily  . sodium chloride flush  5 mL Intracatheter Q8H  . sodium chloride flush  5 mL Intracatheter Q8H  . thiamine  100 mg Oral Daily      SpO2: 100 % O2 Flow Rate (L/min): 2 L/min    CBC: Recent Labs  Lab 09/19/20 0305 09/20/20 0244 09/21/20 0234 09/21/20 1117 09/23/20 0939 09/24/20 0912 09/25/20 0247  WBC 14.5* 10.7* 11.9*  --  14.7* 18.5* 15.1*  NEUTROABS 11.8* 8.3* 9.8*  --   --   --   --   HGB 8.8* 7.7* 6.4* 7.8* 7.4* 7.2* 7.5*  HCT 26.6* 24.0* 20.3* 23.5* 22.7* 21.9* 23.3*  MCV 98.5 100.0 102.0*  --  100.4* 101.9* 102.2*  PLT 85* 99* 104*  --  141* 145* 155    Basic Metabolic Panel: Recent Labs  Lab 09/19/20 0305 09/20/20 0244 09/21/20 0234 09/23/20 0243  09/24/20 0912 09/25/20 0247  NA 129* 131* 126* 127* 125* 127*  K 4.4 4.3 4.8 5.0 4.7 5.3*  CL 96* 95* 95* 96* 94* 96*  CO2 24 23 23 23 24 25   GLUCOSE 100* 96 173* 142* 112* 92  BUN 14 13 10 11 12 11   CREATININE 0.41* 0.43* 0.34* 0.44* 0.41* 0.44*  CALCIUM 7.4* 7.4* 7.1* 7.5* 7.4* 7.6*  MG 1.9 1.9 1.7  --   --   --   PHOS 3.4 2.7 3.1  --   --   --      Liver Function Tests: Recent Labs  Lab 09/20/20 0244 09/21/20 0234 09/23/20 0243 09/24/20 0912 09/25/20 0247  AST 30 30 31  32 31  ALT 13 11 9 9 8   ALKPHOS 240* 214* 211* 203* 198*  BILITOT 1.6* 1.5* 1.0 0.9 1.1  PROT 5.2* 4.8* 5.2* 5.0* 5.2*  ALBUMIN 1.9* 1.7* 1.9* 1.9* 1.8*     Antibiotics: Anti-infectives (From admission, onward)   Start     Dose/Rate Route Frequency Ordered Stop   09/18/20 1400  metroNIDAZOLE (FLAGYL) tablet 500 mg  500 mg Oral Every 8 hours 09/18/20 0927     09/15/20 1715  tobramycin (NEBCIN) powder  Status:  Discontinued          As needed 09/15/20 1715 09/15/20 2043   09/15/20 1715  vancomycin (VANCOCIN) powder  Status:  Discontinued          As needed 09/15/20 1716 09/15/20 2043   09/14/20 0600  ceFAZolin (ANCEF) IVPB 2g/100 mL premix        2 g 200 mL/hr over 30 Minutes Intravenous Every 8 hours 09/14/20 0359     09/13/20 2200  vancomycin (VANCOREADY) IVPB 1500 mg/300 mL  Status:  Discontinued        1,500 mg 150 mL/hr over 120 Minutes Intravenous Every 12 hours 09/13/20 1636 09/14/20 0359   09/13/20 1500  ceFEPIme (MAXIPIME) 2 g in sodium chloride 0.9 % 100 mL IVPB  Status:  Discontinued        2 g 200 mL/hr over 30 Minutes Intravenous Every 8 hours 09/13/20 1437 09/14/20 0359   09/13/20 1130  vancomycin (VANCOREADY) IVPB 1750 mg/350 mL        1,750 mg 175 mL/hr over 120 Minutes Intravenous  Once 09/13/20 1107 09/13/20 1414   09/13/20 1045  ceFAZolin (ANCEF) IVPB 1 g/50 mL premix        1 g 100 mL/hr over 30 Minutes Intravenous  Once 09/13/20 1031 09/13/20 1307       DVT  prophylaxis: SCDs  Code Status: Full code  Family Communication: Discussed with patient wife at bedside   Consultants:  General surgery  Orthopedics  Infectious disease  Interventional radiology  Procedures:  TOTAL HIP ARTHROPLASTY WITH CEMENT INTERPOSITION SPACING for septic hip arthritis from septicemia in the setting of alcoholic AVN of the left hip  Drainage of the abscess with 3 drain placement by IR in iliopsoas muscle and multiple abscesses in the legs     Objective   Vitals:   09/25/20 0800 09/25/20 0821 09/25/20 0900 09/25/20 1000  BP:  124/72 112/66 121/63  Pulse: (!) 116 (!) 123 (!) 117 (!) 115  Resp: 20 (!) 25 (!) 27 (!) 26  Temp: 98.1 F (36.7 C)     TempSrc: Oral     SpO2: 99% 97% 96% 100%  Weight:      Height:        Intake/Output Summary (Last 24 hours) at 09/25/2020 1041 Last data filed at 09/25/2020 0600 Gross per 24 hour  Intake 297.33 ml  Output 1400 ml  Net -1102.67 ml    03/04 1901 - 03/06 0700 In: 497.3  Out: 1800 [Urine:1100; Drains:700]  Filed Weights   09/19/20 0500 09/19/20 0843 09/21/20 0500  Weight: 91.8 kg 97.8 kg 93.9 kg    Physical Examination:   General-appears in no acute distress Heart-S1-S2, regular, no murmur auscultated Lungs-clear to auscultation bilaterally, no wheezing or crackles auscultated Abdomen-soft, nontender, no organomegaly Extremities-left upper thigh swelling has improved, wound VAC in place Neuro-alert, oriented x3, no focal deficit noted  Status is: Inpatient  Dispo: The patient is from: Home              Anticipated d/c is to: Skilled nursing facility              Anticipated d/c date is: 09/29/2020              Patient currently not stable for discharge  Barrier to discharge-ongoing management for iliopsoas abscess       Data Reviewed:  No results found for this or any previous visit (from the past 240 hour(s)).  No results for input(s): LIPASE, AMYLASE in the last 168 hours. No  results for input(s): AMMONIA in the last 168 hours.  Cardiac Enzymes: No results for input(s): CKTOTAL, CKMB, CKMBINDEX, TROPONINI in the last 168 hours. BNP (last 3 results) No results for input(s): BNP in the last 8760 hours.  ProBNP (last 3 results) No results for input(s): PROBNP in the last 8760 hours.  Studies:  CT IMAGE GUIDED DRAINAGE BY PERCUTANEOUS CATHETER  Result Date: 09/23/2020 INDICATION: Septic arthritis of the left hip status post multiple IR drain placement. Persistent fluid collection present in the left mid thigh. Interventional radiology consulted for drain placement. EXAM: CT GUIDED DRAINAGE OF LEFT MID THIGH ABSCESS MEDICATIONS: The patient is currently admitted to the hospital and receiving intravenous antibiotics. The antibiotics were administered within an appropriate time frame prior to the initiation of the procedure. ANESTHESIA/SEDATION: 4 mg IV Versed 100 mcg IV Fentanyl Moderate Sedation Time:  23 The patient was continuously monitored during the procedure by the interventional radiology nurse under my direct supervision. COMPLICATIONS: None immediate. TECHNIQUE: Informed written consent was obtained from the patient after a thorough discussion of the procedural risks, benefits and alternatives. All questions were addressed. Maximal Sterile Barrier Technique was utilized including caps, mask, sterile gowns, sterile gloves, sterile drape, hand hygiene and skin antiseptic. A timeout was performed prior to the initiation of the procedure. PROCEDURE: The left mid thigh was prepped with Chlorhexidine in a sterile fashion, and a sterile drape was applied covering the operative field. A sterile gown and sterile gloves were used for the procedure. Local anesthesia was provided with 1% Lidocaine. Patient positioned supine on the procedure table. Following local lidocaine the striation, 18 gauge trocar needle advanced into the left mid thigh fluid collection utilizing CT guidance.  The 18 gauge needle was exchanged for 10.2 [place] multipurpose pigtail drain over 0.035 inch guidewire. Minimal serosanguineous output from the drain. Drain was secured to skin with suture and connected to bulb suction. The patient was transferred to the ultrasound department. Ultrasound examination confirmed appropriate positioning of the drain within the left anterior mid thigh collection. The collection appeared quite heterogeneous on ultrasound examination which explains the minimal output from the drain. FINDINGS: Contrast-enhanced CT of the left mid thigh demonstrated rim enhancing collection in the anterior compartment. IMPRESSION: 10.2 [place] multipurpose pigtail drain placed in left mid thigh collection. Position of drain confirmed both with CT and ultrasound. On ultrasound evaluation the collection appears quite complex which explains the minimal drain output. Electronically Signed   By: Acquanetta BellingFarhaan  Mir M.D.   On: 09/23/2020 16:34       Bertine Schlottman S Zaharah Amir   [facility] If 7PM-7AM, please contact night-coverage at [email or web address], Office  [phone]   09/25/2020, 10:41 AM  LOS: 12 days

## 2020-09-25 NOTE — Progress Notes (Signed)
Pt care escalated to red MEWS, temp now 101.5, BP is soft at 104/64, P 119, O2 sat at 96% . Marland Kitchen He is awake and alert, cool n dry, C/os of mild pain in left leg. Dr Sharl Ma and Dr Derrell Lolling notified, orders implemented. Spoke w pt and visitor to let them know we will monitor him more closely, and make adjustments as needed. Questions answered and support offered. Will cont to monitor.

## 2020-09-25 NOTE — Progress Notes (Signed)
3 Days Post-Op   Subjective/Chief Complaint: Pt doing well    Objective: Vital signs in last 24 hours: Temp:  [98.5 F (36.9 C)-100.3 F (37.9 C)] 98.5 F (36.9 C) (03/06 0400) Pulse Rate:  [94-123] 119 (03/06 0700) Resp:  [16-30] 21 (03/06 0700) BP: (102-122)/(51-83) 116/66 (03/06 0700) SpO2:  [94 %-100 %] 97 % (03/06 0700) Last BM Date: 09/23/20  Intake/Output from previous day: 03/05 0701 - 03/06 0700 In: 397.3 [IV Piggyback:397.3] Out: 1400 [Urine:800; Drains:600] Intake/Output this shift: No intake/output data recorded.  PE:  Constitutional: No acute distress, conversant, appears states age. Eyes: Anicteric sclerae, moist conjunctiva, no lid lag Lungs: Clear to auscultation bilaterally, normal respiratory effort CV: regular rate and rhythm, no murmurs, no peripheral edema, pedal pulses 2+ GI: Soft, no masses or hepatosplenomegaly, non-tender to palpation Skin: No rashes, palpation reveals normal turgor, inc c/d/i, base of wound clean Psychiatric: appropriate judgment and insight, oriented to person, place, and time  Lab Results:  Recent Labs    09/24/20 0912 09/25/20 0247  WBC 18.5* 15.1*  HGB 7.2* 7.5*  HCT 21.9* 23.3*  PLT 145* 155   BMET Recent Labs    09/24/20 0912 09/25/20 0247  NA 125* 127*  K 4.7 5.3*  CL 94* 96*  CO2 24 25  GLUCOSE 112* 92  BUN 12 11  CREATININE 0.41* 0.44*  CALCIUM 7.4* 7.6*   PT/INR No results for input(s): LABPROT, INR in the last 72 hours. ABG No results for input(s): PHART, HCO3 in the last 72 hours.  Invalid input(s): PCO2, PO2  Studies/Results: CT IMAGE GUIDED DRAINAGE BY PERCUTANEOUS CATHETER  Result Date: 09/23/2020 INDICATION: Septic arthritis of the left hip status post multiple IR drain placement. Persistent fluid collection present in the left mid thigh. Interventional radiology consulted for drain placement. EXAM: CT GUIDED DRAINAGE OF LEFT MID THIGH ABSCESS MEDICATIONS: The patient is currently admitted  to the hospital and receiving intravenous antibiotics. The antibiotics were administered within an appropriate time frame prior to the initiation of the procedure. ANESTHESIA/SEDATION: 4 mg IV Versed 100 mcg IV Fentanyl Moderate Sedation Time:  23 The patient was continuously monitored during the procedure by the interventional radiology nurse under my direct supervision. COMPLICATIONS: None immediate. TECHNIQUE: Informed written consent was obtained from the patient after a thorough discussion of the procedural risks, benefits and alternatives. All questions were addressed. Maximal Sterile Barrier Technique was utilized including caps, mask, sterile gowns, sterile gloves, sterile drape, hand hygiene and skin antiseptic. A timeout was performed prior to the initiation of the procedure. PROCEDURE: The left mid thigh was prepped with Chlorhexidine in a sterile fashion, and a sterile drape was applied covering the operative field. A sterile gown and sterile gloves were used for the procedure. Local anesthesia was provided with 1% Lidocaine. Patient positioned supine on the procedure table. Following local lidocaine the striation, 18 gauge trocar needle advanced into the left mid thigh fluid collection utilizing CT guidance. The 18 gauge needle was exchanged for 10.2 [place] multipurpose pigtail drain over 0.035 inch guidewire. Minimal serosanguineous output from the drain. Drain was secured to skin with suture and connected to bulb suction. The patient was transferred to the ultrasound department. Ultrasound examination confirmed appropriate positioning of the drain within the left anterior mid thigh collection. The collection appeared quite heterogeneous on ultrasound examination which explains the minimal output from the drain. FINDINGS: Contrast-enhanced CT of the left mid thigh demonstrated rim enhancing collection in the anterior compartment. IMPRESSION: 10.2 [place] multipurpose pigtail drain  placed in left mid  thigh collection. Position of drain confirmed both with CT and ultrasound. On ultrasound evaluation the collection appears quite complex which explains the minimal drain output. Electronically Signed   By: Acquanetta Belling M.D.   On: 09/23/2020 16:34    Anti-infectives: Anti-infectives (From admission, onward)   Start     Dose/Rate Route Frequency Ordered Stop   09/18/20 1400  metroNIDAZOLE (FLAGYL) tablet 500 mg        500 mg Oral Every 8 hours 09/18/20 0927     09/15/20 1715  tobramycin (NEBCIN) powder  Status:  Discontinued          As needed 09/15/20 1715 09/15/20 2043   09/15/20 1715  vancomycin (VANCOCIN) powder  Status:  Discontinued          As needed 09/15/20 1716 09/15/20 2043   09/14/20 0600  ceFAZolin (ANCEF) IVPB 2g/100 mL premix        2 g 200 mL/hr over 30 Minutes Intravenous Every 8 hours 09/14/20 0359     09/13/20 2200  vancomycin (VANCOREADY) IVPB 1500 mg/300 mL  Status:  Discontinued        1,500 mg 150 mL/hr over 120 Minutes Intravenous Every 12 hours 09/13/20 1636 09/14/20 0359   09/13/20 1500  ceFEPIme (MAXIPIME) 2 g in sodium chloride 0.9 % 100 mL IVPB  Status:  Discontinued        2 g 200 mL/hr over 30 Minutes Intravenous Every 8 hours 09/13/20 1437 09/14/20 0359   09/13/20 1130  vancomycin (VANCOREADY) IVPB 1750 mg/350 mL        1,750 mg 175 mL/hr over 120 Minutes Intravenous  Once 09/13/20 1107 09/13/20 1414   09/13/20 1045  ceFAZolin (ANCEF) IVPB 1 g/50 mL premix        1 g 100 mL/hr over 30 Minutes Intravenous  Once 09/13/20 1031 09/13/20 1307      Assessment/Plan: Symptomatic anemia  Sinus tachycardia, chronic  Thrombocytopenia - Plts 155 today, monitor PMH alcoholic hepatitis Hepatic cirrhosis with ascites GAD GERD Anemia - hgb 7.4   PMH AVN  Septic shock due to below on admission -now resolved s/p below procedures and IV abx MSSA bacteremia Septic arthritis of the left hip Iliopsoas and left proximal and medial thigh abscesses  09/14/20  Placement 14 F IR perc drain into left RP abscess, two 12 F drains placed in L thigh (Cx - staph) 09/15/20 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 Sena Slate 09/20/20 Left thigh irrigation and debridement with wound vac placement, Dr. Dion Saucier  -BID dressing changes to left groin wound. If this is clean enough, next week when he gets his thigh VAC changed, WOC may be able to place a VAC on the groin wound and connect via a Y connector. Will assess at that time. -WBC trending up  FEN -regular diet VTE -none currently ID -flagyl, ancef  LOS: 12 days    Axel Filler 09/25/2020

## 2020-09-26 DIAGNOSIS — F1029 Alcohol dependence with unspecified alcohol-induced disorder: Secondary | ICD-10-CM | POA: Diagnosis not present

## 2020-09-26 DIAGNOSIS — K7010 Alcoholic hepatitis without ascites: Secondary | ICD-10-CM | POA: Diagnosis not present

## 2020-09-26 DIAGNOSIS — R7881 Bacteremia: Secondary | ICD-10-CM | POA: Diagnosis not present

## 2020-09-26 DIAGNOSIS — K7030 Alcoholic cirrhosis of liver without ascites: Secondary | ICD-10-CM | POA: Diagnosis not present

## 2020-09-26 DIAGNOSIS — L02416 Cutaneous abscess of left lower limb: Secondary | ICD-10-CM | POA: Diagnosis not present

## 2020-09-26 DIAGNOSIS — R509 Fever, unspecified: Secondary | ICD-10-CM

## 2020-09-26 DIAGNOSIS — B9561 Methicillin susceptible Staphylococcus aureus infection as the cause of diseases classified elsewhere: Secondary | ICD-10-CM | POA: Diagnosis not present

## 2020-09-26 LAB — CBC
HCT: 22 % — ABNORMAL LOW (ref 39.0–52.0)
Hemoglobin: 7.1 g/dL — ABNORMAL LOW (ref 13.0–17.0)
MCH: 33.2 pg (ref 26.0–34.0)
MCHC: 32.3 g/dL (ref 30.0–36.0)
MCV: 102.8 fL — ABNORMAL HIGH (ref 80.0–100.0)
Platelets: 159 10*3/uL (ref 150–400)
RBC: 2.14 MIL/uL — ABNORMAL LOW (ref 4.22–5.81)
RDW: 18 % — ABNORMAL HIGH (ref 11.5–15.5)
WBC: 14.8 10*3/uL — ABNORMAL HIGH (ref 4.0–10.5)
nRBC: 0.1 % (ref 0.0–0.2)

## 2020-09-26 LAB — COMPREHENSIVE METABOLIC PANEL
ALT: 8 U/L (ref 0–44)
AST: 28 U/L (ref 15–41)
Albumin: 1.8 g/dL — ABNORMAL LOW (ref 3.5–5.0)
Alkaline Phosphatase: 195 U/L — ABNORMAL HIGH (ref 38–126)
Anion gap: 6 (ref 5–15)
BUN: 10 mg/dL (ref 6–20)
CO2: 23 mmol/L (ref 22–32)
Calcium: 7.3 mg/dL — ABNORMAL LOW (ref 8.9–10.3)
Chloride: 98 mmol/L (ref 98–111)
Creatinine, Ser: 0.36 mg/dL — ABNORMAL LOW (ref 0.61–1.24)
GFR, Estimated: >60 mL/min (ref >60)
Glucose, Bld: 91 mg/dL (ref 70–99)
Potassium: 4.5 mmol/L (ref 3.5–5.1)
Sodium: 127 mmol/L — ABNORMAL LOW (ref 135–145)
Total Bilirubin: 1 mg/dL (ref 0.3–1.2)
Total Protein: 5 g/dL — ABNORMAL LOW (ref 6.5–8.1)

## 2020-09-26 LAB — PREPARE RBC (CROSSMATCH)

## 2020-09-26 MED ORDER — SODIUM CHLORIDE 0.9 % IV SOLN
300.0000 mg | Freq: Two times a day (BID) | INTRAVENOUS | Status: DC
  Administered 2020-09-26 – 2020-09-29 (×5): 300 mg via INTRAVENOUS
  Filled 2020-09-26 (×10): qty 300

## 2020-09-26 MED ORDER — SODIUM CHLORIDE 0.9% IV SOLUTION: Freq: Once | INTRAVENOUS | Status: DC

## 2020-09-26 NOTE — Progress Notes (Signed)
   09/25/20 1944  Assess: MEWS Score  Temp (!) 100.8 F (38.2 C)  BP 119/69  Pulse Rate (!) 125  Resp 19  SpO2 95 %  O2 Device Room Air  Assess: MEWS Score  MEWS Temp 1  MEWS Systolic 0  MEWS Pulse 2  MEWS RR 0  MEWS LOC 0  MEWS Score 3  MEWS Score Color Yellow  Assess: if the MEWS score is Yellow or Red  Were vital signs taken at a resting state? Yes  Focused Assessment No change from prior assessment  Early Detection of Sepsis Score *See Row Information* Low  MEWS guidelines implemented *See Row Information* Yes  Treat  MEWS Interventions Administered prn meds/treatments  Take Vital Signs  Increase Vital Sign Frequency  Yellow: Q 2hr X 2 then Q 4hr X 2, if remains yellow, continue Q 4hrs  Escalate  MEWS: Escalate Yellow: discuss with charge nurse/RN and consider discussing with provider and RRT  Notify: Charge Nurse/RN  Name of Charge Nurse/RN Notified Richelle RN  Date Charge Nurse/RN Notified 09/25/20  Time Charge Nurse/RN Notified 1945  Notify: Provider  Provider Name/Title Dr Sharl Ma by Rosey Bath RN  Date Provider Notified 09/25/20  Time Provider Notified 1800  Notification Type Page  Notification Reason Change in status  Provider response See new orders  Date of Provider Response 09/26/20  Time of Provider Response 1811  Document  Patient Outcome Stabilized after interventions  Progress note created (see row info) Yes

## 2020-09-26 NOTE — Progress Notes (Signed)
Inpatient Rehab Admissions Coordinator:   Note plans for further I &D. Pt. Is not medically for CIR.Will continue to follow for medical readiness and progress with therapies and open a case with Pt's insurance once he is more stable and participating more with therapies.   Megan Salon, MS, CCC-SLP Rehab Admissions Coordinator  [phone] (celll) [phone] (office)

## 2020-09-26 NOTE — Progress Notes (Signed)
4 Days Post-Op   Subjective/Chief Complaint: Pt doing ok today.  Eating breakfast.   Objective: Vital signs in last 24 hours: Temp:  [98 F (36.7 C)-101.5 F (38.6 C)] 98.2 F (36.8 C) (03/07 0845) Pulse Rate:  [108-125] 120 (03/07 0845) Resp:  [18-26] 18 (03/07 0845) BP: (104-121)/(63-80) 113/67 (03/07 0845) SpO2:  [93 %-100 %] 96 % (03/07 0845) Last BM Date: 09/25/20  Intake/Output from previous day: 03/06 0701 - 03/07 0700 In: 3786.7 [P.O.:2160; I.V.:1326.9; IV Piggyback:299.9] Out: 1401 [Urine:1200; Drains:200; Stool:1] Intake/Output this shift: Total I/O In: -  Out: 301 [Urine:100; Drains:200; Stool:1]  PE: Skin: left groin still with erythema.  Wound is mostly fibrinous type exudate with some purulent/dirty dishwater type drainage.  Wound tracks very medially and now there is a new area of skin breakdown medial posterior to his current wound that clears connects to the surgically created wound.  Scrotum is still hyperemic and mildly indurated especially on the left side  Lab Results:  Recent Labs    09/25/20 0247 09/26/20 0502  WBC 15.1* 14.8*  HGB 7.5* 7.1*  HCT 23.3* 22.0*  PLT 155 159   BMET Recent Labs    09/25/20 0247 09/26/20 0502  NA 127* 127*  K 5.3* 4.5  CL 96* 98  CO2 25 23  GLUCOSE 92 91  BUN 11 10  CREATININE 0.44* 0.36*  CALCIUM 7.6* 7.3*   PT/INR No results for input(s): LABPROT, INR in the last 72 hours. ABG No results for input(s): PHART, HCO3 in the last 72 hours.  Invalid input(s): PCO2, PO2  Studies/Results: No results found.  Anti-infectives: Anti-infectives (From admission, onward)   Start     Dose/Rate Route Frequency Ordered Stop   09/18/20 1400  metroNIDAZOLE (FLAGYL) tablet 500 mg        500 mg Oral Every 8 hours 09/18/20 0927     09/15/20 1715  tobramycin (NEBCIN) powder  Status:  Discontinued          As needed 09/15/20 1715 09/15/20 2043   09/15/20 1715  vancomycin (VANCOCIN) powder  Status:  Discontinued           As needed 09/15/20 1716 09/15/20 2043   09/14/20 0600  ceFAZolin (ANCEF) IVPB 2g/100 mL premix        2 g 200 mL/hr over 30 Minutes Intravenous Every 8 hours 09/14/20 0359     09/13/20 2200  vancomycin (VANCOREADY) IVPB 1500 mg/300 mL  Status:  Discontinued        1,500 mg 150 mL/hr over 120 Minutes Intravenous Every 12 hours 09/13/20 1636 09/14/20 0359   09/13/20 1500  ceFEPIme (MAXIPIME) 2 g in sodium chloride 0.9 % 100 mL IVPB  Status:  Discontinued        2 g 200 mL/hr over 30 Minutes Intravenous Every 8 hours 09/13/20 1437 09/14/20 0359   09/13/20 1130  vancomycin (VANCOREADY) IVPB 1750 mg/350 mL        1,750 mg 175 mL/hr over 120 Minutes Intravenous  Once 09/13/20 1107 09/13/20 1414   09/13/20 1045  ceFAZolin (ANCEF) IVPB 1 g/50 mL premix        1 g 100 mL/hr over 30 Minutes Intravenous  Once 09/13/20 1031 09/13/20 1307      Assessment/Plan: Symptomatic anemia  Sinus tachycardia, chronic  Thrombocytopenia - Plts 155 today, monitor PMH alcoholic hepatitis Hepatic cirrhosis with ascites GAD GERD Anemia - hgb 7.1  PMH AVN  Septic shock due to below on admission -now resolved s/p below  procedures and IV abx MSSA bacteremia Septic arthritis of the left hip Iliopsoas and left proximal and medial thigh abscesses  09/14/20 Placement 14 F IR perc drain into left RP abscess, two 12 F drains placed in L thigh (Cx - staph) 09/15/20 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 Sena Slate 09/20/20 Left thigh irrigation and debridement with wound vac placement, Dr. Dion Saucier  -BID dressing changes to left groin wound. -unfortunately wound doesn't look good and there is further area of infection and skin breakdown that will definitely need further debridement in the OR.  This was discussed with the patient and his GF at bedside.  All questions were answered. -I spoke to [place], [name] with Dr. Dion Saucier.  He is returning to OR tomorrow with them for VAC change, etc.   We will join with them tomorrow as he has eaten today. -WBC stable currently  FEN -regular diet, NPO p MN VTE -none currently ID -flagyl, ancef  LOS: 13 days    Letha Cape 09/26/2020

## 2020-09-26 NOTE — Progress Notes (Signed)
Referring Physician(s): Cornett,T/Lama,G  Supervising Physician: Dr. Deanne Coffer  Patient Status:  [facility] - In-pt  Chief Complaint: Left hip/thigh pain/abscesses   Subjective: Pt feels okay. Fevers have subsided. Still having some left thigh/hip discomfort as expected   Allergies: Patient has no active allergies.  Medications:  Current Facility-Administered Medications:  .  (feeding supplement) PROSource Plus liquid 30 mL, 30 mL, Oral, BID BM, Cornett, Thomas, MD, 30 mL at 09/26/20 1028 .  0.9 %  sodium chloride infusion (Manually program via Guardrails IV Fluids), , Intravenous, Once, Cornett, Thomas, MD .  0.9 %  sodium chloride infusion (Manually program via Guardrails IV Fluids), , Intravenous, Once, [place], Sarina Ill, MD .  acetaminophen (TYLENOL) tablet 650 mg, 650 mg, Oral, Q6H PRN, 650 mg at 09/26/20 0511 **OR** acetaminophen (TYLENOL) suppository 650 mg, 650 mg, Rectal, Q6H PRN, Cornett, Thomas, MD .  carvedilol (COREG) tablet 6.25 mg, 6.25 mg, Oral, BID WC, Cornett, Thomas, MD, 6.25 mg at 09/26/20 1027 .  ceFAZolin (ANCEF) IVPB 2g/100 mL premix, 2 g, Intravenous, Q8H, Cornett, Thomas, MD, Last Rate: 200 mL/hr at 09/26/20 0512, 2 g at 09/26/20 0512 .  Chlorhexidine Gluconate Cloth 2 % PADS 6 each, 6 each, Topical, Daily, Cornett, Thomas, MD, 6 each at 09/25/20 [phone] .  docusate sodium (COLACE) capsule 100 mg, 100 mg, Oral, BID, Cornett, Thomas, MD, 100 mg at 09/25/20 2153 .  feeding supplement (BOOST / RESOURCE BREEZE) liquid 1 Container, 1 Container, Oral, BID BM, Harriette Bouillon, MD, 1 Container at 09/25/20 1547 .  fentaNYL (SUBLIMAZE) injection, , Intravenous, PRN, Mir, Al Corpus, MD, 50 mcg at 09/23/20 1454 .  folic acid (FOLVITE) tablet 1 mg, 1 mg, Oral, Daily, Cornett, Thomas, MD, 1 mg at 09/26/20 1028 .  HYDROmorphone (DILAUDID) injection 1 mg, 1 mg, Intravenous, Q4H PRN, Meredeth Ide, MD, 1 mg at 09/25/20 1048 .  lactulose (CHRONULAC) 10 GM/15ML solution 10 g, 10 g,  Oral, BID, Cornett, Thomas, MD, 10 g at 09/26/20 1027 .  MEDLINE mouth rinse, 15 mL, Mouth Rinse, BID, Cornett, Thomas, MD, 15 mL at 09/25/20 2154 .  metroNIDAZOLE (FLAGYL) tablet 500 mg, 500 mg, Oral, Q8H, Cornett, Thomas, MD, 500 mg at 09/26/20 0511 .  midazolam (VERSED) injection, , Intravenous, PRN, Mir, Al Corpus, MD, 1 mg at 09/23/20 1505 .  multivitamin with minerals tablet 1 tablet, 1 tablet, Oral, Daily, Cornett, Thomas, MD, 1 tablet at 09/26/20 1027 .  nutrition supplement (JUVEN) (JUVEN) powder packet 1 packet, 1 packet, Oral, BID BM, Cornett, Thomas, MD, 1 packet at 09/25/20 2200 .  ondansetron (ZOFRAN) tablet 4 mg, 4 mg, Oral, Q6H PRN **OR** ondansetron (ZOFRAN) injection 4 mg, 4 mg, Intravenous, Q6H PRN, Cornett, Thomas, MD .  oxyCODONE (Oxy IR/ROXICODONE) immediate release tablet 5 mg, 5 mg, Oral, Q4H PRN, Meredeth Ide, MD, 5 mg at 09/26/20 1029 .  pantoprazole (PROTONIX) EC tablet 40 mg, 40 mg, Oral, BID, Cornett, Thomas, MD, 40 mg at 09/26/20 1027 .  polyethylene glycol (MIRALAX / GLYCOLAX) packet 17 g, 17 g, Oral, Daily PRN, Cornett, Thomas, MD .  rifampin (RIFADIN) 300 mg in sodium chloride 0.9 % 100 mL IVPB, 300 mg, Intravenous, Q12H, [place], Belia Heman, MD .  sertraline (ZOLOFT) tablet 25 mg, 25 mg, Oral, Daily, Cornett, Thomas, MD, 25 mg at 09/26/20 1027 .  sodium chloride flush (NS) 0.9 % injection 5 mL, 5 mL, Intracatheter, Q8H, Cornett, Thomas, MD, 5 mL at 09/26/20 0518 .  sodium chloride flush (NS) 0.9 % injection  5 mL, 5 mL, Intracatheter, Q8H, Mir, Al Corpus, MD, 5 mL at 09/26/20 0518 .  thiamine tablet 100 mg, 100 mg, Oral, Daily, Cornett, Thomas, MD, 100 mg at 09/26/20 1028    Vital Signs: BP 113/67 (BP Location: Right Arm)   Pulse (!) 120   Temp 98.2 F (36.8 C) (Oral)   Resp 18   Ht 5\' 10"  (1.778 m)   Wt 93.9 kg   SpO2 96%   BMI 29.70 kg/m   Physical Exam awake/alert; left thigh/LLQ drains intact, dressings dry Newest drain intact, thin serosanguinous  output  Imaging: CT IMAGE GUIDED DRAINAGE BY PERCUTANEOUS CATHETER  Result Date: 09/23/2020 INDICATION: Septic arthritis of the left hip status post multiple IR drain placement. Persistent fluid collection present in the left mid thigh. Interventional radiology consulted for drain placement. EXAM: CT GUIDED DRAINAGE OF LEFT MID THIGH ABSCESS MEDICATIONS: The patient is currently admitted to the hospital and receiving intravenous antibiotics. The antibiotics were administered within an appropriate time frame prior to the initiation of the procedure. ANESTHESIA/SEDATION: 4 mg IV Versed 100 mcg IV Fentanyl Moderate Sedation Time:  23 The patient was continuously monitored during the procedure by the interventional radiology nurse under my direct supervision. COMPLICATIONS: None immediate. TECHNIQUE: Informed written consent was obtained from the patient after a thorough discussion of the procedural risks, benefits and alternatives. All questions were addressed. Maximal Sterile Barrier Technique was utilized including caps, mask, sterile gowns, sterile gloves, sterile drape, hand hygiene and skin antiseptic. A timeout was performed prior to the initiation of the procedure. PROCEDURE: The left mid thigh was prepped with Chlorhexidine in a sterile fashion, and a sterile drape was applied covering the operative field. A sterile gown and sterile gloves were used for the procedure. Local anesthesia was provided with 1% Lidocaine. Patient positioned supine on the procedure table. Following local lidocaine the striation, 18 gauge trocar needle advanced into the left mid thigh fluid collection utilizing CT guidance. The 18 gauge needle was exchanged for 10.2 11/23/2020 multipurpose pigtail drain over 0.035 inch guidewire. Minimal serosanguineous output from the drain. Drain was secured to skin with suture and connected to bulb suction. The patient was transferred to the ultrasound department. Ultrasound examination confirmed  appropriate positioning of the drain within the left anterior mid thigh collection. The collection appeared quite heterogeneous on ultrasound examination which explains the minimal output from the drain. FINDINGS: Contrast-enhanced CT of the left mid thigh demonstrated rim enhancing collection in the anterior compartment. IMPRESSION: 10.2 [place] multipurpose pigtail drain placed in left mid thigh collection. Position of drain confirmed both with CT and ultrasound. On ultrasound evaluation the collection appears quite complex which explains the minimal drain output. Electronically Signed   By: [place] M.D.   On: 09/23/2020 16:34    Labs:  CBC: Recent Labs    09/23/20 0939 09/24/20 0912 09/25/20 0247 09/26/20 0502  WBC 14.7* 18.5* 15.1* 14.8*  HGB 7.4* 7.2* 7.5* 7.1*  HCT 22.7* 21.9* 23.3* 22.0*  PLT 141* 145* 155 159    COAGS: Recent Labs    08/22/20 1817 08/25/20 0349 09/13/20 1045 09/14/20 0525 09/15/20 2300 09/19/20 0305  INR 1.4*   < > 1.4* 1.5* 2.2* 1.5*  APTT 42*  --   --   --   --   --    < > = values in this interval not displayed.    BMP: Recent Labs    09/23/20 0243 09/24/20 0912 09/25/20 0247 09/26/20 0502  NA 127* 125*  127* 127*  K 5.0 4.7 5.3* 4.5  CL 96* 94* 96* 98  CO2 23 24 25 23   GLUCOSE 142* 112* 92 91  BUN 11 12 11 10   CALCIUM 7.5* 7.4* 7.6* 7.3*  CREATININE 0.44* 0.41* 0.44* 0.36*  GFRNONAA >60 >60 >60 >60    LIVER FUNCTION TESTS: Recent Labs    09/23/20 0243 09/24/20 0912 09/25/20 0247 09/26/20 0502  BILITOT 1.0 0.9 1.1 1.0  AST 31 32 31 28  ALT 9 9 8 8   ALKPHOS 211* 203* 198* 195*  PROT 5.2* 5.0* 5.2* 5.0*  ALBUMIN 1.9* 1.9* 1.8* 1.8*    Assessment and Plan: Pt with history of left hip septic arthritis/AVN complicated by development of left retroperitoneal and left thigh fluid collections concerning for abscesses now s/p drain placement x3 (medial left thigh x 2, lateral left thigh) Mostly serosanguinous output IR  following.   Electronically Signed: 11/25/20, PA-C 09/26/2020, 1:18 PM   I spent a total of 15 minutes at the the patient's bedside AND on the patient's hospital floor or unit, greater than 50% of which was counseling/coordinating care for left thigh abscess drains    Patient ID: , male   DOB: 1985/08/11, 35 y.o.   MRN: [name]

## 2020-09-26 NOTE — Progress Notes (Signed)
Physical Therapy Treatment Patient Details Name: [name] MRN: [id number] DOB: 03/03/1986 Today's Date: 09/26/2020    History of Present Illness [name] is a 35 y.o. male with medical history significant of AVN of hip ( originally was scheduled for a THA this coming March2022), HTN, GERD. Recent admission for COVID , acute renal failure and alcoholic hepatitis (08/22/20- 08/29/20). He was ok for a few days after discharge, but he began to feel very tired, weak, left thigh and leg pain and progressed over the last 2 weeks. He reports that the left thigh had become increasingly swollen and tender to touch. He has had a blister rupture on the inner thigh in the groin area. These symptoms increased through 2 days ago when he was no longer ago to walk without great pain. 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 Left thigh I & D and wound vac placement 3/1. s/p I&D groin abcess by general surgery 09/23/19. Additional drain placed L thigh per IR 09/23/20    PT Comments    General bed mobility comments: Max Assist + 2 with use of bed pad to complete swival/scooting due to pain level and swelling.  Pt sat EOB x 6 min c/o dizziness.  BP was 127/69.  Pain 10/10.  RN in room a gave meds.  Assisted back to supine + 2 assist and positioned to comfort.  Session limited by pain level.     Follow Up Recommendations  CIR     Equipment Recommendations  Rolling walker with 5" wheels;3in1 (PT)    Recommendations for Other Services Rehab consult     Precautions / Restrictions Precautions Precautions: Fall;Posterior Hip Precaution Comments: pre medicate for pain, multiple  lines/drains Restrictions Weight Bearing Restrictions: No LLE Weight Bearing: Weight bearing as tolerated    Mobility  Bed Mobility Overal bed mobility: Needs Assistance Bed Mobility: Supine to Sit;Sit to Supine     Supine to sit: Max assist;+2 for physical assistance;+2 for safety/equipment Sit to supine: Max assist;+2 for physical assistance;+2 for safety/equipment   General bed mobility comments: Max Assist + 2 with use of bed pad to complete swival/scooting due to pain level and swelling.  Pt sat EOB x 6 min c/o dizziness.  BP was 127/69.  Pain 10/10.  RN in room a gave meds.    Transfers                    Ambulation/Gait                 Stairs             Wheelchair Mobility    Modified Rankin (Stroke Patients Only)       Balance                                            Cognition Arousal/Alertness: Awake/alert Behavior During Therapy: WFL for tasks assessed/performed Overall Cognitive Status: Within Functional Limits for tasks assessed  General Comments: AxO x 3      Exercises      General Comments        Pertinent Vitals/Pain Pain Assessment: Faces Faces Pain Scale: Hurts whole lot Pain Location: Lt hip and scrotal edema Pain Descriptors / Indicators: Discomfort;Grimacing;Operative site guarding;Guarding Pain Intervention(s): Monitored during session;Repositioned;Patient requesting pain meds-RN notified;RN gave pain meds during session    Home Living                      Prior Function            PT Goals (current goals can now be found in the care plan section) Progress towards PT goals: Progressing toward goals    Frequency    Min 4X/week      PT Plan Current plan remains appropriate    Co-evaluation              AM-PAC PT "6 Clicks" Mobility   Outcome Measure  Help needed turning from your back to your side while in a flat  bed without using bedrails?: A Lot Help needed moving from lying on your back to sitting on the side of a flat bed without using bedrails?: A Lot Help needed moving to and from a bed to a chair (including a wheelchair)?: Total Help needed standing up from a chair using your arms (e.g., wheelchair or bedside chair)?: Total Help needed to walk in hospital room?: Total Help needed climbing 3-5 steps with a railing? : Total 6 Click Score: 8    End of Session Equipment Utilized During Treatment: Gait belt Activity Tolerance: Patient limited by pain Patient left: in bed;with call bell/phone within reach;with nursing/sitter in room Nurse Communication: Mobility status PT Visit Diagnosis: Other abnormalities of gait and mobility (R26.89);Muscle weakness (generalized) (M62.81);Pain Pain - Right/Left: Left Pain - part of body: Hip     Time: 1425-1450 PT Time Calculation (min) (ACUTE ONLY): 25 min  Charges:  $Therapeutic Activity: 23-37 mins                     Felecia Shelling  PTA Acute  Rehabilitation Services Pager      [phone] Office      [phone]

## 2020-09-26 NOTE — Progress Notes (Signed)
Subjective:   Patient is alert, oriented, laying in bed. Patient reports pain as moderate at left thigh and groin, worse with movement. Denies chest pain, SOB, Calf pain. No nausea/vomiting.  Objective:  PE: VITALS:   Vitals:   09/25/20 1944 09/25/20 2044 09/26/20 0030 09/26/20 0436  BP: 119/69 114/71 113/68 107/70  Pulse: (!) 125 (!) 115 (!) 109 (!) 108  Resp: 19 19 18 18   Temp: (!) 100.8 F (38.2 C) 98.7 F (37.1 C) 98.1 F (36.7 C) 98 F (36.7 C)  TempSrc: Oral Oral Oral Oral  SpO2: 95% 93% 97%   Weight:      Height:       General: alert, oriented, sitting up in bed Resp: no increased respiratory effort MSK: No left calf continues to be TTP. Sensation intact to all aspects of left foot. + DP pulse. Dorsiflexion/Plantar flexion intact. Wound vac in place with approx 750 cc's bloody drainage in canister, good seal.   LABS  Results for orders placed or performed during the hospital encounter of 09/13/20 (from the past 24 hour(s))  CBC     Status: Abnormal   Collection Time: 09/26/20  5:02 AM  Result Value Ref Range   WBC 14.8 (H) 4.0 - 10.5 K/uL   RBC 2.14 (L) 4.22 - 5.81 MIL/uL   Hemoglobin 7.1 (L) 13.0 - 17.0 g/dL   HCT 11/26/20 (L) 16.1 - 09.6 %   MCV 102.8 (H) 80.0 - 100.0 fL   MCH 33.2 26.0 - 34.0 pg   MCHC 32.3 30.0 - 36.0 g/dL   RDW 04.5 (H) 40.9 - 81.1 %   Platelets 159 150 - 400 K/uL   nRBC 0.1 0.0 - 0.2 %  Comprehensive metabolic panel     Status: Abnormal   Collection Time: 09/26/20  5:02 AM  Result Value Ref Range   Sodium 127 (L) 135 - 145 mmol/L   Potassium 4.5 3.5 - 5.1 mmol/L   Chloride 98 98 - 111 mmol/L   CO2 23 22 - 32 mmol/L   Glucose, Bld 91 70 - 99 mg/dL   BUN 10 6 - 20 mg/dL   Creatinine, Ser 11/26/20 (L) 0.61 - 1.24 mg/dL   Calcium 7.3 (L) 8.9 - 10.3 mg/dL   Total Protein 5.0 (L) 6.5 - 8.1 g/dL   Albumin 1.8 (L) 3.5 - 5.0 g/dL   AST 28 15 - 41 U/L   ALT 8 0 - 44 U/L   Alkaline Phosphatase 195 (H) 38 - 126 U/L   Total Bilirubin 1.0 0.3  - 1.2 mg/dL   GFR, Estimated 7.82 >95 mL/min   Anion gap 6 5 - 15    No results found.  Assessment/Plan: Left thigh septic arthritis/ multiple left thigh abscess - 2/23 - placement of drain into left retroperitoneal abscess and two into left thigh by IR - 2/24 - left hip total arthroplasty with cement interposition spacer  - 3/1 - left thigh irrigation and debridement with wound vac placement - 3/3 - I&D of left groin abscess with change of wound vac by general surgery - 3/4 - drain placement in left mid thigh by IR - patient febrile and tachycardic this morning - will plan for repeat I&D of left thigh wound with wound vac placement - will assess groin abscess drained by general surgery for possible wound vac placement when in OR will talk to general surgery about their plans for this - NPO after midnight   Contact information:   Weekdays  [address], PA-C [phone] A fter hours and holidays please check [email or web address] for group call information for [facility]  Armida Sans 09/26/2020, 8:37 AM

## 2020-09-26 NOTE — Progress Notes (Signed)
PROGRESS NOTE    EMMA SCHUPP  ALP:[id number] DOB: [birth year] DOA: 09/13/2020 PCP: [name], PA   Brief Narrative:   Patient is a 35 yo male with past medical historysignificant for AVN of the hip, HTN, and GERD. Patient was recently admitted on 08/22/20 for COVID-19 and was discharge on 08/29/20. About two days post discharge, patient began to feel extremely weak and tired with symptoms progressively worsening over the next two weeks. During this time. He also developed left leg thigh and legs pain along with swelling and tenderness to touch. He also developed fluid filled blisters and skin sloughing on the medial portion of his left thigh extending into his left groin. Symptoms escalated in intensity until his was unable to walk without significant pain. Patient saw his PCP on 09/12/20 for a lab draw and was called by his PCP office to go to the ED immediately.  ED Course: Hgb 6.2 with 2U PRBC's given, FOBT negative. CT abdomen showedleft iliopsoas and left gluteal abscesses w/ diffuse swelling and left hip effusion. CCS was consulted. Patient was started on cefazolin and vanc.  Interim History: Blood cultures positive for MSSA bacteremia with ID consulted, repeat blood cultures, and transesophageal echo obtained. He underwent 3 drain placement by IRfor abscessesinhis iliopsoas muscle and multiple abscesses in the legs.  On 09/15/20, he had a lefthip Interpositionarthoplastywithantibiotic spacer placement and debridementand developeddecompensated septic shockwith refractoryhypotension despite 2 unit PRBCs, 2.2 L of crystalloid and 500 mL of albumin.He was admitted to the ICU with vasopressor support. He is now hemodynamically stableon TRH service.  Patient hadleft groin I/Don 3/3/22and an additional aspiration and drainage on3/4/22.  Patient developed tachycardia with hypotension late afternoon on 3/6, was started on 125 mL/hr NaCl IVF's, which is now stopped.   Patient received 1 U of PRBC's on 09/26/20.   Assessment & Plan:   Principal Problem:   MSSA bacteremia Active Problems:   Alcohol dependence ([place])   Alcoholic hepatitis   Sepsis ([place])   Septic arthritis ([place])   Iliopsoas abscess on left [facility])   #MSSA bacteremia/Left hip septic arthritis with iliopsoas, left gluteal, and left medial thigh abscess/Left hip effusion and septic arthritis in the setting of avascular necrosis -Initially started onIV vanc,cefepime,but changed to IV Cefazolin by ID -Initial Blood Cx 09/13/20 showed Staph Aureus inboth sets of Cx's, transthoracic echo negative for vegetation, normal valvular and systolic function -Sepsis Criteria met with: tachycardia, tachypnea, lactic acid 3.3, and Leukocytosis was 10.6 source: abscesses as above as well as bacteremia and went into septic shock this admission. -Repeat blood cultures negative, continue course of cefazolin per ID. -2/28/22TEEshowednormalvalves and no evidence of vegetation. - Wound cultures positive for S. Aureus, bacteroides, Prevotella - WBC's15.1->14.5->10.7 (09/20/20)->11.9->14.7->18.5->15.1->14.8, initially trending down, increased with recent aspiration/drain procedures, now decreasing, continue to trend. - Continues to have pain but less severe, on day37mtrinidazole, day 14cefazolin - ID considering possible PICC placement given prolonged 6 week course of IV cefazolin -2/22 CT Abdomen shows interval development of innumerable fluid collections are seen within the musculature of the left ileopsoas muscle as well as the musculature of the visualized portion of the left gluteal region and proximal left thigh consistent with innumerable abscesses -2/24left hip total arthroplasty with cement interposition spacer  -3/1left thigh irrigation and debridement with wound vac placement-post op day5 -3/3left groin I/D  -3/4 image guided aspiration/drainage of remaining left thigh  collection. -Wound care orders, per surgery. -Patient febrile on 3/6 and tachycardic this morning 3/7 -3/7 Wound vac with 500  cc's bloody drainage in canister, good seal. -There is an additional area of skin breakdown that needs further debridement in the OR. -Incision and debridement of left groin wound planned with Dr. Hassell Done for 3/8, surgery to assess for possible wound vac placement  -NPO after midnight -3/7 start Rifampin 300 mg IVPB q12 hrs per ID  Anasarca in setting of IVF resuscitation, MSSA bacteremia, sepsis, and hepatic cirrhosis -Scrotal edema, and left groin and leg swelling persists -Scrotal U/SMarked asymmetric left scrotal wall thickening and fluid, with increased blood flow.Which is consistent with soft tissue infection. -IVF stopped with IV antibiotics as above  Scrotal edema, likely secondary to soft tissue infection and IVF resuscitation -U/S negative for abscess, IVF stopped -Scrotal sling ordered, symptoms improving  Symptommatic anemia/macrocytic anemia -On admission H/H 6.2/18.9 s/p6U PRBC's total this admission, currently7.1/22.0,asymptomatic,FOBC negative -Anemia panel: iron level of 44, UIBC 131, TIBC 175, saturation ratios of 25% -Approximately200 ml sanguinous drainage from wound vacO/N -Transfused 1Uon3/2/22for Hgb < 7, trend H/Hpost transfusion -Hgb today 7.1, 1 U PRBC's transfused 7/0/26  Sepsis/Metabolic Acidosis/Electrolyte imbalances (Hyponatremia and Hypophosphatemia) -Sepsis and metabolic acidosis resolved, anion gap closed, d/c IVF due to anasarca -Patient's phosphorus level is2.1, following repletion on 3/1 withIV Sodium Phos 20 mmol -Sodium is now127, repleted withSodium Phos IV on 3/6  Hx ofAlcoholism/Alcoholic Hepatitis/HepaticCirrhosis,Hyperbilirubinemia/Mildly abnormal AST -2/22 CT Abdomen consistent with hepatic cirrhosis -Initially placed on prednisolone,40m through 09/26/20, followed by a taper. Per GI  givendisseminated MSSA bacteremia taper discontinued -Per patient drinks 12-18 beers weekly -Liver panel: total Bili 1.5improved from peak of 3.2, albumin 1.7, alk phos 214, AST 46->30,ammonia 45 -Continue lactulose 10 g p.o. twice daily, thiamine, magnesium -Patient and family report he has been alcohol free for 1 month, counsel oncontinuedcessation  Difficulty voiding rt fluid volume overload -Patient current has Foley and was diuresed with 40 mg IV Lasix over the past several days -Foley d/c, has voided  Tachycardia, hypertension, currently sinus tach rate 110-120 -Continue Carvedilol 3.125 mg p.o. twice daily -Patient still is tachycardic, HR max 125 on 3/6 post I/D -Started on NaCl 125 ml/hr with soft B/P of 104/64 on 3/6 -IVF now d/c  Pain control -Oxycodoneprn, discontinued, now on Dilaudid 1 mg IV q4h  Hypomagnesia -Repleted, currently 1.7 -Continue to trend.  Protein Malnutrition, mild -Per dietitian, patient's girlfriend to bring in FFlowoodshakes which he was consuming PTA  Thrombocytopenia, in setting of cirrhosis -No s/s of bleeding Plts 159,000 down from 170,000  GAD -Continue sertraline 25 mg p.o. nightly  GERD -C/w Pantoprazole 40 mg p.o. twice daily  DVT prophylaxis:SCD's Code Status:Full Family Communication:None at bedside  Status is: Inpatient  Dispo: The patient is from:Home Anticipated d/c is tVZ:CHYIPatient currently is not medically stable for discharge. Difficult to place patient: No.   Body mass index is 29.7 kg/m.  Consultants:  General Surgery  Orthopedic Surgery  Infectious Diseases  Interventional Radiology  Urology  Procedures: -2/24left hip total arthroplasty with cement interposition spacer  -3/1left thigh irrigation and debridement with wound vac placement,post op day 1 -3/3left groin I/D -3/4 image guided aspiration/drainage of  remaining left thigh collection. -3/8 Incision and debridement of left groin wound planned with Dr. MHassell Done surgery to assess for possible wound vac placement    Antimicrobials:  Ancef IV  Metronidazole po  Rimampin  Subjective:  Patient alert and awakes.p left hip/thighaspiration/drainage2 days ago. Reports that pain is under control with scrotal swelling improved.  Examination:  General exam:Calm and cooperative, NAD, but has pain this  morning due to movement in bed. Respiratory system: Clear to auscultation. Respiratory effort normal. Cardiovascular system:S1 &S2 heard, RRR. No JVD, murmurs, rubs, gallops or clicks. No pedal edema. Gastrointestinal system:Abdomen is nondistended, soft and nontender. No organomegaly or masses felt. Normal bowel sounds heard. Abdomen:Has lower extremity drains from his left hip with 3drains and one wound vac. Dressing sitesfor 2 lowerdrains clean and dry,groin dressing gauze with sanguinous drainage. Wound vac drainageovernight ~500 ml. Extremities:Left lower leghas swelling and blistering of medial aspect of left thigh into left groin and scrotum.  Less swelling and erythema today. GU: Scrotal edemawithout erythema,left groin blistering with adjacent area of left thigh also affected.Perineal area and right buttock also has erythema. Central nervous system:Alert and oriented. No focal neurological deficits. Extremities: Symmetric 5 x 5 power. Skin: left groin blistering with adjacent area of left thigh also affected.Perineal area and right buttock also has erythema. Psychiatry:Judgement and insight appear normal. Mood &affect appropriate.   Objective: Vitals:   09/25/20 2044 09/26/20 0030 09/26/20 0436 09/26/20 0845  BP: 114/71 113/68 107/70 113/67  Pulse: (!) 115 (!) 109 (!) 108 (!) 120  Resp: 19 18 18 18   Temp: 98.7 F (37.1 C) 98.1 F (36.7 C) 98 F (36.7 C) 98.2 F (36.8 C)  TempSrc: Oral Oral Oral Oral   SpO2: 93% 97%  96%  Weight:      Height:        Intake/Output Summary (Last 24 hours) at 09/26/2020 1146 Last data filed at 09/26/2020 1144 Gross per 24 hour  Intake 3786.73 ml  Output 1602 ml  Net 2184.73 ml   Filed Weights   09/19/20 0500 09/19/20 0843 09/21/20 0500  Weight: 91.8 kg 97.8 kg 93.9 kg     Data Reviewed:   CBC: Recent Labs  Lab 09/20/20 0244 09/21/20 0234 09/21/20 1117 09/23/20 0939 09/24/20 0912 09/25/20 0247 09/26/20 0502  WBC 10.7* 11.9*  --  14.7* 18.5* 15.1* 14.8*  NEUTROABS 8.3* 9.8*  --   --   --   --   --   HGB 7.7* 6.4* 7.8* 7.4* 7.2* 7.5* 7.1*  HCT 24.0* 20.3* 23.5* 22.7* 21.9* 23.3* 22.0*  MCV 100.0 102.0*  --  100.4* 101.9* 102.2* 102.8*  PLT 99* 104*  --  141* 145* 155 142   Basic Metabolic Panel: Recent Labs  Lab 09/20/20 0244 09/21/20 0234 09/23/20 0243 09/24/20 0912 09/25/20 0247 09/26/20 0502  NA 131* 126* 127* 125* 127* 127*  K 4.3 4.8 5.0 4.7 5.3* 4.5  CL 95* 95* 96* 94* 96* 98  CO2 23 23 23 24 25 23   GLUCOSE 96 173* 142* 112* 92 91  BUN 13 10 11 12 11 10   CREATININE 0.43* 0.34* 0.44* 0.41* 0.44* 0.36*  CALCIUM 7.4* 7.1* 7.5* 7.4* 7.6* 7.3*  MG 1.9 1.7  --   --   --   --   PHOS 2.7 3.1  --   --   --   --    GFR: Estimated Creatinine Clearance: 147 mL/min (A) (by C-G formula based on SCr of 0.36 mg/dL (L)). Liver Function Tests: Recent Labs  Lab 09/21/20 0234 09/23/20 0243 09/24/20 0912 09/25/20 0247 09/26/20 0502  AST 30 31 32 31 28  ALT 11 9 9 8 8   ALKPHOS 214* 211* 203* 198* 195*  BILITOT 1.5* 1.0 0.9 1.1 1.0  PROT 4.8* 5.2* 5.0* 5.2* 5.0*  ALBUMIN 1.7* 1.9* 1.9* 1.8* 1.8*   No results for input(s): LIPASE, AMYLASE in the last 168 hours. No  results for input(s): AMMONIA in the last 168 hours. Coagulation Profile: No results for input(s): INR, PROTIME in the last 168 hours. Cardiac Enzymes: No results for input(s): CKTOTAL, CKMB, CKMBINDEX, TROPONINI in the last 168 hours. BNP (last 3 results) No results  for input(s): PROBNP in the last 8760 hours. HbA1C: No results for input(s): HGBA1C in the last 72 hours. CBG: No results for input(s): GLUCAP in the last 168 hours. Lipid Profile: No results for input(s): CHOL, HDL, LDLCALC, TRIG, CHOLHDL, LDLDIRECT in the last 72 hours. Thyroid Function Tests: No results for input(s): TSH, T4TOTAL, FREET4, T3FREE, THYROIDAB in the last 72 hours. Anemia Panel: No results for input(s): VITAMINB12, FOLATE, FERRITIN, TIBC, IRON, RETICCTPCT in the last 72 hours. Sepsis Labs: Recent Labs  Lab 09/20/20 0244  PROCALCITON 2.02    No results found for this or any previous visit (from the past 240 hour(s)).       Radiology Studies: No results found.      Scheduled Meds: . (feeding supplement) PROSource Plus  30 mL Oral BID BM  . sodium chloride   Intravenous Once  . sodium chloride   Intravenous Once  . carvedilol  6.25 mg Oral BID WC  . Chlorhexidine Gluconate Cloth  6 each Topical Daily  . docusate sodium  100 mg Oral BID  . feeding supplement  1 Container Oral BID BM  . folic acid  1 mg Oral Daily  . lactulose  10 g Oral BID  . mouth rinse  15 mL Mouth Rinse BID  . metroNIDAZOLE  500 mg Oral Q8H  . multivitamin with minerals  1 tablet Oral Daily  . nutrition supplement (JUVEN)  1 packet Oral BID BM  . pantoprazole  40 mg Oral BID  . sertraline  25 mg Oral Daily  . sodium chloride flush  5 mL Intracatheter Q8H  . sodium chloride flush  5 mL Intracatheter Q8H  . thiamine  100 mg Oral Daily   Continuous Infusions: .  ceFAZolin (ANCEF) IV 2 g (09/26/20 0512)     LOS: 13 days   Time spent= 35 mins   Dede Query, RN, NP-Student   If 7PM-7AM, please contact night-coverage  09/26/2020, 11:46 AM

## 2020-09-26 NOTE — Progress Notes (Signed)
[facility]  PROGRESS NOTE  [name] WUG:[id number] DOB: Apr 08, 1986 DOA: 09/13/2020 PCP: [name], PA   Brief HPI:   35 year old male with past medical history of AVN of the hip, hypertension, GERD he was recently admitted on 08/22/2020 for COVID-19 and was discharged on 08/29/2020.  2 days after discharge patient began to feel extremely weak and tired with symptoms progressing over next 2 weeks.  Patient developed left thigh rash and leg pain with swelling and tenderness to touch.  Patient was seen at PCP office and was told to go to the ED immediately.  He was found to have left iliopsoas and left gluteal abscess with diffuse swelling and left hip effusion on CT scan.  General surgery was consulted.  Blood cultures positive for MSSA bacteremia.  He underwent drainage of the abscess with 3 drain placement by IR in his iliopsoas muscle and multiple abscesses in the legs.  On 09/15/2020 had left hip interposition arthroplasty with antibiotic spacer placement and debridement and developed decompensated septic shock with refractory hypotension despite 2 units PRBC, 2.2 L of crystalloid and 500 cc of albumin.  He was admitted to ICU with vasopressor support.  ID has been consulted and recommend 6 weeks of IV cefazolin. Patient also had scrotal edema anasarca in the setting of IV fluid resuscitation.  Ultrasound was unremarkable for abscess but showed marked asymmetric left scrotal wall thickening and fluid with increased blood flow consistent with soft tissue infection.      Subjective   Patient seen and examined, continues to have pain.  Plan for further debridement and change of wound VAC per surgery and orthopedics tomorrow.   Assessment/Plan:     1. Left hip septic arthritis/left thigh fluid collection-patient has history of AVN, developed left thigh abscess.  He underwent drainage of the abscess with drain placement x3, left retroperitoneum, medial left thigh, lateral left thigh  and IR on 09/14/2020.  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  Patient underwent left thigh irrigation and debridement with wound VAC placement on 09/20/2020. General surgery took patient to the OR for left thigh irrigation and debridement with change of wound VAC.  Repeat CT showed persistent left anterior thigh fluid collection concerning for abscess, IR has been reconsulted for image guided aspiration/drainage of the remaining left thigh collection.  Both IR and general surgery are following.  WBC is up to 18.5 today.  Continue cefazolin, Flagyl.  Follow CBC in a.m. 2. Chronic pain-secondary to above, will continue oxycodone and Dilaudid 1 mg IV every 4 hours as needed. 3. Septic shock/MSSA bacteremia-patient presented with septic shock, resolved.  Also developed MSSA bacteremia, likely from complicated iliopsoas/gluteal/thigh abscess.  Infectious disease was consulted.  Patient started on IV cefazolin.  Patient initially underwent TTE which did not show any vegetation.  As it was difficult study so he underwent transesophageal echocardiogram on 09/19/2020, which showed no vegetation.  ID is following, will need prolonged course of antibiotics.  Duration of antibiotics per ID. 4. Symptomatic anemia-patient presented with hemoglobin/hematocrit of 6.2/18.9, he received 4 units PRBC.  Anemia panel showed iron level 44, U IBC 131, TIBC 175, saturation is 25%.  LDH was 308, haptoglobin pending, his hemoglobin was 6.4 yesterday, received another unit of PRBC, hemoglobin is 7.1 today.  Will transfuse 1 unit PRBC.  Follow CBC in a.m.  5. History of alcoholic hepatitis/liver cirrhosis-patient has history of alcohol abuse, he was started on prednisolone for alcoholic hepatitis and was being tapered off.   Prednisone was discontinued due to bacteremia.  LFTs are back to normal. 6. Hyponatremia-likely chronic from underlying liver cirrhosis, likely chronic.  We will continue to monitor sodium. 7. Scrotal edema-patient developed scrotal edema, scrotal ultrasound showed no evidence of testicular mass or torsion, mild asymmetric Scrotal wall thickening and fluid with increased blood flow.  Likely from soft tissue infection.  Will order scrotal support. 8. Thrombocytopenia-platelet count improved.     COVID-19 Labs  No results for input(s): DDIMER, FERRITIN, LDH, CRP in the last 72 hours.  Lab Results  Component Value Date   SARSCOV2NAA NEGATIVE 09/13/2020   SARSCOV2NAA POSITIVE (A) 08/22/2020   SARSCOV2NAA DETECTED (A) 02/24/2019     Scheduled medications:   . (feeding supplement) PROSource Plus  30 mL Oral BID BM  . sodium chloride   Intravenous Once  . sodium chloride   Intravenous Once  . carvedilol  6.25 mg Oral BID WC  . Chlorhexidine Gluconate Cloth  6 each Topical Daily  . docusate sodium  100 mg Oral BID  . feeding supplement  1 Container Oral BID BM  . folic acid  1 mg Oral Daily  . lactulose  10 g Oral BID  . mouth rinse  15 mL Mouth Rinse BID  . metroNIDAZOLE  500 mg Oral Q8H  . multivitamin with minerals  1 tablet Oral Daily  . nutrition supplement (JUVEN)  1 packet Oral BID BM  . pantoprazole  40 mg Oral BID  . sertraline  25 mg Oral Daily  . sodium chloride flush  5 mL Intracatheter Q8H  . sodium chloride flush  5 mL Intracatheter Q8H  . thiamine  100 mg Oral Daily      SpO2: 96 % O2 Flow Rate (L/min): 2 L/min    CBC: Recent Labs  Lab 09/20/20 0244 09/21/20 0234 09/21/20 1117 09/23/20 0939 09/24/20 0912 09/25/20 0247 09/26/20 0502  WBC 10.7* 11.9*  --  14.7* 18.5* 15.1* 14.8*  NEUTROABS 8.3* 9.8*  --   --   --   --   --   HGB 7.7* 6.4* 7.8* 7.4* 7.2* 7.5* 7.1*  HCT 24.0* 20.3* 23.5* 22.7* 21.9* 23.3* 22.0*  MCV 100.0 102.0*  --  100.4* 101.9*  102.2* 102.8*  PLT 99* 104*  --  141* 145* 155 159    Basic Metabolic Panel: Recent Labs  Lab 09/20/20 0244 09/21/20 0234 09/23/20 0243 09/24/20 0912 09/25/20 0247 09/26/20 0502  NA 131* 126* 127* 125* 127* 127*  K 4.3 4.8 5.0 4.7 5.3* 4.5  CL 95* 95* 96* 94* 96* 98  CO2 23 23 23 24 25 23   GLUCOSE 96 173* 142* 112* 92 91  BUN 13 10 11 12 11 10   CREATININE 0.43* 0.34* 0.44* 0.41* 0.44* 0.36*  CALCIUM 7.4* 7.1* 7.5* 7.4* 7.6* 7.3*  MG 1.9 1.7  --   --   --   --   PHOS 2.7 3.1  --   --   --   --      Liver Function Tests: Recent Labs  Lab 09/21/20 0234 09/23/20 0243 09/24/20 0912 09/25/20 0247 09/26/20 0502  AST 30 31 32 31 28  ALT 11 9 9 8 8   ALKPHOS 214* 211* 203* 198* 195*  BILITOT 1.5* 1.0 0.9 1.1 1.0  PROT 4.8* 5.2* 5.0* 5.2* 5.0*  ALBUMIN 1.7* 1.9* 1.9* 1.8*  1.8*     Antibiotics: Anti-infectives (From admission, onward)   Start     Dose/Rate Route Frequency Ordered Stop   09/26/20 1300  rifampin (RIFADIN) 300 mg in sodium chloride 0.9 % 100 mL IVPB        300 mg 200 mL/hr over 30 Minutes Intravenous Every 12 hours 09/26/20 1156     09/18/20 1400  metroNIDAZOLE (FLAGYL) tablet 500 mg        500 mg Oral Every 8 hours 09/18/20 0927     09/15/20 1715  tobramycin (NEBCIN) powder  Status:  Discontinued          As needed 09/15/20 1715 09/15/20 2043   09/15/20 1715  vancomycin (VANCOCIN) powder  Status:  Discontinued          As needed 09/15/20 1716 09/15/20 2043   09/14/20 0600  ceFAZolin (ANCEF) IVPB 2g/100 mL premix        2 g 200 mL/hr over 30 Minutes Intravenous Every 8 hours 09/14/20 0359     09/13/20 2200  vancomycin (VANCOREADY) IVPB 1500 mg/300 mL  Status:  Discontinued        1,500 mg 150 mL/hr over 120 Minutes Intravenous Every 12 hours 09/13/20 1636 09/14/20 0359   09/13/20 1500  ceFEPIme (MAXIPIME) 2 g in sodium chloride 0.9 % 100 mL IVPB  Status:  Discontinued        2 g 200 mL/hr over 30 Minutes Intravenous Every 8 hours 09/13/20 1437 09/14/20  0359   09/13/20 1130  vancomycin (VANCOREADY) IVPB 1750 mg/350 mL        1,750 mg 175 mL/hr over 120 Minutes Intravenous  Once 09/13/20 1107 09/13/20 1414   09/13/20 1045  ceFAZolin (ANCEF) IVPB 1 g/50 mL premix        1 g 100 mL/hr over 30 Minutes Intravenous  Once 09/13/20 1031 09/13/20 1307       DVT prophylaxis: SCDs  Code Status: Full code  Family Communication: Discussed with patient girlfriend at bedside   Consultants:  General surgery  Orthopedics  Infectious disease  Interventional radiology  Procedures:  TOTAL HIP ARTHROPLASTY WITH CEMENT INTERPOSITION SPACING for septic hip arthritis from septicemia in the setting of alcoholic AVN of the left hip  Drainage of the abscess with 3 drain placement by IR in iliopsoas muscle and multiple abscesses in the legs     Objective   Vitals:   09/25/20 2044 09/26/20 0030 09/26/20 0436 09/26/20 0845  BP: 114/71 113/68 107/70 113/67  Pulse: (!) 115 (!) 109 (!) 108 (!) 120  Resp: 19 18 18 18   Temp: 98.7 F (37.1 C) 98.1 F (36.7 C) 98 F (36.7 C) 98.2 F (36.8 C)  TempSrc: Oral Oral Oral Oral  SpO2: 93% 97%  96%  Weight:      Height:        Intake/Output Summary (Last 24 hours) at 09/26/2020 1312 Last data filed at 09/26/2020 1144 Gross per 24 hour  Intake 3786.73 ml  Output 1602 ml  Net 2184.73 ml    03/05 1901 - 03/07 0700 In: 3984.1 [P.O.:2160; I.V.:1326.9] Out: 2151 [Urine:1650; Drains:500]  Filed Weights   09/19/20 0500 09/19/20 0843 09/21/20 0500  Weight: 91.8 kg 97.8 kg 93.9 kg    Physical Examination:   General-appears in no acute distress Heart-S1-S2, regular, no murmur auscultated Lungs-clear to auscultation bilaterally, no wheezing or crackles auscultated Abdomen-soft, nontender, no organomegaly Extremities-left thigh has wound VAC in place, continues to have mild edema Neuro-alert, oriented x3, no  focal deficit noted  Status is: Inpatient  Dispo: The patient is from: Home               Anticipated d/c is to: Skilled nursing facility              Anticipated d/c date is: 09/29/2020              Patient currently not stable for discharge  Barrier to discharge-ongoing management for iliopsoas abscess       Joshuah Minella [address] [place]   [facility] If 7PM-7AM, please contact night-coverage at [email or web address], Office  [phone]   09/26/2020, 1:12 PM  LOS: 13 days

## 2020-09-26 NOTE — Progress Notes (Signed)
    [facility]   Reason for visit: Follow up on disseminated MSSA infection  Interval History: no acute events.  WBC 14.8.  Tmax 101.5.  Plan for surgery tomorrow for more debridement.  Day 14cefazolin Day 9 metronidazole   Physical Exam: Constitutional:  Vitals:   09/26/20 0436 09/26/20 0845  BP: 107/70 113/67  Pulse: (!) 108 (!) 120  Resp: 18 18  Temp: 98 F (36.7 C) 98.2 F (36.8 C)  SpO2:  96%  Constitutional: alert, nad HENT: no thrush CV: RRR Pulmonary: CTA B  Review of Systems: Constitutional: negative for fever or chills Integument/breast: no rash  Lab Results  Component Value Date   WBC 14.8 (H) 09/26/2020   HGB 7.1 (L) 09/26/2020   HCT 22.0 (L) 09/26/2020   MCV 102.8 (H) 09/26/2020   PLT 159 09/26/2020    Lab Results  Component Value Date   CREATININE 0.36 (L) 09/26/2020   BUN 10 09/26/2020   NA 127 (L) 09/26/2020   K 4.5 09/26/2020   CL 98 09/26/2020   CO2 23 09/26/2020    Lab Results  Component Value Date   ALT 8 09/26/2020   AST 28 09/26/2020   ALKPHOS 195 (H) 09/26/2020     Microbiology: No results found for this or any previous visit (from the past 240 hour(s)).  Impression/Plan:  1.  Disseminated MSSA bacteremia - he continues on cefazolin and with his prosthetic joint I will go ahead and add rifampin now.  I will use IV therapy due to his ongoing surgical needs.   Patient counseled on rifampin use.   2. Hip and thigh infection - continued area of infection of thigh wound and plan for further debridement tomorrow and VAC change.    3.  Fever - he did have fever yesterday which is not unexpected with ongoing infection.  Will continue to monitor.

## 2020-09-27 ENCOUNTER — Inpatient Hospital Stay (HOSPITAL_COMMUNITY): Payer: 59 | Admitting: Certified Registered"

## 2020-09-27 ENCOUNTER — Encounter (HOSPITAL_COMMUNITY)
Admission: EM | Disposition: A | Discharge status: Rehabilitation Facility | Payer: Self-pay | Source: Home / Self Care | Attending: Internal Medicine

## 2020-09-27 ENCOUNTER — Encounter (HOSPITAL_COMMUNITY): Payer: Self-pay | Admitting: Pulmonary Disease

## 2020-09-27 ENCOUNTER — Inpatient Hospital Stay (HOSPITAL_COMMUNITY): Payer: 59

## 2020-09-27 DIAGNOSIS — K7010 Alcoholic hepatitis without ascites: Secondary | ICD-10-CM | POA: Diagnosis not present

## 2020-09-27 DIAGNOSIS — L02416 Cutaneous abscess of left lower limb: Secondary | ICD-10-CM | POA: Diagnosis not present

## 2020-09-27 DIAGNOSIS — K7030 Alcoholic cirrhosis of liver without ascites: Secondary | ICD-10-CM | POA: Diagnosis not present

## 2020-09-27 DIAGNOSIS — F1029 Alcohol dependence with unspecified alcohol-induced disorder: Secondary | ICD-10-CM | POA: Diagnosis not present

## 2020-09-27 HISTORY — PX: I & D EXTREMITY: SHX5045

## 2020-09-27 HISTORY — PX: INCISION AND DRAINAGE OF WOUND: SHX1803

## 2020-09-27 LAB — CBC
HCT: 25.1 % — ABNORMAL LOW (ref 39.0–52.0)
Hemoglobin: 8.1 g/dL — ABNORMAL LOW (ref 13.0–17.0)
MCH: 32.4 pg (ref 26.0–34.0)
MCHC: 32.3 g/dL (ref 30.0–36.0)
MCV: 100.4 fL — ABNORMAL HIGH (ref 80.0–100.0)
Platelets: 169 10*3/uL (ref 150–400)
RBC: 2.5 MIL/uL — ABNORMAL LOW (ref 4.22–5.81)
RDW: 18.5 % — ABNORMAL HIGH (ref 11.5–15.5)
WBC: 13.6 10*3/uL — ABNORMAL HIGH (ref 4.0–10.5)
nRBC: 0.1 % (ref 0.0–0.2)

## 2020-09-27 LAB — BASIC METABOLIC PANEL
Anion gap: 4 — ABNORMAL LOW (ref 5–15)
BUN: 12 mg/dL (ref 6–20)
CO2: 25 mmol/L (ref 22–32)
Calcium: 7.4 mg/dL — ABNORMAL LOW (ref 8.9–10.3)
Chloride: 100 mmol/L (ref 98–111)
Creatinine, Ser: 0.49 mg/dL — ABNORMAL LOW (ref 0.61–1.24)
GFR, Estimated: >60 mL/min (ref >60)
Glucose, Bld: 89 mg/dL (ref 70–99)
Potassium: 4.2 mmol/L (ref 3.5–5.1)
Sodium: 129 mmol/L — ABNORMAL LOW (ref 135–145)

## 2020-09-27 LAB — BPAM RBC
Blood Product Expiration Date: 202203312359
ISSUE DATE / TIME: 202203071802
Unit Type and Rh: 9500

## 2020-09-27 LAB — TYPE AND SCREEN
ABO/RH(D): O NEG
Antibody Screen: NEGATIVE
Unit division: 0

## 2020-09-27 SURGERY — IRRIGATION AND DEBRIDEMENT EXTREMITY
Anesthesia: General | Site: Thigh | Laterality: Left

## 2020-09-27 MED ORDER — ONDANSETRON HCL 4 MG/2ML IJ SOLN: 4.0000 mg | Freq: Once | INTRAMUSCULAR | Status: DC | PRN

## 2020-09-27 MED ORDER — MIDAZOLAM HCL 5 MG/5ML IJ SOLN
INTRAMUSCULAR | Status: DC | PRN
  Administered 2020-09-27: 2 mg via INTRAVENOUS

## 2020-09-27 MED ORDER — KETAMINE HCL 10 MG/ML IJ SOLN
INTRAMUSCULAR | Status: AC
  Filled 2020-09-27: qty 1

## 2020-09-27 MED ORDER — FENTANYL CITRATE (PF) 100 MCG/2ML IJ SOLN
INTRAMUSCULAR | Status: AC
  Filled 2020-09-27: qty 2

## 2020-09-27 MED ORDER — PROPOFOL 10 MG/ML IV BOLUS
INTRAVENOUS | Status: AC
  Filled 2020-09-27: qty 20

## 2020-09-27 MED ORDER — LACTATED RINGERS IV SOLN: INTRAVENOUS | Status: DC

## 2020-09-27 MED ORDER — MIDAZOLAM HCL 2 MG/2ML IJ SOLN
0.5000 mg | Freq: Once | INTRAMUSCULAR | Status: AC | PRN
  Administered 2020-09-27: 1 mg via INTRAVENOUS

## 2020-09-27 MED ORDER — POVIDONE-IODINE 10 % EX SWAB
2.0000 "application " | Freq: Once | CUTANEOUS | Status: AC
  Administered 2020-09-27: 2 via TOPICAL

## 2020-09-27 MED ORDER — HYDROMORPHONE HCL 1 MG/ML IJ SOLN
INTRAMUSCULAR | Status: AC
  Filled 2020-09-27: qty 2

## 2020-09-27 MED ORDER — MEPERIDINE HCL 50 MG/ML IJ SOLN
6.2500 mg | INTRAMUSCULAR | Status: DC | PRN
Start: 2020-09-27 — End: 2020-09-27

## 2020-09-27 MED ORDER — OXYCODONE HCL 5 MG PO TABS: 5.0000 mg | ORAL_TABLET | Freq: Once | ORAL | Status: DC | PRN

## 2020-09-27 MED ORDER — MIDAZOLAM HCL 2 MG/2ML IJ SOLN
INTRAMUSCULAR | Status: AC
  Filled 2020-09-27: qty 2

## 2020-09-27 MED ORDER — PROMETHAZINE HCL 25 MG/ML IJ SOLN: 6.2500 mg | INTRAMUSCULAR | Status: DC | PRN

## 2020-09-27 MED ORDER — SODIUM CHLORIDE 0.9 % IR SOLN
Status: DC | PRN
  Administered 2020-09-27: 3000 mL

## 2020-09-27 MED ORDER — KETAMINE HCL 10 MG/ML IJ SOLN
INTRAMUSCULAR | Status: DC | PRN
  Administered 2020-09-27: 20 mg via INTRAVENOUS
  Administered 2020-09-27: 30 mg via INTRAVENOUS
  Administered 2020-09-27: 20 mg via INTRAVENOUS

## 2020-09-27 MED ORDER — PROPOFOL 10 MG/ML IV BOLUS
INTRAVENOUS | Status: DC | PRN
  Administered 2020-09-27: 200 mg via INTRAVENOUS

## 2020-09-27 MED ORDER — OXYCODONE HCL 5 MG/5ML PO SOLN: 5.0000 mg | Freq: Once | ORAL | Status: DC | PRN

## 2020-09-27 MED ORDER — SODIUM CHLORIDE 0.9 % IR SOLN
Status: DC | PRN
  Administered 2020-09-27: 1000 mL

## 2020-09-27 MED ORDER — METOCLOPRAMIDE HCL 5 MG/ML IJ SOLN: 5.0000 mg | Freq: Three times a day (TID) | INTRAMUSCULAR | Status: DC | PRN

## 2020-09-27 MED ORDER — HYDROMORPHONE HCL 1 MG/ML IJ SOLN
0.2500 mg | INTRAMUSCULAR | Status: DC | PRN
  Administered 2020-09-27 (×4): 0.5 mg via INTRAVENOUS

## 2020-09-27 MED ORDER — DEXAMETHASONE SODIUM PHOSPHATE 10 MG/ML IJ SOLN
INTRAMUSCULAR | Status: DC | PRN
  Administered 2020-09-27: 4 mg via INTRAVENOUS

## 2020-09-27 MED ORDER — HYDROMORPHONE HCL 1 MG/ML IJ SOLN: 0.2500 mg | INTRAMUSCULAR | Status: DC | PRN

## 2020-09-27 MED ORDER — MORPHINE SULFATE ER 15 MG PO TBCR
15.0000 mg | EXTENDED_RELEASE_TABLET | Freq: Two times a day (BID) | ORAL | Status: DC
  Administered 2020-09-27 – 2020-10-10 (×26): 15 mg via ORAL
  Filled 2020-09-27 (×26): qty 1

## 2020-09-27 MED ORDER — ROCURONIUM BROMIDE 10 MG/ML (PF) SYRINGE
PREFILLED_SYRINGE | INTRAVENOUS | Status: DC | PRN
  Administered 2020-09-27: 20 mg via INTRAVENOUS
  Administered 2020-09-27: 60 mg via INTRAVENOUS

## 2020-09-27 MED ORDER — ACETAMINOPHEN 500 MG PO TABS
1000.0000 mg | ORAL_TABLET | Freq: Once | ORAL | Status: AC
  Administered 2020-09-27: 1000 mg via ORAL
  Filled 2020-09-27: qty 2

## 2020-09-27 MED ORDER — METOCLOPRAMIDE HCL 5 MG PO TABS: 5.0000 mg | ORAL_TABLET | Freq: Three times a day (TID) | ORAL | Status: DC | PRN

## 2020-09-27 MED ORDER — ONDANSETRON HCL 4 MG/2ML IJ SOLN
INTRAMUSCULAR | Status: AC
  Filled 2020-09-27: qty 2

## 2020-09-27 MED ORDER — SUGAMMADEX SODIUM 200 MG/2ML IV SOLN
INTRAVENOUS | Status: DC | PRN
  Administered 2020-09-27: 200 mg via INTRAVENOUS

## 2020-09-27 MED ORDER — DEXAMETHASONE SODIUM PHOSPHATE 10 MG/ML IJ SOLN
INTRAMUSCULAR | Status: AC
  Filled 2020-09-27: qty 1

## 2020-09-27 MED ORDER — LIDOCAINE 2% (20 MG/ML) 5 ML SYRINGE
INTRAMUSCULAR | Status: DC | PRN
  Administered 2020-09-27: 40 mg via INTRAVENOUS

## 2020-09-27 MED ORDER — LIDOCAINE 2% (20 MG/ML) 5 ML SYRINGE
INTRAMUSCULAR | Status: AC
  Filled 2020-09-27: qty 5

## 2020-09-27 MED ORDER — HYDROMORPHONE HCL 1 MG/ML IJ SOLN
0.2500 mg | INTRAMUSCULAR | Status: DC | PRN
  Administered 2020-09-27: 0.5 mg via INTRAVENOUS

## 2020-09-27 MED ORDER — SENNA 8.6 MG PO TABS
1.0000 | ORAL_TABLET | Freq: Two times a day (BID) | ORAL | Status: DC
  Administered 2020-09-28 – 2020-10-09 (×9): 8.6 mg via ORAL
  Filled 2020-09-27 (×24): qty 1

## 2020-09-27 MED ORDER — OXYCODONE HCL 5 MG PO TABS
5.0000 mg | ORAL_TABLET | Freq: Once | ORAL | Status: DC | PRN
Start: 2020-09-27 — End: 2020-09-27

## 2020-09-27 SURGICAL SUPPLY — 55 items
BLADE HEX COATED 2.75 (ELECTRODE) ×3 IMPLANT
BLADE SURG 15 STRL LF DISP TIS (BLADE) ×2 IMPLANT
BLADE SURG 15 STRL SS (BLADE) ×3
BLADE SURG SZ10 CARB STEEL (BLADE) ×3 IMPLANT
BNDG CMPR MED 10X6 ELC LF (GAUZE/BANDAGES/DRESSINGS) ×2
BNDG COHESIVE 4X5 TAN STRL (GAUZE/BANDAGES/DRESSINGS) ×2 IMPLANT
BNDG ELASTIC 6X10 VLCR STRL LF (GAUZE/BANDAGES/DRESSINGS) ×1 IMPLANT
BNDG GAUZE ELAST 4 BULKY (GAUZE/BANDAGES/DRESSINGS) ×1 IMPLANT
BOOTIES KNEE HIGH SLOAN (MISCELLANEOUS) ×6 IMPLANT
COVER SURGICAL LIGHT HANDLE (MISCELLANEOUS) ×6 IMPLANT
COVER WAND RF STERILE (DRAPES) ×1 IMPLANT
DECANTER SPIKE VIAL GLASS SM (MISCELLANEOUS) ×3 IMPLANT
DRAPE LAPAROTOMY TRNSV 102X78 (DRAPES) ×3 IMPLANT
DRAPE POUCH INSTRU U-SHP 10X18 (DRAPES) ×3 IMPLANT
DRSG PAD ABDOMINAL 8X10 ST (GAUZE/BANDAGES/DRESSINGS) ×6 IMPLANT
DRSG VAC ATS MED SENSATRAC (GAUZE/BANDAGES/DRESSINGS) ×1 IMPLANT
DURAPREP 26ML APPLICATOR (WOUND CARE) ×2 IMPLANT
ELECT REM PT RETURN 15FT ADLT (MISCELLANEOUS) ×6 IMPLANT
GAUZE 4X4 16PLY RFD (DISPOSABLE) ×3 IMPLANT
GAUZE SPONGE 4X4 12PLY STRL (GAUZE/BANDAGES/DRESSINGS) ×5 IMPLANT
GAUZE XEROFORM 1X8 LF (GAUZE/BANDAGES/DRESSINGS) ×2 IMPLANT
GLOVE BIOGEL M 8.0 STRL (GLOVE) ×3 IMPLANT
GLOVE SRG 8 PF TXTR STRL LF DI (GLOVE) ×2 IMPLANT
GLOVE SURG ENC MOIS LTX SZ7 (GLOVE) ×3 IMPLANT
GLOVE SURG SS PI 7.5 STRL IVOR (GLOVE) ×3 IMPLANT
GLOVE SURG UNDER POLY LF SZ7 (GLOVE) ×6 IMPLANT
GLOVE SURG UNDER POLY LF SZ8 (GLOVE) ×3
GOWN SPEC L4 XLG W/TWL (GOWN DISPOSABLE) ×3 IMPLANT
GOWN STRL REUS W/TWL LRG LVL3 (GOWN DISPOSABLE) ×12 IMPLANT
GOWN STRL REUS W/TWL XL LVL3 (GOWN DISPOSABLE) ×9 IMPLANT
IV NS IRRIG 3000ML ARTHROMATIC (IV SOLUTION) ×4 IMPLANT
KIT BASIN OR (CUSTOM PROCEDURE TRAY) ×6 IMPLANT
KIT TURNOVER KIT A (KITS) ×6 IMPLANT
LEGGING LITHOTOMY PAIR STRL (DRAPES) ×1 IMPLANT
MANIFOLD NEPTUNE II (INSTRUMENTS) ×3 IMPLANT
MARKER SKIN DUAL TIP RULER LAB (MISCELLANEOUS) ×3 IMPLANT
NDL HYPO 25X1 1.5 SAFETY (NEEDLE) ×2 IMPLANT
NEEDLE HYPO 22GX1.5 SAFETY (NEEDLE) ×3 IMPLANT
NEEDLE HYPO 25X1 1.5 SAFETY (NEEDLE) ×3 IMPLANT
NS IRRIG 1000ML POUR BTL (IV SOLUTION) ×6 IMPLANT
PACK BASIC VI WITH GOWN DISP (CUSTOM PROCEDURE TRAY) ×3 IMPLANT
PACK ORTHO EXTREMITY (CUSTOM PROCEDURE TRAY) ×3 IMPLANT
PAD CAST 4YDX4 CTTN HI CHSV (CAST SUPPLIES) ×4 IMPLANT
PADDING CAST COTTON 4X4 STRL (CAST SUPPLIES)
PENCIL SMOKE EVACUATOR (MISCELLANEOUS) ×1 IMPLANT
PROTECTOR NERVE ULNAR (MISCELLANEOUS) ×2 IMPLANT
SET CYSTO W/LG BORE CLAMP LF (SET/KITS/TRAYS/PACK) ×1 IMPLANT
SOL PREP POV-IOD 4OZ 10% (MISCELLANEOUS) ×3 IMPLANT
STRIP CLOSURE SKIN 1/2X4 (GAUZE/BANDAGES/DRESSINGS) ×3 IMPLANT
SUT NYLON 3 0 (SUTURE) ×6 IMPLANT
SYR CONTROL 10ML LL (SYRINGE) ×3 IMPLANT
TOWEL OR 17X26 10 PK STRL BLUE (TOWEL DISPOSABLE) ×3 IMPLANT
TRAY FOLEY MTR SLVR 16FR STAT (SET/KITS/TRAYS/PACK) ×1 IMPLANT
TRAY PREP A LATEX SAFE STRL (SET/KITS/TRAYS/PACK) ×1 IMPLANT
WATER STERILE IRR 1000ML POUR (IV SOLUTION) ×3 IMPLANT

## 2020-09-27 NOTE — Op Note (Signed)
09/13/2020 - 09/27/2020  11:35 AM  PATIENT:  [name]    PRE-OPERATIVE DIAGNOSIS:  infected left thigh infected left groin  POST-OPERATIVE DIAGNOSIS:  Same  PROCEDURE: Left thigh irrigation and debridement, excisional debridement, skin, subcutaneous tissue, muscle with partial closure  SURGEON:  Eulas Post, MD  PHYSICIAN ASSISTANT: Janine Ores, PA-C, present and scrubbed throughout the case, critical for completion in a timely fashion, and for retraction, instrumentation, and closure.  ANESTHESIA:   General  PREOPERATIVE INDICATIONS:  [name] is a  35 y.o. male who is elected for repeat irrigation and debridement with partial closure of the left thigh wound.  He also has persistent purulence in his groin, and general surgery is planning to operate in combination with myself today for irrigation and debridement of his inguinal region and perineum.  The risks benefits and alternatives were discussed with the patient preoperatively including but not limited to the risks of infection, bleeding, nerve injury, cardiopulmonary complications, the need for revision surgery, among others, and the patient was willing to proceed.  ESTIMATED BLOOD LOSS: 50 mL  OPERATIVE IMPLANTS: Medium wound VAC  OPERATIVE FINDINGS: The thigh wound appeared relatively clean, distally there was no purulence, proximally the wound appeared to communicate to the perineal purulence.    Debridement type: Excisional Debridement  Side: left  Body Location: Thigh  Tools used for debridement: scissors and I used a gauze to debride the depths of the proximal wound  Pre-debridement Wound size (cm):   Length: 15        width: 7     depth: 5  Post-debridement Wound size (cm):   Length: 7        width: 5     depth: 5  Debridement depth beyond dead/damaged tissue down to healthy viable tissue: yes  Tissue layer involved: skin, subcutaneous tissue, muscle / fascia  Nature of tissue removed:  Slough and Devitalized Tissue  Irrigation volume: 3 L     Irrigation fluid type: Normal Saline   OPERATIVE PROCEDURE: Patient was brought to the operating room and placed in the supine position.  He was placed in stirrups.  The groin, and left thigh was prepped and draped using sterile technique.  Timeout was performed.  The wound VAC was removed.  I used a gauze, as well as a scissors to excise necrotic fat, and debrided the deep portions of the tissue.  The wounds were irrigated copiously with 3 L.  I partially closed the area over the wound VAC, leaving the proximalmost aspect to drain into the wound VAC, given the fact that I do believe that this communicates to some degree with the abscess within the groin.  I then turned the procedure over to the general surgeons, who proceeded with their portion of the case.  They will plan to apply the medium wound VAC to the open area on the thigh once they complete their portion of the procedure.  He may need further operations, as this has certainly been a work and progressive progress.

## 2020-09-27 NOTE — Anesthesia Procedure Notes (Signed)
Procedure Name: Intubation Performed by: Keary Waterson H, CRNA Pre-anesthesia Checklist: Patient identified, Emergency Drugs available, Suction available and Patient being monitored Patient Re-evaluated:Patient Re-evaluated prior to induction Oxygen Delivery Method: Circle System Utilized Preoxygenation: Pre-oxygenation with 100% oxygen Induction Type: IV induction Ventilation: Mask ventilation without difficulty Laryngoscope Size: Miller and 2 Grade View: Grade I Tube type: Oral Tube size: 7.5 mm Number of attempts: 1 Airway Equipment and Method: Stylet and Oral airway Placement Confirmation: ETT inserted through vocal cords under direct vision,  positive ETCO2 and breath sounds checked- equal and bilateral Secured at: 22 cm Tube secured with: Tape Dental Injury: Teeth and Oropharynx as per pre-operative assessment        

## 2020-09-27 NOTE — Op Note (Signed)
Preop diagnosis: left thigh abscess, left groin abscess  Post-op diagnosis: same   Procedure: Incision and drainage of left groin abscess and left thigh abscess   Surgeon: Dr. Phylliss Blakes  Assistant: Hosie Spangle, PA-C   Anesthesia: General  Indications: the patient presents for further irrigation and debridement of left medial thigh/groin abscess, previously drained by Dr. Luisa Hart 09/22/2020. He has multiple abscesses of his left lower extremity and retroperitoneum being managed by orthopedic surgery and interventional radiology. The procedure has been discussed with the patient.  Alternative therapies have been discussed with the patient.  Operative risks include bleeding,  Infection,  Organ injury,  Nerve injury,  Blood vessel injury,  DVT,  Pulmonary embolism,  Death,  And possible reoperation.  Medical management risks include worsening of present situation.  The success of the procedure is 50 -90 % at treating patients symptoms.  The patient understands and agrees to proceed.  Description of the procedure: The patient was seen in the holding area and his left groin marked. After induction of general anesthesia the patient was placed supine on the table in lithotomy position. The left groin, thigh, and scrotum were prepped and draped in sterile fashion. Wound VAC was removed by Dr. Dion Saucier who performed his portion of the case on the left medial thigh - please see his separate operative report for a detailed description of his portion of the surgery. Attention was then turned to the left groin and proximal medial thigh. An existing 3x3 cm wound was present just medial to the crease between his thigh and left groin. 2 cm non-viable tissue was removed from the lateral edge of the wound with electrocautery. The wound was probed and laterally communicated with the more distal thigh wound that was irrigated by Dr. Dion Saucier. Purulence was encountered medially, tracking about 5-6 cm towards the  scrotum. A small amount of non-viable fascia at wound base was removed with electrocautery. This wound also tracked superiorly to communicate with a pre-existing surgical wound measuring roughly 8x2 cm. There was no purulence of the superior wound. This was irrigated but not debrideed. Cautery was used to control bleeding. All three wounds were packed with kerlix gauze moistened with normal saline. Covered with dry dressings, ACE bandage, and mesh underwear. After replacement of dressings, patient was extubated and taken to recovery in satisfactory condition.  Tool used for debridement (curette, scapel, etc.): electrocautery   Frequency of surgical debridement: second visit to the OR with general surgery  Area and depth of devitalized tissue removed from wound: 2 x 2 x 0.5 cm  Blood loss: minimal  Current wound dimensions and depth:  -  Inferior medial thigh: 5x4x8 cm. purulent fluid and non-viable fascia encountered in wound base. Debrided down to muscle.  - Superior medial thigh: 8x2x2cm  - 6 cm tunnel connecting these two wounds   Extent of non viable tissue: minimal, as above 2 x 2 x 0.5 cm   Hosie Spangle, [facility] Please see Amion for pager number during day hours 7:00am-4:30pm

## 2020-09-27 NOTE — Progress Notes (Signed)
Inpatient Rehab Admissions Coordinator:    Pt. In OR today, not medically ready for CIR. Will follow up once Pt has seen therapies following procedure.   Megan Salon, MS, CCC-SLP Rehab Admissions Coordinator  [phone] (celll) [phone] (office)

## 2020-09-27 NOTE — Anesthesia Preprocedure Evaluation (Addendum)
Anesthesia Evaluation  Patient identified by MRN, date of birth, ID band Patient awake    Reviewed: Allergy & Precautions, NPO status , Patient's Chart, lab work & pertinent test results, reviewed documented beta blocker date and time   History of Anesthesia Complications Negative for: history of anesthetic complications  Airway Mallampati: II  TM Distance: >3 FB Neck ROM: Full    Dental  (+) Poor Dentition, Chipped, Dental Advisory Given   Pulmonary Current Smoker and Patient abstained from smoking.,    breath sounds clear to auscultation       Cardiovascular hypertension, Pt. on medications and Pt. on home beta blockers (-) angina Rhythm:Regular Rate:Normal  09/19/2020 ECHO: normal LVF, normal RVF, no significant valvular abnormalities   Neuro/Psych Anxiety Depression negative neurological ROS     GI/Hepatic GERD  Medicated and Controlled,(+) Cirrhosis   Esophageal Varices and ascites  substance abuse  alcohol use and marijuana use, Hepatitis -  Endo/Other  negative endocrine ROS  Renal/GU negative Renal ROS     Musculoskeletal   Abdominal   Peds  Hematology  (+) Blood dyscrasia (Hb 8.1), anemia , INR 1.5   Anesthesia Other Findings   Reproductive/Obstetrics                            Anesthesia Physical Anesthesia Plan  ASA: III  Anesthesia Plan: General   Post-op Pain Management:    Induction: Intravenous and Rapid sequence  PONV Risk Score and Plan: 1 and Ondansetron and Dexamethasone  Airway Management Planned: Oral ETT  Additional Equipment: None  Intra-op Plan:   Post-operative Plan: Extubation in OR  Informed Consent: I have reviewed the patients History and Physical, chart, labs and discussed the procedure including the risks, benefits and alternatives for the proposed anesthesia with the patient or authorized representative who has indicated his/her understanding  and acceptance.     Dental advisory given  Plan Discussed with: CRNA and Surgeon  Anesthesia Plan Comments:        Anesthesia Quick Evaluation

## 2020-09-27 NOTE — Progress Notes (Signed)
[facility]  PROGRESS NOTE  [name] RWE:[id number] DOB: 1986/06/13 DOA: 09/13/2020 PCP: [name], PA   Brief HPI:   35 year old male with past medical history of AVN of the hip, hypertension, GERD he was recently admitted on 08/22/2020 for COVID-19 and was discharged on 08/29/2020.  2 days after discharge patient began to feel extremely weak and tired with symptoms progressing over next 2 weeks.  Patient developed left thigh rash and leg pain with swelling and tenderness to touch.  Patient was seen at PCP office and was told to go to the ED immediately.  He was found to have left iliopsoas and left gluteal abscess with diffuse swelling and left hip effusion on CT scan.  General surgery was consulted.  Blood cultures positive for MSSA bacteremia.  He underwent drainage of the abscess with 3 drain placement by IR in his iliopsoas muscle and multiple abscesses in the legs.  On 09/15/2020 had left hip interposition arthroplasty with antibiotic spacer placement and debridement and developed decompensated septic shock with refractory hypotension despite 2 units PRBC, 2.2 L of crystalloid and 500 cc of albumin.  He was admitted to ICU with vasopressor support.  ID has been consulted and recommend 6 weeks of IV cefazolin. Patient also had scrotal edema anasarca in the setting of IV fluid resuscitation.  Ultrasound was unremarkable for abscess but showed marked asymmetric left scrotal wall thickening and fluid with increased blood flow consistent with soft tissue infection.  Patient had multiple incision and drainage done by general surgery and also multiple aspiration of the abscesses with drain placement per IR.  He continues to need abscess drainage and wound VAC placement as per general surgery, Ortho and IR.    Subjective   Patient seen and examined, continues to have left thigh pain.  Plan to go to the OR today for change of wound VAC.   Assessment/Plan:     1. Left hip septic  arthritis/left thigh fluid collection-patient has history of AVN, developed left thigh abscess.  He underwent drainage of the abscess with drain placement x3, left retroperitoneum, medial left thigh, lateral left thigh and IR on 09/14/2020.  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  Patient underwent left thigh irrigation and debridement with wound VAC placement on 09/20/2020. General surgery took patient to the OR for left thigh irrigation and debridement with change of wound VAC.  Repeat CT showed persistent left anterior thigh fluid collection concerning for abscess, IR has been reconsulted for image guided aspiration/drainage of the remaining left thigh collection.  Plan to undergo incision and drainage of left groin abscess and left thigh abscess as per orthopedics and general surgery.    Continue cefazolin, Flagyl, rifampin.    2. Chronic pain-pain is not controlled, will start MS Contin 15 mg p.o. twice daily, discontinue oxycodone.  We will continue with Dilaudid 1 mg IV every 4 hours as needed for breakthrough pain.    3. Septic shock/MSSA bacteremia-patient presented with septic shock, resolved.  Also developed MSSA bacteremia, likely from complicated iliopsoas/gluteal/thigh abscess.  Infectious disease was consulted.  Patient started on IV cefazolin.  Patient initially underwent TTE which did not show any vegetation.  As it was difficult study so he underwent TEE (transesophageal echocardiogram) on 09/19/2020, which showed no vegetation.  ID is following, will need prolonged course of  antibiotics.  Continue cefazolin, duration of antibiotics per ID.  4. Symptomatic anemia-patient presented with hemoglobin/hematocrit of 6.2/18.9, he received 4 units PRBC.  Anemia panel showed iron  level 44, U IBC 131, TIBC 175, saturation is 25%.  LDH was 308, haptoglobin pending, his hemoglobin was 6.4 yesterday, received another unit of PRBC, hemoglobin was 7.1 yesterday, s/p 1 unit PRBC transfusion given yesterday.  This morning hemoglobin is 8.1.   5. History of alcoholic hepatitis/liver cirrhosis-patient has history of alcohol abuse, he was started on prednisolone for alcoholic hepatitis and was being tapered off.  Prednisone was discontinued due to bacteremia.  LFTs are back to normal.  6. Hyponatremia-likely chronic from underlying liver cirrhosis, likely chronic.  We will continue to monitor sodium.  7. Scrotal edema-patient developed scrotal edema, scrotal ultrasound showed no evidence of testicular mass or torsion, mild asymmetric Scrotal wall thickening and fluid with increased blood flow.  Likely from soft tissue infection.  Continue scrotal support.  8. Thrombocytopenia-platelet count improved.     COVID-19 Labs  No results for input(s): DDIMER, FERRITIN, LDH, CRP in the last 72 hours.  Lab Results  Component Value Date   SARSCOV2NAA NEGATIVE 09/13/2020   SARSCOV2NAA POSITIVE (A) 08/22/2020   SARSCOV2NAA DETECTED (A) 02/24/2019     Scheduled medications:   . (feeding supplement) PROSource Plus  30 mL Oral BID BM  . sodium chloride   Intravenous Once  . sodium chloride   Intravenous Once  . carvedilol  6.25 mg Oral BID WC  . Chlorhexidine Gluconate Cloth  6 each Topical Daily  . docusate sodium  100 mg Oral BID  . feeding supplement  1 Container Oral BID BM  . folic acid  1 mg Oral Daily  . HYDROmorphone      . HYDROmorphone      . lactulose  10 g Oral BID  . mouth rinse  15 mL Mouth Rinse BID  . metroNIDAZOLE  500 mg Oral Q8H  . midazolam      . morphine  15 mg Oral Q12H  . multivitamin with minerals  1 tablet Oral Daily  . nutrition supplement (JUVEN)  1 packet Oral BID BM  . pantoprazole  40 mg Oral BID  . sertraline  25 mg Oral Daily  . sodium  chloride flush  5 mL Intracatheter Q8H  . sodium chloride flush  5 mL Intracatheter Q8H  . thiamine  100 mg Oral Daily      SpO2: 100 % O2 Flow Rate (L/min): 2 L/min    CBC: Recent Labs  Lab 09/21/20 0234 09/21/20 1117 09/23/20 0939 09/24/20 0912 09/25/20 0247 09/26/20 0502 09/27/20 0512  WBC 11.9*  --  14.7* 18.5* 15.1* 14.8* 13.6*  NEUTROABS 9.8*  --   --   --   --   --   --   HGB 6.4*   < > 7.4* 7.2* 7.5* 7.1* 8.1*  HCT 20.3*   < > 22.7* 21.9* 23.3* 22.0* 25.1*  MCV 102.0*  --  100.4* 101.9* 102.2* 102.8* 100.4*  PLT 104*  --  141* 145* 155 159 169   < > = values in this interval not displayed.    Basic Metabolic Panel: Recent Labs  Lab 09/21/20 0234 09/23/20 0243 09/24/20 0912 09/25/20 0247 09/26/20 0502 09/27/20 0512  NA 126* 127* 125* 127* 127* 129*  K 4.8 5.0 4.7 5.3* 4.5 4.2  CL 95* 96* 94* 96* 98 100  CO2 23 23 24 25 23 25   GLUCOSE 173*  142* 112* 92 91 89  BUN 10 11 12 11 10 12   CREATININE 0.34* 0.44* 0.41* 0.44* 0.36* 0.49*  CALCIUM 7.1* 7.5* 7.4* 7.6* 7.3* 7.4*  MG 1.7  --   --   --   --   --   PHOS 3.1  --   --   --   --   --      Liver Function Tests: Recent Labs  Lab 09/21/20 0234 09/23/20 0243 09/24/20 0912 09/25/20 0247 09/26/20 0502  AST 30 31 32 31 28  ALT 11 9 9 8 8   ALKPHOS 214* 211* 203* 198* 195*  BILITOT 1.5* 1.0 0.9 1.1 1.0  PROT 4.8* 5.2* 5.0* 5.2* 5.0*  ALBUMIN 1.7* 1.9* 1.9* 1.8* 1.8*     Antibiotics: Anti-infectives (From admission, onward)   Start     Dose/Rate Route Frequency Ordered Stop   09/26/20 1300  rifampin (RIFADIN) 300 mg in sodium chloride 0.9 % 100 mL IVPB        300 mg 200 mL/hr over 30 Minutes Intravenous Every 12 hours 09/26/20 1156     09/18/20 1400  metroNIDAZOLE (FLAGYL) tablet 500 mg        500 mg Oral Every 8 hours 09/18/20 0927     09/15/20 1715  tobramycin (NEBCIN) powder  Status:  Discontinued          As needed 09/15/20 1715 09/15/20 2043   09/15/20 1715  vancomycin (VANCOCIN) powder   Status:  Discontinued          As needed 09/15/20 1716 09/15/20 2043   09/14/20 0600  ceFAZolin (ANCEF) IVPB 2g/100 mL premix        2 g 200 mL/hr over 30 Minutes Intravenous Every 8 hours 09/14/20 0359     09/13/20 2200  vancomycin (VANCOREADY) IVPB 1500 mg/300 mL  Status:  Discontinued        1,500 mg 150 mL/hr over 120 Minutes Intravenous Every 12 hours 09/13/20 1636 09/14/20 0359   09/13/20 1500  ceFEPIme (MAXIPIME) 2 g in sodium chloride 0.9 % 100 mL IVPB  Status:  Discontinued        2 g 200 mL/hr over 30 Minutes Intravenous Every 8 hours 09/13/20 1437 09/14/20 0359   09/13/20 1130  vancomycin (VANCOREADY) IVPB 1750 mg/350 mL        1,750 mg 175 mL/hr over 120 Minutes Intravenous  Once 09/13/20 1107 09/13/20 1414   09/13/20 1045  ceFAZolin (ANCEF) IVPB 1 g/50 mL premix        1 g 100 mL/hr over 30 Minutes Intravenous  Once 09/13/20 1031 09/13/20 1307       DVT prophylaxis: SCDs  Code Status: Full code  Family Communication: Discussed with patient girlfriend at bedside   Consultants:  General surgery  Orthopedics  Infectious disease  Interventional radiology  Procedures:  TOTAL HIP ARTHROPLASTY WITH CEMENT INTERPOSITION SPACING for septic hip arthritis from septicemia in the setting of alcoholic AVN of the left hip  Drainage of the abscess with 3 drain placement by IR in iliopsoas muscle and multiple abscesses in the legs     Objective   Vitals:   09/27/20 1300 09/27/20 1315 09/27/20 1330 09/27/20 1345  BP: 117/88 120/85 113/83 118/82  Pulse: 94 95 99 (!) 101  Resp: 13 19 13 15   Temp:      TempSrc:      SpO2: 100% 100% 99% 100%  Weight:      Height:  Intake/Output Summary (Last 24 hours) at 09/27/2020 1410 Last data filed at 09/27/2020 1231 Gross per 24 hour  Intake 2079.35 ml  Output 1065 ml  Net 1014.35 ml    03/06 1901 - 03/08 0700 In: 5450.3 [P.O.:1680; I.V.:2359.5] Out: 2416 [Urine:1750; Drains:665]  Filed Weights   09/19/20 0500  09/19/20 0843 09/21/20 0500  Weight: 91.8 kg 97.8 kg 93.9 kg    Physical Examination:   General-appears in no acute distress Heart-S1-S2, regular, no murmur auscultated Lungs-clear to auscultation bilaterally, no wheezing or crackles auscultated Abdomen-soft, nontender, no organomegaly Extremities-left upper thigh edema, wound VAC in place Neuro-alert, oriented x3, no focal deficit noted  Status is: Inpatient  Dispo: The patient is from: Home              Anticipated d/c is to: Skilled nursing facility              Anticipated d/c date is: 09/29/2020              Patient currently not stable for discharge  Barrier to discharge-ongoing management for iliopsoas abscess       Janequa Kipnis S Raphel Stickles   [facility] If 7PM-7AM, please contact night-coverage at [email or web address], Office  [phone]   09/27/2020, 2:10 PM  LOS: 14 days

## 2020-09-27 NOTE — Anesthesia Postprocedure Evaluation (Signed)
Anesthesia Post Note  Patient: [name]  Procedure(s) Performed: IRRIGATION AND DEBRIDEMENT LEFT THIGH WITH WOUND  VAC APPLICATION (Left Thigh) IRRIGATION AND DEBRIDEMENT LEFT GROIN (Left Groin)     Patient location during evaluation: PACU Anesthesia Type: General Level of consciousness: patient cooperative, oriented and sedated Pain management: pain level controlled Vital Signs Assessment: post-procedure vital signs reviewed and stable Respiratory status: spontaneous breathing, nonlabored ventilation, respiratory function stable and patient connected to nasal cannula oxygen Cardiovascular status: blood pressure returned to baseline and stable Postop Assessment: no apparent nausea or vomiting Anesthetic complications: no   No complications documented.  Last Vitals:  Vitals:   09/27/20 1345 09/27/20 1410  BP: 118/82 115/82  Pulse: (!) 101 94  Resp: 15 16  Temp:  36.5 C  SpO2: 100% 100%    Last Pain:  Vitals:   09/27/20 1410  TempSrc: Oral  PainSc:                  Marvin Grabill,E. Krystina Strieter

## 2020-09-27 NOTE — Progress Notes (Addendum)
The patient has been re-examined, and the chart reviewed, and there have been no interval changes to the documented history and physical.    The risks, benefits, and alternatives have been discussed at length, and the patient is willing to proceed.    The patient's left sided wound VAC has been primarily putting out serous fluid, no significant purulence, and we will plan for repeat I&D of the left thigh, with possible closure versus exchange of the wound VAC depending on how clean it looks, and we will do this in combination with general surgery managing the groin abscess.  Eulas Post, MD

## 2020-09-27 NOTE — Transfer of Care (Signed)
Immediate Anesthesia Transfer of Care Note  Patient: [name]  Procedure(s) Performed: IRRIGATION AND DEBRIDEMENT LEFT THIGH WITH WOUND  VAC APPLICATION (Left Thigh) IRRIGATION AND DEBRIDEMENT LEFT GROIN (Left Groin)  Patient Location: PACU  Anesthesia Type:General  Level of Consciousness: awake  Airway & Oxygen Therapy: Patient Spontanous Breathing  Post-op Assessment: Report given to RN and Post -op Vital signs reviewed and stable  Post vital signs: Reviewed and stable  Last Vitals:  Vitals Value Taken Time  BP 128/93 09/27/20 1230  Temp 36.7 C 09/27/20 1227  Pulse 106 09/27/20 1231  Resp 25 09/27/20 1231  SpO2 96 % 09/27/20 1231  Vitals shown include unvalidated device data.  Last Pain:  Vitals:   09/27/20 0927  TempSrc:   PainSc: 5       Patients Stated Pain Goal: 3 (09/27/20 0927)  Complications: No complications documented.

## 2020-09-27 NOTE — Progress Notes (Signed)
Nutrition Follow-up  INTERVENTION:   - girlfriend to bring in Pantops Protein shakes that patient was consuming PTA  - Boost Breeze BID, each supplement provides 250 kcal and 9 grams of protein  - 30 ml Prosource Plus BID, each supplement provides 100 kcal and 15 grams protein  - Juven BID, each packet provides 95 calories, 2.5 grams of protein (collagen), and 9.8 grams of carbohydrate (3 grams sugar); also contains 7 grams of L-arginine and L-glutamine, 300 mg vitamin C, 15 mg vitamin E, 1.2 mcg vitamin B-12, 9.5 mg zinc, 200 mg calcium, and 1.5 g  Calcium Beta-hydroxy-Beta-methylbutyrate to support wound healing   NUTRITION DIAGNOSIS:   Increased nutrient needs related to chronic illness as evidenced by estimated needs.  Ongoing.  GOAL:   Patient will meet greater than or equal to 90% of their needs  Progressing.  MONITOR:   PO intake,Supplement acceptance,Labs,Weight trends,Skin  ASSESSMENT:   35 y.o. male who complains of acute on chronic left hip pain. He was scheduled for hip replacement surgery in early March, and has had a series of progressive medical complications including thrombocytopenia, liver failure, COVID-19, progressive inability to ambulate, as well as kidney failure.  He has had progressive edema in the left lower extremity.  He denies fevers or chills.  He denies IV drug use.  He has been decreasing his alcohol intake in preparation for his hip replacement surgery.  He has progressed now to the point where he cannot place weight on his left lower extremity, and is unable to function.  He was brought into the emergency room with severe anemia, requiring blood transfusion, jaundice, and left lower extremity swelling.  2/23: s/p CT guided aspiration/drainage of left thigh fluid collections in IR. 2/24: s/p left hip interposition arthroplasty and debridement 3/1: s/p left thigh I&D w/ wound VAC placement 3/3: s/p repeat I&D  Patient was in the OR today for  another I&D of left groin abscess and left thigh abscess.  Pt was NPO today for procedure. Consumed 50-100% of meals yesterday when on regular diet.   Admission weight: 174 lbs. Current weight: 207 lbs.  I/Os: +5.2L since admit UOP: 600 ml x 24 hrs Drains: 240 ml   Medications: Colace, Folic acid, Lactulose, MVI,  Senokot, Thiamine, IV Zofran    Labs reviewed: Low Na  Diet Order:   Diet Order            Diet regular Room service appropriate? Yes; Fluid consistency: Thin  Diet effective now                 EDUCATION NEEDS:   No education needs have been identified at this time  Skin:  Skin Assessment: Skin Integrity Issues: Skin Integrity Issues:: Incisions Incisions: L thigh and groin (3/1)  Last BM:  2/27  Height:   Ht Readings from Last 1 Encounters:  09/19/20 5\' 10"  (1.778 m)    Weight:   Wt Readings from Last 1 Encounters:  09/21/20 93.9 kg   BMI:  Body mass index is 29.7 kg/m.  Estimated Nutritional Needs:   Kcal:  2200-2400 kcal  Protein:  125-140 grams  Fluid:  >/= 2.3 L/day  11/21/20, MS, RD, LDN Inpatient Clinical Dietitian Contact information available via Amion

## 2020-09-28 ENCOUNTER — Inpatient Hospital Stay (HOSPITAL_COMMUNITY): Payer: 59

## 2020-09-28 ENCOUNTER — Encounter (HOSPITAL_COMMUNITY): Payer: Self-pay | Admitting: Orthopedic Surgery

## 2020-09-28 DIAGNOSIS — K6812 Psoas muscle abscess: Secondary | ICD-10-CM | POA: Diagnosis not present

## 2020-09-28 DIAGNOSIS — L02214 Cutaneous abscess of groin: Secondary | ICD-10-CM

## 2020-09-28 DIAGNOSIS — F1029 Alcohol dependence with unspecified alcohol-induced disorder: Secondary | ICD-10-CM | POA: Diagnosis not present

## 2020-09-28 DIAGNOSIS — K7010 Alcoholic hepatitis without ascites: Secondary | ICD-10-CM | POA: Diagnosis not present

## 2020-09-28 DIAGNOSIS — L0390 Cellulitis, unspecified: Secondary | ICD-10-CM | POA: Diagnosis not present

## 2020-09-28 DIAGNOSIS — R7881 Bacteremia: Secondary | ICD-10-CM | POA: Diagnosis not present

## 2020-09-28 LAB — CBC
HCT: 24.5 % — ABNORMAL LOW (ref 39.0–52.0)
Hemoglobin: 7.7 g/dL — ABNORMAL LOW (ref 13.0–17.0)
MCH: 32.8 pg (ref 26.0–34.0)
MCHC: 31.4 g/dL (ref 30.0–36.0)
MCV: 104.3 fL — ABNORMAL HIGH (ref 80.0–100.0)
Platelets: 176 10*3/uL (ref 150–400)
RBC: 2.35 MIL/uL — ABNORMAL LOW (ref 4.22–5.81)
RDW: 18.8 % — ABNORMAL HIGH (ref 11.5–15.5)
WBC: 14 10*3/uL — ABNORMAL HIGH (ref 4.0–10.5)
nRBC: 0 % (ref 0.0–0.2)

## 2020-09-28 LAB — PHOSPHORUS: Phosphorus: 2.5 mg/dL (ref 2.5–4.6)

## 2020-09-28 LAB — COMPREHENSIVE METABOLIC PANEL
ALT: 8 U/L (ref 0–44)
AST: 28 U/L (ref 15–41)
Albumin: 1.9 g/dL — ABNORMAL LOW (ref 3.5–5.0)
Alkaline Phosphatase: 235 U/L — ABNORMAL HIGH (ref 38–126)
Anion gap: 9 (ref 5–15)
BUN: 14 mg/dL (ref 6–20)
CO2: 21 mmol/L — ABNORMAL LOW (ref 22–32)
Calcium: 7.6 mg/dL — ABNORMAL LOW (ref 8.9–10.3)
Chloride: 100 mmol/L (ref 98–111)
Creatinine, Ser: 0.41 mg/dL — ABNORMAL LOW (ref 0.61–1.24)
GFR, Estimated: >60 mL/min (ref >60)
Glucose, Bld: 130 mg/dL — ABNORMAL HIGH (ref 70–99)
Potassium: 4.1 mmol/L (ref 3.5–5.1)
Sodium: 130 mmol/L — ABNORMAL LOW (ref 135–145)
Total Bilirubin: 0.9 mg/dL (ref 0.3–1.2)
Total Protein: 5.3 g/dL — ABNORMAL LOW (ref 6.5–8.1)

## 2020-09-28 LAB — MAGNESIUM: Magnesium: 1.7 mg/dL (ref 1.7–2.4)

## 2020-09-28 MED ORDER — HYDROMORPHONE HCL 1 MG/ML IJ SOLN
1.0000 mg | Freq: Two times a day (BID) | INTRAMUSCULAR | Status: DC | PRN
  Administered 2020-09-29 – 2020-10-01 (×5): 1 mg via INTRAVENOUS
  Administered 2020-10-02: 20:00:00 2 mg via INTRAVENOUS
  Administered 2020-10-02 – 2020-10-10 (×17): 1 mg via INTRAVENOUS
  Filled 2020-09-28 (×25): qty 1

## 2020-09-28 MED ORDER — HYDROMORPHONE HCL 1 MG/ML IJ SOLN
1.0000 mg | Freq: Once | INTRAMUSCULAR | Status: AC
Start: 2020-09-28 — End: 2020-09-28
  Administered 2020-09-28: 1 mg via INTRAVENOUS

## 2020-09-28 NOTE — Progress Notes (Signed)
PT Cancellation Note  Patient Details Name: JUAQUIN LUDINGTON MRN: [id number] DOB: [birth year]   Cancelled Treatment:     pt did work with OT earlier bed level TE's.  This afternoon receiving VAS [place] LE.  Will attempt to see another day as schedule permits.     Felecia Shelling  PTA Acute  Rehabilitation Services Pager      [phone] Office      [phone]

## 2020-09-28 NOTE — TOC Progression Note (Signed)
Transition of Care [facility]) - Progression Note    Patient Details  Name: [name] MRN: [id number] Date of Birth: Mar 25, 1986  Transition of [facility]) CM/SW Contact  Lennart Pall, LCSW Phone Number: 09/28/2020, 1:19 PM  Clinical Narrative:    Met briefly with pt to introduce self since move to current unit.  Pt aware TOC will continue to follow to assist with transition to CIR once medically ready.  Pt very pleasant and denies any concerns at this time.    Expected Discharge Plan: [place] (CIR) Barriers to Discharge: Continued Medical Work up  Expected Discharge Plan and Services Expected Discharge Plan: [place] (CIR)   Discharge Planning Services: CM Consult   Living arrangements for the past 2 months: Single Family Home                                       Social Determinants of Health (SDOH) Interventions    Readmission Risk Interventions No flowsheet data found.

## 2020-09-28 NOTE — Progress Notes (Addendum)
Subjective:   Patient is alert, oriented, laying in bed. Reports pain at left groin to be moderate-severe, worse with movement. Left total hip dressings changed this am, left groin changes not yet changed today.  Denies chest pain, SOB. No nausea/vomiting. No other complaints.  Objective:  PE: VITALS:   Vitals:   09/27/20 2045 09/27/20 2241 09/28/20 0226 09/28/20 0545  BP: 116/80 111/74 118/80 122/81  Pulse: (!) 103 (!) 103 (!) 110 (!) 108  Resp: 18 18 17 17   Temp: 98.5 F (36.9 C) 98.9 F (37.2 C) 98.3 F (36.8 C) 98.9 F (37.2 C)  TempSrc: Oral Oral Oral Oral  SpO2: 96% 96% 98% 99%  Weight:    93.2 kg  Height:       General: alert, oriented, sitting up in bed Resp: no increased respiratory effort MSK: No left calf swelling, though TTP. Sensation intact to all aspects of left foot. + DP pulse.Dorsiflexion/Plantar flexion intact.Dressing to left hip and left groin in place. Left hip dressing CDI. Left groin dressing with moderate serous drainage. Closed areas from procedures continue to be well approximated.   LABS  Results for orders placed or performed during the hospital encounter of 09/13/20 (from the past 24 hour(s))  CBC     Status: Abnormal   Collection Time: 09/28/20  4:42 AM  Result Value Ref Range   WBC 14.0 (H) 4.0 - 10.5 K/uL   RBC 2.35 (L) 4.22 - 5.81 MIL/uL   Hemoglobin 7.7 (L) 13.0 - 17.0 g/dL   HCT 11/28/20 (L) 16.9 - 67.8 %   MCV 104.3 (H) 80.0 - 100.0 fL   MCH 32.8 26.0 - 34.0 pg   MCHC 31.4 30.0 - 36.0 g/dL   RDW 93.8 (H) 10.1 - 75.1 %   Platelets 176 150 - 400 K/uL   nRBC 0.0 0.0 - 0.2 %  Comprehensive metabolic panel     Status: Abnormal   Collection Time: 09/28/20  4:42 AM  Result Value Ref Range   Sodium 130 (L) 135 - 145 mmol/L   Potassium 4.1 3.5 - 5.1 mmol/L   Chloride 100 98 - 111 mmol/L   CO2 21 (L) 22 - 32 mmol/L   Glucose, Bld 130 (H) 70 - 99 mg/dL   BUN 14 6 - 20 mg/dL   Creatinine, Ser 11/28/20 (L) 0.61 - 1.24 mg/dL   Calcium 7.6  (L) 8.9 - 10.3 mg/dL   Total Protein 5.3 (L) 6.5 - 8.1 g/dL   Albumin 1.9 (L) 3.5 - 5.0 g/dL   AST 28 15 - 41 U/L   ALT 8 0 - 44 U/L   Alkaline Phosphatase 235 (H) 38 - 126 U/L   Total Bilirubin 0.9 0.3 - 1.2 mg/dL   GFR, Estimated 8.52 >77 mL/min   Anion gap 9 5 - 15    >82 SCROTUM  Result Date: 09/27/2020 CLINICAL DATA:  Swelling.  Drain placement in left thigh on 03/04. EXAM: SCROTAL ULTRASOUND DOPPLER ULTRASOUND OF THE TESTICLES TECHNIQUE: Complete ultrasound examination of the testicles, epididymis, and other scrotal structures was performed. Color and spectral Doppler ultrasound were also utilized to evaluate blood flow to the testicles. COMPARISON:  Ultrasound dated 09/17/2020 FINDINGS: Right testicle Measurements: 3 x 2.1 x 2.3 cm. No mass or microlithiasis visualized. Left testicle Measurements: 3.5 x 2.1 x 1.8 cm. No mass or microlithiasis visualized. Right epididymis:  Normal in size and appearance. Left epididymis:  Normal in size and appearance. Hydrocele: There are bilateral relatively small hydroceles,  right greater than left. Varicocele:  None visualized. Pulsed Doppler interrogation of both testes demonstrates normal low resistance arterial and venous waveforms bilaterally. There is severe diffuse scrotal wall thickening, greatest on the left. IMPRESSION: 1. No testicular mass. 2. No testicular torsion. 3. Diffuse scrotal wall thickening, greatest on the left. Findings are concerning for an underlying soft tissue infection. The soft tissues are edematous without a clear drainable fluid collection. Electronically Signed   By: Katherine Mantle M.D.   On: 09/27/2020 17:06    Assessment/Plan: Left thigh septic arthritis/ multiple left thigh abscess - 2/23 - placement of drain into left retroperitoneal abscess and two into left thigh by IR - 2/24 - left hip total arthroplasty with cement interposition spacer  - 3/1 - left thigh irrigation and debridement with wound vac placement by  orthopedics - 3/3 - I&D of left groin abscess with change of wound vac by general surgery - 3/4 - drain placement in left mid thigh by IR - 3/8 - left thigh repeat irrigation and debridement by orthopedics with partial closure of wound, incision and drainage of left thigh abscess/left groin abscess by general surgery - wound vac was not applied as planned due to direct communication between abscesses - WBAT LLE - pharmacologic DVT prophylaxis per primary due to coagulopathy, SCD's  - continue BID dressing changes to left hip wound with gauze and ABD's, this continues to drain serous fluid but no pus has been seen - dressing changes to left groin per general surgery - patient has new left calf TTP which had previously resolved, no significant edema but will get ultrasound to rule out DVT   Contact information:   Weekdays 8-5 Janine Ores, PA-C [phone] A fter hours and holidays please check [email or web address] for group call information for [facility]  Armida Sans 09/28/2020, 7:58 AM

## 2020-09-28 NOTE — CV Procedure (Signed)
Limited LLE venous duplex performed.  Results can be found under chart review under CV PROC. 09/28/2020 4:46 PM Deeric Cruise RVT, RDMS

## 2020-09-28 NOTE — Progress Notes (Signed)
Referring Physician(s): Landau,J/Lama,G  Supervising Physician: Malachy Moan  Patient Status:  [facility] - In-pt  Chief Complaint: Left hip/thigh pain/abscesses   Subjective: Pt continues to have left thigh/hip discomfort, increasing abdominal distention and fatigue as well as scrotal edema   Allergies: Patient has no active allergies.  Medications: Prior to Admission medications   Medication Sig Start Date End Date Taking? Authorizing Provider  carvedilol (COREG) 3.125 MG tablet Take 3.125 mg by mouth 2 (two) times daily. 07/28/20  Yes [provider]  folic acid (FOLVITE) 1 MG tablet Take 1 tablet (1 mg total) by mouth daily. 08/30/20  Yes Pokhrel, Laxman, MD  guaiFENesin-dextromethorphan (ROBITUSSIN DM) 100-10 MG/5ML syrup Take 5 mLs by mouth every 4 (four) hours as needed for cough (chest congestion). Patient taking differently: Take 10 mLs by mouth every 4 (four) hours as needed for cough (chest congestion). 08/29/20  Yes Pokhrel, Laxman, MD  lactulose (CHRONULAC) 10 GM/15ML solution Take 15 mLs (10 g total) by mouth 2 (two) times daily. 08/29/20  Yes Pokhrel, Laxman, MD  magnesium oxide (MAG-OX) 400 MG tablet Take 1 tablet by mouth daily. 07/28/20  Yes [provider]  pantoprazole (PROTONIX) 40 MG tablet Take 1 tablet (40 mg total) by mouth 2 (two) times daily before a meal for 28 days, THEN 1 tablet (40 mg total) daily for 28 days. 08/29/20 10/24/20 Yes Pokhrel, Laxman, MD  prednisoLONE (ORAPRED) 15 MG/5ML solution Take 1.6-13.3 mLs by mouth See admin instructions. Take 13.44ml by mouth daily for 28 days, 31ml for 4 days, 6.39ml daily for 4 days, 3.3 daily for 4 days, then 1.2ml daily for 3 days. 08/29/20  Yes [provider]  sertraline (ZOLOFT) 25 MG tablet Take 25 mg by mouth at bedtime. 08/20/20  Yes [provider]  thiamine 100 MG tablet Take 1 tablet (100 mg total) by mouth daily. 08/30/20  Yes Pokhrel, Laxman, MD  buPROPion (WELLBUTRIN XL) 300 MG 24  hr tablet Take 1 tablet (300 mg total) by mouth daily. Patient not taking: No sig reported 10/16/13   Rachael Fee, MD  carbamazepine (TEGRETOL XR) 200 MG 12 hr tablet Take 1 tablet (200 mg total) by mouth at bedtime. Patient not taking: No sig reported 10/16/13   Rachael Fee, MD  hydrOXYzine (ATARAX/VISTARIL) 25 MG tablet Take 1 tablet (25 mg total) by mouth every 6 (six) hours as needed for anxiety (or CIWA score </= 10). Patient not taking: No sig reported 10/16/13   Rachael Fee, MD  lisinopril-hydrochlorothiazide (ZESTORETIC) 20-25 MG tablet Take 1 tablet by mouth daily. Patient not taking: No sig reported 08/31/20   Pokhrel, Rebekah [place], MD  oxyCODONE (ROXICODONE) 5 MG immediate release tablet Take 1 tablet (5 mg total) by mouth every 8 (eight) hours as needed. Patient not taking: No sig reported 08/29/20 08/29/21  Pokhrel, Rebekah [place], MD  prednisoLONE 5 MG TABS tablet Take 8 tablets (40 mg total) by mouth daily for 28 days, THEN 6 tablets (30 mg total) daily for 4 days, THEN 4 tablets (20 mg total) daily for 4 days, THEN 2 tablets (10 mg total) daily for 4 days, THEN 1 tablet (5 mg total) every 3 (three) days for 1 day. Patient not taking: Reported on 09/13/2020 08/29/20 10/09/20  Joycelyn Das, MD  traMADol (ULTRAM) 50 MG tablet Take 1 tablet (50 mg total) by mouth every 6 (six) hours as needed. Patient not taking: Reported on 09/13/2020 08/29/20   Joycelyn Das, MD     Vital Signs:  BP 124/81 (BP Location: Right Arm)   Pulse (!) 127   Temp 98.8 F (37.1 C) (Oral)   Resp 16   Ht 5\' 10"  (1.778 m)   Wt 205 lb 7.5 oz (93.2 kg)   SpO2 95%   BMI 29.48 kg/m   Physical Exam:  awake but appears very fatigued; left thigh/groin drains remain intact, OP range from 20-70 cc serosang fluid, sites sl tender to palpation, sl more so near groin  Imaging: SCROTUM  Result Date: 09/27/2020 CLINICAL DATA:  Swelling.  Drain placement in left thigh on 03/04. EXAM: SCROTAL ULTRASOUND DOPPLER ULTRASOUND OF THE  TESTICLES TECHNIQUE: Complete ultrasound examination of the testicles, epididymis, and other scrotal structures was performed. Color and spectral Doppler ultrasound were also utilized to evaluate blood flow to the testicles. COMPARISON:  Ultrasound dated 09/17/2020 FINDINGS: Right testicle Measurements: 3 x 2.1 x 2.3 cm. No mass or microlithiasis visualized. Left testicle Measurements: 3.5 x 2.1 x 1.8 cm. No mass or microlithiasis visualized. Right epididymis:  Normal in size and appearance. Left epididymis:  Normal in size and appearance. Hydrocele: There are bilateral relatively small hydroceles, right greater than left. Varicocele:  None visualized. Pulsed Doppler interrogation of both testes demonstrates normal low resistance arterial and venous waveforms bilaterally. There is severe diffuse scrotal wall thickening, greatest on the left. IMPRESSION: 1. No testicular mass. 2. No testicular torsion. 3. Diffuse scrotal wall thickening, greatest on the left. Findings are concerning for an underlying soft tissue infection. The soft tissues are edematous without a clear drainable fluid collection. Electronically Signed   By: 09/19/2020 M.D.   On: 09/27/2020 17:06    Labs:  CBC: Recent Labs    09/25/20 0247 09/26/20 0502 09/27/20 0512 09/28/20 0442  WBC 15.1* 14.8* 13.6* 14.0*  HGB 7.5* 7.1* 8.1* 7.7*  HCT 23.3* 22.0* 25.1* 24.5*  PLT 155 159 169 176    COAGS: Recent Labs    08/22/20 1817 08/25/20 0349 09/13/20 1045 09/14/20 0525 09/15/20 2300 09/19/20 0305  INR 1.4*   < > 1.4* 1.5* 2.2* 1.5*  APTT 42*  --   --   --   --   --    < > = values in this interval not displayed.    BMP: Recent Labs    09/25/20 0247 09/26/20 0502 09/27/20 0512 09/28/20 0442  NA 127* 127* 129* 130*  K 5.3* 4.5 4.2 4.1  CL 96* 98 100 100  CO2 25 23 25  21*  GLUCOSE 92 91 89 130*  BUN 11 10 12 14   CALCIUM 7.6* 7.3* 7.4* 7.6*  CREATININE 0.44* 0.36* 0.49* 0.41*  GFRNONAA >60 >60 >60 >60     LIVER FUNCTION TESTS: Recent Labs    09/24/20 0912 09/25/20 0247 09/26/20 0502 09/28/20 0442  BILITOT 0.9 1.1 1.0 0.9  AST 32 31 28 28   ALT 9 8 8 8   ALKPHOS 203* 198* 195* 235*  PROT 5.0* 5.2* 5.0* 5.3*  ALBUMIN 1.9* 1.8* 1.8* 1.9*    Assessment and Plan: Pt with history of left hip septic arthritis/AVNcomplicated by development of left retroperitoneal and left thigh fluid collections concerning for abscesses s/p drain placement x3 (left retroperitoneal, medial left thigh, lateral left thigh) in IR 09/14/2020; s/p removal of left retroperitoneal drain in OR 09/20/2020; with repeat CT revealing persistent left anterior thigh fluid collection concerning for abscess; underwent additional I&D of left groin and left thigh abscesses yesterday along with excisional debridement of skin, subcutaneous tissue, muscle with partial closure; afebrile,  tachycardic, WBC 14 up slightly from 13.6, hemoglobin 7.7 down from 8.1, creatinine 0.41, scrotal ultrasound yesterday showing diffuse wall thickening greatest on left with findings concerning for underlying  soft tissue infection- urology has seen; cont current tx/close OP/lab monitoring; monitor for assoc DVT; rec f/u CT once drain OP are minimal or if WBC rises   Electronically Signed: D. Jeananne Rama, PA-C 09/28/2020, 2:18 PM   I spent a total of 15 minutes at the the patient's bedside AND on the patient's hospital floor or unit, greater than 50% of which was counseling/coordinating care for left thigh/hip abscess drains    Patient ID: [name], male   DOB: 03/09/1986, 35 y.o.   MRN: [id number]

## 2020-09-28 NOTE — Progress Notes (Signed)
Occupational Therapy Treatment Patient Details Name: [name] MRN: [id number] DOB: [birth year] Today's Date: 09/28/2020    History of present illness [name] is a 35 y.o. male with medical history significant of AVN of hip ( originally was scheduled for a THA this coming March2022), HTN, GERD. Recent admission for COVID , acute renal failure and alcoholic hepatitis (08/22/20- 08/29/20). He was ok for a few days after discharge, but he began to feel very tired, weak, left thigh and leg pain and progressed over the last 2 weeks. He reports that the left thigh had become increasingly swollen and tender to touch. He has had a blister rupture on the inner thigh in the groin area. These symptoms increased through 2 days ago when he was no longer ago to walk without great pain. 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 Left thigh I & D and wound vac placement 3/1. s/p I&D groin abcess by general surgery 09/23/19. Additional drain placed L thigh per IR 09/23/20   OT comments  Pt. Is making steady gains. Pt. Had additional sx yesterday and did not want to get out of bed. Pt. Preformed ADLs and there ex. In supine. Acute ot to follow.   Follow Up Recommendations       Equipment Recommendations  None recommended by OT    Recommendations for Other Services Rehab consult    Precautions / Restrictions Precautions Precautions: Fall;Posterior Hip Precaution Comments: pre medicate for pain, multiple lines/drains Restrictions Weight Bearing Restrictions: No LLE Weight Bearing: Weight bearing as tolerated       Mobility Bed Mobility                     Transfers                      Balance                                           ADL either performed or assessed with clinical judgement   ADL       Grooming: Set up;Wash/dry hands;Wash/dry face;Bed level           Upper Body Dressing : Set up;Bed level                     General ADL Comments: Pt. deferred EOB or OOB secondary to pain. Pt. states they did more sx on groin yesterday.     Vision   Vision Assessment?: No apparent visual deficits   Perception     Praxis      Cognition Arousal/Alertness: Awake/alert Behavior During Therapy: WFL for tasks assessed/performed Overall Cognitive Status: Within Functional Limits for tasks assessed                                          Exercises Exercises: General Upper Extremity (Pt. preformed B UE SHLD flex/ext, bicep/tricep for 20 reps times 3 sets.) General Exercises - Upper Extremity Shoulder Flexion: AROM;20 reps;Prone;Theraband Shoulder  ABduction: AROM;Both;20 reps;Theraband Elbow Flexion: AROM;Both;20 reps;Theraband Elbow Extension: AROM;Both;20 reps;Theraband   Shoulder Instructions       General Comments      Pertinent Vitals/ Pain       Pain Assessment: 0-10 Pain Score: 6  Pain Location: Lt hip and scrotal edema Pain Descriptors / Indicators: Discomfort;Grimacing;Operative site guarding;Guarding Pain Intervention(s): Monitored during session;Premedicated before session  Home Living                                          Prior Functioning/Environment              Frequency  Min 2X/week        Progress Toward Goals  OT Goals(current goals can now be found in the care plan section)  Progress towards OT goals: Progressing toward goals  Acute Rehab OT Goals Patient Stated Goal: to be able to walk again and more independent OT Goal Formulation: With patient Time For Goal Achievement: 10/02/20 Potential to  Achieve Goals: Good ADL Goals Pt Will Perform Lower Body Dressing: with supervision;with adaptive equipment;sit to/from stand Pt Will Transfer to Toilet: ambulating;grab bars;regular height toilet;with min guard assist Pt Will Perform Toileting - Clothing Manipulation and hygiene: with supervision;sit to/from stand Additional ADL Goal #1: Patient will stand at sink to perform grooming task as evidence of improving activity tolerance Additional ADL Goal #2: Patient will perform supine to sit with min assist in preparation for functional task.  Plan Discharge plan remains appropriate    Co-evaluation                 AM-PAC OT "6 Clicks" Daily Activity     Outcome Measure   Help from another person eating meals?: None Help from another person taking care of personal grooming?: A Little Help from another person toileting, which includes using toliet, bedpan, or urinal?: Total Help from another person bathing (including washing, rinsing, drying)?: A Lot Help from another person to put on and taking off regular upper body clothing?: A Little Help from another person to put on and taking off regular lower body clothing?: Total 6 Click Score: 14    End of Session    OT Visit Diagnosis: Other abnormalities of gait and mobility (R26.89);Muscle weakness (generalized) (M62.81);Pain Pain - Right/Left: Left Pain - part of body: Hip   Activity Tolerance Patient limited by pain   Patient Left in bed;with call bell/phone within reach;with family/visitor present   Nurse Communication Mobility status        Time: 3785-8850 OT Time Calculation (min): 25 min  Charges: OT General Charges $OT Visit: 1 Visit OT Treatments $Self Care/Home Management : 8-22 mins $Therapeutic Exercise: 8-22 mins  Derrek Gu OT/L    Terrea Bruster 09/28/2020, 12:26 PM

## 2020-09-28 NOTE — Progress Notes (Addendum)
Patient ID: [name], male   DOB: 11/16/1985, 35 y.o.   MRN: [id number]  1 Day Post-Op Subjective: Pt seen by Dr. Marlou Porch last week.  Urology now reconsulted due to persistent scrotal edema.  Objective: Vital signs in last 24 hours: Temp:  [97.7 F (36.5 C)-98.9 F (37.2 C)] 98 F (36.7 C) (03/09 0931) Pulse Rate:  [94-127] 127 (03/09 0931) Resp:  [13-20] 14 (03/09 0931) BP: (111-128)/(74-93) 125/84 (03/09 0931) SpO2:  [94 %-100 %] 96 % (03/09 0931) Weight:  [93.2 kg] 93.2 kg (03/09 0545)  Intake/Output from previous day: 03/08 0701 - 03/09 0700 In: 1165.2 [P.O.:240; I.V.:500; IV Piggyback:395.2] Out: 1312 [Urine:1150; Drains:112; Blood:50] Intake/Output this shift: Total I/O In: -  Out: 100 [Urine:100]  Physical Exam:  General: Alert and oriented GU: Mild scrotal edema, firmness of left hemiscrotum and unable to examine left testis, but no fluctuance or evidence of abscess. Right testis is palpably normal with minimal edema of right hemiscrotum. Foley in place.  Lab Results: Recent Labs    09/26/20 0502 09/27/20 0512 09/28/20 0442  HGB 7.1* 8.1* 7.7*  HCT 22.0* 25.1* 24.5*   CBC Latest Ref Rng & Units 09/28/2020 09/27/2020 09/26/2020  WBC 4.0 - 10.5 K/uL 14.0(H) 13.6(H) 14.8(H)  Hemoglobin 13.0 - 17.0 g/dL 7.7(L) 8.1(L) 7.1(L)  Hematocrit 39.0 - 52.0 % 24.5(L) 25.1(L) 22.0(L)  Platelets 150 - 400 K/uL 176 169 159     BMET Recent Labs    09/27/20 0512 09/28/20 0442  NA 129* 130*  K 4.2 4.1  CL 100 100  CO2 25 21*  GLUCOSE 89 130*  BUN 12 14  CREATININE 0.49* 0.41*  CALCIUM 7.4* 7.6*     Studies/Results: US SCROTUM  Result Date: 09/27/2020 CLINICAL DATA:  Swelling.  Drain placement in left thigh on 03/04. EXAM: SCROTAL ULTRASOUND DOPPLER ULTRASOUND OF THE TESTICLES TECHNIQUE: Complete ultrasound examination of the testicles, epididymis, and other scrotal structures was performed. Color and spectral Doppler ultrasound were also utilized to evaluate  blood flow to the testicles. COMPARISON:  Ultrasound dated 09/17/2020 FINDINGS: Right testicle Measurements: 3 x 2.1 x 2.3 cm. No mass or microlithiasis visualized. Left testicle Measurements: 3.5 x 2.1 x 1.8 cm. No mass or microlithiasis visualized. Right epididymis:  Normal in size and appearance. Left epididymis:  Normal in size and appearance. Hydrocele: There are bilateral relatively small hydroceles, right greater than left. Varicocele:  None visualized. Pulsed Doppler interrogation of both testes demonstrates normal low resistance arterial and venous waveforms bilaterally. There is severe diffuse scrotal wall thickening, greatest on the left. IMPRESSION: 1. No testicular mass. 2. No testicular torsion. 3. Diffuse scrotal wall thickening, greatest on the left. Findings are concerning for an underlying soft tissue infection. The soft tissues are edematous without a clear drainable fluid collection. Electronically Signed   By: Katherine Mantle M.D.   On: 09/27/2020 17:06    Assessment/Plan: 1) Scrotal edema:  This appears to be related to regional edema from leg/inguinal infection and edema.  Based on exam and ultrasound, there is no evidence to suggest abscess and no evidence of scrotal skin infection.  Continue to monitor/observe.  Would continue Foley as long as it is difficult for patient to ambulate or use urinal.  Ok to d/c once able to at least use a urinal readily.   LOS: 15 days   Crecencio Mc 09/28/2020, 11:40 AM

## 2020-09-28 NOTE — Progress Notes (Signed)
PROGRESS NOTE    [name]  SAY:[id number] DOB: 1985-11-03 DOA: 09/13/2020 PCP: [name], PA   Brief Narrative:  The patient is a 35 year old male with past medical history of AVN of the hip, hypertension, GERD he was recently admitted on 08/22/2020 for COVID-19 and was discharged on 08/29/2020. 2 days after discharge patient began to feel extremely weak and tired with symptoms progressing over next 2 weeks. Patient developed left thigh rash and leg pain with swelling and tenderness to touch. Patient was seen at PCP office and was told to go to the ED immediately. He was found to have left iliopsoas and left gluteal abscess with diffuse swelling and left hip effusion on CT scan. General surgery was consulted. Blood cultures positive for MSSA bacteremia. He underwent drainage of the abscess with 3 drain placement by IR in his iliopsoas muscle and multiple abscesses in the legs. On 09/15/2020 had left hip interposition arthroplasty with antibiotic spacer placement and debridement and developed decompensated septic shock with refractory hypotension despite 2 units PRBC, 2.2 L of crystalloid and 500 cc of albumin. He was admitted to ICU with vasopressor support. ID has been consulted and recommend 6 weeks of IV cefazolin.Patient also had scrotal edema anasarca in the setting of IV fluid resuscitation. Ultrasound was unremarkable for abscess but showed marked asymmetric left scrotal wall thickening and fluid with increased blood flow consistent with soft tissue infection.  Patient had multiple incision and drainage done by general surgery and also multiple aspiration of the abscesses with drain placement per IR.  He continues to need abscess drainage and wound VAC placement as per general surgery, Ortho and IR.  Assessment & Plan:   Principal Problem:   MSSA bacteremia Active Problems:   Alcohol dependence ([place])   Alcoholic hepatitis   Sepsis ([place])   Septic arthritis ([place])    Iliopsoas abscess on left ([place])  Iliopsoas and left gluteal abscesses associated with MSSA Bacteremia and Groin Abscess Septic shock secondary to above and left hip septic arthritis and MSSA bacteremia Left hip effusion and septic arthritis in the setting of avascular necrosis -Admitted to Inpatient Telemetry  -Initially started on IV vanc, cefepime; pharmacy to dose but changed to IV Cefazolin by ID -Repeat Blood Cx x2 and obtain ECHO; See below -Initial Blood Cx 09/13/20 showed Staph Aureus in Both sets of Cx's -Fluids had initially stopped but then he received 2.2 crystalloid boluses as well as 2 units of PRBCs postoperatively -Sepsis Criteria met with: tachycardia, tachypnea, lactic acid 3.3, and Leukocytosis was 10.6 source: abscesses as above as well as bacteremia and went into septic shock during this admission -Procalcitonin is improved -WBC remains elevated but is relatively stable at 14.0 -Lactic had up to 5.3 and then trended down -General surgery recs IR involvement for possible abscess drainage and for ortho to review (CCS consulted IR, EDP has consulted ortho); appreciate assistance -Interventional radiology 3 drains in place today via CT-guided placement with a 14 French drainage catheter into the left retroperitoneal abscess into 12 French drains in the medial and lateral abscesses with the anterior aspect of left thigh and there is a total of 1.3 L of purulent fluid that was aspirated following drain placement sent for evaluation -Interventional Radiology evaluated the drains and they are recommending continue drain management and continue every shift flushes and monitoring of output; he is going to go to the OR today and if the drains are remain and the following plan for this is to repeat a  CT scan when his drain output is less than 10 cc/day to assess for possible removal however repeat CT was done below -Drain Gram Stain showing Moderate WBC, Abundant Gram Positive Cocci, and  Abundant Gram Negative Rods with Cultures showing abundant Staph Aureus which is pan sensitive -Infectious disease is now recommending a prolonged 6-week course of IV cefazolin and he will need oral medications after for extension and they recommend that is okay to place a PICC but they are recommending to wait there is ongoing abscess and drainage could also consider rifampin at some point. -Infectious diseases consulted and recommending changing antibiotic to cefazolin and following repeat echocardiogram; ECHO as below did not mention any vegetation; infectious diseases is now recommending the addition of Flagyl -Repeat blood cultures have been done and show NGTD at 5 Days; since if TTE is negative they can recommend a transesophageal echocardiogram then but since his TTE was a difficult Study, ID Dr. Juleen China recommending TEE given Disseminated MSSA infection so he underwent a transesophageal echocardiogram on Monday, 09/19/2020. -Patient underwent a repeat CT scan of the abdomen pelvis as well as the femur and showed " Interval placement of left pelvic and two left thigh drains for multiple abscesses. The pelvic components of the fluid collections have mildly improved, but there are multiple persistent large peripherally enhancing  complex fluid collections within the left thigh which are incompletely drained. These are quite extensive in length, extending into the distal thigh. The proximal component of the anteromedial collection has  slightly enlarged. No evidence of osteomyelitis or large hip joint effusion. 3. Interval left total hip arthroplasty. The hardware appears well positioned." -Because of this orthopedic surgery reviewed the CT scan and I feel the best course of action is to take the patient for an open irrigation debridement of the medial and lateral thigh to express these fluid collections he went back to the OR on 09/21/2020 as well as 09/27/2020 neurosurgery was also consulted given that  infection had spread into the left groin -LE Venous Duplex repeated and showed no DVT but portions were limited  -Continue PT OT evaluation and continue dressing changes per orthopedic surgery and VT prophylaxis further recommendations but currently this is held given his anemia and thrombocytopenia -PT/OT recommending CIR but once he is only cleared from a medical perspective and once orthopedic surgery signed off the case and has no further debridement plans  Chronic Pain -pain is not controlled, will start MS Contin 15 mg p.o. twice daily, discontinue oxycodone.   -We will continue with Dilaudid 1 mg IV every 4 hours as needed for breakthrough pain  Symptomatic Anemia Macrocytic Anemia -Presented with Hgb/Hct of 6.2/18.9 -Patient is status post 8 units of PRBCs total  -The patient's hemoglobin/hematocrit is now 7.7/24.5 -FOBT negative -Anemia panel done and showed an iron level of 44, U IBC 131, TIBC 175, saturation ratios of 25% -check LDH and was 308, haptoglobin pending, iron studies showed an iron level of 44, U IBC 131, TIBC 175, saturation ratios of 25% -Continue to monitor for signs and symptoms of bleeding; currently no overt bleeding noted  -repeat CBC in a.m.  Hx of Alcoholic Hepatitis and alcohol abuse Hepatic Cirrhosis Hyperbilirubinemia, was improving and now worsening  Mildly abnormal AST -Was continiung Prednisolone but this was discontinued due to disseminated MSSA bacteremia. -LFTs have normalized and so has his T bili -Patient and family report he has been alcohol free for 6 weeks -Continue lactulose 10 g p.o. twice daily, thiamine, magnesium -Continue  monitor hepatic function daily   Hx of Tachycardia; Now in Sinus Tachycardia intermittently HTN  -Baseline heart rate is anywhere between 100-110 per family -Continue Carvedilol 3.125 mg p.o. twice daily (held due to hypotension but now resumed) and will increase the dose to 6.2 5 in the AM and will continue for  now  GAD -Continue sertraline 25 mg p.o. nightly  GERD -C/w Pantoprazole 40 mg p.o. twice daily  Hyponatremia -Initiated on IV fluid 21-septic shock but now this to stop and he was given diuresis -Sodium is now 130 -May need some more Lasix -Continue to monitor and trend and repeat CMP in a.m.  Metabolic Acidosis -Mild and is now improved -Patient CO2 is now  21, anion gap is  9, chloride level is 100 -IV fluid hydration stop -Continue to monitor and trend and repeat CMP in a.m.  Hypophosphatemia -Patient's phosphorus level is now 2.5 -Continue to monitor and replete as necessary -Repeat CBC in a.m.  Scrotal Edema and Anasarca, persistent -Patient is positive + 4.783 L since admission -In the setting of his hepatic cirrhosis and IV fluid resuscitation as well as his infection -Stopped IV fluids and recommend scrotal elevation and sling and obtain a scrotal ultrasound and Doppler to rule out any pathology -Initial scrotal U/S done and showed "No evidence of testicular mass or torsion. Marked asymmetric left scrotal wall thickening and fluid, with increased blood flow. This is likely due to soft tissue infection, and part of the much more widespread process in the left pelvis and thigh demonstrated on prior CT." -Foley catheter has been removed but now has been placed back in -Repeat ultrasound done yesterday was concerning for soft tissue infection but urology evaluated and appreciate Dr. Lynne Logan evaluation and feels that this appears to be related to the regional edema from his leg and inguinal infection as well as edema.  There is no abscess on recent ultrasound and no evidence of scrotal skin infection and they recommended continue to monitor observe and recommending continuing for a long is a difficult for the patient ambulate for using urinal -Continue with Extremity Elevation   Thrombocytopenia  -Patient's platelet count has now normalized and is 176,000 -Continue to  monitor for signs and symptoms of bleeding; currently no overt bleeding noted -Repeat CBC in a.m.  DVT prophylaxis: SCDs Code Status: FULL CODE Family Communication: Discussed with family at bedside  Disposition Plan: Pending further clinical improvement   Status is: Inpatient  Remains inpatient appropriate because:Unsafe d/c plan, IV treatments appropriate due to intensity of illness or inability to take PO and Inpatient level of care appropriate due to severity of illness   Dispo: The patient is from: Home              Anticipated d/c is to: CIR              Patient currently is not medically stable to d/c.   Difficult to place patient No  Consultants:   General Surgery  Urology  Orthopedic Surgery  Interventional Radiology  Infectious Diseases    Procedures:  TOTAL HIP ARTHROPLASTY WITH CEMENT INTERPOSITION SPACINGfor septic hip arthritis from septicemia in the setting of alcoholic AVN of the left hip  Drainage of the abscess with 3 drain placement by IR in iliopsoas muscle and multiple abscesses in the legs  PROCEDURE by Dr. Mardelle Matte on 09/27/20: Left thigh irrigation and debridement, excisional debridement, skin, subcutaneous tissue, muscle with partial closure  Procedure: Incision and drainage of left groin abscess  and left thigh abscess done by Dr. Romana Juniper on 09/27/20   Antimicrobials:  Anti-infectives (From admission, onward)   Start     Dose/Rate Route Frequency Ordered Stop   09/26/20 1300  rifampin (RIFADIN) 300 mg in sodium chloride 0.9 % 100 mL IVPB        300 mg 200 mL/hr over 30 Minutes Intravenous Every 12 hours 09/26/20 1156     09/18/20 1400  metroNIDAZOLE (FLAGYL) tablet 500 mg        500 mg Oral Every 8 hours 09/18/20 0927     09/15/20 1715  tobramycin (NEBCIN) powder  Status:  Discontinued          As needed 09/15/20 1715 09/15/20 2043   09/15/20 1715  vancomycin (VANCOCIN) powder  Status:  Discontinued          As needed 09/15/20 1716  09/15/20 2043   09/14/20 0600  ceFAZolin (ANCEF) IVPB 2g/100 mL premix        2 g 200 mL/hr over 30 Minutes Intravenous Every 8 hours 09/14/20 0359     09/13/20 2200  vancomycin (VANCOREADY) IVPB 1500 mg/300 mL  Status:  Discontinued        1,500 mg 150 mL/hr over 120 Minutes Intravenous Every 12 hours 09/13/20 1636 09/14/20 0359   09/13/20 1500  ceFEPIme (MAXIPIME) 2 g in sodium chloride 0.9 % 100 mL IVPB  Status:  Discontinued        2 g 200 mL/hr over 30 Minutes Intravenous Every 8 hours 09/13/20 1437 09/14/20 0359   09/13/20 1130  vancomycin (VANCOREADY) IVPB 1750 mg/350 mL        1,750 mg 175 mL/hr over 120 Minutes Intravenous  Once 09/13/20 1107 09/13/20 1414   09/13/20 1045  ceFAZolin (ANCEF) IVPB 1 g/50 mL premix        1 g 100 mL/hr over 30 Minutes Intravenous  Once 09/13/20 1031 09/13/20 1307       Subjective: Seen and examined at bedside and he was about to get his dressings changed.  Continues to complain of some pain.  No nausea or vomiting.  Family thinks he got more mobility than he did last week.  No other concerns or complaints at this time.  Objective: Vitals:   09/28/20 0545 09/28/20 0931 09/28/20 1332 09/28/20 1742  BP: 122/81 125/84 124/81 125/81  Pulse: (!) 108 (!) 127 (!) 127 (!) 136  Resp: 17 14 16 15   Temp: 98.9 F (37.2 C) 98 F (36.7 C) 98.8 F (37.1 C)   TempSrc: Oral Oral Oral   SpO2: 99% 96% 95% 93%  Weight: 93.2 kg     Height:        Intake/Output Summary (Last 24 hours) at 09/28/2020 1759 Last data filed at 09/28/2020 1400 Gross per 24 hour  Intake 765.15 ml  Output 1012 ml  Net -246.85 ml   Filed Weights   09/19/20 0843 09/21/20 0500 09/28/20 0545  Weight: 97.8 kg 93.9 kg 93.2 kg   Examination: Physical Exam:  Constitutional: WN/WD overweight Caucasian male currently in no acute distress appears calm but does appear uncomfortable Eyes: Lids and conjunctivae normal, sclerae anicteric  ENMT: External Ears, Nose appear normal. Grossly  normal hearing. Neck: Appears normal, supple, no cervical masses, normal ROM, no appreciable thyromegaly; no JVD Respiratory: Diminished to auscultation bilaterally, no wheezing, rales, rhonchi or crackles. Normal respiratory effort and patient is not tachypenic. No accessory muscle use.  Cardiovascular: RRR, no murmurs / rubs / gallops. S1 and  S2 auscultated.  Is 1+ lower extremity edema Abdomen: Soft, non-tender, distended secondary by habitus. Bowel sounds positive.  GU: Deferred.  Foley catheter is in place Musculoskeletal: No clubbing / cyanosis of digits/nails. No joint deformity upper and lower extremities.  Skin: No rashes, lesions, ulcers on limited skin evaluation had multiple incisions in his hip that appear clean dry intact and dressings were not removed to view his postoperative wound. No induration; Warm and dry.  Neurologic: CN 2-12 grossly intact with no focal deficits. Romberg sign and cerebellar reflexes not assessed.  Psychiatric: Normal judgment and insight. Alert and oriented x 3. Normal mood and appropriate affect.   Data Reviewed: I have personally reviewed following labs and imaging studies  CBC: Recent Labs  Lab 09/24/20 0912 09/25/20 0247 09/26/20 0502 09/27/20 0512 09/28/20 0442  WBC 18.5* 15.1* 14.8* 13.6* 14.0*  HGB 7.2* 7.5* 7.1* 8.1* 7.7*  HCT 21.9* 23.3* 22.0* 25.1* 24.5*  MCV 101.9* 102.2* 102.8* 100.4* 104.3*  PLT 145* 155 159 169 161   Basic Metabolic Panel: Recent Labs  Lab 09/24/20 0912 09/25/20 0247 09/26/20 0502 09/27/20 0512 09/28/20 0442  NA 125* 127* 127* 129* 130*  K 4.7 5.3* 4.5 4.2 4.1  CL 94* 96* 98 100 100  CO2 24 25 23 25  21*  GLUCOSE 112* 92 91 89 130*  BUN 12 11 10 12 14   CREATININE 0.41* 0.44* 0.36* 0.49* 0.41*  CALCIUM 7.4* 7.6* 7.3* 7.4* 7.6*  MG  --   --   --   --  1.7  PHOS  --   --   --   --  2.5   GFR: Estimated Creatinine Clearance: 146.4 mL/min (A) (by C-G formula based on SCr of 0.41 mg/dL (L)). Liver Function  Tests: Recent Labs  Lab 09/23/20 0243 09/24/20 0912 09/25/20 0247 09/26/20 0502 09/28/20 0442  AST 31 32 31 28 28   ALT 9 9 8 8 8   ALKPHOS 211* 203* 198* 195* 235*  BILITOT 1.0 0.9 1.1 1.0 0.9  PROT 5.2* 5.0* 5.2* 5.0* 5.3*  ALBUMIN 1.9* 1.9* 1.8* 1.8* 1.9*   No results for input(s): LIPASE, AMYLASE in the last 168 hours. No results for input(s): AMMONIA in the last 168 hours. Coagulation Profile: No results for input(s): INR, PROTIME in the last 168 hours. Cardiac Enzymes: No results for input(s): CKTOTAL, CKMB, CKMBINDEX, TROPONINI in the last 168 hours. BNP (last 3 results) No results for input(s): PROBNP in the last 8760 hours. HbA1C: No results for input(s): HGBA1C in the last 72 hours. CBG: No results for input(s): GLUCAP in the last 168 hours. Lipid Profile: No results for input(s): CHOL, HDL, LDLCALC, TRIG, CHOLHDL, LDLDIRECT in the last 72 hours. Thyroid Function Tests: No results for input(s): TSH, T4TOTAL, FREET4, T3FREE, THYROIDAB in the last 72 hours. Anemia Panel: No results for input(s): VITAMINB12, FOLATE, FERRITIN, TIBC, IRON, RETICCTPCT in the last 72 hours. Sepsis Labs: No results for input(s): PROCALCITON, LATICACIDVEN in the last 168 hours.  No results found for this or any previous visit (from the past 240 hour(s)).   RN Pressure Injury Documentation:     Estimated body mass index is 29.48 kg/m as calculated from the following:   Height as of this encounter: 5' 10"  (1.778 m).   Weight as of this encounter: 93.2 kg.  Malnutrition Type:  Nutrition Problem: Increased nutrient needs Etiology: chronic illness   Malnutrition Characteristics:  Signs/Symptoms: estimated needs   Nutrition Interventions:  Interventions: [place]     Radiology Studies:  US SCROTUM  Result Date: 09/27/2020 CLINICAL DATA:  Swelling.  Drain placement in left thigh on 03/04. EXAM: SCROTAL ULTRASOUND DOPPLER ULTRASOUND OF THE TESTICLES TECHNIQUE:  Complete ultrasound examination of the testicles, epididymis, and other scrotal structures was performed. Color and spectral Doppler ultrasound were also utilized to evaluate blood flow to the testicles. COMPARISON:  Ultrasound dated 09/17/2020 FINDINGS: Right testicle Measurements: 3 x 2.1 x 2.3 cm. No mass or microlithiasis visualized. Left testicle Measurements: 3.5 x 2.1 x 1.8 cm. No mass or microlithiasis visualized. Right epididymis:  Normal in size and appearance. Left epididymis:  Normal in size and appearance. Hydrocele: There are bilateral relatively small hydroceles, right greater than left. Varicocele:  None visualized. Pulsed Doppler interrogation of both testes demonstrates normal low resistance arterial and venous waveforms bilaterally. There is severe diffuse scrotal wall thickening, greatest on the left. IMPRESSION: 1. No testicular mass. 2. No testicular torsion. 3. Diffuse scrotal wall thickening, greatest on the left. Findings are concerning for an underlying soft tissue infection. The soft tissues are edematous without a clear drainable fluid collection. Electronically Signed   By: Constance Holster M.D.   On: 09/27/2020 17:06   VAS [place] LOWER EXTREMITY VENOUS (DVT)  Result Date: 09/28/2020  Lower Venous DVT Study Indications: Possible injury to blood vessels per order in chart.  Limitations: Bandages and poor ultrasound/tissue interface. Comparison Study: no previous exams Performing Technologist: Rogelia Rohrer  Examination Guidelines: A complete evaluation includes B-mode imaging, spectral Doppler, color Doppler, and power Doppler as needed of all accessible portions of each vessel. Bilateral testing is considered an integral part of a complete examination. Limited examinations for reoccurring indications may be performed as noted. The reflux portion of the exam is performed with the patient in reverse Trendelenburg.  +-----+---------------+---------+-----------+----------+--------------+  RIGHTCompressibilityPhasicitySpontaneityPropertiesThrombus Aging +-----+---------------+---------+-----------+----------+--------------+ CFV  Full           Yes      Yes                                 +-----+---------------+---------+-----------+----------+--------------+   +---------+---------------+---------+-----------+----------+--------------+ LEFT     CompressibilityPhasicitySpontaneityPropertiesThrombus Aging +---------+---------------+---------+-----------+----------+--------------+ FV DistalFull           Yes      Yes                                 +---------+---------------+---------+-----------+----------+--------------+ POP      Full           Yes      Yes                                 +---------+---------------+---------+-----------+----------+--------------+ PTV      Full                                                        +---------+---------------+---------+-----------+----------+--------------+ PERO     Full                                                        +---------+---------------+---------+-----------+----------+--------------+  Left Technical Findings: Not visualized segments include CFV, PFV, Prox & Mid FV, SFJ. Patient just underwent I & D of large left thigh abscess yesterday. Bandaged from groin to distal thigh - unable to remove. Limited study.   Summary: RIGHT: - No evidence of common femoral vein obstruction.  LEFT: - There is no evidence of deep vein thrombosis in the lower extremity. However, portions of this examination were limited- see technologist comments above.  - No cystic structure found in the popliteal fossa.  *See table(s) above for measurements and observations.    Preliminary    Scheduled Meds: . (feeding supplement) PROSource Plus  30 mL Oral BID BM  . carvedilol  6.25 mg Oral BID WC  . Chlorhexidine Gluconate Cloth  6 each Topical Daily  . docusate sodium  100 mg Oral BID  . feeding supplement  1  Container Oral BID BM  . folic acid  1 mg Oral Daily  . lactulose  10 g Oral BID  . mouth rinse  15 mL Mouth Rinse BID  . metroNIDAZOLE  500 mg Oral Q8H  . morphine  15 mg Oral Q12H  . multivitamin with minerals  1 tablet Oral Daily  . nutrition supplement (JUVEN)  1 packet Oral BID BM  . pantoprazole  40 mg Oral BID  . senna  1 tablet Oral BID  . sertraline  25 mg Oral Daily  . sodium chloride flush  5 mL Intracatheter Q8H  . sodium chloride flush  5 mL Intracatheter Q8H  . thiamine  100 mg Oral Daily   Continuous Infusions: .  ceFAZolin (ANCEF) IV 2 g (09/28/20 1151)  . rifampin (RIFADIN) IVPB 300 mg (09/28/20 1551)    LOS: 15 days    Kerney Elbe, DO [facility] PAGER is on [place]  If 7PM-7AM, please contact night-coverage [email or web address]

## 2020-09-28 NOTE — Progress Notes (Signed)
Inpatient Rehab Admissions Coordinator:    Pt. Is not medically ready for CIR at this time. I will continue to follow for updated therapy notes post surgery 3/8.  Megan Salon, MS, CCC-SLP Rehab Admissions Coordinator  [phone] (celll) [phone] (office)

## 2020-09-28 NOTE — Progress Notes (Addendum)
1 Day Post-Op    CC: Fatigue, left leg pain  Subjective: NAEO. Ongoing left hip/groin pain worse with movement. Increasing abdominal distention due to ascites. Tolerating PO.   Objective: Vital signs in last 24 hours: Temp:  [97.7 F (36.5 C)-99.4 F (37.4 C)] 98.9 F (37.2 C) (03/09 0545) Pulse Rate:  [94-113] 108 (03/09 0545) Resp:  [13-20] 17 (03/09 0545) BP: (111-128)/(74-93) 122/81 (03/09 0545) SpO2:  [94 %-100 %] 99 % (03/09 0545) Weight:  [93.2 kg] 93.2 kg (03/09 0545) Last BM Date: 09/26/20 240 p.o. 500 IV 1150 urine 112 drain Afebrile, vital signs are stable Sodium 130, glucose 130, creatinine 0.41, alk phos 235, albumin 1.9, WBC 14.0 H/H 7.7/24.5 Platelets 176,000 Scrotal ultrasound obtained yesterday: No testicular mass or testicular torsion, diffuse scrotal wall thickening greatest on the left findings concerning for underlying soft tissue infection soft tissues are edematous without clear drainable fluid collection.  Intake/Output from previous day: 03/08 0701 - 03/09 0700 In: 1165.2 [P.O.:240; I.V.:500; IV Piggyback:395.2] Out: 1312 [Urine:1150; Drains:112; Blood:50] Intake/Output this shift: No intake/output data recorded.  General: white male, laying in bed, NAD HEENT: head -normocephalic, atraumatic Pulm- breathing is non-labored Abd- soft, moderate distention,  MSK- UE symmetrical lower extremity edema is present L>R Left thigh/groin - I took down his L thigh dressing during my exam, removed packing. All 3 wounds communicate with each other and should be packed accordingly  L distal thigh wound: partially closed and distal end. Proximal aspect of wound with viable, beefy red muscle. No purulent. Wound tracks/tunnels in two directions to communicate with below more proximal and medial incisions of the thigh/groin.   L superior groin wound - roughly 7x2x2 cm, wound base >50% granulation tissue, some fibrinous exudate, no purulence.  L inferior groin wound  - roughly 4x3x2 cm with circumferential undermining. Small amt purulence in inferior aspect of wound. No undrained pocked of purulence appreciated. Cellulitis improving. GU: ongoing scrotal edema with blanching erythema of the scrotum  Neuro- CN II-XII grossly in tact, no paresthesias. Psych- Alert and Oriented x3 with appropriate affect Skin: warm and dry, no rashes or lesions  Lab Results:  Recent Labs    09/27/20 0512 09/28/20 0442  WBC 13.6* 14.0*  HGB 8.1* 7.7*  HCT 25.1* 24.5*  PLT 169 176    BMET Recent Labs    09/27/20 0512 09/28/20 0442  NA 129* 130*  K 4.2 4.1  CL 100 100  CO2 25 21*  GLUCOSE 89 130*  BUN 12 14  CREATININE 0.49* 0.41*  CALCIUM 7.4* 7.6*   PT/INR No results for input(s): LABPROT, INR in the last 72 hours.  Recent Labs  Lab 09/23/20 0243 09/24/20 0912 09/25/20 0247 09/26/20 0502 09/28/20 0442  AST 31 32 _0 ALT _1 ALKPHOS 211* 203* 198* 195* 235*  BILITOT 1.0 0.9 1.1 1.0 0.9  PROT 5.2* 5.0* 5.2* 5.0* 5.3*  ALBUMIN 1.9* 1.9* 1.8* 1.8* 1.9*     Lipase     Component Value Date/Time   LIPASE 99 (H) 08/23/2020 1500     Medications: . (feeding supplement) PROSource Plus  30 mL Oral BID BM  . carvedilol  6.25 mg Oral BID WC  . Chlorhexidine Gluconate Cloth  6 each Topical Daily  . docusate sodium  100 mg Oral BID  . feeding supplement  1 Container Oral BID BM  . folic acid  1 mg Oral Daily  . lactulose  10 g Oral BID  .  mouth rinse  15 mL Mouth Rinse BID  . metroNIDAZOLE  500 mg Oral Q8H  . morphine  15 mg Oral Q12H  . multivitamin with minerals  1 tablet Oral Daily  . nutrition supplement (JUVEN)  1 packet Oral BID BM  . pantoprazole  40 mg Oral BID  . senna  1 tablet Oral BID  . sertraline  25 mg Oral Daily  . sodium chloride flush  5 mL Intracatheter Q8H  . sodium chloride flush  5 mL Intracatheter Q8H  . 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 Jerrye Bushy 09/20/20 Left thigh irrigation and debridement with wound vac placement, Dr. Mardelle Matte Incision and drainage of left groin abscess, wound VAC change left thigh 09/22/2020, Dr. Erroll Luna, POD #6 I&D of left groin abscess and thigh abscess 09/27/2020, Dr. Jens Som, POD #1 - No acute surgical needs. I will change dressing again tomorrow AM before having RN take over BID dressing changes. - continue Abx per ID - noted urology plans for ongoing monitoring of scrotal edema, appreciate them evaluating   FEN -regular diet VTE -none currently ID -flagyl, ancef   LOS: 13 days    LOS: 15 days   Obie Dredge, PA-C 09/28/2020 Please see Amion

## 2020-09-29 DIAGNOSIS — K6812 Psoas muscle abscess: Secondary | ICD-10-CM | POA: Diagnosis not present

## 2020-09-29 DIAGNOSIS — B9561 Methicillin susceptible Staphylococcus aureus infection as the cause of diseases classified elsewhere: Secondary | ICD-10-CM | POA: Diagnosis not present

## 2020-09-29 DIAGNOSIS — F1029 Alcohol dependence with unspecified alcohol-induced disorder: Secondary | ICD-10-CM | POA: Diagnosis not present

## 2020-09-29 DIAGNOSIS — K7031 Alcoholic cirrhosis of liver with ascites: Secondary | ICD-10-CM | POA: Diagnosis not present

## 2020-09-29 DIAGNOSIS — L02416 Cutaneous abscess of left lower limb: Secondary | ICD-10-CM | POA: Diagnosis not present

## 2020-09-29 DIAGNOSIS — K7010 Alcoholic hepatitis without ascites: Secondary | ICD-10-CM | POA: Diagnosis not present

## 2020-09-29 DIAGNOSIS — R7881 Bacteremia: Secondary | ICD-10-CM | POA: Diagnosis not present

## 2020-09-29 DIAGNOSIS — K7011 Alcoholic hepatitis with ascites: Secondary | ICD-10-CM | POA: Diagnosis not present

## 2020-09-29 LAB — COMPREHENSIVE METABOLIC PANEL
ALT: 7 U/L (ref 0–44)
AST: 28 U/L (ref 15–41)
Albumin: 1.8 g/dL — ABNORMAL LOW (ref 3.5–5.0)
Alkaline Phosphatase: 189 U/L — ABNORMAL HIGH (ref 38–126)
Anion gap: 9 (ref 5–15)
BUN: 14 mg/dL (ref 6–20)
CO2: 20 mmol/L — ABNORMAL LOW (ref 22–32)
Calcium: 7.8 mg/dL — ABNORMAL LOW (ref 8.9–10.3)
Chloride: 100 mmol/L (ref 98–111)
Creatinine, Ser: 0.33 mg/dL — ABNORMAL LOW (ref 0.61–1.24)
GFR, Estimated: >60 mL/min (ref >60)
Glucose, Bld: 88 mg/dL (ref 70–99)
Potassium: 4.4 mmol/L (ref 3.5–5.1)
Sodium: 129 mmol/L — ABNORMAL LOW (ref 135–145)
Total Bilirubin: 1 mg/dL (ref 0.3–1.2)
Total Protein: 5.3 g/dL — ABNORMAL LOW (ref 6.5–8.1)

## 2020-09-29 LAB — CBC WITH DIFFERENTIAL/PLATELET
Abs Immature Granulocytes: 0.38 10*3/uL — ABNORMAL HIGH (ref 0.00–0.07)
Basophils Absolute: 0.1 10*3/uL (ref 0.0–0.1)
Basophils Relative: 0 %
Eosinophils Absolute: 0 10*3/uL (ref 0.0–0.5)
Eosinophils Relative: 0 %
HCT: 26.1 % — ABNORMAL LOW (ref 39.0–52.0)
Hemoglobin: 8.2 g/dL — ABNORMAL LOW (ref 13.0–17.0)
Immature Granulocytes: 2 %
Lymphocytes Relative: 19 %
Lymphs Abs: 3 10*3/uL (ref 0.7–4.0)
MCH: 32.4 pg (ref 26.0–34.0)
MCHC: 31.4 g/dL (ref 30.0–36.0)
MCV: 103.2 fL — ABNORMAL HIGH (ref 80.0–100.0)
Monocytes Absolute: 1 10*3/uL (ref 0.1–1.0)
Monocytes Relative: 6 %
Neutro Abs: 11.2 10*3/uL — ABNORMAL HIGH (ref 1.7–7.7)
Neutrophils Relative %: 73 %
Platelets: 183 10*3/uL (ref 150–400)
RBC: 2.53 MIL/uL — ABNORMAL LOW (ref 4.22–5.81)
RDW: 18.8 % — ABNORMAL HIGH (ref 11.5–15.5)
WBC: 15.6 10*3/uL — ABNORMAL HIGH (ref 4.0–10.5)
nRBC: 0 % (ref 0.0–0.2)

## 2020-09-29 LAB — PROTIME-INR
INR: 1.4 — ABNORMAL HIGH (ref 0.8–1.2)
Prothrombin Time: 16.7 seconds — ABNORMAL HIGH (ref 11.4–15.2)

## 2020-09-29 LAB — PHOSPHORUS: Phosphorus: 3.3 mg/dL (ref 2.5–4.6)

## 2020-09-29 LAB — MAGNESIUM: Magnesium: 1.6 mg/dL — ABNORMAL LOW (ref 1.7–2.4)

## 2020-09-29 MED ORDER — FUROSEMIDE 10 MG/ML IJ SOLN
40.0000 mg | Freq: Once | INTRAMUSCULAR | Status: AC
  Administered 2020-09-29: 10:00:00 40 mg via INTRAVENOUS
  Filled 2020-09-29: qty 4

## 2020-09-29 MED ORDER — FUROSEMIDE 40 MG PO TABS
40.0000 mg | ORAL_TABLET | Freq: Every day | ORAL | Status: DC
  Administered 2020-09-29 – 2020-10-05 (×7): 40 mg via ORAL
  Filled 2020-09-29 (×7): qty 1

## 2020-09-29 MED ORDER — SPIRONOLACTONE 100 MG PO TABS
100.0000 mg | ORAL_TABLET | Freq: Every day | ORAL | Status: DC
  Administered 2020-09-29 – 2020-10-10 (×12): 100 mg via ORAL
  Filled 2020-09-29 (×12): qty 1

## 2020-09-29 MED ORDER — MAGNESIUM SULFATE 2 GM/50ML IV SOLN
2.0000 g | Freq: Once | INTRAVENOUS | Status: AC
  Administered 2020-09-29: 2 g via INTRAVENOUS
  Filled 2020-09-29: qty 50

## 2020-09-29 MED ORDER — RIFAMPIN 300 MG PO CAPS
300.0000 mg | ORAL_CAPSULE | Freq: Two times a day (BID) | ORAL | Status: DC
  Administered 2020-09-29 – 2020-10-10 (×21): 300 mg via ORAL
  Filled 2020-09-29 (×23): qty 1

## 2020-09-29 MED ORDER — ALBUMIN HUMAN 25 % IV SOLN
50.0000 g | Freq: Once | INTRAVENOUS | Status: AC
  Administered 2020-09-30: 50 g via INTRAVENOUS
  Filled 2020-09-29: qty 200

## 2020-09-29 NOTE — Progress Notes (Signed)
Patient ID: [name], male   DOB: June 01, 1986, 35 y.o.   MRN: [id number] Pt asleep; afebrile; BP ok; left thigh/groin drains in place, OP range from 0-35 cc; WBC 15.6(14), hgb 8.2(7.7), creat 0.33; consider f/u CT A/P soon if WBC cont trend upwards/OP declines to assess adequacy of left thigh drainage- last CT done 3/4

## 2020-09-29 NOTE — Consult Note (Signed)
WOC Nurse Consult Note: Reason for Consult:Intertriginous dermatitis to abdominal pannus.  Acute ascites with distended abdomen.  Draining abscesses to left thigh and inguinal fold.  Surgery team and Orthopedics team managing wound care to these areas.  Will implement orders for moisture associated skin damage to skin folds. Wound type:MASD Pressure Injury POA: NA Measurement: pannus:  1 cm x 4 cm x 0.2 cm  Wound bed:red moist Drainage (amount, consistency, odor) moderate drainage from multiple wounds in this area.  Skin folds frequently moist.  Periwound:moisture, edema Dressing procedure/placement/frequency:Cleanse abdominal and inguinal skin folds with soap and water and pat dry.   Interdry [facility] # [phone]) to skin folds:  Measure and cut length of InterDry to fit in skin folds that have skin breakdown Tuck InterDry fabric into skin folds in a single layer, allow for 2 inches of overhang from skin edges to allow for wicking to occur May remove to bathe; dry area thoroughly and then tuck into affected areas again Do not apply any creams or ointments when using InterDry DO NOT THROW AWAY FOR 5 DAYS unless soiled with stool DO NOT [facility] product, this will inactivate the silver in the material  New sheet of Interdry should be applied after 5 days of use if patient continues to have skin breakdown   Will not follow at this time.  Please re-consult if needed.  Maple Hudson MSN, RN, FNP-BC CWON Wound, Ostomy, Continence Nurse Pager [phone]

## 2020-09-29 NOTE — Progress Notes (Addendum)
IP rehab admissions - following along for progress.  Not yet ready for acute inpatient rehab admission.  Call for questions.  [phone]

## 2020-09-29 NOTE — Progress Notes (Signed)
Patient ID: [name], male   DOB: Feb 07, 1986, 35 y.o.   MRN: [id number]  2 Days Post-Op Subjective: No acute events overnight No complaints of specific scrotal pain  Objective: Vital signs in last 24 hours: Temp:  [98.1 F (36.7 C)-98.9 F (37.2 C)] 98.2 F (36.8 C) (03/10 0619) Pulse Rate:  [110-136] 110 (03/10 0619) Resp:  [15-18] 18 (03/10 0619) BP: (115-125)/(72-81) 118/73 (03/10 0619) SpO2:  [93 %-95 %] 94 % (03/10 0619)  Intake/Output from previous day: 03/09 0701 - 03/10 0700 In: 310 [I.V.:10; IV Piggyback:300] Out: 1175 [Urine:1125; Drains:50] Intake/Output this shift: Total I/O In: 240 [P.O.:240] Out: 150 [Urine:150]  Physical Exam:  General: Alert and oriented GU: Diffuse edema involving penile skin and scrotum.  Edema more intense of the patient's left scrotum.  No cellulitis.  Foley in place.  Lab Results: Recent Labs    09/27/20 0512 09/28/20 0442 09/29/20 0502  HGB 8.1* 7.7* 8.2*  HCT 25.1* 24.5* 26.1*   CBC Latest Ref Rng & Units 09/29/2020 09/28/2020 09/27/2020  WBC 4.0 - 10.5 K/uL 15.6(H) 14.0(H) 13.6(H)  Hemoglobin 13.0 - 17.0 g/dL 8.2(L) 7.7(L) 8.1(L)  Hematocrit 39.0 - 52.0 % 26.1(L) 24.5(L) 25.1(L)  Platelets 150 - 400 K/uL 183 176 169     BMET Recent Labs    09/28/20 0442 09/29/20 0502  NA 130* 129*  K 4.1 4.4  CL 100 100  CO2 21* 20*  GLUCOSE 130* 88  BUN 14 14  CREATININE 0.41* 0.33*  CALCIUM 7.6* 7.8*     Studies/Results: US SCROTUM  Result Date: 09/27/2020 CLINICAL DATA:  Swelling.  Drain placement in left thigh on 03/04. EXAM: SCROTAL ULTRASOUND DOPPLER ULTRASOUND OF THE TESTICLES TECHNIQUE: Complete ultrasound examination of the testicles, epididymis, and other scrotal structures was performed. Color and spectral Doppler ultrasound were also utilized to evaluate blood flow to the testicles. COMPARISON:  Ultrasound dated 09/17/2020 FINDINGS: Right testicle Measurements: 3 x 2.1 x 2.3 cm. No mass or microlithiasis  visualized. Left testicle Measurements: 3.5 x 2.1 x 1.8 cm. No mass or microlithiasis visualized. Right epididymis:  Normal in size and appearance. Left epididymis:  Normal in size and appearance. Hydrocele: There are bilateral relatively small hydroceles, right greater than left. Varicocele:  None visualized. Pulsed Doppler interrogation of both testes demonstrates normal low resistance arterial and venous waveforms bilaterally. There is severe diffuse scrotal wall thickening, greatest on the left. IMPRESSION: 1. No testicular mass. 2. No testicular torsion. 3. Diffuse scrotal wall thickening, greatest on the left. Findings are concerning for an underlying soft tissue infection. The soft tissues are edematous without a clear drainable fluid collection. Electronically Signed   By: Katherine Mantle M.D.   On: 09/27/2020 17:06   VAS [place] LOWER EXTREMITY VENOUS (DVT)  Result Date: 09/28/2020  Lower Venous DVT Study Indications: Possible injury to blood vessels per order in chart.  Limitations: Bandages and poor ultrasound/tissue interface. Comparison Study: no previous exams Performing Technologist: Ernestene Mention  Examination Guidelines: A complete evaluation includes B-mode imaging, spectral Doppler, color Doppler, and power Doppler as needed of all accessible portions of each vessel. Bilateral testing is considered an integral part of a complete examination. Limited examinations for reoccurring indications may be performed as noted. The reflux portion of the exam is performed with the patient in reverse Trendelenburg.  +-----+---------------+---------+-----------+----------+--------------+ RIGHTCompressibilityPhasicitySpontaneityPropertiesThrombus Aging +-----+---------------+---------+-----------+----------+--------------+ CFV  Full           Yes      Yes                                 +-----+---------------+---------+-----------+----------+--------------+    +---------+---------------+---------+-----------+----------+--------------+  LEFT     CompressibilityPhasicitySpontaneityPropertiesThrombus Aging +---------+---------------+---------+-----------+----------+--------------+ FV DistalFull           Yes      Yes                                 +---------+---------------+---------+-----------+----------+--------------+ POP      Full           Yes      Yes                                 +---------+---------------+---------+-----------+----------+--------------+ PTV      Full                                                        +---------+---------------+---------+-----------+----------+--------------+ PERO     Full                                                        +---------+---------------+---------+-----------+----------+--------------+   Left Technical Findings: Not visualized segments include CFV, PFV, Prox & Mid FV, SFJ. Patient just underwent I & D of large left thigh abscess yesterday. Bandaged from groin to distal thigh - unable to remove. Limited study.   Summary: RIGHT: - No evidence of common femoral vein obstruction.  LEFT: - There is no evidence of deep vein thrombosis in the lower extremity. However, portions of this examination were limited- see technologist comments above.  - No cystic structure found in the popliteal fossa.  *See table(s) above for measurements and observations. Electronically signed by Coral Else MD on 09/28/2020 at 10:06:38 PM.    Final     Assessment/Plan: 1) Scrotal edema:  Appears to be improving based on exam.  There is no evidence to suggest abscess and no evidence of scrotal skin infection.  Continue to monitor/observe.  Would continue Foley as long as it is difficult for patient to ambulate or use urinal.  Have reiterated importance of scrotal support/elevation.  Ok to d/c once able to at least use a urinal readily.  This will improve as his nutritional status and other infections  improve.    At this point there is no additional urologic interventions necessary.  Please contact us if there are any additional questions.   LOS: 16 days   Crist Fat 09/29/2020, 10:52 AM

## 2020-09-29 NOTE — Progress Notes (Addendum)
    [facility]   Reason for visit: Follow up on MSSA bacteremia and diffuse abscess  Interval History: s/p operative debridement again on 3/8 with left thigh repeat irrigation and left thigh abscess; Wound VAC placed.  WBC 15.6.  Remains afebrile.  Day 17 total antibiotics Day 12 metronidazole Day 17 cefazollin Day 4 rifampin  Physical Exam: Constitutional:  Vitals:   09/29/20 0208 09/29/20 0619  BP: 121/79 118/73  Pulse: (!) 114 (!) 110  Resp: 16 18  Temp: 98.1 F (36.7 C) 98.2 F (36.8 C)  SpO2: 94% 94%   patient appears in NAD Respiratory: Normal respiratory effort; CTA B Cardiovascular: RRR GI: soft, nt, nd  Review of Systems: Constitutional: negative for fevers and chills Gastrointestinal: negative for nausea and diarrhea  Lab Results  Component Value Date   WBC 15.6 (H) 09/29/2020   HGB 8.2 (L) 09/29/2020   HCT 26.1 (L) 09/29/2020   MCV 103.2 (H) 09/29/2020   PLT 183 09/29/2020    Lab Results  Component Value Date   CREATININE 0.33 (L) 09/29/2020   BUN 14 09/29/2020   NA 129 (L) 09/29/2020   K 4.4 09/29/2020   CL 100 09/29/2020   CO2 20 (L) 09/29/2020    Lab Results  Component Value Date   ALT 7 09/29/2020   AST 28 09/29/2020   ALKPHOS 189 (H) 09/29/2020     Microbiology: No results found for this or any previous visit (from the past 240 hour(s)).  Impression/Plan:  1. MSSA bacteremia - blood cultures from 2/23 are negative and on cefazolin.  TEE negative for vegetation but will need a prolonged course of IV cefazolin due to severity of infection and complicating factors including necessity of placement of the left hip total arthroplasty.   Plan for 6 weeks.    2.  Septic arthritis - from #1 and had significant destruction of the left femoral head requiring debridement and joint replacement.  Has a history of AVN of his hip though xray of hip in Jusy 2021 without issues.   On antibiotics as above and have added rifampin  due to hardware.    3.  Liver cirrhosis - noted on CT and history of alcoholic hepatitis.  Hepatitis A, B and C all negative.  Some thrombocytopenia previously though is wnl the last 4 days but on the low end.  Previously referred to GI by his PCP but ended up hospitalized before going.  He will need folllow up again with his PCP for GI referral.    We will follow up again on Monday

## 2020-09-29 NOTE — Progress Notes (Addendum)
Subjective:  Laying in bed. States pain is better controlled this morning. Requesting pain medicine prior to dressing change. Patient has not been able to mobilize with therapy due to pain with movement of left leg.   Objective:  PE: VITALS:   Vitals:   09/28/20 1742 09/28/20 2151 09/29/20 0208 09/29/20 0619  BP: 125/81 115/72 121/79 118/73  Pulse: (!) 136 (!) 115 (!) 114 (!) 110  Resp: 15 18 16 18   Temp:  98.9 F (37.2 C) 98.1 F (36.7 C) 98.2 F (36.8 C)  TempSrc:  Oral Oral   SpO2: 93% 94% 94% 94%  Weight:      Height:       General: oriented, laying in bed Resp: no increased respiratory effort MSK:No left calf swelling, TTP. Sensation intact to all aspects of left foot. Increased edema of left foot. + DP pulse.Dorsiflexion/Plantar flexion intact.Dressing to left hip and left groin in place. Left hip dressing CDI. Left groin dressing with moderate serous drainage. Closed areas from procedures continue to be well approximated.    LABS  Results for orders placed or performed during the hospital encounter of 09/13/20 (from the past 24 hour(s))  CBC with Differential/Platelet     Status: Abnormal   Collection Time: 09/29/20  5:02 AM  Result Value Ref Range   WBC 15.6 (H) 4.0 - 10.5 K/uL   RBC 2.53 (L) 4.22 - 5.81 MIL/uL   Hemoglobin 8.2 (L) 13.0 - 17.0 g/dL   HCT 11/29/20 (L) 47.0 - 96.2 %   MCV 103.2 (H) 80.0 - 100.0 fL   MCH 32.4 26.0 - 34.0 pg   MCHC 31.4 30.0 - 36.0 g/dL   RDW 83.6 (H) 62.9 - 47.6 %   Platelets 183 150 - 400 K/uL   nRBC 0.0 0.0 - 0.2 %   Neutrophils Relative % 73 %   Neutro Abs 11.2 (H) 1.7 - 7.7 K/uL   Lymphocytes Relative 19 %   Lymphs Abs 3.0 0.7 - 4.0 K/uL   Monocytes Relative 6 %   Monocytes Absolute 1.0 0.1 - 1.0 K/uL   Eosinophils Relative 0 %   Eosinophils Absolute 0.0 0.0 - 0.5 K/uL   Basophils Relative 0 %   Basophils Absolute 0.1 0.0 - 0.1 K/uL   Immature Granulocytes 2 %   Abs Immature Granulocytes 0.38 (H) 0.00 - 0.07 K/uL   Comprehensive metabolic panel     Status: Abnormal   Collection Time: 09/29/20  5:02 AM  Result Value Ref Range   Sodium 129 (L) 135 - 145 mmol/L   Potassium 4.4 3.5 - 5.1 mmol/L   Chloride 100 98 - 111 mmol/L   CO2 20 (L) 22 - 32 mmol/L   Glucose, Bld 88 70 - 99 mg/dL   BUN 14 6 - 20 mg/dL   Creatinine, Ser 11/29/20 (L) 0.61 - 1.24 mg/dL   Calcium 7.8 (L) 8.9 - 10.3 mg/dL   Total Protein 5.3 (L) 6.5 - 8.1 g/dL   Albumin 1.8 (L) 3.5 - 5.0 g/dL   AST 28 15 - 41 U/L   ALT 7 0 - 44 U/L   Alkaline Phosphatase 189 (H) 38 - 126 U/L   Total Bilirubin 1.0 0.3 - 1.2 mg/dL   GFR, Estimated 5.03 >54 mL/min   Anion gap 9 5 - 15  Magnesium     Status: Abnormal   Collection Time: 09/29/20  5:02 AM  Result Value Ref Range   Magnesium 1.6 (L) 1.7 - 2.4 mg/dL  Phosphorus     Status: None   Collection Time: 09/29/20  5:02 AM  Result Value Ref Range   Phosphorus 3.3 2.5 - 4.6 mg/dL    US SCROTUM  Result Date: 09/27/2020 CLINICAL DATA:  Swelling.  Drain placement in left thigh on 03/04. EXAM: SCROTAL ULTRASOUND DOPPLER ULTRASOUND OF THE TESTICLES TECHNIQUE: Complete ultrasound examination of the testicles, epididymis, and other scrotal structures was performed. Color and spectral Doppler ultrasound were also utilized to evaluate blood flow to the testicles. COMPARISON:  Ultrasound dated 09/17/2020 FINDINGS: Right testicle Measurements: 3 x 2.1 x 2.3 cm. No mass or microlithiasis visualized. Left testicle Measurements: 3.5 x 2.1 x 1.8 cm. No mass or microlithiasis visualized. Right epididymis:  Normal in size and appearance. Left epididymis:  Normal in size and appearance. Hydrocele: There are bilateral relatively small hydroceles, right greater than left. Varicocele:  None visualized. Pulsed Doppler interrogation of both testes demonstrates normal low resistance arterial and venous waveforms bilaterally. There is severe diffuse scrotal wall thickening, greatest on the left. IMPRESSION: 1. No testicular mass.  2. No testicular torsion. 3. Diffuse scrotal wall thickening, greatest on the left. Findings are concerning for an underlying soft tissue infection. The soft tissues are edematous without a clear drainable fluid collection. Electronically Signed   By: Katherine Mantle M.D.   On: 09/27/2020 17:06   VAS [place] LOWER EXTREMITY VENOUS (DVT)  Result Date: 09/28/2020  Lower Venous DVT Study Indications: Possible injury to blood vessels per order in chart.  Limitations: Bandages and poor ultrasound/tissue interface. Comparison Study: no previous exams Performing Technologist: Ernestene Mention  Examination Guidelines: A complete evaluation includes B-mode imaging, spectral Doppler, color Doppler, and power Doppler as needed of all accessible portions of each vessel. Bilateral testing is considered an integral part of a complete examination. Limited examinations for reoccurring indications may be performed as noted. The reflux portion of the exam is performed with the patient in reverse Trendelenburg.  +-----+---------------+---------+-----------+----------+--------------+ RIGHTCompressibilityPhasicitySpontaneityPropertiesThrombus Aging +-----+---------------+---------+-----------+----------+--------------+ CFV  Full           Yes      Yes                                 +-----+---------------+---------+-----------+----------+--------------+   +---------+---------------+---------+-----------+----------+--------------+ LEFT     CompressibilityPhasicitySpontaneityPropertiesThrombus Aging +---------+---------------+---------+-----------+----------+--------------+ FV DistalFull           Yes      Yes                                 +---------+---------------+---------+-----------+----------+--------------+ POP      Full           Yes      Yes                                 +---------+---------------+---------+-----------+----------+--------------+ PTV      Full                                                         +---------+---------------+---------+-----------+----------+--------------+ PERO     Full                                                        +---------+---------------+---------+-----------+----------+--------------+  Left Technical Findings: Not visualized segments include CFV, PFV, Prox & Mid FV, SFJ. Patient just underwent I & D of large left thigh abscess yesterday. Bandaged from groin to distal thigh - unable to remove. Limited study.   Summary: RIGHT: - No evidence of common femoral vein obstruction.  LEFT: - There is no evidence of deep vein thrombosis in the lower extremity. However, portions of this examination were limited- see technologist comments above.  - No cystic structure found in the popliteal fossa.  *See table(s) above for measurements and observations. Electronically signed by Coral Else MD on 09/28/2020 at 10:06:38 PM.    Final     Assessment/Plan: Left hip septic arthritis/AVN,  multiple left thigh and retroperitoneal abscesses - 2/23 - placement of drain into left retroperitoneal abscess and two into left thigh by IR - 2/24 -left hip total arthroplasty with cement interposition spacer - 3/1 - left thigh irrigation and debridement with wound vac placement by orthopedics, removal of left retroperitoneal drain - 3/3 - I&D of left groin abscess with change of wound vac by general surgery 3/4 - CT guided drainage of left thigh fluid collection by IR - 3/8 - left thigh repeat irrigation and debridement by orthopedics with partial closure of wound, incision and drainage of left thigh abscess/left groin abscess by general surgery - doppler yesterday was negative for DVT - WBAT LLE, continue OT and PT as able - defer to primary for pharmacologic DVT prophylaxis due to coagulopathy, continue SCD's  - continue BID dressing changes to left hip wound with gauze and ABD's, continue changing twice a day - dressing changes to left groin per general  surgery  Contact information:   Weekdays 8-5 Janine Ores, PA-C [phone] A fter hours and holidays please check [email or web address] for group call information for [facility]  Armida Sans 09/29/2020, 9:58 AM

## 2020-09-29 NOTE — Progress Notes (Signed)
PROGRESS NOTE    [name]  FYB:[id number] DOB: 1985-08-12 DOA: 09/13/2020 PCP: [name], PA   Brief Narrative:  The patient is a 35 year old male with past medical history of AVN of the hip, hypertension, GERD he was recently admitted on 08/22/2020 for COVID-19 and was discharged on 08/29/2020. 2 days after discharge patient began to feel extremely weak and tired with symptoms progressing over next 2 weeks. Patient developed left thigh rash and leg pain with swelling and tenderness to touch. Patient was seen at PCP office and was told to go to the ED immediately. He was found to have left iliopsoas and left gluteal abscess with diffuse swelling and left hip effusion on CT scan. General surgery was consulted. Blood cultures positive for MSSA bacteremia. He underwent drainage of the abscess with 3 drain placement by IR in his iliopsoas muscle and multiple abscesses in the legs. On 09/15/2020 had left hip interposition arthroplasty with antibiotic spacer placement and debridement and developed decompensated septic shock with refractory hypotension despite 2 units PRBC, 2.2 L of crystalloid and 500 cc of albumin. He was admitted to ICU with vasopressor support. ID has been consulted and recommend 6 weeks of IV cefazolin.Patient also had scrotal edema anasarca in the setting of IV fluid resuscitation. Ultrasound was unremarkable for abscess but showed marked asymmetric left scrotal wall thickening and fluid with increased blood flow consistent with soft tissue infection.  Patient had multiple incision and drainage done by general surgery and also multiple aspiration of the abscesses with drain placement per IR.  He continues to need abscess drainage and wound VAC placement as per general surgery, Ortho and IR.  Because his abdomen was more distended and CT scan of the abdomen pelvis done last week showed moderate to large ascites we will consult GI.  I discussed with them about obtaining  a ultrasound of the abdomen and they will order an appropriate we will order a paracentesis.  Patient was given a dose of IV Lasix given his anasarca now.  Assessment & Plan:   Principal Problem:   MSSA bacteremia Active Problems:   Alcohol dependence ([place])   Alcoholic hepatitis   Sepsis ([place])   Septic arthritis ([place])   Iliopsoas abscess on left ([place])  Iliopsoas and left gluteal abscesses associated with MSSA Bacteremia and Groin Abscess Septic shock secondary to above and left hip septic arthritis and MSSA bacteremia Left hip effusion and septic arthritis in the setting of avascular necrosis -Admitted to Inpatient Telemetry  -Initially started on IV vanc, cefepime; pharmacy to dose but changed to IV Cefazolin by ID -Repeat Blood Cx x2 and obtain ECHO; See below -Initial Blood Cx 09/13/20 showed Staph Aureus in Both sets of Cx's -Fluids had initially stopped but then he received 2.2 crystalloid boluses as well as 2 units of PRBCs postoperatively -Sepsis Criteria met with: tachycardia, tachypnea, lactic acid 3.3, and Leukocytosis was 10.6 source: abscesses as above as well as bacteremia and went into septic shock during this admission -Procalcitonin is improved -WBC remains elevated but is relatively stable at 14.0 and upward slightly and is now 15.6 -Lactic had up to 5.3 and then trended down -General surgery recs IR involvement for possible abscess drainage and for ortho to review ([facility] consulted IR, EDP has consulted ortho); appreciate assistance -Interventional radiology 3 drains in place today via CT-guided placement with a 14 French drainage catheter into the left retroperitoneal abscess into 12 French drains in the medial and lateral abscesses with the anterior aspect  of left thigh and there is a total of 1.3 L of purulent fluid that was aspirated following drain placement sent for evaluation -Interventional Radiology evaluated the drains and they are recommending continue drain  management and continue every shift flushes and monitoring of output; he is going to go to the OR today and if the drains are remain and the following plan for this is to repeat a CT scan when his drain output is less than 10 cc/day to assess for possible removal however repeat CT was done below -Drain Gram Stain showing Moderate WBC, Abundant Gram Positive Cocci, and Abundant Gram Negative Rods with Cultures showing abundant Staph Aureus which is pan sensitive -Infectious disease is now recommending a prolonged 6-week course of IV cefazolin and he will need oral medications after for extension and they recommend that is okay to place a PICC but they are recommending to wait there is ongoing abscess and drainage could also consider rifampin at some point. -Infectious diseases consulted and recommending changing antibiotic to cefazolin and following repeat echocardiogram; ECHO as below did not mention any vegetation; infectious diseases is now recommending the addition of Flagyl -Repeat blood cultures have been done and show NGTD at 5 Days; since if TTE is negative they can recommend a transesophageal echocardiogram then but since his TTE was a difficult Study, ID Dr. Juleen China recommending TEE given Disseminated MSSA infection so he underwent a transesophageal echocardiogram on Monday, 09/19/2020. -Patient underwent a repeat CT scan of the abdomen pelvis as well as the femur and showed " Interval placement of left pelvic and two left thigh drains for multiple abscesses. The pelvic components of the fluid collections have mildly improved, but there are multiple persistent large peripherally enhancing  complex fluid collections within the left thigh which are incompletely drained. These are quite extensive in length, extending into the distal thigh. The proximal component of the anteromedial collection has  slightly enlarged. No evidence of osteomyelitis or large hip joint effusion. 3. Interval left total hip  arthroplasty. The hardware appears well positioned." -Because of this orthopedic surgery reviewed the CT scan and I feel the best course of action is to take the patient for an open irrigation debridement of the medial and lateral thigh to express these fluid collections he went back to the OR on 09/21/2020 as well as 09/27/2020 neurosurgery was also consulted given that infection had spread into the left groin -LE Venous Duplex repeated and showed no DVT but portions were limited  -General surgery is recommending continue antibiotics as well as dressing changes twice daily -Continue PT OT evaluation and continue dressing changes per orthopedic surgery and VT prophylaxis further recommendations but currently this is held given his anemia and thrombocytopenia -PT/OT recommending CIR but once he is only cleared from a medical perspective and once orthopedic surgery signed off the case and has no further debridement plans  Chronic Pain -pain is not controlled, will start MS Contin 15 mg p.o. twice daily, discontinue oxycodone.   -We will continue with Dilaudid 1 mg IV every 4 hours as needed for breakthrough pain  Symptomatic Anemia Macrocytic Anemia -Presented with Hgb/Hct of 6.2/18.9 -Patient is status post 8 units of PRBCs total  -The patient's hemoglobin/hematocrit is now 8.2/26.1 -FOBT negative -Anemia panel done and showed an iron level of 44, U IBC 131, TIBC 175, saturation ratios of 25% -check LDH and was 308, haptoglobin pending, iron studies showed an iron level of 44, U IBC 131, TIBC 175, saturation ratios of 25% -  Continue to monitor for signs and symptoms of bleeding; currently no overt bleeding noted  -repeat CBC in a.m.  Hx of Alcoholic Hepatitis and alcohol abuse Hepatic Cirrhosis ascites Hyperbilirubinemia, was improving and now worsening  -Was continiung Prednisolone but this was discontinued due to disseminated MSSA bacteremia. -LFTs have normalized and so has his T bili but he  continues to have some moderate ascites to large ascites noted on his CT scan and is more distended today with his anasarca in the setting of his hepatic cirrhosis -Patient and family report he has been alcohol free for 6 weeks -Continue lactulose 10 g p.o. twice daily, thiamine, magnesium -abdomen appears more distended appear more fluid-filled today -Will have GI weigh in for addition of spironolactone and furosemide and defer to them to obtain a ultrasound of the abdomen to evaluate his ascites as patient may need a paracentesis and fluid studies -Continue monitor hepatic function daily and has bilirubin and his LFTs are normalized  Hx of Tachycardia; Now in Sinus Tachycardia intermittently HTN  -Baseline heart rate is anywhere between 100-110 per family -Continue Carvedilol 3.125 mg p.o. twice daily (held due to hypotension but now resumed) and will increase the dose to 6.2 5 in the AM and will continue for now  GAD -Continue sertraline 25 mg p.o. nightly  GERD -C/w Pantoprazole 40 mg p.o. twice daily  Hyponatremia -Initiated on IV fluid when he was in septic shock but now this to stop and he was given diuresis -Sodium is now 130 yesterday and today is 129 -May need some more Lasix and will add IV Lasix 40 mg x1 today  -Continue to monitor and trend and repeat CMP in a.m.  Metabolic Acidosis -Mild  -Patient CO2 is now  20, anion gap is  9, chloride level is 100 -We have stopped his IV fluid hydration and will give a dose of IV Lasix today -Continue to monitor and trend and repeat CMP in a.m.  Hypophosphatemia -Patient's phosphorus level is now gone from 2.5 and is now three-point -Continue to monitor and replete as necessary -Repeat phosphorus level in a.m.  Hypomagnesemia -Patient magnesium was 1.6 -Replete with IV mag sulfate 2 g -Continue to monitor and replete as necessary -Repeat Mag Level in the AM   Scrotal Edema and Anasarca, persistent -Patient is  positive + 5.688 L since admission -In the setting of his hepatic cirrhosis and IV fluid resuscitation as well as his infection; now has a lot of ascites -Stopped IV fluids and recommend scrotal elevation and sling and obtain a scrotal ultrasound and Doppler to rule out any pathology -Initial scrotal U/S done and showed "No evidence of testicular mass or torsion. Marked asymmetric left scrotal wall thickening and fluid, with increased blood flow. This is likely due to soft tissue infection, and part of the much more widespread process in the left pelvis and thigh demonstrated on prior CT." -Foley catheter has been removed but now has been placed back in -Repeat ultrasound done yesterday was concerning for soft tissue infection but urology evaluated and appreciate Dr. Lynne Logan evaluation and feels that this appears to be related to the regional edema from his leg and inguinal infection as well as edema.  There is no abscess on recent ultrasound and no evidence of scrotal skin infection and they recommended continue to monitor observe and recommending continuing for a long is a difficult for the patient ambulate for using urinal -Continue with Extremity Elevation and will give a dose of IV Lasix  today  Thrombocytopenia  -Patient's platelet count has now normalized and is 176,000 -Continue to monitor for signs and symptoms of bleeding; currently no overt bleeding noted -Repeat CBC in a.m.  DVT prophylaxis: SCDs Code Status: FULL CODE Family Communication: Discussed with Girlfriend at bedside  Disposition Plan: Pending further clinical improvement   Status is: Inpatient  Remains inpatient appropriate because:Unsafe d/c plan, IV treatments appropriate due to intensity of illness or inability to take PO and Inpatient level of care appropriate due to severity of illness   Dispo: The patient is from: Home              Anticipated d/c is to: CIR              Patient currently is not medically  stable to d/c.   Difficult to place patient No  Consultants:   General Surgery  Urology  Orthopedic Surgery  Interventional Radiology  Infectious Diseases   Gastroenterology    Procedures:  TOTAL HIP ARTHROPLASTY WITH CEMENT INTERPOSITION SPACINGfor septic hip arthritis from septicemia in the setting of alcoholic AVN of the left hip  Drainage of the abscess with 3 drain placement by IR in iliopsoas muscle and multiple abscesses in the legs  PROCEDURE by Dr. Mardelle Matte on 09/27/20: Left thigh irrigation and debridement, excisional debridement, skin, subcutaneous tissue, muscle with partial closure  Procedure: Incision and drainage of left groin abscess and left thigh abscess done by Dr. Romana Juniper on 09/27/20   Antimicrobials:  Anti-infectives (From admission, onward)   Start     Dose/Rate Route Frequency Ordered Stop   09/29/20 1700  rifampin (RIFADIN) capsule 300 mg        300 mg Oral Every 12 hours 09/29/20 1131     09/26/20 1300  rifampin (RIFADIN) 300 mg in sodium chloride 0.9 % 100 mL IVPB  Status:  Discontinued        300 mg 200 mL/hr over 30 Minutes Intravenous Every 12 hours 09/26/20 1156 09/29/20 1131   09/18/20 1400  metroNIDAZOLE (FLAGYL) tablet 500 mg        500 mg Oral Every 8 hours 09/18/20 0927     09/15/20 1715  tobramycin (NEBCIN) powder  Status:  Discontinued          As needed 09/15/20 1715 09/15/20 2043   09/15/20 1715  vancomycin (VANCOCIN) powder  Status:  Discontinued          As needed 09/15/20 1716 09/15/20 2043   09/14/20 0600  ceFAZolin (ANCEF) IVPB 2g/100 mL premix        2 g 200 mL/hr over 30 Minutes Intravenous Every 8 hours 09/14/20 0359     09/13/20 2200  vancomycin (VANCOREADY) IVPB 1500 mg/300 mL  Status:  Discontinued        1,500 mg 150 mL/hr over 120 Minutes Intravenous Every 12 hours 09/13/20 1636 09/14/20 0359   09/13/20 1500  ceFEPIme (MAXIPIME) 2 g in sodium chloride 0.9 % 100 mL IVPB  Status:  Discontinued        2 g 200 mL/hr  over 30 Minutes Intravenous Every 8 hours 09/13/20 1437 09/14/20 0359   09/13/20 1130  vancomycin (VANCOREADY) IVPB 1750 mg/350 mL        1,750 mg 175 mL/hr over 120 Minutes Intravenous  Once 09/13/20 1107 09/13/20 1414   09/13/20 1045  ceFAZolin (ANCEF) IVPB 1 g/50 mL premix        1 g 100 mL/hr over 30 Minutes Intravenous  Once 09/13/20 1031  09/13/20 1307       Subjective: Seen and examined at bedside and his girlfriend thinks he is getting more swollen today.  We will give him another dose of Lasix.  States pain remains.  Has more mobility than last week.  Denies any lightheadedness or dizziness.  Feels okay.  No nausea or vomiting.  Objective: Vitals:   09/28/20 1742 09/28/20 2151 09/29/20 0208 09/29/20 0619  BP: 125/81 115/72 121/79 118/73  Pulse: (!) 136 (!) 115 (!) 114 (!) 110  Resp: 15 18 16 18   Temp:  98.9 F (37.2 C) 98.1 F (36.7 C) 98.2 F (36.8 C)  TempSrc:  Oral Oral   SpO2: 93% 94% 94% 94%  Weight:      Height:        Intake/Output Summary (Last 24 hours) at 09/29/2020 1224 Last data filed at 09/29/2020 1000 Gross per 24 hour  Intake 450 ml  Output 1225 ml  Net -775 ml   Filed Weights   09/19/20 0843 09/21/20 0500 09/28/20 0545  Weight: 97.8 kg 93.9 kg 93.2 kg   Examination: Physical Exam:  Constitutional: WN/WD, in no acute distress appears calm and slightly uncomfortable Eyes: PERRL, lids and conjunctivae normal, sclerae anicteric  ENMT: External Ears, Nose appear normal. Grossly normal hearing. Mucous membranes are moist.  Neck: Appears normal, supple, no cervical masses, normal ROM, no appreciable thyromegaly; no JVD Respiratory: Diminished to auscultation bilaterally, no wheezing, rales, rhonchi or crackles. Normal respiratory effort and patient is not tachypenic. No accessory muscle use.  Unlabored breathing Cardiovascular: Slightly tachycardic, no murmurs / rubs / gallops. S1 and S2 auscultated.  Is 1-2+ lower extremity pitting edema Abdomen: Soft,  non-tender, distended and does have a fluid wave shift and some ascites. Bowel sounds positive.  GU: Deferred.  Has a Foley catheter in place and scrotum is still fairly swollen Musculoskeletal: No clubbing / cyanosis of digits/nails.  Left leg has some drains in it Skin: Skin wounds in moisture associated damage noted. No induration; Warm and dry.  Neurologic: CN 2-12 grossly intact with no focal deficits. Romberg sign and cerebellar reflexes not assessed.  Psychiatric: Normal judgment and insight. Alert and oriented x 3. Normal mood and appropriate affect.   Data Reviewed: I have personally reviewed following labs and imaging studies  CBC: Recent Labs  Lab 09/25/20 0247 09/26/20 0502 09/27/20 0512 09/28/20 0442 09/29/20 0502  WBC 15.1* 14.8* 13.6* 14.0* 15.6*  NEUTROABS  --   --   --   --  11.2*  HGB 7.5* 7.1* 8.1* 7.7* 8.2*  HCT 23.3* 22.0* 25.1* 24.5* 26.1*  MCV 102.2* 102.8* 100.4* 104.3* 103.2*  PLT 155 159 169 176 053   Basic Metabolic Panel: Recent Labs  Lab 09/25/20 0247 09/26/20 0502 09/27/20 0512 09/28/20 0442 09/29/20 0502  NA 127* 127* 129* 130* 129*  K 5.3* 4.5 4.2 4.1 4.4  CL 96* 98 100 100 100  CO2 25 23 25  21* 20*  GLUCOSE 92 91 89 130* 88  BUN 11 10 12 14 14   CREATININE 0.44* 0.36* 0.49* 0.41* 0.33*  CALCIUM 7.6* 7.3* 7.4* 7.6* 7.8*  MG  --   --   --  1.7 1.6*  PHOS  --   --   --  2.5 3.3   GFR: Estimated Creatinine Clearance: 146.4 mL/min (A) (by C-G formula based on SCr of 0.33 mg/dL (L)). Liver Function Tests: Recent Labs  Lab 09/24/20 0912 09/25/20 0247 09/26/20 0502 09/28/20 0442 09/29/20 0502  AST 32 31  28 28 28   ALT 9 8 8 8 7   ALKPHOS 203* 198* 195* 235* 189*  BILITOT 0.9 1.1 1.0 0.9 1.0  PROT 5.0* 5.2* 5.0* 5.3* 5.3*  ALBUMIN 1.9* 1.8* 1.8* 1.9* 1.8*   No results for input(s): LIPASE, AMYLASE in the last 168 hours. No results for input(s): AMMONIA in the last 168 hours. Coagulation Profile: No results for input(s): INR, PROTIME  in the last 168 hours. Cardiac Enzymes: No results for input(s): CKTOTAL, CKMB, CKMBINDEX, TROPONINI in the last 168 hours. BNP (last 3 results) No results for input(s): PROBNP in the last 8760 hours. HbA1C: No results for input(s): HGBA1C in the last 72 hours. CBG: No results for input(s): GLUCAP in the last 168 hours. Lipid Profile: No results for input(s): CHOL, HDL, LDLCALC, TRIG, CHOLHDL, LDLDIRECT in the last 72 hours. Thyroid Function Tests: No results for input(s): TSH, T4TOTAL, FREET4, T3FREE, THYROIDAB in the last 72 hours. Anemia Panel: No results for input(s): VITAMINB12, FOLATE, FERRITIN, TIBC, IRON, RETICCTPCT in the last 72 hours. Sepsis Labs: No results for input(s): PROCALCITON, LATICACIDVEN in the last 168 hours.  No results found for this or any previous visit (from the past 240 hour(s)).   RN Pressure Injury Documentation:     Estimated body mass index is 29.48 kg/m as calculated from the following:   Height as of this encounter: 5' 10"  (1.778 m).   Weight as of this encounter: 93.2 kg.  Malnutrition Type:  Nutrition Problem: Increased nutrient needs Etiology: chronic illness   Malnutrition Characteristics:  Signs/Symptoms: estimated needs   Nutrition Interventions:  Interventions: Juven,Boost Breeze,Prostat     Radiology Studies: US SCROTUM  Result Date: 09/27/2020 CLINICAL DATA:  Swelling.  Drain placement in left thigh on 03/04. EXAM: SCROTAL ULTRASOUND DOPPLER ULTRASOUND OF THE TESTICLES TECHNIQUE: Complete ultrasound examination of the testicles, epididymis, and other scrotal structures was performed. Color and spectral Doppler ultrasound were also utilized to evaluate blood flow to the testicles. COMPARISON:  Ultrasound dated 09/17/2020 FINDINGS: Right testicle Measurements: 3 x 2.1 x 2.3 cm. No mass or microlithiasis visualized. Left testicle Measurements: 3.5 x 2.1 x 1.8 cm. No mass or microlithiasis visualized. Right epididymis:  Normal in  size and appearance. Left epididymis:  Normal in size and appearance. Hydrocele: There are bilateral relatively small hydroceles, right greater than left. Varicocele:  None visualized. Pulsed Doppler interrogation of both testes demonstrates normal low resistance arterial and venous waveforms bilaterally. There is severe diffuse scrotal wall thickening, greatest on the left. IMPRESSION: 1. No testicular mass. 2. No testicular torsion. 3. Diffuse scrotal wall thickening, greatest on the left. Findings are concerning for an underlying soft tissue infection. The soft tissues are edematous without a clear drainable fluid collection. Electronically Signed   By: Constance Holster M.D.   On: 09/27/2020 17:06   VAS [place] LOWER EXTREMITY VENOUS (DVT)  Result Date: 09/28/2020  Lower Venous DVT Study Indications: Possible injury to blood vessels per order in chart.  Limitations: Bandages and poor ultrasound/tissue interface. Comparison Study: no previous exams Performing Technologist: Rogelia Rohrer  Examination Guidelines: A complete evaluation includes B-mode imaging, spectral Doppler, color Doppler, and power Doppler as needed of all accessible portions of each vessel. Bilateral testing is considered an integral part of a complete examination. Limited examinations for reoccurring indications may be performed as noted. The reflux portion of the exam is performed with the patient in reverse Trendelenburg.  +-----+---------------+---------+-----------+----------+--------------+  RIGHT Compressibility Phasicity Spontaneity Properties Thrombus Aging  +-----+---------------+---------+-----------+----------+--------------+  CFV   Full  Yes       Yes                                    +-----+---------------+---------+-----------+----------+--------------+   +---------+---------------+---------+-----------+----------+--------------+  LEFT      Compressibility Phasicity Spontaneity Properties Thrombus Aging   +---------+---------------+---------+-----------+----------+--------------+  FV Distal Full            Yes       Yes                                    +---------+---------------+---------+-----------+----------+--------------+  POP       Full            Yes       Yes                                    +---------+---------------+---------+-----------+----------+--------------+  PTV       Full                                                             +---------+---------------+---------+-----------+----------+--------------+  PERO      Full                                                             +---------+---------------+---------+-----------+----------+--------------+   Left Technical Findings: Not visualized segments include CFV, PFV, Prox & Mid FV, SFJ. Patient just underwent I & D of large left thigh abscess yesterday. Bandaged from groin to distal thigh - unable to remove. Limited study.   Summary: RIGHT: - No evidence of common femoral vein obstruction.  LEFT: - There is no evidence of deep vein thrombosis in the lower extremity. However, portions of this examination were limited- see technologist comments above.  - No cystic structure found in the popliteal fossa.  *See table(s) above for measurements and observations. Electronically signed by Harold Barban MD on 09/28/2020 at 10:06:38 PM.    Final    Scheduled Meds:  (feeding supplement) PROSource Plus  30 mL Oral BID BM   carvedilol  6.25 mg Oral BID WC   Chlorhexidine Gluconate Cloth  6 each Topical Daily   docusate sodium  100 mg Oral BID   feeding supplement  1 Container Oral BID BM   folic acid  1 mg Oral Daily   lactulose  10 g Oral BID   mouth rinse  15 mL Mouth Rinse BID   metroNIDAZOLE  500 mg Oral Q8H   morphine  15 mg Oral Q12H   multivitamin with minerals  1 tablet Oral Daily   nutrition supplement (JUVEN)  1 packet Oral BID BM   pantoprazole  40 mg Oral BID   rifampin  300 mg Oral Q12H   senna  1 tablet Oral  BID   sertraline  25 mg Oral Daily   sodium chloride flush  5 mL Intracatheter Q8H   sodium  chloride flush  5 mL Intracatheter Q8H   thiamine  100 mg Oral Daily   Continuous Infusions:   ceFAZolin (ANCEF) IV 2 g (09/29/20 1024)    LOS: 16 days    Kerney Elbe, DO [facility] PAGER is on AMION  If 7PM-7AM, please contact night-coverage [email or web address]

## 2020-09-29 NOTE — Consult Note (Addendum)
[email or web address]   Consultation  Referring Provider: [facility]/ [facility] Primary Care Physician:  [name], [place] Primary Gastroenterologist:  None/unassigned  Reason for Consultation:   Cirrhosis, ascites  HPI: JAIDYN KUHL is a 35 y.o. male, who GI is asked to see regarding cirrhosis and ascites with increase in abdominal distention.  Patient has had a complicated course over the past couple of months.  He was initially admitted on 08/22/2020 with COVID-19.  He was also diagnosed with alcoholic hepatitis during that admission, and met criteria for steroid therapy and was started on a course of prednisolone.  He was seen by [facility]/Dr. Michail Sermon at that time. He required readmission on 09/13/2020 with complaints of generalized fatigue and left lower extremity pain.  He was found to have a left iliopsoas and left gluteal abscess and a left hip effusion.  Since diagnosed with disseminated MSSA infection.  He has had several surgeries and I&D since that time.  On 09/27/2020 he underwent I&D of the left thigh again with partial closure and surgery did an I&D of the left groin abscess at that same setting.  He has been complaining of left scrotal edema, on ultrasound yesterday was found to have significant soft tissue thickening consistent with soft tissue infection. He is being followed by infectious disease. He had CT scan done of the abdomen and pelvis on 09/22/2020 which shows a cirrhotic appearing liver, mild splenomegaly and moderate to large amount of ascites similar to 09/18/2020. On initial imaging with CT on 09/13/2020 he was noted to have mildly nodular hepatic contour suggesting cirrhosis, no ascites was mentioned.  Initial ultrasound on 08/22/2020 showed steatosis or intrinsic hepatocellular disease, no ascites. Fortunately his LFTs have normalized with T bili 1.0/alk phos 185/AST of 28 and ALT of 7 WBC 15.6, hemoglobin 8.2, platelets 183 Creatinine 0.33 Last INR was 1.5 on 09/16/2020 which was  improved.  Patient tells me that his abdomen is not really bothering him though it has gradually gotten larger over the past couple of weeks.  He says he has had a big abdomen for a while, but seems to have gotten a bit larger over these past couple of weeks.  He denies any sense of tightness in his abdomen, does not feel that his abdomen is affecting his ability to eat and says he has been eating without difficulty.  He does feel very gassy, has been having normal bowel movements.   Past Medical History:  Diagnosis Date  . Alcohol abuse   . Asthma   . Depression   . Hypertension     Past Surgical History:  Procedure Laterality Date  . BUBBLE STUDY  09/19/2020   Procedure: BUBBLE STUDY;  Surgeon: Fay Records, MD;  Location: [facility];  Service: Cardiovascular;;  . FRACTURE SURGERY    . I & D EXTREMITY Left 09/20/2020   Procedure: IRRIGATION AND DEBRIDEMENT EXTREMITY LEFT LEG ABCESS;  Surgeon: Marchia Bond, MD;  Location: [facility];  Service: Orthopedics;  Laterality: Left;  . I & D EXTREMITY Left 09/27/2020   Procedure: IRRIGATION AND DEBRIDEMENT LEFT THIGH WITH WOUND  VAC APPLICATION;  Surgeon: Marchia Bond, MD;  Location: [facility];  Service: Orthopedics;  Laterality: Left;  . INCISION AND DRAINAGE OF WOUND Left 09/27/2020   Procedure: IRRIGATION AND DEBRIDEMENT LEFT GROIN;  Surgeon: Johnathan Hausen, MD;  Location: [facility];  Service: General;  Laterality: Left;  . IRRIGATION AND DEBRIDEMENT ABSCESS Left 09/22/2020   Procedure: IRRIGATION AND DEBRIDEMENT LEFT THIGH ABSCESS;  Surgeon: Brantley Stage,  Marcello Moores, MD;  Location: [facility];  Service: General;  Laterality: Left;  DOW ROOM STARTING AT 09:00AM FOR 60 MIN  . TEE WITHOUT CARDIOVERSION N/A 09/19/2020   Procedure: TRANSESOPHAGEAL ECHOCARDIOGRAM (TEE);  Surgeon: Fay Records, MD;  Location: [facility];  Service: Cardiovascular;  Laterality: N/A;  . TOTAL HIP ARTHROPLASTY Left 09/15/2020   Procedure: TOTAL HIP ARTHROPLASTY WITH CEMENT INTERPOSITION  SPACING;  Surgeon: Marchia Bond, MD;  Location: [facility];  Service: Orthopedics;  Laterality: Left;    Prior to Admission medications   Medication Sig Start Date End Date Taking? Authorizing Provider  carvedilol (COREG) 3.125 MG tablet Take 3.125 mg by mouth 2 (two) times daily. 07/28/20  Yes [provider]  folic acid (FOLVITE) 1 MG tablet Take 1 tablet (1 mg total) by mouth daily. 08/30/20  Yes Pokhrel, Laxman, MD  guaiFENesin-dextromethorphan (ROBITUSSIN DM) 100-10 MG/5ML syrup Take 5 mLs by mouth every 4 (four) hours as needed for cough (chest congestion). Patient taking differently: Take 10 mLs by mouth every 4 (four) hours as needed for cough (chest congestion). 08/29/20  Yes Pokhrel, Laxman, MD  lactulose (CHRONULAC) 10 GM/15ML solution Take 15 mLs (10 g total) by mouth 2 (two) times daily. 08/29/20  Yes Pokhrel, Laxman, MD  magnesium oxide (MAG-OX) 400 MG tablet Take 1 tablet by mouth daily. 07/28/20  Yes [provider]  pantoprazole (PROTONIX) 40 MG tablet Take 1 tablet (40 mg total) by mouth 2 (two) times daily before a meal for 28 days, THEN 1 tablet (40 mg total) daily for 28 days. 08/29/20 10/24/20 Yes Pokhrel, Laxman, MD  prednisoLONE (ORAPRED) 15 MG/5ML solution Take 1.6-13.3 mLs by mouth See admin instructions. Take 13.13m by mouth daily for 28 days, 149mfor 4 days, 6.40m29maily for 4 days, 3.3 daily for 4 days, then 1.40ml3mily for 3 days. 08/29/20  Yes [provider]  sertraline (ZOLOFT) 25 MG tablet Take 25 mg by mouth at bedtime. 08/20/20  Yes [provider]  thiamine 100 MG tablet Take 1 tablet (100 mg total) by mouth daily. 08/30/20  Yes Pokhrel, Laxman, MD  buPROPion (WELLBUTRIN XL) 300 MG 24 hr tablet Take 1 tablet (300 mg total) by mouth daily. Patient not taking: No sig reported 10/16/13   LugoNicholaus Bloom  carbamazepine (TEGRETOL XR) 200 MG 12 hr tablet Take 1 tablet (200 mg total) by mouth at bedtime. Patient not taking: No sig reported 10/16/13    LugoNicholaus Bloom  hydrOXYzine (ATARAX/VISTARIL) 25 MG tablet Take 1 tablet (25 mg total) by mouth every 6 (six) hours as needed for anxiety (or CIWA score </= 10). Patient not taking: No sig reported 10/16/13   LugoNicholaus Bloom  lisinopril-hydrochlorothiazide (ZESTORETIC) 20-25 MG tablet Take 1 tablet by mouth daily. Patient not taking: No sig reported 08/31/20   Pokhrel, LaxmCorrie Mckusick  oxyCODONE (ROXICODONE) 5 MG immediate release tablet Take 1 tablet (5 mg total) by mouth every 8 (eight) hours as needed. Patient not taking: No sig reported 08/29/20 08/29/21  Pokhrel, LaxmCorrie Mckusick  prednisoLONE 5 MG TABS tablet Take 8 tablets (40 mg total) by mouth daily for 28 days, THEN 6 tablets (30 mg total) daily for 4 days, THEN 4 tablets (20 mg total) daily for 4 days, THEN 2 tablets (10 mg total) daily for 4 days, THEN 1 tablet (5 mg total) every 3 (three) days for 1 day. Patient not taking: Reported on 09/13/2020 08/29/20 10/09/20  PokhFlora Lipps  traMADol (ULTRAM) 50 MG  tablet Take 1 tablet (50 mg total) by mouth every 6 (six) hours as needed. Patient not taking: Reported on 09/13/2020 08/29/20   Flora Lipps, MD    Current Facility-Administered Medications  Medication Dose Route Frequency Provider Last Rate Last Admin  . (feeding supplement) PROSource Plus liquid 30 mL  30 mL Oral BID BM Merlene Pulling K, PA-C   30 mL at 09/29/20 1015  . acetaminophen (TYLENOL) tablet 650 mg  650 mg Oral Q6H PRN Merlene Pulling K, PA-C   650 mg at 09/29/20 0747   Or  . acetaminophen (TYLENOL) suppository 650 mg  650 mg Rectal Q6H PRN Merlene Pulling K, PA-C      . carvedilol (COREG) tablet 6.25 mg  6.25 mg Oral BID WC Merlene Pulling K, PA-C   6.25 mg at 09/29/20 0747  . ceFAZolin (ANCEF) IVPB 2g/100 mL premix  2 g Intravenous Q8H Brown, Blaine K, PA-C 200 mL/hr at 09/29/20 1024 2 g at 09/29/20 1024  . Chlorhexidine Gluconate Cloth 2 % PADS 6 each  6 each Topical Daily Ventura Bruns, PA-C   6 each at 09/29/20 1019  . docusate  sodium (COLACE) capsule 100 mg  100 mg Oral BID Merlene Pulling K, PA-C   100 mg at 09/29/20 1019  . feeding supplement (BOOST / RESOURCE BREEZE) liquid 1 Container  1 Container Oral BID BM Merlene Pulling K, PA-C   1 Container at 09/25/20 1547  . fentaNYL (SUBLIMAZE) injection   Intravenous PRN Mir, Paula Libra, MD   25 mcg at 09/27/20 1203  . folic acid (FOLVITE) tablet 1 mg  1 mg Oral Daily Merlene Pulling K, PA-C   1 mg at 09/29/20 1018  . HYDROmorphone (DILAUDID) injection 1 mg  1 mg Intravenous Q4H PRN Merlene Pulling K, PA-C   1 mg at 09/29/20 1014  . HYDROmorphone (DILAUDID) injection 1 mg  1 mg Intravenous BID PRN Jill Alexanders, PA-C   1 mg at 09/29/20 1014  . lactulose (CHRONULAC) 10 GM/15ML solution 10 g  10 g Oral BID Merlene Pulling K, PA-C   10 g at 09/29/20 1015  . MEDLINE mouth rinse  15 mL Mouth Rinse BID Merlene Pulling K, PA-C   15 mL at 09/29/20 1021  . metoCLOPramide (REGLAN) tablet 5-10 mg  5-10 mg Oral Q8H PRN Merlene Pulling K, PA-C       Or  . metoCLOPramide (REGLAN) injection 5-10 mg  5-10 mg Intravenous Q8H PRN Merlene Pulling K, PA-C      . metroNIDAZOLE (FLAGYL) tablet 500 mg  500 mg Oral Q8H Brown, Blaine K, PA-C   500 mg at 09/29/20 1412  . midazolam (VERSED) injection   Intravenous PRN Mir, Paula Libra, MD   1 mg at 09/23/20 1505  . morphine (MS CONTIN) 12 hr tablet 15 mg  15 mg Oral Q12H Brown, Blaine K, PA-C   15 mg at 09/29/20 1021  . multivitamin with minerals tablet 1 tablet  1 tablet Oral Daily Ventura Bruns, PA-C   1 tablet at 09/29/20 1016  . nutrition supplement (JUVEN) (JUVEN) powder packet 1 packet  1 packet Oral BID BM Merlene Pulling K, PA-C   1 packet at 09/29/20 1308  . ondansetron (ZOFRAN) tablet 4 mg  4 mg Oral Q6H PRN Merlene Pulling K, PA-C       Or  . ondansetron [facility]) injection 4 mg  4 mg Intravenous Q6H PRN Merlene Pulling K, PA-C   4 mg at 09/27/20 1056  .  pantoprazole (PROTONIX) EC tablet 40 mg  40 mg Oral BID Merlene Pulling K, PA-C   40 mg at 09/29/20 1016   . polyethylene glycol (MIRALAX / GLYCOLAX) packet 17 g  17 g Oral Daily PRN Merlene Pulling K, PA-C      . rifampin (RIFADIN) capsule 300 mg  300 mg Oral Q12H Comer, Okey Regal, MD      . senna [facility]) tablet 8.6 mg  1 tablet Oral BID Merlene Pulling K, PA-C   8.6 mg at 09/29/20 1016  . sertraline (ZOLOFT) tablet 25 mg  25 mg Oral Daily Merlene Pulling K, PA-C   25 mg at 09/29/20 1018  . sodium chloride flush (NS) 0.9 % injection 5 mL  5 mL Intracatheter Q8H Brown, Blaine K, PA-C   5 mL at 09/29/20 1428  . sodium chloride flush (NS) 0.9 % injection 5 mL  5 mL Intracatheter Q8H Brown, Blaine K, PA-C   5 mL at 09/29/20 1428  . thiamine tablet 100 mg  100 mg Oral Daily Merlene Pulling K, PA-C   100 mg at 09/29/20 1018    Allergies as of 09/13/2020 - Review Complete 09/13/2020  Allergen Reaction Noted  . Cashew nut (anacardium occidentale) skin test Nausea And Vomiting 01/03/2013  . Cashew nut oil Nausea And Vomiting 01/03/2013    Family History  Problem Relation Age of Onset  . Cirrhosis Neg Hx   . Diabetes Mellitus II Neg Hx     Social History   Socioeconomic History  . Marital status: Single    Spouse name: Not on file  . Number of children: Not on file  . Years of education: Not on file  . Highest education level: Not on file  Occupational History  . Not on file  Tobacco Use  . Smoking status: Current Some Day Smoker    Types: Cigars  . Smokeless tobacco: Never Used  Substance and Sexual Activity  . Alcohol use: Yes    Alcohol/week: 12.0 - 18.0 standard drinks    Types: 12 - 18 Cans of beer per week    Comment: heavy drinker  . Drug use: Yes    Types: Marijuana  . Sexual activity: Yes    Birth control/protection: None  Other Topics Concern  . Not on file  Social History Narrative  . Not on file   Social Determinants of Health   Financial Resource Strain: Not on file  Food Insecurity: Not on file  Transportation Needs: Not on file  Physical Activity: Not on file   Stress: Not on file  Social Connections: Not on file  Intimate Partner Violence: Not on file    Review of Systems: Pertinent positive and negative review of systems were noted in the above HPI section.  All other review of systems was otherwise negative.  Physical Exam: Vital signs in last 24 hours: Temp:  [98.1 F (36.7 C)-98.9 F (37.2 C)] 98.5 F (36.9 C) (03/10 1346) Pulse Rate:  [100-136] 100 (03/10 1346) Resp:  [15-18] 18 (03/10 1346) BP: (114-125)/(72-81) 114/76 (03/10 1346) SpO2:  [93 %-97 %] 97 % (03/10 1346) Last BM Date: 09/28/20 Alert well-developed chronically ill-appearing white male ,pleasant and cooperative in NAD Head:  Normocephalic and atraumatic. Eyes:  Sclera clear, no icterus.   Conjunctiva pink. Ears:  Normal auditory acuity. Nose:  No deformity, discharge,  or lesions. Mouth:  No deformity or lesions.   Neck:  Supple; no masses or thyromegaly. Lungs:  Clear throughout to auscultation.   No  wheezes, crackles, or rhonchi. Heart:  Regular rate and rhythm; no murmurs, clicks, rubs,  or gallops. Abdomen:  Soft, obese, protuberant with nontense ascites, no definite fluid wave BS active,nonpalp mass or hepatomegaly, spleen tip palpable in the left upper quadrant Rectal: Not done Msk: Left hip and thigh dressed. Pulses:  Normal pulses noted. Extremities: 1+ edema in the right lower extremity to just above the knee Neurologic:  Alert and  oriented x4;  grossly normal neurologically. Skin:  Intact without significant lesions or rashes.. Psych:  Alert and cooperative. Normal mood and affect.  Intake/Output from previous day: 03/09 0701 - 03/10 0700 In: 310 [I.V.:10; IV Piggyback:300] Out: 1175 [Urine:1125; Drains:50] Intake/Output this shift: Total I/O In: 480 [P.O.:480] Out: 1050 [Urine:1050]  Lab Results: Recent Labs    09/27/20 0512 09/28/20 0442 09/29/20 0502  WBC 13.6* 14.0* 15.6*  HGB 8.1* 7.7* 8.2*  HCT 25.1* 24.5* 26.1*  PLT 169 176 183    BMET Recent Labs    09/27/20 0512 09/28/20 0442 09/29/20 0502  NA 129* 130* 129*  K 4.2 4.1 4.4  CL 100 100 100  CO2 25 21* 20*  GLUCOSE 89 130* 88  BUN _0 CREATININE 0.49* 0.41* 0.33*  CALCIUM 7.4* 7.6* 7.8*   LFT Recent Labs    09/29/20 0502  PROT 5.3*  ALBUMIN 1.8*  AST 28  ALT 7  ALKPHOS 189*  BILITOT 1.0   PT/INR No results for input(s): LABPROT, INR in the last 72 hours. Hepatitis Panel No results for input(s): HEPBSAG, HCVAB, HEPAIGM, HEPBIGM in the last 72 hours.    IMPRESSION:   #15  35 year old white male with history of EtOH abuse, admitted 08/22/2020 with Covid 19 and also diagnosed with severe alcoholic hepatitis at that time.  He met criteria for steroid therapy and was discharged on a course of prednisolone. Readmitted 09/13/2020 with significant fatigue and complaints of left lower extremity pain and thereafter diagnosed with MSSA bacteremia.  He has been found to have left iliopsoas and left  gluteal abscesses, as well as a left hip effusion.  He had left hip interposition arthroplasty on 09/15/2020 and subsequently developed decompensated septic shock and required vasopressor support. He has undergone several drainage procedures since that time with most recent I&D on 09/27/2020 of the left thigh and left groin abscess   Prednisolone was stopped on readmission. Fortunately the alcoholic hepatitis has for the most part resolved with normalization of LFTs.  2 most recent CT scans both with cirrhotic appearing liver and moderate to large amount of ascites. He has had increase in abdominal girth but does not have tense ascites and patient has no complaint of inability to eat or shortness of breath due to abdominal girth.  He also has some peripheral edema.  He does appear to have underlying cirrhosis.  While ascites can be seen with significant alcoholic hepatitis, suspect he has had some decompensation of cirrhosis due to his acute prolonged illness  with sepsis, MSSA disseminated infection.  #2 hypoalbuminemia multifactorial #3 anemia multifactorial  PLAN: Start 2 g sodium diet Check INR It is not imperative that he have a paracentesis as ascites is not causing the patient any significant discomfort.  However it may help with diuresis moving forward if he does have a paracentesis with removal of 3 to 4 L of fluid.  Patient mentioned that IR had discussed doing a paracentesis with him when he goes down for another drainage procedure for the abscess. If paracentesis could be  done at that same time, may be easiest for the patient is he will be sedated. Would need full labs with cell counts, cytology protein, LDH, albumin. Will start Lasix 40 mg p.o. daily and Aldactone 100 mg p.o. daily, and follow renal function carefully over the next several days. Weigh patient daily    Amy Shane Crutch 09/29/2020, 2:46 PM   Attending physician's note   I have taken a history, examined the patient and reviewed the chart. I agree with the Advanced Practitioner's note, impression and recommendations.  35 year old male with history of Covid infection in January, acute alcoholic hepatitis, MSSA abscesses s/p I&D, has a percutaneous drain in left thigh GI was consulted for worsening abdominal distention   Based on imaging appears to have progressed to cirrhosis and has features of portal hypertension MELD 10  We will request IR to do diagnostic paracentesis with cell count and culture to exclude SBP if he has significant ascites.  Also check ascitic fluid albumin, protein and LDH Low-sodium diet  Lasix and Aldactone to improve fluid status Monitor BMP daily  Please call with any questions, GI is available if needed but will not plan to round on him daily.    The patient was provided an opportunity to ask questions and all were answered. The patient agreed with the plan and demonstrated an understanding of the instructions.  Damaris Hippo ,  MD [phone]

## 2020-09-29 NOTE — Progress Notes (Signed)
2 Days Post-Op    CC: Fatigue, left leg pain  Subjective: Tolerated wound dressing fairly well, picture below  Objective: Vital signs in last 24 hours: Temp:  [98.1 F (36.7 C)-98.9 F (37.2 C)] 98.2 F (36.8 C) (03/10 0619) Pulse Rate:  [110-136] 110 (03/10 0619) Resp:  [15-18] 18 (03/10 0619) BP: (115-125)/(72-81) 118/73 (03/10 0619) SpO2:  [93 %-95 %] 94 % (03/10 0619) Last BM Date: 09/28/20  Intake/Output from previous day: 03/09 0701 - 03/10 0700 In: 310 [I.V.:10; IV Piggyback:300] Out: 1175 [Urine:1125; Drains:50] Intake/Output this shift: No intake/output data recorded.  General appearance: alert, cooperative and moderate discomfort, even with IV dilaudid for dressing change. Skin: see picture below  Lower groin wound with some fibrous stuff at the base but pretty clean.  Top wound is clean, lots of moisiture in the groin with cellulitis in both groins.  I did the dressing in 2 parts with Kerlix.  Top groin wound with one piece .  Second portion lower groin wound with packing up the tract to the groin wound.    Lab Results:  Recent Labs    09/28/20 0442 09/29/20 0502  WBC 14.0* 15.6*  HGB 7.7* 8.2*  HCT 24.5* 26.1*  PLT 176 183    BMET Recent Labs    09/28/20 0442 09/29/20 0502  NA 130* 129*  K 4.1 4.4  CL 100 100  CO2 21* 20*  GLUCOSE 130* 88  BUN 14 14  CREATININE 0.41* 0.33*  CALCIUM 7.6* 7.8*   PT/INR No results for input(s): LABPROT, INR in the last 72 hours.  Recent Labs  Lab 09/24/20 0912 09/25/20 0247 09/26/20 0502 09/28/20 0442 09/29/20 0502  AST 32 31 28 28 28   ALT 9 8 8 8 7   ALKPHOS 203* 198* 195* 235* 189*  BILITOT 0.9 1.1 1.0 0.9 1.0  PROT 5.0* 5.2* 5.0* 5.3* 5.3*  ALBUMIN 1.9* 1.8* 1.8* 1.9* 1.8*     Lipase     Component Value Date/Time   LIPASE 99 (H) 08/23/2020 1500     Medications: . (feeding supplement) PROSource Plus  30 mL Oral BID BM  . carvedilol  6.25 mg Oral BID WC  . Chlorhexidine Gluconate Cloth  6  each Topical Daily  . docusate sodium  100 mg Oral BID  . feeding supplement  1 Container Oral BID BM  . folic acid  1 mg Oral Daily  . furosemide  40 mg Intravenous Once  . lactulose  10 g Oral BID  . mouth rinse  15 mL Mouth Rinse BID  . metroNIDAZOLE  500 mg Oral Q8H  . morphine  15 mg Oral Q12H  . multivitamin with minerals  1 tablet Oral Daily  . nutrition supplement (JUVEN)  1 packet Oral BID BM  . pantoprazole  40 mg Oral BID  . senna  1 tablet Oral BID  . sertraline  25 mg Oral Daily  . sodium chloride flush  5 mL Intracatheter Q8H  . sodium chloride flush  5 mL Intracatheter Q8H  . thiamine  100 mg Oral Daily    Assessment/Plan Symptomatic anemia   -H/H 7.7/24.5  -Transfused 3/8 Sinus tachycardia, chronic  Thrombocytopenia  - Plts 155>> 159>> 169>> 176 PMH alcoholic hepatitis Hepatic cirrhosis with ascites GAD GERD Anemia - hgb7.1  PMH AVN  Septic shock due to below on admission -now resolved s/p below procedures and IV abx MSSA bacteremia Septic arthritis of the left hip Iliopsoas and left proximal and medial thigh abscesses  09/14/20 Placement 14 F IR perc drain into left RP abscess, two 12 F drains placed in L thigh (Cx - staph) 09/15/20 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 Sena Slate 09/20/20 Left thigh irrigation and debridement with wound vac placement, Dr. Dion Saucier Incision and drainage of left groin abscess, wound VAC change left thigh 09/22/2020, Dr. Harriette Bouillon, POD #7 I&D of left groin abscess and thigh abscess 09/27/2020, Dr. Twana First, POD #2 - continue Abx per ID - urology plans for ongoing monitoring of scrotal edema, No evidence of abscess or scrotal skin     infection  FEN -regular diet VTE -none currently ID -flagyl, ancef  Plan:   Dressing changes BID.    LOS: 16 days    [name] 09/29/2020 Please see Amion

## 2020-09-30 ENCOUNTER — Inpatient Hospital Stay (HOSPITAL_COMMUNITY): Payer: 59

## 2020-09-30 ENCOUNTER — Encounter (HOSPITAL_COMMUNITY): Payer: Self-pay | Admitting: Pulmonary Disease

## 2020-09-30 DIAGNOSIS — K7010 Alcoholic hepatitis without ascites: Secondary | ICD-10-CM | POA: Diagnosis not present

## 2020-09-30 DIAGNOSIS — K6812 Psoas muscle abscess: Secondary | ICD-10-CM | POA: Diagnosis not present

## 2020-09-30 DIAGNOSIS — F1029 Alcohol dependence with unspecified alcohol-induced disorder: Secondary | ICD-10-CM | POA: Diagnosis not present

## 2020-09-30 DIAGNOSIS — R7881 Bacteremia: Secondary | ICD-10-CM | POA: Diagnosis not present

## 2020-09-30 LAB — COMPREHENSIVE METABOLIC PANEL
ALT: 8 U/L (ref 0–44)
AST: 30 U/L (ref 15–41)
Albumin: 1.9 g/dL — ABNORMAL LOW (ref 3.5–5.0)
Alkaline Phosphatase: 181 U/L — ABNORMAL HIGH (ref 38–126)
Anion gap: 9 (ref 5–15)
BUN: 16 mg/dL (ref 6–20)
CO2: 21 mmol/L — ABNORMAL LOW (ref 22–32)
Calcium: 7.7 mg/dL — ABNORMAL LOW (ref 8.9–10.3)
Chloride: 98 mmol/L (ref 98–111)
Creatinine, Ser: 0.35 mg/dL — ABNORMAL LOW (ref 0.61–1.24)
GFR, Estimated: >60 mL/min (ref >60)
Glucose, Bld: 86 mg/dL (ref 70–99)
Potassium: 4 mmol/L (ref 3.5–5.1)
Sodium: 128 mmol/L — ABNORMAL LOW (ref 135–145)
Total Bilirubin: 1.2 mg/dL (ref 0.3–1.2)
Total Protein: 5.4 g/dL — ABNORMAL LOW (ref 6.5–8.1)

## 2020-09-30 LAB — ALBUMIN, PLEURAL OR PERITONEAL FLUID: Albumin, Fluid: <1 g/dL

## 2020-09-30 LAB — GLUCOSE, PLEURAL OR PERITONEAL FLUID: Glucose, Fluid: 111 mg/dL

## 2020-09-30 LAB — LACTATE DEHYDROGENASE, PLEURAL OR PERITONEAL FLUID: LD, Fluid: 23 U/L (ref 3–23)

## 2020-09-30 LAB — BODY FLUID CELL COUNT WITH DIFFERENTIAL
Lymphs, Fluid: 67 %
Monocyte-Macrophage-Serous Fluid: 16 % — ABNORMAL LOW (ref 50–90)
Neutrophil Count, Fluid: 17 % (ref 0–25)
Total Nucleated Cell Count, Fluid: 50 cu mm (ref 0–1000)

## 2020-09-30 LAB — CBC WITH DIFFERENTIAL/PLATELET
Abs Immature Granulocytes: 0.68 10*3/uL — ABNORMAL HIGH (ref 0.00–0.07)
Basophils Absolute: 0.1 10*3/uL (ref 0.0–0.1)
Basophils Relative: 0 %
Eosinophils Absolute: 0.1 10*3/uL (ref 0.0–0.5)
Eosinophils Relative: 0 %
HCT: 25.3 % — ABNORMAL LOW (ref 39.0–52.0)
Hemoglobin: 8.1 g/dL — ABNORMAL LOW (ref 13.0–17.0)
Immature Granulocytes: 4 %
Lymphocytes Relative: 18 %
Lymphs Abs: 3.2 10*3/uL (ref 0.7–4.0)
MCH: 32.8 pg (ref 26.0–34.0)
MCHC: 32 g/dL (ref 30.0–36.0)
MCV: 102.4 fL — ABNORMAL HIGH (ref 80.0–100.0)
Monocytes Absolute: 1.1 10*3/uL — ABNORMAL HIGH (ref 0.1–1.0)
Monocytes Relative: 6 %
Neutro Abs: 12.2 10*3/uL — ABNORMAL HIGH (ref 1.7–7.7)
Neutrophils Relative %: 72 %
Platelets: 202 10*3/uL (ref 150–400)
RBC: 2.47 MIL/uL — ABNORMAL LOW (ref 4.22–5.81)
RDW: 18.5 % — ABNORMAL HIGH (ref 11.5–15.5)
WBC: 17.3 10*3/uL — ABNORMAL HIGH (ref 4.0–10.5)
nRBC: 0.1 % (ref 0.0–0.2)

## 2020-09-30 LAB — PROTEIN, PLEURAL OR PERITONEAL FLUID: Total protein, fluid: <3 g/dL

## 2020-09-30 LAB — MAGNESIUM: Magnesium: 1.6 mg/dL — ABNORMAL LOW (ref 1.7–2.4)

## 2020-09-30 LAB — PHOSPHORUS: Phosphorus: 3.9 mg/dL (ref 2.5–4.6)

## 2020-09-30 MED ORDER — MIDAZOLAM HCL 2 MG/2ML IJ SOLN
INTRAMUSCULAR | Status: AC | PRN
  Administered 2020-09-30: 2 mg via INTRAVENOUS
  Administered 2020-09-30 (×2): 1 mg via INTRAVENOUS

## 2020-09-30 MED ORDER — MIDAZOLAM HCL 2 MG/2ML IJ SOLN
INTRAMUSCULAR | Status: AC
  Filled 2020-09-30: qty 4

## 2020-09-30 MED ORDER — MAGNESIUM SULFATE 2 GM/50ML IV SOLN
2.0000 g | Freq: Once | INTRAVENOUS | Status: AC
  Administered 2020-09-30: 18:00:00 2 g via INTRAVENOUS
  Filled 2020-09-30: qty 50

## 2020-09-30 MED ORDER — FENTANYL CITRATE (PF) 100 MCG/2ML IJ SOLN
INTRAMUSCULAR | Status: AC
  Filled 2020-09-30: qty 2

## 2020-09-30 MED ORDER — SODIUM CHLORIDE 0.9 % IV SOLN
INTRAVENOUS | Status: AC
  Filled 2020-09-30: qty 250

## 2020-09-30 MED ORDER — DIPHENHYDRAMINE HCL 50 MG/ML IJ SOLN
INTRAMUSCULAR | Status: AC
  Filled 2020-09-30: qty 1

## 2020-09-30 MED ORDER — LIDOCAINE HCL 1 % IJ SOLN
INTRAMUSCULAR | Status: AC
  Filled 2020-09-30: qty 20

## 2020-09-30 MED ORDER — FENTANYL CITRATE (PF) 100 MCG/2ML IJ SOLN
INTRAMUSCULAR | Status: AC | PRN
  Administered 2020-09-30 (×2): 50 ug via INTRAVENOUS

## 2020-09-30 MED ORDER — IOHEXOL 300 MG/ML  SOLN
100.0000 mL | Freq: Once | INTRAMUSCULAR | Status: AC | PRN
  Administered 2020-09-30: 13:00:00 100 mL via INTRAVENOUS

## 2020-09-30 MED ORDER — DIPHENHYDRAMINE HCL 50 MG/ML IJ SOLN
INTRAMUSCULAR | Status: AC | PRN
  Administered 2020-09-30: 25 mg via INTRAVENOUS

## 2020-09-30 NOTE — Progress Notes (Signed)
PROGRESS NOTE    [name]  JTT:[id number] DOB: [birth year] DOA: 09/13/2020 PCP: [name], PA   Brief Narrative:  The patient is a 35 year old male with past medical history of AVN of the hip, hypertension, GERD he was recently admitted on 08/22/2020 for COVID-19 and was discharged on 08/29/2020. 2 days after discharge patient began to feel extremely weak and tired with symptoms progressing over next 2 weeks. Patient developed left thigh rash and leg pain with swelling and tenderness to touch. Patient was seen at PCP office and was told to go to the ED immediately. He was found to have left iliopsoas and left gluteal abscess with diffuse swelling and left hip effusion on CT scan. General surgery was consulted. Blood cultures positive for MSSA bacteremia. He underwent drainage of the abscess with 3 drain placement by IR in his iliopsoas muscle and multiple abscesses in the legs. On 09/15/2020 had left hip interposition arthroplasty with antibiotic spacer placement and debridement and developed decompensated septic shock with refractory hypotension despite 2 units PRBC, 2.2 L of crystalloid and 500 cc of albumin. He was admitted to ICU with vasopressor support. ID has been consulted and recommend 6 weeks of IV cefazolin.Patient also had scrotal edema anasarca in the setting of IV fluid resuscitation. Ultrasound was unremarkable for abscess but showed marked asymmetric left scrotal wall thickening and fluid with increased blood flow consistent with soft tissue infection.  Patient had multiple incision and drainage done by general surgery and also multiple aspiration of the abscesses with drain placement per IR.  He continues to need abscess drainage and wound VAC placement as per general surgery, Ortho and IR.  Because his abdomen was more distended and CT scan of the abdomen pelvis done last week showed moderate to large ascites we will consult GI.  I discussed with them about obtaining  a ultrasound of the abdomen and they will order also ordered a paracentesis.  Patient was given a dose of IV Lasix given his anasarca now.  GI has started the patient on a 2 g sodium diet and also started the patient on Lasix 40 minutes p.o. daily as well as Aldactone 100 g p.o. daily and follow his renal function carefully for the next few days.  Paracentesis been ordered and they are recommending full labs with cell count, cytology, protein, LDH and albumin and recommendations for weigh the patient daily.  Patient underwent his paracentesis on 09/30/2020 today drains started putting out less output interventional radiology is going to obtain a follow-up CT of the abdomen pelvis as well as a left femur today for evaluation of his previous for sepsis and by drain.  Assessment & Plan:   Principal Problem:   MSSA bacteremia Active Problems:   Alcohol dependence ([place])   Alcoholic hepatitis   Sepsis ([place])   Septic arthritis ([place])   Iliopsoas abscess on left ([place])  Iliopsoas and left gluteal abscesses associated with MSSA Bacteremia and Groin Abscess Septic shock secondary to above and left hip septic arthritis and MSSA bacteremia Left hip effusion and septic arthritis in the setting of avascular necrosis -Admitted to Inpatient Telemetry  -Initially started on IV vanc, cefepime; pharmacy to dose but changed to IV Cefazolin by ID -Repeat Blood Cx x2 and obtain ECHO; See below -Initial Blood Cx 09/13/20 showed Staph Aureus in Both sets of Cx's -Fluids had initially stopped but then he received 2.2 crystalloid boluses as well as 2 units of PRBCs postoperatively -Sepsis Criteria met with: tachycardia, tachypnea, lactic  acid 3.3, and Leukocytosis was 10.6 source: abscesses as above as well as bacteremia and went into septic shock during this admission -Procalcitonin is improved -WBC remains elevated but is relatively stable at 14.0 and upward slightly and is now 15.6 -Lactic had up to 5.3 and then  trended down -General surgery recs IR involvement for possible abscess drainage and for ortho to review ([facility] consulted IR, EDP has consulted ortho); appreciate assistance -Interventional radiology 3 drains in place today via CT-guided placement with a 14 French drainage catheter into the left retroperitoneal abscess into 12 French drains in the medial and lateral abscesses with the anterior aspect of left thigh and there is a total of 1.3 L of purulent fluid that was aspirated following drain placement sent for evaluation -Interventional Radiology evaluated the drains and they are recommending continue drain management and continue every shift flushes and monitoring of output; he is going to go to the OR today and if the drains are remain and the following plan for this is to repeat a CT scan when his drain output is less than 10 cc/day to assess for possible removal however repeat CT was done below -Drain Gram Stain showing Moderate WBC, Abundant Gram Positive Cocci, and Abundant Gram Negative Rods with Cultures showing abundant Staph Aureus which is pan sensitive -Infectious disease is now recommending a prolonged 6-week course of IV cefazolin and he will need oral medications after for extension and they recommend that is okay to place a PICC but they are recommending to wait there is ongoing abscess and drainage could also consider rifampin at some point. -Infectious diseases consulted and recommending changing antibiotic to cefazolin and following repeat echocardiogram; ECHO as below did not mention any vegetation; infectious diseases is now recommending the addition of Flagyl -Repeat blood cultures have been done and show NGTD at 5 Days; since if TTE is negative they can recommend a transesophageal echocardiogram then but since his TTE was a difficult Study, ID Dr. Juleen China recommending TEE given Disseminated MSSA infection so he underwent a transesophageal echocardiogram on Monday, 09/19/2020. -Patient  underwent a repeat CT scan of the abdomen pelvis on 09/21/20 as well as the femur and showed " Interval placement of left pelvic and two left thigh drains for multiple abscesses. The pelvic components of the fluid collections have mildly improved, but there are multiple persistent large peripherally enhancing  complex fluid collections within the left thigh which are incompletely drained. These are quite extensive in length, extending into the distal thigh. The proximal component of the anteromedial collection has  slightly enlarged. No evidence of osteomyelitis or large hip joint effusion. 3. Interval left total hip arthroplasty. The hardware appears well positioned." -Because of this orthopedic surgery reviewed the CT scan and I feel the best course of action is to take the patient for an open irrigation debridement of the medial and lateral thigh to express these fluid collections he went back to the OR on 09/21/2020 as well as 09/27/2020 neurosurgery was also consulted given that infection had spread into the left groin -IR recommending Repeating CT Abd/Pelvis and Femer today 09/30/20 because WBC is also going up and this is PENDING to be done and read -LE Venous Duplex repeated and showed no DVT but portions were limited  -General surgery is recommending continue antibiotics as well as dressing changes twice daily -Continue PT OT evaluation and continue dressing changes per orthopedic surgery and VT prophylaxis further recommendations but currently this is held given his anemia and thrombocytopenia -  PT/OT recommending CIR but once he is only cleared from a medical perspective and once orthopedic surgery signed off the case and has no further debridement plans  Chronic Pain -pain is not controlled, will start MS Contin 15 mg p.o. twice daily, discontinue oxycodone.   -We will continue with Dilaudid 1 mg IV every 4 hours as needed for breakthrough pain  Symptomatic Anemia Macrocytic Anemia -Presented with  Hgb/Hct of 6.2/18.9 -Patient is status post 8 units of PRBCs total  -The patient's hemoglobin/hematocrit is now 8.2/26.1 yesterday and today stable at 8.1/25.3 -FOBT negative -Anemia panel done and showed an iron level of 44, U IBC 131, TIBC 175, saturation ratios of 25% -check LDH and was 308, haptoglobin pending, iron studies showed an iron level of 44, U IBC 131, TIBC 175, saturation ratios of 25% -Continue to monitor for signs and symptoms of bleeding; currently no overt bleeding noted  -repeat CBC in a.m.  Hx of Alcoholic Hepatitis and alcohol abuse Hepatic Cirrhosis ascites Hyperbilirubinemia, was improving and now worsening  -Was continiung Prednisolone but this was discontinued due to disseminated MSSA bacteremia. -LFTs have normalized and so has his T bili but he continues to have some moderate ascites to large ascites noted on his CT scan and is more distended today with his anasarca in the setting of his hepatic cirrhosis -Patient and family report he has been alcohol free for 6 weeks -Continue lactulose 10 g p.o. twice daily, thiamine, magnesium -abdomen appears more distended appear more fluid-filled today -Will have GI weigh in for addition of spironolactone and furosemide and defer to them to obtain a ultrasound of the abdomen to evaluate his ascites as patient may need a paracentesis and fluid studies -GI have asked IR to do a paracentesis and this was done on 09/30/2020 with removal of 3 L maximum and fluid was sent for analysis studies -GI is also added the patient on Lasix 40 mg p.o. daily and spironolactone 100 g p.o. daily and will need to continue monitor his renal function closely -INR was 1.4 -Continue monitor hepatic function daily and has bilirubin and his LFTs are normalized  Hx of Tachycardia; Now in Sinus Tachycardia intermittently HTN  -Baseline heart rate is anywhere between 100-110 per family -Continue Carvedilol 3.125 mg p.o. twice daily (held due to  hypotension but now resumed) and will increase the dose to 6.2 5 in the AM and will continue for now  GAD -Continue sertraline 25 mg p.o. nightly  GERD -C/w Pantoprazole 40 mg p.o. twice daily and will continue for now   Hyponatremia -Initiated on IV fluid when he was in septic shock but now this to stop and he was given diuresis -Sodium is now 130 yesterday and today is 128 -May need some more Lasix and will add IV Lasix 40 mg x1 yesterday; GI has now added 40 mg of p.o. Lasix as well as 100 mg spironolactone p.o. daily -Continue to monitor and trend and repeat CMP in a.m.  Metabolic Acidosis -Mild  -Patient CO2 is now  21, anion gap is  9, chloride level is 98 -We have stopped his IV fluid hydration and he was given a dose of IV Lasix yesterday -Continue to monitor and trend and repeat CMP in a.m.  Hypophosphatemia -Patient's phosphorus level is now gone from 2.5 and is now three-point -Continue to monitor and replete as necessary -Repeat phosphorus level in a.m.  Hypomagnesemia -Patient magnesium was 1.6 -Replete with IV mag sulfate 2 g again today -Continue  to monitor and replete as necessary -Repeat Mag Level in the AM   Scrotal Edema and Anasarca, persistent -Patient was positive + 5.688 L since admission but had a paracentesis which removed 3 L and he also received Lasix; now I's and O's with a is +977 mL since admission -In the setting of his hepatic cirrhosis and IV fluid resuscitation as well as his infection; now has a lot of ascites -Stopped IV fluids and recommend scrotal elevation and sling and obtain a scrotal ultrasound and Doppler to rule out any pathology -Initial scrotal U/S done and showed "No evidence of testicular mass or torsion. Marked asymmetric left scrotal wall thickening and fluid, with increased blood flow. This is likely due to soft tissue infection, and part of the much more widespread process in the left pelvis and thigh demonstrated on prior  CT." -Foley catheter has been removed but now has been placed back in -Repeat ultrasound done yesterday was concerning for soft tissue infection but urology evaluated and appreciate Dr. Lynne Logan evaluation and feels that this appears to be related to the regional edema from his leg and inguinal infection as well as edema.  There is no abscess on recent ultrasound and no evidence of scrotal skin infection and they recommended continue to monitor observe and recommending continuing for a long is a difficult for the patient ambulate for using urinal -Continue with Extremity Elevation and will give a dose of IV Lasix yesterday and he has p.o. Lasix and Aldactone added by gastroenterology  Thrombocytopenia  -Patient's platelet count has now normalized and is 202,000 now -Continue to monitor for signs and symptoms of bleeding; currently no overt bleeding noted -Repeat CBC in a.m.  DVT prophylaxis: SCDs Code Status: FULL CODE Family Communication: Discussed with Girlfriend at bedside  Disposition Plan: Pending further clinical improvement will go to CIR  Status is: Inpatient  Remains inpatient appropriate because:Unsafe d/c plan, IV treatments appropriate due to intensity of illness or inability to take PO and Inpatient level of care appropriate due to severity of illness   Dispo: The patient is from: Home              Anticipated d/c is to: CIR              Patient currently is not medically stable to d/c.   Difficult to place patient No  Consultants:   General Surgery  Urology  Orthopedic Surgery  Interventional Radiology  Infectious Diseases   Gastroenterology    Procedures:  TOTAL HIP ARTHROPLASTY WITH CEMENT INTERPOSITION SPACINGfor septic hip arthritis from septicemia in the setting of alcoholic AVN of the left hip  Drainage of the abscess with 3 drain placement by IR in iliopsoas muscle and multiple abscesses in the legs  PROCEDURE by Dr. Mardelle Matte on 09/27/20: Left thigh  irrigation and debridement, excisional debridement, skin, subcutaneous tissue, muscle with partial closure  Procedure: Incision and drainage of left groin abscess and left thigh abscess done by Dr. Romana Juniper on 09/27/20  Paracentesis done on 09/30/2020 performed yielding 3 liters (maximum ordered) of clear, light yellow fluid. No immediate complications.  A portion of the fluid was submitted to the lab for preordered studies.EBL none.   Antimicrobials:  Anti-infectives (From admission, onward)   Start     Dose/Rate Route Frequency Ordered Stop   09/29/20 1700  rifampin (RIFADIN) capsule 300 mg        300 mg Oral Every 12 hours 09/29/20 1131     09/26/20 1300  rifampin (RIFADIN) 300 mg in sodium chloride 0.9 % 100 mL IVPB  Status:  Discontinued        300 mg 200 mL/hr over 30 Minutes Intravenous Every 12 hours 09/26/20 1156 09/29/20 1131   09/18/20 1400  metroNIDAZOLE (FLAGYL) tablet 500 mg        500 mg Oral Every 8 hours 09/18/20 0927     09/15/20 1715  tobramycin (NEBCIN) powder  Status:  Discontinued          As needed 09/15/20 1715 09/15/20 2043   09/15/20 1715  vancomycin (VANCOCIN) powder  Status:  Discontinued          As needed 09/15/20 1716 09/15/20 2043   09/14/20 0600  ceFAZolin (ANCEF) IVPB 2g/100 mL premix        2 g 200 mL/hr over 30 Minutes Intravenous Every 8 hours 09/14/20 0359     09/13/20 2200  vancomycin (VANCOREADY) IVPB 1500 mg/300 mL  Status:  Discontinued        1,500 mg 150 mL/hr over 120 Minutes Intravenous Every 12 hours 09/13/20 1636 09/14/20 0359   09/13/20 1500  ceFEPIme (MAXIPIME) 2 g in sodium chloride 0.9 % 100 mL IVPB  Status:  Discontinued        2 g 200 mL/hr over 30 Minutes Intravenous Every 8 hours 09/13/20 1437 09/14/20 0359   09/13/20 1130  vancomycin (VANCOREADY) IVPB 1750 mg/350 mL        1,750 mg 175 mL/hr over 120 Minutes Intravenous  Once 09/13/20 1107 09/13/20 1414   09/13/20 1045  ceFAZolin (ANCEF) IVPB 1 g/50 mL premix        1  g 100 mL/hr over 30 Minutes Intravenous  Once 09/13/20 1031 09/13/20 1307       Subjective: Seen and examined at bedside.  Still in pain today and felt okay.  Felt fatigued.  No nausea or vomiting.  Was going to go for his paracentesis today.  No shortness of breath.  Legs remain swollen.  Girlfriend was asking about Protonix.  No other concerns or complaints at this time  Objective: Vitals:   09/29/20 1346 09/29/20 2142 09/30/20 0145 09/30/20 0531  BP: 114/76 122/74 123/81 126/80  Pulse: 100 (!) 118 (!) 119 (!) 121  Resp: 18 18 18 18   Temp: 98.5 F (36.9 C) 99.6 F (37.6 C) 98.3 F (36.8 C) 98.6 F (37 C)  TempSrc: Oral Oral Oral Oral  SpO2: 97% 91% 94% 95%  Weight:      Height:        Intake/Output Summary (Last 24 hours) at 09/30/2020 1344 Last data filed at 09/30/2020 1000 Gross per 24 hour  Intake 1160 ml  Output 2357 ml  Net -1197 ml   Filed Weights   09/19/20 0843 09/21/20 0500 09/28/20 0545  Weight: 97.8 kg 93.9 kg 93.2 kg   Examination: Physical Exam:  Constitutional: WN/WD overweight Caucasian male currently no acute distress appears uncomfortable though Eyes: Lids and conjunctivae normal, sclerae anicteric  ENMT: External Ears, Nose appear normal. Grossly normal hearing. Neck: Appears normal, supple, no cervical masses, normal ROM, no appreciable thyromegaly; no JVD Respiratory: Diminished to auscultation bilaterally with coarse breath sounds, no wheezing, rales, rhonchi or crackles. Normal respiratory effort and patient is not tachypenic. No accessory muscle use.  Cardiovascular: Tachycardic rate, no murmurs / rubs / gallops. S1 and S2 auscultated.  Has 1-2+ lower extremity pitting edema stable Abdomen: Soft, non-tender, distended secondary to body habitus and does have some ascites noted.Marland Kitchen  Bowel sounds positive.  GU: Deferred.  Foley catheter is in place Musculoskeletal: No clubbing / cyanosis of digits/nails.  Has 3 drains in his left leg Skin: Has skin  wounds with moisture associated damage noted as well as incisions from his surgeries. No induration; Warm and dry.  Neurologic: CN 2-12 grossly intact with no focal deficits. Romberg sign and cerebellar reflexes not assessed.  Psychiatric: Normal judgment and insight. Alert and oriented x 3.  Slightly depressed appearing mood and appropriate affect.   Data Reviewed: I have personally reviewed following labs and imaging studies  CBC: Recent Labs  Lab 09/26/20 0502 09/27/20 0512 09/28/20 0442 09/29/20 0502 09/30/20 0518  WBC 14.8* 13.6* 14.0* 15.6* 17.3*  NEUTROABS  --   --   --  11.2* 12.2*  HGB 7.1* 8.1* 7.7* 8.2* 8.1*  HCT 22.0* 25.1* 24.5* 26.1* 25.3*  MCV 102.8* 100.4* 104.3* 103.2* 102.4*  PLT 159 169 176 183 017   Basic Metabolic Panel: Recent Labs  Lab 09/26/20 0502 09/27/20 0512 09/28/20 0442 09/29/20 0502 09/30/20 0518  NA 127* 129* 130* 129* 128*  K 4.5 4.2 4.1 4.4 4.0  CL 98 100 100 100 98  CO2 23 25 21* 20* 21*  GLUCOSE 91 89 130* 88 86  BUN 10 12 14 14 16   CREATININE 0.36* 0.49* 0.41* 0.33* 0.35*  CALCIUM 7.3* 7.4* 7.6* 7.8* 7.7*  MG  --   --  1.7 1.6* 1.6*  PHOS  --   --  2.5 3.3 3.9   GFR: Estimated Creatinine Clearance: 146.4 mL/min (A) (by C-G formula based on SCr of 0.35 mg/dL (L)). Liver Function Tests: Recent Labs  Lab 09/25/20 0247 09/26/20 0502 09/28/20 0442 09/29/20 0502 09/30/20 0518  AST 31 28 28 28 30   ALT 8 8 8 7 8   ALKPHOS 198* 195* 235* 189* 181*  BILITOT 1.1 1.0 0.9 1.0 1.2  PROT 5.2* 5.0* 5.3* 5.3* 5.4*  ALBUMIN 1.8* 1.8* 1.9* 1.8* 1.9*   No results for input(s): LIPASE, AMYLASE in the last 168 hours. No results for input(s): AMMONIA in the last 168 hours. Coagulation Profile: Recent Labs  Lab 09/29/20 1704  INR 1.4*   Cardiac Enzymes: No results for input(s): CKTOTAL, CKMB, CKMBINDEX, TROPONINI in the last 168 hours. BNP (last 3 results) No results for input(s): PROBNP in the last 8760 hours. HbA1C: No results for  input(s): HGBA1C in the last 72 hours. CBG: No results for input(s): GLUCAP in the last 168 hours. Lipid Profile: No results for input(s): CHOL, HDL, LDLCALC, TRIG, CHOLHDL, LDLDIRECT in the last 72 hours. Thyroid Function Tests: No results for input(s): TSH, T4TOTAL, FREET4, T3FREE, THYROIDAB in the last 72 hours. Anemia Panel: No results for input(s): VITAMINB12, FOLATE, FERRITIN, TIBC, IRON, RETICCTPCT in the last 72 hours. Sepsis Labs: No results for input(s): PROCALCITON, LATICACIDVEN in the last 168 hours.  No results found for this or any previous visit (from the past 240 hour(s)).   RN Pressure Injury Documentation:     Estimated body mass index is 29.48 kg/m as calculated from the following:   Height as of this encounter: 5' 10"  (1.778 m).   Weight as of this encounter: 93.2 kg.  Malnutrition Type:  Nutrition Problem: Increased nutrient needs Etiology: chronic illness   Malnutrition Characteristics:  Signs/Symptoms: estimated needs   Nutrition Interventions:  Interventions: Juven,Boost Breeze,Prostat     Radiology Studies: US Paracentesis  Result Date: 09/30/2020 INDICATION: Patient with history of septic arthritis of the left hip with prior iliopsoas  and left proximal and medial thigh abscesses, COVID-19 in January 2022 ,staph bacteremia, alcoholic cirrhosis, ascites. Request received for diagnostic and therapeutic paracentesis up to 3 liters. EXAM: ULTRASOUND GUIDED DIAGNOSTIC AND THERAPEUTIC PARACENTESIS MEDICATIONS: 1% lidocaine to skin and subcutaneous tissue COMPLICATIONS: None immediate. PROCEDURE: Informed written consent was obtained from the patient after a discussion of the risks, benefits and alternatives to treatment. A timeout was performed prior to the initiation of the procedure. Initial ultrasound scanning demonstrates a moderate to large amount of ascites within the right mid to lower abdominal quadrant. The right mid to lower abdomen was prepped  and draped in the usual sterile fashion. 1% lidocaine was used for local anesthesia. Following this, a 19 gauge, 10-cm, Yueh catheter was introduced. An ultrasound image was saved for documentation purposes. The paracentesis was performed. The catheter was removed and a dressing was applied. The patient tolerated the procedure well without immediate post procedural complication. FINDINGS: A total of approximately 3 liters of clear, light yellow fluid was removed. Samples were sent to the laboratory as requested by the clinical team. IMPRESSION: Successful ultrasound-guided diagnostic and therapeutic paracentesis yielding 3 liters of peritoneal fluid. Read by: Rowe Robert, PA-C Electronically Signed   By: Ruthann Cancer MD   On: 09/30/2020 12:35   VAS [place] LOWER EXTREMITY VENOUS (DVT)  Result Date: 09/28/2020  Lower Venous DVT Study Indications: Possible injury to blood vessels per order in chart.  Limitations: Bandages and poor ultrasound/tissue interface. Comparison Study: no previous exams Performing Technologist: Rogelia Rohrer  Examination Guidelines: A complete evaluation includes B-mode imaging, spectral Doppler, color Doppler, and power Doppler as needed of all accessible portions of each vessel. Bilateral testing is considered an integral part of a complete examination. Limited examinations for reoccurring indications may be performed as noted. The reflux portion of the exam is performed with the patient in reverse Trendelenburg.  +-----+---------------+---------+-----------+----------+--------------+ RIGHTCompressibilityPhasicitySpontaneityPropertiesThrombus Aging +-----+---------------+---------+-----------+----------+--------------+ CFV  Full           Yes      Yes                                 +-----+---------------+---------+-----------+----------+--------------+   +---------+---------------+---------+-----------+----------+--------------+ LEFT      CompressibilityPhasicitySpontaneityPropertiesThrombus Aging +---------+---------------+---------+-----------+----------+--------------+ FV DistalFull           Yes      Yes                                 +---------+---------------+---------+-----------+----------+--------------+ POP      Full           Yes      Yes                                 +---------+---------------+---------+-----------+----------+--------------+ PTV      Full                                                        +---------+---------------+---------+-----------+----------+--------------+ PERO     Full                                                        +---------+---------------+---------+-----------+----------+--------------+  Left Technical Findings: Not visualized segments include CFV, PFV, Prox & Mid FV, SFJ. Patient just underwent I & D of large left thigh abscess yesterday. Bandaged from groin to distal thigh - unable to remove. Limited study.   Summary: RIGHT: - No evidence of common femoral vein obstruction.  LEFT: - There is no evidence of deep vein thrombosis in the lower extremity. However, portions of this examination were limited- see technologist comments above.  - No cystic structure found in the popliteal fossa.  *See table(s) above for measurements and observations. Electronically signed by Harold Barban MD on 09/28/2020 at 10:06:38 PM.    Final    Scheduled Meds: . (feeding supplement) PROSource Plus  30 mL Oral BID BM  . carvedilol  6.25 mg Oral BID WC  . Chlorhexidine Gluconate Cloth  6 each Topical Daily  . docusate sodium  100 mg Oral BID  . feeding supplement  1 Container Oral BID BM  . folic acid  1 mg Oral Daily  . furosemide  40 mg Oral Daily  . lactulose  10 g Oral BID  . lidocaine      . mouth rinse  15 mL Mouth Rinse BID  . metroNIDAZOLE  500 mg Oral Q8H  . morphine  15 mg Oral Q12H  . multivitamin with minerals  1 tablet Oral Daily  . nutrition supplement  (JUVEN)  1 packet Oral BID BM  . pantoprazole  40 mg Oral BID  . rifampin  300 mg Oral Q12H  . senna  1 tablet Oral BID  . sertraline  25 mg Oral Daily  . sodium chloride flush  5 mL Intracatheter Q8H  . sodium chloride flush  5 mL Intracatheter Q8H  . spironolactone  100 mg Oral Daily  . thiamine  100 mg Oral Daily   Continuous Infusions: .  ceFAZolin (ANCEF) IV 2 g (09/30/20 0133)  . magnesium sulfate bolus IVPB      LOS: 17 days    Kerney Elbe, DO [facility] PAGER is on [place]  If 7PM-7AM, please contact night-coverage [email or web address]

## 2020-09-30 NOTE — Progress Notes (Signed)
Occupational Therapy Treatment Patient Details Name: [name] MRN: [id number] DOB: 1985-09-19 Today's Date: 09/30/2020    History of present illness [name] is a 35 y.o. male with medical history significant of AVN of hip ( originally was scheduled for a THA this coming March2022), HTN, GERD. Recent admission for COVID , acute renal failure and alcoholic hepatitis (08/22/20- 08/29/20). He was ok for a few days after discharge, but he began to feel very tired, weak, left thigh and leg pain and progressed over the last 2 weeks. He reports that the left thigh had become increasingly swollen and tender to touch. He has had a blister rupture on the inner thigh in the groin area. These symptoms increased through 2 days ago when he was no longer ago to walk without great pain. 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 Left thigh I & D and wound vac placement 3/1. s/p I&D groin abcess by general surgery 09/23/19. Additional drain placed L thigh per IR 09/23/20.  On 09/27/2020 he underwent I&D of the left thigh again with partial closure and surgery did an I&D of the left groin abscess.  S/P paracentesis 09/30/20.  IR plans for pulling of hip/retroperitoneal drain, aspirating a small persistent fluid collection in the left iliacus, aspiration/drainage of proximal lateral thigh collection, continuing remaining drains on bulb suction - plan is for IR to do this this afternoon (09/30/20)   OT comments  Treatment focused on promoting activity out of the bed and activity tolerance in order to advance mobility and participation in self care tasks out of the bed. Encouragement and  discussion in regards to pain meds required before attempting transfer. Patient mod assist to transfer to side of bed. Patient able to tolerate sitting edge of bed with UEs propping to reduce pain and allow patient to lean back. Patient min assist to stand from elevated bed height with RW and +2 for assistance to take steps toward head of bed. Patient limited by pain but requiring less assistance. Patient reports improved pain in right shoulder and adherence to perform TB exercises. Patient mod x 2 to return to bed. Cont POC.    Follow Up Recommendations       Equipment Recommendations  None recommended by OT    Recommendations for Other Services Rehab consult    Precautions / Restrictions Precautions Precautions: Fall;Posterior Hip Precaution Comments: pre medicate for pain, multiple lines/drains Restrictions Weight Bearing Restrictions: No LLE Weight Bearing: Weight bearing as tolerated Other Position/Activity Restrictions: 2 drains in Lt anterior thigh and wound vac       Mobility Bed Mobility Overal bed mobility: Needs Assistance Bed Mobility: Supine to Sit;Sit to Supine     Supine to sit: Mod assist;+2 for safety/equipment Sit to supine: Mod assist;+2 for safety/equipment;+2 for physical assistance   General bed mobility comments: assist for lower body positioning and cues for safe technique and maintaining precautions, assist to scoot back onto bed and then bring LEs onto bed upon return to supine    Transfers Overall transfer level: Needs assistance Equipment used: Rolling walker (2 wheeled) Transfers: Sit to/from Stand Sit to Stand: From elevated surface;+2 safety/equipment;Min assist Stand pivot transfers: Mod assist;Max assist;+2 physical assistance;+2 safety/equipment  General transfer comment: assist for weight shifting and controlling descent, cues for maintaining posterior hip precautions, elevated bed for better ability to assist and pain control, pt able  to take small steps up HOB (2 ft) with min assist due to pain    Balance Overall balance assessment: Needs assistance Sitting-balance support: Feet unsupported;Bilateral upper extremity supported Sitting balance-Leahy Scale: Fair Sitting balance - Comments: UE propping due to pain   Standing balance support: Bilateral upper extremity supported Standing balance-Leahy Scale: Poor Standing balance comment: Reliance on walker                           ADL either performed or assessed with clinical judgement   ADL                                               Vision       Perception     Praxis      Cognition Arousal/Alertness: Awake/alert Behavior During Therapy: WFL for tasks assessed/performed Overall Cognitive Status: Within Functional Limits for tasks assessed                                          Exercises     Shoulder Instructions       General Comments      Pertinent Vitals/ Pain       Pain Assessment: Faces Faces Pain Scale: Hurts even more Pain Location: Lt hip Pain Descriptors / Indicators: Discomfort;Grimacing;Operative site guarding;Guarding Pain Intervention(s): Limited activity within patient's tolerance;Monitored during session;Repositioned  Home Living                                          Prior Functioning/Environment              Frequency  Min 2X/week        Progress Toward Goals  OT Goals(current goals can now be found in the care plan section)  Progress towards OT goals: Progressing toward goals  Acute Rehab OT Goals Patient Stated Goal: to be able to walk again and more independent OT Goal Formulation: With patient Time For Goal Achievement: 10/02/20 Potential to Achieve Goals: Good  Plan Discharge plan remains appropriate    Co-evaluation    PT/OT/SLP Co-Evaluation/Treatment: Yes Reason for Co-Treatment: For patient/therapist safety;Complexity of  the patient's impairments (multi-system involvement);To address functional/ADL transfers PT goals addressed during session: Mobility/safety with mobility OT goals addressed during session:  (activity tolerance, functional mobility)      AM-PAC OT "6 Clicks" Daily Activity     Outcome Measure   Help from another person eating meals?: None Help from another person taking care of personal grooming?: A Little Help from another person toileting, which includes using toliet, bedpan, or urinal?: Total Help from another person bathing (including washing, rinsing, drying)?: A Lot Help from another person to put on and taking off regular upper body clothing?: A Little Help from another person to put on and taking off regular lower body clothing?: Total 6 Click Score: 14    End of Session Equipment Utilized During Treatment: Gait belt;Rolling walker  OT Visit Diagnosis: Other  abnormalities of gait and mobility (R26.89);Muscle weakness (generalized) (M62.81);Pain Pain - Right/Left: Left Pain - part of body: Hip   Activity Tolerance Patient limited by pain   Patient Left in bed;with call bell/phone within reach;with family/visitor present   Nurse Communication Mobility status        Time: 3832-9191 OT Time Calculation (min): 29 min  Charges: OT General Charges $OT Visit: 1 Visit OT Treatments $Therapeutic Activity: 8-22 mins  Waldron Session, OTR/L Acute Care Rehab Services  Office [phone] Pager: [phone]    Kelli Churn 09/30/2020, 4:26 PM

## 2020-09-30 NOTE — Progress Notes (Signed)
3 Days Post-Op    CC: Fatigue, left leg pain  Subjective: C/o pain at IR drain site.  Going for para today.  Has to have 2mg  of dilaudid with each dressing change  Objective: Vital signs in last 24 hours: Temp:  [98.3 F (36.8 C)-99.6 F (37.6 C)] 98.6 F (37 C) (03/11 0531) Pulse Rate:  [100-121] 121 (03/11 0531) Resp:  [18] 18 (03/11 0531) BP: (114-126)/(74-81) 126/80 (03/11 0531) SpO2:  [91 %-97 %] 95 % (03/11 0531) Last BM Date: 09/28/20  Intake/Output from previous day: 03/10 0701 - 03/11 0700 In: 1040 [P.O.:840; IV Piggyback:200] Out: 2190 [Urine:2175; Drains:15] Intake/Output this shift: No intake/output data recorded.  PE: Skin: unable to see wounds currently.  He has not had pain medication and it hurts too bad to adjust dressings without this.  Will try to eval wounds today when RN does dressing change.  Lab Results:  Recent Labs    09/29/20 0502 09/30/20 0518  WBC 15.6* 17.3*  HGB 8.2* 8.1*  HCT 26.1* 25.3*  PLT 183 202    BMET Recent Labs    09/29/20 0502 09/30/20 0518  NA 129* 128*  K 4.4 4.0  CL 100 98  CO2 20* 21*  GLUCOSE 88 86  BUN 14 16  CREATININE 0.33* 0.35*  CALCIUM 7.8* 7.7*   PT/INR Recent Labs    09/29/20 1704  LABPROT 16.7*  INR 1.4*    Recent Labs  Lab 09/25/20 0247 09/26/20 0502 09/28/20 0442 09/29/20 0502 09/30/20 0518  AST 31 28 28 28 30   ALT 8 8 8 7 8   ALKPHOS 198* 195* 235* 189* 181*  BILITOT 1.1 1.0 0.9 1.0 1.2  PROT 5.2* 5.0* 5.3* 5.3* 5.4*  ALBUMIN 1.8* 1.8* 1.9* 1.8* 1.9*     Lipase     Component Value Date/Time   LIPASE 99 (H) 08/23/2020 1500     Medications: . (feeding supplement) PROSource Plus  30 mL Oral BID BM  . carvedilol  6.25 mg Oral BID WC  . Chlorhexidine Gluconate Cloth  6 each Topical Daily  . docusate sodium  100 mg Oral BID  . feeding supplement  1 Container Oral BID BM  . folic acid  1 mg Oral Daily  . furosemide  40 mg Oral Daily  . lactulose  10 g Oral BID  . mouth rinse   15 mL Mouth Rinse BID  . metroNIDAZOLE  500 mg Oral Q8H  . morphine  15 mg Oral Q12H  . multivitamin with minerals  1 tablet Oral Daily  . nutrition supplement (JUVEN)  1 packet Oral BID BM  . pantoprazole  40 mg Oral BID  . rifampin  300 mg Oral Q12H  . senna  1 tablet Oral BID  . sertraline  25 mg Oral Daily  . sodium chloride flush  5 mL Intracatheter Q8H  . sodium chloride flush  5 mL Intracatheter Q8H  . spironolactone  100 mg Oral Daily  . thiamine  100 mg Oral Daily    Assessment/Plan Symptomatic anemia   -H/H 7.7/24.5  -Transfused 3/8 Sinus tachycardia, chronic  Thrombocytopenia  - Plts 155>> 159>> 169>> 176 PMH alcoholic hepatitis Hepatic cirrhosis with ascites GAD GERD Anemia - hgb7.1  PMH AVN  Septic shock due to below on admission -now resolved s/p below procedures and IV abx MSSA bacteremia Septic arthritis of the left hip Iliopsoas and left proximal and medial thigh abscesses  09/14/20 Placement 14 F IR perc drain into left RP abscess, two  12 F drains placed in L thigh (Cx - staph) 09/15/20 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 Sena Slate 09/20/20 Left thigh irrigation and debridement with wound vac placement, Dr. Dion Saucier Incision and drainage of left groin abscess, wound VAC change left thigh 09/22/2020, Dr. Harriette Bouillon, POD #8 I&D of left groin abscess and thigh abscess 09/27/2020, Dr. Twana First, POD #3 - continue Abx per ID - cont BID dressing changes.  Outer dressing may need to be changed more frequently to prevent so much moisture build up. -agree with WOC for interdry usage given the amount of moisture present.  This was not present today when I looked at the wounds with the ACE pulled down.  FEN -regular diet VTE -none currently ID -flagyl, ancef    LOS: 17 days    Letha Cape 09/30/2020 Please see Amion

## 2020-09-30 NOTE — Procedures (Addendum)
Interventional Radiology Procedure Note  Procedure:  1) Removal of left psoas drain 2) CT guided aspiration of left iliacus abscess 3) CT guided drain placement of left anterior thigh abscess  Findings: Please refer to procedural dictation for full description.  15 mL purulent aspirate from iliacus abscess sent for culture.  12 Fr pigtail drain placed in anterior left thigh abscess with immediate yield of approximately 20 mL purulent fluid.  Placed to bulb suction  Complications: None immediate  Estimated Blood Loss: < 5 mL  Recommendations: Keep all drains to bulb suction for now. Follow up culture. IR will follow.   Marliss Coots, MD Pager: [phone]

## 2020-09-30 NOTE — Progress Notes (Signed)
Subjective:  Patient alert and oriented, sitting up in bed. States pain at left thigh pain severe this morning.   Objective:  PE: VITALS:   Vitals:   09/29/20 1346 09/29/20 2142 09/30/20 0145 09/30/20 0531  BP: 114/76 122/74 123/81 126/80  Pulse: 100 (!) 118 (!) 119 (!) 121  Resp: 18 18 18 18   Temp: 98.5 F (36.9 C) 99.6 F (37.6 C) 98.3 F (36.8 C) 98.6 F (37 C)  TempSrc: Oral Oral Oral Oral  SpO2: 97% 91% 94% 95%  Weight:      Height:       General: oriented, laying in bed Resp: no increased respiratory effort MSK:L hip incision continues to be well approximated, decreased drainage seen on bandages today. Non tender left calf. Sensation intact to all aspects of left foot. + edema of left foot. + DP pulse.Dorsiflexion/Plantar flexion intact.Dressing to left hip and left groin in place. Left groin dressing with moderate serous drainage. Closed areas from procedures continue to be well approximated.  LABS  Results for orders placed or performed during the hospital encounter of 09/13/20 (from the past 24 hour(s))  Protime-INR Once     Status: Abnormal   Collection Time: 09/29/20  5:04 PM  Result Value Ref Range   Prothrombin Time 16.7 (H) 11.4 - 15.2 seconds   INR 1.4 (H) 0.8 - 1.2  Comprehensive metabolic panel     Status: Abnormal   Collection Time: 09/30/20  5:18 AM  Result Value Ref Range   Sodium 128 (L) 135 - 145 mmol/L   Potassium 4.0 3.5 - 5.1 mmol/L   Chloride 98 98 - 111 mmol/L   CO2 21 (L) 22 - 32 mmol/L   Glucose, Bld 86 70 - 99 mg/dL   BUN 16 6 - 20 mg/dL   Creatinine, Ser 11/30/20 (L) 0.61 - 1.24 mg/dL   Calcium 7.7 (L) 8.9 - 10.3 mg/dL   Total Protein 5.4 (L) 6.5 - 8.1 g/dL   Albumin 1.9 (L) 3.5 - 5.0 g/dL   AST 30 15 - 41 U/L   ALT 8 0 - 44 U/L   Alkaline Phosphatase 181 (H) 38 - 126 U/L   Total Bilirubin 1.2 0.3 - 1.2 mg/dL   GFR, Estimated 8.84 >16 mL/min   Anion gap 9 5 - 15  CBC with Differential/Platelet     Status: Abnormal    Collection Time: 09/30/20  5:18 AM  Result Value Ref Range   WBC 17.3 (H) 4.0 - 10.5 K/uL   RBC 2.47 (L) 4.22 - 5.81 MIL/uL   Hemoglobin 8.1 (L) 13.0 - 17.0 g/dL   HCT 11/30/20 (L) 63.0 - 16.0 %   MCV 102.4 (H) 80.0 - 100.0 fL   MCH 32.8 26.0 - 34.0 pg   MCHC 32.0 30.0 - 36.0 g/dL   RDW 10.9 (H) 32.3 - 55.7 %   Platelets 202 150 - 400 K/uL   nRBC 0.1 0.0 - 0.2 %   Neutrophils Relative % 72 %   Neutro Abs 12.2 (H) 1.7 - 7.7 K/uL   Lymphocytes Relative 18 %   Lymphs Abs 3.2 0.7 - 4.0 K/uL   Monocytes Relative 6 %   Monocytes Absolute 1.1 (H) 0.1 - 1.0 K/uL   Eosinophils Relative 0 %   Eosinophils Absolute 0.1 0.0 - 0.5 K/uL   Basophils Relative 0 %   Basophils Absolute 0.1 0.0 - 0.1 K/uL   Immature Granulocytes 4 %   Abs Immature Granulocytes 0.68 (H) 0.00 -  0.07 K/uL  Phosphorus     Status: None   Collection Time: 09/30/20  5:18 AM  Result Value Ref Range   Phosphorus 3.9 2.5 - 4.6 mg/dL  Magnesium     Status: Abnormal   Collection Time: 09/30/20  5:18 AM  Result Value Ref Range   Magnesium 1.6 (L) 1.7 - 2.4 mg/dL    VAS [place] LOWER EXTREMITY VENOUS (DVT)  Result Date: 09/28/2020  Lower Venous DVT Study Indications: Possible injury to blood vessels per order in chart.  Limitations: Bandages and poor ultrasound/tissue interface. Comparison Study: no previous exams Performing Technologist: Ernestene Mention  Examination Guidelines: A complete evaluation includes B-mode imaging, spectral Doppler, color Doppler, and power Doppler as needed of all accessible portions of each vessel. Bilateral testing is considered an integral part of a complete examination. Limited examinations for reoccurring indications may be performed as noted. The reflux portion of the exam is performed with the patient in reverse Trendelenburg.  +-----+---------------+---------+-----------+----------+--------------+ RIGHTCompressibilityPhasicitySpontaneityPropertiesThrombus Aging  +-----+---------------+---------+-----------+----------+--------------+ CFV  Full           Yes      Yes                                 +-----+---------------+---------+-----------+----------+--------------+   +---------+---------------+---------+-----------+----------+--------------+ LEFT     CompressibilityPhasicitySpontaneityPropertiesThrombus Aging +---------+---------------+---------+-----------+----------+--------------+ FV DistalFull           Yes      Yes                                 +---------+---------------+---------+-----------+----------+--------------+ POP      Full           Yes      Yes                                 +---------+---------------+---------+-----------+----------+--------------+ PTV      Full                                                        +---------+---------------+---------+-----------+----------+--------------+ PERO     Full                                                        +---------+---------------+---------+-----------+----------+--------------+   Left Technical Findings: Not visualized segments include CFV, PFV, Prox & Mid FV, SFJ. Patient just underwent I & D of large left thigh abscess yesterday. Bandaged from groin to distal thigh - unable to remove. Limited study.   Summary: RIGHT: - No evidence of common femoral vein obstruction.  LEFT: - There is no evidence of deep vein thrombosis in the lower extremity. However, portions of this examination were limited- see technologist comments above.  - No cystic structure found in the popliteal fossa.  *See table(s) above for measurements and observations. Electronically signed by Coral Else MD on 09/28/2020 at 10:06:38 PM.    Final     Assessment/Plan: Left hip septic arthritis/AVN,  multiple left thigh and retroperitoneal abscesses - 2/23 - placement  of drain into left retroperitoneal abscess and two into left thigh by IR - 2/24 -left hip total arthroplasty with  cement interposition spacer - 3/1 - left thigh irrigation and debridement with wound vac placementby orthopedics, removal of left retroperitoneal drain - 3/3 - I&D of left groin abscess with change of wound vac by general surgery 3/4 - CT guided drainage of left thigh fluid collection by IR - 3/8 - left thigh repeat irrigation and debridement by orthopedics with partial closure of wound, incision and drainage of left thigh abscess/left groin abscessby general surgery - WBATLLE, continue OT and PT as able - defer to primary for pharmacologic DVT prophylaxis due to coagulopathy, continue SCD's  - continue BID dressing changes to left hip woundwith gauze and ABD's - dressing changes to left groin per general surgery - discussed with IR results of CT scan today - agree with their plan for pulling of hip/retroperitoneal drain, aspirating a small persistent fluid collection in the left iliacus, aspiration/drainage of proximal lateral thigh collection, continuing remaining drains on bulb suction - plan is for IR to do this this afternoon  Contact information:   Weekdays 8-5 Janine Ores, PA-C [phone] A fter hours and holidays please check [email or web address] for group call information for [facility]  Armida Sans 09/30/2020, 12:42 PM

## 2020-09-30 NOTE — Progress Notes (Signed)
Inpatient Rehab Admissions Coordinator:  Pt not ready for CIR admission. Will continue to follow.   Wolfgang Phoenix, MS, CCC-SLP Admissions Coordinator [phone]

## 2020-09-30 NOTE — Progress Notes (Addendum)
Referring Physician(s): Landau,J/Lama,G  Supervising Physician: Marliss CootsSuttle, Dylan  [facility]  [facility]  Chief Complaint: Left hip/thigh pain/abscesses   Subjective: Pt continues to have left thigh/hip discomfort,  abdominal distention and fatigue as well as scrotal edema; states drain in left groin region is most painful; he is undergoing paracentesis today   Allergies: Patient has no active allergies.  Medications: Prior to Admission medications   Medication Sig Start Date End Date Taking? Authorizing Provider  carvedilol (COREG) 3.125 MG tablet Take 3.125 mg by mouth 2 (two) times daily. 07/28/20  Yes [provider]  folic acid (FOLVITE) 1 MG tablet Take 1 tablet (1 mg total) by mouth daily. 08/30/20  Yes Pokhrel, Laxman, MD  guaiFENesin-dextromethorphan (ROBITUSSIN DM) 100-10 MG/5ML syrup Take 5 mLs by mouth every 4 (four) hours as needed for cough (chest congestion). Patient taking differently: Take 10 mLs by mouth every 4 (four) hours as needed for cough (chest congestion). 08/29/20  Yes Pokhrel, Laxman, MD  lactulose (CHRONULAC) 10 GM/15ML solution Take 15 mLs (10 g total) by mouth 2 (two) times daily. 08/29/20  Yes Pokhrel, Laxman, MD  magnesium oxide (MAG-OX) 400 MG tablet Take 1 tablet by mouth daily. 07/28/20  Yes [provider]  pantoprazole (PROTONIX) 40 MG tablet Take 1 tablet (40 mg total) by mouth 2 (two) times daily before a meal for 28 days, THEN 1 tablet (40 mg total) daily for 28 days. 08/29/20 10/24/20 Yes Pokhrel, Laxman, MD  prednisoLONE (ORAPRED) 15 MG/5ML solution Take 1.6-13.3 mLs by mouth See admin instructions. Take 13.653ml by mouth daily for 28 days, 10ml for 4 days, 6.396ml daily for 4 days, 3.3 daily for 4 days, then 1.556ml daily for 3 days. 08/29/20  Yes [provider]  sertraline (ZOLOFT) 25 MG tablet Take 25 mg by mouth at bedtime. 08/20/20  Yes [provider]  thiamine 100 MG tablet Take 1 tablet (100 mg total) by mouth  daily. 08/30/20  Yes Pokhrel, Laxman, MD  buPROPion (WELLBUTRIN XL) 300 MG 24 hr tablet Take 1 tablet (300 mg total) by mouth daily. Patient not taking: No sig reported 10/16/13   Rachael FeeLugo, Irving A, MD  carbamazepine (TEGRETOL XR) 200 MG 12 hr tablet Take 1 tablet (200 mg total) by mouth at bedtime. Patient not taking: No sig reported 10/16/13   Rachael FeeLugo, Irving A, MD  hydrOXYzine (ATARAX/VISTARIL) 25 MG tablet Take 1 tablet (25 mg total) by mouth every 6 (six) hours as needed for anxiety (or CIWA score </= 10). Patient not taking: No sig reported 10/16/13   Rachael FeeLugo, Irving A, MD  lisinopril-hydrochlorothiazide (ZESTORETIC) 20-25 MG tablet Take 1 tablet by mouth daily. Patient not taking: No sig reported 08/31/20   Pokhrel, Rebekah ChesterfieldLaxman, MD  oxyCODONE (ROXICODONE) 5 MG immediate release tablet Take 1 tablet (5 mg total) by mouth every 8 (eight) hours as needed. Patient not taking: No sig reported 08/29/20 08/29/21  Pokhrel, Rebekah ChesterfieldLaxman, MD  prednisoLONE 5 MG TABS tablet Take 8 tablets (40 mg total) by mouth daily for 28 days, THEN 6 tablets (30 mg total) daily for 4 days, THEN 4 tablets (20 mg total) daily for 4 days, THEN 2 tablets (10 mg total) daily for 4 days, THEN 1 tablet (5 mg total) every 3 (three) days for 1 day. Patient not taking: Reported on 09/13/2020 08/29/20 10/09/20  Joycelyn DasPokhrel, Laxman, MD  traMADol (ULTRAM) 50 MG tablet Take 1 tablet (50 mg total) by mouth every 6 (six) hours as needed. Patient not taking: Reported  on 09/13/2020 08/29/20   Joycelyn Das, MD     Vital Signs: BP 126/80 (BP Location: Right Arm)    Pulse (!) 121    Temp 98.6 F (37 C) (Oral)    Resp 18    Ht 5\' 10"  (1.778 m)    Wt 205 lb 7.5 oz (93.2 kg)    SpO2 95%    BMI 29.48 kg/m   Physical Exam awake but appears very fatigued; left thigh/groin drains remain intact, OP range from 5-20 cc serosang fluid, sites sl tender to palpation, sl more so near left groin but no visible drainage at site; heart- tachy, reg; chest- some coarse BS  bilat/dim  Imaging: SCROTUM  Result Date: 09/27/2020 CLINICAL DATA:  Swelling.  Drain placement in left thigh on 03/04. EXAM: SCROTAL ULTRASOUND DOPPLER ULTRASOUND OF THE TESTICLES TECHNIQUE: Complete ultrasound examination of the testicles, epididymis, and other scrotal structures was performed. Color and spectral Doppler ultrasound were also utilized to evaluate blood flow to the testicles. COMPARISON:  Ultrasound dated 09/17/2020 FINDINGS: Right testicle Measurements: 3 x 2.1 x 2.3 cm. No mass or microlithiasis visualized. Left testicle Measurements: 3.5 x 2.1 x 1.8 cm. No mass or microlithiasis visualized. Right epididymis:  Normal in size and appearance. Left epididymis:  Normal in size and appearance. Hydrocele: There are bilateral relatively small hydroceles, right greater than left. Varicocele:  None visualized. Pulsed Doppler interrogation of both testes demonstrates normal low resistance arterial and venous waveforms bilaterally. There is severe diffuse scrotal wall thickening, greatest on the left. IMPRESSION: 1. No testicular mass. 2. No testicular torsion. 3. Diffuse scrotal wall thickening, greatest on the left. Findings are concerning for an underlying soft tissue infection. The soft tissues are edematous without a clear drainable fluid collection. Electronically Signed   By: 09/19/2020 M.D.   On: 09/27/2020 17:06   VAS 11/27/2020 LOWER EXTREMITY VENOUS (DVT)  Result Date: 09/28/2020  Lower Venous DVT Study Indications: Possible injury to blood vessels per order in chart.  Limitations: Bandages and poor ultrasound/tissue interface. Comparison Study: no previous exams Performing Technologist: 11/28/2020  Examination Guidelines: A complete evaluation includes B-mode imaging, spectral Doppler, color Doppler, and power Doppler as needed of all accessible portions of each vessel. Bilateral testing is considered an integral part of a complete examination. Limited examinations for reoccurring  indications may be performed as noted. The reflux portion of the exam is performed with the patient in reverse Trendelenburg.  +-----+---------------+---------+-----------+----------+--------------+  RIGHT Compressibility Phasicity Spontaneity Properties Thrombus Aging  +-----+---------------+---------+-----------+----------+--------------+  CFV   Full            Yes       Yes                                    +-----+---------------+---------+-----------+----------+--------------+   +---------+---------------+---------+-----------+----------+--------------+  LEFT      Compressibility Phasicity Spontaneity Properties Thrombus Aging  +---------+---------------+---------+-----------+----------+--------------+  FV Distal Full            Yes       Yes                                    +---------+---------------+---------+-----------+----------+--------------+  POP       Full            Yes       Yes                                    +---------+---------------+---------+-----------+----------+--------------+  PTV       Full                                                             +---------+---------------+---------+-----------+----------+--------------+  PERO      Full                                                             +---------+---------------+---------+-----------+----------+--------------+   Left Technical Findings: Not visualized segments include CFV, PFV, Prox & Mid FV, SFJ. Patient just underwent I & D of large left thigh abscess yesterday. Bandaged from groin to distal thigh - unable to remove. Limited study.   Summary: RIGHT: - No evidence of common femoral vein obstruction.  LEFT: - There is no evidence of deep vein thrombosis in the lower extremity. However, portions of this examination were limited- see technologist comments above.  - No cystic structure found in the popliteal fossa.  *See table(s) above for measurements and observations. Electronically signed by Coral Else MD on 09/28/2020 at  10:06:38 PM.    Final     Labs:  CBC: Recent Labs    09/27/20 0512 09/28/20 0442 09/29/20 0502 09/30/20 0518  WBC 13.6* 14.0* 15.6* 17.3*  HGB 8.1* 7.7* 8.2* 8.1*  HCT 25.1* 24.5* 26.1* 25.3*  PLT 169 176 183 202    COAGS: Recent Labs    08/22/20 1817 08/25/20 0349 09/14/20 0525 09/15/20 2300 09/19/20 0305 09/29/20 1704  INR 1.4*   < > 1.5* 2.2* 1.5* 1.4*  APTT 42*  --   --   --   --   --    < > = values in this interval not displayed.    BMP: Recent Labs    09/27/20 0512 09/28/20 0442 09/29/20 0502 09/30/20 0518  NA 129* 130* 129* 128*  K 4.2 4.1 4.4 4.0  CL 100 100 100 98  CO2 25 21* 20* 21*  GLUCOSE 89 130* 88 86  BUN 12 14 14 16   CALCIUM 7.4* 7.6* 7.8* 7.7*  CREATININE 0.49* 0.41* 0.33* 0.35*  GFRNONAA >60 >60 >60 >60    LIVER FUNCTION TESTS: Recent Labs    09/26/20 0502 09/28/20 0442 09/29/20 0502 09/30/20 0518  BILITOT 1.0 0.9 1.0 1.2  AST 28 28 28 30   ALT 8 8 7 8   ALKPHOS 195* 235* 189* 181*  PROT 5.0* 5.3* 5.3* 5.4*  ALBUMIN 1.8* 1.9* 1.8* 1.9*    Assessment and Plan: Pt with history of left hip septic arthritis/AVNcomplicated by development of left retroperitoneal and left thigh fluid collections concerning for abscesses s/p drain placement x3 (left retroperitoneal, medial left thigh, lateral left thigh) in IR 09/14/2020; s/p removal of left retroperitoneal drain in OR 09/20/2020; with repeat CT revealing persistent left anterior thigh fluid collection concerning for abscess; underwent additional I&D of left groin and left thigh abscesses yesterday along with excisional debridement of skin, subcutaneous tissue, muscle with partial closure; afebrile, remains tachycardic, BP okay, WBC 17.3 up from 15.6, hemoglobin 8.1, creatinine 0.35; patient underwent paracentesis today yielding 3 L of clear, light yellow fluid.  Labs submitted on fluid.  We will also obtain follow-up CT abdomen pelvis as well as left femur today for evaluation of prev psoas  abscess and thigh drains  Addendum: f/u CT abdomen pelvis and femur reviewed today by Dr. Elby Showers; plans are to remove left hip drain today, aspirate left iliacus muscle fluid collection and aspirate/possibly drain proximal left lateral thigh residual fluid collection;  remaining thigh drains will be switched to JP bulb suction.  Findings discussed with Dr. Dion Saucier; due to opacities/possible pneumonia noted in distal portions of chest on CT abd will also obtain dedicated chest CT while patient in radiology today for above procedure.  Plans discussed with patient/girlfriend with their understanding and consent.   Electronically Signed: D. Jeananne Rama, PA-C 09/30/2020, 12:37 PM   I spent a total of 20 minutes at the the patient's bedside AND on the patient's hospital floor or unit, greater than 50% of which was counseling/coordinating care for left thigh/hip abscess drains    Patient ID: [name], male   DOB: 20-Sep-1985, 35 y.o.   MRN: [id number]

## 2020-09-30 NOTE — Procedures (Signed)
Ultrasound-guided diagnostic and therapeutic paracentesis performed yielding 3 liters (maximum ordered) of clear, light yellow fluid. No immediate complications.  A portion of the fluid was submitted to the lab for preordered studies.EBL none.

## 2020-09-30 NOTE — Progress Notes (Signed)
Pt refuses ambulation, insists on bedpan. Small, early signs of MASD noted on bilatral buttocks. Area cleansed, barrier cream applied. Sacral foam in place.  Education provided to pt about importance of ambulation in healing and prevention of skin breakdown. Pt verbalized understanding.

## 2020-09-30 NOTE — Progress Notes (Signed)
Physical Therapy Treatment Patient Details Name: [name] MRN: [id number] DOB: 1986/01/08 Today's Date: 09/30/2020    History of Present Illness [name] is a 35 y.o. male with medical history significant of AVN of hip ( originally was scheduled for a THA this coming March2022), HTN, GERD. Recent admission for COVID , acute renal failure and alcoholic hepatitis (08/22/20- 08/29/20). He was ok for a few days after discharge, but he began to feel very tired, weak, left thigh and leg pain and progressed over the last 2 weeks. He reports that the left thigh had become increasingly swollen and tender to touch. He has had a blister rupture on the inner thigh in the groin area. These symptoms increased through 2 days ago when he was no longer ago to walk without great pain. 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 Left thigh I & D and wound vac placement 3/1. s/p I&D groin abcess by general surgery 09/23/19. Additional drain placed L thigh per IR 09/23/20.  On 09/27/2020 he underwent I&D of the left thigh again with partial closure and surgery did an I&D of the left groin abscess.  S/P paracentesis 09/30/20.  IR plans for pulling of hip/retroperitoneal drain, aspirating a small persistent fluid collection in the left iliacus, aspiration/drainage of proximal lateral thigh collection, continuing remaining drains on bulb suction - plan is for IR to do this this afternoon (09/30/20)    PT Comments    Pt premedicated for session and required a little encouragement to participate.  Significant other present and also providing encouragement during session.  Pt assisted to sitting EOB  and then standing.  Pt able to take a couple steps up [facility] with great difficulty due to pain.  Pt requires cues for maintaining posterior hip precautions with mobility as well.  Continue to recommend CIR upon d/c.   Follow Up Recommendations  CIR     Equipment Recommendations  Rolling walker with 5" wheels;3in1 (PT)    Recommendations for Other Services       Precautions / Restrictions Precautions Precautions: Fall;Posterior Hip Precaution Comments: pre medicate for pain, multiple lines/drains Restrictions LLE Weight Bearing: Weight bearing as tolerated    Mobility  Bed Mobility Overal bed mobility: Needs Assistance Bed Mobility: Supine to Sit;Sit to Supine     Supine to sit: Mod assist;+2 for physical assistance;+2 for safety/equipment Sit to supine: Mod assist;+2 for physical assistance;+2 for safety/equipment   General bed mobility comments: assist for lower body positioning and cues for safe technique and maintaining precautions, assist to scoot back onto bed and then bring LEs onto bed upon return to supine    Transfers Overall transfer level: Needs assistance Equipment used: Rolling walker (2 wheeled) Transfers: Sit to/from Stand Sit to Stand: From elevated surface;+2 safety/equipment;Min assist         General transfer comment: assist for weight shifting and controlling descent, cues for maintaining posterior hip precautions, elevated bed for better ability to assist and pain control, pt able to take small steps up HOB (2 ft) with min assist due to pain  Ambulation/Gait                 Stairs  Wheelchair Mobility    Modified Rankin (Stroke Patients Only)       Balance                                            Cognition Arousal/Alertness: Awake/alert Behavior During Therapy: WFL for tasks assessed/performed Overall Cognitive Status: Within Functional Limits for tasks assessed                                         Exercises      General Comments        Pertinent Vitals/Pain Pain Assessment: Faces Faces Pain Scale: Hurts even more Pain Location: Lt hip Pain Descriptors / Indicators: Discomfort;Grimacing;Operative site guarding;Guarding Pain Intervention(s): Premedicated before session;Repositioned;Monitored during session    Home Living                      Prior Function            PT Goals (current goals can now be found in the care plan section) Acute Rehab PT Goals PT Goal Formulation: With patient Time For Goal Achievement: 10/14/20 Potential to Achieve Goals: Good Progress towards PT goals: Progressing toward goals    Frequency    Min 4X/week      PT Plan Current plan remains appropriate    Co-evaluation PT/OT/SLP Co-Evaluation/Treatment: Yes Reason for Co-Treatment: For patient/therapist safety;To address functional/ADL transfers PT goals addressed during session: Mobility/safety with mobility OT goals addressed during session: ADL's and self-care      AM-PAC PT "6 Clicks" Mobility   Outcome Measure  Help needed turning from your back to your side while in a flat bed without using bedrails?: A Lot Help needed moving from lying on your back to sitting on the side of a flat bed without using bedrails?: A Lot Help needed moving to and from a bed to a chair (including a wheelchair)?: A Lot Help needed standing up from a chair using your arms (e.g., wheelchair or bedside chair)?: A Lot Help needed to walk in hospital room?: Total Help needed climbing 3-5 steps with a railing? : Total 6 Click Score: 10    End of Session   Activity Tolerance: Patient limited by pain Patient left: in bed;with call bell/phone within reach;with family/visitor present;with nursing/sitter in room   PT Visit Diagnosis: Other abnormalities of gait and mobility (R26.89);Muscle weakness (generalized) (M62.81);Pain Pain - Right/Left: Left Pain - part of  body: Hip     Time: 7412-8786 PT Time Calculation (min) (ACUTE ONLY): 27 min  Charges:  $Therapeutic Activity: 8-22 mins                    Thomasene Mohair PT, DPT Acute Rehabilitation Services Pager: [phone] Office: [phone]  Maida Sale E 09/30/2020, 3:54 PM

## 2020-10-01 DIAGNOSIS — R7881 Bacteremia: Secondary | ICD-10-CM | POA: Diagnosis not present

## 2020-10-01 DIAGNOSIS — F1029 Alcohol dependence with unspecified alcohol-induced disorder: Secondary | ICD-10-CM | POA: Diagnosis not present

## 2020-10-01 DIAGNOSIS — K7010 Alcoholic hepatitis without ascites: Secondary | ICD-10-CM | POA: Diagnosis not present

## 2020-10-01 DIAGNOSIS — K6812 Psoas muscle abscess: Secondary | ICD-10-CM | POA: Diagnosis not present

## 2020-10-01 LAB — COMPREHENSIVE METABOLIC PANEL
ALT: 7 U/L (ref 0–44)
AST: 26 U/L (ref 15–41)
Albumin: 2.1 g/dL — ABNORMAL LOW (ref 3.5–5.0)
Alkaline Phosphatase: 152 U/L — ABNORMAL HIGH (ref 38–126)
Anion gap: 8 (ref 5–15)
BUN: 9 mg/dL (ref 6–20)
CO2: 24 mmol/L (ref 22–32)
Calcium: 7.6 mg/dL — ABNORMAL LOW (ref 8.9–10.3)
Chloride: 96 mmol/L — ABNORMAL LOW (ref 98–111)
Creatinine, Ser: 0.35 mg/dL — ABNORMAL LOW (ref 0.61–1.24)
GFR, Estimated: >60 mL/min (ref >60)
Glucose, Bld: 91 mg/dL (ref 70–99)
Potassium: 3.7 mmol/L (ref 3.5–5.1)
Sodium: 128 mmol/L — ABNORMAL LOW (ref 135–145)
Total Bilirubin: 1 mg/dL (ref 0.3–1.2)
Total Protein: 5.2 g/dL — ABNORMAL LOW (ref 6.5–8.1)

## 2020-10-01 LAB — CBC WITH DIFFERENTIAL/PLATELET
Abs Immature Granulocytes: 0.29 10*3/uL — ABNORMAL HIGH (ref 0.00–0.07)
Basophils Absolute: 0 10*3/uL (ref 0.0–0.1)
Basophils Relative: 0 %
Eosinophils Absolute: 0.1 10*3/uL (ref 0.0–0.5)
Eosinophils Relative: 1 %
HCT: 23.5 % — ABNORMAL LOW (ref 39.0–52.0)
Hemoglobin: 7.5 g/dL — ABNORMAL LOW (ref 13.0–17.0)
Immature Granulocytes: 2 %
Lymphocytes Relative: 18 %
Lymphs Abs: 2.5 10*3/uL (ref 0.7–4.0)
MCH: 32.5 pg (ref 26.0–34.0)
MCHC: 31.9 g/dL (ref 30.0–36.0)
MCV: 101.7 fL — ABNORMAL HIGH (ref 80.0–100.0)
Monocytes Absolute: 1 10*3/uL (ref 0.1–1.0)
Monocytes Relative: 7 %
Neutro Abs: 10.5 10*3/uL — ABNORMAL HIGH (ref 1.7–7.7)
Neutrophils Relative %: 72 %
Platelets: 201 10*3/uL (ref 150–400)
RBC: 2.31 MIL/uL — ABNORMAL LOW (ref 4.22–5.81)
RDW: 18.7 % — ABNORMAL HIGH (ref 11.5–15.5)
WBC: 14.4 10*3/uL — ABNORMAL HIGH (ref 4.0–10.5)
nRBC: 0 % (ref 0.0–0.2)

## 2020-10-01 LAB — PHOSPHORUS: Phosphorus: 3.8 mg/dL (ref 2.5–4.6)

## 2020-10-01 LAB — MAGNESIUM: Magnesium: 1.7 mg/dL (ref 1.7–2.4)

## 2020-10-01 MED ORDER — IPRATROPIUM BROMIDE 0.02 % IN SOLN
0.5000 mg | Freq: Four times a day (QID) | RESPIRATORY_TRACT | Status: DC | PRN
  Administered 2020-10-02: 0.5 mg via RESPIRATORY_TRACT
  Filled 2020-10-01: qty 2.5

## 2020-10-01 MED ORDER — METOPROLOL TARTRATE 25 MG PO TABS
25.0000 mg | ORAL_TABLET | Freq: Two times a day (BID) | ORAL | Status: DC
  Administered 2020-10-01 – 2020-10-02 (×3): 25 mg via ORAL
  Filled 2020-10-01 (×3): qty 1

## 2020-10-01 MED ORDER — SERTRALINE HCL 50 MG PO TABS
50.0000 mg | ORAL_TABLET | Freq: Every day | ORAL | Status: DC
  Administered 2020-10-02 – 2020-10-10 (×9): 50 mg via ORAL
  Filled 2020-10-01 (×9): qty 1

## 2020-10-01 MED ORDER — LEVALBUTEROL HCL 0.63 MG/3ML IN NEBU
0.6300 mg | INHALATION_SOLUTION | Freq: Four times a day (QID) | RESPIRATORY_TRACT | Status: DC
  Administered 2020-10-01: 14:00:00 0.63 mg via RESPIRATORY_TRACT
  Filled 2020-10-01 (×2): qty 3

## 2020-10-01 MED ORDER — HYDROCORTISONE 1 % EX CREA
TOPICAL_CREAM | Freq: Two times a day (BID) | CUTANEOUS | Status: DC
  Filled 2020-10-01: qty 28

## 2020-10-01 MED ORDER — LEVALBUTEROL HCL 0.63 MG/3ML IN NEBU
0.6300 mg | INHALATION_SOLUTION | Freq: Four times a day (QID) | RESPIRATORY_TRACT | Status: DC | PRN
  Filled 2020-10-01: qty 3

## 2020-10-01 MED ORDER — GUAIFENESIN ER 600 MG PO TB12
1200.0000 mg | ORAL_TABLET | Freq: Two times a day (BID) | ORAL | Status: DC
  Administered 2020-10-01 – 2020-10-10 (×19): 1200 mg via ORAL
  Filled 2020-10-01 (×19): qty 2

## 2020-10-01 MED ORDER — CEFAZOLIN SODIUM-DEXTROSE 2-4 GM/100ML-% IV SOLN
2.0000 g | Freq: Three times a day (TID) | INTRAVENOUS | Status: DC
  Administered 2020-10-01 – 2020-10-10 (×27): 2 g via INTRAVENOUS
  Filled 2020-10-01 (×28): qty 100

## 2020-10-01 MED ORDER — IPRATROPIUM BROMIDE 0.02 % IN SOLN
0.5000 mg | Freq: Four times a day (QID) | RESPIRATORY_TRACT | Status: DC
  Administered 2020-10-01: 0.5 mg via RESPIRATORY_TRACT
  Filled 2020-10-01: qty 2.5

## 2020-10-01 MED ORDER — MAGNESIUM SULFATE 2 GM/50ML IV SOLN
2.0000 g | Freq: Once | INTRAVENOUS | Status: AC
  Administered 2020-10-01: 2 g via INTRAVENOUS
  Filled 2020-10-01: qty 50

## 2020-10-01 NOTE — Progress Notes (Signed)
PROGRESS NOTE    [name]  NTI:[id number] DOB: [birth year] DOA: 09/13/2020 PCP: [name], PA   Brief Narrative:  The patient is a 35 year old male with past medical history of AVN of the hip, hypertension, GERD he was recently admitted on 08/22/2020 for COVID-19 and was discharged on 08/29/2020. 2 days after discharge patient began to feel extremely weak and tired with symptoms progressing over next 2 weeks. Patient developed left thigh rash and leg pain with swelling and tenderness to touch. Patient was seen at PCP office and was told to go to the ED immediately. He was found to have left iliopsoas and left gluteal abscess with diffuse swelling and left hip effusion on CT scan. General surgery was consulted. Blood cultures positive for MSSA bacteremia. He underwent drainage of the abscess with 3 drain placement by IR in his iliopsoas muscle and multiple abscesses in the legs. On 09/15/2020 had left hip interposition arthroplasty with antibiotic spacer placement and debridement and developed decompensated septic shock with refractory hypotension despite 2 units PRBC, 2.2 L of crystalloid and 500 cc of albumin. He was admitted to ICU with vasopressor support. ID has been consulted and recommend 6 weeks of IV cefazolin.Patient also had scrotal edema anasarca in the setting of IV fluid resuscitation. Ultrasound was unremarkable for abscess but showed marked asymmetric left scrotal wall thickening and fluid with increased blood flow consistent with soft tissue infection.  Patient had multiple incision and drainage done by general surgery and also multiple aspiration of the abscesses with drain placement per IR.  He continues to need abscess drainage and wound VAC placement as per general surgery, Ortho and IR.  Because his abdomen was more distended and CT scan of the abdomen pelvis done last week showed moderate to large ascites we will consult GI.  I discussed with them about obtaining  a ultrasound of the abdomen and they will order also ordered a paracentesis.  Patient was given a dose of IV Lasix given his anasarca now.  GI has started the patient on a 2 g sodium diet and also started the patient on Lasix 40 minutes p.o. daily as well as Aldactone 100 g p.o. daily and follow his renal function carefully for the next few days.  Paracentesis been ordered and they are recommending full labs with cell count, cytology, protein, LDH and albumin and recommendations for weigh the patient daily.  Patient underwent his paracentesis on 09/30/2020 today drains started putting out less output interventional radiology is going to obtain a follow-up CT of the abdomen pelvis as well as a left femur today for evaluation of his previous for sepsis and by drain.  Assessment & Plan:   Principal Problem:   MSSA bacteremia Active Problems:   Alcohol dependence ([place])   Alcoholic hepatitis   Sepsis ([place])   Septic arthritis ([place])   Iliopsoas abscess on left ([place])  Iliopsoas and left gluteal abscesses associated with MSSA Bacteremia and Groin Abscess Septic shock secondary to above and left hip septic arthritis and MSSA bacteremia Left hip effusion and septic arthritis in the setting of avascular necrosis -Admitted to Inpatient Telemetry  -Initially started on IV vanc, cefepime; pharmacy to dose but changed to IV Cefazolin by ID -Repeat Blood Cx x2 and obtain ECHO; See below -Initial Blood Cx 09/13/20 showed Staph Aureus in Both sets of Cx's -Fluids had initially stopped but then he received 2.2 crystalloid boluses as well as 2 units of PRBCs postoperatively -Sepsis Criteria met with: tachycardia, tachypnea, lactic  acid 3.3, and Leukocytosis was 10.6 source: abscesses as above as well as bacteremia and went into septic shock during this admission -Procalcitonin is improved -WBC was trending up and trended up to 17.3 and is now 14.4 -General surgery recs IR involvement for possible abscess drainage  and for ortho to review ([facility] consulted IR, EDP has consulted ortho); appreciate assistance -Interventional radiology 3 drains in place today via CT-guided placement with a 14 French drainage catheter into the left retroperitoneal abscess into 12 French drains in the medial and lateral abscesses with the anterior aspect of left thigh and there is a total of 1.3 L of purulent fluid that was aspirated following drain placement sent for evaluation -Interventional Radiology evaluated the drains and they are recommending continue drain management and continue every shift flushes and monitoring of output; he is going to go to the OR today and if the drains are remain and the following plan for this is to repeat a CT scan when his drain output is less than 10 cc/day to assess for possible removal however repeat CT was done below -Drain Gram Stain showing Moderate WBC, Abundant Gram Positive Cocci, and Abundant Gram Negative Rods with Cultures showing abundant Staph Aureus which is pan sensitive -Infectious disease is now recommending a prolonged 6-week course of IV cefazolin and he will need oral medications after for extension and they recommend that is okay to place a PICC but they are recommending to wait there is ongoing abscess and drainage could also consider rifampin at some point. -Infectious diseases consulted and recommending changing antibiotic to cefazolin and following repeat echocardiogram; ECHO as below did not mention any vegetation; infectious diseases is now recommending the addition of Flagyl -Repeat blood cultures have been done and show NGTD at 5 Days; since if TTE is negative they can recommend a transesophageal echocardiogram then but since his TTE was a difficult Study, ID Dr. Juleen China recommending TEE given Disseminated MSSA infection so he underwent a transesophageal echocardiogram on Monday, 09/19/2020. -Patient underwent a repeat CT scan of the abdomen pelvis on 09/21/20 as well as the femur  and showed " Interval placement of left pelvic and two left thigh drains for multiple abscesses. The pelvic components of the fluid collections have mildly improved, but there are multiple persistent large peripherally enhancing  complex fluid collections within the left thigh which are incompletely drained. These are quite extensive in length, extending into the distal thigh. The proximal component of the anteromedial collection has  slightly enlarged. No evidence of osteomyelitis or large hip joint effusion. 3. Interval left total hip arthroplasty. The hardware appears well positioned." -Because of this orthopedic surgery reviewed the CT scan and I feel the best course of action is to take the patient for an open irrigation debridement of the medial and lateral thigh to express these fluid collections he went back to the OR on 09/21/2020 as well as 09/27/2020 neurosurgery was also consulted given that infection had spread into the left groin -IR recommending Repeating CT Abd/Pelvis and Femur 09/30/20 because WBC is also going up and was done yesterday persistent undrained left cardiac cath and left anterolateral intramuscular fluid collections with peripheral enhancement concerning for abscesses that were amenable to CT-guided aspiration: CT-guided aspiration and drainage placement was done -CT scan also showed resolution of the psoas abscess drain after placement and there is persistent small peripheral enhancing fluid collections around the indwelling anterior thigh drains -a CT scan of the abdomen and pelvis also did note the patient  had interval development of groundglass and consolidative opacities in the mid lungs bilaterally and a CT of the chest was dedicated to this and done and showed patchy bilateral upper lobe predominant airspace disease consistent with multifocal pneumonia Plan we will continue with antibiotics for now but patient did spike a temperature this afternoon so we will repeat his blood  cultures and reconsult ID for further evaluation -LE Venous Duplex repeated and showed no DVT but portions were limited  -General surgery is recommending continue antibiotics as well as dressing changes twice daily -Continue PT OT evaluation and continue dressing changes per orthopedic surgery and VT prophylaxis further recommendations but currently this is held given his anemia and thrombocytopenia -PT/OT recommending CIR but once he is only cleared from a medical perspective and once orthopedic surgery signed off the case and has no further debridement plans  Chronic Pain -pain is not controlled, will start MS Contin 15 mg p.o. twice daily, discontinue oxycodone.   -We will continue with Dilaudid 1 mg IV every 4 hours as needed for breakthrough pain  Symptomatic Anemia Macrocytic Anemia -Presented with Hgb/Hct of 6.2/18.9 -Patient is status post 8 units of PRBCs total  -The patient's hemoglobin/hematocrit is now 8.2/26.1 the day before yesterday and yesterday it was 8.1/25.3 and today it is 7.5/23.5 -FOBT negative -Anemia panel done and showed an iron level of 44, U IBC 131, TIBC 175, saturation ratios of 25% -check LDH and was 308, haptoglobin pending, iron studies showed an iron level of 44, U IBC 131, TIBC 175, saturation ratios of 25% -Continue to monitor for signs and symptoms of bleeding; currently no overt bleeding noted  -repeat CBC in a.m.  Hx of Alcoholic Hepatitis and alcohol abuse Hepatic Cirrhosis ascites with anasarca and volume overload Hyperbilirubinemia, was improving and now worsening  -Was continiung Prednisolone but this was discontinued due to disseminated MSSA bacteremia. -LFTs have normalized and so has his T bili but he continues to have some moderate ascites to large ascites noted on his CT scan and is more distended today with his anasarca in the setting of his hepatic cirrhosis -Patient and family report he has been alcohol free for 6 weeks -Continue  lactulose 10 g p.o. twice daily, thiamine, magnesium -abdomen appears more distended appear more fluid-filled today -Will have GI weigh in for addition of spironolactone and furosemide and defer to them to obtain a ultrasound of the abdomen to evaluate his ascites as patient may need a paracentesis and fluid studies -GI have asked IR to do a paracentesis and this was done on 09/30/2020 with removal of 3 L maximum and fluid was sent for analysis studies; fluid analysis studies were not consistent with SBP -GI is also added the patient on Lasix 40 mg p.o. daily and spironolactone 100 g p.o. daily and will need to continue monitor his renal function closely -May need to more diuresis given his anasarca and his given albumin yesterday -INR was 1.4 -Continue monitor hepatic function daily and has bilirubin and his LFTs are normalized  Hx of Tachycardia; Now in Sinus Tachycardia intermittently HTN  -Baseline heart rate is anywhere between 100-110 per family -We will change carvedilol 6.25 mg p.o. twice daily to Metoprolol tartrate 25 mg p.o. twice daily  GAD -Continue sertraline 25 mg p.o. nightly but will increase the dose to 50 mg p.o. nightly  GERD -C/w Pantoprazole 40 mg p.o. twice daily and will continue for now   Hyponatremia -Initiated on IV fluid when he was in septic  shock but now this to stop and he was given diuresis -Sodium is now 130 and 84 yesterday and today is again 128 -GI has now added 40 mg of p.o. Lasix as well as 100 mg spironolactone p.o. daily -Continue to monitor and trend and repeat CMP in a.m.  Metabolic Acidosis -Mild  -Patient CO2 is now  21, anion gap is  9, chloride level is 98 -We have stopped his IV fluid hydration and he was given a dose of IV Lasix yesterday -Continue to monitor and trend and repeat CMP in a.m.  Hypophosphatemia -Patient's phosphorus level is now gone from 2.5 and is now three-point -Continue to monitor and replete as  necessary -Repeat phosphorus level in a.m.  Hypomagnesemia -Patient magnesium was 1.7  -Replete with IV mag sulfate 2 g again today -Continue to monitor and replete as necessary -Repeat Mag Level in the AM   Scrotal Edema and Anasarca, persistent -Patient was positive for 6 L however had a paracentesis done yesterday and is now being diuresed and is now -1.945 L since admission -In the setting of his hepatic cirrhosis and IV fluid resuscitation as well as his infection; now has a lot of ascites -Stopped IV fluids and recommend scrotal elevation and sling and obtain a scrotal ultrasound and Doppler to rule out any pathology -Initial scrotal U/S done and showed "No evidence of testicular mass or torsion. Marked asymmetric left scrotal wall thickening and fluid, with increased blood flow. This is likely due to soft tissue infection, and part of the much more widespread process in the left pelvis and thigh demonstrated on prior CT." -Foley catheter has been removed but now has been placed back in -Repeat ultrasound done yesterday was concerning for soft tissue infection but urology evaluated and appreciate Dr. Lynne Logan evaluation and feels that this appears to be related to the regional edema from his leg and inguinal infection as well as edema.  There is no abscess on recent ultrasound and no evidence of scrotal skin infection and they recommended continue to monitor observe and recommending continuing for a long is a difficult for the patient ambulate for using urinal -Continue with Extremity Elevation and will give a dose of IV Lasix yesterday and he has p.o. Lasix and Aldactone added by gastroenterology  Thrombocytopenia  -Patient's platelet count has now normalized and is 201,000 now -Continue to monitor for signs and symptoms of bleeding; currently no overt bleeding noted -Repeat CBC in a.m.  DVT prophylaxis: SCDs Code Status: FULL CODE Family Communication: Discussed with Girlfriend at  bedside  Disposition Plan: Pending further clinical improvement will go to CIR  Status is: Inpatient  Remains inpatient appropriate because:Unsafe d/c plan, IV treatments appropriate due to intensity of illness or inability to take PO and Inpatient level of care appropriate due to severity of illness   Dispo: The patient is from: Home              Anticipated d/c is to: CIR              Patient currently is not medically stable to d/c.   Difficult to place patient No  Consultants:   General Surgery  Urology  Orthopedic Surgery  Interventional Radiology  Infectious Diseases   Gastroenterology    Procedures:  TOTAL HIP ARTHROPLASTY WITH CEMENT INTERPOSITION SPACINGfor septic hip arthritis from septicemia in the setting of alcoholic AVN of the left hip  Drainage of the abscess with 3 drain placement by IR in iliopsoas muscle and  multiple abscesses in the legs  PROCEDURE by Dr. Mardelle Matte on 09/27/20: Left thigh irrigation and debridement, excisional debridement, skin, subcutaneous tissue, muscle with partial closure  Procedure: Incision and drainage of left groin abscess and left thigh abscess done by Dr. Romana Juniper on 09/27/20  Paracentesis done on 09/30/2020 performed yielding 3 liters (maximum ordered) of clear, light yellow fluid. No immediate complications.  A portion of the fluid was submitted to the lab for preordered studies.EBL none.   Antimicrobials:  Anti-infectives (From admission, onward)   Start     Dose/Rate Route Frequency Ordered Stop   10/01/20 2200  ceFAZolin (ANCEF) IVPB 2g/100 mL premix        2 g 200 mL/hr over 30 Minutes Intravenous Every 8 hours 10/01/20 1602     09/29/20 1700  rifampin (RIFADIN) capsule 300 mg        300 mg Oral Every 12 hours 09/29/20 1131     09/26/20 1300  rifampin (RIFADIN) 300 mg in sodium chloride 0.9 % 100 mL IVPB  Status:  Discontinued        300 mg 200 mL/hr over 30 Minutes Intravenous Every 12 hours 09/26/20 1156 09/29/20  1131   09/18/20 1400  metroNIDAZOLE (FLAGYL) tablet 500 mg        500 mg Oral Every 8 hours 09/18/20 0927     09/15/20 1715  tobramycin (NEBCIN) powder  Status:  Discontinued          As needed 09/15/20 1715 09/15/20 2043   09/15/20 1715  vancomycin (VANCOCIN) powder  Status:  Discontinued          As needed 09/15/20 1716 09/15/20 2043   09/14/20 0600  ceFAZolin (ANCEF) IVPB 2g/100 mL premix  Status:  Discontinued        2 g 200 mL/hr over 30 Minutes Intravenous Every 8 hours 09/14/20 0359 10/01/20 1602   09/13/20 2200  vancomycin (VANCOREADY) IVPB 1500 mg/300 mL  Status:  Discontinued        1,500 mg 150 mL/hr over 120 Minutes Intravenous Every 12 hours 09/13/20 1636 09/14/20 0359   09/13/20 1500  ceFEPIme (MAXIPIME) 2 g in sodium chloride 0.9 % 100 mL IVPB  Status:  Discontinued        2 g 200 mL/hr over 30 Minutes Intravenous Every 8 hours 09/13/20 1437 09/14/20 0359   09/13/20 1130  vancomycin (VANCOREADY) IVPB 1750 mg/350 mL        1,750 mg 175 mL/hr over 120 Minutes Intravenous  Once 09/13/20 1107 09/13/20 1414   09/13/20 1045  ceFAZolin (ANCEF) IVPB 1 g/50 mL premix        1 g 100 mL/hr over 30 Minutes Intravenous  Once 09/13/20 1031 09/13/20 1307       Subjective: Seen and examined at bedsid and he is feeling depressed.  Still complains of some pain but thinks his dressing changes were easier now.  No nausea or vomiting.  Has been coughing, intermittently but not as much.  Still feels swollen but the abdomen feels less tight after the paracentesis.  Denies any other concerns or plans at this time requesting a trapeze bar.  No other concerns or complaints at this time.  Objective: Vitals:   10/01/20 0500 10/01/20 0953 10/01/20 1316 10/01/20 1337  BP:  116/71 106/69   Pulse:  (!) 121 (!) 112   Resp:  16 16   Temp:  (!) 100.8 F (38.2 C) (!) 100.4 F (38 C)   TempSrc:  Oral Oral  SpO2:  90% 93% 96%  Weight: 92.4 kg     Height:        Intake/Output Summary (Last 24  hours) at 10/01/2020 1653 Last data filed at 10/01/2020 1600 [place] per 24 hour  Intake 505 ml  Output 1385 ml  Net -880 ml   Filed Weights   09/21/20 0500 09/28/20 0545 10/01/20 0500  Weight: 93.9 kg 93.2 kg 92.4 kg   Examination: Physical Exam:  Constitutional: WN/WD overweight Caucasian male currently in no acute distress but appears uncomfortable while and does appear somewhat depressed Eyes: Lids and conjunctivae normal, sclerae anicteric  ENMT: External Ears, Nose appear normal. Grossly normal hearing.  Neck: Appears normal, supple, no cervical masses, normal ROM, no appreciable thyromegaly; no appreciable JVD Respiratory: Diminished to auscultation bilaterally with coarse breath sounds bilaterally and some rhonchi, no wheezing, rales, rhonchi or crackles. Normal respiratory effort and patient is not tachypenic. No accessory muscle use.  Not wearing supplemental oxygen via nasal cannula Cardiovascular: Tachycardic rate but regular rhythm, no murmurs / rubs / gallops. S1 and S2 auscultated.  Has 1-2+ lower extremity pitting edema Abdomen: Soft, non-tender, distended secondary body habitus. Bowel sounds positive.  GU: Deferred. Musculoskeletal: No clubbing / cyanosis of digits/nails.  Has multiple drains in the left leg Skin: I did not view his surgical wounds today. No induration; Warm and dry.  Neurologic: CN 2-12 grossly intact with no focal deficits. Romberg sign and cerebellar reflexes not assessed.  Psychiatric: Normal judgment and insight. Alert and oriented x 3.  Depressed appearing mood and appropriate affect.   Data Reviewed: I have personally reviewed following labs and imaging studies  CBC: Recent Labs  Lab 09/27/20 0512 09/28/20 0442 09/29/20 0502 09/30/20 0518 10/01/20 0537  WBC 13.6* 14.0* 15.6* 17.3* 14.4*  NEUTROABS  --   --  11.2* 12.2* 10.5*  HGB 8.1* 7.7* 8.2* 8.1* 7.5*  HCT 25.1* 24.5* 26.1* 25.3* 23.5*  MCV 100.4* 104.3* 103.2* 102.4* 101.7*  PLT 169  176 183 202 675   Basic Metabolic Panel: Recent Labs  Lab 09/27/20 0512 09/28/20 0442 09/29/20 0502 09/30/20 0518 10/01/20 0537  NA 129* 130* 129* 128* 128*  K 4.2 4.1 4.4 4.0 3.7  CL 100 100 100 98 96*  CO2 25 21* 20* 21* 24  GLUCOSE 89 130* 88 86 91  BUN 12 14 14 16 9   CREATININE 0.49* 0.41* 0.33* 0.35* 0.35*  CALCIUM 7.4* 7.6* 7.8* 7.7* 7.6*  MG  --  1.7 1.6* 1.6* 1.7  PHOS  --  2.5 3.3 3.9 3.8   GFR: Estimated Creatinine Clearance: 145.9 mL/min (A) (by C-G formula based on SCr of 0.35 mg/dL (L)). Liver Function Tests: Recent Labs  Lab 09/26/20 0502 09/28/20 0442 09/29/20 0502 09/30/20 0518 10/01/20 0537  AST 28 28 28 30 26   ALT 8 8 7 8 7   ALKPHOS 195* 235* 189* 181* 152*  BILITOT 1.0 0.9 1.0 1.2 1.0  PROT 5.0* 5.3* 5.3* 5.4* 5.2*  ALBUMIN 1.8* 1.9* 1.8* 1.9* 2.1*   No results for input(s): LIPASE, AMYLASE in the last 168 hours. No results for input(s): AMMONIA in the last 168 hours. Coagulation Profile: Recent Labs  Lab 09/29/20 1704  INR 1.4*   Cardiac Enzymes: No results for input(s): CKTOTAL, CKMB, CKMBINDEX, TROPONINI in the last 168 hours. BNP (last 3 results) No results for input(s): PROBNP in the last 8760 hours. HbA1C: No results for input(s): HGBA1C in the last 72 hours. CBG: No results for input(s): GLUCAP in the  last 168 hours. Lipid Profile: No results for input(s): CHOL, HDL, LDLCALC, TRIG, CHOLHDL, LDLDIRECT in the last 72 hours. Thyroid Function Tests: No results for input(s): TSH, T4TOTAL, FREET4, T3FREE, THYROIDAB in the last 72 hours. Anemia Panel: No results for input(s): VITAMINB12, FOLATE, FERRITIN, TIBC, IRON, RETICCTPCT in the last 72 hours. Sepsis Labs: No results for input(s): PROCALCITON, LATICACIDVEN in the last 168 hours.  Recent Results (from the past 240 hour(s))  Body fluid culture w Gram Stain     Status: None (Preliminary result)   Collection Time: 09/30/20 12:55 PM   Specimen: Peritoneal Washings; Body Fluid   Result Value Ref Range Status   Specimen Description   Final    PERITONEAL Performed at [facility] [address], [place], [place] [address]    Special Requests   Final    Normal Performed at [facility] [place] [address] [place], [place] [address]    Gram Stain NO WBC SEEN NO ORGANISMS SEEN   Final   Culture   Final    NO GROWTH < 12 HOURS Performed at [facility], [place] [address], [place], [place] [address]    Report Status PENDING  Incomplete  Aerobic/Anaerobic Culture (surgical/deep wound)     Status: None (Preliminary result)   Collection Time: 09/30/20  4:56 PM   Specimen: Abscess  Result Value Ref Range Status   Specimen Description   Final    ABSCESS THIGH Performed at [facility] [address], [place], [place] [address]    Special Requests   Final    NONE Performed at [facility], [place] [address], [place], [place] [address]    Gram Stain   Final    ABUNDANT WBC PRESENT,BOTH PMN AND MONONUCLEAR ABUNDANT GRAM POSITIVE COCCI IN PAIRS    Culture   Final    CULTURE REINCUBATED FOR BETTER GROWTH Performed at [facility], [place] [address], [place], [place] [address]    Report Status PENDING  Incomplete     RN Pressure Injury Documentation:     Estimated body mass index is 29.23 kg/m as calculated from the following:   Height as of this encounter: 5' 10"  (1.778 m).   Weight as of this encounter: 92.4 kg.  Malnutrition Type:  Nutrition Problem: Increased nutrient needs Etiology: chronic illness   Malnutrition Characteristics:  Signs/Symptoms: estimated needs   Nutrition Interventions:  Interventions: Juven,Boost Breeze,Prostat     Radiology Studies: CT CHEST WO CONTRAST  Result Date: 09/30/2020 CLINICAL DATA:  Fluid collection, drain placement EXAM: CT CHEST WITHOUT CONTRAST TECHNIQUE: Multidetector CT imaging of the chest was performed following the  standard protocol without IV contrast. COMPARISON:  09/30/2020 FINDINGS: Cardiovascular: Unenhanced imaging of the heart great vessels demonstrates no pericardial effusion. Normal caliber of the thoracic aorta. Mediastinum/Nodes: No enlarged mediastinal or axillary lymph nodes. Thyroid gland, trachea, and esophagus demonstrate no significant findings. Lungs/Pleura: There is patchy bilateral upper lobe predominant airspace disease consistent with multifocal pneumonia. Trace left pleural effusion. No pneumothorax. Central airways are patent. Upper Abdomen: Hepatomegaly with nodular liver contour consistent with cirrhosis unchanged. Small volume upper abdominal ascites, greatest in the right upper quadrant. No other acute upper abdominal finding. Musculoskeletal: No acute or destructive bony lesions. Subcutaneous edema within the bilateral flanks. Reconstructed images demonstrate no additional findings. IMPRESSION: 1. Patchy bilateral upper lobe predominant airspace disease consistent with multifocal pneumonia. 2. Trace left pleural effusion. 3. Cirrhosis. 4. Trace ascites. Electronically Signed  By: Randa Ngo M.D.   On: 09/30/2020 19:16   CT ABDOMEN PELVIS W CONTRAST  Result Date: 09/30/2020 CLINICAL DATA:  35 year old male with history of MSSA bacteremia and left gluteal and thigh abscesses status post multiple drains. CT for follow-up in light of persistent leukocytosis. EXAM: CT ABDOMEN AND PELVIS WITH CONTRAST TECHNIQUE: Multidetector CT imaging of the abdomen and pelvis was performed using the standard protocol following bolus administration of intravenous contrast. CONTRAST:  117m OMNIPAQUE IOHEXOL 300 MG/ML  SOLN COMPARISON:  09/18/2020, 09/21/2020, 09/23/2020 FINDINGS: Lower chest: Interval development of scattered ground-glass in consolidative pulmonary opacities, most prominent in the right middle lobe and anterior left upper lobe, incompletely visualized. Trace left pleural effusion with  associated left basilar passive atelectasis. Hepatobiliary: Similar appearing nodular contour of the liver with hypertrophied left lobe. There is mild periportal edema. No hepatoma identified. The gallbladder is decompressed with minimal surrounding ascites, no gallstones are visualized. No intra or extrahepatic biliary ductal dilation. Pancreas: Unremarkable. No pancreatic ductal dilatation or surrounding inflammatory changes. Spleen: Normal in size without focal abnormality. Adrenals/Urinary Tract: Adrenal glands are unremarkable. Kidneys are normal, without renal calculi, focal lesion, or hydronephrosis. Bladder is decompressed with Foley catheter in place. Stomach/Bowel: Stomach is within normal limits. Appendix is not definitively visualized. No evidence of bowel wall thickening, distention, or inflammatory changes. 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 The portal system appears patent. No abdominopelvic lymphadenopathy. Reproductive: Bilateral, left greater at hydroceles are present. Prostate is unremarkable. Other: Diffuse body wall anasarca, advanced from comparison. Edema extends into the bilateral proximal thighs. Small volume ascites is present, most prominent in the pelvis. Musculoskeletal: Resolution of previously visualized psoas abscess at the site of indwelling retroperitoneal pigtail drain. In the superior aspect of the left iliacus muscle there is a peripherally enhancing fluid collection measuring approximately 3.4 x 2.5 x 1.6 cm, relatively unchanged from comparison studies. Within the anterolateral aspect of the proximal thigh, within the vastus musculature is a peripherally enhancing, lobular fluid collection measuring up to approximately 7.4 x 3.4 x 3.4 cm. There is persistent small fluid collection at the site of indwelling left anterior thigh pigtail drains which are unchanged in position. No new fluid collections are  present. Status post debridement of the medial anterior left thigh with scattered subcutaneous emphysema, improved from comparison. Status post left total hip arthroplasty without complicating features. No evidence of cortical interruption along the femur at the sites of abscess formation. No acute fracture or malalignment. IMPRESSION: 1. Persistent undrained left iliacus and left anterolateral intramuscular fluid collections with peripheral enhancement concerning for abscesses. The iliacus fluid collection would be amenable to CT-guided aspiration and the anterior thigh fluid collection would be amenable to percutaneous drain placement. 2. Resolution of left psoas abscess after drain placement. 3. Persistent small peripheral enhancing fluid collections around the indwelling distal anterior thigh drains. 4. Interval development of ground-glass and consolidative opacities in the mid lungs bilaterally, incompletely evaluated. Consider chest CT for further characterization as these findings are concerning for multifocal pneumonia. 5. Suggestion of fluid overload as evidence by interval worsening diffuse body wall anasarca, small volume ascites, trace left pleural effusion with associated left basilar passive atelectasis, and diffuse scrotal wall thickening with small volume hydroceles. 6. Morphologic changes of hepatic cirrhosis, no hepatoma visualized. DRuthann Cancer MD [facility] Electronically Signed   By: DRuthann CancerMD   On: 09/30/2020 14:38   CT FEMUR LEFT W CONTRAST  Result Date: 09/30/2020 CLINICAL DATA:  35 year old male with history of MSSA bacteremia and left gluteal and thigh abscesses status post multiple drains. CT for follow-up in light of persistent leukocytosis. EXAM: CT ABDOMEN AND PELVIS WITH CONTRAST TECHNIQUE: Multidetector CT imaging of the abdomen and pelvis was performed using the standard protocol following bolus administration of  intravenous contrast. CONTRAST:  131m OMNIPAQUE IOHEXOL 300 MG/ML  SOLN COMPARISON:  09/18/2020, 09/21/2020, 09/23/2020 FINDINGS: Lower chest: Interval development of scattered ground-glass in consolidative pulmonary opacities, most prominent in the right middle lobe and anterior left upper lobe, incompletely visualized. Trace left pleural effusion with associated left basilar passive atelectasis. Hepatobiliary: Similar appearing nodular contour of the liver with hypertrophied left lobe. There is mild periportal edema. No hepatoma identified. The gallbladder is decompressed with minimal surrounding ascites, no gallstones are visualized. No intra or extrahepatic biliary ductal dilation. Pancreas: Unremarkable. No pancreatic ductal dilatation or surrounding inflammatory changes. Spleen: Normal in size without focal abnormality. Adrenals/Urinary Tract: Adrenal glands are unremarkable. Kidneys are normal, without renal calculi, focal lesion, or hydronephrosis. Bladder is decompressed with Foley catheter in place. Stomach/Bowel: Stomach is within normal limits. Appendix is not definitively visualized. No evidence of bowel wall thickening, distention, or inflammatory changes. 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 The portal system appears patent. No abdominopelvic lymphadenopathy. Reproductive: Bilateral, left greater at hydroceles are present. Prostate is unremarkable. Other: Diffuse body wall anasarca, advanced from comparison. Edema extends into the bilateral proximal thighs. Small volume ascites is present, most prominent in the pelvis. Musculoskeletal: Resolution of previously visualized psoas abscess at the site of indwelling retroperitoneal pigtail drain. In the superior aspect of the left iliacus muscle there is a peripherally enhancing fluid collection measuring approximately 3.4 x 2.5 x 1.6 cm, relatively unchanged from comparison studies.  Within the anterolateral aspect of the proximal thigh, within the vastus musculature is a peripherally enhancing, lobular fluid collection measuring up to approximately 7.4 x 3.4 x 3.4 cm. There is persistent small fluid collection at the site of indwelling left anterior thigh pigtail drains which are unchanged in position. No new fluid collections are present. Status post debridement of the medial anterior left thigh with scattered subcutaneous emphysema, improved from comparison. Status post left total hip arthroplasty without complicating features. No evidence of cortical interruption along the femur at the sites of abscess formation. No acute fracture or malalignment. IMPRESSION: 1. Persistent undrained left iliacus and left anterolateral intramuscular fluid collections with peripheral enhancement concerning for abscesses. The iliacus fluid collection would be amenable to CT-guided aspiration and the anterior thigh fluid collection would be amenable to percutaneous drain placement. 2. Resolution of left psoas abscess after drain placement. 3. Persistent small peripheral enhancing fluid collections around the indwelling distal anterior thigh drains. 4. Interval development of ground-glass and consolidative opacities in the mid lungs bilaterally, incompletely evaluated. Consider chest CT for further characterization as these findings are concerning for multifocal pneumonia. 5. Suggestion of fluid overload as evidence by interval worsening diffuse body wall anasarca, small volume ascites, trace left pleural effusion with associated left basilar passive atelectasis, and diffuse scrotal wall thickening with small volume hydroceles. 6. Morphologic changes of hepatic cirrhosis, no hepatoma visualized. DRuthann Cancer MD [facility] Electronically Signed   By: DRuthann CancerMD   On: 09/30/2020 14:38   [place]orea[place]acentesis  Result Date: 09/30/2020 INDICATION:  Patient with history of septic arthritis of the left hip with prior iliopsoas and left  proximal and medial thigh abscesses, COVID-19 in January 2022 ,staph bacteremia, alcoholic cirrhosis, ascites. Request received for diagnostic and therapeutic paracentesis up to 3 liters. EXAM: ULTRASOUND GUIDED DIAGNOSTIC AND THERAPEUTIC PARACENTESIS MEDICATIONS: 1% lidocaine to skin and subcutaneous tissue COMPLICATIONS: None immediate. PROCEDURE: Informed written consent was obtained from the patient after a discussion of the risks, benefits and alternatives to treatment. A timeout was performed prior to the initiation of the procedure. Initial ultrasound scanning demonstrates a moderate to large amount of ascites within the right mid to lower abdominal quadrant. The right mid to lower abdomen was prepped and draped in the usual sterile fashion. 1% lidocaine was used for local anesthesia. Following this, a 19 gauge, 10-cm, Yueh catheter was introduced. An ultrasound image was saved for documentation purposes. The paracentesis was performed. The catheter was removed and a dressing was applied. The patient tolerated the procedure well without immediate post procedural complication. FINDINGS: A total of approximately 3 liters of clear, light yellow fluid was removed. Samples were sent to the laboratory as requested by the clinical team. IMPRESSION: Successful ultrasound-guided diagnostic and therapeutic paracentesis yielding 3 liters of peritoneal fluid. Read by: Rowe Robert, PA-C Electronically Signed   By: Ruthann Cancer MD   On: 09/30/2020 12:35   Scheduled Meds: . (feeding supplement) PROSource Plus  30 mL Oral BID BM  . Chlorhexidine Gluconate Cloth  6 each Topical Daily  . docusate sodium  100 mg Oral BID  . feeding supplement  1 Container Oral BID BM  . folic acid  1 mg Oral Daily  . furosemide  40 mg Oral Daily  . guaiFENesin  1,200 mg Oral BID  . hydrocortisone cream   Topical BID  . lactulose  10 g Oral BID   . mouth rinse  15 mL Mouth Rinse BID  . metoprolol tartrate  25 mg Oral BID  . metroNIDAZOLE  500 mg Oral Q8H  . morphine  15 mg Oral Q12H  . multivitamin with minerals  1 tablet Oral Daily  . nutrition supplement (JUVEN)  1 packet Oral BID BM  . pantoprazole  40 mg Oral BID  . rifampin  300 mg Oral Q12H  . senna  1 tablet Oral BID  . [START ON 10/02/2020] sertraline  50 mg Oral Daily  . sodium chloride flush  5 mL Intracatheter Q8H  . sodium chloride flush  5 mL Intracatheter Q8H  . spironolactone  100 mg Oral Daily  . thiamine  100 mg Oral Daily   Continuous Infusions: .  ceFAZolin (ANCEF) IV      LOS: 18 days    Kerney Elbe, DO [facility] PAGER is on [place]  If 7PM-7AM, please contact night-coverage [email or web address]

## 2020-10-01 NOTE — Progress Notes (Signed)
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 Cultures sent.  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 No involvement of the back, chest, face, upper and lower extremities   Recommendations -Continue cefazolin for now -Unclear of what to make of the rash given its distrubution mainly in the left side of the abdomen close to the place where the drain was removed. Will need to closely for worsening. Drug rash is a consideration but would  have expected it to be more diffuse.  -Fu repeat blood cultures from today,  suspect low grade fevers are related to recent procedures and would resolve -Fu abscess cultures from 3/11  Dr Drue Second will assume care from 3/14  Rest of the management as per the primary team. Thank you for the consult. Please page with pertinent questions or concerns.  ______________________________________________________________________ Subjective patient seen and examined at the bedside. Wife at bedside. Complains of erythematous rashes in the left side of the abdomen which per wife was noted yesterday. Denies any cough, chest pain, SOB. Denies N/V/D. Denies itching    Vitals BP 106/69 (BP Location: Right Arm)    Pulse (!) 112    Temp (!) 100.4 F (38 C) (Oral)    Resp 16    Ht  (1.778 m)    Wt 92.4 kg    SpO2 96%    BMI 29.23 kg/m      Physical Exam Lying in bed, appears to be comfortable and able to speak in full sentences in room air  PERRLA Chest coarse breath sounds CVS- normal heart sounds, no murmur Abdomen - soft   Extremities - Left leg has drains  Neuro - grossly non focal   Pertinent Microbiology Results for orders placed or performed during the hospital encounter of 09/13/20  Culture, blood (routine x 2)     Status: Abnormal   Collection Time: 09/13/20 10:45 AM   Specimen: BLOOD  Result  Value Ref Range Status   Specimen Description   Final    BLOOD RIGHT ANTECUBITAL Performed at [facility], [address], [place], [place] [address]    Special Requests   Final    BOTTLES DRAWN AEROBIC AND ANAEROBIC Blood Culture adequate volume Performed at [facility], [address] [place], [place] [address]    Culture  Setup Time   Final    IN BOTH AEROBIC AND ANAEROBIC BOTTLES GRAM POSITIVE COCCI CRITICAL RESULT CALLED TO, READ BACK BY AND VERIFIED WITHErling Cruz [facility] 09/13/20 0347 JDW Performed at [facility], [address] [place], [place] [address]    Culture STAPHYLOCOCCUS AUREUS (A)  Final   Report  Status 09/16/2020 FINAL  Final   Organism ID, Bacteria STAPHYLOCOCCUS AUREUS  Final      Susceptibility   Staphylococcus aureus - MIC*    CIPROFLOXACIN <=0.5 SENSITIVE Sensitive     ERYTHROMYCIN <=0.25 SENSITIVE Sensitive     GENTAMICIN <=0.5 SENSITIVE Sensitive     OXACILLIN <=0.25 SENSITIVE Sensitive     TETRACYCLINE <=1 SENSITIVE Sensitive     VANCOMYCIN <=0.5 SENSITIVE Sensitive     TRIMETH/SULFA <=10 SENSITIVE Sensitive     CLINDAMYCIN <=0.25 SENSITIVE Sensitive     RIFAMPIN <=0.5 SENSITIVE Sensitive     Inducible Clindamycin NEGATIVE Sensitive     * STAPHYLOCOCCUS AUREUS  Blood Culture ID Panel (Reflexed)     Status: Abnormal   Collection Time: 09/13/20 10:45 AM  Result Value Ref Range Status   Enterococcus faecalis NOT DETECTED NOT DETECTED Final   Enterococcus Faecium NOT DETECTED NOT DETECTED Final   Listeria monocytogenes NOT DETECTED NOT DETECTED Final   Staphylococcus species DETECTED (A) NOT DETECTED Final    Comment: CRITICAL RESULT CALLED TO, READ BACK BY AND VERIFIED WITH: E JACKSON PHARMD 09/13/20 0347 JDW    Staphylococcus aureus (BCID) DETECTED (A) NOT DETECTED Final    Comment: CRITICAL RESULT CALLED TO, READ BACK BY AND VERIFIED WITH: E JACKSON PHARMD 09/13/20 0347 JDW    Staphylococcus epidermidis NOT DETECTED NOT DETECTED Final   Staphylococcus lugdunensis NOT DETECTED NOT DETECTED Final   Streptococcus species NOT DETECTED NOT DETECTED Final   Streptococcus agalactiae NOT DETECTED NOT DETECTED Final   Streptococcus pneumoniae NOT DETECTED NOT DETECTED Final   Streptococcus pyogenes NOT DETECTED NOT DETECTED Final   A.calcoaceticus-baumannii NOT DETECTED NOT DETECTED Final   Bacteroides fragilis NOT DETECTED NOT DETECTED Final   Enterobacterales NOT DETECTED NOT DETECTED Final   Enterobacter cloacae complex NOT DETECTED NOT DETECTED Final   Escherichia coli NOT DETECTED NOT DETECTED Final   Klebsiella aerogenes NOT DETECTED NOT DETECTED Final   Klebsiella  oxytoca NOT DETECTED NOT DETECTED Final   Klebsiella pneumoniae NOT DETECTED NOT DETECTED Final   Proteus species NOT DETECTED NOT DETECTED Final   Salmonella species NOT DETECTED NOT DETECTED Final   Serratia marcescens NOT DETECTED NOT DETECTED Final   Haemophilus influenzae NOT DETECTED NOT DETECTED Final   Neisseria meningitidis NOT DETECTED NOT DETECTED Final   Pseudomonas aeruginosa NOT DETECTED NOT DETECTED Final   Stenotrophomonas maltophilia NOT DETECTED NOT DETECTED Final   Candida albicans NOT DETECTED NOT DETECTED Final   Candida auris NOT DETECTED NOT DETECTED Final   Candida glabrata NOT DETECTED NOT DETECTED Final   Candida krusei NOT DETECTED NOT DETECTED Final   Candida parapsilosis NOT DETECTED NOT DETECTED Final   Candida tropicalis NOT DETECTED NOT DETECTED Final   Cryptococcus neoformans/gattii NOT  DETECTED NOT DETECTED Final   Meth resistant mecA/C and MREJ NOT DETECTED NOT DETECTED Final    Comment: Performed at [facility], [address], [place], [place] [address]  Culture, blood (routine x 2)     Status: Abnormal   Collection Time: 09/13/20 11:20 AM   Specimen: BLOOD  Result Value Ref Range Status   Specimen Description   Final    BLOOD RIGHT ANTECUBITAL Performed at [facility], [address], [place], [place] [address]    Special Requests   Final    BOTTLES DRAWN AEROBIC AND ANAEROBIC Blood Culture adequate volume Performed at [facility], [address] [place], [place] [address]    Culture  Setup Time   Final    IN BOTH AEROBIC AND ANAEROBIC BOTTLES GRAM POSITIVE COCCI CRITICAL VALUE NOTED.  VALUE IS CONSISTENT WITH PREVIOUSLY REPORTED AND CALLED VALUE.    Culture (A)  Final    STAPHYLOCOCCUS AUREUS SUSCEPTIBILITIES PERFORMED ON PREVIOUS CULTURE WITHIN THE LAST 5 DAYS. Performed at [facility], [address], [place], [place] [address]    Report Status 09/16/2020 FINAL  Final  Resp Panel by  RT-PCR (Flu A&B, Covid) Nasopharyngeal Swab     Status: None   Collection Time: 09/13/20  2:01 PM   Specimen: Nasopharyngeal Swab; Nasopharyngeal(NP) swabs in vial transport medium  Result Value Ref Range Status   SARS Coronavirus 2 by RT PCR NEGATIVE NEGATIVE Final    Comment: (NOTE) SARS-CoV-2 target nucleic acids are NOT DETECTED.  The SARS-CoV-2 RNA is generally detectable in upper respiratory specimens during the acute phase of infection. The lowest concentration of SARS-CoV-2 viral copies this assay can detect is 138 copies/mL. A negative result does not preclude SARS-Cov-2 infection and should not be used as the sole basis for treatment or other patient management decisions. A negative result may occur with  improper specimen collection/handling, submission of specimen other than nasopharyngeal swab, presence of viral mutation(s) within the areas targeted by this assay, and inadequate number of viral copies(<138 copies/mL). A negative result must be combined with clinical observations, patient history, and epidemiological information. The expected result is Negative.  Fact Sheet for Patients:  BloggerCourse.com  Fact Sheet for Healthcare Providers:  SeriousBroker.it  This test is no t yet approved or cleared by the [place] FDA and  has been authorized for detection and/or diagnosis of SARS-CoV-2 by FDA under an Emergency Use Authorization (EUA). This EUA will remain  in effect (meaning this test can be used) for the duration of the COVID-19 declaration under Section 564(b)(1) of the Act, 21 U.S.C.section 360bbb-3(b)(1), unless the authorization is terminated  or revoked sooner.       Influenza A by PCR NEGATIVE NEGATIVE Final   Influenza B by PCR NEGATIVE NEGATIVE Final    Comment: (NOTE) The Xpert Xpress SARS-CoV-2/FLU/RSV plus assay is intended as an aid in the diagnosis of influenza from Nasopharyngeal swab  specimens and should not be used as a sole basis for treatment. Nasal washings and aspirates are unacceptable for Xpert Xpress SARS-CoV-2/FLU/RSV testing.  Fact Sheet for Patients: BloggerCourse.com  Fact Sheet for Healthcare Providers: SeriousBroker.it  This test is not yet approved or cleared by the [place] FDA and has been authorized for detection and/or diagnosis of SARS-CoV-2 by FDA under an Emergency Use Authorization (EUA). This EUA will remain in effect (meaning this test can be used) for the duration of the COVID-19 declaration under Section 564(b)(1) of the  Act, 21 U.S.C. section 360bbb-3(b)(1), unless the authorization is terminated or revoked.  Performed at [facility], [address], [place], [place] [address]   Culture, blood (routine x 2)     Status: None   Collection Time: 09/14/20  9:23 AM   Specimen: BLOOD RIGHT HAND  Result Value Ref Range Status   Specimen Description   Final    BLOOD RIGHT HAND Performed at [facility], [address], [place], [place] [address]    Special Requests   Final    BOTTLES DRAWN AEROBIC AND ANAEROBIC Blood Culture adequate volume Performed at [facility], [address], [place], [place] [address]    Culture   Final    NO GROWTH 5 DAYS Performed at [facility], [address] [place], [place] [address]    Report Status 09/19/2020 FINAL  Final  Culture, blood (routine x 2)     Status: None   Collection Time: 09/14/20  9:24 AM   Specimen: BLOOD LEFT HAND  Result Value Ref Range Status   Specimen Description   Final    BLOOD LEFT HAND Performed at [facility], [address] [place], [place] [address]    Special Requests   Final    BOTTLES DRAWN AEROBIC ONLY Blood Culture adequate volume Performed at [facility], [address], [place], [place] [address]     Culture   Final    NO GROWTH 5 DAYS Performed at [facility], [address] [place], [place] [address]    Report Status 09/19/2020 FINAL  Final  Aerobic/Anaerobic Culture (surgical/deep wound)     Status: None   Collection Time: 09/14/20 12:37 PM   Specimen: Abscess  Result Value Ref Range Status   Specimen Description   Final    ABSCESS  LEFT RP Performed at [facility], [address], [place], [place] [address]    Special Requests   Final    Normal Performed at [facility], [address] [place], [place] [address]    Gram Stain   Final    MODERATE WBC PRESENT, PREDOMINANTLY PMN ABUNDANT GRAM POSITIVE COCCI ABUNDANT GRAM NEGATIVE RODS    Culture   Final    ABUNDANT STAPHYLOCOCCUS AUREUS MODERATE BACTEROIDES ORALIS FEW PREVOTELLA BIVIA BETA LACTAMASE POSITIVE Performed at [facility], [address] [place], [place] [address]    Report Status 09/19/2020 FINAL  Final   Organism ID, Bacteria STAPHYLOCOCCUS AUREUS  Final      Susceptibility   Staphylococcus aureus - MIC*    CIPROFLOXACIN <=0.5 SENSITIVE Sensitive     ERYTHROMYCIN <=0.25 SENSITIVE Sensitive     GENTAMICIN <=0.5 SENSITIVE Sensitive     OXACILLIN <=0.25 SENSITIVE Sensitive     TETRACYCLINE <=1 SENSITIVE Sensitive     VANCOMYCIN <=0.5 SENSITIVE Sensitive     TRIMETH/SULFA <=10 SENSITIVE Sensitive     CLINDAMYCIN <=0.25 SENSITIVE Sensitive     RIFAMPIN <=0.5 SENSITIVE Sensitive     Inducible Clindamycin NEGATIVE Sensitive     * ABUNDANT STAPHYLOCOCCUS AUREUS  MRSA PCR Screening     Status: None   Collection Time: 09/15/20  3:22 AM   Specimen: Nasal Mucosa; Nasopharyngeal  Result Value Ref Range Status   MRSA by PCR NEGATIVE NEGATIVE Final    Comment:        The GeneXpert MRSA Assay (FDA approved for NASAL specimens only), is one component of  a comprehensive MRSA colonization surveillance program. It is not intended to diagnose MRSA infection  nor to guide or monitor treatment for MRSA infections. Performed at [facility], [address], [place], [place] [address]   Body fluid culture w Gram Stain     Status: None (Preliminary result)   Collection Time: 09/30/20 12:55 PM   Specimen: Peritoneal Washings; Body Fluid  Result Value Ref Range Status   Specimen Description   Final    PERITONEAL Performed at [facility], [address], [place], [place] [address]    Special Requests   Final    Normal Performed at [facility], [address] [place], [place] [address]    Gram Stain NO WBC SEEN NO ORGANISMS SEEN   Final   Culture   Final    NO GROWTH < 12 HOURS Performed at [facility], [address], [place], [place] [address]    Report Status PENDING  Incomplete  Aerobic/Anaerobic Culture (surgical/deep wound)     Status: None (Preliminary result)   Collection Time: 09/30/20  4:56 PM   Specimen: Abscess  Result Value Ref Range Status   Specimen Description   Final    ABSCESS THIGH Performed at [facility], [address], [place], [place] [address]    Special Requests   Final    NONE Performed at [facility], [address], [place], [place] [address]    Gram Stain   Final    ABUNDANT WBC PRESENT,BOTH PMN AND MONONUCLEAR ABUNDANT GRAM POSITIVE COCCI IN PAIRS    Culture   Final    CULTURE REINCUBATED FOR BETTER GROWTH Performed at [facility], [address] [place], [place] [address]    Report Status PENDING  Incomplete   Pertinent Lab. CBC Latest Ref Rng & Units 10/01/2020 09/30/2020 09/29/2020  WBC 4.0 - 10.5 K/uL 14.4(H) 17.3(H) 15.6(H)  Hemoglobin 13.0 - 17.0 g/dL 7.5(L) 8.1(L) 8.2(L)  Hematocrit 39.0 - 52.0 % 23.5(L) 25.3(L) 26.1(L)  Platelets 150 - 400 K/uL 201 202 183   CMP Latest Ref Rng & Units 10/01/2020 09/30/2020 09/29/2020  Glucose 70 - 99 mg/dL 91 86 88  BUN 6 - 20 mg/dL 9 16 14    Creatinine 0.61 - 1.24 mg/dL ) 0.93(O) 6.71(I)  Sodium 135 - 145 mmol/L 128(L) 128(L) 129(L)  Potassium 3.5 - 5.1 mmol/L 3.7 4.0 4.4  Chloride 98 - 111 mmol/L 96(L) 98 100  CO2 22 - 32 mmol/L 24 21(L) 20(L)  Calcium 8.9 - 10.3 mg/dL 7.6(L) 7.7(L) 7.8(L)  Total Protein 6.5 - 8.1 g/dL 5.2(L) 5.4(L) 5.3(L)  Total Bilirubin 0.3 - 1.2 mg/dL 1.0 1.2 1.0  Alkaline Phos 38 - 126 U/L 152(H) 181(H) 189(H)  AST 15 - 41 U/L 26 30 28   ALT 0 - 44 U/L 7 8 7      Pertinent Imaging today Plain films and CT images have been personally visualized and interpreted; radiology reports have been reviewed. Decision making incorporated into the Impression / Recommendations.  CT CHEST WO CONTRAST 09/30/2020  Narrative CLINICAL DATA:  Fluid collection, drain placement  EXAM: CT CHEST WITHOUT CONTRAST  TECHNIQUE: Multidetector CT imaging of the chest was performed following the standard protocol without IV contrast.  COMPARISON:  09/30/2020  FINDINGS: Cardiovascular: Unenhanced imaging of the heart great vessels demonstrates no pericardial effusion. Normal caliber of the thoracic aorta.  Mediastinum/Nodes: No enlarged mediastinal or axillary lymph nodes. Thyroid gland, trachea, and esophagus demonstrate  no significant findings.  Lungs/Pleura: There is patchy bilateral upper lobe predominant airspace disease consistent with multifocal pneumonia. Trace left pleural effusion. No pneumothorax. Central airways are patent.  Upper Abdomen: Hepatomegaly with nodular liver contour consistent with cirrhosis unchanged. Small volume upper abdominal ascites, greatest in the right upper quadrant. No other acute upper abdominal finding.  Musculoskeletal: No acute or destructive bony lesions. Subcutaneous edema within the bilateral flanks. Reconstructed images demonstrate no additional findings.  IMPRESSION: 1. Patchy bilateral upper lobe predominant airspace disease consistent with multifocal  pneumonia. 2. Trace left pleural effusion. 3. Cirrhosis. 4. Trace ascites.  I have spent approx 30 minutes for this patient encounter including review of prior medical records, coordination of care  with greater than 50% of time being face to face/counseling and discussing diagnostics/treatment plan with the patient/family.  Electronically signed by:   Odette FractionSabina Sharaine Delange, MD Infectious Disease Physician [facility]   [facility] Pager: [phone]

## 2020-10-02 DIAGNOSIS — J189 Pneumonia, unspecified organism: Secondary | ICD-10-CM

## 2020-10-02 DIAGNOSIS — B9561 Methicillin susceptible Staphylococcus aureus infection as the cause of diseases classified elsewhere: Secondary | ICD-10-CM | POA: Diagnosis not present

## 2020-10-02 DIAGNOSIS — R509 Fever, unspecified: Secondary | ICD-10-CM | POA: Diagnosis not present

## 2020-10-02 DIAGNOSIS — K6812 Psoas muscle abscess: Secondary | ICD-10-CM | POA: Diagnosis not present

## 2020-10-02 DIAGNOSIS — F1029 Alcohol dependence with unspecified alcohol-induced disorder: Secondary | ICD-10-CM | POA: Diagnosis not present

## 2020-10-02 DIAGNOSIS — R7881 Bacteremia: Secondary | ICD-10-CM | POA: Diagnosis not present

## 2020-10-02 DIAGNOSIS — K7011 Alcoholic hepatitis with ascites: Secondary | ICD-10-CM | POA: Diagnosis not present

## 2020-10-02 DIAGNOSIS — R21 Rash and other nonspecific skin eruption: Secondary | ICD-10-CM

## 2020-10-02 DIAGNOSIS — M009 Pyogenic arthritis, unspecified: Secondary | ICD-10-CM

## 2020-10-02 LAB — COMPREHENSIVE METABOLIC PANEL
ALT: 8 U/L (ref 0–44)
AST: 30 U/L (ref 15–41)
Albumin: 2.1 g/dL — ABNORMAL LOW (ref 3.5–5.0)
Alkaline Phosphatase: 170 U/L — ABNORMAL HIGH (ref 38–126)
Anion gap: 10 (ref 5–15)
BUN: 12 mg/dL (ref 6–20)
CO2: 22 mmol/L (ref 22–32)
Calcium: 7.7 mg/dL — ABNORMAL LOW (ref 8.9–10.3)
Chloride: 96 mmol/L — ABNORMAL LOW (ref 98–111)
Creatinine, Ser: 0.36 mg/dL — ABNORMAL LOW (ref 0.61–1.24)
GFR, Estimated: >60 mL/min (ref >60)
Glucose, Bld: 97 mg/dL (ref 70–99)
Potassium: 3.7 mmol/L (ref 3.5–5.1)
Sodium: 128 mmol/L — ABNORMAL LOW (ref 135–145)
Total Bilirubin: 0.9 mg/dL (ref 0.3–1.2)
Total Protein: 5.5 g/dL — ABNORMAL LOW (ref 6.5–8.1)

## 2020-10-02 LAB — MAGNESIUM: Magnesium: 1.7 mg/dL (ref 1.7–2.4)

## 2020-10-02 LAB — CBC WITH DIFFERENTIAL/PLATELET
Abs Immature Granulocytes: 0.22 10*3/uL — ABNORMAL HIGH (ref 0.00–0.07)
Basophils Absolute: 0.1 10*3/uL (ref 0.0–0.1)
Basophils Relative: 0 %
Eosinophils Absolute: 0.2 10*3/uL (ref 0.0–0.5)
Eosinophils Relative: 2 %
HCT: 25.6 % — ABNORMAL LOW (ref 39.0–52.0)
Hemoglobin: 8.1 g/dL — ABNORMAL LOW (ref 13.0–17.0)
Immature Granulocytes: 2 %
Lymphocytes Relative: 20 %
Lymphs Abs: 2.4 10*3/uL (ref 0.7–4.0)
MCH: 32.8 pg (ref 26.0–34.0)
MCHC: 31.6 g/dL (ref 30.0–36.0)
MCV: 103.6 fL — ABNORMAL HIGH (ref 80.0–100.0)
Monocytes Absolute: 0.7 10*3/uL (ref 0.1–1.0)
Monocytes Relative: 6 %
Neutro Abs: 8.7 10*3/uL — ABNORMAL HIGH (ref 1.7–7.7)
Neutrophils Relative %: 70 %
Platelets: 199 10*3/uL (ref 150–400)
RBC: 2.47 MIL/uL — ABNORMAL LOW (ref 4.22–5.81)
RDW: 18.3 % — ABNORMAL HIGH (ref 11.5–15.5)
WBC: 12.2 10*3/uL — ABNORMAL HIGH (ref 4.0–10.5)
nRBC: 0.2 % (ref 0.0–0.2)

## 2020-10-02 LAB — PHOSPHORUS: Phosphorus: 3.8 mg/dL (ref 2.5–4.6)

## 2020-10-02 MED ORDER — MAGNESIUM SULFATE 2 GM/50ML IV SOLN
2.0000 g | Freq: Once | INTRAVENOUS | Status: AC
  Administered 2020-10-02: 2 g via INTRAVENOUS
  Filled 2020-10-02: qty 50

## 2020-10-02 MED ORDER — METOPROLOL TARTRATE 50 MG PO TABS
50.0000 mg | ORAL_TABLET | Freq: Two times a day (BID) | ORAL | Status: DC
Start: 2020-10-02 — End: 2020-10-10
  Administered 2020-10-02 – 2020-10-10 (×16): 50 mg via ORAL
  Filled 2020-10-02 (×16): qty 1

## 2020-10-02 NOTE — Progress Notes (Signed)
PROGRESS NOTE    [name]  KWI:[id number] DOB: November 08, 1985 DOA: 09/13/2020 PCP: [name], PA   Brief Narrative:  The patient is a 35 year old male with past medical history of AVN of the hip, hypertension, GERD he was recently admitted on 08/22/2020 for COVID-19 and was discharged on 08/29/2020. 2 days after discharge patient began to feel extremely weak and tired with symptoms progressing over next 2 weeks. Patient developed left thigh rash and leg pain with swelling and tenderness to touch. Patient was seen at PCP office and was told to go to the ED immediately. He was found to have left iliopsoas and left gluteal abscess with diffuse swelling and left hip effusion on CT scan. General surgery was consulted. Blood cultures positive for MSSA bacteremia. He underwent drainage of the abscess with 3 drain placement by IR in his iliopsoas muscle and multiple abscesses in the legs. On 09/15/2020 had left hip interposition arthroplasty with antibiotic spacer placement and debridement and developed decompensated septic shock with refractory hypotension despite 2 units PRBC, 2.2 L of crystalloid and 500 cc of albumin. He was admitted to ICU with vasopressor support. ID has been consulted and recommend 6 weeks of IV cefazolin.Patient also had scrotal edema anasarca in the setting of IV fluid resuscitation. Ultrasound was unremarkable for abscess but showed marked asymmetric left scrotal wall thickening and fluid with increased blood flow consistent with soft tissue infection.  Patient had multiple incision and drainage done by general surgery and also multiple aspiration of the abscesses with drain placement per IR.  He continues to need abscess drainage and wound VAC placement as per general surgery, Ortho and IR.  Because his abdomen was more distended and CT scan of the abdomen pelvis done last week showed moderate to large ascites we will consult GI.  I discussed with them about obtaining  a ultrasound of the abdomen and they will order also ordered a paracentesis.  Patient was given a dose of IV Lasix given his anasarca now.  GI has started the patient on a 2 g sodium diet and also started the patient on Lasix 40 minutes p.o. daily as well as Aldactone 100 g p.o. daily and follow his renal function carefully for the next few days.  Paracentesis been ordered and they are recommending full labs with cell count, cytology, protein, LDH and albumin and recommendations for weigh the patient daily.  Patient underwent his paracentesis on 09/30/2020 today drains started putting out less output interventional radiology is going to obtain a follow-up CT of the abdomen pelvis as well as a left femur. He had additional drains placed on 09/30/20 and Spiked a Temperature on 10/01/20 so Blood Cx were reordered.   Assessment & Plan:   Principal Problem:   MSSA bacteremia Active Problems:   Alcohol dependence ([place])   Alcoholic hepatitis   Sepsis ([place])   Septic arthritis ([place])   Iliopsoas abscess on left ([place])  Iliopsoas and left gluteal abscesses associated with MSSA Bacteremia and Groin Abscess Septic shock secondary to above and left hip septic arthritis and MSSA bacteremia Left hip effusion and septic arthritis in the setting of avascular necrosis -Admitted to Inpatient Telemetry  -Initially started on IV vanc, cefepime; pharmacy to dose but changed to IV Cefazolin by ID and now on IV Cefazolin and IV Metronidazole -Repeat Blood Cx x2 and obtain ECHO; See below -Initial Blood Cx 09/13/20 showed Staph Aureus in Both sets of Cx's; Repeated Blood Cx 10/01/20 -Sepsis Criteria met with: tachycardia, tachypnea,  lactic acid 3.3, and Leukocytosis was 10.6 source: abscesses as above as well as bacteremia and went into septic shock during this admission -WBC was trending up and trended up to 17.3 and is now 14.4 and further improved to 12.2 -General surgery, interventional radiology, orthopedic surgery,  infectious disease all consulted and appreciate their assistance -He has now 3 drains in place -Patient underwent a transesophageal echocardiogram that did not mention any vegetation -Repeat blood cultures have been done and show NGTD at 5 Days; since if TTE is negative they can recommend a transesophageal echocardiogram then but since his TTE was a difficult Study, ID Dr. Juleen China recommending TEE given Disseminated MSSA infection so he underwent a transesophageal echocardiogram on Monday, 09/19/2020. -Because of this orthopedic surgery reviewed the prior CT scan and they felt the best course of action is to take the patient for an open irrigation debridement of the medial and lateral thigh to express these fluid collections he went back to the OR on 09/21/2020 as well as 09/27/2020 neurosurgery was also consulted given that infection had spread into the left groin -IR recommending Repeating CT Abd/Pelvis and Femur 09/30/20 because WBC is also going up and was done yesterday persistent undrained left cardiac cath and left anterolateral intramuscular fluid collections with peripheral enhancement concerning for abscesses that were amenable to CT-guided aspiration: CT-guided aspiration and drainage placement was done -CT scan also showed resolution of the psoas abscess drain after placement and there is persistent small peripheral enhancing fluid collections around the indwelling anterior thigh drains -a CT scan of the abdomen and pelvis also did note the patient had interval development of groundglass and consolidative opacities in the mid lungs bilaterally and a CT of the chest was dedicated to this and done and showed patchy bilateral upper lobe predominant airspace disease consistent with multifocal pneumonia -Conntinue with antibiotics for now but patient did spike a temperature yesterday afternoon on 10/01/2020 so we will repeat his blood cultures and reconsult ID for further evaluation -LE Venous Duplex  repeated and showed no DVT but portions were limited  -General surgery is recommending continue antibiotics as well as dressing changes twice daily -Continue PT OT evaluation and continue dressing changes per orthopedic surgery and VT prophylaxis further recommendations but currently this is held given his anemia and thrombocytopenia -PT/OT recommending CIR but once he is only cleared from a medical perspective and once orthopedic surgery signed off the case and has no further debridement plans and once he is stable from that perspective  Chronic Pain -pain is not controlled, will start MS Contin 15 mg p.o. twice daily, discontinue oxycodone.   -We will continue with Dilaudid 1 mg IV every 4 hours as needed for breakthrough pain  Symptomatic Anemia Macrocytic Anemia -Presented with Hgb/Hct of 6.2/18.9 -Patient is status post 8 units of PRBCs total  Patient is hemoglobin/hematocrit is relatively stable at 8.1/25.6 now -FOBT negative -Anemia panel done and showed an iron level of 44, U IBC 131, TIBC 175, saturation ratios of 25% -check LDH and was 308, haptoglobin pending, iron studies showed an iron level of 44, U IBC 131, TIBC 175, saturation ratios of 25% -Continue to monitor for signs and symptoms of bleeding; currently no overt bleeding noted  -repeat CBC in a.m.  Hx of Alcoholic Hepatitis and alcohol abuse Hepatic Cirrhosis ascites with anasarca and volume overload Hyperbilirubinemia, was improving and now worsening  -Was continiung Prednisolone but this was discontinued due to disseminated MSSA bacteremia. -LFTs have normalized and so has  his T bili but he continues to have some moderate ascites to large ascites noted on his CT scan and is more distended today with his anasarca in the setting of his hepatic cirrhosis -Patient and family report he has been alcohol free for 6 weeks -Continue lactulose 10 g p.o. twice daily, thiamine, magnesium -abdomen appears more distended appear  more fluid-filled today -Will have GI weigh in for addition of spironolactone and furosemide and defer to them to obtain a ultrasound of the abdomen to evaluate his ascites as patient may need a paracentesis and fluid studies -GI have asked IR to do a paracentesis and this was done on 09/30/2020 with removal of 3 L maximum and fluid was sent for analysis studies; fluid analysis studies were not consistent with SBP -GI is also added the patient on Lasix 40 mg p.o. daily and spironolactone 100 g p.o. daily and will need to continue monitor his renal function closely -May need to more diuresis given his anasarca and his given albumin yesterday -INR was 1.4 -Continue monitor hepatic function daily and has bilirubin and his LFTs are normalized -Still remains extremely volume overloaded but thankfully has now normalized  Hx of Tachycardia; Now in Sinus Tachycardia intermittently HTN  -Baseline heart rate is anywhere between 100-110 per family -We will change carvedilol 6.25 mg p.o. twice daily to Metoprolol tartrate 25 mg p.o. twice daily and will further increase to 50 mg p.o. twice daily  GAD -Increase sertraline dose to 50 mg p.o. nightly yesterday  GERD -C/w Pantoprazole 40 mg p.o. twice daily and will continue for now   Hyponatremia -Initiated on IV fluid when he was in septic shock but now this to stop and he was given diuresis -Sodium is now relatively stable at 128 -GI has now added 40 mg of p.o. Lasix as well as 100 mg spironolactone p.o. daily -Continue to monitor and trend and repeat CMP in a.m.  Metabolic Acidosis -Mild  -Patient CO2 is now  22, anion gap is 10, chloride level is 96 -We have stopped his IV fluid hydration and he was given a dose of IV Lasix yesterday -Continue to monitor and trend and repeat CMP in a.m.  Hypophosphatemia -Patient's phosphorus level is now stable at 3.8 -Continue to monitor and replete as necessary -Repeat phosphorus level in  a.m.  Hypomagnesemia -Patient magnesium was 1.7  -Replete with IV mag sulfate 2 g again today and today is the third day in a row -Continue to monitor and replete as necessary -Repeat Mag Level in the AM   Scrotal Edema and Anasarca, persistent -Patient was positive fluid balance prior to him getting a paracentesis and now is getting diuresed and is now -3.086 which still remains significantly I am overload and anasarca. -In the setting of his hepatic cirrhosis and IV fluid resuscitation as well as his infection; now has a lot of ascites -Stopped IV fluids and recommend scrotal elevation and sling and obtain a scrotal ultrasound and Doppler to rule out any pathology -Initial scrotal U/S done and showed "No evidence of testicular mass or torsion. Marked asymmetric left scrotal wall thickening and fluid, with increased blood flow. This is likely due to soft tissue infection, and part of the much more widespread process in the left pelvis and thigh demonstrated on prior CT." -Foley catheter has been removed but now has been placed back in -Repeat ultrasound done yesterday was concerning for soft tissue infection but urology evaluated and appreciate Dr. Lynne Logan evaluation and feels that  this appears to be related to the regional edema from his leg and inguinal infection as well as edema.  There is no abscess on recent ultrasound and no evidence of scrotal skin infection and they recommended continue to monitor observe and recommending continuing for a long is a difficult for the patient ambulate for using urinal -Continue with Extremity Elevation and will give a dose of IV Lasix yesterday and he has p.o. Lasix and Aldactone added by gastroenterology  Thrombocytopenia  -Patient's platelet count has now normalized and is 199,000 now -Continue to monitor for signs and symptoms of bleeding; currently no overt bleeding noted -Repeat CBC in a.m.  DVT prophylaxis: SCDs Code Status: FULL CODE Family  Communication: Girlfriend was not at bedside today Disposition Plan: Pending further clinical improvement will go to CIR  Status is: Inpatient  Remains inpatient appropriate because:Unsafe d/c plan, IV treatments appropriate due to intensity of illness or inability to take PO and Inpatient level of care appropriate due to severity of illness   Dispo: The patient is from: Home              Anticipated d/c is to: CIR              Patient currently is not medically stable to d/c.   Difficult to place patient No  Consultants:   General Surgery  Urology  Orthopedic Surgery  Interventional Radiology  Infectious Diseases   Gastroenterology    Procedures:  TOTAL HIP ARTHROPLASTY WITH CEMENT INTERPOSITION SPACINGfor septic hip arthritis from septicemia in the setting of alcoholic AVN of the left hip  Drainage of the abscess with 3 drain placement by IR in iliopsoas muscle and multiple abscesses in the legs  PROCEDURE by Dr. Mardelle Matte on 09/27/20: Left thigh irrigation and debridement, excisional debridement, skin, subcutaneous tissue, muscle with partial closure  Procedure: Incision and drainage of left groin abscess and left thigh abscess done by Dr. Romana Juniper on 09/27/20  Paracentesis done on 09/30/2020 performed yielding 3 liters (maximum ordered) of clear, light yellow fluid. No immediate complications.  A portion of the fluid was submitted to the lab for preordered studies.EBL none.   Antimicrobials:  Anti-infectives (From admission, onward)   Start     Dose/Rate Route Frequency Ordered Stop   10/01/20 2200  ceFAZolin (ANCEF) IVPB 2g/100 mL premix        2 g 200 mL/hr over 30 Minutes Intravenous Every 8 hours 10/01/20 1602     09/29/20 1700  rifampin (RIFADIN) capsule 300 mg        300 mg Oral Every 12 hours 09/29/20 1131     09/26/20 1300  rifampin (RIFADIN) 300 mg in sodium chloride 0.9 % 100 mL IVPB  Status:  Discontinued        300 mg 200 mL/hr over 30 Minutes  Intravenous Every 12 hours 09/26/20 1156 09/29/20 1131   09/18/20 1400  metroNIDAZOLE (FLAGYL) tablet 500 mg        500 mg Oral Every 8 hours 09/18/20 0927     09/15/20 1715  tobramycin (NEBCIN) powder  Status:  Discontinued          As needed 09/15/20 1715 09/15/20 2043   09/15/20 1715  vancomycin (VANCOCIN) powder  Status:  Discontinued          As needed 09/15/20 1716 09/15/20 2043   09/14/20 0600  ceFAZolin (ANCEF) IVPB 2g/100 mL premix  Status:  Discontinued        2 g 200 mL/hr  over 30 Minutes Intravenous Every 8 hours 09/14/20 0359 10/01/20 1602   09/13/20 2200  vancomycin (VANCOREADY) IVPB 1500 mg/300 mL  Status:  Discontinued        1,500 mg 150 mL/hr over 120 Minutes Intravenous Every 12 hours 09/13/20 1636 09/14/20 0359   09/13/20 1500  ceFEPIme (MAXIPIME) 2 g in sodium chloride 0.9 % 100 mL IVPB  Status:  Discontinued        2 g 200 mL/hr over 30 Minutes Intravenous Every 8 hours 09/13/20 1437 09/14/20 0359   09/13/20 1130  vancomycin (VANCOREADY) IVPB 1750 mg/350 mL        1,750 mg 175 mL/hr over 120 Minutes Intravenous  Once 09/13/20 1107 09/13/20 1414   09/13/20 1045  ceFAZolin (ANCEF) IVPB 1 g/50 mL premix        1 g 100 mL/hr over 30 Minutes Intravenous  Once 09/13/20 1031 09/13/20 1307       Subjective: Seen and examined at at bedside and states he feels about the same as today and continues to still complain of pain.  No nausea or vomiting.  States that he has not been coughing as much and not short of breath.  Thinks his abdomen is less distended but still mild volume overload.  Appears a little depressed and frustrated.  No other concerns or complaints at this time.  Objective: Vitals:   10/01/20 1750 10/01/20 2118 10/02/20 0136 10/02/20 0523  BP: 111/71 112/72 104/69 109/73  Pulse: (!) 115 (!) 125 (!) 103 98  Resp:  16 18 17   Temp: 99.2 F (37.3 C) 99.3 F (37.4 C) 98.8 F (37.1 C) 97.9 F (36.6 C)  TempSrc: Oral  Oral Oral  SpO2: (!) 89% 90% 92% 93%   Weight:      Height:        Intake/Output Summary (Last 24 hours) at 10/02/2020 1354 Last data filed at 10/02/2020 1000 Gross per 24 hour  Intake 1170 ml  Output 1770 ml  Net -600 ml   Filed Weights   09/21/20 0500 09/28/20 0545 10/01/20 0500  Weight: 93.9 kg 93.2 kg 92.4 kg   Examination: Physical Exam:  Constitutional: WN/WD overweight Caucasian male currently in no acute distress appears slightly uncomfortable and a little frustrated and depressed  Eyes: Lids and conjunctivae normal, sclerae anicteric  ENMT: External Ears, Nose appear normal. Grossly normal hearing.  Neck: Appears normal, supple, no cervical masses, normal ROM, no appreciable thyromegaly; no appreciable JVD Respiratory: Diminished to auscultation bilaterally with coarse breath sounds and some rhonchi, no wheezing, rales, rhonchi or crackles. Normal respiratory effort and patient is not tachypenic. No accessory muscle use.  Not wearing supplemental oxygen via nasal cannula Cardiovascular: Tachycardic rate but regular rhythm, no murmurs / rubs / gallops. S1 and S2 auscultated.Has 1-2+ LE Pitting Edema Abdomen: Soft, non-tender, Distended 2/2 body habitus. No masses palpated. No appreciable hepatosplenomegaly. Bowel sounds positive.  GU: Deferred.  Foley catheter is in place Musculoskeletal: No clubbing / cyanosis of digits/nails.  Left leg exam benign.  Skin: No rashes, lesions, ulcers on limited skin evaluation and I did not surgical wound. No induration; Warm and dry.  Neurologic: CN 2-12 grossly intact with no focal deficits. Romberg sign and cerebellar reflexes not assessed.  Psychiatric: Normal judgment and insight. Alert and oriented x 3.  Flat affect and slightly depressed mood   Data Reviewed: I have personally reviewed following labs and imaging studies  CBC: Recent Labs  Lab 09/28/20 0442 09/29/20 0502 09/30/20 0518 10/01/20  1540 10/02/20 0611  WBC 14.0* 15.6* 17.3* 14.4* 12.2*  NEUTROABS  --   11.2* 12.2* 10.5* 8.7*  HGB 7.7* 8.2* 8.1* 7.5* 8.1*  HCT 24.5* 26.1* 25.3* 23.5* 25.6*  MCV 104.3* 103.2* 102.4* 101.7* 103.6*  PLT 176 183 202 201 086   Basic Metabolic Panel: Recent Labs  Lab 09/28/20 0442 09/29/20 0502 09/30/20 0518 10/01/20 0537 10/02/20 0611  NA 130* 129* 128* 128* 128*  K 4.1 4.4 4.0 3.7 3.7  CL 100 100 98 96* 96*  CO2 21* 20* 21* 24 22  GLUCOSE 130* 88 86 91 97  BUN 14 14 16 9 12   CREATININE 0.41* 0.33* 0.35* 0.35* 0.36*  CALCIUM 7.6* 7.8* 7.7* 7.6* 7.7*  MG 1.7 1.6* 1.6* 1.7 1.7  PHOS 2.5 3.3 3.9 3.8 3.8   GFR: Estimated Creatinine Clearance: 145.9 mL/min (A) (by C-G formula based on SCr of 0.36 mg/dL (L)). Liver Function Tests: Recent Labs  Lab 09/28/20 0442 09/29/20 0502 09/30/20 0518 10/01/20 0537 10/02/20 0611  AST 28 28 30 26 30   ALT 8 7 8 7 8   ALKPHOS 235* 189* 181* 152* 170*  BILITOT 0.9 1.0 1.2 1.0 0.9  PROT 5.3* 5.3* 5.4* 5.2* 5.5*  ALBUMIN 1.9* 1.8* 1.9* 2.1* 2.1*   No results for input(s): LIPASE, AMYLASE in the last 168 hours. No results for input(s): AMMONIA in the last 168 hours. Coagulation Profile: Recent Labs  Lab 09/29/20 1704  INR 1.4*   Cardiac Enzymes: No results for input(s): CKTOTAL, CKMB, CKMBINDEX, TROPONINI in the last 168 hours. BNP (last 3 results) No results for input(s): PROBNP in the last 8760 hours. HbA1C: No results for input(s): HGBA1C in the last 72 hours. CBG: No results for input(s): GLUCAP in the last 168 hours. Lipid Profile: No results for input(s): CHOL, HDL, LDLCALC, TRIG, CHOLHDL, LDLDIRECT in the last 72 hours. Thyroid Function Tests: No results for input(s): TSH, T4TOTAL, FREET4, T3FREE, THYROIDAB in the last 72 hours. Anemia Panel: No results for input(s): VITAMINB12, FOLATE, FERRITIN, TIBC, IRON, RETICCTPCT in the last 72 hours. Sepsis Labs: No results for input(s): PROCALCITON, LATICACIDVEN in the last 168 hours.  Recent Results (from the past 240 hour(s))  Body fluid culture  w Gram Stain     Status: None (Preliminary result)   Collection Time: 09/30/20 12:55 PM   Specimen: Peritoneal Washings; Body Fluid  Result Value Ref Range Status   Specimen Description   Final    PERITONEAL Performed at [facility] [address], [place], [place] [address]    Special Requests   Final    Normal Performed at [facility], [place] [address], [place], [place] [address]    Gram Stain NO WBC SEEN NO ORGANISMS SEEN   Final   Culture   Final    NO GROWTH 2 DAYS Performed at [facility], [place] [address] [place], [place] [address]    Report Status PENDING  Incomplete  Aerobic/Anaerobic Culture (surgical/deep wound)     Status: None (Preliminary result)   Collection Time: 09/30/20  4:56 PM   Specimen: Abscess  Result Value Ref Range Status   Specimen Description   Final    ABSCESS THIGH Performed at [facility] [address] [place], [place] [address]    Special Requests   Final    NONE Performed at [facility], [place] [address], [place], [place] [address]    Gram Stain   Final    ABUNDANT  WBC PRESENT,BOTH PMN AND MONONUCLEAR ABUNDANT GRAM POSITIVE COCCI IN PAIRS    Culture   Final    CULTURE REINCUBATED FOR BETTER GROWTH Performed at [facility], [place] [address] [place], [place] [address]    Report Status PENDING  Incomplete     RN Pressure Injury Documentation:     Estimated body mass index is 29.23 kg/m as calculated from the following:   Height as of this encounter: 5' 10"  (1.778 m).   Weight as of this encounter: 92.4 kg.  Malnutrition Type:  Nutrition Problem: Increased nutrient needs Etiology: chronic illness   Malnutrition Characteristics:  Signs/Symptoms: estimated needs   Nutrition Interventions:  Interventions: Juven,Boost Breeze,Prostat     Radiology Studies: CT CHEST WO CONTRAST  Result Date: 09/30/2020 CLINICAL DATA:  Fluid  collection, drain placement EXAM: CT CHEST WITHOUT CONTRAST TECHNIQUE: Multidetector CT imaging of the chest was performed following the standard protocol without IV contrast. COMPARISON:  09/30/2020 FINDINGS: Cardiovascular: Unenhanced imaging of the heart great vessels demonstrates no pericardial effusion. Normal caliber of the thoracic aorta. Mediastinum/Nodes: No enlarged mediastinal or axillary lymph nodes. Thyroid gland, trachea, and esophagus demonstrate no significant findings. Lungs/Pleura: There is patchy bilateral upper lobe predominant airspace disease consistent with multifocal pneumonia. Trace left pleural effusion. No pneumothorax. Central airways are patent. Upper Abdomen: Hepatomegaly with nodular liver contour consistent with cirrhosis unchanged. Small volume upper abdominal ascites, greatest in the right upper quadrant. No other acute upper abdominal finding. Musculoskeletal: No acute or destructive bony lesions. Subcutaneous edema within the bilateral flanks. Reconstructed images demonstrate no additional findings. IMPRESSION: 1. Patchy bilateral upper lobe predominant airspace disease consistent with multifocal pneumonia. 2. Trace left pleural effusion. 3. Cirrhosis. 4. Trace ascites. Electronically Signed   By: Randa Ngo M.D.   On: 09/30/2020 19:16   Scheduled Meds: . (feeding supplement) PROSource Plus  30 mL Oral BID BM  . Chlorhexidine Gluconate Cloth  6 each Topical Daily  . docusate sodium  100 mg Oral BID  . feeding supplement  1 Container Oral BID BM  . folic acid  1 mg Oral Daily  . furosemide  40 mg Oral Daily  . guaiFENesin  1,200 mg Oral BID  . hydrocortisone cream   Topical BID  . lactulose  10 g Oral BID  . mouth rinse  15 mL Mouth Rinse BID  . metoprolol tartrate  25 mg Oral BID  . metroNIDAZOLE  500 mg Oral Q8H  . morphine  15 mg Oral Q12H  . multivitamin with minerals  1 tablet Oral Daily  . nutrition supplement (JUVEN)  1 packet Oral BID BM  .  pantoprazole  40 mg Oral BID  . rifampin  300 mg Oral Q12H  . senna  1 tablet Oral BID  . sertraline  50 mg Oral Daily  . sodium chloride flush  5 mL Intracatheter Q8H  . sodium chloride flush  5 mL Intracatheter Q8H  . spironolactone  100 mg Oral Daily  . thiamine  100 mg Oral Daily   Continuous Infusions: .  ceFAZolin (ANCEF) IV Stopped (10/02/20 0558)    LOS: 19 days    Kerney Elbe, DO [facility] PAGER is on [place]  If 7PM-7AM, please contact night-coverage [email or web address]

## 2020-10-03 DIAGNOSIS — K6812 Psoas muscle abscess: Secondary | ICD-10-CM | POA: Diagnosis not present

## 2020-10-03 DIAGNOSIS — F1029 Alcohol dependence with unspecified alcohol-induced disorder: Secondary | ICD-10-CM | POA: Diagnosis not present

## 2020-10-03 DIAGNOSIS — K7011 Alcoholic hepatitis with ascites: Secondary | ICD-10-CM | POA: Diagnosis not present

## 2020-10-03 DIAGNOSIS — B9561 Methicillin susceptible Staphylococcus aureus infection as the cause of diseases classified elsewhere: Secondary | ICD-10-CM | POA: Diagnosis not present

## 2020-10-03 DIAGNOSIS — M009 Pyogenic arthritis, unspecified: Secondary | ICD-10-CM | POA: Diagnosis not present

## 2020-10-03 DIAGNOSIS — R7881 Bacteremia: Secondary | ICD-10-CM | POA: Diagnosis not present

## 2020-10-03 LAB — MAGNESIUM: Magnesium: 1.5 mg/dL — ABNORMAL LOW (ref 1.7–2.4)

## 2020-10-03 LAB — COMPREHENSIVE METABOLIC PANEL
ALT: 7 U/L (ref 0–44)
AST: 30 U/L (ref 15–41)
Albumin: 1.9 g/dL — ABNORMAL LOW (ref 3.5–5.0)
Alkaline Phosphatase: 134 U/L — ABNORMAL HIGH (ref 38–126)
Anion gap: 7 (ref 5–15)
BUN: 13 mg/dL (ref 6–20)
CO2: 23 mmol/L (ref 22–32)
Calcium: 7.8 mg/dL — ABNORMAL LOW (ref 8.9–10.3)
Chloride: 97 mmol/L — ABNORMAL LOW (ref 98–111)
Creatinine, Ser: <0.3 mg/dL — ABNORMAL LOW (ref 0.61–1.24)
Glucose, Bld: 97 mg/dL (ref 70–99)
Potassium: 3.8 mmol/L (ref 3.5–5.1)
Sodium: 127 mmol/L — ABNORMAL LOW (ref 135–145)
Total Bilirubin: 0.9 mg/dL (ref 0.3–1.2)
Total Protein: 4.8 g/dL — ABNORMAL LOW (ref 6.5–8.1)

## 2020-10-03 LAB — CBC WITH DIFFERENTIAL/PLATELET
Abs Immature Granulocytes: 0.25 10*3/uL — ABNORMAL HIGH (ref 0.00–0.07)
Basophils Absolute: 0 10*3/uL (ref 0.0–0.1)
Basophils Relative: 0 %
Eosinophils Absolute: 0.2 10*3/uL (ref 0.0–0.5)
Eosinophils Relative: 2 %
HCT: 24.6 % — ABNORMAL LOW (ref 39.0–52.0)
Hemoglobin: 7.8 g/dL — ABNORMAL LOW (ref 13.0–17.0)
Immature Granulocytes: 2 %
Lymphocytes Relative: 18 %
Lymphs Abs: 2.2 10*3/uL (ref 0.7–4.0)
MCH: 32.4 pg (ref 26.0–34.0)
MCHC: 31.7 g/dL (ref 30.0–36.0)
MCV: 102.1 fL — ABNORMAL HIGH (ref 80.0–100.0)
Monocytes Absolute: 1 10*3/uL (ref 0.1–1.0)
Monocytes Relative: 8 %
Neutro Abs: 9.1 10*3/uL — ABNORMAL HIGH (ref 1.7–7.7)
Neutrophils Relative %: 70 %
Platelets: 201 10*3/uL (ref 150–400)
RBC: 2.41 MIL/uL — ABNORMAL LOW (ref 4.22–5.81)
RDW: 18.3 % — ABNORMAL HIGH (ref 11.5–15.5)
WBC: 12.8 10*3/uL — ABNORMAL HIGH (ref 4.0–10.5)
nRBC: 0.2 % (ref 0.0–0.2)

## 2020-10-03 LAB — CYTOLOGY - NON PAP

## 2020-10-03 LAB — PHOSPHORUS: Phosphorus: 3.4 mg/dL (ref 2.5–4.6)

## 2020-10-03 MED ORDER — IPRATROPIUM-ALBUTEROL 0.5-2.5 (3) MG/3ML IN SOLN
RESPIRATORY_TRACT | Status: AC
  Administered 2020-10-03: 3 mL
  Filled 2020-10-03: qty 3

## 2020-10-03 MED ORDER — MAGNESIUM SULFATE 50 % IJ SOLN
3.0000 g | Freq: Once | INTRAVENOUS | Status: AC
  Administered 2020-10-03: 3 g via INTRAVENOUS
  Filled 2020-10-03: qty 6

## 2020-10-03 MED ORDER — IPRATROPIUM-ALBUTEROL 0.5-2.5 (3) MG/3ML IN SOLN: 3.0000 mL | RESPIRATORY_TRACT | Status: DC | PRN

## 2020-10-03 NOTE — Progress Notes (Signed)
PROGRESS NOTE    [name]  KGY:[id number] DOB: [birth year] DOA: 09/13/2020 PCP: [name], PA   Brief Narrative:  The patient is a 35 year old male with past medical history of AVN of the hip, hypertension, GERD he was recently admitted on 08/22/2020 for COVID-19 and was discharged on 08/29/2020. 2 days after discharge patient began to feel extremely weak and tired with symptoms progressing over next 2 weeks. Patient developed left thigh rash and leg pain with swelling and tenderness to touch. Patient was seen at PCP office and was told to go to the ED immediately. He was found to have left iliopsoas and left gluteal abscess with diffuse swelling and left hip effusion on CT scan. General surgery was consulted. Blood cultures positive for MSSA bacteremia. He underwent drainage of the abscess with 3 drain placement by IR in his iliopsoas muscle and multiple abscesses in the legs. On 09/15/2020 had left hip interposition arthroplasty with antibiotic spacer placement and debridement and developed decompensated septic shock with refractory hypotension despite 2 units PRBC, 2.2 L of crystalloid and 500 cc of albumin. He was admitted to ICU with vasopressor support. ID has been consulted and recommend 6 weeks of IV cefazolin.Patient also had scrotal edema anasarca in the setting of IV fluid resuscitation. Ultrasound was unremarkable for abscess but showed marked asymmetric left scrotal wall thickening and fluid with increased blood flow consistent with soft tissue infection.  Patient had multiple incision and drainage done by general surgery and also multiple aspiration of the abscesses with drain placement per IR.  He continues to need abscess drainage and wound VAC placement as per general surgery, Ortho and IR.  Because his abdomen was more distended and CT scan of the abdomen pelvis done last week showed moderate to large ascites we will consult GI.  I discussed with them about obtaining  a ultrasound of the abdomen and they will order also ordered a paracentesis.  Patient was given a dose of IV Lasix given his anasarca now.  GI has started the patient on a 2 g sodium diet and also started the patient on Lasix 40 minutes p.o. daily as well as Aldactone 100 g p.o. daily and follow his renal function carefully for the next few days.  Paracentesis been ordered and they are recommending full labs with cell count, cytology, protein, LDH and albumin and recommendations for weigh the patient daily.  Patient underwent his paracentesis on 09/30/2020 today drains started putting out less output interventional radiology is going to obtain a follow-up CT of the abdomen pelvis as well as a left femur. He had additional drains placed on 09/30/20 and Spiked a Temperature on 10/01/20 so Blood Cx were reordered.  We are continuing dressing changes per Ortho and general surgery recommendations and wound nurse has been consulted and they want to try and apply wound vacs with all 3 of the wounds to see if they can close out faster. Infectious diseases still recommending current regimen with cefazolin and metronidazole and rifampin.  Assessment & Plan:   Principal Problem:   MSSA bacteremia Active Problems:   Alcohol dependence ([place])   Alcoholic hepatitis   Sepsis ([place])   Septic arthritis ([place])   Iliopsoas abscess on left ([place])  Iliopsoas and left gluteal abscesses associated with MSSA Bacteremia and Groin Abscess Septic shock secondary to above and left hip septic arthritis and MSSA bacteremia Left hip effusion and septic arthritis in the setting of avascular necrosis -Admitted to Inpatient Telemetry  -Initially started on  IV vanc, cefepime; pharmacy to dose but changed to IV Cefazolin by ID and now on IV Cefazolin and IV Metronidazole -Repeat Blood Cx x2 and obtain ECHO; See below -Initial Blood Cx 09/13/20 showed Staph Aureus in Both sets of Cx's; Repeated Blood Cx 10/01/20 -Sepsis Criteria met with:  tachycardia, tachypnea, lactic acid 3.3, and Leukocytosis was 10.6 source: abscesses as above as well as bacteremia and went into septic shock during this admission -WBC was trending up and trended up to 17.3 and is now 14.4 and further improved to 12.2 yesterday and today it is 12.8 -General surgery, interventional radiology, orthopedic surgery, infectious disease all consulted and appreciate their assistance -He has now 3 drains in place -Patient underwent a transesophageal echocardiogram that did not mention any vegetation -Repeat blood cultures have been done and show NGTD at 5 Days; since if TTE is negative they can recommend a transesophageal echocardiogram then but since his TTE was a difficult Study, ID Dr. Juleen China recommending TEE given Disseminated MSSA infection so he underwent a transesophageal echocardiogram on Monday, 09/19/2020. -Because of this orthopedic surgery reviewed the prior CT scan and they felt the best course of action is to take the patient for an open irrigation debridement of the medial and lateral thigh to express these fluid collections he went back to the OR on 09/21/2020 as well as 09/27/2020 neurosurgery was also consulted given that infection had spread into the left groin -IR recommending Repeating CT Abd/Pelvis and Femur 09/30/20 because WBC is also going up and was done yesterday persistent undrained left cardiac cath and left anterolateral intramuscular fluid collections with peripheral enhancement concerning for abscesses that were amenable to CT-guided aspiration: CT-guided aspiration and drainage placement was done -CT scan also showed resolution of the psoas abscess drain after placement and there is persistent small peripheral enhancing fluid collections around the indwelling anterior thigh drains -a CT scan of the abdomen and pelvis also did note the patient had interval development of groundglass and consolidative opacities in the mid lungs bilaterally and a CT of the  chest was dedicated to this and done and showed patchy bilateral upper lobe predominant airspace disease consistent with multifocal pneumonia -Conntinue with antibiotics for now but patient did spike a temperature yesterday afternoon on 10/01/2020 so we will repeat his blood cultures and reconsult ID for further evaluation -LE Venous Duplex repeated and showed no DVT but portions were limited  -General surgery is recommending continue antibiotics as well as dressing changes twice daily -Continue PT OT evaluation and continue dressing changes per orthopedic surgery and VT prophylaxis further recommendations but currently this is held given his anemia and thrombocytopenia -PT/OT recommending CIR but once he is only cleared from a medical perspective and once orthopedic surgery signed off the case and has no further debridement plans and once he is stable from that perspective  Chronic Pain -pain is not controlled, will start MS Contin 15 mg p.o. twice daily, discontinue oxycodone.   -We will continue with Dilaudid 1 mg IV every 4 hours as needed for breakthrough pain  Symptomatic Anemia Macrocytic Anemia -Presented with Hgb/Hct of 6.2/18.9 -Patient is status post 8 units of PRBCs total  Patient is hemoglobin/hematocrit is relatively stable at 8.1/25.6 yesterday and today 7.8/24.6 -FOBT negative -Anemia panel done and showed an iron level of 44, U IBC 131, TIBC 175, saturation ratios of 25% -check LDH and was 308, haptoglobin pending, iron studies showed an iron level of 44, U IBC 131, TIBC 175, saturation ratios of  25% -Continue to monitor for signs and symptoms of bleeding; currently no overt bleeding noted  -repeat CBC in a.m.  Hx of Alcoholic Hepatitis and alcohol abuse Hepatic Cirrhosis ascites with anasarca and volume overload Hyperbilirubinemia, was improving and now worsening  -Was continiung Prednisolone but this was discontinued due to disseminated MSSA bacteremia. -LFTs have  normalized and so has his T bili but he continues to have some moderate ascites to large ascites noted on his CT scan and is more distended today with his anasarca in the setting of his hepatic cirrhosis -Patient and family report he has been alcohol free for 6 weeks -Continue lactulose 10 g p.o. twice daily, thiamine, magnesium -abdomen appears more distended appear more fluid-filled today -Will have GI weigh in for addition of spironolactone and furosemide and defer to them to obtain a ultrasound of the abdomen to evaluate his ascites as patient may need a paracentesis and fluid studies -GI have asked IR to do a paracentesis and this was done on 09/30/2020 with removal of 3 L maximum and fluid was sent for analysis studies; fluid analysis studies were not consistent with SBP; may need to repeat paracentesis -GI is also added the patient on Lasix 40 mg p.o. daily and spironolactone 100 g p.o. daily and will need to continue monitor his renal function closely -May need to more diuresis given his anasarca and his given albumin the day before yesterday -INR was 1.4 -Continue monitor hepatic function daily and has bilirubin and his LFTs are normalized -Still remains extremely volume overloaded but thankfully has now normalized  Hx of Tachycardia; Now in Sinus Tachycardia intermittently HTN  -Baseline heart rate is anywhere between 100-110 per family -We will change carvedilol 6.25 mg p.o. twice daily to Metoprolol tartrate 25 mg p.o. twice daily and will further increase to 50 mg p.o. twice daily with good control of his heart rates  GAD -Increase sertraline dose to 50 mg p.o. nightly yesterday  GERD -C/w Pantoprazole 40 mg p.o. twice daily and will continue for now   Hyponatremia -Initiated on IV fluid when he was in septic shock but now this to stop and he was given diuresis -Sodium is now relatively stable but dropped a little bit and is now 127 today -GI has now added 40 mg of p.o.  Lasix as well as 100 mg spironolactone p.o. daily -Continue to monitor and trend and repeat CMP in a.m.  Metabolic Acidosis -Mild  -Patient CO2 is now  23, anion gap is 7, chloride level is 97 -We have stopped his IV fluid hydration and he is now on diuretics as above -Continue to monitor and trend and repeat CMP in a.m.  Hypophosphatemia -Patient's phosphorus level is now stable at 3.4 -Continue to monitor and replete as necessary -Repeat phosphorus level in a.m.  Hypomagnesemia -Patient magnesium was 1.5 -Replete with IV mag sulfate 3 g again today  -Continue to monitor and replete as necessary -Repeat Mag Level in the AM   Scrotal Edema and Anasarca, persistent -Patient was positive fluid balance prior to him getting a paracentesis and now is getting diuresed and is now - 5.177 which still remains significantly I am overload and anasarca. -In the setting of his hepatic cirrhosis and IV fluid resuscitation as well as his infection; now has a lot of ascites -Stopped IV fluids and recommend scrotal elevation and sling and obtain a scrotal ultrasound and Doppler to rule out any pathology -Initial scrotal U/S done and showed "No evidence of testicular  mass or torsion. Marked asymmetric left scrotal wall thickening and fluid, with increased blood flow. This is likely due to soft tissue infection, and part of the much more widespread process in the left pelvis and thigh demonstrated on prior CT." -Foley catheter has been removed but now has been placed back in -Repeat ultrasound done yesterday was concerning for soft tissue infection but urology evaluated and appreciate Dr. Lynne Logan evaluation and feels that this appears to be related to the regional edema from his leg and inguinal infection as well as edema.  There is no abscess on recent ultrasound and no evidence of scrotal skin infection and they recommended continue to monitor observe and recommending continuing for a long is a  difficult for the patient ambulate for using urinal -Continue with Extremity Elevation and will give a dose of IV Lasix yesterday and he has p.o. Lasix and Aldactone added by gastroenterology  Thrombocytopenia  -Patient's platelet count has now normalized and is 201,000 now -Continue to monitor for signs and symptoms of bleeding; currently no overt bleeding noted -Repeat CBC in a.m.  DVT prophylaxis: SCDs Code Status: FULL CODE Family Communication: Discussed with his girlfriend at bedside today Disposition Plan: Pending further clinical improvement will go to CIR  Status is: Inpatient  Remains inpatient appropriate because:Unsafe d/c plan, IV treatments appropriate due to intensity of illness or inability to take PO and Inpatient level of care appropriate due to severity of illness   Dispo: The patient is from: Home              Anticipated d/c is to: CIR              Patient currently is not medically stable to d/c.   Difficult to place patient No  Consultants:   General Surgery  Urology  Orthopedic Surgery  Interventional Radiology  Infectious Diseases   Gastroenterology    Procedures:  TOTAL HIP ARTHROPLASTY WITH CEMENT INTERPOSITION SPACINGfor septic hip arthritis from septicemia in the setting of alcoholic AVN of the left hip  Drainage of the abscess with 3 drain placement by IR in iliopsoas muscle and multiple abscesses in the legs  PROCEDURE by Dr. Mardelle Matte on 09/27/20: Left thigh irrigation and debridement, excisional debridement, skin, subcutaneous tissue, muscle with partial closure  Procedure: Incision and drainage of left groin abscess and left thigh abscess done by Dr. Romana Juniper on 09/27/20  Paracentesis done on 09/30/2020 performed yielding 3 liters (maximum ordered) of clear, light yellow fluid. No immediate complications.  A portion of the fluid was submitted to the lab for preordered studies.EBL none.   Antimicrobials:  Anti-infectives (From  admission, onward)   Start     Dose/Rate Route Frequency Ordered Stop   10/01/20 2200  ceFAZolin (ANCEF) IVPB 2g/100 mL premix        2 g 200 mL/hr over 30 Minutes Intravenous Every 8 hours 10/01/20 1602     09/29/20 1700  rifampin (RIFADIN) capsule 300 mg        300 mg Oral Every 12 hours 09/29/20 1131     09/26/20 1300  rifampin (RIFADIN) 300 mg in sodium chloride 0.9 % 100 mL IVPB  Status:  Discontinued        300 mg 200 mL/hr over 30 Minutes Intravenous Every 12 hours 09/26/20 1156 09/29/20 1131   09/18/20 1400  metroNIDAZOLE (FLAGYL) tablet 500 mg        500 mg Oral Every 8 hours 09/18/20 0927     09/15/20 1715  tobramycin (NEBCIN) powder  Status:  Discontinued          As needed 09/15/20 1715 09/15/20 2043   09/15/20 1715  vancomycin (VANCOCIN) powder  Status:  Discontinued          As needed 09/15/20 1716 09/15/20 2043   09/14/20 0600  ceFAZolin (ANCEF) IVPB 2g/100 mL premix  Status:  Discontinued        2 g 200 mL/hr over 30 Minutes Intravenous Every 8 hours 09/14/20 0359 10/01/20 1602   09/13/20 2200  vancomycin (VANCOREADY) IVPB 1500 mg/300 mL  Status:  Discontinued        1,500 mg 150 mL/hr over 120 Minutes Intravenous Every 12 hours 09/13/20 1636 09/14/20 0359   09/13/20 1500  ceFEPIme (MAXIPIME) 2 g in sodium chloride 0.9 % 100 mL IVPB  Status:  Discontinued        2 g 200 mL/hr over 30 Minutes Intravenous Every 8 hours 09/13/20 1437 09/14/20 0359   09/13/20 1130  vancomycin (VANCOREADY) IVPB 1750 mg/350 mL        1,750 mg 175 mL/hr over 120 Minutes Intravenous  Once 09/13/20 1107 09/13/20 1414   09/13/20 1045  ceFAZolin (ANCEF) IVPB 1 g/50 mL premix        1 g 100 mL/hr over 30 Minutes Intravenous  Once 09/13/20 1031 09/13/20 1307       Subjective: Seen and examined at at bedside and feels okay today.  Thinks he is less swollen.  No nausea or vomiting.  Has not been coughing.  Continues to have abdominal distention still.  No other concerns or complaints at this  time.  Objective: Vitals:   10/02/20 2119 10/03/20 0506 10/03/20 1058 10/03/20 1357  BP: 121/79 112/71  110/73  Pulse: (!) 123 100  84  Resp: 18 18  18   Temp: 98.1 F (36.7 C) 99.2 F (37.3 C)  99.7 F (37.6 C)  TempSrc: Oral Oral  Oral  SpO2: 93% 92% 91% 90%  Weight:      Height:        Intake/Output Summary (Last 24 hours) at 10/03/2020 1903 Last data filed at 10/03/2020 1800 Gross per 24 hour  Intake 1150.17 ml  Output 2500 ml  Net -1349.83 ml   Filed Weights   09/21/20 0500 09/28/20 0545 10/01/20 0500  Weight: 93.9 kg 93.2 kg 92.4 kg   Examination: Physical Exam:  Constitutional: WN/WD overweight Caucasian male currently no acute distress appears calm but mildly uncomfortable  Eyes: Lids and conjunctivae normal, sclerae anicteric  ENMT: External Ears, Nose appear normal. Grossly normal hearing.  Neck: Appears normal, supple, no cervical masses, normal ROM, no appreciable thyromegaly; no JVD Respiratory: Diminished to auscultation bilaterally, no wheezing, rales, rhonchi or crackles. Normal respiratory effort and patient is not tachypenic. No accessory muscle use.  Unlabored breathing and is not tachypneic Cardiovascular: Mildly tachycardic rate, no murmurs / rubs / gallops. S1 and S2 auscultated.  Has 1+ lower extremity edema Abdomen: Soft, non-tender, distended secondary body habitus. Bowel sounds positive.  GU: Deferred. Musculoskeletal: No clubbing / cyanosis of digits/nails.  Left leg Skin: Did not view his wounds today as he had a dressing change with general surgery and orthopedic surgery.. No induration; Warm and dry.  Neurologic: CN 2-12 grossly intact with no focal deficits. Romberg sign and cerebellar reflexes not assessed.  Psychiatric: Normal judgment and insight. Alert and oriented x 3. Normal mood and appropriate affect.   Data Reviewed: I have personally reviewed following labs and imaging  studies  CBC: Recent Labs  Lab 09/29/20 0502 09/30/20 0518  10/01/20 0537 10/02/20 0611 10/03/20 0422  WBC 15.6* 17.3* 14.4* 12.2* 12.8*  NEUTROABS 11.2* 12.2* 10.5* 8.7* 9.1*  HGB 8.2* 8.1* 7.5* 8.1* 7.8*  HCT 26.1* 25.3* 23.5* 25.6* 24.6*  MCV 103.2* 102.4* 101.7* 103.6* 102.1*  PLT 183 202 201 199 836   Basic Metabolic Panel: Recent Labs  Lab 09/29/20 0502 09/30/20 0518 10/01/20 0537 10/02/20 0611 10/03/20 0422  NA 129* 128* 128* 128* 127*  K 4.4 4.0 3.7 3.7 3.8  CL 100 98 96* 96* 97*  CO2 20* 21* 24 22 23   GLUCOSE 88 86 91 97 97  BUN 14 16 9 12 13   CREATININE 0.33* 0.35* 0.35* 0.36* <0.30*  CALCIUM 7.8* 7.7* 7.6* 7.7* 7.8*  MG 1.6* 1.6* 1.7 1.7 1.5*  PHOS 3.3 3.9 3.8 3.8 3.4   GFR: CrCl cannot be calculated (This lab value cannot be used to calculate CrCl because it is not a number: <0.30). Liver Function Tests: Recent Labs  Lab 09/29/20 0502 09/30/20 0518 10/01/20 0537 10/02/20 0611 10/03/20 0422  AST 28 30 26 30 30   ALT 7 8 7 8 7   ALKPHOS 189* 181* 152* 170* 134*  BILITOT 1.0 1.2 1.0 0.9 0.9  PROT 5.3* 5.4* 5.2* 5.5* 4.8*  ALBUMIN 1.8* 1.9* 2.1* 2.1* 1.9*   No results for input(s): LIPASE, AMYLASE in the last 168 hours. No results for input(s): AMMONIA in the last 168 hours. Coagulation Profile: Recent Labs  Lab 09/29/20 1704  INR 1.4*   Cardiac Enzymes: No results for input(s): CKTOTAL, CKMB, CKMBINDEX, TROPONINI in the last 168 hours. BNP (last 3 results) No results for input(s): PROBNP in the last 8760 hours. HbA1C: No results for input(s): HGBA1C in the last 72 hours. CBG: No results for input(s): GLUCAP in the last 168 hours. Lipid Profile: No results for input(s): CHOL, HDL, LDLCALC, TRIG, CHOLHDL, LDLDIRECT in the last 72 hours. Thyroid Function Tests: No results for input(s): TSH, T4TOTAL, FREET4, T3FREE, THYROIDAB in the last 72 hours. Anemia Panel: No results for input(s): VITAMINB12, FOLATE, FERRITIN, TIBC, IRON, RETICCTPCT in the last 72 hours. Sepsis Labs: No results for input(s):  PROCALCITON, LATICACIDVEN in the last 168 hours.  Recent Results (from the past 240 hour(s))  Body fluid culture w Gram Stain     Status: None (Preliminary result)   Collection Time: 09/30/20 12:55 PM   Specimen: Peritoneal Washings; Body Fluid  Result Value Ref Range Status   Specimen Description   Final    PERITONEAL Performed at [facility] [address] [place], [place] [address]    Special Requests   Final    Normal Performed at [facility], [place] [address], [place], [place] [address]    Gram Stain NO WBC SEEN NO ORGANISMS SEEN   Final   Culture   Final    NO GROWTH 3 DAYS Performed at [facility], [place] [address], [place], [place] [address]    Report Status PENDING  Incomplete  Aerobic/Anaerobic Culture (surgical/deep wound)     Status: None (Preliminary result)   Collection Time: 09/30/20  4:56 PM   Specimen: Abscess  Result Value Ref Range Status   Specimen Description   Final    ABSCESS THIGH Performed at [facility] [address], [place],  [address]    Special Requests   Final    NONE Performed at [facility], [address] [place], [place] [address]    Gram Stain   Final    ABUNDANT WBC PRESENT,BOTH PMN AND MONONUCLEAR ABUNDANT GRAM POSITIVE COCCI IN PAIRS Performed at [facility], [place] [address] [place], [place] [address]    Culture   Final    RARE STAPHYLOCOCCUS AUREUS NO ANAEROBES ISOLATED; CULTURE IN PROGRESS FOR 5 DAYS    Report Status PENDING  Incomplete   Organism ID, Bacteria STAPHYLOCOCCUS AUREUS  Final      Susceptibility   Staphylococcus aureus - MIC*    CIPROFLOXACIN <=0.5 SENSITIVE Sensitive     ERYTHROMYCIN <=0.25 SENSITIVE Sensitive     GENTAMICIN <=0.5 SENSITIVE Sensitive     OXACILLIN <=0.25 SENSITIVE Sensitive     TETRACYCLINE <=1 SENSITIVE Sensitive     VANCOMYCIN 1 SENSITIVE Sensitive     TRIMETH/SULFA <=10 SENSITIVE Sensitive      CLINDAMYCIN <=0.25 SENSITIVE Sensitive     RIFAMPIN >=32 RESISTANT Resistant     Inducible Clindamycin NEGATIVE Sensitive     * RARE STAPHYLOCOCCUS AUREUS  Culture, blood (routine x 2)     Status: None (Preliminary result)   Collection Time: 10/01/20  2:29 PM   Specimen: BLOOD RIGHT HAND  Result Value Ref Range Status   Specimen Description   Final    BLOOD RIGHT HAND Performed at [facility] [address] [place], [place] [address]    Special Requests   Final    BOTTLES DRAWN AEROBIC ONLY Blood Culture adequate volume Performed at [facility] [address], [place], [place] [address]    Culture   Final    NO GROWTH 2 DAYS Performed at [facility] [address], [place], [place] [address]    Report Status PENDING  Incomplete  Culture, blood (routine x 2)     Status: None (Preliminary result)   Collection Time: 10/01/20  2:29 PM   Specimen: BLOOD RIGHT FOREARM  Result Value Ref Range Status   Specimen Description   Final    BLOOD RIGHT FOREARM Performed at [facility] [address] [place], [place] [address]    Special Requests   Final    BOTTLES DRAWN AEROBIC ONLY Blood Culture adequate volume Performed at [facility] [address], [place], [place] [address]    Culture   Final    NO GROWTH 2 DAYS Performed at [facility] [address], [place], [place] [address]    Report Status PENDING  Incomplete     RN Pressure Injury Documentation:     Estimated body mass index is 29.23 kg/m as calculated from the following:   Height as of this encounter: 5' 10"  (1.778 m).   Weight as of this encounter: 92.4 kg.  Malnutrition Type:  Nutrition Problem: Increased nutrient needs Etiology: chronic illness   Malnutrition Characteristics:  Signs/Symptoms: estimated needs   Nutrition Interventions:  Interventions: [place]   Radiology  Studies: No results found. Scheduled Meds: . (feeding supplement) PROSource Plus  30 mL Oral BID BM  . Chlorhexidine Gluconate Cloth  6 each Topical Daily  . docusate sodium  100 mg Oral BID  . feeding supplement  1 Container Oral BID BM  . folic acid  1 mg Oral Daily  . furosemide  40 mg Oral Daily  . guaiFENesin  1,200 mg Oral BID  . hydrocortisone cream   Topical BID  . lactulose  10 g Oral BID  . mouth rinse  15 mL Mouth Rinse BID  . metoprolol tartrate  50 mg Oral BID  . metroNIDAZOLE  500 mg Oral Q8H  . morphine  15 mg Oral Q12H  . multivitamin with minerals  1 tablet Oral Daily  . nutrition supplement (JUVEN)  1 packet Oral BID BM  . pantoprazole  40 mg Oral BID  . rifampin  300 mg Oral Q12H  . senna  1 tablet Oral BID  . sertraline  50 mg Oral Daily  . sodium chloride flush  5 mL Intracatheter Q8H  . sodium chloride flush  5 mL Intracatheter Q8H  . spironolactone  100 mg Oral Daily  . thiamine  100 mg Oral Daily   Continuous Infusions: .  ceFAZolin (ANCEF) IV 2 g (10/03/20 1359)    LOS: 20 days    Kerney Elbe, DO [facility] PAGER is on [place]  If 7PM-7AM, please contact night-coverage [email or web address]

## 2020-10-03 NOTE — Progress Notes (Signed)
Subjective:  States pain was well controlled prior to dressing change, he had just received dilaudid before I arrived. Denies nausea, vomiting, CP, SOB, paraesthesias.   Objective:  PE: VITALS:   Vitals:   10/02/20 0523 10/02/20 1510 10/02/20 2119 10/03/20 0506  BP: 109/73 112/70 121/79 112/71  Pulse: 98 80 (!) 123 100  Resp: 17 18 18 18   Temp: 97.9 F (36.6 C) 98.6 F (37 C) 98.1 F (36.7 C) 99.2 F (37.3 C)  TempSrc: Oral Oral Oral Oral  SpO2: 93% 98% 93% 92%  Weight:      Height:       General: Laying down in bed, in no acute distress MSK: left hip incision as seen in photo below, still draining serous fluid. Please see groin abscess photos below. Left thigh incision looks clean, no drainage.  Non tender left calf. Sensation intact to all aspects of left foot. + edema of left foot, though improved.+ DP pulse.Dorsiflexion/Plantar flexion intact.           LABS  Results for orders placed or performed during the hospital encounter of 09/13/20 (from the past 24 hour(s))  CBC with Differential/Platelet     Status: Abnormal   Collection Time: 10/03/20  4:22 AM  Result Value Ref Range   WBC 12.8 (H) 4.0 - 10.5 K/uL   RBC 2.41 (L) 4.22 - 5.81 MIL/uL   Hemoglobin 7.8 (L) 13.0 - 17.0 g/dL   HCT 10/05/20 (L) 51.0 - 25.8 %   MCV 102.1 (H) 80.0 - 100.0 fL   MCH 32.4 26.0 - 34.0 pg   MCHC 31.7 30.0 - 36.0 g/dL   RDW 52.7 (H) 78.2 - 42.3 %   Platelets 201 150 - 400 K/uL   nRBC 0.2 0.0 - 0.2 %   Neutrophils Relative % 70 %   Neutro Abs 9.1 (H) 1.7 - 7.7 K/uL   Lymphocytes Relative 18 %   Lymphs Abs 2.2 0.7 - 4.0 K/uL   Monocytes Relative 8 %   Monocytes Absolute 1.0 0.1 - 1.0 K/uL   Eosinophils Relative 2 %   Eosinophils Absolute 0.2 0.0 - 0.5 K/uL   Basophils Relative 0 %   Basophils Absolute 0.0 0.0 - 0.1 K/uL   Immature Granulocytes 2 %   Abs Immature Granulocytes 0.25 (H) 0.00 - 0.07 K/uL  Comprehensive metabolic panel     Status: Abnormal   Collection  Time: 10/03/20  4:22 AM  Result Value Ref Range   Sodium 127 (L) 135 - 145 mmol/L   Potassium 3.8 3.5 - 5.1 mmol/L   Chloride 97 (L) 98 - 111 mmol/L   CO2 23 22 - 32 mmol/L   Glucose, Bld 97 70 - 99 mg/dL   BUN 13 6 - 20 mg/dL   Creatinine, Ser 10/05/20 (L) 0.61 - 1.24 mg/dL   Calcium 7.8 (L) 8.9 - 10.3 mg/dL   Total Protein 4.8 (L) 6.5 - 8.1 g/dL   Albumin 1.9 (L) 3.5 - 5.0 g/dL   AST 30 15 - 41 U/L   ALT 7 0 - 44 U/L   Alkaline Phosphatase 134 (H) 38 - 126 U/L   Total Bilirubin 0.9 0.3 - 1.2 mg/dL   GFR, Estimated NOT CALCULATED >60 mL/min   Anion gap 7 5 - 15  Magnesium     Status: Abnormal   Collection Time: 10/03/20  4:22 AM  Result Value Ref Range   Magnesium 1.5 (L) 1.7 - 2.4 mg/dL  Phosphorus  Status: None   Collection Time: 10/03/20  4:22 AM  Result Value Ref Range   Phosphorus 3.4 2.5 - 4.6 mg/dL    No results found.  Assessment/Plan: Lefthipseptic arthritis/AVN,multiple left thigh and retroperitonealabscesses - 2/23 - placement of drain into left retroperitoneal abscess and two into left thigh by IR - 2/24 -left hip total arthroplasty with cement interposition spacer - 3/1 - left thigh irrigation and debridement with wound vac placementby orthopedics, removal of left retroperitoneal drain - 3/3 - I&D of left groin abscess with change of wound vac by general surgery 3/4 - CT guided drainage of left thigh fluid collection by IR - 3/8 - left thigh repeat irrigation and debridement by orthopedics with partial closure of wound, incision and drainage of left thigh abscess/left groin abscessby general surgery 3/11 - Removal of left psoas drain, CT guided aspiration of left iliacus abscess, CT guided drain placement of left anterior thigh abscess by IR - WBATLLE, continue OT and PT as able -defer to primary forpharmacologic DVT prophylaxis due to coagulopathy,continueSCD's  - continue BID dressing changes to left hip woundwith gauze and ABD's - witnessed  dressing changed of groin today with general surgery, plan for wound vac placement this afternoon  Contact information:   Weekdays 8-5 Janine Ores, PA-C [phone] A fter hours and holidays please check [email or web address] for group call information for [facility]  Armida Sans 10/03/2020, 9:05 AM

## 2020-10-03 NOTE — Progress Notes (Signed)
PHYSICAL THERAPY  Per RN pt declines today due to pain.  Will attempt another day as schedule permits.  Felecia Shelling  PTA Acute  Rehabilitation Services Pager      [phone] Office      [phone]

## 2020-10-03 NOTE — Progress Notes (Signed)
6 Days Post-Op    CC: Fatigue, left leg pain  Subjective: New drain from IR over weekend.  Objective: Vital signs in last 24 hours: Temp:  [98.1 F (36.7 C)-99.2 F (37.3 C)] 99.2 F (37.3 C) (03/14 0506) Pulse Rate:  [80-123] 100 (03/14 0506) Resp:  [18] 18 (03/14 0506) BP: (112-121)/(70-79) 112/71 (03/14 0506) SpO2:  [92 %-98 %] 92 % (03/14 0506) Last BM Date: 10/02/20  Intake/Output from previous day: 03/13 0701 - 03/14 0700 In: 1270.3 [P.O.:960; I.V.:10; IV Piggyback:265.3] Out: 2290 [Urine:2250; Drains:40] Intake/Output this shift: No intake/output data recorded.  PE: Skin: left groin wounds are overall clean.  Mild amount of fibrin noted on inferior medial wound.  Superior wound on top of groin is clean.  Left thigh wound is 100% beefy red granulation tissue.  Lab Results:  Recent Labs    10/02/20 0611 10/03/20 0422  WBC 12.2* 12.8*  HGB 8.1* 7.8*  HCT 25.6* 24.6*  PLT 199 201    BMET Recent Labs    10/02/20 0611 10/03/20 0422  NA 128* 127*  K 3.7 3.8  CL 96* 97*  CO2 22 23  GLUCOSE 97 97  BUN 12 13  CREATININE 0.36* <0.30*  CALCIUM 7.7* 7.8*   PT/INR No results for input(s): LABPROT, INR in the last 72 hours.  Recent Labs  Lab 09/29/20 0502 09/30/20 0518 10/01/20 0537 10/02/20 0611 10/03/20 0422  AST 28 30 26 30 30   ALT 7 8 7 8 7   ALKPHOS 189* 181* 152* 170* 134*  BILITOT 1.0 1.2 1.0 0.9 0.9  PROT 5.3* 5.4* 5.2* 5.5* 4.8*  ALBUMIN 1.8* 1.9* 2.1* 2.1* 1.9*     Lipase     Component Value Date/Time   LIPASE 99 (H) 08/23/2020 1500     Medications: . (feeding supplement) PROSource Plus  30 mL Oral BID BM  . Chlorhexidine Gluconate Cloth  6 each Topical Daily  . docusate sodium  100 mg Oral BID  . feeding supplement  1 Container Oral BID BM  . folic acid  1 mg Oral Daily  . furosemide  40 mg Oral Daily  . guaiFENesin  1,200 mg Oral BID  . hydrocortisone cream   Topical BID  . lactulose  10 g Oral BID  . mouth rinse  15 mL Mouth  Rinse BID  . metoprolol tartrate  50 mg Oral BID  . metroNIDAZOLE  500 mg Oral Q8H  . morphine  15 mg Oral Q12H  . multivitamin with minerals  1 tablet Oral Daily  . nutrition supplement (JUVEN)  1 packet Oral BID BM  . pantoprazole  40 mg Oral BID  . rifampin  300 mg Oral Q12H  . senna  1 tablet Oral BID  . sertraline  50 mg Oral Daily  . sodium chloride flush  5 mL Intracatheter Q8H  . sodium chloride flush  5 mL Intracatheter Q8H  . spironolactone  100 mg Oral Daily  . thiamine  100 mg Oral Daily    Assessment/Plan Sinus tachycardia, chronic  Thrombocytopenia - resolved PMH alcoholic hepatitis Hepatic cirrhosis with ascites GAD GERD Anemia - hgb7.8  PMH AVN  Septic shock due to below on admission -now resolved s/p below procedures and IV abx MSSA bacteremia Septic arthritis of the left hip Iliopsoas and left proximal and medial thigh abscesses  09/14/20 Placement 14 F IR perc drain into left RP abscess, two 12 F drains placed in L thigh (Cx - staph) 09/15/20 Left total hip arthroplasty with  cement interposition spacer, Dr. Sena Slate 09/20/20 Left thigh irrigation and debridement with wound vac placement, Dr. Dion Saucier Incision and drainage of left groin abscess, wound VAC change left thigh 09/22/2020, Dr. Harriette Bouillon, POD #11 I&D of left groin abscess and thigh abscess 09/27/2020, Dr. Twana First, POD #6 - continue Abx per ID - wounds are clean.  Replace VAC at bedside today per WOC.  Will see 2 groin wounds MWF with VAC changes.  FEN -regular diet VTE -none currently ID -flagyl, ancef    LOS: 20 days    Letha Cape 10/03/2020 Please see Amion

## 2020-10-03 NOTE — Progress Notes (Signed)
Orthopedic Tech Progress Note Patient Details:  [name] [birth year] [id number]  Ortho Devices Ortho Device/Splint Location: Trapeze bar Ortho Device/Splint Interventions: Application   Post Interventions Patient Tolerated: Well Instructions Provided: Care of device,Adjustment of device   Saul Fordyce 10/03/2020, 7:49 PM

## 2020-10-03 NOTE — Progress Notes (Signed)
Inpatient Rehab Admissions Coordinator:  Pt not medically ready for CIR. Will continue to follow.   Shilo Pauwels Graves Madden, MS, CCC-SLP Admissions Coordinator [phone]  

## 2020-10-03 NOTE — Consult Note (Addendum)
WOC Nurse Consult Note: Surgical team following for assessment and plan of care for post-op wounds Reason for Consult: Consult requested to apply a Vac dressing to inner groin wounds in 2 sites and left thigh Wound type: 3 full thickness post-op wounds; all beefy red and moist, mod amt pink drainage, red and indurated surrounding wounds.  Attempted to apply Vac to inner groin wounds and I was unable to obtain a seal, despite use of a barier ring.  Location is in a crease and is constantly moist.  Continue moist gauze dressings to these 2 sites as previously ordered by the surgical team. Informed team of the events via secure chat. Measurement: Left thigh with 1 piece black foam applied after pt was medicated for pain.  Tolerated with mod amt discomfort. Cont suction on at .  Sutures intact to lower wound. Red and moist; 8X5X3cm with tunneling at 12:00 to 4 cm.  WOC team will plan dressing change Q M/W/F Cammie Mcgee MSN, RN, CWOCN, Mina, CNS [phone]

## 2020-10-03 NOTE — Progress Notes (Signed)
[facility]    Date of Admission:  09/13/2020   Total days of antibiotics: cefazolin/rif/metronidazole   ID: [name] is a 35 y.o. male with MSSA bacteremia with iliopsoas abscess, multiple left thigh and groin abscess s/p debridement on 3/8 Current wound dimensions and depth:             -  Inferior medial thigh: 5x4x8 cm. purulent fluid and non-viable fascia encountered in wound base. Debrided down to muscle.             - Superior medial thigh: 8x2x2cm             - 6 cm tunnel connecting these two wounds   Extent of non viable tissue: minimal, as above 2 x 2 x 0.5 cm 2/28 TEE no vegetation  Principal Problem:   MSSA bacteremia Active Problems:   Alcohol dependence (HCC)   Alcoholic hepatitis   Sepsis (HCC)   Septic arthritis (HCC)   Iliopsoas abscess on left (HCC)    Subjective: Still feels like he is having intermittent fevers overnight.tolerating oral abtx  Medications:  . (feeding supplement) PROSource Plus  30 mL Oral BID BM  . Chlorhexidine Gluconate Cloth  6 each Topical Daily  . docusate sodium  100 mg Oral BID  . feeding supplement  1 Container Oral BID BM  . folic acid  1 mg Oral Daily  . furosemide  40 mg Oral Daily  . guaiFENesin  1,200 mg Oral BID  . hydrocortisone cream   Topical BID  . lactulose  10 g Oral BID  . mouth rinse  15 mL Mouth Rinse BID  . metoprolol tartrate  50 mg Oral BID  . metroNIDAZOLE  500 mg Oral Q8H  . morphine  15 mg Oral Q12H  . multivitamin with minerals  1 tablet Oral Daily  . nutrition supplement (JUVEN)  1 packet Oral BID BM  . pantoprazole  40 mg Oral BID  . rifampin  300 mg Oral Q12H  . senna  1 tablet Oral BID  . sertraline  50 mg Oral Daily  . sodium chloride flush  5 mL Intracatheter Q8H  . sodium chloride flush  5 mL Intracatheter Q8H  . spironolactone  100 mg Oral Daily  . thiamine  100 mg Oral Daily    Objective: Vital signs in last 24 hours: Temp:  [98.1 F (36.7  C)-99.7 F (37.6 C)] 99.7 F (37.6 C) (03/14 1357) Pulse Rate:  [84-123] 84 (03/14 1357) Resp:  [18] 18 (03/14 1357) BP: (110-121)/(71-79) 110/73 (03/14 1357) SpO2:  [90 %-93 %] 90 % (03/14 1357)  Physical Exam  Constitutional: He is oriented to person, place, and time. He appears well-developed and well-nourished. No distress.  HENT:  Mouth/Throat: Oropharynx is clear and moist. No oropharyngeal exudate.  Cardiovascular: Normal rate, regular rhythm and normal heart sounds. Exam reveals no gallop and no friction rub.  No murmur heard.  Pulmonary/Chest: Effort normal and breath sounds normal. No respiratory distress. He has no wheezes.  Abdominal: Soft. Bowel sounds are normal. He exhibits no distension. There is no tenderness.  Ext: incision healing, sutures intact in left thigh plus 2 drains in place Neurological: He is alert and oriented to person, place, and time.  Skin: Skin is warm and dry. No rash noted. No erythema. piv in left upper arm Psychiatric: He has a normal mood and affect. His behavior is normal.     Lab Results Recent Labs  10/02/20 0611 10/03/20 0422  WBC 12.2* 12.8*  HGB 8.1* 7.8*  HCT 25.6* 24.6*  NA 128* 127*  K 3.7 3.8  CL 96* 97*  CO2 22 23  BUN 12 13  CREATININE 0.36* <0.30*   Liver Panel Recent Labs    10/02/20 0611 10/03/20 0422  PROT 5.5* 4.8*  ALBUMIN 2.1* 1.9*  AST 30 30  ALT 8 7  ALKPHOS 170* 134*  BILITOT 0.9 0.9   Sedimentation Rate No results for input(s): ESRSEDRATE in the last 72 hours. C-Reactive Protein No results for input(s): CRP in the last 72 hours.  Microbiology: 2/23 left retroperitoneal abscess: ABUNDANT STAPHYLOCOCCUS AUREUS  MODERATE BACTEROIDES ORALIS  FEW PREVOTELLA BIVIA  BETA LACTAMASE POSITIVE  3/12 blood cx NGTD at 48hrs 3/11 Staphylococcus aureus      MIC    CIPROFLOXACIN <=0.5 SENSI... Sensitive    CLINDAMYCIN <=0.25 SENS... Sensitive    ERYTHROMYCIN <=0.25 SENS... Sensitive    GENTAMICIN  <=0.5 SENSI... Sensitive    Inducible Clindamycin NEGATIVE  Sensitive    OXACILLIN <=0.25 SENS... Sensitive    RIFAMPIN >=32 RESIST... Resistant    TETRACYCLINE <=1 SENSITIVE  Sensitive    TRIMETH/SULFA <=10 SENSIT... Sensitive    VANCOMYCIN 1 SENSITIVE  Sensitive     2/22 blood cx Staphylococcus aureus      MIC    CIPROFLOXACIN <=0.5 SENSI... Sensitive    CLINDAMYCIN <=0.25 SENS... Sensitive    ERYTHROMYCIN <=0.25 SENS... Sensitive    GENTAMICIN <=0.5 SENSI... Sensitive    Inducible Clindamycin NEGATIVE  Sensitive    OXACILLIN <=0.25 SENS... Sensitive    RIFAMPIN <=0.5 SENSI... Sensitive    TETRACYCLINE <=1 SENSITIVE  Sensitive    TRIMETH/SULFA <=10 SENSIT... Sensitive    VANCOMYCIN <=0.5 SENSI... Sensitive      Studies/Results: No results found.   Assessment/Plan: 35 y.o. male who has a diagnosis of MSSA bacteremia with left hip osteomyelitis with psoas abscess tracking from the psoas down into the hip and pelvis and into the thigh/scrotum, now consistent with Polymicrobial abscess with MSSA bacteremia = continue with cefazolin plus metronidazole and rifampin  Different MSSA isolates = will continue with rifampin despite recent isolate is R.   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

## 2020-10-03 NOTE — Progress Notes (Signed)
Nutrition Follow-up  INTERVENTION:   - girlfriend to bring in [place] Protein shakes that patient was consuming PTA  - Boost BreezeBID, each supplement provides 250 kcal and 9 grams of protein  - 30 ml Prosource Plus BID, each supplement provides 100 kcal and 15 grams protein  - Juven BID, each packet provides 95 calories, 2.5 grams of protein (collagen), and 9.8 grams of carbohydrate (3 grams sugar); also contains 7 grams of L-arginine and L-glutamine, 300 mg vitamin C, 15 mg vitamin E, 1.2 mcg vitamin B-12, 9.5 mg zinc, 200 mg calcium, and 1.5 g Calcium Beta-hydroxy-Beta-methylbutyrate to support wound healing   NUTRITION DIAGNOSIS:   Increased nutrient needs related to chronic illness as evidenced by estimated needs.  Ongoing.  GOAL:   Patient will meet greater than or equal to 90% of their needs  Progressing.  MONITOR:   PO intake,Supplement acceptance,Labs,Weight trends,Skin  ASSESSMENT:   35 y.o. male who complains of acute on chronic left hip pain. He was scheduled for hip replacement surgery in early March, and has had a series of progressive medical complications including thrombocytopenia, liver failure, COVID-19, progressive inability to ambulate, as well as kidney failure.  He has had progressive edema in the left lower extremity.  He denies fevers or chills.  He denies IV drug use.  He has been decreasing his alcohol intake in preparation for his hip replacement surgery.  He has progressed now to the point where he cannot place weight on his left lower extremity, and is unable to function.  He was brought into the emergency room with severe anemia, requiring blood transfusion, jaundice, and left lower extremity swelling.  2/23: s/p CT guided aspiration/drainage of left thigh fluid collections in IR. 2/24: s/p left hip interposition arthroplasty and debridement 3/1: s/p left thigh I&D w/ wound VAC placement 3/3: s/p repeat I&D 3/8: s/p repeat I&D  Patient is  currently accepting protein supplements to aid in wound healing. Pt consumed 100% of lunch today.  Per surgery note, pt to have wound vacs applied to wounds.  Admission weight: 174 lbs. Current weight: 203 lbs.  Per nursing documentation, pt with mild-moderate BLE and BUE edema.   Medications: Colace, Folic acid, Lasix, Lactulose, Multivitamin with minerals daily, Senokot, Thiamine, IV Mg sulfate  Labs reviewed: Low Na, Mg  Diet Order:   Diet Order            Diet 2 gram sodium Room service appropriate? Yes; Fluid consistency: Thin  Diet effective now                 EDUCATION NEEDS:   No education needs have been identified at this time  Skin:  Skin Assessment: Skin Integrity Issues: Skin Integrity Issues:: Incisions Incisions: L thigh and groin (3/1)  Last BM:  2/27  Height:   Ht Readings from Last 1 Encounters:  09/19/20 5\' 10"  (1.778 m)    Weight:   Wt Readings from Last 1 Encounters:  10/01/20 92.4 kg    BMI:  Body mass index is 29.23 kg/m.  Estimated Nutritional Needs:   Kcal:  2200-2400 kcal  Protein:  125-140 grams  Fluid:  >/= 2.3 L/day  12/01/20, MS, RD, LDN Inpatient Clinical Dietitian Contact information available via Amion

## 2020-10-04 ENCOUNTER — Inpatient Hospital Stay (HOSPITAL_COMMUNITY): Payer: 59

## 2020-10-04 ENCOUNTER — Telehealth: Payer: Self-pay

## 2020-10-04 DIAGNOSIS — K7011 Alcoholic hepatitis with ascites: Secondary | ICD-10-CM | POA: Diagnosis not present

## 2020-10-04 DIAGNOSIS — K6812 Psoas muscle abscess: Secondary | ICD-10-CM | POA: Diagnosis not present

## 2020-10-04 DIAGNOSIS — F1029 Alcohol dependence with unspecified alcohol-induced disorder: Secondary | ICD-10-CM | POA: Diagnosis not present

## 2020-10-04 DIAGNOSIS — K7031 Alcoholic cirrhosis of liver with ascites: Secondary | ICD-10-CM | POA: Diagnosis not present

## 2020-10-04 DIAGNOSIS — K766 Portal hypertension: Secondary | ICD-10-CM | POA: Diagnosis not present

## 2020-10-04 DIAGNOSIS — K7030 Alcoholic cirrhosis of liver without ascites: Secondary | ICD-10-CM

## 2020-10-04 DIAGNOSIS — R7881 Bacteremia: Secondary | ICD-10-CM | POA: Diagnosis not present

## 2020-10-04 LAB — COMPREHENSIVE METABOLIC PANEL
ALT: 7 U/L (ref 0–44)
AST: 33 U/L (ref 15–41)
Albumin: 1.9 g/dL — ABNORMAL LOW (ref 3.5–5.0)
Alkaline Phosphatase: 153 U/L — ABNORMAL HIGH (ref 38–126)
Anion gap: 8 (ref 5–15)
BUN: 14 mg/dL (ref 6–20)
CO2: 24 mmol/L (ref 22–32)
Calcium: 7.8 mg/dL — ABNORMAL LOW (ref 8.9–10.3)
Chloride: 94 mmol/L — ABNORMAL LOW (ref 98–111)
Creatinine, Ser: 0.32 mg/dL — ABNORMAL LOW (ref 0.61–1.24)
GFR, Estimated: >60 mL/min (ref >60)
Glucose, Bld: 91 mg/dL (ref 70–99)
Potassium: 3.9 mmol/L (ref 3.5–5.1)
Sodium: 126 mmol/L — ABNORMAL LOW (ref 135–145)
Total Bilirubin: 0.9 mg/dL (ref 0.3–1.2)
Total Protein: 5.1 g/dL — ABNORMAL LOW (ref 6.5–8.1)

## 2020-10-04 LAB — CBC WITH DIFFERENTIAL/PLATELET
Abs Immature Granulocytes: 0.27 10*3/uL — ABNORMAL HIGH (ref 0.00–0.07)
Basophils Absolute: 0 10*3/uL (ref 0.0–0.1)
Basophils Relative: 0 %
Eosinophils Absolute: 0.2 10*3/uL (ref 0.0–0.5)
Eosinophils Relative: 2 %
HCT: 24.5 % — ABNORMAL LOW (ref 39.0–52.0)
Hemoglobin: 7.8 g/dL — ABNORMAL LOW (ref 13.0–17.0)
Immature Granulocytes: 2 %
Lymphocytes Relative: 20 %
Lymphs Abs: 2.6 10*3/uL (ref 0.7–4.0)
MCH: 32.2 pg (ref 26.0–34.0)
MCHC: 31.8 g/dL (ref 30.0–36.0)
MCV: 101.2 fL — ABNORMAL HIGH (ref 80.0–100.0)
Monocytes Absolute: 1 10*3/uL (ref 0.1–1.0)
Monocytes Relative: 8 %
Neutro Abs: 8.9 10*3/uL — ABNORMAL HIGH (ref 1.7–7.7)
Neutrophils Relative %: 68 %
Platelets: 223 10*3/uL (ref 150–400)
RBC: 2.42 MIL/uL — ABNORMAL LOW (ref 4.22–5.81)
RDW: 18 % — ABNORMAL HIGH (ref 11.5–15.5)
WBC: 13.1 10*3/uL — ABNORMAL HIGH (ref 4.0–10.5)
nRBC: 0 % (ref 0.0–0.2)

## 2020-10-04 LAB — BODY FLUID CULTURE W GRAM STAIN
Culture: NO GROWTH
Gram Stain: NONE SEEN
Special Requests: NORMAL

## 2020-10-04 LAB — PHOSPHORUS: Phosphorus: 4 mg/dL (ref 2.5–4.6)

## 2020-10-04 LAB — MAGNESIUM: Magnesium: 1.7 mg/dL (ref 1.7–2.4)

## 2020-10-04 MED ORDER — MAGNESIUM SULFATE 2 GM/50ML IV SOLN
2.0000 g | Freq: Once | INTRAVENOUS | Status: AC
  Administered 2020-10-04: 2 g via INTRAVENOUS
  Filled 2020-10-04: qty 50

## 2020-10-04 NOTE — Progress Notes (Signed)
PROGRESS NOTE    [name]  HKV:[id number] DOB: May 21, 1986 DOA: 09/13/2020 PCP: George Niece, PA   Brief Narrative:  The patient is a 35 year old male with past medical history of AVN of the hip, hypertension, GERD he was recently admitted on 08/22/2020 for COVID-19 and was discharged on 08/29/2020. 2 days after discharge patient began to feel extremely weak and tired with symptoms progressing over next 2 weeks. Patient developed left thigh rash and leg pain with swelling and tenderness to touch. Patient was seen at PCP office and was told to go to the ED immediately. He was found to have left iliopsoas and left gluteal abscess with diffuse swelling and left hip effusion on CT scan. General surgery was consulted. Blood cultures positive for MSSA bacteremia. He underwent drainage of the abscess with 3 drain placement by IR in his iliopsoas muscle and multiple abscesses in the legs. On 09/15/2020 had left hip interposition arthroplasty with antibiotic spacer placement and debridement and developed decompensated septic shock with refractory hypotension despite 2 units PRBC, 2.2 L of crystalloid and 500 cc of albumin. He was admitted to ICU with vasopressor support. ID has been consulted and recommend 6 weeks of IV cefazolin.Patient also had scrotal edema anasarca in the setting of IV fluid resuscitation. Ultrasound was unremarkable for abscess but showed marked asymmetric left scrotal wall thickening and fluid with increased blood flow consistent with soft tissue infection.  Patient had multiple incision and drainage done by general surgery and also multiple aspiration of the abscesses with drain placement per IR.  He continues to need abscess drainage and wound VAC placement as per general surgery, Ortho and IR.  Because his abdomen was more distended and CT scan of the abdomen pelvis done last week showed moderate to large ascites we will consult GI.  I discussed with them about obtaining  a ultrasound of the abdomen and they will order also ordered a paracentesis.  Patient was given a dose of IV Lasix given his anasarca now.  GI has started the patient on a 2 g sodium diet and also started the patient on Lasix 40 minutes p.o. daily as well as Aldactone 100 g p.o. daily and follow his renal function carefully for the next few days.  Paracentesis been ordered and they are recommending full labs with cell count, cytology, protein, LDH and albumin and recommendations for weigh the patient daily.  Patient underwent his paracentesis on 09/30/2020 today drains started putting out less output interventional radiology is going to obtain a follow-up CT of the abdomen pelvis as well as a left femur. He had additional drains placed on 09/30/20 and Spiked a Temperature on 10/01/20 so Blood Cx were reordered.  We are continuing dressing changes per Ortho and general surgery recommendations and wound nurse has been consulted and they want to try and apply wound vacs with all 3 of the wounds to see if they can close out faster. Infectious diseases still recommending current regimen with cefazolin and metronidazole and rifampin.  Because of his distention GI has been reconsulted and are going to order another diagnostic and therapeutic paracentesis to confirm etiology and provide some relief of ascites.  They are recommending continue oral diuretics and restrict 2000 g sodium diet includes nutrition consult to maximize long-term compliance.  Recommending considering IV albumin followed by IV Lasix given his dramatic third spacing and they feel that he needs an EGD to screen for esophageal varices when he clinically stabilizes and recommend abstinence from all alcohol.  Assessment & Plan:   Principal Problem:   MSSA bacteremia Active Problems:   Alcohol dependence ([name])   Alcoholic hepatitis   Sepsis ([name])   Septic arthritis ([name])   Iliopsoas abscess on left ([name])  Iliopsoas and left gluteal abscesses  associated with MSSA Bacteremia and Groin Abscess Septic shock secondary to above and left hip septic arthritis and MSSA bacteremia Left hip effusion and septic arthritis in the setting of avascular necrosis -Admitted to Inpatient Telemetry  -Initially started on IV vanc, cefepime; pharmacy to dose but changed to IV Cefazolin by ID and now on IV Cefazolin and IV Metronidazole -Repeat Blood Cx x2 and obtain ECHO; See below -Initial Blood Cx 09/13/20 showed Staph Aureus in Both sets of Cx's; Repeated Blood Cx 10/01/20 -Sepsis Criteria met with: tachycardia, tachypnea, lactic acid 3.3, and Leukocytosis was 10.6 source: abscesses as above as well as bacteremia and went into septic shock during this admission -WBC was trending up and trended up to 17.3 and is now 14.4 and further improved to 12.2 but is now trending back up and went to 12.8 and today is now 13.1 -General surgery, interventional radiology, orthopedic surgery, infectious disease all consulted and appreciate their assistance -He has now 3 drains in place -Patient underwent a transesophageal echocardiogram that did not mention any vegetation -Repeat blood cultures have been done and show NGTD at 5 Days; since if TTE is negative they can recommend a transesophageal echocardiogram then but since his TTE was a difficult Study, ID Dr. Juleen Barker recommending TEE given Disseminated MSSA infection so he underwent a transesophageal echocardiogram on Monday, 09/19/2020. -Because of this orthopedic surgery reviewed the prior CT scan and they felt the best course of action is to take the patient for an open irrigation debridement of the medial and lateral thigh to express these fluid collections he went back to the OR on 09/21/2020 as well as 09/27/2020 neurosurgery was also consulted given that infection had spread into the left groin -IR recommending Repeating CT Abd/Pelvis and Femur 09/30/20 because WBC is also going up and was done yesterday persistent  undrained left cardiac cath and left anterolateral intramuscular fluid collections with peripheral enhancement concerning for abscesses that were amenable to CT-guided aspiration: CT-guided aspiration and drainage placement was done -CT scan also showed resolution of the psoas abscess drain after placement and there is persistent small peripheral enhancing fluid collections around the indwelling anterior thigh drains -a CT scan of the abdomen and pelvis also did note the patient had interval development of groundglass and consolidative opacities in the mid lungs bilaterally and a CT of the chest was dedicated to this and done and showed patchy bilateral upper lobe predominant airspace disease consistent with multifocal pneumonia -Conntinue with antibiotics for now but patient did spike a temperature yesterday afternoon on 10/01/2020 so we will repeat his blood cultures and reconsult ID for further evaluation -LE Venous Duplex repeated and showed no DVT but portions were limited  -General surgery is recommending continue antibiotics as well as dressing changes twice daily -Continue PT OT evaluation and continue dressing changes per orthopedic surgery and VT prophylaxis further recommendations but currently this is held given his anemia and thrombocytopenia -PT/OT recommending CIR still but once he is only cleared from a medical perspective and once orthopedic surgery and General Surgery signed off the case and has no further debridement plans and once he is stable from that perspective; ID will need to make recommendations as well as GI   Chronic Pain -pain  is not controlled, will start MS Contin 15 mg p.o. twice daily, discontinue oxycodone.   -We will continue with Dilaudid 1 mg IV every 4 hours as needed for breakthrough pain  Symptomatic Anemia Macrocytic Anemia -Presented with Hgb/Hct of 6.2/18.9 -Patient is status post 8 units of PRBCs total  Patient is hemoglobin/hematocrit is relatively  stable at 8.1/25d.6 -> 7.8/24.6 -> 7.8/24.5 on the last 3 checks -FOBT negative -Anemia panel done and showed an iron level of 44, U IBC 131, TIBC 175, saturation ratios of 25% -check LDH and was 308, haptoglobin pending, iron studies showed an iron level of 44, U IBC 131, TIBC 175, saturation ratios of 25% -Continue to monitor for signs and symptoms of bleeding; currently no overt bleeding noted  -repeat CBC in a.m.  Hx of Alcoholic Hepatitis and alcohol abuse Hepatic Cirrhosis ascites with anasarca and volume overload with significant third spacing Hyperbilirubinemia improved -Was continiung Prednisolone but this was discontinued due to disseminated MSSA bacteremia. -LFTs have normalized and so has his T bili but he continues to have some moderate ascites to large ascites noted on his CT scan and is more distended today with his anasarca in the setting of his hepatic cirrhosis -Patient and family report he has been alcohol free for 6 weeks -Continue lactulose 10 g p.o. twice daily, thiamine, magnesium -abdomen appears more distended appear more fluid-filled today -Will have GI weigh in for addition of spironolactone and furosemide and defer to them to obtain a ultrasound of the abdomen to evaluate his ascites as patient may need a paracentesis and fluid studies -GI have asked IR to do a paracentesis and this was done on 09/30/2020 with removal of 3 L maximum and fluid was sent for analysis studies; fluid analysis studies were not consistent with SBP; may need to repeat paracentesis -GI is also added the patient on Lasix 40 mg p.o. daily and spironolactone 100 g p.o. daily and will need to continue monitor his renal function closely -May need to more diuresis given his anasarca and his given albumin the day before yesterday -INR was 1.4 -Continue monitor hepatic function daily and has bilirubin and his LFTs are normalized -Still remains extremely volume overloaded but thankfully has n and I  asked GI to weigh in again and there is no order other therapeutic and diagnostic paracentesis today and they are recommending possibly considering IV albumin with IV Lasix given his marked third spacing -appreciate further GI recommendations and will follow up on the new diagnostic therapeutic paracentesis ordered by GI  Hx of Tachycardia; Now in Sinus Tachycardia intermittently HTN  -Baseline heart rate is anywhere between 100-110 per family -We will change carvedilol 6.25 mg p.o. twice daily to Metoprolol tartrate 25 mg p.o. twice daily and will further increase to 50 mg p.o. twice daily with good control of his heart rates  GAD -Increase sertraline dose to 50 mg p.o. nightly yesterday  GERD -C/w Pantoprazole 40 mg p.o. twice daily and will continue for now   Hyponatremia -Initiated on IV fluid when he was in septic shock but now this to stop and he was given diuresis -Sodium is now relatively stable but dropped a little bit and is now 126 today -GI has now added 40 mg of p.o. Lasix as well as 100 mg spironolactone p.o. daily; will consider IV albumin and IV Lasix if he continues to drop his sodium -Continue to monitor and trend and repeat CMP in a.m.  Metabolic Acidosis -Mild and improved -Patient CO2  is now  24, anion gap is 8, chloride level is 94 -We have stopped his IV fluid hydration and he is now on diuretics as above -Continue to monitor and trend and repeat CMP in a.m.  Hypophosphatemia -Patient's phosphorus level is now stable at 4.0 -Continue to monitor and replete as necessary -Repeat phosphorus level in a.m.  Hypomagnesemia -Patient magnesium was 1.7 -Replete with IV mag sulfate 2 g again today  -Continue to monitor and replete as necessary -Repeat Mag Level in the AM   Scrotal Edema and Anasarca, persistent -Patient was positive fluid balance prior to him getting a paracentesis and now is getting diuresed and is now - 5.177 which still remains  significantly I am overload and anasarca. -In the setting of his hepatic cirrhosis and IV fluid resuscitation as well as his infection; now has a lot of ascites -Stopped IV fluids and recommend scrotal elevation and sling and obtain a scrotal ultrasound and Doppler to rule out any pathology -Initial scrotal U/S done and showed "No evidence of testicular mass or torsion. Marked asymmetric left scrotal wall thickening and fluid, with increased blood flow. This is likely due to soft tissue infection, and part of the much more widespread process in the left pelvis and thigh demonstrated on prior CT." -Foley catheter has been removed but now has been placed back in -Repeat ultrasound done yesterday was concerning for soft tissue infection but urology evaluated and appreciate Dr. Lynne Logan evaluation and feels that this appears to be related to the regional edema from his leg and inguinal infection as well as edema.  There is no abscess on recent ultrasound and no evidence of scrotal skin infection and they recommended continue to monitor observe and recommending continuing for a long is a difficult for the patient ambulate for using urinal -Continue with Extremity Elevation and he has p.o. Lasix and Aldactone added by gastroenterology will need to continue this  Thrombocytopenia  -Patient's platelet count has now normalized and is 223,000 now -Continue to monitor for signs and symptoms of bleeding; currently no overt bleeding noted -Repeat CBC in a.m.  DVT prophylaxis: SCDs Code Status: FULL CODE Family Communication: Discussed with his girlfriend at bedside today Disposition Plan: Pending further clinical improvement will go to CIR once he is cleared from a specialist point of view and stable  Status is: Inpatient  Remains inpatient appropriate because:Unsafe d/c plan, IV treatments appropriate due to intensity of illness or inability to take PO and Inpatient level of care appropriate due to  severity of illness   Dispo: The patient is from: Home              Anticipated d/c is to: CIR              Patient currently is not medically stable to d/c.   Difficult to place patient No  Consultants:   General Surgery  Urology  Orthopedic Surgery  Interventional Radiology  Infectious Diseases   Gastroenterology    Procedures:  TOTAL HIP ARTHROPLASTY WITH CEMENT INTERPOSITION SPACINGfor septic hip arthritis from septicemia in the setting of alcoholic AVN of the left hip  Drainage of the abscess with 3 drain placement by IR in iliopsoas muscle and multiple abscesses in the legs  PROCEDURE by Dr. Mardelle Matte on 09/27/20: Left thigh irrigation and debridement, excisional debridement, skin, subcutaneous tissue, muscle with partial closure  Procedure: Incision and drainage of left groin abscess and left thigh abscess done by Dr. Romana Juniper on 09/27/20  Paracentesis done  on 09/30/2020 performed yielding 3 liters (maximum ordered) of clear, light yellow fluid. No immediate complications.  A portion of the fluid was submitted to the lab for preordered studies.EBL none.   Antimicrobials:  Anti-infectives (From admission, onward)   Start     Dose/Rate Route Frequency Ordered Stop   10/01/20 2200  ceFAZolin (ANCEF) IVPB 2g/100 mL premix        2 g 200 mL/hr over 30 Minutes Intravenous Every 8 hours 10/01/20 1602     09/29/20 1700  rifampin (RIFADIN) capsule 300 mg        300 mg Oral Every 12 hours 09/29/20 1131     09/26/20 1300  rifampin (RIFADIN) 300 mg in sodium chloride 0.9 % 100 mL IVPB  Status:  Discontinued        300 mg 200 mL/hr over 30 Minutes Intravenous Every 12 hours 09/26/20 1156 09/29/20 1131   09/18/20 1400  metroNIDAZOLE (FLAGYL) tablet 500 mg        500 mg Oral Every 8 hours 09/18/20 0927     09/15/20 1715  tobramycin (NEBCIN) powder  Status:  Discontinued          As needed 09/15/20 1715 09/15/20 2043   09/15/20 1715  vancomycin (VANCOCIN) powder  Status:   Discontinued          As needed 09/15/20 1716 09/15/20 2043   09/14/20 0600  ceFAZolin (ANCEF) IVPB 2g/100 mL premix  Status:  Discontinued        2 g 200 mL/hr over 30 Minutes Intravenous Every 8 hours 09/14/20 0359 10/01/20 1602   09/13/20 2200  vancomycin (VANCOREADY) IVPB 1500 mg/300 mL  Status:  Discontinued        1,500 mg 150 mL/hr over 120 Minutes Intravenous Every 12 hours 09/13/20 1636 09/14/20 0359   09/13/20 1500  ceFEPIme (MAXIPIME) 2 g in sodium chloride 0.9 % 100 mL IVPB  Status:  Discontinued        2 g 200 mL/hr over 30 Minutes Intravenous Every 8 hours 09/13/20 1437 09/14/20 0359   09/13/20 1130  vancomycin (VANCOREADY) IVPB 1750 mg/350 mL        1,750 mg 175 mL/hr over 120 Minutes Intravenous  Once 09/13/20 1107 09/13/20 1414   09/13/20 1045  ceFAZolin (ANCEF) IVPB 1 g/50 mL premix        1 g 100 mL/hr over 30 Minutes Intravenous  Once 09/13/20 1031 09/13/20 1307       Subjective: Seen and examined at at bedside and feels about the same as yesterday.  Continues to have some pain.  No nausea or vomiting.  Denies any cough.  Abdominal distention is little bit more today.  No other concerns or complaints at this time and no family at bedside.  Objective: Vitals:   10/03/20 1058 10/03/20 1357 10/03/20 2053 10/04/20 0542  BP:  110/73 113/72 119/84  Pulse:  84 (!) 108 (!) 101  Resp:  18 18 18   Temp:  99.7 F (37.6 C) 99.1 F (37.3 C) 98.3 F (36.8 C)  TempSrc:  Oral Oral Oral  SpO2: 91% 90% 93% 92%  Weight:      Height:        Intake/Output Summary (Last 24 hours) at 10/04/2020 1440 Last data filed at 10/04/2020 1037 Gross per 24 hour  Intake 1588.9 ml  Output 2915 ml  Net -1326.1 ml   Filed Weights   09/21/20 0500 09/28/20 0545 10/01/20 0500  Weight: 93.9 kg 93.2 kg 92.4 kg  Examination: Physical Exam:  Constitutional: WN/WD overweight Caucasian male currently no acute distress appears calm but still appears a little uncomfortable Eyes: Lids and  conjunctivae normal, sclerae anicteric  ENMT: External Ears, Nose appear normal. Grossly normal hearing.  Neck: Appears normal, supple, no cervical masses, normal ROM, no appreciable thyromegaly; no JVD Respiratory: Diminished to auscultation bilaterally with coarse breath sounds and some rhonchi and slight.  But no appreciable wheezing, rales or crackles. Normal respiratory effort and patient is not tachypenic. No accessory muscle use.  Unlabored breathing and not wearing supplemental oxygen via nasal cannula Cardiovascular: Slightly tachycardic rate but regular rhythm, no murmurs / rubs / gallops.  1-2+ lower extremity pitting edema Abdomen: Soft, non-tender, distended secondary body habitus and ascites.  Bowel sounds positive.  GU: Deferred. Musculoskeletal: No clubbing / cyanosis of digits/nails.  Left leg has drained in the Skin: No rashes, lesions, ulcers on limited skin evaluation and abdominal wall rash is improved. No induration; Warm and dry.  Neurologic: CN 2-12 grossly intact with no focal deficits. Romberg sign cerebellar reflexes not assessed.  Psychiatric: Normal judgment and insight. Alert and oriented x 3.  Slightly depressed appearing mood and appropriate affect.   Data Reviewed: I have personally reviewed following labs and imaging studies  CBC: Recent Labs  Lab 09/30/20 0518 10/01/20 0537 10/02/20 0611 10/03/20 0422 10/04/20 0516  WBC 17.3* 14.4* 12.2* 12.8* 13.1*  NEUTROABS 12.2* 10.5* 8.7* 9.1* 8.9*  HGB 8.1* 7.5* 8.1* 7.8* 7.8*  HCT 25.3* 23.5* 25.6* 24.6* 24.5*  MCV 102.4* 101.7* 103.6* 102.1* 101.2*  PLT 202 201 199 201 710   Basic Metabolic Panel: Recent Labs  Lab 09/30/20 0518 10/01/20 0537 10/02/20 0611 10/03/20 0422 10/04/20 0516  NA 128* 128* 128* 127* 126*  K 4.0 3.7 3.7 3.8 3.9  CL 98 96* 96* 97* 94*  CO2 21* 24 22 23 24   GLUCOSE 86 91 97 97 91  BUN 16 9 12 13 14   CREATININE 0.35* 0.35* 0.36* <0.30* 0.32*  CALCIUM 7.7* 7.6* 7.7* 7.8* 7.8*   MG 1.6* 1.7 1.7 1.5* 1.7  PHOS 3.9 3.8 3.8 3.4 4.0   GFR: Estimated Creatinine Clearance: 145.9 mL/min (A) (by C-G formula based on SCr of 0.32 mg/dL (L)). Liver Function Tests: Recent Labs  Lab 09/30/20 0518 10/01/20 0537 10/02/20 0611 10/03/20 0422 10/04/20 0516  AST 30 26 30 30  33  ALT 8 7 8 7 7   ALKPHOS 181* 152* 170* 134* 153*  BILITOT 1.2 1.0 0.9 0.9 0.9  PROT 5.4* 5.2* 5.5* 4.8* 5.1*  ALBUMIN 1.9* 2.1* 2.1* 1.9* 1.9*   No results for input(s): LIPASE, AMYLASE in the last 168 hours. No results for input(s): AMMONIA in the last 168 hours. Coagulation Profile: Recent Labs  Lab 09/29/20 1704  INR 1.4*   Cardiac Enzymes: No results for input(s): CKTOTAL, CKMB, CKMBINDEX, TROPONINI in the last 168 hours. BNP (last 3 results) No results for input(s): PROBNP in the last 8760 hours. HbA1C: No results for input(s): HGBA1C in the last 72 hours. CBG: No results for input(s): GLUCAP in the last 168 hours. Lipid Profile: No results for input(s): CHOL, HDL, LDLCALC, TRIG, CHOLHDL, LDLDIRECT in the last 72 hours. Thyroid Function Tests: No results for input(s): TSH, T4TOTAL, FREET4, T3FREE, THYROIDAB in the last 72 hours. Anemia Panel: No results for input(s): VITAMINB12, FOLATE, FERRITIN, TIBC, IRON, RETICCTPCT in the last 72 hours. Sepsis Labs: No results for input(s): PROCALCITON, LATICACIDVEN in the last 168 hours.  Recent Results (from the past 240  hour(s))  Body fluid culture w Gram Stain     Status: None   Collection Time: 09/30/20 12:55 PM   Specimen: Peritoneal Washings; Body Fluid  Result Value Ref Range Status   Specimen Description   Final    PERITONEAL Performed at [facility] [address] [place], [place] [address]    Special Requests   Final    Normal Performed at [facility], [place] [address], [place], [place] [address]    Gram Stain NO WBC SEEN NO ORGANISMS SEEN   Final   Culture   Final    NO GROWTH 3  DAYS Performed at [facility], [place] [address], [place], [place] [address]    Report Status 10/04/2020 FINAL  Final  Aerobic/Anaerobic Culture (surgical/deep wound)     Status: None (Preliminary result)   Collection Time: 09/30/20  4:56 PM   Specimen: Abscess  Result Value Ref Range Status   Specimen Description   Final    ABSCESS THIGH Performed at [facility] [address] [place], [place] [address]    Special Requests   Final    NONE Performed at [facility], [place] [address], [place], [place] [address]    Gram Stain   Final    ABUNDANT WBC PRESENT,BOTH PMN AND MONONUCLEAR ABUNDANT GRAM POSITIVE COCCI IN PAIRS Performed at [facility], [place] [address], [place], [place] [address]    Culture   Final    RARE STAPHYLOCOCCUS AUREUS NO ANAEROBES ISOLATED; CULTURE IN PROGRESS FOR 5 DAYS    Report Status PENDING  Incomplete   Organism ID, Bacteria STAPHYLOCOCCUS AUREUS  Final      Susceptibility   Staphylococcus aureus - MIC*    CIPROFLOXACIN <=0.5 SENSITIVE Sensitive     ERYTHROMYCIN <=0.25 SENSITIVE Sensitive     GENTAMICIN <=0.5 SENSITIVE Sensitive     OXACILLIN <=0.25 SENSITIVE Sensitive     TETRACYCLINE <=1 SENSITIVE Sensitive     VANCOMYCIN 1 SENSITIVE Sensitive     TRIMETH/SULFA <=10 SENSITIVE Sensitive     CLINDAMYCIN <=0.25 SENSITIVE Sensitive     RIFAMPIN >=32 RESISTANT Resistant     Inducible Clindamycin NEGATIVE Sensitive     * RARE STAPHYLOCOCCUS AUREUS  Culture, blood (routine x 2)     Status: None (Preliminary result)   Collection Time: 10/01/20  2:29 PM   Specimen: BLOOD RIGHT HAND  Result Value Ref Range Status   Specimen Description   Final    BLOOD RIGHT HAND Performed at [facility] [address], [place], [place] [address]    Special Requests   Final    BOTTLES DRAWN AEROBIC ONLY Blood Culture adequate volume Performed at [facility] [address]  [place], [place] [address]    Culture   Final    NO GROWTH 3 DAYS Performed at [facility], [place] [address], [place], [place] [address]    Report Status PENDING  Incomplete  Culture, blood (routine x 2)     Status: None (Preliminary result)   Collection Time: 10/01/20  2:29 PM   Specimen: BLOOD RIGHT FOREARM  Result Value Ref Range Status   Specimen Description   Final    BLOOD RIGHT FOREARM Performed at [facility] [address] [place], [place] [address]    Special Requests   Final    BOTTLES DRAWN AEROBIC ONLY Blood Culture adequate volume Performed at [facility], [place] [address] [place], [place] [address]    Culture   Final    NO GROWTH 3 DAYS Performed at [facility], [place] [address] [place], [place] [address]    Report Status PENDING  Incomplete     RN Pressure Injury Documentation:     Estimated body mass index is 29.23 kg/m as calculated from the following:   Height as of this encounter: 5' 10"  (1.778 m).   Weight as of this encounter: 92.4 kg.  Malnutrition Type:  Nutrition Problem: Increased nutrient needs Etiology: chronic illness   Malnutrition Characteristics:  Signs/Symptoms: estimated needs   Nutrition Interventions:  Interventions: [place]   Radiology Studies: No results found. Scheduled Meds: . (feeding supplement) PROSource Plus  30 mL Oral BID BM  . Chlorhexidine Gluconate Cloth  6 each Topical Daily  . docusate sodium  100 mg Oral BID  . feeding supplement  1 Container Oral BID BM  . folic acid  1 mg Oral Daily  . furosemide  40 mg Oral Daily  . guaiFENesin  1,200 mg Oral BID  . hydrocortisone cream   Topical BID  . lactulose  10 g Oral BID  . mouth rinse  15 mL Mouth Rinse BID  . metoprolol tartrate  50 mg Oral BID  . metroNIDAZOLE  500 mg Oral Q8H  . morphine  15 mg Oral Q12H  . multivitamin with minerals  1 tablet Oral Daily  . nutrition supplement (JUVEN)  1  packet Oral BID BM  . pantoprazole  40 mg Oral BID  . rifampin  300 mg Oral Q12H  . senna  1 tablet Oral BID  . sertraline  50 mg Oral Daily  . sodium chloride flush  5 mL Intracatheter Q8H  . sodium chloride flush  5 mL Intracatheter Q8H  . spironolactone  100 mg Oral Daily  . thiamine  100 mg Oral Daily   Continuous Infusions: .  ceFAZolin (ANCEF) IV 2 g (10/04/20 1347)    LOS: 21 days    Kerney Elbe, DO [facility] PAGER is on Green Level  If 7PM-7AM, please contact night-coverage [email or web address]

## 2020-10-04 NOTE — Telephone Encounter (Signed)
Message sent to scheduling staff to be scheduled.

## 2020-10-04 NOTE — Progress Notes (Signed)
Occupational Therapy Treatment Patient Details Name: [name] MRN: [id number] DOB: 03/08/1986 Today's Date: 10/04/2020    History of present illness George BRUMMET is a 35 y.o. male with medical history significant of AVN of hip ( originally was scheduled for a THA this coming March2022), HTN, GERD. Recent admission for COVID , acute renal failure and alcoholic hepatitis (08/22/20- 08/29/20). He was ok for a few days after discharge, but he began to feel very tired, weak, left thigh and leg pain and progressed over the last 2 weeks. He reports that the left thigh had become increasingly swollen and tender to touch. He has had a blister rupture on the inner thigh in the groin area. These symptoms increased through 2 days ago when he was no longer ago to walk without great pain. 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 Left thigh I & D and wound vac placement 3/1. s/p I&D groin abcess by general surgery 09/23/19. Additional drain placed L thigh per IR 09/23/20.  On 09/27/2020 he underwent I&D of the left thigh again with partial closure and surgery did an I&D of the left groin abscess.  S/P paracentesis 09/30/20.  IR plans for pulling of hip/retroperitoneal drain, aspirating a small persistent fluid collection in the left iliacus, aspiration/drainage of proximal lateral thigh collection, continuing remaining drains on bulb suction.   OT comments  Patient progressing slowly but steadily and showed improved willingness to perform ADLs at EOB rather than in supine position, progressing sitting balance to fair with BUE support, to good without UE support during the several minutes of  EOB sitting, compared to previous session. Pt also agreed to work on his sit to stand transfers in preparation for functional bathroom mobility, and stood from very elevated EOB with Max As of 1 person.  Patient remains limited by pain, generalized weakness and impaired activity tolerance, along with deficits noted below. Pt continues to demonstrate good rehab potential and would benefit from continued skilled OT to increase safety and independence with ADLs and functional transfers to allow pt to return home safely and reduce caregiver burden and fall risk.   Follow Up Recommendations       Equipment Recommendations  None recommended by OT    Recommendations for Other Services Rehab consult    Precautions / Restrictions Precautions Precautions: Fall;Posterior Hip Precaution Comments: pre medicate for pain, multiple lines/drains Restrictions Weight Bearing Restrictions: No LLE Weight Bearing: Weight bearing as tolerated Other Position/Activity Restrictions: 3 JP drains in Lt anterior thigh and wound vac       Mobility Bed Mobility Overal bed mobility: Needs Assistance Bed Mobility: Supine to Sit;Sit to Supine     Supine to sit: Mod assist;HOB elevated Sit to supine: Max assist;+2 for physical assistance   General bed mobility comments: Pt attempted lateral scooting at EOB, and able to perform 2 good scoots with Mod As. Pt however became overfatigued and in too much pain to continue. Nursing called and assisted with Max As sit to supine. Pt able to reach for OHT with verbal cue and assist with posterior supine scoot with Max As of 2 people.    Transfers Overall transfer level: Needs assistance Equipment used: Rolling walker (2 wheeled) Transfers: Sit  to/from Stand Sit to Stand: From elevated surface;Min assist;Max assist         General transfer comment: Pt attempted one stand, cleared hips but unable to fully stand upright, before lowing back to EOB. 2nd stand with increased  elevation to bed with Max As of 1 person.    Balance Overall balance assessment: Needs assistance Sitting-balance support: Feet unsupported;Bilateral upper extremity supported Sitting balance-Leahy Scale: Fair Sitting balance - Comments: Pt initially bracing self a tEOB with BUEs. Pt began to relax into position after several minutes, and release bed with BUEs to perform grooming tasks, progressing to good sitting balance.     Standing balance-Leahy Scale: Poor Standing balance comment: Reliance on walker and external assist                           ADL either performed or assessed with clinical judgement   ADL Overall ADL's : Needs assistance/impaired Eating/Feeding: Independent   Grooming: Oral care;Wash/dry face;Wash/dry hands;Sitting;Set up Grooming Details (indicate cue type and reason): Pt sat EOB and performed all grooming with setup only.                 Toilet Transfer: Maximal Dentist Details (indicate cue type and reason): Pt stood from highly elevated EOB to RW with Max As of 1 person, and maximum apparent pt effort.  Pt unable to take any steps this visit and required being lowered back to EOB due to pain.   Toileting - Clothing Manipulation Details (indicate cue type and reason): Pt remains on Foley catheter.     Functional mobility during ADLs: Moderate assistance;Maximal assistance;Rolling walker       Vision   Vision Assessment?: No apparent visual deficits   Perception     Praxis      Cognition Arousal/Alertness: Awake/alert Behavior During Therapy: WFL for tasks assessed/performed Overall Cognitive Status: Within Functional Limits for tasks assessed                                          Exercises     Shoulder Instructions       General Comments      Pertinent Vitals/ Pain       Pain Assessment: Faces Pain Score: 5  Faces Pain Scale: Hurts worst Pain Location: Pt rated pain as 5/10 at  rest to LT hip/LE.  When mobilizing and standing, pain appeared a 10. Pain Descriptors / Indicators: Discomfort;Grimacing;Operative site guarding;Guarding Pain Intervention(s): Limited activity within patient's tolerance;Monitored during session;Premedicated before session;Repositioned;Other (comment) (Coordinated timing of therapy with pain medication.)  Home Living                                          Prior Functioning/Environment              Frequency  Min 2X/week        Progress Toward Goals  OT Goals(current goals can now be found in the care plan section)  Progress towards OT goals: Progressing toward goals  Acute Rehab OT Goals Patient Stated Goal: to be able to walk again and more independent OT Goal Formulation: With patient Time For Goal Achievement: 10/18/20 (Dates of goals updated to reflect ongoing needs.) Potential to Achieve Goals: Good  Plan Discharge plan remains appropriate    Co-evaluation                 AM-PAC OT "6 Clicks" Daily Activity     Outcome Measure   Help from another person eating meals?: None Help from another person taking care of personal grooming?: A Little Help from another person toileting, which includes using toliet, bedpan, or urinal?: Total Help from another person bathing (including washing, rinsing, drying)?: A Lot Help from another person to put on and taking off regular upper body clothing?: A Little Help from another person to put on and taking off regular lower body clothing?: Total 6 Click Score: 14    End of Session Equipment Utilized During Treatment: Gait belt;Rolling walker  OT Visit Diagnosis: Other abnormalities of gait and mobility (R26.89);Muscle weakness (generalized) (M62.81);Pain Pain - Right/Left: Left Pain - part of body: Hip   Activity Tolerance Patient limited by pain;Patient limited by fatigue   Patient Left in bed;with call bell/phone within reach;with family/visitor  present;with bed alarm set   Nurse Communication Mobility status        Time: 6834-1962 OT Time Calculation (min): 38 min  Charges: OT General Charges $OT Visit: 1 Visit OT Treatments $Self Care/Home Management : 8-22 mins $Therapeutic Activity: 23-37 mins  Victorino Dike, OT Acute Rehab Services Office: [phone] 10/04/2020   Theodoro Clock 10/04/2020, 1:09 PM

## 2020-10-04 NOTE — Progress Notes (Signed)
Progress Note   Subjective  Chief Complaint: Cirrhosis and ascites  Today, the patient explains that he is "doing", nothing has really changed that much, though he is aware of plans for him to hopefully out of the hospital soon.  Tells me that fluid has been building back up in his belly again, this is not exactly "uncomfortable", but does feel tighter than before and he can tell that there is excess fluid in there.   Objective   Vital signs in last 24 hours: Temp:  [98.3 F (36.8 C)-99.7 F (37.6 C)] 98.3 F (36.8 C) (03/15 0542) Pulse Rate:  [84-108] 101 (03/15 0542) Resp:  [18] 18 (03/15 0542) BP: (110-119)/(72-84) 119/84 (03/15 0542) SpO2:  [90 %-93 %] 92 % (03/15 0542) Last BM Date: 10/03/20 General:    white male in NAD Heart:  Regular rate and rhythm; no murmurs Lungs: Respirations even and unlabored, lungs CTA bilaterally Abdomen:  Soft, nontender and moderate distension. Normal bowel sounds. Extremities:  Without edema. Neurologic:  Alert and oriented,  grossly normal neurologically. Psych:  Cooperative. Normal mood and affect.  Intake/Output from previous day: 03/14 0701 - 03/15 0700 In: 1673.8 [P.O.:1300; I.V.:10; IV Piggyback:363.8] Out: 2830 [Urine:2750; Drains:80] Intake/Output this shift: Total I/O In: 240 [P.O.:240] Out: 800 [Urine:800]  Lab Results: Recent Labs    10/02/20 0611 10/03/20 0422 10/04/20 0516  WBC 12.2* 12.8* 13.1*  HGB 8.1* 7.8* 7.8*  HCT 25.6* 24.6* 24.5*  PLT 199 201 223   BMET Recent Labs    10/02/20 0611 10/03/20 0422 10/04/20 0516  NA 128* 127* 126*  K 3.7 3.8 3.9  CL 96* 97* 94*  CO2 _0 GLUCOSE 97 97 91  BUN _1 CREATININE 0.36* <0.30* 0.32*  CALCIUM 7.7* 7.8* 7.8*   LFT Recent Labs    10/04/20 0516  PROT 5.1*  ALBUMIN 1.9*  AST 33  ALT 7  ALKPHOS 153*  BILITOT 0.9    Assessment / Plan:   Assessment: 1.  Alcoholic cirrhosis: Was admitted 08/22/2020 with Covid and also diagnosed with severe  alcoholic hepatitis at that time, he met criteria for steroid therapy and was discharged on a course of prednisolone, readmitted 09/13/2020 significant fatigue and complaints of left lower extremity pain and thereafter diagnosed with MSSA bacteremia, found to have left iliopsoas and left gluteal abscesses as well as a left hip effusion, had left hip interposition arthroplasty on 09/15/2020 and subsequently developed decompensated septic shock and required vasopressor support, has undergone several drainage procedures since then, prednisone was stopped on readmission, 2 recent CTs both with cirrhotic appearing liver moderate rhabdomyolysis IVs, increasing abdominal girth with some peripheral edema 2.  Anemia: Thought multifactorial  Plan: 1.  We will order a repeat therapeutic paracentesis today 2.  Continue Lasix 40 mg and spironolactone 100 mg daily 3.  Patient will need to be arranged follow-up in our outpatient clinic in the next 3 to 4 weeks.  This will be to establish care for his cirrhosis, will likely need an EGD in the future as well as frequent follow-up.  We will set this up. 4.  Please await any further recommendations from Dr. Tarri Glenn later today  Thank you for kind consultation.   LOS: 21 days   Levin Erp  10/04/2020, 1:27 PM

## 2020-10-04 NOTE — Progress Notes (Signed)
Subjective:  Patient is frustrated with progress so far with PT, felt like he should have been able to ambulate more. Otherwise no complaints.   Objective:  PE: VITALS:   Vitals:   10/03/20 1357 10/03/20 2053 10/04/20 0542 10/04/20 1440  BP: 110/73 113/72 119/84 126/73  Pulse: 84 (!) 108 (!) 101 98  Resp: 18 18 18 18   Temp: 99.7 F (37.6 C) 99.1 F (37.3 C) 98.3 F (36.8 C) 98.6 F (37 C)  TempSrc: Oral Oral Oral Oral  SpO2: 90% 93% 92% 96%  Weight:      Height:       General: Laying down in bed, in no acute distress MSK: left hip incision, still draining serous fluid but otherwise still well opposed. Left lateral thigh edematous, not erythematous. Left leg incision with wound vac intact and approx 750 cc's of serous fluid in canister. Left groin with packing and ABD's, C/D/I.  Non tender left calf. Sensation intact to all aspects of left foot. + edema of left foot+ DP pulse.Dorsiflexion/Plantar flexion  LABS  Results for orders placed or performed during the hospital encounter of 09/13/20 (from the past 24 hour(s))  CBC with Differential/Platelet     Status: Abnormal   Collection Time: 10/04/20  5:16 AM  Result Value Ref Range   WBC 13.1 (H) 4.0 - 10.5 K/uL   RBC 2.42 (L) 4.22 - 5.81 MIL/uL   Hemoglobin 7.8 (L) 13.0 - 17.0 g/dL   HCT 10/06/20 (L) 40.9 - 81.1 %   MCV 101.2 (H) 80.0 - 100.0 fL   MCH 32.2 26.0 - 34.0 pg   MCHC 31.8 30.0 - 36.0 g/dL   RDW 91.4 (H) 78.2 - 95.6 %   Platelets 223 150 - 400 K/uL   nRBC 0.0 0.0 - 0.2 %   Neutrophils Relative % 68 %   Neutro Abs 8.9 (H) 1.7 - 7.7 K/uL   Lymphocytes Relative 20 %   Lymphs Abs 2.6 0.7 - 4.0 K/uL   Monocytes Relative 8 %   Monocytes Absolute 1.0 0.1 - 1.0 K/uL   Eosinophils Relative 2 %   Eosinophils Absolute 0.2 0.0 - 0.5 K/uL   Basophils Relative 0 %   Basophils Absolute 0.0 0.0 - 0.1 K/uL   Immature Granulocytes 2 %   Abs Immature Granulocytes 0.27 (H) 0.00 - 0.07 K/uL  Comprehensive metabolic panel      Status: Abnormal   Collection Time: 10/04/20  5:16 AM  Result Value Ref Range   Sodium 126 (L) 135 - 145 mmol/L   Potassium 3.9 3.5 - 5.1 mmol/L   Chloride 94 (L) 98 - 111 mmol/L   CO2 24 22 - 32 mmol/L   Glucose, Bld 91 70 - 99 mg/dL   BUN 14 6 - 20 mg/dL   Creatinine, Ser 10/06/20 (L) 0.61 - 1.24 mg/dL   Calcium 7.8 (L) 8.9 - 10.3 mg/dL   Total Protein 5.1 (L) 6.5 - 8.1 g/dL   Albumin 1.9 (L) 3.5 - 5.0 g/dL   AST 33 15 - 41 U/L   ALT 7 0 - 44 U/L   Alkaline Phosphatase 153 (H) 38 - 126 U/L   Total Bilirubin 0.9 0.3 - 1.2 mg/dL   GFR, Estimated 0.86 >57 mL/min   Anion gap 8 5 - 15  Magnesium     Status: None   Collection Time: 10/04/20  5:16 AM  Result Value Ref Range   Magnesium 1.7 1.7 - 2.4 mg/dL  Phosphorus  Status: None   Collection Time: 10/04/20  5:16 AM  Result Value Ref Range   Phosphorus 4.0 2.5 - 4.6 mg/dL    No results found.  Assessment/Plan: Lefthipseptic arthritis/AVN,multiple left thigh and retroperitonealabscesses - 2/23 - placement of drain into left retroperitoneal abscess and two into left thigh by IR - 2/24 -left hip total arthroplasty with cement interposition spacer - 3/1 - left thigh irrigation and debridement with wound vac placementby orthopedics, removal of left retroperitoneal drain - 3/3 - I&D of left groin abscess with change of wound vac by general surgery 3/4 - CT guided drainage of left thigh fluid collection by IR - 3/8 - left thigh repeat irrigation and debridement by orthopedics with partial closure of wound, incision and drainage of left thigh abscess/left groin abscessby general surgery 3/11 - Removal of left psoas drain, CT guided aspiration of left iliacus abscess, CT guided drain placement of left anterior thigh abscess by IR - WBC trending up 13.1 today, will keep an eye on this if continues to trend up throughout the week, may need further imaging  - WBATLLE, continue OT and PT as able -defer to primary  forpharmacologic DVT prophylaxis due to coagulopathy,continueSCD's  - continue BID dressing changes to left hip woundwith gauze and ABD's - still drainage serous fluid, continue wound vac on left anterior thigh dressing   Contact information:   Weekdays 8-5 Janine Ores, PA-C [phone] A fter hours and holidays please check [email or web address] for group call information for [facility]  Armida Sans 10/04/2020, 6:30 PM

## 2020-10-04 NOTE — Progress Notes (Signed)
Physical Therapy Treatment Patient Details Name: [name] MRN: [id number] DOB: Sep 17, 1985 Today's Date: 10/04/2020    History of Present Illness [name] is a 35 y.o. male with medical history significant of AVN of hip ( originally was scheduled for a THA this coming March2022), HTN, GERD. Recent admission for COVID , acute renal failure and alcoholic hepatitis (08/22/20- 08/29/20). He was ok for a few days after discharge, but he began to feel very tired, weak, left thigh and leg pain and progressed over the last 2 weeks. He reports that the left thigh had become increasingly swollen and tender to touch. He has had a blister rupture on the inner thigh in the groin area. These symptoms increased through 2 days ago when he was no longer ago to walk without great pain. 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 Left thigh I & D and wound vac placement 3/1. s/p I&D groin abcess by general surgery 09/23/19. Additional drain placed L thigh per IR 09/23/20.  On 09/27/2020 he underwent I&D of the left thigh again with partial closure and surgery did an I&D of the left groin abscess.  S/P paracentesis 09/30/20.  IR plans for pulling of hip/retroperitoneal drain, aspirating a small persistent fluid collection in the left iliacus, aspiration/drainage of proximal lateral thigh collection, continuing remaining drains on bulb suction.    PT Comments    Pt stood with OT this morning EOB with much effort.  Performed AAROM TE's B LE.   Follow Up Recommendations  CIR     Equipment Recommendations  Rolling walker with 5" wheels;3in1 (PT)    Recommendations for Other Services Rehab  consult     Precautions / Restrictions Precautions Precautions: Fall;Posterior Hip Precaution Comments: pre medicate for pain, multiple lines/drains Restrictions Weight Bearing Restrictions: No LLE Weight Bearing: Weight bearing as tolerated Other Position/Activity Restrictions: 3 JP drains in Lt anterior thigh and wound vac    Mobility  Bed Mobility Ambulation/Gait                 Stairs             Wheelchair Mobility    Modified Rankin (Stroke Patients Only)       Balance Overall balance assessment: Needs assistance Sitting-balance support: Feet unsupported;Bilateral upper extremity supported Sitting balance-Leahy Scale: Fair Sitting balance - Comments: Pt initially bracing self a tEOB with BUEs. Pt began to relax into position after several minutes, and release bed with BUEs to perform grooming tasks, progressing to good sitting balance.     Standing balance-Leahy Scale: Poor Standing balance comment: Reliance on walker and external assist                            Cognition Arousal/Alertness: Awake/alert Behavior During Therapy: WFL for tasks assessed/performed Overall Cognitive Status: Within Functional Limits for tasks assessed                                 General Comments: AxO x 3      Exercises  15 reps AP 10 reps B LE knee presses 10 reps HS AROM  R LE/AAROM L LE 10 reps towel sqeezes 10 reps SLR AROM R LE/AAROM L LE    General Comments        Pertinent Vitals/Pain Pain Assessment: 0-10 Pain Score: 8  Faces Pain Scale: Hurts worst Pain Location: during TE's Pain Descriptors / Indicators: Discomfort;Grimacing;Operative site guarding;Guarding Pain Intervention(s): Monitored during session;Repositioned    Home Living                      Prior Function            PT Goals (current goals can now be found in the care plan section) Acute Rehab PT Goals Patient Stated Goal: to be able to walk  again and more independent Progress towards PT goals: Progressing toward goals    Frequency    Min 4X/week      PT Plan Current plan remains appropriate    Co-evaluation              AM-PAC PT "6 Clicks" Mobility   Outcome Measure  Help needed turning from your back to your side while in a flat bed without using bedrails?: A Lot Help needed moving from lying on your back to sitting on the side of a flat bed without using bedrails?: A Lot Help needed moving to and from a bed to a chair (including a wheelchair)?: A Lot Help needed standing up from a chair using your arms (e.g., wheelchair or bedside chair)?: A Lot Help needed to walk in hospital room?: Total Help needed climbing 3-5 steps with a railing? : Total 6 Click Score: 10    End of Session   Activity Tolerance: Patient limited by pain Patient left: in bed;with call bell/phone within reach;with family/visitor present;with nursing/sitter in room   PT Visit Diagnosis: Other abnormalities of gait and mobility (R26.89);Muscle weakness (generalized) (M62.81);Pain Pain - Right/Left: Left Pain - part of body: Hip     Time: 1340-1355 PT Time Calculation (min) (ACUTE ONLY): 15 min  Charges:  $Therapeutic Exercise: 8-22 mins                     Felecia Shelling  PTA Acute  Rehabilitation Services Pager      [phone] Office      [phone]

## 2020-10-04 NOTE — Telephone Encounter (Signed)
-----   Message from Unk Lightning, [name] sent at 10/04/2020  4:12 PM EDT ----- Regarding: follow up Can you get this patient follow-up with Mike Gip in clinic for his new diagnosis of alcoholic cirrhosis.  Please make this in 3-4 weeks.  Thanks, J LL

## 2020-10-05 ENCOUNTER — Inpatient Hospital Stay (HOSPITAL_COMMUNITY): Payer: 59

## 2020-10-05 DIAGNOSIS — T8452XA Infection and inflammatory reaction due to internal left hip prosthesis, initial encounter: Secondary | ICD-10-CM

## 2020-10-05 DIAGNOSIS — B9561 Methicillin susceptible Staphylococcus aureus infection as the cause of diseases classified elsewhere: Secondary | ICD-10-CM | POA: Diagnosis not present

## 2020-10-05 DIAGNOSIS — F1029 Alcohol dependence with unspecified alcohol-induced disorder: Secondary | ICD-10-CM | POA: Diagnosis not present

## 2020-10-05 DIAGNOSIS — A419 Sepsis, unspecified organism: Secondary | ICD-10-CM | POA: Diagnosis not present

## 2020-10-05 DIAGNOSIS — M00052 Staphylococcal arthritis, left hip: Secondary | ICD-10-CM | POA: Diagnosis not present

## 2020-10-05 DIAGNOSIS — R7881 Bacteremia: Secondary | ICD-10-CM | POA: Diagnosis not present

## 2020-10-05 DIAGNOSIS — K7011 Alcoholic hepatitis with ascites: Secondary | ICD-10-CM | POA: Diagnosis not present

## 2020-10-05 DIAGNOSIS — K7031 Alcoholic cirrhosis of liver with ascites: Secondary | ICD-10-CM | POA: Diagnosis not present

## 2020-10-05 LAB — COMPREHENSIVE METABOLIC PANEL
ALT: 8 U/L (ref 0–44)
AST: 31 U/L (ref 15–41)
Albumin: 1.9 g/dL — ABNORMAL LOW (ref 3.5–5.0)
Alkaline Phosphatase: 148 U/L — ABNORMAL HIGH (ref 38–126)
Anion gap: 6 (ref 5–15)
BUN: 9 mg/dL (ref 6–20)
CO2: 22 mmol/L (ref 22–32)
Calcium: 7.7 mg/dL — ABNORMAL LOW (ref 8.9–10.3)
Chloride: 96 mmol/L — ABNORMAL LOW (ref 98–111)
Creatinine, Ser: <0.3 mg/dL — ABNORMAL LOW (ref 0.61–1.24)
Glucose, Bld: 91 mg/dL (ref 70–99)
Potassium: 3.7 mmol/L (ref 3.5–5.1)
Sodium: 124 mmol/L — ABNORMAL LOW (ref 135–145)
Total Bilirubin: 0.9 mg/dL (ref 0.3–1.2)
Total Protein: 5.1 g/dL — ABNORMAL LOW (ref 6.5–8.1)

## 2020-10-05 LAB — CBC WITH DIFFERENTIAL/PLATELET
Abs Immature Granulocytes: 0.27 10*3/uL — ABNORMAL HIGH (ref 0.00–0.07)
Basophils Absolute: 0 10*3/uL (ref 0.0–0.1)
Basophils Relative: 0 %
Eosinophils Absolute: 0.3 10*3/uL (ref 0.0–0.5)
Eosinophils Relative: 2 %
HCT: 24.1 % — ABNORMAL LOW (ref 39.0–52.0)
Hemoglobin: 7.7 g/dL — ABNORMAL LOW (ref 13.0–17.0)
Immature Granulocytes: 2 %
Lymphocytes Relative: 20 %
Lymphs Abs: 2.6 10*3/uL (ref 0.7–4.0)
MCH: 32.5 pg (ref 26.0–34.0)
MCHC: 32 g/dL (ref 30.0–36.0)
MCV: 101.7 fL — ABNORMAL HIGH (ref 80.0–100.0)
Monocytes Absolute: 1 10*3/uL (ref 0.1–1.0)
Monocytes Relative: 8 %
Neutro Abs: 8.9 10*3/uL — ABNORMAL HIGH (ref 1.7–7.7)
Neutrophils Relative %: 68 %
Platelets: 234 10*3/uL (ref 150–400)
RBC: 2.37 MIL/uL — ABNORMAL LOW (ref 4.22–5.81)
RDW: 18.1 % — ABNORMAL HIGH (ref 11.5–15.5)
WBC: 13.1 10*3/uL — ABNORMAL HIGH (ref 4.0–10.5)
nRBC: 0 % (ref 0.0–0.2)

## 2020-10-05 LAB — AEROBIC/ANAEROBIC CULTURE W GRAM STAIN (SURGICAL/DEEP WOUND)

## 2020-10-05 LAB — PHOSPHORUS: Phosphorus: 4.3 mg/dL (ref 2.5–4.6)

## 2020-10-05 LAB — MAGNESIUM: Magnesium: 1.6 mg/dL — ABNORMAL LOW (ref 1.7–2.4)

## 2020-10-05 MED ORDER — FUROSEMIDE 40 MG PO TABS
40.0000 mg | ORAL_TABLET | Freq: Two times a day (BID) | ORAL | Status: DC
  Administered 2020-10-05 – 2020-10-09 (×9): 40 mg via ORAL
  Filled 2020-10-05 (×9): qty 1

## 2020-10-05 MED ORDER — MORPHINE SULFATE 15 MG PO TABS
15.0000 mg | ORAL_TABLET | Freq: Four times a day (QID) | ORAL | Status: DC | PRN
  Administered 2020-10-06 – 2020-10-10 (×5): 15 mg via ORAL
  Filled 2020-10-05 (×5): qty 1

## 2020-10-05 MED ORDER — LIDOCAINE HCL 1 % IJ SOLN
INTRAMUSCULAR | Status: AC
  Filled 2020-10-05: qty 20

## 2020-10-05 NOTE — Consult Note (Addendum)
WOC Nurse Consult Note: Surgical team following for assessment and plan of care for post-op wounds Vac dressing change to left thigh Wound type: Full thickness post-op wound; beefy red and moist, large amt pink drainage in the cannister. Refer to previous consult note for measurements,  wound has significantly decreased in depth since previous assessment.  Measurement: Left thigh with 1 piece black foam applied after pt was medicated for pain. Tolerated with mod amt discomfort. Cont suction on at .  Sutures intact to lower wound.  WOC team will plan dressing change Q M/W/F Cammie Mcgee MSN, RN, CWOCN, [place], CNS [phone]

## 2020-10-05 NOTE — Progress Notes (Signed)
Subjective:  No complaints this morning, states he just received pain medication and he has no pain. No other complaints. Patient is aware of paracentesis later today.   Objective:  PE: VITALS:   Vitals:   10/04/20 1440 10/04/20 2033 10/05/20 0500 10/05/20 0618  BP: 126/73 117/70  107/68  Pulse: 98 (!) 110  100  Resp: 18 18  18   Temp: 98.6 F (37 C) 99.5 F (37.5 C)  98 F (36.7 C)  TempSrc: Oral Oral  Oral  SpO2: 96% 92%  91%  Weight:   97 kg   Height:       General: Laying down in bed, in no acute distress MSK: left hip incision, still draining serous fluid but otherwise still well opposed. Left lateral thigh edematous, not erythematous. Left leg incision with wound vac intact and approx 800 cc's of serous fluid in canister. Left groin with packing and ABD's, C/D/I.  Non tender left calf. Sensation intact to all aspects of left foot. + edema of left foot+ DP pulse.Dorsiflexion/Plantar flexion intact.   LABS  Results for orders placed or performed during the hospital encounter of 09/13/20 (from the past 24 hour(s))  CBC with Differential/Platelet     Status: Abnormal   Collection Time: 10/05/20  4:46 AM  Result Value Ref Range   WBC 13.1 (H) 4.0 - 10.5 K/uL   RBC 2.37 (L) 4.22 - 5.81 MIL/uL   Hemoglobin 7.7 (L) 13.0 - 17.0 g/dL   HCT 10/07/20 (L) 33.5 - 45.6 %   MCV 101.7 (H) 80.0 - 100.0 fL   MCH 32.5 26.0 - 34.0 pg   MCHC 32.0 30.0 - 36.0 g/dL   RDW 25.6 (H) 38.9 - 37.3 %   Platelets 234 150 - 400 K/uL   nRBC 0.0 0.0 - 0.2 %   Neutrophils Relative % 68 %   Neutro Abs 8.9 (H) 1.7 - 7.7 K/uL   Lymphocytes Relative 20 %   Lymphs Abs 2.6 0.7 - 4.0 K/uL   Monocytes Relative 8 %   Monocytes Absolute 1.0 0.1 - 1.0 K/uL   Eosinophils Relative 2 %   Eosinophils Absolute 0.3 0.0 - 0.5 K/uL   Basophils Relative 0 %   Basophils Absolute 0.0 0.0 - 0.1 K/uL   Immature Granulocytes 2 %   Abs Immature Granulocytes 0.27 (H) 0.00 - 0.07 K/uL  Comprehensive metabolic panel      Status: Abnormal   Collection Time: 10/05/20  4:46 AM  Result Value Ref Range   Sodium 124 (L) 135 - 145 mmol/L   Potassium 3.7 3.5 - 5.1 mmol/L   Chloride 96 (L) 98 - 111 mmol/L   CO2 22 22 - 32 mmol/L   Glucose, Bld 91 70 - 99 mg/dL   BUN 9 6 - 20 mg/dL   Creatinine, Ser 10/07/20 (L) 0.61 - 1.24 mg/dL   Calcium 7.7 (L) 8.9 - 10.3 mg/dL   Total Protein 5.1 (L) 6.5 - 8.1 g/dL   Albumin 1.9 (L) 3.5 - 5.0 g/dL   AST 31 15 - 41 U/L   ALT 8 0 - 44 U/L   Alkaline Phosphatase 148 (H) 38 - 126 U/L   Total Bilirubin 0.9 0.3 - 1.2 mg/dL   GFR, Estimated NOT CALCULATED >60 mL/min   Anion gap 6 5 - 15  Phosphorus     Status: None   Collection Time: 10/05/20  4:46 AM  Result Value Ref Range   Phosphorus 4.3 2.5 - 4.6 mg/dL  Magnesium     Status: Abnormal   Collection Time: 10/05/20  4:46 AM  Result Value Ref Range   Magnesium 1.6 (L) 1.7 - 2.4 mg/dL    DG HIP UNILAT WITH PELVIS 2-3 VIEWS LEFT  Result Date: 10/04/2020 CLINICAL DATA:  Status post left hip replacement on September 15, 2020. EXAM: DG HIP (WITH OR WITHOUT PELVIS) 2-3V LEFT COMPARISON:  September 15, 2020 FINDINGS: There is no evidence of acute hip fracture or dislocation. A left hip replacement is seen without evidence of surrounding lucency to suggest the presence of hardware loosening or infection. There is no evidence of arthropathy or other focal bone abnormality. A solitary surgical drain is seen within soft tissues adjacent to the lateral aspect of the proximal left femoral shaft. Soft tissue swelling is seen along the lateral aspect of the left hip. Multiple radiopaque skin staples are also seen. IMPRESSION: 1. Status post left hip replacement without evidence of hardware loosening or infection. 2. Surgical drain positioning, as described above. Electronically Signed   By: Aram Candela M.D.   On: 10/04/2020 19:56    Assessment/Plan: Lefthipseptic arthritis/AVN,multiple left thigh and retroperitonealabscesses - 2/23 -  placement of drain into left retroperitoneal abscess and two into left thigh by IR - 2/24 -left hip total arthroplasty with cement interposition spacer - 3/1 - left thigh irrigation and debridement with wound vac placementby orthopedics, removal of left retroperitoneal drain - 3/3 - I&D of left groin abscess with change of wound vac by general surgery 3/4 - CT guided drainage of left thigh fluid collection by IR - 3/8 - left thigh repeat irrigation and debridement by orthopedics with partial closure of wound, incision and drainage of left thigh abscess/left groin abscessby general surgery 3/11 - Removal of left psoas drain, CT guided aspiration of left iliacus abscess, CT guided drain placement of left anterior thigh abscess by IR - WBC stable this morning - WBATLLE, continue OT and PT as able -defer to primary forpharmacologic DVT prophylaxis due to coagulopathy,continueSCD's  - continue BID dressing changes to left hip woundwith gauze and ABD's - still drainage serous fluid, continue wound vac on left anterior thigh dressing - patient schedule for paracentesis later today   Contact information:   Weekdays 8-5 Janine Ores, PA-C [phone] A fter hours and holidays please check [email or web address] for group call information for [facility]  Armida Sans 10/05/2020, 6:51 AM

## 2020-10-05 NOTE — Progress Notes (Signed)
Inpatient Rehab Admissions Coordinator:   Pt .remains unready for CIR. Per MD, pt. Will need to be cleared by ortho, ID, IR, General Surgry and GI before comng to  CIR.  Megan Salon, MS, CCC-SLP Rehab Admissions Coordinator  [phone] (celll) [phone] (office)

## 2020-10-05 NOTE — Progress Notes (Addendum)
PROGRESS NOTE    [name]      JOA:[id number] DOB: [birth year] DOA: 09/13/2020 PCP: [name], PA   Brief Narrative: [name] is a 35 y.o. male with a history of AVN of the hip, hypertension, GERD, alcoholic hepatitis. Patient presented secondary to left leg pain, found to have anemia and multiple leg/gluteal/iliospoas abscesses requiring multiple I&Ds. Complicated by septic shock and MSSA bacteremia in addition to cirrhosis with associated anasarca and ascites.   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 Transthoracic Echocardiogram and Transesophageal Echocardiogram obtained and were without evidence of vegetations. ID consulted with recommendations for Cafazolin/Flagyl/rifampin as mentioned below  Iliopsoas/left gluteal/groin abscesses Septic arthritis History of avascular necrosis Orthopedic surgery, IR and ID consulted. Patient underwent multiple I&Ds in addition to left total hip arthroplasty. Three drains in place including wound vac. Antibiotic coverage as mentioned above  -ID recommendations: Cefazolin, Flagyl and rifampin IV -IR recommendations for drain management -Orthopedic surgery recommendations for wound vac management  Septic shock Secondary to above. Lactic acid peak of 5.3. Managed with IV fluid resuscitation. Resolved.  Hypervolemic hyponatremia Secondary to fluid overload from fluid resuscitation from septic shock in addition to underlying cirrhosis/hypoalbuminemia and poor fluid balance. Nadir of 124. In/out measurements suggest net negative for admission, however patient's weight  is up 26 lbs. Weight pending today Sodium up to 127 today with goal likely between 127-130. -Continue to Lasix 40 mg BID for now -Daily BMP  Multifocal pneumonia Seen on CT scan on 3/11. No symptoms currently. On room air. Currently on antibiotic coverage for above infections.  Chronic pain -Continue MS contin BID  Symptomatic anemia Macrocytic anemia No active bleeding noted. Patient received a total of 6 units of PRBC. Hemoglobin currently stable.  Hepatic cirrhosis with ascites Anasarca History of alcoholic hepatitis GI consulted. Patient managed with intermittent paracenteses in addition to lasix and spironolactone. GI recommending EGD to screen for esophageal varices which can be done as an outpatient. Paracentesis last performed on 3/16  Scrotal edema Urology consulted. No concern for infection. In setting of anasarca as mentioned above but possibly with some reactive change from adjacent infections. No evidence of abscess or local skin infection. Improving. Urology recommending to continue foley until able to use urinal. -Continue urinary foley catheter  Thrombocytopenia Likely related to acute infection. He received a total of 1 unit of platelets in addition to 1 unit of FFP. Resolved.  Sinus tachycardia Outpatient problem. Patient followed by cardiology. Managed on metoprolol.  Primary hypertension Blood pressure well controlled -Continue metoprolol  GAD -Continue Zoloft  GERD -Continue Protonix  Metabolic acidosis Resolved  Hypophosphatemia Resolved.  Hypomagnesemia Replete as needed.   DVT prophylaxis: SCDs Code Status:   Code Status: Full Code Family Communication: None at bedside Disposition Plan: Discharge to CIR pending improvement of sodium, in addition to orthopedic surgery recommendations. Likely in 2-4 days   Consultants:   General surgery  Orthopedic surgery  Infectious  disease  Urology  Gastroenterology  Nephrology  Procedures:   Multiple I&D  Left hip total hip arthroplasty  Antimicrobials:  Cefazolin  Flagyl  Rifampin  Cefepime  Vancomycin   Subjective: No issues. Feels like his swelling continues to improve.  Objective:       Vitals:   10/04/20 1440 10/04/20 2033 10/05/20 0500 10/05/20 0618  BP: 126/73 117/70  107/68  Pulse: 98 (!) 110  100  Resp: 18 18  18   Temp: 98.6 F (37 C) 99.5 F (37.5 C)  98 F (36.7 C)  TempSrc: Oral Oral  Oral  SpO2: 96% 92%  91%  Weight:   97 kg   Height:        Intake/Output Summary (Last 24 hours) at 10/05/2020 0949 Last data filed at 10/05/2020 0910    Gross per 24 hour  Intake 1558.43 ml  Output 3825 ml  Net -2266.57 ml        Filed Weights   09/28/20 0545 10/01/20 0500 10/05/20 0500  Weight: 93.2 kg 92.4 kg 97 kg    Examination:  General exam: Appears calm and comfortable and in no acute distress. Conversant Respiratory: Clear to auscultation. Respiratory effort normal with no intercostal retractions or use of accessory muscles Cardiovascular: S1 & S2 heard, RRR. No murmurs, rubs, gallops or clicks. 2+ BLE edema Gastrointestinal: Abdomen is distended, soft and nontender. No masses felt. Normal bowel sounds heard Neurologic: No focal neurological deficits Musculoskeletal: No calf tenderness Skin: No cyanosis. No new rashes Psychiatry: Alert and oriented x3. Memory intact. Mood & affect appropriate    Data Reviewed: I have personally reviewed following labs and imaging studies  CBC Recent Labs       Lab Results  Component Value Date   WBC 13.1 (H) 10/05/2020   RBC 2.37 (L) 10/05/2020   HGB 7.7 (L) 10/05/2020   HCT 24.1 (L) 10/05/2020   MCV 101.7 (H) 10/05/2020   MCH 32.5 10/05/2020   PLT 234 10/05/2020   MCHC 32.0 10/05/2020   RDW 18.1 (H) 10/05/2020   LYMPHSABS 2.6 10/05/2020   MONOABS 1.0 10/05/2020   EOSABS 0.3  10/05/2020   BASOSABS 0.0 10/05/2020       Last metabolic panel Recent Labs       Lab Results  Component Value Date   NA 124 (L) 10/05/2020   K 3.7 10/05/2020   CL 96 (L) 10/05/2020   CO2 22 10/05/2020   BUN 9 10/05/2020   CREATININE <0.30 (L) 10/05/2020   GLUCOSE 91 10/05/2020   GFRNONAA NOT CALCULATED 10/05/2020   GFRAA >60 02/24/2019   CALCIUM 7.7 (L) 10/05/2020   PHOS 4.3 10/05/2020   PROT 5.1 (L) 10/05/2020   ALBUMIN 1.9 (L) 10/05/2020   BILITOT 0.9 10/05/2020   ALKPHOS 148 (H) 10/05/2020   AST 31 10/05/2020   ALT 8 10/05/2020   ANIONGAP 6 10/05/2020      CBG (last 3)  Recent Labs (last 2 labs)   No results for input(s): GLUCAP in the last 72 hours.     GFR: CrCl cannot be calculated (This lab value cannot be used to calculate CrCl because it is not a number: <0.30).  Coagulation Profile: Last Labs      Recent Labs  Lab 09/29/20 1704  INR 1.4*             Recent Results (from the past 240 hour(s))  Body fluid culture w Gram Stain     Status: None   Collection Time: 09/30/20 12:55 PM   Specimen: Peritoneal Washings; Body Fluid  Result Value Ref Range Status   Specimen Description   Final    PERITONEAL Performed at [facility], [address], [place], [place] [address]    Special Requests   Final    Normal Performed at [facility], [address] [place], [place] [address]    Gram Stain NO WBC SEEN NO ORGANISMS SEEN   Final   Culture   Final    NO GROWTH 3 DAYS Performed at [facility], [address], [place], [place] [address]    Report Status 10/04/2020 FINAL  Final  Aerobic/Anaerobic Culture (surgical/deep wound)     Status: None (Preliminary result)   Collection Time: 09/30/20  4:56 PM   Specimen: Abscess  Result Value Ref Range Status   Specimen Description   Final    ABSCESS THIGH Performed at [facility], [address] [place], [place] [address]    Special Requests   Final    NONE Performed at [facility], [address], [place], [place] [address]    Gram Stain   Final    ABUNDANT WBC PRESENT,BOTH PMN AND MONONUCLEAR ABUNDANT GRAM POSITIVE COCCI IN PAIRS Performed at [facility], [address], [place], [place] [address]    Culture   Final    RARE STAPHYLOCOCCUS AUREUS NO ANAEROBES ISOLATED; CULTURE IN PROGRESS FOR 5 DAYS    Report Status PENDING  Incomplete   Organism ID, Bacteria STAPHYLOCOCCUS AUREUS  Final      Susceptibility   Staphylococcus aureus - MIC*    CIPROFLOXACIN <=0.5 SENSITIVE Sensitive     ERYTHROMYCIN <=0.25 SENSITIVE Sensitive     GENTAMICIN <=0.5 SENSITIVE Sensitive     OXACILLIN <=0.25 SENSITIVE Sensitive     TETRACYCLINE <=1 SENSITIVE Sensitive     VANCOMYCIN 1 SENSITIVE Sensitive     TRIMETH/SULFA <=10 SENSITIVE Sensitive     CLINDAMYCIN <=0.25 SENSITIVE Sensitive     RIFAMPIN >=32 RESISTANT Resistant     Inducible Clindamycin NEGATIVE Sensitive     * RARE STAPHYLOCOCCUS AUREUS  Culture, blood (routine x 2)     Status: None (Preliminary result)   Collection Time: 10/01/20  2:29 PM   Specimen: BLOOD RIGHT HAND  Result Value Ref Range Status   Specimen Description   Final    BLOOD RIGHT HAND Performed at [facility], [address] [place], [place] [address]    Special Requests   Final    BOTTLES DRAWN AEROBIC ONLY Blood Culture adequate volume Performed at [facility], [address], [place], [place] [address]    Culture   Final    NO GROWTH 4 DAYS Performed at [facility], [address], [place], [place] [address]    Report Status PENDING  Incomplete  Culture, blood (routine x 2)     Status: None (Preliminary result)   Collection Time: 10/01/20  2:29 PM   Specimen: BLOOD RIGHT  FOREARM  Result Value Ref Range Status   Specimen Description   Final    BLOOD RIGHT FOREARM Performed at [facility], [address] [place], [place] [address]    Special Requests   Final    BOTTLES DRAWN AEROBIC ONLY Blood Culture adequate volume Performed at [facility], [address], [place], [place] [address]    Culture   Final    NO GROWTH 4 DAYS Performed at [facility], [address], [place], [place] [address]    Report Status PENDING  Incomplete  Radiology Studies:  Imaging Results (Last 48 hours)  DG HIP UNILAT WITH PELVIS 2-3 VIEWS LEFT  Result Date: 10/04/2020 CLINICAL DATA:  Status post left hip replacement on September 15, 2020. EXAM: DG HIP (WITH OR WITHOUT PELVIS) 2-3V LEFT COMPARISON:  September 15, 2020 FINDINGS: There is no evidence of acute hip fracture or dislocation. A left hip replacement is seen without evidence of surrounding lucency to suggest the presence of hardware loosening or infection. There is no evidence of arthropathy or other focal bone abnormality. A solitary surgical drain is seen within soft tissues adjacent to the lateral aspect of the proximal left femoral shaft. Soft tissue swelling is seen along the lateral aspect of the left hip. Multiple radiopaque skin staples are also seen. IMPRESSION: 1. Status post left hip replacement without evidence of hardware loosening or infection. 2. Surgical drain positioning, as described above. Electronically Signed   By: Aram Candelahaddeus  Houston M.D.   On: 10/04/2020 19:56         Scheduled Meds: . (feeding supplement) PROSource Plus  30 mL Oral BID BM  . Chlorhexidine Gluconate Cloth  6 each Topical Daily  . docusate sodium  100 mg Oral BID  . feeding supplement  1 Container Oral BID BM  . folic acid  1 mg Oral Daily  . furosemide  40 mg Oral Daily  . guaiFENesin  1,200 mg Oral BID  . hydrocortisone cream   Topical BID   . lactulose  10 g Oral BID  . mouth rinse  15 mL Mouth Rinse BID  . metoprolol tartrate  50 mg Oral BID  . metroNIDAZOLE  500 mg Oral Q8H  . morphine  15 mg Oral Q12H  . multivitamin with minerals  1 tablet Oral Daily  . nutrition supplement (JUVEN)  1 packet Oral BID BM  . pantoprazole  40 mg Oral BID  . rifampin  300 mg Oral Q12H  . senna  1 tablet Oral BID  . sertraline  50 mg Oral Daily  . sodium chloride flush  5 mL Intracatheter Q8H  . sodium chloride flush  5 mL Intracatheter Q8H  . spironolactone  100 mg Oral Daily  . thiamine  100 mg Oral Daily   Continuous Infusions: .  ceFAZolin (ANCEF) IV 2 g (10/05/20 0540)     LOS: 22 days     Jacquelin Hawkingalph Duval Macleod, MD [facility] 10/05/2020, 9:49 AM  If 7PM-7AM, please contact night-coverage [email or web address]

## 2020-10-05 NOTE — Progress Notes (Signed)
[facility]    Date of Admission:  09/13/2020      ID: [name] is a 35 y.o. 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 Left sided drains still having 10mL over last 24hr  Medications:  . (feeding supplement) PROSource Plus  30 mL Oral BID BM  . Chlorhexidine Gluconate Cloth  6 each Topical Daily  . docusate sodium  100 mg Oral BID  . feeding supplement  1 Container Oral BID BM  . folic acid  1 mg Oral Daily  . furosemide  40 mg Oral BID  . guaiFENesin  1,200 mg Oral BID  . hydrocortisone cream   Topical BID  . lactulose  10 g Oral BID  . lidocaine      . mouth rinse  15 mL Mouth Rinse BID  . metoprolol tartrate  50 mg Oral BID  . metroNIDAZOLE  500 mg Oral Q8H  . morphine  15 mg Oral Q12H  . multivitamin with minerals  1 tablet Oral Daily  . nutrition supplement (JUVEN)  1 packet Oral BID BM  . pantoprazole  40 mg Oral BID  . rifampin  300 mg Oral Q12H  . senna  1 tablet Oral BID  . sertraline  50 mg Oral Daily  . sodium chloride flush  5 mL Intracatheter Q8H  . sodium chloride flush  5 mL Intracatheter Q8H  . spironolactone  100 mg Oral Daily  . thiamine  100 mg Oral Daily    Objective: Vital signs in last 24 hours: Temp:  [98 F (36.7 C)-99.5 F (37.5 C)] 98 F (36.7 C) (03/16 0618) Pulse Rate:  [100-110] 100 (03/16 0618) Resp:  [18] 18 (03/16 0618) BP: (104-117)/(64-73) 104/70 (03/16 1332) SpO2:  [91 %-92 %] 91 % (03/16 0618) Weight:  [97 kg] 97 kg (03/16 0500)  Physical Exam  Constitutional: He is oriented to person, place, and time. He appears well-developed  and well-nourished. No distress.  HENT:  Mouth/Throat: Oropharynx is clear and moist. No oropharyngeal exudate.  Cardiovascular: Normal rate, regular rhythm and normal heart sounds. Exam reveals no gallop and no friction rub.  No murmur heard.  Pulmonary/Chest: Effort normal and breath sounds normal. No respiratory distress. He has no wheezes.  Abdominal: Soft. Bowel sounds are normal. He exhibits no distension. There is no tenderness.  gu =foley in place Ext:+1 pitting edema.drains in place Neurological: He is alert and oriented to person, place, and time.  Skin: Skin is warm and dry. No rash noted. No erythema.  Psychiatric: He has a normal mood and affect. His behavior is normal.     Lab Results Recent Labs    10/04/20 0516 10/05/20 0446  WBC 13.1* 13.1*  HGB 7.8* 7.7*  HCT 24.5* 24.1*  NA 126* 124*  K 3.9 3.7  CL 94* 96*  CO2 24 22  BUN 14 9  CREATININE 0.32* <0.30*   Liver Panel Recent Labs    10/04/20 0516 10/05/20 0446  PROT 5.1* 5.1*  ALBUMIN 1.9* 1.9*  AST 33 31  ALT 7 8  ALKPHOS 153* 148*  BILITOT 0.9 0.9  Sedimentation Rate No results for input(s): ESRSEDRATE in the last 72 hours. C-Reactive Protein No results for input(s): CRP in the last 72 hours.  Microbiology: reviewed Studies/Results: US Paracentesis  Result Date: 10/05/2020 INDICATION: Patient with history of septic arthritis, staph bacteremia, alcoholic cirrhosis, recurrent ascites. Request is made for therapeutic paracentesis. EXAM: ULTRASOUND GUIDED THERAPEUTIC PARACENTESIS MEDICATIONS: 10 mL 1% lidocaine COMPLICATIONS: None immediate. PROCEDURE: Informed written consent was obtained from the patient after a discussion of the risks, benefits and alternatives to treatment. A timeout was performed prior to the initiation of the procedure. Initial ultrasound scanning demonstrates a moderate amount of ascites within the right lower abdominal quadrant. The right lower abdomen was prepped and draped  in the usual sterile fashion. 1% lidocaine was used for local anesthesia. Following this, a 19 gauge, 7-cm, Yueh catheter was introduced. An ultrasound image was saved for documentation purposes. The paracentesis was performed. The catheter was removed and a dressing was applied. The patient tolerated the procedure well without immediate post procedural complication. FINDINGS: A total of approximately 3.5 liters of pale yellow fluid was removed. IMPRESSION: Successful ultrasound-guided therapeutic paracentesis yielding 3.5 liters of peritoneal fluid. Read by: Loyce Dys PA-C Electronically Signed   By: Gilmer Mor D.O.   On: 10/05/2020 13:36   DG HIP UNILAT WITH PELVIS 2-3 VIEWS LEFT  Result Date: 10/04/2020 CLINICAL DATA:  Status post left hip replacement on September 15, 2020. EXAM: DG HIP (WITH OR WITHOUT PELVIS) 2-3V LEFT COMPARISON:  September 15, 2020 FINDINGS: There is no evidence of acute hip fracture or dislocation. A left hip replacement is seen without evidence of surrounding lucency to suggest the presence of hardware loosening or infection. There is no evidence of arthropathy or other focal bone abnormality. A solitary surgical drain is seen within soft tissues adjacent to the lateral aspect of the proximal left femoral shaft. Soft tissue swelling is seen along the lateral aspect of the left hip. Multiple radiopaque skin staples are also seen. IMPRESSION: 1. Status post left hip replacement without evidence of hardware loosening or infection. 2. Surgical drain positioning, as described above. Electronically Signed   By: Aram Candela M.D.   On: 10/04/2020 19:56     Assessment/Plan: mssa septic arthritis of left hip s/p hw placement and psoas/quad abscess  S/p drain = continue on cefazolin plus rifampin plus metronidazole through April 8th. Currently tolerating using left arm piv for cefazolin. Other abtx are orals. Will check sed rate and crp at end of treatment course to decide if  further abtx needed. Still not ready to remove drains given 75-32mL output. Continues to have wound vac from initial I x D to left thigh.  [facility] Cell: [phone] Pager: [phone]  10/05/2020, 6:19 PM

## 2020-10-05 NOTE — Procedures (Signed)
PROCEDURE SUMMARY:  Successful US guided paracentesis from right lateral abdomen.  Yielded 3.5 liters of pale yellow fluid.  No immediate complications.  Pt tolerated well.   Specimen was not sent for labs.  EBL < 60mL  Hoyt Koch PA-C 10/05/2020 1:25 PM

## 2020-10-05 NOTE — Evaluation (Signed)
Occupational Therapy Evaluation Patient Details Name: [name] MRN: [id number] DOB: [birth year] Today's Date: 10/05/2020    History of Present Illness [name] is a 35 y.o. male with medical history significant of AVN of hip ( originally was scheduled for a THA this coming March2022), HTN, GERD. Recent admission for COVID , acute renal failure and alcoholic hepatitis (08/22/20- 08/29/20). He was ok for a few days after discharge, but he began to feel very tired, weak, left thigh and leg pain and progressed over the last 2 weeks. He reports that the left thigh had become increasingly swollen and tender to touch. He has had a blister rupture on the inner thigh in the groin area. These symptoms increased through 2 days ago when he was no longer ago to walk without great pain. 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 Left thigh I & D and wound vac placement 3/1. s/p I&D groin abcess by general surgery 09/23/19. Additional drain placed L thigh per IR 09/23/20.  On 09/27/2020 he underwent I&D of the left thigh again with partial closure and surgery did an I&D of the left groin abscess.  S/P paracentesis 09/30/20.  IR plans for pulling of hip/retroperitoneal drain, aspirating a small persistent fluid collection in the left iliacus, aspiration/drainage of proximal lateral thigh collection, continuing remaining drains on bulb suction.   Clinical Impression   Patient reports continued pain in left hip and leg and reports recent wound vac change. Patient mod assist to transfer to edge of bed needing assistance for LLE and pivoting of hips to edge of bed with use of bed pad. Patient  able to maintain balance at edge of bed while therapist managed lines and patient prepared for standing. Required significant elevated of bed for attempt to stand with walker but ultimately patient unable to power up with legs. Patient too close to edge of bed after attempting stand and required mod assist to scoot each hip back further on bed. Use of stedy, elevated bed height and mod assist x 2 to come into standing - for pericare and for second stand to transfer to recliner. Patient exhibited some difficulty with pushing trunk into extension with arms but eventually able to perform. Patient exhibits increased weakness of both upper and lower extremities today however transfer to recliner is an improvement in activity tolerance. Continue to recommend aggressive short term rehab.     Follow Up Recommendations  CIR    Equipment Recommendations  None recommended by OT    Recommendations for Other Services Rehab consult     Precautions / Restrictions Precautions Precautions: Fall;Posterior Hip Precaution Comments: pre medicate for pain, multiple lines/drains Restrictions Weight Bearing Restrictions: No LLE Weight Bearing: Weight bearing as tolerated Other Position/Activity Restrictions: 3 JP drains in Lt anterior thigh and wound vac      Mobility Bed Mobility Overal bed mobility: Needs Assistance Bed Mobility: Supine to Sit     Supine to sit: Mod assist;HOB elevated     General bed mobility comments: Mod assist for LLE and hip pivot with use of bed pad to transfer to side of bed.    Transfers Overall transfer level: Needs assistance Equipment used: Rolling walker (2 wheeled) Transfers: Sit to/from Stand  Sit to Stand: From elevated surface;Max assist         General transfer comment: Sit to stand from very elevated bed height with walker attempted. Patient unable to power up. Removed walker and use of sara stedy with second assistance - required mod assist x 2 from elevated bed  height.    Balance Overall balance assessment: Needs assistance Sitting-balance support: Bilateral upper extremity supported Sitting balance-Leahy Scale: Fair Sitting balance - Comments: Using arms to position him self more posteriorly however able to lift arms off of bed and maintain sitting balance   Standing balance support: Bilateral upper extremity supported;During functional activity Standing balance-Leahy Scale: Poor Standing balance comment: Reliance on walker and external assist                           ADL either performed or assessed with clinical judgement   ADL                         Lower Body Dressing Details (indicate cue type and reason): Total assist to don socks. Left edema improved and changed to smaller sock. R foot still edematous     Toileting- Clothing Manipulation and Hygiene: Total assistance;+2 for physical assistance;+2 for safety/equipment;Sit to/from stand Toileting - Clothing Manipulation Details (indicate cue type and reason): Total assist for pericare with use of steady and +2 assistance             Vision Patient Visual Report: No change from baseline       Perception     Praxis      Pertinent Vitals/Pain Pain Assessment: Faces Faces Pain Scale: Hurts whole lot Pain Location: LLE Pain Descriptors / Indicators: Discomfort;Grimacing;Operative site guarding;Guarding Pain Intervention(s): Limited activity within patient's tolerance;Premedicated before session;Monitored during session;Repositioned     Hand Dominance     Extremity/Trunk Assessment             Communication     Cognition Arousal/Alertness: Awake/alert Behavior During Therapy: WFL for tasks assessed/performed Overall Cognitive Status: Within Functional Limits for tasks assessed                                     General Comments       Exercises     Shoulder Instructions      Home Living                                           Prior Functioning/Environment                   OT Problem List: Decreased strength;Decreased range of motion;Decreased activity tolerance;Impaired balance (sitting and/or standing);Decreased knowledge of use of DME or AE;Decreased knowledge of precautions;Pain;Increased edema      OT Treatment/Interventions: Self-care/ADL training;Therapeutic exercise;DME and/or AE instruction;Therapeutic activities;Patient/family education    OT Goals(Current goals can be found in the care plan section) Acute Rehab OT Goals Patient Stated Goal: to be able to walk again and more independent OT Goal Formulation: With patient Time For Goal Achievement: 10/18/20 Potential to Achieve Goals: Good  OT Frequency: Min 2X/week   Barriers to D/C:            Co-evaluation       OT goals  addressed during session:  (functional mobility, actovity tolerance)      AM-PAC OT "6 Clicks" Daily Activity     Outcome Measure Help from another person eating meals?: None Help from another person taking care of personal grooming?: A Little Help from another person toileting, which includes using toliet, bedpan, or urinal?: Total Help from another person bathing (including washing, rinsing, drying)?: A Lot Help from another person to put on and taking off regular upper body clothing?: A Little Help from another person to put on and taking off regular lower body clothing?: Total 6 Click Score: 14   End of Session Equipment Utilized During Treatment: Gait belt;Rolling walker (stedy) Nurse Communication: Mobility status  Activity Tolerance: Patient limited by pain Patient left: in chair;with call bell/phone within reach;with family/visitor present;with nursing/sitter in room  OT Visit Diagnosis: Other abnormalities of gait and mobility (R26.89);Muscle weakness (generalized) (M62.81);Pain Pain - Right/Left: Left Pain - part of body: Hip;Leg                Time: 4709-6283 OT  Time Calculation (min): 34 min Charges:  OT General Charges $OT Visit: 1 Visit OT Treatments $Self Care/Home Management : 23-37 mins  Timi Reeser, OTR/L Acute Care Rehab Services  Office [phone] Pager: [phone]   Kelli Churn 10/05/2020, 1:14 PM

## 2020-10-06 ENCOUNTER — Encounter: Payer: Self-pay | Admitting: Physician Assistant

## 2020-10-06 LAB — CULTURE, BLOOD (ROUTINE X 2)
Culture: NO GROWTH
Culture: NO GROWTH
Special Requests: ADEQUATE
Special Requests: ADEQUATE

## 2020-10-06 LAB — COMPREHENSIVE METABOLIC PANEL
ALT: 6 U/L (ref 0–44)
AST: 49 U/L — ABNORMAL HIGH (ref 15–41)
Albumin: 1.9 g/dL — ABNORMAL LOW (ref 3.5–5.0)
Alkaline Phosphatase: 151 U/L — ABNORMAL HIGH (ref 38–126)
Anion gap: 9 (ref 5–15)
BUN: 9 mg/dL (ref 6–20)
CO2: 22 mmol/L (ref 22–32)
Calcium: 7.6 mg/dL — ABNORMAL LOW (ref 8.9–10.3)
Chloride: 96 mmol/L — ABNORMAL LOW (ref 98–111)
Creatinine, Ser: <0.3 mg/dL — ABNORMAL LOW (ref 0.61–1.24)
Glucose, Bld: 93 mg/dL (ref 70–99)
Potassium: 3.9 mmol/L (ref 3.5–5.1)
Sodium: 127 mmol/L — ABNORMAL LOW (ref 135–145)
Total Bilirubin: 1.3 mg/dL — ABNORMAL HIGH (ref 0.3–1.2)
Total Protein: 5.3 g/dL — ABNORMAL LOW (ref 6.5–8.1)

## 2020-10-06 MED ORDER — SODIUM CHLORIDE 0.9 % IV BOLUS
500.0000 mL | Freq: Once | INTRAVENOUS | Status: AC
  Administered 2020-10-06: 500 mL via INTRAVENOUS

## 2020-10-06 NOTE — Progress Notes (Addendum)
Referring Physician(s): Landau. J/ Drusilla Kanner  Supervising Physician: Malachy Moan  Patient Status:  [facility] - In-pt  Chief Complaint: Left hip and thigh drains for abscesses   Subjective: Patient stable.  Reports persistent pain but 'it's nothing like what it used to be.  Allergies: Patient has no active allergies.  Medications: Prior to Admission medications   Medication Sig Start Date End Date Taking? Authorizing Provider  carvedilol (COREG) 3.125 MG tablet Take 3.125 mg by mouth 2 (two) times daily. 07/28/20  Yes [provider]  folic acid (FOLVITE) 1 MG tablet Take 1 tablet (1 mg total) by mouth daily. 08/30/20  Yes Pokhrel, Laxman, MD  guaiFENesin-dextromethorphan (ROBITUSSIN DM) 100-10 MG/5ML syrup Take 5 mLs by mouth every 4 (four) hours as needed for cough (chest congestion). Patient taking differently: Take 10 mLs by mouth every 4 (four) hours as needed for cough (chest congestion). 08/29/20  Yes Pokhrel, Laxman, MD  lactulose (CHRONULAC) 10 GM/15ML solution Take 15 mLs (10 g total) by mouth 2 (two) times daily. 08/29/20  Yes Pokhrel, Laxman, MD  magnesium oxide (MAG-OX) 400 MG tablet Take 1 tablet by mouth daily. 07/28/20  Yes [provider]  pantoprazole (PROTONIX) 40 MG tablet Take 1 tablet (40 mg total) by mouth 2 (two) times daily before a meal for 28 days, THEN 1 tablet (40 mg total) daily for 28 days. 08/29/20 10/24/20 Yes Pokhrel, Laxman, MD  prednisoLONE (ORAPRED) 15 MG/5ML solution Take 1.6-13.3 mLs by mouth See admin instructions. Take 13.88ml by mouth daily for 28 days, 39ml for 4 days, 6.51ml daily for 4 days, 3.3 daily for 4 days, then 1.55ml daily for 3 days. 08/29/20  Yes [provider]  sertraline (ZOLOFT) 25 MG tablet Take 25 mg by mouth at bedtime. 08/20/20  Yes [provider]  thiamine 100 MG tablet Take 1 tablet (100 mg total) by mouth daily. 08/30/20  Yes Pokhrel, Laxman, MD  buPROPion (WELLBUTRIN XL) 300 MG 24 hr tablet Take 1  tablet (300 mg total) by mouth daily. Patient not taking: No sig reported 10/16/13   Rachael Fee, MD  carbamazepine (TEGRETOL XR) 200 MG 12 hr tablet Take 1 tablet (200 mg total) by mouth at bedtime. Patient not taking: No sig reported 10/16/13   Rachael Fee, MD  hydrOXYzine (ATARAX/VISTARIL) 25 MG tablet Take 1 tablet (25 mg total) by mouth every 6 (six) hours as needed for anxiety (or CIWA score </= 10). Patient not taking: No sig reported 10/16/13   Rachael Fee, MD  lisinopril-hydrochlorothiazide (ZESTORETIC) 20-25 MG tablet Take 1 tablet by mouth daily. Patient not taking: No sig reported 08/31/20   Pokhrel, Rebekah [place], MD  oxyCODONE (ROXICODONE) 5 MG immediate release tablet Take 1 tablet (5 mg total) by mouth every 8 (eight) hours as needed. Patient not taking: No sig reported 08/29/20 08/29/21  Pokhrel, Rebekah [place], MD  prednisoLONE 5 MG TABS tablet Take 8 tablets (40 mg total) by mouth daily for 28 days, THEN 6 tablets (30 mg total) daily for 4 days, THEN 4 tablets (20 mg total) daily for 4 days, THEN 2 tablets (10 mg total) daily for 4 days, THEN 1 tablet (5 mg total) every 3 (three) days for 1 day. Patient not taking: Reported on 09/13/2020 08/29/20 10/09/20  Joycelyn Das, MD  traMADol (ULTRAM) 50 MG tablet Take 1 tablet (50 mg total) by mouth every 6 (six) hours as needed. Patient not taking: Reported on 09/13/2020 08/29/20   Joycelyn Das, MD  Vital Signs: BP 104/74 (BP Location: Right Arm)   Pulse 100   Temp 98.3 F (36.8 C) (Oral)   Resp 18   Ht 5\' 10"  (1.778 m)   Wt 213 lb 13.5 oz (97 kg)   SpO2 91%   BMI 30.68 kg/m   Physical Exam Vitals reviewed.  Constitutional:      General: He is not in acute distress. Abdominal:     General: There is no distension.  Skin:    Comments: Positive for drains on left anterior thigh. Sites are unremarkable with no erythema, edema, tenderness, bleeding or drainage noted at exit site. Suture and stat lock in place. Dressing is clean dry and  intact. Trace of clear fluid noted in all three suction bulbs. All three drains flushe well.      Imaging: Paracentesis  Result Date: 10/05/2020 INDICATION: Patient with history of septic arthritis, staph bacteremia, alcoholic cirrhosis, recurrent ascites. Request is made for therapeutic paracentesis. EXAM: ULTRASOUND GUIDED THERAPEUTIC PARACENTESIS MEDICATIONS: 10 mL 1% lidocaine COMPLICATIONS: None immediate. PROCEDURE: Informed written consent was obtained from the patient after a discussion of the risks, benefits and alternatives to treatment. A timeout was performed prior to the initiation of the procedure. Initial ultrasound scanning demonstrates a moderate amount of ascites within the right lower abdominal quadrant. The right lower abdomen was prepped and draped in the usual sterile fashion. 1% lidocaine was used for local anesthesia. Following this, a 19 gauge, 7-cm, Yueh catheter was introduced. An ultrasound image was saved for documentation purposes. The paracentesis was performed. The catheter was removed and a dressing was applied. The patient tolerated the procedure well without immediate post procedural complication. FINDINGS: A total of approximately 3.5 liters of pale yellow fluid was removed. IMPRESSION: Successful ultrasound-guided therapeutic paracentesis yielding 3.5 liters of peritoneal fluid. Read by: 10/07/2020 PA-C Electronically Signed   By: Loyce Dys D.O.   On: 10/05/2020 13:36   DG HIP UNILAT WITH PELVIS 2-3 VIEWS LEFT  Result Date: 10/04/2020 CLINICAL DATA:  Status post left hip replacement on September 15, 2020. EXAM: DG HIP (WITH OR WITHOUT PELVIS) 2-3V LEFT COMPARISON:  September 15, 2020 FINDINGS: There is no evidence of acute hip fracture or dislocation. A left hip replacement is seen without evidence of surrounding lucency to suggest the presence of hardware loosening or infection. There is no evidence of arthropathy or other focal bone abnormality. A  solitary surgical drain is seen within soft tissues adjacent to the lateral aspect of the proximal left femoral shaft. Soft tissue swelling is seen along the lateral aspect of the left hip. Multiple radiopaque skin staples are also seen. IMPRESSION: 1. Status post left hip replacement without evidence of hardware loosening or infection. 2. Surgical drain positioning, as described above. Electronically Signed   By: September 17, 2020 M.D.   On: 10/04/2020 19:56    Labs:  CBC: Recent Labs    10/02/20 0611 10/03/20 0422 10/04/20 0516 10/05/20 0446  WBC 12.2* 12.8* 13.1* 13.1*  HGB 8.1* 7.8* 7.8* 7.7*  HCT 25.6* 24.6* 24.5* 24.1*  PLT 199 201 223 234    COAGS: Recent Labs    08/22/20 1817 08/25/20 0349 09/14/20 0525 09/15/20 2300 09/19/20 0305 09/29/20 1704  INR 1.4*   < > 1.5* 2.2* 1.5* 1.4*  APTT 42*  --   --   --   --   --    < > = values in this interval not displayed.    BMP: Recent Labs  10/03/20 0422 10/04/20 0516 10/05/20 0446 10/06/20 0927  NA 127* 126* 124* 127*  K 3.8 3.9 3.7 3.9  CL 97* 94* 96* 96*  CO2 23 24 22 22   GLUCOSE 97 91 91 93  BUN 13 14 9 9   CALCIUM 7.8* 7.8* 7.7* 7.6*  CREATININE <0.30* 0.32* <0.30* <0.30*  GFRNONAA NOT CALCULATED >60 NOT CALCULATED NOT CALCULATED    LIVER FUNCTION TESTS: Recent Labs    10/03/20 0422 10/04/20 0516 10/05/20 0446 10/06/20 0927  BILITOT 0.9 0.9 0.9 1.3*  AST 30 33 31 49*  ALT 7 7 8 6   ALKPHOS 134* 153* 148* 151*  PROT 4.8* 5.1* 5.1* 5.3*  ALBUMIN 1.9* 1.9* 1.9* 1.9*    Assessment and Plan: Pt with history of left hip septic arthritis/AVNcomplicated by development of left retroperitoneal and left thigh fluid collections concerning for abscesses.  Patient originally had left retroperitoneal, medial left thigh, lateral left thigh drain placement with IR on 09/14/20.   09/15/20: 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 .  09/20/20: Left thigh irrigation and debridement  with wound vac placement, Dr. 09/17/20.   09/22/20:incision and drainage of left groin abscess, wound VAC change, Dr. 11/20/20 C.   09/27/20: I&D of left groin and thigh abscess. Dr. 11/22/20 C.   09/30/20: Patient underwent removal of left retroperitoneal drain, aspiration of left iliacus abscess, and drain placement of left anterior thigh abscess with Dr. 11/27/20.  10/05/20: Patient underwent therapeutic paracentesis.   10/06/20: Patient stable, afebrile. No CBC done today, WBC elevated to 13.1 yesterday (WBC 12.8 on 3/15.) Remains tachycardic. Drain site clean, dry, no sign of infection. Trace clear OP in all suction bulbs. Overnight OP 20 mL each.   Continue with flushing TID, output recording q shift and dressing changes as needed. Would consider additional imaging when output is less than 10 ml for 24 hours not including flush material.     Electronically Signed: 10/07/20, PA-C 10/06/2020, 1:36 PM   I spent a total of 15 Minutes at the the patient's bedside AND on the patient's hospital floor or unit, greater than 50% of which was counseling/coordinating care for left thigh abscess drains.

## 2020-10-06 NOTE — Progress Notes (Signed)
PROGRESS NOTE    [name]  [id number] DOB: 01-Sep-1985 DOA: 09/13/2020 PCP: Maud DeedHowley, Desiree, PA   Brief Narrative: [name] is a 35 y.o. male with a history of AVN of the hip, hypertension, GERD, alcoholic hepatitis. Patient presented secondary to left leg pain, found to have anemia and multiple leg/gluteal/iliospoas abscesses requiring multiple I&Ds. Complicated by septic shock and MSSA bacteremia in addition to cirrhosis with associated anasarca and ascites.   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 Transthoracic Echocardiogram and Transesophageal Echocardiogram obtained and were without evidence of vegetations. ID consulted with recommendations for Cafazolin/Flagyl/rifampin as mentioned below  Iliopsoas/left gluteal/groin abscesses Septic arthritis History of avascular necrosis Orthopedic surgery, IR and ID consulted. Patient underwent multiple I&Ds in addition to left total hip arthroplasty. Three drains in place including wound vac. Antibiotic coverage as mentioned above  -ID recommendations: Cefazolin, Flagyl and rifampin IV -IR recommendations for drain management -Orthopedic surgery recommendations for wound vac management  Septic shock Secondary to above. Lactic acid peak of 5.3. Managed with IV fluid resuscitation. Resolved.  Hypervolemic hyponatremia Secondary to fluid overload from fluid resuscitation from septic shock in addition to underlying cirrhosis/hypoalbuminemia and poor fluid balance. Nadir of 124. In/out measurements suggest net negative for admission, however patient's weight is up 26 lbs.  Weight pending today Sodium up to 127 today with goal likely between 127-130. -Continue to Lasix 40 mg BID for now -Daily BMP  Multifocal pneumonia Seen on CT scan on 3/11. No symptoms currently. On room air. Currently on antibiotic coverage for above infections.  Chronic pain -Continue MS contin BID  Symptomatic anemia Macrocytic anemia No active bleeding noted. Patient received a total of 6 units of PRBC. Hemoglobin currently stable.  Hepatic cirrhosis with ascites Anasarca History of alcoholic hepatitis GI consulted. Patient managed with intermittent paracenteses in addition to lasix and spironolactone. GI recommending EGD to screen for esophageal varices which can be done as an outpatient. Paracentesis last performed on 3/16  Scrotal edema Urology consulted. No concern for infection. In setting of anasarca as mentioned above but possibly with some reactive change from adjacent infections. No evidence of abscess or local skin infection. Improving. Urology recommending to continue foley until able to use urinal. -Continue urinary foley catheter  Thrombocytopenia Likely related to acute infection. He received a total of 1 unit of platelets in addition to 1 unit of FFP. Resolved.  Sinus tachycardia Outpatient problem. Patient followed by cardiology. Managed on metoprolol.  Primary hypertension Blood pressure well controlled -Continue metoprolol  GAD -Continue Zoloft  GERD -Continue Protonix  Metabolic acidosis Resolved  Hypophosphatemia Resolved.  Hypomagnesemia Replete as needed.   DVT prophylaxis: SCDs Code Status:   Code Status: Full Code Family Communication: None at bedside Disposition Plan: Discharge to CIR pending improvement of sodium, in addition to orthopedic surgery recommendations. Likely in 2-4 days   Consultants:   General surgery  Orthopedic surgery  Infectious disease  Urology  Gastroenterology  Nephrology  Procedures:   Multiple  I&D  Left hip total hip arthroplasty  Antimicrobials:  Cefazolin  Flagyl  Rifampin  Cefepime  Vancomycin   Subjective:  No issues. Feels like his swelling continues to improve.  Objective: Vitals:   10/05/20 2045 10/06/20 0557 10/06/20 0933 10/06/20 1309  BP: 109/62 108/66 114/76 104/74  Pulse: (!) 110 99 (!) 107 100  Resp: 18 18 17 18   Temp: 98.2 F (36.8 C) 98.4 F (36.9 C) 98.1 F (36.7 C) 98.3 F (36.8 C)  TempSrc: Oral Oral Oral Oral  SpO2: 96% 92% 95% 91%  Weight:      Height:        Intake/Output Summary (Last 24 hours) at 10/06/2020 1659 Last data filed at 10/06/2020 1400 Gross per 24 hour  Intake 715 ml  Output 3895 ml  Net -3180 ml   Filed Weights   09/28/20 0545 10/01/20 0500 10/05/20 0500  Weight: 93.2 kg 92.4 kg 97 kg    Examination:  General exam: Appears calm and comfortable and in no acute distress. Conversant Respiratory: Clear to auscultation. Respiratory effort normal with no intercostal retractions or use of accessory muscles Cardiovascular: S1 & S2 heard, RRR. No murmurs, rubs, gallops or clicks. 2+ BLE edema Gastrointestinal: Abdomen is distended, soft and nontender. No masses felt. Normal bowel sounds heard Neurologic: No focal neurological deficits Musculoskeletal: No calf tenderness Skin: No cyanosis. No new rashes Psychiatry: Alert and oriented x3. Memory intact. Mood & affect appropriate    Data Reviewed: I have personally reviewed following labs and imaging studies  CBC Lab Results  Component Value Date   WBC 13.1 (H) 10/05/2020   RBC 2.37 (L) 10/05/2020   HGB 7.7 (L) 10/05/2020   HCT 24.1 (L) 10/05/2020   MCV 101.7 (H) 10/05/2020   MCH 32.5 10/05/2020   PLT 234 10/05/2020   MCHC 32.0 10/05/2020   RDW 18.1 (H) 10/05/2020   LYMPHSABS 2.6 10/05/2020   MONOABS 1.0 10/05/2020   EOSABS 0.3 10/05/2020   BASOSABS 0.0 10/05/2020     Last metabolic panel Lab Results  Component Value Date   NA 127 (L) 10/06/2020   K  3.9 10/06/2020   CL 96 (L) 10/06/2020   CO2 22 10/06/2020   BUN 9 10/06/2020   CREATININE <0.30 (L) 10/06/2020   GLUCOSE 93 10/06/2020   GFRNONAA NOT CALCULATED 10/06/2020   GFRAA >60 02/24/2019   CALCIUM 7.6 (L) 10/06/2020   PHOS 4.3 10/05/2020   PROT 5.3 (L) 10/06/2020   ALBUMIN 1.9 (L) 10/06/2020   BILITOT 1.3 (H) 10/06/2020   ALKPHOS 151 (H) 10/06/2020   AST 49 (H) 10/06/2020   ALT 6 10/06/2020   ANIONGAP 9 10/06/2020    CBG (last 3)  No results for input(s): GLUCAP in the last 72 hours.   GFR: CrCl cannot be calculated (This lab value cannot be used to calculate CrCl because it is not a number: <0.30).  Coagulation Profile: Recent Labs  Lab 09/29/20 1704  INR 1.4*    Recent Results (from the past 240 hour(s))  Body fluid culture w Gram Stain     Status: None   Collection Time: 09/30/20 12:55 PM   Specimen: Peritoneal Washings; Body Fluid  Result Value Ref Range Status   Specimen Description   Final    PERITONEAL Performed at [facility], [address] [place], [place]    Special Requests   Final    Normal Performed at [facility], [address], [place], [place]    Gram Stain NO WBC SEEN NO ORGANISMS SEEN   Final   Culture   Final    NO  GROWTH 3 DAYS Performed at [facility], [address], [place], [place] [address]    Report Status 10/04/2020 FINAL  Final  Aerobic/Anaerobic Culture (surgical/deep wound)     Status: None   Collection Time: 09/30/20  4:56 PM   Specimen: Abscess  Result Value Ref Range Status   Specimen Description   Final    ABSCESS THIGH Performed at [facility], [address] [place], [place] [address]    Special Requests   Final    NONE Performed at [facility], [address], [place], [place] [address]    Gram Stain   Final    ABUNDANT WBC PRESENT,BOTH PMN AND MONONUCLEAR ABUNDANT GRAM POSITIVE COCCI IN PAIRS     Culture   Final    RARE STAPHYLOCOCCUS AUREUS NO ANAEROBES ISOLATED Performed at [facility], [address], [place], [place] [address]    Report Status 10/05/2020 FINAL  Final   Organism ID, Bacteria STAPHYLOCOCCUS AUREUS  Final      Susceptibility   Staphylococcus aureus - MIC*    CIPROFLOXACIN <=0.5 SENSITIVE Sensitive     ERYTHROMYCIN <=0.25 SENSITIVE Sensitive     GENTAMICIN <=0.5 SENSITIVE Sensitive     OXACILLIN <=0.25 SENSITIVE Sensitive     TETRACYCLINE <=1 SENSITIVE Sensitive     VANCOMYCIN 1 SENSITIVE Sensitive     TRIMETH/SULFA <=10 SENSITIVE Sensitive     CLINDAMYCIN <=0.25 SENSITIVE Sensitive     RIFAMPIN >=32 RESISTANT Resistant     Inducible Clindamycin NEGATIVE Sensitive     * RARE STAPHYLOCOCCUS AUREUS  Culture, blood (routine x 2)     Status: None   Collection Time: 10/01/20  2:29 PM   Specimen: BLOOD RIGHT HAND  Result Value Ref Range Status   Specimen Description   Final    BLOOD RIGHT HAND Performed at [facility], [address] [place], [place] [address]    Special Requests   Final    BOTTLES DRAWN AEROBIC ONLY Blood Culture adequate volume Performed at [facility], [address], [place], [place] [address]    Culture   Final    NO GROWTH 5 DAYS Performed at [facility], [address] [place], [place] [address]    Report Status 10/06/2020 FINAL  Final  Culture, blood (routine x 2)     Status: None   Collection Time: 10/01/20  2:29 PM   Specimen: BLOOD RIGHT FOREARM  Result Value Ref Range Status   Specimen Description   Final    BLOOD RIGHT FOREARM Performed at [facility], [address], [place], [place] [address]    Special Requests   Final    BOTTLES DRAWN AEROBIC ONLY Blood Culture adequate volume Performed at [facility], [address], [place], [place] [address]    Culture   Final    NO GROWTH 5 DAYS Performed at [facility], [address] [place], [place] [address]    Report Status 10/06/2020 FINAL  Final        Radiology Studies: US Paracentesis  Result Date: 10/05/2020 INDICATION: Patient with history of septic arthritis, staph bacteremia, alcoholic cirrhosis, recurrent ascites. Request is made for therapeutic paracentesis. EXAM: ULTRASOUND GUIDED THERAPEUTIC PARACENTESIS MEDICATIONS: 10 mL 1% lidocaine COMPLICATIONS: None immediate. PROCEDURE: Informed written consent was obtained from the patient after a discussion of the risks, benefits and alternatives to treatment. A timeout was performed prior to  the initiation of the procedure. Initial ultrasound scanning demonstrates a moderate amount of ascites within the right lower abdominal quadrant. The right lower abdomen was prepped and draped in the usual sterile fashion. 1% lidocaine was used for local anesthesia. Following this, a 19 gauge, 7-cm, Yueh catheter was introduced. An ultrasound image was saved for documentation purposes. The paracentesis was performed. The catheter was removed and a dressing was applied. The patient tolerated the procedure well without immediate post procedural complication. FINDINGS: A total of approximately 3.5 liters of pale yellow fluid was removed. IMPRESSION: Successful ultrasound-guided therapeutic paracentesis yielding 3.5 liters of peritoneal fluid. Read by: Loyce Dys PA-C Electronically Signed   By: Gilmer Mor D.O.   On: 10/05/2020 13:36   DG HIP UNILAT WITH PELVIS 2-3 VIEWS LEFT  Result Date: 10/04/2020 CLINICAL DATA:  Status post left hip replacement on September 15, 2020. EXAM: DG HIP (WITH OR WITHOUT PELVIS) 2-3V LEFT COMPARISON:  September 15, 2020 FINDINGS: There is no evidence of acute hip fracture or dislocation. A left hip replacement is seen without evidence of surrounding lucency to suggest the presence of hardware loosening or infection. There is no evidence of arthropathy or other focal bone abnormality. A  solitary surgical drain is seen within soft tissues adjacent to the lateral aspect of the proximal left femoral shaft. Soft tissue swelling is seen along the lateral aspect of the left hip. Multiple radiopaque skin staples are also seen. IMPRESSION: 1. Status post left hip replacement without evidence of hardware loosening or infection. 2. Surgical drain positioning, as described above. Electronically Signed   By: Aram Candela M.D.   On: 10/04/2020 19:56        Scheduled Meds: . (feeding supplement) PROSource Plus  30 mL Oral BID BM  . Chlorhexidine Gluconate Cloth  6 each Topical Daily  . docusate sodium  100 mg Oral BID  . feeding supplement  1 Container Oral BID BM  . folic acid  1 mg Oral Daily  . furosemide  40 mg Oral BID  . guaiFENesin  1,200 mg Oral BID  . hydrocortisone cream   Topical BID  . lactulose  10 g Oral BID  . mouth rinse  15 mL Mouth Rinse BID  . metoprolol tartrate  50 mg Oral BID  . metroNIDAZOLE  500 mg Oral Q8H  . morphine  15 mg Oral Q12H  . multivitamin with minerals  1 tablet Oral Daily  . nutrition supplement (JUVEN)  1 packet Oral BID BM  . pantoprazole  40 mg Oral BID  . rifampin  300 mg Oral Q12H  . senna  1 tablet Oral BID  . sertraline  50 mg Oral Daily  . sodium chloride flush  5 mL Intracatheter Q8H  . sodium chloride flush  5 mL Intracatheter Q8H  . spironolactone  100 mg Oral Daily  . thiamine  100 mg Oral Daily   Continuous Infusions: .  ceFAZolin (ANCEF) IV 2 g (10/06/20 1445)     LOS: 23 days     Jacquelin Hawking, MD [facility] 10/06/2020, 4:59 PM  If 7PM-7AM, please contact night-coverage [email or web address]

## 2020-10-06 NOTE — Progress Notes (Signed)
Physical Therapy Treatment Patient Details Name: ICHAEL Barker MRN: [id number] DOB: 05-23-1986 Today's Date: 10/06/2020    History of Present Illness [name] is a 35 y.o. male with medical history significant of AVN of hip ( originally was scheduled for a THA this coming March2022), HTN, GERD. Recent admission for COVID , acute renal failure and alcoholic hepatitis (08/22/20- 08/29/20). He was ok for a few days after discharge, but he began to feel very tired, weak, left thigh and leg pain and progressed over the last 2 weeks. He reports that the left thigh had become increasingly swollen and tender to touch. He has had a blister rupture on the inner thigh in the groin area. These symptoms increased through 2 days ago when he was no longer ago to walk without great pain. 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 Left thigh I & D and wound vac placement 3/1. s/p I&D groin abcess by general surgery 09/23/19. Additional drain placed L thigh per IR 09/23/20.  On 09/27/2020 he underwent I&D of the left thigh again with partial closure and surgery did an I&D of the left groin abscess.  S/P paracentesis 09/30/20.  IR plans for pulling of hip/retroperitoneal drain, aspirating a small persistent fluid collection in the left iliacus, aspiration/drainage of proximal lateral thigh collection, continuing remaining drains on bulb suction.    PT Comments    L thigh looks visibly smaller.  Pt stated he was only up for exeresis this afternoon.  Performed B LE TE's listed below.    Follow Up Recommendations  CIR     Equipment Recommendations  Rolling walker with 5" wheels;3in1 (PT)     Recommendations for Other Services Rehab consult     Precautions / Restrictions Precautions Precautions: Fall;Posterior Hip Precaution Comments: pre medicate for pain, multiple lines/drains Restrictions Weight Bearing Restrictions: No LLE Weight Bearing: Weight bearing as tolerated Other Position/Activity Restrictions: 3 JP drains in Lt anterior thigh and wound vac       Balance                                            Cognition Arousal/Alertness: Awake/alert Behavior During Therapy: WFL for tasks assessed/performed Overall Cognitive Status: Within Functional Limits for tasks assessed                                 General Comments: AxO x 3      Exercises   15 reps B LE AP 10 reps B LE knee presses 10 reps B LE HS (AAROM L LE) avoid L hip flex > 90 10 reps B LA ABD/ADd( AAROM L LE)  10 reps B LE SAQ's 10 reps  B LA SLR (AAROM L LE)    General Comments        Pertinent Vitals/Pain Pain Assessment: Faces Faces Pain Scale: Hurts little more Pain Location: LLE with TE's Pain Descriptors / Indicators: Discomfort;Grimacing;Operative site guarding;Guarding Pain Intervention(s): Monitored during session;Premedicated before session;Repositioned    Home Living  Prior Function            PT Goals (current goals can now be found in the care plan section) Progress towards PT goals: Progressing toward goals    Frequency    Min 4X/week      PT Plan Current plan remains appropriate    Co-evaluation              AM-PAC PT "6 Clicks" Mobility   Outcome Measure  Help needed turning from your back to your side while in a flat bed without using bedrails?: A Lot Help needed moving from lying on your back to sitting on the side of a flat bed without using bedrails?: A Lot Help needed moving to and from a bed to a chair (including a wheelchair)?: A Lot Help needed standing up from a chair using your  arms (e.g., wheelchair or bedside chair)?: A Lot Help needed to walk in hospital room?: Total Help needed climbing 3-5 steps with a railing? : Total 6 Click Score: 10    End of Session   Activity Tolerance: Patient limited by pain Patient left: in bed;with call bell/phone within reach;with family/visitor present;with nursing/sitter in room   PT Visit Diagnosis: Other abnormalities of gait and mobility (R26.89);Muscle weakness (generalized) (M62.81);Pain Pain - Right/Left: Left Pain - part of body: Hip     Time: 1355-1420 PT Time Calculation (min) (ACUTE ONLY): 25 min  Charges:  $Therapeutic Exercise: 23-37 mins                     Felecia Shelling  PTA Acute  Rehabilitation Services Pager      [phone] Office      [phone]

## 2020-10-06 NOTE — Progress Notes (Signed)
Patient flagged a yellow mews due to elevated pulse of 120. Continues to rest in bed, no s/s of distress noted MD aware.

## 2020-10-06 NOTE — Progress Notes (Signed)
9 Days Post-Op    CC: Fatigue, left leg pain  Subjective: No new issues or complaints.  Objective: Vital signs in last 24 hours: Temp:  [98.1 F (36.7 C)-98.4 F (36.9 C)] 98.1 F (36.7 C) (03/17 0933) Pulse Rate:  [99-110] 107 (03/17 0933) Resp:  [17-18] 17 (03/17 0933) BP: (104-114)/(62-76) 114/76 (03/17 0933) SpO2:  [92 %-96 %] 95 % (03/17 0933) Last BM Date: 10/05/20  Intake/Output from previous day: 03/16 0701 - 03/17 0700 In: 1515 [P.O.:1200; I.V.:15; IV Piggyback:300] Out: 3910 [Urine:3250; Drains:660] Intake/Output this shift: Total I/O In: 120 [P.O.:120] Out: 310 [Urine:250; Drains:60]  PE: Skin: left groin wounds are clean with beefy red granulation tissue present.  Lab Results:  Recent Labs    10/04/20 0516 10/05/20 0446  WBC 13.1* 13.1*  HGB 7.8* 7.7*  HCT 24.5* 24.1*  PLT 223 234    BMET Recent Labs    10/05/20 0446 10/06/20 0927  NA 124* 127*  K 3.7 3.9  CL 96* 96*  CO2 22 22  GLUCOSE 91 93  BUN 9 9  CREATININE <0.30* <0.30*  CALCIUM 7.7* 7.6*   PT/INR No results for input(s): LABPROT, INR in the last 72 hours.  Recent Labs  Lab 10/02/20 0611 10/03/20 0422 10/04/20 0516 10/05/20 0446 10/06/20 0927  AST 30 30 33 31 49*  ALT 8 7 7 8 6   ALKPHOS 170* 134* 153* 148* 151*  BILITOT 0.9 0.9 0.9 0.9 1.3*  PROT 5.5* 4.8* 5.1* 5.1* 5.3*  ALBUMIN 2.1* 1.9* 1.9* 1.9* 1.9*     Lipase     Component Value Date/Time   LIPASE 99 (H) 08/23/2020 1500     Medications: . (feeding supplement) PROSource Plus  30 mL Oral BID BM  . Chlorhexidine Gluconate Cloth  6 each Topical Daily  . docusate sodium  100 mg Oral BID  . feeding supplement  1 Container Oral BID BM  . folic acid  1 mg Oral Daily  . furosemide  40 mg Oral BID  . guaiFENesin  1,200 mg Oral BID  . hydrocortisone cream   Topical BID  . lactulose  10 g Oral BID  . mouth rinse  15 mL Mouth Rinse BID  . metoprolol tartrate  50 mg Oral BID  . metroNIDAZOLE  500 mg Oral Q8H  .  morphine  15 mg Oral Q12H  . multivitamin with minerals  1 tablet Oral Daily  . nutrition supplement (JUVEN)  1 packet Oral BID BM  . pantoprazole  40 mg Oral BID  . rifampin  300 mg Oral Q12H  . senna  1 tablet Oral BID  . sertraline  50 mg Oral Daily  . sodium chloride flush  5 mL Intracatheter Q8H  . sodium chloride flush  5 mL Intracatheter Q8H  . spironolactone  100 mg Oral Daily  . thiamine  100 mg Oral Daily    Assessment/Plan Sinus tachycardia, chronic  Thrombocytopenia - resolved PMH alcoholic hepatitis Hepatic cirrhosis with ascites GAD GERD Anemia - hgb7.8  PMH AVN  Septic shock due to below on admission -now resolved s/p below procedures and IV abx MSSA bacteremia Septic arthritis of the left hip Iliopsoas and left proximal and medial thigh abscesses  09/14/20 Placement 14 F IR perc drain into left RP abscess, two 12 F drains placed in L thigh (Cx - staph) 09/15/20 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 09/17/20 09/20/20 Left thigh irrigation and debridement with wound vac placement, Dr. 11/20/20 Incision and drainage  of left groin abscess, wound VAC change left thigh 09/22/2020, Dr. Harriette Bouillon, POD #14 I&D of left groin abscess and thigh abscess 09/27/2020, Dr. Twana First, POD #9 - continue Abx per ID - wounds are clean.  Patient is clear from surgical standpoint for DC to CIR when otherwise stable from ortho/medicine standpoint. -will see again Monday if still here  FEN -regular diet VTE -none currently ID -flagyl, ancef    LOS: 23 days    Letha Cape 10/06/2020 Please see Amion

## 2020-10-07 ENCOUNTER — Encounter: Payer: Self-pay | Admitting: Physician Assistant

## 2020-10-07 DIAGNOSIS — K7030 Alcoholic cirrhosis of liver without ascites: Secondary | ICD-10-CM | POA: Diagnosis not present

## 2020-10-07 LAB — CBC
HCT: 27.1 % — ABNORMAL LOW (ref 39.0–52.0)
Hemoglobin: 8.6 g/dL — ABNORMAL LOW (ref 13.0–17.0)
MCH: 32.7 pg (ref 26.0–34.0)
MCHC: 31.7 g/dL (ref 30.0–36.0)
MCV: 103 fL — ABNORMAL HIGH (ref 80.0–100.0)
Platelets: 279 10*3/uL (ref 150–400)
RBC: 2.63 MIL/uL — ABNORMAL LOW (ref 4.22–5.81)
RDW: 18.4 % — ABNORMAL HIGH (ref 11.5–15.5)
WBC: 14 10*3/uL — ABNORMAL HIGH (ref 4.0–10.5)
nRBC: 0 % (ref 0.0–0.2)

## 2020-10-07 LAB — COMPREHENSIVE METABOLIC PANEL
ALT: 8 U/L (ref 0–44)
AST: 30 U/L (ref 15–41)
Albumin: 1.9 g/dL — ABNORMAL LOW (ref 3.5–5.0)
Alkaline Phosphatase: 149 U/L — ABNORMAL HIGH (ref 38–126)
Anion gap: 8 (ref 5–15)
BUN: 10 mg/dL (ref 6–20)
CO2: 24 mmol/L (ref 22–32)
Calcium: 7.6 mg/dL — ABNORMAL LOW (ref 8.9–10.3)
Chloride: 97 mmol/L — ABNORMAL LOW (ref 98–111)
Creatinine, Ser: 0.33 mg/dL — ABNORMAL LOW (ref 0.61–1.24)
GFR, Estimated: >60 mL/min (ref >60)
Glucose, Bld: 90 mg/dL (ref 70–99)
Potassium: 4.1 mmol/L (ref 3.5–5.1)
Sodium: 129 mmol/L — ABNORMAL LOW (ref 135–145)
Total Bilirubin: 0.9 mg/dL (ref 0.3–1.2)
Total Protein: 5.4 g/dL — ABNORMAL LOW (ref 6.5–8.1)

## 2020-10-07 NOTE — Progress Notes (Signed)
PROGRESS NOTE    YOUSOF Barker  VPX:[id number] DOB: 1985/12/28 DOA: 09/13/2020 PCP: [name], PA   Brief Narrative: [name] is a 35 y.o. male with a history of AVN of the hip, hypertension, GERD, alcoholic hepatitis. Patient presented secondary to left leg pain, found to have anemia and multiple leg/gluteal/iliospoas abscesses requiring multiple I&Ds. Complicated by septic shock and MSSA bacteremia in addition to cirrhosis with associated anasarca and ascites.   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 Transthoracic Echocardiogram and Transesophageal Echocardiogram obtained and were without evidence of vegetations. ID consulted with recommendations for Cafazolin/Flagyl/rifampin as mentioned below  Iliopsoas/left gluteal/groin abscesses Septic arthritis History of avascular necrosis Orthopedic surgery, IR and ID consulted. Patient underwent multiple I&Ds in addition to left total hip arthroplasty. Three drains in place including wound vac. Antibiotic coverage as mentioned above  -ID recommendations: Cefazolin, Flagyl and rifampin IV -IR recommendations for drain management -Orthopedic surgery recommendations for wound vac management  Septic shock Secondary to above. Lactic acid peak of 5.3. Managed with Levophed vasopressor support and  IV fluid resuscitation. Required ICU admission for 48 hours. Resolved.  Hypervolemic hyponatremia Secondary to fluid overload from fluid resuscitation from septic shock in addition to underlying cirrhosis/hypoalbuminemia and poor fluid balance. Nadir of 124. In/out measurements suggest  net negative for admission, however patient's weight is up 26 lbs. Weight pending today Sodium up to 127 today with goal likely between 127-130. -Continue to Lasix 40 mg BID for now, likely transition to Lasix 40 mg daily in AM -Daily BMP  Multifocal pneumonia Seen on CT scan on 3/11. No symptoms currently. On room air. Currently on antibiotic coverage for above infections.  Chronic pain -Continue MS contin BID  Symptomatic anemia Macrocytic anemia No active bleeding noted. Patient received a total of 6 units of PRBC. Hemoglobin currently stable.  Hepatic cirrhosis with ascites Anasarca History of alcoholic hepatitis GI consulted. Patient managed with intermittent paracenteses in addition to lasix and spironolactone. GI recommending EGD to screen for esophageal varices which can be done as an outpatient. Paracentesis last performed on 3/16  Scrotal edema Urology consulted. No concern for infection. In setting of anasarca as mentioned above but possibly with some reactive change from adjacent infections. No evidence of abscess or local skin infection. Improving. Urology recommending to continue foley until able to use urinal. -Continue urinary foley catheter until edema improves enough for him to urinate effectively on his own  Thrombocytopenia Likely related to acute infection. He received a total of 1 unit of platelets in addition to 1 unit of FFP. Resolved.  Sinus tachycardia Outpatient problem. Patient followed by cardiology. Managed on metoprolol.  Primary hypertension Blood pressure well controlled -Continue metoprolol  GAD -Continue Zoloft  GERD -Continue Protonix  Metabolic acidosis Resolved  Hypophosphatemia Resolved.  Hypomagnesemia Replete as needed.   DVT prophylaxis: SCDs Code Status:   Code Status: Full Code Family Communication: Wife at bedside Disposition Plan: Discharge to CIR when bed available. Sodium improved. Confirmed from orthopedic surgery  and IR that patient is appropriate for CIR   Consultants:   General surgery  Orthopedic surgery  Infectious disease  Urology  Gastroenterology  Nephrology  Procedures:   Multiple I&D  Left hip total hip arthroplasty  Antimicrobials:  Cefazolin  Flagyl  Rifampin  Cefepime  Vancomycin   Subjective: No concerns today. Thinks his swelling has continued to improve.  Objective: Vitals:   10/06/20 2113 10/07/20 0500 10/07/20 0549 10/07/20 1334  BP: 109/72  105/66 102/66  Pulse: (!) 120  99 (!) 101  Resp: 18  17 17   Temp: 98.9 F (37.2 C)  98.5 F (36.9 C) 100.3 F (37.9 C)  TempSrc: Oral  Oral Oral  SpO2: 90%  92% 92%  Weight:  85.4 kg    Height:        Intake/Output Summary (Last 24 hours) at 10/07/2020 1343 Last data filed at 10/07/2020 1000 Gross per 24 hour  Intake 300 ml  Output 2110 ml  Net -1810 ml   Filed Weights   10/05/20 0500 10/06/20 1758 10/07/20 0500  Weight: 97 kg 83.2 kg 85.4 kg    Examination:  General exam: Appears calm and comfortable Respiratory system: Clear to auscultation. Respiratory effort normal. Cardiovascular system: S1 & S2 heard, RRR. No murmurs, rubs, gallops or clicks. Gastrointestinal system: Abdomen is distended, soft and nontender. No organomegaly or masses felt. Normal bowel sounds heard. Central nervous system: Alert and oriented. No focal neurological deficits. Musculoskeletal: Whole body edema. No calf tenderness Skin: No cyanosis. No rashes Psychiatry: Judgement and insight appear normal. Mood & affect appropriate.     Data Reviewed: I have personally reviewed following labs and imaging studies  CBC Lab Results  Component Value Date   WBC 14.0 (H) 10/07/2020   RBC 2.63 (L) 10/07/2020   HGB 8.6 (L) 10/07/2020   HCT 27.1 (L) 10/07/2020   MCV 103.0 (H) 10/07/2020   MCH 32.7 10/07/2020   PLT 279 10/07/2020   MCHC 31.7 10/07/2020   RDW 18.4 (H) 10/07/2020   LYMPHSABS 2.6 10/05/2020   MONOABS 1.0  10/05/2020   EOSABS 0.3 10/05/2020   BASOSABS 0.0 10/05/2020     Last metabolic panel Lab Results  Component Value Date   NA 129 (L) 10/07/2020   K 4.1 10/07/2020   CL 97 (L) 10/07/2020   CO2 24 10/07/2020   BUN 10 10/07/2020   CREATININE 0.33 (L) 10/07/2020   GLUCOSE 90 10/07/2020   GFRNONAA >60 10/07/2020   GFRAA >60 02/24/2019   CALCIUM 7.6 (L) 10/07/2020   PHOS 4.3 10/05/2020   PROT 5.4 (L) 10/07/2020   ALBUMIN 1.9 (L) 10/07/2020   BILITOT 0.9 10/07/2020   ALKPHOS 149 (H) 10/07/2020   AST 30 10/07/2020   ALT 8 10/07/2020   ANIONGAP 8 10/07/2020    CBG (last 3)  No results for input(s): GLUCAP in the last 72 hours.   GFR: Estimated Creatinine Clearance: 131.8 mL/min (A) (by C-G formula based on SCr of 0.33 mg/dL (L)).  Coagulation Profile: No results for input(s): INR, PROTIME in the last 168 hours.  Recent Results (from the past 240 hour(s))  Body fluid culture w Gram Stain     Status: None   Collection Time: 09/30/20 12:55 PM   Specimen: Peritoneal Washings; Body Fluid  Result Value Ref Range Status   Specimen Description   Final    PERITONEAL Performed at [facility], [address] [place], [place] [address]    Special Requests   Final    Normal Performed at [facility], [address] [place], [place] [address]  Gram Stain NO WBC SEEN NO ORGANISMS SEEN   Final   Culture   Final    NO GROWTH 3 DAYS Performed at [facility], [address], [place], [place] [address]    Report Status 10/04/2020 FINAL  Final  Aerobic/Anaerobic Culture (surgical/deep wound)     Status: None   Collection Time: 09/30/20  4:56 PM   Specimen: Abscess  Result Value Ref Range Status   Specimen Description   Final    ABSCESS THIGH Performed at [facility], [address], [place], [place] [address]    Special Requests   Final    NONE Performed at [facility], [address] [place], [place] [address]    Gram Stain   Final    ABUNDANT WBC PRESENT,BOTH PMN AND MONONUCLEAR ABUNDANT GRAM POSITIVE COCCI IN PAIRS    Culture   Final    RARE STAPHYLOCOCCUS AUREUS NO ANAEROBES ISOLATED Performed at [facility], [address], [place], [place] [address]    Report Status 10/05/2020 FINAL  Final   Organism ID, Bacteria STAPHYLOCOCCUS AUREUS  Final      Susceptibility   Staphylococcus aureus - MIC*    CIPROFLOXACIN <=0.5 SENSITIVE Sensitive     ERYTHROMYCIN <=0.25 SENSITIVE Sensitive     GENTAMICIN <=0.5 SENSITIVE Sensitive     OXACILLIN <=0.25 SENSITIVE Sensitive     TETRACYCLINE <=1 SENSITIVE Sensitive     VANCOMYCIN 1 SENSITIVE Sensitive     TRIMETH/SULFA <=10 SENSITIVE Sensitive     CLINDAMYCIN <=0.25 SENSITIVE Sensitive     RIFAMPIN >=32 RESISTANT Resistant     Inducible Clindamycin NEGATIVE Sensitive     * RARE STAPHYLOCOCCUS AUREUS  Culture, blood (routine x 2)     Status: None   Collection Time: 10/01/20  2:29 PM   Specimen: BLOOD RIGHT HAND  Result Value Ref Range Status   Specimen Description   Final    BLOOD RIGHT HAND Performed at [facility], [address] [place], [place] [address]    Special Requests   Final    BOTTLES DRAWN AEROBIC ONLY Blood Culture adequate volume Performed at [facility], [address] [place], [place] [address]    Culture   Final    NO GROWTH 5 DAYS Performed at [facility], [address] [place], [place] [address]    Report Status 10/06/2020 FINAL  Final  Culture, blood (routine x 2)     Status: None   Collection Time: 10/01/20  2:29 PM   Specimen: BLOOD RIGHT FOREARM  Result Value Ref Range Status   Specimen Description   Final    BLOOD RIGHT FOREARM Performed at [facility], [address] [place], [place] [address]    Special Requests   Final    BOTTLES DRAWN AEROBIC ONLY Blood Culture adequate volume Performed at [facility], [address], [place], [place] [address]    Culture   Final    NO GROWTH 5 DAYS Performed at [facility], [address] [place], [place] [address]    Report Status 10/06/2020 FINAL  Final        Radiology Studies: No results found.      Scheduled Meds: . (feeding supplement) PROSource Plus  30 mL Oral BID BM  . Chlorhexidine Gluconate Cloth  6 each Topical Daily  . docusate sodium  100 mg Oral BID  . feeding  supplement  1 Container Oral BID BM  . folic acid  1 mg Oral Daily  . furosemide  40 mg Oral BID  . guaiFENesin  1,200 mg Oral BID  . hydrocortisone cream   Topical BID  . lactulose  10 g Oral BID  . mouth rinse  15 mL Mouth Rinse BID  . metoprolol tartrate  50 mg Oral BID  . metroNIDAZOLE  500 mg Oral Q8H  . morphine  15 mg Oral Q12H  . multivitamin with minerals  1 tablet Oral Daily  . nutrition supplement (JUVEN)  1 packet Oral BID BM  . pantoprazole  40 mg Oral BID  . rifampin  300 mg Oral Q12H  . senna  1 tablet Oral BID  . sertraline  50 mg Oral Daily  . sodium chloride flush  5 mL Intracatheter Q8H  . sodium chloride flush  5 mL Intracatheter Q8H  . spironolactone  100 mg Oral Daily  . thiamine  100 mg Oral Daily   Continuous Infusions: .  ceFAZolin (ANCEF) IV 2 g (10/07/20 0536)     LOS: 24 days     Jacquelin Hawking, MD [facility] 10/07/2020, 1:43 PM  If 7PM-7AM, please contact night-coverage [email or web address]

## 2020-10-07 NOTE — Progress Notes (Signed)
Occupational Therapy Treatment Patient Details Name: [name] MRN: [id number] DOB: [birth year] Today's Date: 10/07/2020    History of present illness [name] is a 35 y.o. male with medical history significant of AVN of hip ( originally was scheduled for a THA this coming March2022), HTN, GERD. Recent admission for COVID , acute renal failure and alcoholic hepatitis (08/22/20- 08/29/20). He was ok for a few days after discharge, but he began to feel very tired, weak, left thigh and leg pain and progressed over the last 2 weeks. He reports that the left thigh had become increasingly swollen and tender to touch. He has had a blister rupture on the inner thigh in the groin area. These symptoms increased through 2 days ago when he was no longer ago to walk without great pain. 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 Left thigh I & D and wound vac placement 3/1. s/p I&D groin abcess by general surgery 09/23/19. Additional drain placed L thigh per IR 09/23/20.  On 09/27/2020 he underwent I&D of the left thigh again with partial closure and surgery did an I&D of the left groin abscess.  S/P paracentesis 09/30/20.  IR plans for pulling of hip/retroperitoneal drain, aspirating a small persistent fluid collection in the left iliacus, aspiration/drainage of proximal lateral thigh collection, continuing remaining drains on bulb suction.   OT comments  Patient motivated to work with OT this AM. Agreeable to work on standing tolerance and balance in order to build overall strength and stability needed for functional transfers and ADLs. Patient require mod A for L LE management and trunk  support to sit upright EOB. With bed height elevated and use of B UEs on stedy patient min A x2 to power up to standing. Patient instructed in LE exercises listed below in order to maximize strength in preparation for functional transfers such as toilet transfer. Patient also progress to briefly maintaining fair standing balance in stedy during functional reaching task. Overall patient tolerate 6 minutes of standing in stedy before returned to EOB. Patient has wound vac change this AM therefore returned to bed vs chair. Continue to recommend aggressive short term rehab.   Follow Up Recommendations  CIR    Equipment Recommendations  None recommended by OT       Precautions / Restrictions Precautions Precautions: Fall;Posterior Hip Precaution Comments: pre medicate for pain, multiple lines/drains Restrictions Weight Bearing Restrictions: Yes LLE Weight Bearing: Weight bearing as tolerated Other Position/Activity Restrictions: 3 JP drains in Lt anterior thigh and wound vac       Mobility Bed Mobility Overal bed mobility: Needs Assistance Bed Mobility: Supine to Sit;Sit to Supine     Supine to sit: Mod assist;HOB elevated Sit to supine: Mod assist   General bed mobility comments: mod A to mobilize L LE and elevate trunk to upright position with patient using trapeze and bed rail to assist. Patient having difficulty lifting LEs back onto bed therefore mod A to complete/maintain hip precautions    Transfers Overall transfer level: Needs assistance   Transfers: Sit to/from Stand Sit to Stand: Min assist;+2 physical assistance;+2 safety/equipment;From elevated surface         General transfer comment: improved mobility this session, patient able  to power up to standing with min x2 and B UE support pulling on stedy.    Balance Overall balance assessment: Needs assistance Sitting-balance support: Feet supported Sitting balance-Leahy Scale: Fair     Standing balance support: No  upper extremity supported Standing balance-Leahy Scale: Fair Standing balance comment: patient able to maintain brief static stand without UE support in stedy                           ADL either performed or assessed with clinical judgement   ADL Overall ADL's : Needs assistance/impaired                         Toilet Transfer: Minimal assistance;+2 for physical assistance;+2 for safety/equipment Toilet Transfer Details (indicate cue type and reason): from elevated bed height and use of stedy worked on standing tolerance and global strengthening necessary to safely complete functional transfers. Patient able to power up to standing with min A x2, improved mobility from previous session                           Cognition Arousal/Alertness: Awake/alert Behavior During Therapy: WFL for tasks assessed/performed Overall Cognitive Status: Within Functional Limits for tasks assessed                                          Exercises Exercises: Other exercises Other Exercises Other Exercises: patient tolerate standing in stedy for 6 minutes while perform x5 reps of calf raises, x10 mini squats and attempting to march in place however knee guard on stedy limiting ROM.           Pertinent Vitals/ Pain       Pain Assessment: 0-10 Pain Score: 7  Pain Location: L LE Pain Descriptors / Indicators: Grimacing;Operative site guarding Pain Intervention(s): Monitored during session         Frequency  Min 2X/week        Progress Toward Goals  OT Goals(current goals can now be found in the care plan section)  Progress towards OT goals: Progressing toward goals  Acute Rehab OT Goals Patient Stated Goal: to be able to walk again and more independent OT Goal Formulation: With patient Time For Goal Achievement: 10/18/20 Potential to Achieve Goals: Good ADL Goals Pt Will Perform Lower Body Dressing: with supervision;with adaptive  equipment;sit to/from stand Pt Will Transfer to Toilet: ambulating;grab bars;regular height toilet;with min guard assist Pt Will Perform Toileting - Clothing Manipulation and hygiene: with supervision;sit to/from stand Additional ADL Goal #1: Patient will stand at sink to perform grooming task as evidence of improving activity tolerance Additional ADL Goal #2: Patient will perform supine to sit with min assist in preparation for functional task.  Plan Discharge plan remains appropriate       AM-PAC OT "6 Clicks" Daily Activity     Outcome Measure   Help from another person eating meals?: None Help from another person taking care of personal grooming?: A Little Help from another person toileting, which includes using toliet, bedpan, or urinal?: A Lot Help from another person bathing (including washing, rinsing, drying)?: A Lot Help from another person to put on and taking off regular upper body clothing?: A Little Help from another person to put on and taking off regular lower body  clothing?: Total 6 Click Score: 15    End of Session Equipment Utilized During Treatment: Gait belt;Other (comment) (stedy)  OT Visit Diagnosis: Other abnormalities of gait and mobility (R26.89);Muscle weakness (generalized) (M62.81);Pain Pain - Right/Left: Left Pain - part of body: Hip;Leg   Activity Tolerance Patient tolerated treatment well   Patient Left in bed;with call bell/phone within reach;with family/visitor present   Nurse Communication Mobility status        Time: 7902-4097 OT Time Calculation (min): 25 min  Charges: OT General Charges $OT Visit: 1 Visit OT Treatments $Self Care/Home Management : 23-37 mins  Marlyce Huge OT OT pager: [phone]   Carmelia Roller 10/07/2020, 9:12 AM

## 2020-10-07 NOTE — Consult Note (Signed)
WOC Nurse wound follow up Patient premedicated with Dilaudid and partner at bedside providing emotional support.  Wound type:Left anterior  thigh abscess.  NPWT (VAC) dressing change.  Distal end with intact sutures.   Measurement:nonintact 7 cm x 5 cm x 3 cm with 8 cm suture line to distal segment.  Due to pain with drape removal, I applied skin prep to suture line and barrier strip.  Wound IDH:WYSHU red, bleeds with removal and cleansing.   Drainage (amount, consistency, odor) minimal serosanguinous drainage  No odor.  Periwound:intact - groin has abscess healing by secondary intention with NS moist gauze.  Dressing procedure/placement/frequency:1 piece black foam.  Skin prep and barrier ring (strip) to suture line to minimize pain with removal. Covered with drape and seal immediately achieved.  Change mon/Wed/Fri.  Anticipating move to CIR soon.  Will follow.   Maple Hudson MSN, RN, FNP-BC CWON Wound, Ostomy, Continence Nurse Pager [phone]

## 2020-10-07 NOTE — TOC Progression Note (Signed)
Transition of Care [facility]) - Progression Note    Patient Details  Name: [name] MRN: [id number] Date of Birth: 1986/07/20  [facility]) CM/SW Contact  Amada Jupiter, LCSW Phone Number: 10/07/2020, 3:26 PM  Clinical Narrative:    Have alerted CIR admissions that, per MD, pt has been cleared by all medical services to make the transition to CIR.  CIR admissions has begun insurance authorization and will alert MD and team when bed ready.   Have reviewed this with pt who is agreeable and denies any questions/ concerns at this time.  Will alert weekend TOC as well.   Expected Discharge Plan: IP Rehab Facility (CIR) Barriers to Discharge: Continued Medical Work up  Expected Discharge Plan and Services Expected Discharge Plan: IP Rehab Facility (CIR)   Discharge Planning Services: CM Consult   Living arrangements for the past 2 months: Single Family Home                                       Social Determinants of Health (SDOH) Interventions    Readmission Risk Interventions No flowsheet data found.

## 2020-10-07 NOTE — Progress Notes (Signed)
Inpatient Rehab Admissions Coordinator:  Pt not medically ready to admit to CIR.  Will continue to follow.   Lauren Graves Madden, MS, CCC-SLP Admissions Coordinator [phone]  

## 2020-10-07 NOTE — Progress Notes (Signed)
Subjective:  No complaints this morning, had recently received morphine. Denies new pain, nausea, vomiting, paraesthesias.   Objective:  PE: VITALS:   Vitals:   10/06/20 1758 10/06/20 2113 10/07/20 0500 10/07/20 0549  BP:  109/72  105/66  Pulse:  (!) 120  99  Resp:  18  17  Temp:  98.9 F (37.2 C)  98.5 F (36.9 C)  TempSrc:  Oral  Oral  SpO2:  90%  92%  Weight: 83.2 kg  85.4 kg   Height: 5\' 10"  (1.778 m)      General: Laying down in bed, in no acute distress MSK: left hip incision, still draining serous fluid but otherwise still well opposed. Left lateral thigh edematous. Left leg incision with wound vac intact and approx 200 cc's of serous fluid in canister. Left groin with packing and ABD's, C/D/I. Non tender left calf. Sensation intact to all aspects of left foot.No edema of left foot+ DP pulse.Dorsiflexion/Plantar flexion intact.   LABS  Results for orders placed or performed during the hospital encounter of 09/13/20 (from the past 24 hour(s))  Comprehensive metabolic panel     Status: Abnormal   Collection Time: 10/07/20  4:44 AM  Result Value Ref Range   Sodium 129 (L) 135 - 145 mmol/L   Potassium 4.1 3.5 - 5.1 mmol/L   Chloride 97 (L) 98 - 111 mmol/L   CO2 24 22 - 32 mmol/L   Glucose, Bld 90 70 - 99 mg/dL   BUN 10 6 - 20 mg/dL   Creatinine, Ser 10/09/20 (L) 0.61 - 1.24 mg/dL   Calcium 7.6 (L) 8.9 - 10.3 mg/dL   Total Protein 5.4 (L) 6.5 - 8.1 g/dL   Albumin 1.9 (L) 3.5 - 5.0 g/dL   AST 30 15 - 41 U/L   ALT 8 0 - 44 U/L   Alkaline Phosphatase 149 (H) 38 - 126 U/L   Total Bilirubin 0.9 0.3 - 1.2 mg/dL   GFR, Estimated 7.82 >95 mL/min   Anion gap 8 5 - 15  CBC     Status: Abnormal   Collection Time: 10/07/20  4:44 AM  Result Value Ref Range   WBC 14.0 (H) 4.0 - 10.5 K/uL   RBC 2.63 (L) 4.22 - 5.81 MIL/uL   Hemoglobin 8.6 (L) 13.0 - 17.0 g/dL   HCT 10/09/20 (L) 13.0 - 86.5 %   MCV 103.0 (H) 80.0 - 100.0 fL   MCH 32.7 26.0 - 34.0 pg   MCHC 31.7 30.0 - 36.0  g/dL   RDW 78.4 (H) 69.6 - 29.5 %   Platelets 279 150 - 400 K/uL   nRBC 0.0 0.0 - 0.2 %    28.4 Paracentesis  Result Date: 10/05/2020 INDICATION: Patient with history of septic arthritis, staph bacteremia, alcoholic cirrhosis, recurrent ascites. Request is made for therapeutic paracentesis. EXAM: ULTRASOUND GUIDED THERAPEUTIC PARACENTESIS MEDICATIONS: 10 mL 1% lidocaine COMPLICATIONS: None immediate. PROCEDURE: Informed written consent was obtained from the patient after a discussion of the risks, benefits and alternatives to treatment. A timeout was performed prior to the initiation of the procedure. Initial ultrasound scanning demonstrates a moderate amount of ascites within the right lower abdominal quadrant. The right lower abdomen was prepped and draped in the usual sterile fashion. 1% lidocaine was used for local anesthesia. Following this, a 19 gauge, 7-cm, Yueh catheter was introduced. An ultrasound image was saved for documentation purposes. The paracentesis was performed. The catheter was removed and a dressing was applied. The patient  tolerated the procedure well without immediate post procedural complication. FINDINGS: A total of approximately 3.5 liters of pale yellow fluid was removed. IMPRESSION: Successful ultrasound-guided therapeutic paracentesis yielding 3.5 liters of peritoneal fluid. Read by: Loyce Dys PA-C Electronically Signed   By: Gilmer Mor D.O.   On: 10/05/2020 13:36    Assessment/Plan: Lefthipseptic arthritis/AVN,multiple left thigh and retroperitonealabscesses - 2/23 - placement of drain into left retroperitoneal abscess and two into left thigh by IR - 2/24 -left hip total arthroplasty with cement interposition spacer - 3/1 - left thigh irrigation and debridement with wound vac placementby orthopedics, removal of left retroperitoneal drain - 3/3 - I&D of left groin abscess with change of wound vac by general surgery 3/4 - CT guided drainage of left thigh  fluid collection by IR - 3/8 - left thigh repeat irrigation and debridement by orthopedics with partial closure of wound, incision and drainage of left thigh abscess/left groin abscessby general surgery 3/11 - Removal ofleftpsoas drain, CT guided aspiration ofleftiliacus abscess, CT guided drain placement of left anterior thigh abscess by IR - WBATLLE, continue OT and PT as able -defer to primary forpharmacologic DVT prophylaxis due to coagulopathy,continueSCD's  - continue BID dressing changes to left hip woundwith gauze and ABD's - still drainage serous fluid. Will plan to keep wound vac on, continuing to drain serous fluid.  - patient ok to discharge to CIR from our standpoint and will continue to follow along while at CIR, continue dressing changes to anterior thigh and groin as per WOC. I will plan to remove staples at left hip on Monday whether he is at [facility] or still inpatient.   Contact information:    Weekdays 8-5 [address], [place] [phone] A fter hours and holidays please check [email or web address] for group call information for [facility]  Armida Sans 10/07/2020, 10:21 AM

## 2020-10-07 NOTE — Progress Notes (Signed)
[facility] Progress Note  CC: Cirrhosis, ascites  Subjective:  He denies having any N/V. No abdominal pain. He is passing a normal brown BM once or twice daily, last BM was last night. No rectal bleeding or melena. No complaints at this time.    Objective:  Vital signs in last 24 hours: Temp:  [98.3 F (36.8 C)-98.9 F (37.2 C)] 98.5 F (36.9 C) (03/18 0549) Pulse Rate:  [99-120] 99 (03/18 0549) Resp:  [17-18] 17 (03/18 0549) BP: (104-109)/(66-74) 105/66 (03/18 0549) SpO2:  [90 %-92 %] 92 % (03/18 0549) Weight:  [83.2 kg-85.4 kg] 85.4 kg (03/18 0500) Last BM Date: 10/06/20 General:   Alert 35 year old male in NAD.  Heart: RRR, no murmurs.  Pulm:  Breath sounds clear throughout.  Abdomen: Soft, mild gaseous distension with a small amount of ascites. Abdomen is not tense. Nontender. + BS x 4 quads. No HSM.  Extremities:  LE edema. Left thigh drsg with intact perc drains.  Neurologic:  Alert and  oriented x4;  grossly normal neurologically. Psych:  Alert and cooperative. Normal mood and affect.  Intake/Output from previous day: 03/17 0701 - 03/18 0700 In: 53 [P.O.:120; IV Piggyback:300] Out: 2160 [Urine:2050; Drains:110] Intake/Output this shift: Total I/O In: -  Out: 260 [Urine:200; Drains:60]  Lab Results: Recent Labs    10/05/20 0446 10/07/20 0444  WBC 13.1* 14.0*  HGB 7.7* 8.6*  HCT 24.1* 27.1*  PLT 234 279   BMET Recent Labs    10/05/20 0446 10/06/20 0927 10/07/20 0444  NA 124* 127* 129*  K 3.7 3.9 4.1  CL 96* 96* 97*  CO2 _0 GLUCOSE 91 93 90  BUN _1 CREATININE <0.30* <0.30* 0.33*  CALCIUM 7.7* 7.6* 7.6*   LFT Recent Labs    10/07/20 0444  PROT 5.4*  ALBUMIN 1.9*  AST 30  ALT 8  ALKPHOS 149*  BILITOT 0.9   PT/INR No results for input(s): LABPROT, INR in the last 72 hours. Hepatitis Panel No results for input(s): HEPBSAG, HCVAB, HEPAIGM, HEPBIGM in the last 72 hours.  US Paracentesis  Result Date:  10/05/2020 INDICATION: Patient with history of septic arthritis, staph bacteremia, alcoholic cirrhosis, recurrent ascites. Request is made for therapeutic paracentesis. EXAM: ULTRASOUND GUIDED THERAPEUTIC PARACENTESIS MEDICATIONS: 10 mL 1% lidocaine COMPLICATIONS: None immediate. PROCEDURE: Informed written consent was obtained from the patient after a discussion of the risks, benefits and alternatives to treatment. A timeout was performed prior to the initiation of the procedure. Initial ultrasound scanning demonstrates a moderate amount of ascites within the right lower abdominal quadrant. The right lower abdomen was prepped and draped in the usual sterile fashion. 1% lidocaine was used for local anesthesia. Following this, a 19 gauge, 7-cm, Yueh catheter was introduced. An ultrasound image was saved for documentation purposes. The paracentesis was performed. The catheter was removed and a dressing was applied. The patient tolerated the procedure well without immediate post procedural complication. FINDINGS: A total of approximately 3.5 liters of pale yellow fluid was removed. IMPRESSION: Successful ultrasound-guided therapeutic paracentesis yielding 3.5 liters of peritoneal fluid. Read by: Brynda Greathouse PA-C Electronically Signed   By: Corrie Mckusick D.O.   On: 10/05/2020 13:36    Assessment / Plan:  26.  35 year old male with EtOH hepatitis and decompensated EtoH cirrhosis. S/P paracentesis 3/11 with removal of 3L of peritoneal fluid 3/11. No SBP.  Cytology negative for malignancy. S/P paracentesis 3/16 with 3.5L of peritoneal fluid removed (labs  not done).  Alk phos 149. AST 30. ALT 8. T. Bili 0.9. No N/V or abdominal pain. -Continue Furosemide 18m QD an Spironolactone 1050mQD -Continue Lactulose 10gm po bid -Continue Pantoprazole 4061mo bid  -Eventual EGD to survey for varices, most likely as an outpatient  -Further recommendations per Dr. BeaTarri Glenn2. Anemia. Hg 7.7 -> 8.6. MCV 103.0. No overt  GI bleeding.   3. Hyponatremia. Na+ 129.   3. Left iliopsoas and left  gluteal abscesses with MMSA bacteremia and left hip effusion, had left hip interposition arthroplasty on 09/15/2020 and subsequently developed decompensated septic shock and required vasopressor support and he has undergone several drainage procedures since then. Followed by general surgery. WBC 13.1 -> 14.    Principal Problem:   MSSA bacteremia Active Problems:   Alcohol dependence ([place]CC[place] Alcoholic hepatitis   Sepsis ([place]CC[place] Septic arthritis (HCC[place] Iliopsoas abscess on left (HCC[place]ceans[place] Ascites due to alcoholic hepatitis   Portal hypertension ([place]CC[place] Alcoholic cirrhosis of liver with ascites ([facility]CC[place]   LOS: 24 days   ColNoralyn Pick/18/2022, 1:06 PM

## 2020-10-08 ENCOUNTER — Inpatient Hospital Stay: Payer: Self-pay

## 2020-10-08 LAB — COMPREHENSIVE METABOLIC PANEL
ALT: 6 U/L (ref 0–44)
AST: 26 U/L (ref 15–41)
Albumin: 1.7 g/dL — ABNORMAL LOW (ref 3.5–5.0)
Alkaline Phosphatase: 140 U/L — ABNORMAL HIGH (ref 38–126)
Anion gap: 9 (ref 5–15)
BUN: 12 mg/dL (ref 6–20)
CO2: 23 mmol/L (ref 22–32)
Calcium: 7.7 mg/dL — ABNORMAL LOW (ref 8.9–10.3)
Chloride: 98 mmol/L (ref 98–111)
Creatinine, Ser: 0.31 mg/dL — ABNORMAL LOW (ref 0.61–1.24)
GFR, Estimated: >60 mL/min (ref >60)
Glucose, Bld: 86 mg/dL (ref 70–99)
Potassium: 3.6 mmol/L (ref 3.5–5.1)
Sodium: 130 mmol/L — ABNORMAL LOW (ref 135–145)
Total Bilirubin: 0.7 mg/dL (ref 0.3–1.2)
Total Protein: 5.1 g/dL — ABNORMAL LOW (ref 6.5–8.1)

## 2020-10-08 NOTE — Progress Notes (Signed)
Physical Therapy Treatment Patient Details Name: [name] MRN: [id number] DOB: Sep 05, 1985 Today's Date: 10/08/2020    History of Present Illness [name] is a 35 y.o. male with medical history significant of AVN of hip ( originally was scheduled for a THA this coming March2022), HTN, GERD. Recent admission for COVID , acute renal failure and alcoholic hepatitis (08/22/20- 08/29/20). He was ok for a few days after discharge, but he began to feel very tired, weak, left thigh and leg pain and progressed over the last 2 weeks. He reports that the left thigh had become increasingly swollen and tender to touch. He has had a blister rupture on the inner thigh in the groin area. These symptoms increased through 2 days ago when he was no longer ago to walk without great pain. 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 Left thigh I & D and wound vac placement 3/1. s/p I&D groin abcess by general surgery 09/23/19. Additional drain placed L thigh per IR 09/23/20.  On 09/27/2020 he underwent I&D of the left thigh again with partial closure and surgery did an I&D of the left groin abscess.  S/P paracentesis 09/30/20.  IR plans for pulling of hip/retroperitoneal drain, aspirating a small persistent fluid collection in the left iliacus, aspiration/drainage of proximal lateral thigh collection, continuing remaining drains on bulb suction.    PT Comments    Pt very cooperative and progressing slowly but steadily with mobility.  This date, pt performed therex program with assist, mobilized to EOB sitting and ambulated limited distance in room to sit in recliner.  Pt and significant other  pleased with progress this date.   Follow Up Recommendations  CIR     Equipment Recommendations  Rolling walker with 5" wheels;3in1 (PT)    Recommendations for Other Services Rehab consult     Precautions / Restrictions Precautions Precautions: Fall;Posterior Hip Precaution Comments: Pt able to recall all THP; multiple lines and drains Restrictions Weight Bearing Restrictions: No LLE Weight Bearing: Weight bearing as tolerated    Mobility  Bed Mobility Overal bed mobility: Needs Assistance Bed Mobility: Supine to Sit     Supine to sit: Min assist;+2 for physical assistance;+2 for safety/equipment     General bed mobility comments: Increased time with cues for sequence and use of R LE to self assist.  Physical assist to manage L LE and to bring trunk to upright    Transfers Overall transfer level: Needs assistance Equipment used: Rolling walker (2 wheeled) Transfers: Sit to/from Stand Sit to Stand: Min assist;+2 physical assistance;+2 safety/equipment;From elevated surface         General transfer comment: cues for LE management and use of UEs to self assist  Ambulation/Gait Ambulation/Gait assistance: Min assist;+2 safety/equipment Gait Distance (Feet): 7 Feet Assistive device: Rolling walker (2 wheeled) Gait Pattern/deviations: Step-to pattern;Decreased stride length;Shuffle;Antalgic Gait velocity: decr   General Gait Details: cues for sequence, posture and position from [employer]             Wheelchair Mobility    Modified Rankin (Stroke Patients Only)       Balance Overall balance assessment: Needs assistance Sitting-balance support: Feet supported Sitting balance-Leahy Scale: Fair Sitting balance - Comments: Using  arms to position him self more posteriorly however able to lift arms off of bed and maintain sitting balance   Standing balance support: Bilateral upper extremity supported Standing balance-Leahy Scale: Poor                               Cognition Arousal/Alertness: Awake/alert Behavior During Therapy: WFL for tasks assessed/performed Overall Cognitive Status: Within Functional Limits for tasks assessed                                        Exercises Total Joint Exercises Ankle Circles/Pumps: AROM;Both;Supine;15 reps Quad Sets: AROM;Both;10 reps;Supine Heel Slides: AAROM;Both;Supine;20 reps Hip ABduction/ADduction: AAROM;Left;15 reps;Supine    General Comments        Pertinent Vitals/Pain Pain Assessment: 0-10 Pain Score: 5  Pain Location: L LE Pain Descriptors / Indicators: Grimacing;Sore Pain Intervention(s): Limited activity within patient's tolerance;Monitored during session;Premedicated before session    Home Living                      Prior Function            PT Goals (current goals can now be found in the care plan section) Acute Rehab PT Goals Patient Stated Goal: to be able to walk again and more independent PT Goal Formulation: With patient Time For Goal Achievement: 10/14/20 Potential to Achieve Goals: Good Progress towards PT goals: Progressing toward goals    Frequency    Min 4X/week      PT Plan Current plan remains appropriate    Co-evaluation              AM-PAC PT "6 Clicks" Mobility   Outcome Measure  Help needed turning from your back to your side while in a flat bed without using bedrails?: A Lot Help needed moving from lying on your back to sitting on the side of a flat bed without using bedrails?: A Lot Help needed moving to and from a bed to a chair (including a wheelchair)?: A Lot Help needed standing up from a chair using your arms (e.g., wheelchair or bedside chair)?: A Lot Help needed to walk in hospital room?: A Lot Help needed climbing 3-5 steps with a railing? : Total 6 Click Score: 11    End of Session Equipment Utilized During Treatment: Gait belt Activity Tolerance: Patient limited by  fatigue;Patient limited by pain Patient left: in chair;with call bell/phone within reach Nurse Communication: Mobility status PT Visit Diagnosis: Other abnormalities of gait and mobility (R26.89);Muscle weakness (generalized) (M62.81);Pain Pain - Right/Left: Left Pain - part of body: Hip     Time: 2952-8413 PT Time Calculation (min) (ACUTE ONLY): 39 min  Charges:  $Gait Training: 8-22 mins $Therapeutic Exercise: 8-22 mins $Therapeutic Activity: 8-22 mins                     Mauro Kaufmann PT Acute Rehabilitation Services Pager [phone] Office [phone]    Takhia Spoon 10/08/2020, 3:14 PM

## 2020-10-08 NOTE — Progress Notes (Addendum)
PROGRESS NOTE    [name]  AQT:[id number] DOB: 1986-07-21 DOA: 09/13/2020 PCP: [name], PA   Brief Narrative: [name] is a 35 y.o. male with a history of AVN of the hip, hypertension, GERD, alcoholic hepatitis. Patient presented secondary to left leg pain, found to have anemia and multiple leg/gluteal/iliospoas abscesses requiring multiple I&Ds. Complicated by septic shock and MSSA bacteremia in addition to cirrhosis with associated anasarca and ascites.   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 Transthoracic Echocardiogram and Transesophageal Echocardiogram obtained and were without evidence of vegetations. ID consulted with recommendations for Cafazolin/Flagyl/rifampin as mentioned below  Iliopsoas/left gluteal/groin abscesses Septic arthritis History of avascular necrosis Orthopedic surgery, IR and ID consulted. Patient underwent multiple I&Ds in addition to left total hip arthroplasty. Three drains in place including wound vac. Antibiotic coverage as mentioned above  -ID recommendations: Cefazolin, Flagyl and rifampin IV until April 8th with reevaluation at that time -IR recommendations for drain management -Orthopedic surgery recommendations for wound vac management -PICC line  Septic shock Secondary to above. Lactic acid peak of 5.3. Managed with Levophed vasopressor support and  IV fluid resuscitation. Required ICU admission for 48 hours. Resolved.  Hypervolemic hyponatremia Secondary to fluid overload from fluid resuscitation from septic shock in addition to underlying cirrhosis/hypoalbuminemia and  poor fluid balance. Nadir of 124. In/out measurements suggest net negative for admission, however patient's weight is up 26 lbs. Weight pending today Sodium up to 127 today with goal likely between 127-130. -Continue Lasix 40 mg BID -Daily BMP  Multifocal pneumonia Seen on CT scan on 3/11. No symptoms currently. On room air. Currently on antibiotic coverage for above infections.  Chronic pain -Continue MS contin BID  Symptomatic anemia Macrocytic anemia No active bleeding noted. Patient received a total of 6 units of PRBC. Hemoglobin currently stable.  Hepatic cirrhosis with ascites Anasarca History of alcoholic hepatitis GI consulted. Patient managed with intermittent paracenteses in addition to lasix and spironolactone. GI recommending EGD to screen for esophageal varices which can be done as an outpatient. Paracentesis last performed on 3/16  Scrotal edema Urology consulted. No concern for infection. In setting of anasarca as mentioned above but possibly with some reactive change from adjacent infections. No evidence of abscess or local skin infection. Improving. Urology recommending to continue foley until able to use urinal. -Continue urinary foley catheter until edema improves enough for him to urinate effectively on his own  Thrombocytopenia Likely related to acute infection. He received a total of 1 unit of platelets in addition to 1 unit of FFP. Resolved.  Sinus tachycardia Outpatient problem. Patient followed by cardiology. Managed on metoprolol.  Primary hypertension Blood pressure well controlled -Continue metoprolol  GAD -Continue Zoloft  GERD -Continue Protonix  Metabolic acidosis Resolved  Hypophosphatemia Resolved.  Hypomagnesemia Replete as needed.   DVT prophylaxis: SCDs Code Status:   Code Status: Full Code Family Communication: Wife at bedside Disposition Plan: Discharge to CIR when bed available. Sodium improved. Confirmed from orthopedic  surgery and IR that patient is appropriate for CIR   Consultants:   General surgery  Orthopedic surgery  Infectious disease  Urology  Gastroenterology  Nephrology  Procedures:   Multiple I&D  Left hip total hip arthroplasty  Antimicrobials:  Cefazolin  Flagyl  Rifampin  Cefepime  Vancomycin   Subjective: No issues overnight.  Objective: Vitals:   10/07/20 1334 10/07/20 2159 10/08/20 0521 10/08/20 0905  BP: 102/66 113/68 109/69 110/71  Pulse: (!) 101 (!) 107 100 (!) 104  Resp: 17 17 17 16   Temp: 100.3 F (37.9 C) 99.6 F (37.6 C) 99.1 F (37.3 C) 98.2 F (36.8 C)  TempSrc: Oral Oral Oral Oral  SpO2: 92% 91% 91% 92%  Weight:      Height:        Intake/Output Summary (Last 24 hours) at 10/08/2020 1240 Last data filed at 10/08/2020 1000 Gross per 24 hour  Intake 1074.1 ml  Output 1980 ml  Net -905.9 ml   Filed Weights   10/05/20 0500 10/06/20 1758 10/07/20 0500  Weight: 97 kg 83.2 kg 85.4 kg    Examination:  General exam: Appears calm and comfortable Respiratory system: Respiratory effort normal. Gastrointestinal system: Abdomen is distended Central nervous system: Asleep.  Musculoskeletal: Generalized edema. Skin: No cyanosis. No new rashes    Data Reviewed: I have personally reviewed following labs and imaging studies  CBC Lab Results  Component Value Date   WBC 14.0 (H) 10/07/2020   RBC 2.63 (L) 10/07/2020   HGB 8.6 (L) 10/07/2020   HCT 27.1 (L) 10/07/2020   MCV 103.0 (H) 10/07/2020   MCH 32.7 10/07/2020   PLT 279 10/07/2020   MCHC 31.7 10/07/2020   RDW 18.4 (H) 10/07/2020   LYMPHSABS 2.6 10/05/2020   MONOABS 1.0 10/05/2020   EOSABS 0.3 10/05/2020   BASOSABS 0.0 10/05/2020     Last metabolic panel Lab Results  Component Value Date   NA 130 (L) 10/08/2020   K 3.6 10/08/2020   CL 98 10/08/2020   CO2 23 10/08/2020   BUN 12 10/08/2020   CREATININE 0.31 (L) 10/08/2020   GLUCOSE 86 10/08/2020   GFRNONAA >60  10/08/2020   GFRAA >60 02/24/2019   CALCIUM 7.7 (L) 10/08/2020   PHOS 4.3 10/05/2020   PROT 5.1 (L) 10/08/2020   ALBUMIN 1.7 (L) 10/08/2020   BILITOT 0.7 10/08/2020   ALKPHOS 140 (H) 10/08/2020   AST 26 10/08/2020   ALT 6 10/08/2020   ANIONGAP 9 10/08/2020    CBG (last 3)  No results for input(s): GLUCAP in the last 72 hours.   GFR: Estimated Creatinine Clearance: 131.8 mL/min (A) (by C-G formula based on SCr of 0.31 mg/dL (L)).  Coagulation Profile: No results for input(s): INR, PROTIME in the last 168 hours.  Recent Results (from the past 240 hour(s))  Body fluid culture w Gram Stain     Status: None   Collection Time: 09/30/20 12:55 PM   Specimen: Peritoneal Washings; Body Fluid  Result Value Ref Range Status   Specimen Description   Final    PERITONEAL Performed at [facility], [address] [place], [place]    Special Requests   Final    Normal Performed at [facility], [address] [place], [place]    Gram Stain NO WBC SEEN NO ORGANISMS SEEN   Final   Culture   Final    NO GROWTH 3 DAYS Performed at [facility], [address], [place], [place]    Report Status 10/04/2020 FINAL  Final  Aerobic/Anaerobic Culture (surgical/deep wound)  Status: None   Collection Time: 09/30/20  4:56 PM   Specimen: Abscess  Result Value Ref Range Status   Specimen Description   Final    ABSCESS THIGH Performed at [facility], [address] [place], [place] [address]    Special Requests   Final    NONE Performed at [facility], [address] [place], [place] [address]    Gram Stain   Final    ABUNDANT WBC PRESENT,BOTH PMN AND MONONUCLEAR ABUNDANT GRAM POSITIVE COCCI IN PAIRS    Culture   Final    RARE STAPHYLOCOCCUS AUREUS NO ANAEROBES ISOLATED Performed at [facility], [address], [place], [place] [address]    Report Status  10/05/2020 FINAL  Final   Organism ID, Bacteria STAPHYLOCOCCUS AUREUS  Final      Susceptibility   Staphylococcus aureus - MIC*    CIPROFLOXACIN <=0.5 SENSITIVE Sensitive     ERYTHROMYCIN <=0.25 SENSITIVE Sensitive     GENTAMICIN <=0.5 SENSITIVE Sensitive     OXACILLIN <=0.25 SENSITIVE Sensitive     TETRACYCLINE <=1 SENSITIVE Sensitive     VANCOMYCIN 1 SENSITIVE Sensitive     TRIMETH/SULFA <=10 SENSITIVE Sensitive     CLINDAMYCIN <=0.25 SENSITIVE Sensitive     RIFAMPIN >=32 RESISTANT Resistant     Inducible Clindamycin NEGATIVE Sensitive     * RARE STAPHYLOCOCCUS AUREUS  Culture, blood (routine x 2)     Status: None   Collection Time: 10/01/20  2:29 PM   Specimen: BLOOD RIGHT HAND  Result Value Ref Range Status   Specimen Description   Final    BLOOD RIGHT HAND Performed at [facility], [address] [place], [place] [address]    Special Requests   Final    BOTTLES DRAWN AEROBIC ONLY Blood Culture adequate volume Performed at [facility], [address] [place], [place] [address]    Culture   Final    NO GROWTH 5 DAYS Performed at [facility], [address], [place], [place] [address]    Report Status 10/06/2020 FINAL  Final  Culture, blood (routine x 2)     Status: None   Collection Time: 10/01/20  2:29 PM   Specimen: BLOOD RIGHT FOREARM  Result Value Ref Range Status   Specimen Description   Final    BLOOD RIGHT FOREARM Performed at [facility], [address] [place], [place] [address]    Special Requests   Final    BOTTLES DRAWN AEROBIC ONLY Blood Culture adequate volume Performed at [facility], [address], [place], [place] [address]    Culture   Final    NO GROWTH 5 DAYS Performed at [facility], [address], [place], [place] [address]    Report Status 10/06/2020 FINAL  Final        Radiology Studies: No results found.      Scheduled Meds: .  (feeding supplement) PROSource Plus  30 mL Oral BID BM  . Chlorhexidine Gluconate Cloth  6 each Topical Daily  . docusate sodium  100 mg Oral BID  . feeding supplement  1 Container Oral BID BM  . folic acid  1 mg Oral Daily  . furosemide  40 mg Oral BID  . guaiFENesin  1,200 mg Oral BID  . hydrocortisone cream   Topical BID  . lactulose  10 g Oral BID  . mouth rinse  15 mL  Mouth Rinse BID  . metoprolol tartrate  50 mg Oral BID  . metroNIDAZOLE  500 mg Oral Q8H  . morphine  15 mg Oral Q12H  . multivitamin with minerals  1 tablet Oral Daily  . nutrition supplement (JUVEN)  1 packet Oral BID BM  . pantoprazole  40 mg Oral BID  . rifampin  300 mg Oral Q12H  . senna  1 tablet Oral BID  . sertraline  50 mg Oral Daily  . sodium chloride flush  5 mL Intracatheter Q8H  . sodium chloride flush  5 mL Intracatheter Q8H  . spironolactone  100 mg Oral Daily  . thiamine  100 mg Oral Daily   Continuous Infusions: .  ceFAZolin (ANCEF) IV 2 g (10/08/20 0511)     LOS: 25 days     Jacquelin Hawkingalph Nettey, MD [facility] 10/08/2020, 12:40 PM  If 7PM-7AM, please contact night-coverage [email or web address]

## 2020-10-09 ENCOUNTER — Inpatient Hospital Stay (HOSPITAL_COMMUNITY): Payer: 59

## 2020-10-09 MED ORDER — SODIUM CHLORIDE 0.9% FLUSH
10.0000 mL | INTRAVENOUS | Status: DC | PRN
Start: 2020-10-09 — End: 2020-10-10

## 2020-10-09 MED ORDER — LIDOCAINE HCL 1 % IJ SOLN
INTRAMUSCULAR | Status: AC
  Filled 2020-10-09: qty 10

## 2020-10-09 NOTE — Progress Notes (Signed)
Physical Therapy Treatment Patient Details Name: [name] MRN: [id number] DOB: 1986/05/26 Today's Date: 10/09/2020    History of Present Illness DEMTRIUS Barker is a 35 y.o. male with medical history significant of AVN of hip ( originally was scheduled for a THA this coming March2022), HTN, GERD. Recent admission for COVID , acute renal failure and alcoholic hepatitis (08/22/20- 08/29/20). He was ok for a few days after discharge, but he began to feel very tired, weak, left thigh and leg pain and progressed over the last 2 weeks. He reports that the left thigh had become increasingly swollen and tender to touch. He has had a blister rupture on the inner thigh in the groin area. These symptoms increased through 2 days ago when he was no longer ago to walk without great pain. 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 Left thigh I & D and wound vac placement 3/1. s/p I&D groin abcess by general surgery 09/23/19. Additional drain placed L thigh per IR 09/23/20.  On 09/27/2020 he underwent I&D of the left thigh again with partial closure and surgery did an I&D of the left groin abscess.  S/P paracentesis 09/30/20.  IR plans for pulling of hip/retroperitoneal drain, aspirating a small persistent fluid collection in the left iliacus, aspiration/drainage of proximal lateral thigh collection, continuing remaining drains on bulb suction.    PT Comments    Pt continues very motivated to progress.  Pt requiring increased assist to stand from recliner (vs elevated bed) and ambulated limited distance in room to return to bed for Picc line placement.  Follow Up Recommendations  CIR      Equipment Recommendations  Rolling walker with 5" wheels;3in1 (PT)    Recommendations for Other Services Rehab consult     Precautions / Restrictions Precautions Precautions: Fall;Posterior Hip Precaution Comments: Pt able to recall all THP; multiple lines and drains Restrictions Weight Bearing Restrictions: No LLE Weight Bearing: Weight bearing as tolerated Other Position/Activity Restrictions: 3 JP drains in Lt anterior thigh and wound vac    Mobility  Bed Mobility Overal bed mobility: Needs Assistance Bed Mobility: Sit to Supine     Supine to sit: Min assist;+2 for physical assistance;+2 for safety/equipment Sit to supine: Mod assist   General bed mobility comments: Increased time with cues for sequence and use of R LE to self assist.  Physical assist to manage LEs    Transfers Overall transfer level: Needs assistance Equipment used: Rolling walker (2 wheeled) Transfers: Sit to/from Stand Sit to Stand: Mod assist;+2 physical assistance;+2 safety/equipment         General transfer comment: cues for LE management and use of UEs to self assist; Increased physical assist to bring pt up from Recliner  Ambulation/Gait Ambulation/Gait assistance: Min assist;+2 safety/equipment Gait Distance (Feet): 5 Feet Assistive device: Rolling walker (2 wheeled) Gait Pattern/deviations: Step-to pattern;Decreased stride length;Shuffle;Antalgic Gait velocity: decr   General Gait Details: cues for sequence, posture and position from [employer]             Wheelchair Mobility    Modified Rankin (Stroke Patients Only)       Balance Overall balance assessment: Needs assistance Sitting-balance support: Feet supported Sitting balance-Leahy Scale: Fair  Standing balance support: Bilateral upper extremity supported Standing balance-Leahy Scale: Poor                              Cognition Arousal/Alertness: Awake/alert Behavior During Therapy: WFL for  tasks assessed/performed Overall Cognitive Status: Within Functional Limits for tasks assessed                                        Exercises Total Joint Exercises Ankle Circles/Pumps: AROM;Both;Supine;15 reps Quad Sets: AROM;Both;10 reps;Supine Gluteal Sets: AROM;Both;5 reps Heel Slides: AAROM;Both;Supine;20 reps Hip ABduction/ADduction: AAROM;Left;15 reps;Supine    General Comments        Pertinent Vitals/Pain Pain Assessment: 0-10 Pain Score: 6  Pain Location: L LE Pain Descriptors / Indicators: Grimacing;Sore Pain Intervention(s): Limited activity within patient's tolerance;Monitored during session;Premedicated before session    Home Living                      Prior Function            PT Goals (current goals can now be found in the care plan section) Acute Rehab PT Goals Patient Stated Goal: to be able to walk again and more independent PT Goal Formulation: With patient Time For Goal Achievement: 10/14/20 Potential to Achieve Goals: Good Progress towards PT goals: Progressing toward goals    Frequency    Min 4X/week      PT Plan Current plan remains appropriate    Co-evaluation              AM-PAC PT "6 Clicks" Mobility   Outcome Measure  Help needed turning from your back to your side while in a flat bed without using bedrails?: A Lot Help needed moving from lying on your back to sitting on the side of a flat bed without using bedrails?: A Lot Help needed moving to and from a bed to a chair (including a wheelchair)?: A Lot Help needed standing up from a chair using your arms (e.g., wheelchair or bedside chair)?: A Lot Help needed to walk in hospital room?: A Little Help needed climbing 3-5 steps with a railing? : Total 6 Click Score: 12    End of Session Equipment Utilized During Treatment: Gait belt Activity Tolerance: Patient tolerated treatment well;Patient limited by fatigue Patient left: in bed;with call  bell/phone within reach;with bed alarm set Nurse Communication: Mobility status PT Visit Diagnosis: Other abnormalities of gait and mobility (R26.89);Muscle weakness (generalized) (M62.81);Pain Pain - Right/Left: Left Pain - part of body: Hip     Time: 1202-1225 PT Time Calculation (min) (ACUTE ONLY): 23 min  Charges:  $Gait Training: 8-22 mins $Therapeutic Exercise: 8-22 mins $Therapeutic Activity: 8-22 mins                     Mauro Kaufmann PT Acute Rehabilitation Services Pager [phone] Office [phone]    Marvette Schamp 10/09/2020, 12:40 PM

## 2020-10-09 NOTE — Progress Notes (Signed)
Physical Therapy Treatment Patient Details Name: [name] MRN: [id number] DOB: 1986/06/04 Today's Date: 10/09/2020    History of Present Illness [name] is a 35 y.o. male with medical history significant of AVN of hip ( originally was scheduled for a THA this coming March2022), HTN, GERD. Recent admission for COVID , acute renal failure and alcoholic hepatitis (08/22/20- 08/29/20). He was ok for a few days after discharge, but he began to feel very tired, weak, left thigh and leg pain and progressed over the last 2 weeks. He reports that the left thigh had become increasingly swollen and tender to touch. He has had a blister rupture on the inner thigh in the groin area. These symptoms increased through 2 days ago when he was no longer ago to walk without great pain. 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 Left thigh I & D and wound vac placement 3/1. s/p I&D groin abcess by general surgery 09/23/19. Additional drain placed L thigh per IR 09/23/20.  On 09/27/2020 he underwent I&D of the left thigh again with partial closure and surgery did an I&D of the left groin abscess.  S/P paracentesis 09/30/20.  IR plans for pulling of hip/retroperitoneal drain, aspirating a small persistent fluid collection in the left iliacus, aspiration/drainage of proximal lateral thigh collection, continuing remaining drains on bulb suction.    PT Comments    Pt very motivated this date and with marked improvement in activity tolerance.  Pt up to ambulate into hallway for first time!!   Follow Up Recommendations  CIR     Equipment Recommendations  Rolling walker with 5" wheels;3in1  (PT)    Recommendations for Other Services Rehab consult     Precautions / Restrictions Precautions Precautions: Fall;Posterior Hip Precaution Comments: Pt able to recall all THP; multiple lines and drains Restrictions Weight Bearing Restrictions: No LLE Weight Bearing: Weight bearing as tolerated Other Position/Activity Restrictions: 3 JP drains in Lt anterior thigh and wound vac    Mobility  Bed Mobility Overal bed mobility: Needs Assistance Bed Mobility: Supine to Sit     Supine to sit: Min assist;+2 for physical assistance;+2 for safety/equipment     General bed mobility comments: Increased time with cues for sequence and use of R LE to self assist.  Physical assist to manage L LE and to bring trunk to upright    Transfers Overall transfer level: Needs assistance Equipment used: Rolling walker (2 wheeled) Transfers: Sit to/from Stand Sit to Stand: Min assist;+2 physical assistance;+2 safety/equipment;From elevated surface         General transfer comment: cues for LE management and use of UEs to self assist  Ambulation/Gait Ambulation/Gait assistance: Min assist;+2 safety/equipment Gait Distance (Feet): 18 Feet Assistive device: Rolling walker (2 wheeled) Gait Pattern/deviations: Step-to pattern;Decreased stride length;Shuffle;Antalgic Gait velocity: decr   General Gait Details: cues for sequence, posture and position from [employer]             Wheelchair Mobility    Modified Rankin (Stroke Patients Only)       Balance Overall balance assessment: Needs assistance Sitting-balance support: Feet supported Sitting balance-Leahy Scale: Fair     Standing balance support: Bilateral upper extremity supported Standing balance-Leahy  Scale: Poor                              Cognition Arousal/Alertness: Awake/alert Behavior During Therapy: WFL for tasks assessed/performed Overall Cognitive Status: Within Functional Limits for tasks  assessed                                        Exercises Total Joint Exercises Ankle Circles/Pumps: AROM;Both;Supine;15 reps Quad Sets: AROM;Both;10 reps;Supine Gluteal Sets: AROM;Both;5 reps Heel Slides: AAROM;Both;Supine;20 reps Hip ABduction/ADduction: AAROM;Left;15 reps;Supine    General Comments        Pertinent Vitals/Pain Pain Assessment: 0-10 Pain Score: 5  Pain Location: L LE Pain Descriptors / Indicators: Grimacing;Sore Pain Intervention(s): Limited activity within patient's tolerance;Monitored during session;Premedicated before session    Home Living                      Prior Function            PT Goals (current goals can now be found in the care plan section) Acute Rehab PT Goals Patient Stated Goal: to be able to walk again and more independent PT Goal Formulation: With patient Time For Goal Achievement: 10/14/20 Potential to Achieve Goals: Good Progress towards PT goals: Progressing toward goals    Frequency    Min 4X/week      PT Plan Current plan remains appropriate    Co-evaluation              AM-PAC PT "6 Clicks" Mobility   Outcome Measure  Help needed turning from your back to your side while in a flat bed without using bedrails?: A Lot Help needed moving from lying on your back to sitting on the side of a flat bed without using bedrails?: A Lot Help needed moving to and from a bed to a chair (including a wheelchair)?: A Lot Help needed standing up from a chair using your arms (e.g., wheelchair or bedside chair)?: A Lot Help needed to walk in hospital room?: A Little Help needed climbing 3-5 steps with a railing? : Total 6 Click Score: 12    End of Session Equipment Utilized During Treatment: Gait belt Activity Tolerance: Patient tolerated treatment well Patient left: in chair;with call bell/phone within reach Nurse Communication: Mobility status PT Visit Diagnosis: Other abnormalities of gait and  mobility (R26.89);Muscle weakness (generalized) (M62.81);Pain Pain - Right/Left: Left Pain - part of body: Hip     Time: 0925-1000 PT Time Calculation (min) (ACUTE ONLY): 35 min  Charges:  $Gait Training: 8-22 mins $Therapeutic Exercise: 8-22 mins                     Mauro Kaufmann PT Acute Rehabilitation Services Pager [phone] Office [phone]    Shonta Bourque 10/09/2020, 10:28 AM

## 2020-10-09 NOTE — Progress Notes (Signed)
Spoke with primary nurse re: PICC placement. Patient is working with PT at this time. Will check back again later. RN will enter IV team consult when patient back in bed.

## 2020-10-09 NOTE — Progress Notes (Signed)
PROGRESS NOTE    [name]  WVP:[id number] DOB: 1985-09-28 DOA: 09/13/2020 PCP: [name], PA   Brief Narrative: [name] is a 35 y.o. male with a history of AVN of the hip, hypertension, GERD, alcoholic hepatitis. Patient presented secondary to left leg pain, found to have anemia and multiple leg/gluteal/iliospoas abscesses requiring multiple I&Ds. Complicated by septic shock and MSSA bacteremia in addition to cirrhosis with associated anasarca and ascites.   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 Transthoracic Echocardiogram and Transesophageal Echocardiogram obtained and were without evidence of vegetations. ID consulted with recommendations for Cafazolin/Flagyl/rifampin as mentioned below  Iliopsoas/left gluteal/groin abscesses Septic arthritis History of avascular necrosis Orthopedic surgery, IR and ID consulted. Patient underwent multiple I&Ds in addition to left total hip arthroplasty. Three drains in place including wound vac. Antibiotic coverage as mentioned above  -ID recommendations: Cefazolin, Flagyl and rifampin IV until April 8th with reevaluation at that time -IR recommendations for drain management -Orthopedic surgery recommendations for wound vac management -PICC line placement is pending  Septic shock Secondary to above. Lactic acid peak of 5.3. Managed with Levophed vasopressor support and  IV fluid resuscitation. Required ICU admission for 48 hours. Resolved.  Hypervolemic hyponatremia Secondary to fluid overload from fluid resuscitation from septic shock in addition to underlying  cirrhosis/hypoalbuminemia and poor fluid balance. Nadir of 124. In/out measurements suggest net negative for admission, however patient's weight is up 26 lbs. Weight pending today Sodium up to 127 today with goal likely between 127-130. -Switch to Lasix 40 mg daily in AM -Daily BMP  Multifocal pneumonia Seen on CT scan on 3/11. No symptoms currently. On room air. Currently on antibiotic coverage for above infections.  Chronic pain -Continue MS contin BID  Symptomatic anemia Macrocytic anemia No active bleeding noted. Patient received a total of 6 units of PRBC. Hemoglobin currently stable.  Hepatic cirrhosis with ascites Anasarca History of alcoholic hepatitis GI consulted. Patient managed with intermittent paracenteses in addition to lasix and spironolactone. GI recommending EGD to screen for esophageal varices which can be done as an outpatient. Max weight of 213.85 kg; down to 85.4 kg today. Paracentesis last performed on 3/16 -Repeat Paracentesis  Scrotal edema Urology consulted. No concern for infection. In setting of anasarca as mentioned above but possibly with some reactive change from adjacent infections. No evidence of abscess or local skin infection. Improving. Urology recommending to continue foley until able to use urinal. Overall, edema is significantly improved -Continue urinary foley catheter until edema improves enough for him to urinate effectively on his own  Thrombocytopenia Likely related to acute infection. He received a total of 1 unit of platelets in addition to 1 unit of FFP. Resolved.  Sinus tachycardia Outpatient problem. Patient followed by cardiology. Managed on metoprolol.  Primary hypertension Blood pressure well controlled -Continue metoprolol  GAD -Continue Zoloft  GERD -Continue Protonix  Metabolic acidosis Resolved  Hypophosphatemia Resolved.  Hypomagnesemia Replete as needed.   DVT prophylaxis: SCDs Code Status:   Code Status:  Full Code Family Communication: None at bedside Disposition Plan: Discharge to CIR when bed available. Sodium improved. Confirmed from  orthopedic surgery and IR that patient is appropriate for CIR   Consultants:   General surgery  Orthopedic surgery  Infectious disease  Urology  Gastroenterology  Nephrology  Procedures:   Multiple I&D  Left hip total hip arthroplasty  Antimicrobials:  Cefazolin  Flagyl  Rifampin  Cefepime  Vancomycin   Subjective: No concerns today. Abdomen is swelling up again.  Objective: Vitals:   10/08/20 0521 10/08/20 0905 10/08/20 2111 10/09/20 0558  BP: 109/69 110/71 122/80 112/72  Pulse: 100 (!) 104 (!) 110 (!) 101  Resp: 17 16 16 16   Temp: 99.1 F (37.3 C) 98.2 F (36.8 C) 99 F (37.2 C) 98.3 F (36.8 C)  TempSrc: Oral Oral Oral Oral  SpO2: 91% 92% 94% 92%  Weight:      Height:        Intake/Output Summary (Last 24 hours) at 10/09/2020 1117 Last data filed at 10/09/2020 1000 Gross per 24 hour  Intake 1135.9 ml  Output 1438 ml  Net -302.1 ml   Filed Weights   10/05/20 0500 10/06/20 1758 10/07/20 0500  Weight: 97 kg 83.2 kg 85.4 kg    Examination:  General exam: Appears calm and comfortable Respiratory system: Clear to auscultation. Respiratory effort normal. Cardiovascular system: S1 & S2 heard, RRR. No murmurs, rubs, gallops or clicks. Gastrointestinal system: Abdomen is distended, soft and nontender. No organomegaly or masses felt. Normal bowel sounds heard. Central nervous system: Alert and oriented. No focal neurological deficits. Musculoskeletal: generalized edema. No calf tenderness Skin: No cyanosis. No rashes Psychiatry: Judgement and insight appear normal. Mood & affect appropriate.    Data Reviewed: I have personally reviewed following labs and imaging studies  CBC Lab Results  Component Value Date   WBC 14.0 (H) 10/07/2020   RBC 2.63 (L) 10/07/2020   HGB 8.6 (L) 10/07/2020   HCT 27.1 (L)  10/07/2020   MCV 103.0 (H) 10/07/2020   MCH 32.7 10/07/2020   PLT 279 10/07/2020   MCHC 31.7 10/07/2020   RDW 18.4 (H) 10/07/2020   LYMPHSABS 2.6 10/05/2020   MONOABS 1.0 10/05/2020   EOSABS 0.3 10/05/2020   BASOSABS 0.0 10/05/2020     Last metabolic panel Lab Results  Component Value Date   NA 130 (L) 10/08/2020   K 3.6 10/08/2020   CL 98 10/08/2020   CO2 23 10/08/2020   BUN 12 10/08/2020   CREATININE 0.31 (L) 10/08/2020   GLUCOSE 86 10/08/2020   GFRNONAA >60 10/08/2020   GFRAA >60 02/24/2019   CALCIUM 7.7 (L) 10/08/2020   PHOS 4.3 10/05/2020   PROT 5.1 (L) 10/08/2020   ALBUMIN 1.7 (L) 10/08/2020   BILITOT 0.7 10/08/2020   ALKPHOS 140 (H) 10/08/2020   AST 26 10/08/2020   ALT 6 10/08/2020   ANIONGAP 9 10/08/2020    CBG (last 3)  No results for input(s): GLUCAP in the last 72 hours.   GFR: Estimated Creatinine Clearance: 131.8 mL/min (A) (by C-G formula based on SCr of 0.31 mg/dL (L)).  Coagulation Profile: No results for input(s): INR, PROTIME in the last 168 hours.  Recent Results (from the past 240 hour(s))  Body fluid culture w Gram Stain     Status: None   Collection Time: 09/30/20 12:55 PM   Specimen: Peritoneal Washings; Body Fluid  Result Value Ref Range Status   Specimen Description   Final    PERITONEAL Performed at [facility], [address], [place], [place]    Special Requests   Final  Normal Performed at [facility], [address] [place], [place] [address]    Gram Stain NO WBC SEEN NO ORGANISMS SEEN   Final   Culture   Final    NO GROWTH 3 DAYS Performed at [facility], [address] [place], [place] [address]    Report Status 10/04/2020 FINAL  Final  Aerobic/Anaerobic Culture (surgical/deep wound)     Status: None   Collection Time: 09/30/20  4:56 PM   Specimen: Abscess  Result Value Ref Range Status   Specimen Description   Final    ABSCESS THIGH Performed at [facility], [address] [place], [place] [address]    Special Requests   Final    NONE Performed at [facility], [address] [place], [place] [address]    Gram Stain   Final    ABUNDANT WBC PRESENT,BOTH PMN AND MONONUCLEAR ABUNDANT GRAM POSITIVE COCCI IN PAIRS    Culture   Final    RARE STAPHYLOCOCCUS AUREUS NO ANAEROBES ISOLATED Performed at [facility], [address] [place], [place] [address]    Report Status 10/05/2020 FINAL  Final   Organism ID, Bacteria STAPHYLOCOCCUS AUREUS  Final      Susceptibility   Staphylococcus aureus - MIC*    CIPROFLOXACIN <=0.5 SENSITIVE Sensitive     ERYTHROMYCIN <=0.25 SENSITIVE Sensitive     GENTAMICIN <=0.5 SENSITIVE Sensitive     OXACILLIN <=0.25 SENSITIVE Sensitive     TETRACYCLINE <=1 SENSITIVE Sensitive     VANCOMYCIN 1 SENSITIVE Sensitive     TRIMETH/SULFA <=10 SENSITIVE Sensitive     CLINDAMYCIN <=0.25 SENSITIVE Sensitive     RIFAMPIN >=32 RESISTANT Resistant     Inducible Clindamycin NEGATIVE Sensitive     * RARE STAPHYLOCOCCUS AUREUS  Culture, blood (routine x 2)     Status: None   Collection Time: 10/01/20  2:29 PM   Specimen: BLOOD RIGHT HAND  Result Value Ref Range Status   Specimen Description   Final    BLOOD RIGHT HAND Performed at [facility], [address] [place], [place] [address]    Special Requests   Final    BOTTLES DRAWN AEROBIC ONLY Blood Culture adequate volume Performed at [facility], [address] [place], [place] [address]    Culture   Final    NO GROWTH 5 DAYS Performed at [facility], [address] [place], [place] [address]    Report Status 10/06/2020 FINAL  Final  Culture, blood (routine x 2)     Status: None   Collection Time: 10/01/20  2:29 PM   Specimen: BLOOD RIGHT FOREARM  Result Value Ref Range Status   Specimen Description   Final    BLOOD RIGHT FOREARM Performed at [facility], [address] [place], [place] [address]    Special Requests   Final    BOTTLES DRAWN AEROBIC ONLY Blood Culture adequate volume Performed at [facility], [address] [place], [place] [address]    Culture   Final    NO GROWTH 5 DAYS Performed at [facility], [address] [place], [place] [address]    Report Status 10/06/2020 FINAL  Final        Radiology Studies: US EKG SITE RITE  Result Date: 10/08/2020 If Site Rite image not attached, placement could not be confirmed due to current cardiac rhythm.  Scheduled Meds: . (feeding supplement) PROSource Plus  30 mL Oral BID BM  . Chlorhexidine Gluconate Cloth  6 each Topical Daily  . docusate sodium  100 mg Oral BID  . feeding supplement  1 Container Oral BID BM  . folic acid  1 mg Oral Daily  . furosemide  40 mg Oral BID  . guaiFENesin  1,200 mg Oral BID  . hydrocortisone cream   Topical BID  . lactulose  10 g Oral BID  . mouth rinse  15 mL Mouth Rinse BID  . metoprolol tartrate  50 mg Oral BID  . metroNIDAZOLE  500 mg Oral Q8H  . morphine  15 mg Oral Q12H  . multivitamin with minerals  1 tablet Oral Daily  . nutrition supplement (JUVEN)  1 packet Oral BID BM  . pantoprazole  40 mg Oral BID  . rifampin  300 mg Oral Q12H  . senna  1 tablet Oral BID  . sertraline  50 mg Oral Daily  . sodium chloride flush  5 mL Intracatheter Q8H  . sodium chloride flush  5 mL Intracatheter Q8H  . spironolactone  100 mg Oral Daily  . thiamine  100 mg Oral Daily   Continuous Infusions: .  ceFAZolin (ANCEF) IV Stopped (10/09/20 0551)     LOS: 26 days     Jacquelin Hawking, MD [facility] 10/09/2020, 11:17 AM  If 7PM-7AM, please contact night-coverage [email or web address]

## 2020-10-09 NOTE — Progress Notes (Signed)
Peripherally Inserted Central Catheter Placement  The IV Nurse has discussed with the patient and/or persons authorized to consent for the patient, the purpose of this procedure and the potential benefits and risks involved with this procedure.  The benefits include less needle sticks, lab draws from the catheter, and the patient may be discharged home with the catheter. Risks include, but not limited to, infection, bleeding, blood clot (thrombus formation), and puncture of an artery; nerve damage and irregular heartbeat and possibility to perform a PICC exchange if needed/ordered by physician.  Alternatives to this procedure were also discussed.  Bard Power PICC patient education guide, fact sheet on infection prevention and patient information card has been provided to patient /or left at bedside.    PICC Placement Documentation  PICC Single Lumen 10/09/20 PICC Right Basilic 40 cm 1 cm (Active)  Indication for Insertion or Continuance of Line Prolonged intravenous therapies 10/09/20 1320  Exposed Catheter (cm) 1 cm 10/09/20 1320  Site Assessment Clean;Dry;Intact 10/09/20 1320  Line Status Flushed;Blood return noted 10/09/20 1320  Dressing Type Transparent 10/09/20 1320  Dressing Status Clean;Dry;Intact 10/09/20 1320  Antimicrobial disc in place? Yes 10/09/20 1320  Dressing Intervention New dressing;Other (Comment) 10/09/20 1320  Dressing Change Due 10/16/20 10/09/20 1320       Reginia Forts Albarece 10/09/2020, 1:21 PM

## 2020-10-10 ENCOUNTER — Inpatient Hospital Stay (HOSPITAL_COMMUNITY)
Admission: RE | Admit: 2020-10-10 | Discharge: 2020-10-28 | DRG: 560 | Disposition: A | Discharge status: Home Health | Payer: 59 | Source: Intra-hospital | Attending: Physical Medicine & Rehabilitation | Admitting: Physical Medicine & Rehabilitation

## 2020-10-10 ENCOUNTER — Other Ambulatory Visit: Payer: Self-pay

## 2020-10-10 DIAGNOSIS — F32A Depression, unspecified: Secondary | ICD-10-CM | POA: Diagnosis present

## 2020-10-10 DIAGNOSIS — R188 Other ascites: Secondary | ICD-10-CM

## 2020-10-10 DIAGNOSIS — L02416 Cutaneous abscess of left lower limb: Secondary | ICD-10-CM | POA: Diagnosis present

## 2020-10-10 DIAGNOSIS — E8809 Other disorders of plasma-protein metabolism, not elsewhere classified: Secondary | ICD-10-CM | POA: Diagnosis present

## 2020-10-10 DIAGNOSIS — R5381 Other malaise: Secondary | ICD-10-CM

## 2020-10-10 DIAGNOSIS — T502X5A Adverse effect of carbonic-anhydrase inhibitors, benzothiadiazides and other diuretics, initial encounter: Secondary | ICD-10-CM | POA: Diagnosis not present

## 2020-10-10 DIAGNOSIS — I10 Essential (primary) hypertension: Secondary | ICD-10-CM

## 2020-10-10 DIAGNOSIS — Z96642 Presence of left artificial hip joint: Secondary | ICD-10-CM

## 2020-10-10 DIAGNOSIS — M6009 Infective myositis, multiple sites: Secondary | ICD-10-CM | POA: Diagnosis present

## 2020-10-10 DIAGNOSIS — Z471 Aftercare following joint replacement surgery: Principal | ICD-10-CM

## 2020-10-10 DIAGNOSIS — F17290 Nicotine dependence, other tobacco product, uncomplicated: Secondary | ICD-10-CM | POA: Diagnosis present

## 2020-10-10 DIAGNOSIS — K7031 Alcoholic cirrhosis of liver with ascites: Secondary | ICD-10-CM | POA: Diagnosis present

## 2020-10-10 DIAGNOSIS — A419 Sepsis, unspecified organism: Secondary | ICD-10-CM

## 2020-10-10 DIAGNOSIS — E871 Hypo-osmolality and hyponatremia: Secondary | ICD-10-CM

## 2020-10-10 DIAGNOSIS — D649 Anemia, unspecified: Secondary | ICD-10-CM | POA: Diagnosis present

## 2020-10-10 DIAGNOSIS — L02214 Cutaneous abscess of groin: Secondary | ICD-10-CM | POA: Diagnosis present

## 2020-10-10 DIAGNOSIS — E876 Hypokalemia: Secondary | ICD-10-CM | POA: Diagnosis not present

## 2020-10-10 DIAGNOSIS — D696 Thrombocytopenia, unspecified: Secondary | ICD-10-CM | POA: Diagnosis present

## 2020-10-10 DIAGNOSIS — K7460 Unspecified cirrhosis of liver: Secondary | ICD-10-CM | POA: Diagnosis present

## 2020-10-10 DIAGNOSIS — R6521 Severe sepsis with septic shock: Secondary | ICD-10-CM

## 2020-10-10 DIAGNOSIS — Z79899 Other long term (current) drug therapy: Secondary | ICD-10-CM

## 2020-10-10 DIAGNOSIS — R609 Edema, unspecified: Secondary | ICD-10-CM

## 2020-10-10 DIAGNOSIS — M8700 Idiopathic aseptic necrosis of unspecified bone: Secondary | ICD-10-CM

## 2020-10-10 DIAGNOSIS — N5089 Other specified disorders of the male genital organs: Secondary | ICD-10-CM

## 2020-10-10 LAB — CBC
HCT: 26.9 % — ABNORMAL LOW (ref 39.0–52.0)
Hemoglobin: 8.6 g/dL — ABNORMAL LOW (ref 13.0–17.0)
MCH: 32.7 pg (ref 26.0–34.0)
MCHC: 32 g/dL (ref 30.0–36.0)
MCV: 102.3 fL — ABNORMAL HIGH (ref 80.0–100.0)
Platelets: 277 10*3/uL (ref 150–400)
RBC: 2.63 MIL/uL — ABNORMAL LOW (ref 4.22–5.81)
RDW: 17.6 % — ABNORMAL HIGH (ref 11.5–15.5)
WBC: 12.4 10*3/uL — ABNORMAL HIGH (ref 4.0–10.5)
nRBC: 0 % (ref 0.0–0.2)

## 2020-10-10 MED ORDER — JUVEN PO PACK: 1.0000 | PACK | Freq: Two times a day (BID) | ORAL | 0 refills | Status: DC

## 2020-10-10 MED ORDER — HYDROCORTISONE 1 % EX CREA: TOPICAL_CREAM | Freq: Two times a day (BID) | CUTANEOUS | 0 refills | Status: DC

## 2020-10-10 MED ORDER — PROSOURCE PLUS PO LIQD
30.0000 mL | Freq: Two times a day (BID) | ORAL | Status: DC
  Administered 2020-10-10 – 2020-10-28 (×33): 30 mL via ORAL
  Filled 2020-10-10 (×33): qty 30

## 2020-10-10 MED ORDER — SPIRONOLACTONE 25 MG PO TABS
100.0000 mg | ORAL_TABLET | Freq: Every day | ORAL | Status: DC
  Administered 2020-10-11 – 2020-10-28 (×18): 100 mg via ORAL
  Filled 2020-10-10 (×20): qty 4

## 2020-10-10 MED ORDER — MORPHINE SULFATE 15 MG PO TABS
15.0000 mg | ORAL_TABLET | Freq: Four times a day (QID) | ORAL | Status: DC | PRN
  Administered 2020-10-11 – 2020-10-18 (×12): 15 mg via ORAL
  Filled 2020-10-10 (×12): qty 1

## 2020-10-10 MED ORDER — DOCUSATE SODIUM 100 MG PO CAPS: 100.0000 mg | ORAL_CAPSULE | Freq: Two times a day (BID) | ORAL | Status: DC

## 2020-10-10 MED ORDER — METOCLOPRAMIDE HCL 5 MG PO TABS: 5.0000 mg | ORAL_TABLET | Freq: Three times a day (TID) | ORAL | Status: DC | PRN

## 2020-10-10 MED ORDER — ADULT MULTIVITAMIN W/MINERALS CH
1.0000 | ORAL_TABLET | Freq: Every day | ORAL | Status: DC
  Administered 2020-10-11 – 2020-10-28 (×18): 1 via ORAL
  Filled 2020-10-10 (×18): qty 1

## 2020-10-10 MED ORDER — JUVEN PO PACK
1.0000 | PACK | Freq: Two times a day (BID) | ORAL | Status: DC
  Administered 2020-10-10 – 2020-10-27 (×31): 1 via ORAL
  Filled 2020-10-10 (×35): qty 1

## 2020-10-10 MED ORDER — METOCLOPRAMIDE HCL 5 MG/ML IJ SOLN: 5.0000 mg | Freq: Three times a day (TID) | INTRAMUSCULAR | Status: DC | PRN

## 2020-10-10 MED ORDER — SERTRALINE HCL 50 MG PO TABS: 50.0000 mg | ORAL_TABLET | Freq: Every day | ORAL | Status: DC

## 2020-10-10 MED ORDER — METRONIDAZOLE 500 MG PO TABS
500.0000 mg | ORAL_TABLET | Freq: Three times a day (TID) | ORAL | Status: DC
  Administered 2020-10-10 – 2020-10-28 (×53): 500 mg via ORAL
  Filled 2020-10-10 (×57): qty 1

## 2020-10-10 MED ORDER — FUROSEMIDE 40 MG PO TABS
40.0000 mg | ORAL_TABLET | Freq: Every day | ORAL | Status: DC
  Administered 2020-10-11 – 2020-10-28 (×18): 40 mg via ORAL
  Filled 2020-10-10 (×18): qty 1

## 2020-10-10 MED ORDER — MORPHINE SULFATE ER 15 MG PO TBCR
15.0000 mg | EXTENDED_RELEASE_TABLET | Freq: Two times a day (BID) | ORAL | Status: DC
  Administered 2020-10-10 – 2020-10-24 (×28): 15 mg via ORAL
  Filled 2020-10-10 (×28): qty 1

## 2020-10-10 MED ORDER — MORPHINE SULFATE ER 15 MG PO TBCR: 15.0000 mg | EXTENDED_RELEASE_TABLET | Freq: Two times a day (BID) | ORAL | 0 refills | Status: DC

## 2020-10-10 MED ORDER — CEFAZOLIN SODIUM-DEXTROSE 2-4 GM/100ML-% IV SOLN
2.0000 g | Freq: Three times a day (TID) | INTRAVENOUS | Status: DC
  Administered 2020-10-10 – 2020-10-28 (×53): 2 g via INTRAVENOUS
  Filled 2020-10-10 (×55): qty 100

## 2020-10-10 MED ORDER — ONDANSETRON HCL 4 MG/2ML IJ SOLN: 4.0000 mg | Freq: Four times a day (QID) | INTRAMUSCULAR | Status: DC | PRN

## 2020-10-10 MED ORDER — ACETAMINOPHEN 650 MG RE SUPP: 650.0000 mg | Freq: Four times a day (QID) | RECTAL | Status: DC | PRN

## 2020-10-10 MED ORDER — RIFAMPIN 300 MG PO CAPS
300.0000 mg | ORAL_CAPSULE | Freq: Two times a day (BID) | ORAL | Status: DC
  Administered 2020-10-10 – 2020-10-28 (×36): 300 mg via ORAL
  Filled 2020-10-10 (×38): qty 1

## 2020-10-10 MED ORDER — SERTRALINE HCL 50 MG PO TABS
50.0000 mg | ORAL_TABLET | Freq: Every day | ORAL | Status: DC
  Administered 2020-10-11 – 2020-10-28 (×18): 50 mg via ORAL
  Filled 2020-10-10 (×18): qty 1

## 2020-10-10 MED ORDER — RIFAMPIN 300 MG PO CAPS: 300.0000 mg | ORAL_CAPSULE | Freq: Two times a day (BID) | ORAL | Status: DC

## 2020-10-10 MED ORDER — CEFAZOLIN SODIUM-DEXTROSE 2-4 GM/100ML-% IV SOLN: 2.0000 g | Freq: Three times a day (TID) | INTRAVENOUS | Status: DC

## 2020-10-10 MED ORDER — SENNA 8.6 MG PO TABS
1.0000 | ORAL_TABLET | Freq: Two times a day (BID) | ORAL | Status: DC
  Administered 2020-10-11 – 2020-10-27 (×11): 8.6 mg via ORAL
  Filled 2020-10-10 (×31): qty 1

## 2020-10-10 MED ORDER — FOLIC ACID 1 MG PO TABS
1.0000 mg | ORAL_TABLET | Freq: Every day | ORAL | Status: DC
  Administered 2020-10-11 – 2020-10-28 (×18): 1 mg via ORAL
  Filled 2020-10-10 (×18): qty 1

## 2020-10-10 MED ORDER — GUAIFENESIN ER 600 MG PO TB12
1200.0000 mg | ORAL_TABLET | Freq: Two times a day (BID) | ORAL | Status: DC
  Administered 2020-10-10 – 2020-10-28 (×36): 1200 mg via ORAL
  Filled 2020-10-10 (×38): qty 2

## 2020-10-10 MED ORDER — ACETAMINOPHEN 325 MG PO TABS
650.0000 mg | ORAL_TABLET | Freq: Four times a day (QID) | ORAL | Status: DC | PRN
  Administered 2020-10-15 – 2020-10-25 (×3): 650 mg via ORAL
  Filled 2020-10-10 (×3): qty 2

## 2020-10-10 MED ORDER — PROSOURCE PLUS PO LIQD: 30.0000 mL | Freq: Two times a day (BID) | ORAL | Status: DC

## 2020-10-10 MED ORDER — POLYETHYLENE GLYCOL 3350 17 G PO PACK: 17.0000 g | PACK | Freq: Every day | ORAL | 0 refills | Status: DC | PRN

## 2020-10-10 MED ORDER — POLYETHYLENE GLYCOL 3350 17 G PO PACK: 17.0000 g | PACK | Freq: Every day | ORAL | Status: DC | PRN

## 2020-10-10 MED ORDER — IPRATROPIUM-ALBUTEROL 0.5-2.5 (3) MG/3ML IN SOLN: 3.0000 mL | RESPIRATORY_TRACT | Status: DC | PRN

## 2020-10-10 MED ORDER — SPIRONOLACTONE 100 MG PO TABS: 100.0000 mg | ORAL_TABLET | Freq: Every day | ORAL | Status: DC

## 2020-10-10 MED ORDER — ONDANSETRON HCL 4 MG PO TABS: 4.0000 mg | ORAL_TABLET | Freq: Four times a day (QID) | ORAL | Status: DC | PRN

## 2020-10-10 MED ORDER — BOOST / RESOURCE BREEZE PO LIQD CUSTOM
1.0000 | Freq: Two times a day (BID) | ORAL | Status: DC
  Administered 2020-10-14 – 2020-10-27 (×17): 1 via ORAL

## 2020-10-10 MED ORDER — DOCUSATE SODIUM 100 MG PO CAPS
100.0000 mg | ORAL_CAPSULE | Freq: Two times a day (BID) | ORAL | Status: DC
  Administered 2020-10-10 – 2020-10-28 (×35): 100 mg via ORAL
  Filled 2020-10-10 (×37): qty 1

## 2020-10-10 MED ORDER — PANTOPRAZOLE SODIUM 40 MG PO TBEC
40.0000 mg | DELAYED_RELEASE_TABLET | Freq: Two times a day (BID) | ORAL | Status: DC
  Administered 2020-10-10 – 2020-10-28 (×36): 40 mg via ORAL
  Filled 2020-10-10 (×36): qty 1

## 2020-10-10 MED ORDER — METOPROLOL TARTRATE 50 MG PO TABS: 50.0000 mg | ORAL_TABLET | Freq: Two times a day (BID) | ORAL | Status: DC

## 2020-10-10 MED ORDER — ADULT MULTIVITAMIN W/MINERALS CH: 1.0000 | ORAL_TABLET | Freq: Every day | ORAL | Status: AC

## 2020-10-10 MED ORDER — METOPROLOL TARTRATE 50 MG PO TABS
50.0000 mg | ORAL_TABLET | Freq: Two times a day (BID) | ORAL | Status: DC
  Administered 2020-10-10 – 2020-10-28 (×36): 50 mg via ORAL
  Filled 2020-10-10 (×36): qty 1

## 2020-10-10 MED ORDER — PANTOPRAZOLE SODIUM 40 MG PO TBEC: 40.0000 mg | DELAYED_RELEASE_TABLET | Freq: Every day | ORAL | Status: DC

## 2020-10-10 MED ORDER — LACTULOSE 10 GM/15ML PO SOLN
10.0000 g | Freq: Two times a day (BID) | ORAL | Status: DC
  Administered 2020-10-10 – 2020-10-28 (×36): 10 g via ORAL
  Filled 2020-10-10 (×37): qty 15

## 2020-10-10 MED ORDER — THIAMINE HCL 100 MG PO TABS
100.0000 mg | ORAL_TABLET | Freq: Every day | ORAL | Status: DC
  Administered 2020-10-11 – 2020-10-28 (×18): 100 mg via ORAL
  Filled 2020-10-10 (×19): qty 1

## 2020-10-10 MED ORDER — HYDROCORTISONE 1 % EX CREA
1.0000 "application " | TOPICAL_CREAM | Freq: Two times a day (BID) | CUTANEOUS | Status: DC
  Administered 2020-10-10 – 2020-10-27 (×19): 1 via TOPICAL
  Filled 2020-10-10: qty 28

## 2020-10-10 MED ORDER — SENNA 8.6 MG PO TABS: 1.0000 | ORAL_TABLET | Freq: Two times a day (BID) | ORAL | 0 refills | Status: DC

## 2020-10-10 MED ORDER — FUROSEMIDE 40 MG PO TABS
40.0000 mg | ORAL_TABLET | Freq: Every day | ORAL | Status: DC
  Administered 2020-10-10: 40 mg via ORAL
  Filled 2020-10-10: qty 1

## 2020-10-10 MED ORDER — METRONIDAZOLE 500 MG PO TABS: 500.0000 mg | ORAL_TABLET | Freq: Three times a day (TID) | ORAL | Status: DC

## 2020-10-10 MED ORDER — FUROSEMIDE 40 MG PO TABS: 40.0000 mg | ORAL_TABLET | Freq: Every day | ORAL | Status: DC

## 2020-10-10 NOTE — Discharge Instructions (Signed)
[name],  You were in the hospital with a bad infection in your thigh/buttock region in addition to your blood. You required antibiotics and surgical procedures to improve your infection. Your stay was complicated by an ICU admission, in addition to liver disease and edema. Please follow-up with your specialists as directed.

## 2020-10-10 NOTE — Progress Notes (Addendum)
Subjective:  Patient alert and oriented. States pain improved this morning. Happy he was able to walk the halls yesterday. Still feels uncomfortable putting full weight on left leg when walking. No other complaints.   Objective:  PE: VITALS:   Vitals:   10/09/20 2139 10/10/20 0217 10/10/20 0528 10/10/20 0912  BP: 113/73 100/68 102/68 108/75  Pulse: (!) 109 93 98 (!) 102  Resp: 18 18 18    Temp: 98.5 F (36.9 C) 98.5 F (36.9 C) 98.1 F (36.7 C) 97.7 F (36.5 C)  TempSrc: Oral Oral Oral Oral  SpO2: 93% 94% 95% 98%  Weight:      Height:       General: Laying down in bed, in no acute distress MSK: left hip incision, still draining serous fluid but otherwise still well opposed. Left lateral thigh edematous. Left leg incision with wound vac intact and approx100cc's of serous fluid in canister. Left groin with packing and ABD's, C/D/I. Non tender left calf. Sensation intact to all aspects of left foot.No edema of left foot+ DP pulse.Dorsiflexion/Plantar flexionintact.  LABS  Results for orders placed or performed during the hospital encounter of 09/13/20 (from the past 24 hour(s))  CBC     Status: Abnormal   Collection Time: 10/10/20  9:20 AM  Result Value Ref Range   WBC 12.4 (H) 4.0 - 10.5 K/uL   RBC 2.63 (L) 4.22 - 5.81 MIL/uL   Hemoglobin 8.6 (L) 13.0 - 17.0 g/dL   HCT 10/12/20 (L) 08.6 - 76.1 %   MCV 102.3 (H) 80.0 - 100.0 fL   MCH 32.7 26.0 - 34.0 pg   MCHC 32.0 30.0 - 36.0 g/dL   RDW 95.0 (H) 93.2 - 67.1 %   Platelets 277 150 - 400 K/uL   nRBC 0.0 0.0 - 0.2 %    24.5 ASCITES (ABDOMEN LIMITED)  Result Date: 10/09/2020 CLINICAL DATA:  History of alcoholic cirrhosis, recurrent symptomatic abdominal ascites, post paracentesis most recently 10/05/2020. EXAM: LIMITED ABDOMEN ULTRASOUND FOR ASCITES TECHNIQUE: Limited ultrasound survey for ascites was performed in all four abdominal quadrants. COMPARISON:  10/05/2020 FINDINGS: Small volume perihepatic and scattered  abdominal ascites. No dominant pocket for safe therapeutic paracentesis. IMPRESSION: Small volume ascites.  Paracentesis deferred. Electronically Signed   By: 10/07/2020 M.D.   On: 10/09/2020 14:36   10/11/2020 EKG SITE RITE  Result Date: 10/08/2020 If Site Rite image not attached, placement could not be confirmed due to current cardiac rhythm.   Assessment/Plan: Lefthipseptic arthritis/AVN,multiple left thigh and retroperitonealabscesses - 2/23 - placement of drain into left retroperitoneal abscess and two into left thigh by IR - 2/24 -left hip total arthroplasty with cement interposition spacer - 3/1 - left thigh irrigation and debridement with wound vac placementby orthopedics, removal of left retroperitoneal drain - 3/3 - I&D of left groin abscess with change of wound vac by general surgery 3/4 - CT guided drainage of left thigh fluid collection by IR - 3/8 - left thigh repeat irrigation and debridement by orthopedics with partial closure of wound, incision and drainage of left thigh abscess/left groin abscessby general surgery 3/11 - Removal ofleftpsoas drain, CT guided aspiration ofleftiliacus abscess, CT guided drain placement of left anterior thigh abscess by IR   - WBATLLE, continue OT and PT as able - improvement over the weekend as patient was able to walk in the hall for the first time in weeks -WBC trending down from 14.0 on the 18th to 12.4 today - continue BID dressing  changes to left hip woundwith gauze and ABD's - still drainage serous fluid - patient was not premedicated this morning. Since patient is being moved, will plan to take out staples at CIR later this week.  - plan to continue wound vac on left thigh - found to have approx 100 cc's of serous drainage in canister this morning, with continued MWF dressing changes per WOC  - will defer DVT prophylaxis choice to primary given coagulopathy, patient is 25 days out from total hip arthroplasty - ok to discharge to  CIR when bed available from orthopedic standpoint, will [facility]   Contact information:   Weekdays 8-5 Janine Ores, PA-C [phone] A fter hours and holidays please check [email or web address] for group call information for [facility]  Armida Sans 10/10/2020, 11:48 AM

## 2020-10-10 NOTE — Progress Notes (Signed)
Signed        PMR Admission Coordinator Pre-Admission Assessment   Patient: [name] is an 35 y.o., male MRN: [id number] DOB: 10-04-1985 Height: 5' 10"  (177.8 cm) Weight: 85.4 kg   Insurance Information HMO:     PPO:      PCP:      IPA:      80/20:      OTHER:  PRIMARY: [facility]      Policy#: [id number]      Subscriber: Pt CM Name:      Phone#:      Fax#: [phone] Pre-Cert#: [id number] Received approval 10/08/20. Approved for 9 days with updates due 10/17/20      Employer:  Benefits:  Phone #: [phone]     Name:  Eff. Date: 02/21/20-11/19/20     Deduct: $3,800 ($0 met)      Out of Pocket Max: $6,950 ($0 met)      Life Max: NA CIR: 60% coverage, 40% co-insurance      SNF: 60% coverage, 40% co-insurance Outpatient: 60% coverage 40%    Co-Pay: 40% co-insurance Home Health: 60% coverage      Co-Pay: 40% co-insurance DME: 60% coverage     Co-Pay: 40% for rental only Providers: in-network SECONDARY: none       Policy#:      Phone#:    Development worker, community:       Phone#:    The Engineer, petroleum" for patients in Inpatient Rehabilitation Facilities with attached "Privacy Act [place] Records" was provided and verbally reviewed with: N/A   Emergency Contact Information         Contact Information     Name Relation Home Work Mobile    [place] Mother Salvo    [place] Other [phone]        Silver Huguenin Daughter     [place], Jolly Significant other     [phone]         Current Medical History  Patient Admitting Diagnosis: Debility, THA,  History of Present Illness: [name] is a 35 y.o. male with medical history significant of AVN of hip ( originally was scheduled for a THA this coming March 2022), HTN, GERD. Recent admission for COVID , acute renal failure and alcoholic hepatitis (2/99/37- 08/29/20). He was ok for a few days after discharge, but he began to feel very tired,  weak, left thigh and leg pain and progressed over the last 2 weeks. He reports that the left thigh had become increasingly swollen and tender to touch. He has had a blister rupture on the inner thigh in the groin area. These symptoms increased through 2 days PTA when he was no longer ago to walk without great pain. He presented to [facility] 09/13/20 and was found to have a Hgb of 6.2, MSSA bacteremia complicated by iliopsoas/gluteal/thigh abscess. IR drain placement x3 on 09/14/2020. Pt. Also found to have  and left hip septic arthritis with osteomyelitis, and underwent total hip arthroplasty and antibiotic spacer plus excisional debridement of skin, subcutaneous tissue, muscle, bone, and deep abscess with orthopedic surgery 09/15/2020. Left thigh I & D and wound vac placement 3/1. CIR was consulted to assist in return to PLOF.         Patient's medical record from [facility] has been reviewed by the rehabilitation admission coordinator and physician.   Past Medical History      Past Medical  History:  Diagnosis Date  . Alcohol abuse    . Asthma    . Depression    . Hypertension        Family History   family history is not on file.   Prior Rehab/Hospitalizations Has the patient had prior rehab or hospitalizations prior to admission? Yes   Has the patient had major surgery during 100 days prior to admission? Yes              Current Medications   Current Facility-Administered Medications:  .  (feeding supplement) PROSource Plus liquid 30 mL, 30 mL, Oral, BID BM, Brown, Blaine K, PA-C, 30 mL at 10/10/20 0858 .  acetaminophen (TYLENOL) tablet 650 mg, 650 mg, Oral, Q6H PRN, 650 mg at 10/08/20 1548 **OR** acetaminophen (TYLENOL) suppository 650 mg, 650 mg, Rectal, Q6H PRN, Owens Shark, Blaine K, PA-C .  ceFAZolin (ANCEF) IVPB 2g/100 mL premix, 2 g, Intravenous, Q8H, Carlyle Basques, MD, Last Rate: 200 mL/hr at 10/10/20 0541, 2 g at 10/10/20 0541 .  Chlorhexidine Gluconate  Cloth 2 % PADS 6 each, 6 each, Topical, Daily, Ventura Bruns, PA-C, 6 each at 10/10/20 0859 .  docusate sodium (COLACE) capsule 100 mg, 100 mg, Oral, BID, Merlene Pulling K, PA-C, 100 mg at 10/10/20 0859 .  feeding supplement (BOOST / RESOURCE BREEZE) liquid 1 Container, 1 Container, Oral, BID BM, Ventura Bruns, PA-C, 1 Container at 10/09/20 1322 .  folic acid (FOLVITE) tablet 1 mg, 1 mg, Oral, Daily, Merlene Pulling K, PA-C, 1 mg at 10/10/20 6962 .  furosemide (LASIX) tablet 40 mg, 40 mg, Oral, Daily, Mariel Aloe, MD, 40 mg at 10/10/20 0858 .  guaiFENesin (MUCINEX) 12 hr tablet 1,200 mg, 1,200 mg, Oral, BID, Raiford Noble [place], DO, 1,200 mg at 10/10/20 9528 .  hydrocortisone cream 1 %, , Topical, BID, Raiford Noble [place], [place], Given at 10/06/20 2200 .  HYDROmorphone (DILAUDID) injection 1 mg, 1 mg, Intravenous, BID PRN, Jill Alexanders, PA-C, 1 mg at 10/10/20 0548 .  ipratropium-albuterol (DUONEB) 0.5-2.5 (3) MG/3ML nebulizer solution 3 mL, 3 mL, Nebulization, Q4H PRN, Sheikh, Omair Latif, DO .  lactulose (CHRONULAC) 10 GM/15ML solution 10 g, 10 g, Oral, BID, Merlene Pulling K, PA-C, 10 g at 10/10/20 0857 .  MEDLINE mouth rinse, 15 mL, Mouth Rinse, BID, Merlene Pulling K, PA-C, 15 mL at 10/10/20 0859 .  metoCLOPramide (REGLAN) tablet 5-10 mg, 5-10 mg, Oral, Q8H PRN **OR** metoCLOPramide (REGLAN) injection 5-10 mg, 5-10 mg, Intravenous, Q8H PRN, Owens Shark, Blaine K, PA-C .  metoprolol tartrate (LOPRESSOR) tablet 50 mg, 50 mg, Oral, BID, Raiford Noble [place], DO, 50 mg at 10/10/20 4132 .  metroNIDAZOLE (FLAGYL) tablet 500 mg, 500 mg, Oral, Q8H, Carlyle Basques, MD, 500 mg at 10/10/20 0539 .  morphine (MS CONTIN) 12 hr tablet 15 mg, 15 mg, Oral, Q12H, Ventura Bruns, PA-C, 15 mg at 10/10/20 0858 .  morphine (MSIR) tablet 15 mg, 15 mg, Oral, Q6H PRN, Mariel Aloe, MD, 15 mg at 10/09/20 1316 .  multivitamin with minerals tablet 1 tablet, 1 tablet, Oral, Daily, Ventura Bruns, PA-C, 1 tablet at  10/10/20 4401 .  nutrition supplement (JUVEN) (JUVEN) powder packet 1 packet, 1 packet, Oral, BID BM, Ventura Bruns, PA-C, 1 packet at 10/09/20 1317 .  ondansetron (ZOFRAN) tablet 4 mg, 4 mg, Oral, Q6H PRN **OR** ondansetron (ZOFRAN) injection 4 mg, 4 mg, Intravenous, Q6H PRN, Merlene Pulling K, PA-C, 4 mg at 09/27/20 1056 .  pantoprazole (PROTONIX) EC  tablet 40 mg, 40 mg, Oral, BID, Ventura Bruns, PA-C, 40 mg at 10/10/20 2863 .  polyethylene glycol (MIRALAX / GLYCOLAX) packet 17 g, 17 g, Oral, Daily PRN, Owens Shark, Blaine K, PA-C .  rifampin (RIFADIN) capsule 300 mg, 300 mg, Oral, Q12H, Carlyle Basques, MD, 300 mg at 10/10/20 0857 .  senna (SENOKOT) tablet 8.6 mg, 1 tablet, Oral, BID, Merlene Pulling K, PA-C, 8.6 mg at 10/09/20 1022 .  sertraline (ZOLOFT) tablet 50 mg, 50 mg, Oral, Daily, Raiford Noble [place], DO, 50 mg at 10/10/20 8177 .  sodium chloride flush (NS) 0.9 % injection 10-40 mL, 10-40 mL, Intracatheter, PRN, Marchia Bond, MD .  sodium chloride flush (NS) 0.9 % injection 5 mL, 5 mL, Intracatheter, Q8H, Brown, Blaine K, PA-C, 5 mL at 10/10/20 0542 .  sodium chloride flush (NS) 0.9 % injection 5 mL, 5 mL, Intracatheter, Q8H, Brown, Blaine K, PA-C, 5 mL at 10/10/20 0542 .  spironolactone (ALDACTONE) tablet 100 mg, 100 mg, Oral, Daily, [place], Amy S, PA-C, 100 mg at 10/10/20 0858 .  thiamine tablet 100 mg, 100 mg, Oral, Daily, Ventura Bruns, PA-C, 100 mg at 10/10/20 1165   Patients Current Diet:     Diet Order                      Diet 2 gram sodium Room service appropriate? Yes; Fluid consistency: Thin  Diet effective now                      Precautions / Restrictions Precautions Precautions: Fall,Posterior Hip Precaution Comments: Pt able to recall all THP; multiple lines and drains Restrictions Weight Bearing Restrictions: No LLE Weight Bearing: Weight bearing as tolerated Other Position/Activity Restrictions: 3 JP drains in Lt anterior thigh and wound vac    Has the  patient had 2 or more falls or a fall with injury in the past year? No   Prior Activity Level Community (5-7x/wk): Pt. was working and active in the community prior to [place] admission   Prior Functional Level Self Care: Did the patient need help bathing, dressing, using the toilet or eating? Needed some help   Indoor Mobility: Did the patient need assistance with walking from room to room (with or without device)? Needed some help   Stairs: Did the patient need assistance with internal or external stairs (with or without device)? Needed some help   Functional Cognition: Did the patient need help planning regular tasks such as shopping or remembering to take medications? Needed some help   Home Assistive Devices / [place] Devices/Equipment: Crutches Home Equipment: Cane - single point,Crutches,Walker - 2 wheels,Bedside commode   Prior Device Use: Indicate devices/aids used by the patient prior to current illness, exacerbation or injury? Walker   Current Functional Level Cognition   Overall Cognitive Status: Within Functional Limits for tasks assessed Orientation Level: Oriented X4 General Comments: AxO x 3    Extremity Assessment (includes Sensation/Coordination)   Upper Extremity Assessment: Overall WFL for tasks assessed  Lower Extremity Assessment: Defer to PT evaluation RLE Deficits / Details: likes to rest LE in ER and flexed, however can straighten R LE for full range , some weakness and atrophy noted ( LLE greater than Right) , generally 4/5 LLE Deficits / Details: very limited with pain. did full ROM with ankle and gentle knee bends and full extension with assistance. did not test stregth or against gravity due to pain.  ADLs   Overall ADL's : Needs assistance/impaired Eating/Feeding: Independent Grooming: Oral care,Wash/dry face,Wash/dry hands,Sitting,Set up Grooming Details (indicate cue type and reason): Pt sat EOB and performed all grooming with  setup only. Upper Body Bathing: Set up,Sitting Lower Body Bathing: +2 for physical assistance,Maximal assistance,+2 for safety/equipment,Sit to/from stand Upper Body Dressing : Set up,Bed level Lower Body Dressing: Total assistance,+2 for physical assistance,+2 for safety/equipment,Sit to/from stand Lower Body Dressing Details (indicate cue type and reason): Total assist to don socks. Left edema improved and changed to smaller sock. R foot still edematous Toilet Transfer: Minimal assistance,+2 for physical assistance,+2 for safety/equipment Toilet Transfer Details (indicate cue type and reason): from elevated bed height and use of stedy worked on standing tolerance and global strengthening necessary to safely complete functional transfers. Patient able to power up to standing with min A x2, improved mobility from previous session Toileting- Clothing Manipulation and Hygiene: Total assistance,+2 for physical assistance,+2 for safety/equipment,Sit to/from stand Toileting - Clothing Manipulation Details (indicate cue type and reason): Total assist for pericare with use of steady and +2 assistance Functional mobility during ADLs: Moderate assistance,Maximal assistance,Rolling walker General ADL Comments: Pt. deferred EOB or OOB secondary to pain. Pt. states they did more sx on groin yesterday.     Mobility   Overal bed mobility: Needs Assistance Bed Mobility: Sit to Supine Supine to sit: Min assist,+2 for physical assistance,+2 for safety/equipment Sit to supine: Mod assist General bed mobility comments: Increased time with cues for sequence and use of R LE to self assist.  Physical assist to manage LEs     Transfers   Overall transfer level: Needs assistance Equipment used: Rolling walker (2 wheeled) Transfer via Lift Equipment: Stedy Transfers: Sit to/from Stand Sit to Stand: Mod assist,+2 physical assistance,+2 safety/equipment Stand pivot transfers: Mod assist,Max assist,+2 physical  assistance,+2 safety/equipment General transfer comment: cues for LE management and use of UEs to self assist; Increased physical assist to bring pt up from Recliner     Ambulation / Gait / Stairs / Wheelchair Mobility   Ambulation/Gait Ambulation/Gait assistance: Min assist,+2 safety/equipment Gait Distance (Feet): 5 Feet Assistive device: Rolling walker (2 wheeled) Gait Pattern/deviations: Step-to pattern,Decreased stride length,Shuffle,Antalgic General Gait Details: cues for sequence, posture and position from RW Gait velocity: decr     Posture / Balance Dynamic Sitting Balance Sitting balance - Comments: Using arms to position him self more posteriorly however able to lift arms off of bed and maintain sitting balance Balance Overall balance assessment: Needs assistance Sitting-balance support: Feet supported Sitting balance-Leahy Scale: Fair Sitting balance - Comments: Using arms to position him self more posteriorly however able to lift arms off of bed and maintain sitting balance Postural control: Right lateral lean,Posterior lean Standing balance support: Bilateral upper extremity supported Standing balance-Leahy Scale: Poor Standing balance comment: patient able to maintain brief static stand without UE support in stedy     Special needs/care consideration Wound Vac Left thigh, Skin Drains-Left medial thigh; Left proximal thigh; Left hip; Blister- left groin; Catheter entry/exit- Left leg; Eccyhmosis-arm, hand, leg/bilateral; Petechia-anterior abdomen; Rash-abdomen/left, anterior; Weeping- left hip and Designated Visitor-Linda, girlfriend, Laird Runnion, mother    Previous Home Environment (from acute therapy documentation) Living Arrangements: Spouse/significant other Available Help at Discharge: Family Type of Home: House Home Layout: One level Home Access: Stairs to enter [employer] of Steps: 2 in front, 5 in back, but has a ramp in place in front now [employer]  Shower/Tub: Multimedia programmer: Handicapped height Bathroom Accessibility: Yes How  Accessible: Accessible via walker Home Care Services: No   Discharge Living Setting Plans for Discharge Living Setting: Patient's home Type of Home at Discharge: House Discharge Home Layout: One level Discharge Home Access: Stairs to enter Entrance Stairs-Rails: [place] reach both Entrance Stairs-Number of Steps: 2 Discharge Bathroom Shower/Tub: Walk-in shower Discharge Bathroom Toilet: Handicapped height Discharge Bathroom Accessibility: Yes How Accessible: Accessible via walker Does the patient have any problems obtaining your medications?: No   Social/Family/Support Systems Patient Roles: Partner Contact Information: [phone] Anticipated Caregiver: Ileene Rubens (girlfriend), works from 10am-3 pm, is reaching out to family to see if they can stay with pt. While she works.  Anticipated Caregiver's Contact Information: [phone] Ability/Limitations of Caregiver: Can provide min-mod A Caregiver Availability: Evenings only Discharge Plan Discussed with Primary Caregiver: Yes Is Caregiver In Agreement with Plan?: Yes   Goals Patient/Family Goal for Rehab: PT/OT min A Expected length of stay: 18-21 days Pt/Family Agrees to Admission and willing to participate: Yes Program Orientation Provided & Reviewed with Pt/Caregiver Including Roles  & Responsibilities: Yes   Decrease burden of Care through IP rehab admission: Specialzed equipment needs, Decrease number of caregivers, Bowel and bladder program and Patient/family education   Possible need for SNF placement upon discharge: not anticipated   Patient Condition: I have reviewed medical records from [facility], spoken with CM, and patient. I met with patient at the bedside for inpatient rehabilitation assessment.  Patient will benefit from ongoing PT and OT, can actively participate in 3 hours of therapy a day 5 days  of the week, and can make measurable gains during the admission.  Patient will also benefit from the coordinated team approach during an Inpatient Acute Rehabilitation admission.  The patient will receive intensive therapy as well as Rehabilitation physician, nursing, social worker, and care management interventions.  Due to safety, skin/wound care, disease management, medication administration and pain management the patient requires 24 hour a day rehabilitation nursing.  The patient is currently Min A+2-Mod A+2 with mobility and min A +2-Total A with basic ADLs.  Discharge setting and therapy post discharge at home with home health is anticipated.  Patient has agreed to participate in the Acute Inpatient Rehabilitation Program and will admit today.   Preadmission Screen Completed By: Genella Mech with day of admission updates by Bethel Born, 10/10/2020 10:55 AM ______________________________________________________________________   Discussed status with Dr. Ranell Patrick on 10/10/20  at 10:55 AM and received approval for admission today.   Admission Coordinator: Genella Mech, CCC-SLP with date of admission updates by Bethel Born, CCC-SLP, time 10:55 AM/Date 10/10/20     Assessment/Plan: Diagnosis: MSSA bacteremia 1. Does the need for close, 24 hr/day Medical supervision in concert with the patient's rehab needs make it unreasonable for this patient to be served in a less intensive setting? Yes 2. Co-Morbidities requiring supervision/potential complications:  1. Overweight BMI 27.01 2. Alcohol dependence 3. Alcohol hepatitis 4. Sepsis 5. Septic arthritis 3. Due to bladder management, bowel management, safety, skin/wound care, disease management, medication administration, pain management and patient education, does the patient require 24 hr/day rehab nursing? Yes 4. Does the patient require coordinated care of a physician, rehab nurse, PT, OT to address physical and  functional deficits in the context of the above medical diagnosis(es)? Yes Addressing deficits in the following areas: balance, endurance, locomotion, strength, transferring, bowel/bladder control, bathing, dressing, feeding, grooming, toileting and psychosocial support 5. Can the patient actively participate in an intensive therapy program of at  least 3 hrs of therapy 5 days a week? Yes 6. The potential for patient to make measurable gains while on inpatient rehab is excellent 7. Anticipated functional outcomes upon discharge from inpatient rehab: modified independent PT, modified independent OT, independent SLP 8. Estimated rehab length of stay to reach the above functional goals is: 10-14 days 9. Anticipated discharge destination: Home 10. Overall Rehab/Functional Prognosis: excellent     MD Signature: Leeroy Cha, MD

## 2020-10-10 NOTE — H&P (Signed)
Physical Medicine and Rehabilitation Admission H&P  CC: Left hip septic arthritis  HPI: [name] is a 35 year old right-handed male with history significant of AVN of hip and had a scheduled plan for arthroplasty March 2022, chronic pain, hypertension, alcoholic hepatitis/cirrhosis with ascites with  as well as tobacco use and depression.  Recent admission for Covid, acute renal failure and alcoholic hepatitis 07/25/2020 08/29/2020.  Per chart review patient lives with spouse.  1 level home 2 steps to entry.  Independent with assistive device.  Presented 09/13/2020 with increased fatigue that progressed over 2-week.  As well as increasing left thigh and leg pain.  He did report he had a blister rupture on the inner thigh in the groin area.  Admission chemistry sodium 128, glucose 112, creatinine 0.59, lactic acid 3.3.  Hemoglobin 6.2 with latest hemoglobin 9.4 08/29/2020, blood culture Staph aureus.  FOBT negative.  CT imaging showed left iliopsoas and left gluteal abscesses with diffuse swelling and left hip effusion.  Patient started on broad-spectrum antibiotics.  Patient underwent left total hip arthroplasty with antibiotic spacer left hip excisional debridement of skin subcutaneous tissue muscle bone deep abscess 09/15/2020 per Dr. Dion SaucierLandau.  Infectious disease consulted as patient remained on antibiotic therapy TEE completed showing no PFO without masses or vegetation noted.  He later underwent left thigh excisional debridement of skin muscle fascia deep tissue abscess repositioning of percutaneous anterior pigtail catheter drain application of wound VAC 09/20/2020.  Hospital course complicated by left groin abscess with I&D of left groin abscess and change of wound VAC to left thigh 09/22/2020 per general surgery Dr. Luisa Hartornett followed by repeat I&D of left groin abscess and left thigh abscess 09/27/2020.Marland Kitchen.  Lower extremity Dopplers completed 09/28/2020 showing no DVT.  Underwent ultrasound-guided diagnostic  and therapeutic paracentesis 09/30/2020 as well as 10/05/2020 per interventional radiology yielding 3 L of clear light yellow fluid.  He did have some initial difficulties in voiding Foley catheter tube inserted ultrasound completed concerning for soft tissue infection urology follow-up Dr. Laverle PatterBorden evaluation appeared to be related to more regional edema from his leg and ankle infection.  There was no abscess noted on recent ultrasound.  Patient would remain on cefazolin, Flagyl and rifampin through 10/28/2020 per infectious disease.  Therapy evaluations completed due to patient decreased functional mobility was admitted for a comprehensive rehab program.  Review of Systems  Constitutional: Positive for malaise/fatigue. Negative for chills and fever.  Eyes: Negative for blurred vision and double vision.  Respiratory: Negative for cough and shortness of breath.   Cardiovascular: Positive for leg swelling. Negative for chest pain.  Gastrointestinal: Negative for heartburn, nausea and vomiting.  Genitourinary: Negative for dysuria, flank pain, hematuria and urgency.  Musculoskeletal: Positive for joint pain and myalgias.  Skin: Negative for rash.  Neurological: Positive for weakness.  Psychiatric/Behavioral: Positive for depression. The patient has insomnia.   All other systems reviewed and are negative.  Past Medical History:  Diagnosis Date  . Alcohol abuse   . Asthma   . Depression   . Hypertension    Past Surgical History:  Procedure Laterality Date  . BUBBLE STUDY  09/19/2020   Procedure: BUBBLE STUDY;  Surgeon: Pricilla Riffleoss, Paula V, MD;  [facility];  Service: Cardiovascular;;  . FRACTURE SURGERY    . I & D EXTREMITY Left 09/20/2020   Procedure: IRRIGATION AND DEBRIDEMENT EXTREMITY LEFT LEG ABCESS;  Surgeon: Teryl LucyLandau, Joshua, MD;  Location: [facility] ORS;  Service: Orthopedics;  Laterality: Left;  . I &  D EXTREMITY Left 09/27/2020   Procedure: IRRIGATION AND DEBRIDEMENT LEFT THIGH WITH WOUND  VAC  APPLICATION;  Surgeon: Teryl Lucy, MD;  Location: [facility] ORS;  Service: Orthopedics;  Laterality: Left;  . INCISION AND DRAINAGE OF WOUND Left 09/27/2020   Procedure: IRRIGATION AND DEBRIDEMENT LEFT GROIN;  Surgeon: Luretha Murphy, MD;  Location: [facility] ORS;  Service: General;  Laterality: Left;  . IRRIGATION AND DEBRIDEMENT ABSCESS Left 09/22/2020   Procedure: IRRIGATION AND DEBRIDEMENT LEFT THIGH ABSCESS;  Surgeon: Harriette Bouillon, MD;  Location: [facility] ORS;  Service: General;  Laterality: Left;  DOW ROOM STARTING AT 09:00AM FOR 60 MIN  . TEE WITHOUT CARDIOVERSION N/A 09/19/2020   Procedure: TRANSESOPHAGEAL ECHOCARDIOGRAM (TEE);  Surgeon: Pricilla Riffle, MD;  Location: [facility] ENDOSCOPY;  Service: Cardiovascular;  Laterality: N/A;  . TOTAL HIP ARTHROPLASTY Left 09/15/2020   Procedure: TOTAL HIP ARTHROPLASTY WITH CEMENT INTERPOSITION SPACING;  Surgeon: Teryl Lucy, MD;  Location: [facility] ORS;  Service: Orthopedics;  Laterality: Left;   Family History  Problem Relation Age of Onset  . Cirrhosis Neg Hx   . Diabetes Mellitus II Neg Hx    Social History:  reports that he has been smoking cigars. He has never used smokeless tobacco. He reports current alcohol use of about 12.0 - 18.0 standard drinks of alcohol per week. He reports current drug use. Drug: Marijuana. Allergies: No Active Allergies Medications Prior to Admission  Medication Sig Dispense Refill  . ceFAZolin (ANCEF) 2-4 GM/100ML-% IVPB Inject 100 mLs (2 g total) into the vein every 8 (eight) hours for 19 days. 1 each   . docusate sodium (COLACE) 100 MG capsule Take 1 capsule (100 mg total) by mouth 2 (two) times daily.    . folic acid (FOLVITE) 1 MG tablet Take 1 tablet (1 mg total) by mouth daily. 100 tablet 0  . [START ON 10/11/2020] furosemide (LASIX) 40 MG tablet Take 1 tablet (40 mg total) by mouth daily.    . hydrocortisone cream 1 % Apply topically 2 (two) times daily. 30 g 0  . lactulose (CHRONULAC) 10 GM/15ML solution Take 15 mLs (10 g total) by  mouth 2 (two) times daily. 236 mL 0  . metoprolol tartrate (LOPRESSOR) 50 MG tablet Take 1 tablet (50 mg total) by mouth 2 (two) times daily.    . metroNIDAZOLE (FLAGYL) 500 MG tablet Take 1 tablet (500 mg total) by mouth every 8 (eight) hours for 19 days.    Marland Kitchen morphine (MS CONTIN) 15 MG 12 hr tablet Take 1 tablet (15 mg total) by mouth every 12 (twelve) hours.  0  . [START ON 10/11/2020] Multiple Vitamin (MULTIVITAMIN WITH MINERALS) TABS tablet Take 1 tablet by mouth daily.    . nutrition supplement, JUVEN, (JUVEN) PACK Take 1 packet by mouth 2 (two) times daily between meals.  0  . Nutritional Supplements (,FEEDING SUPPLEMENT, PROSOURCE PLUS) liquid Take 30 mLs by mouth 2 (two) times daily between meals.    . pantoprazole (PROTONIX) 40 MG tablet Take 1 tablet (40 mg total) by mouth daily.    . polyethylene glycol (MIRALAX / GLYCOLAX) 17 g packet Take 17 g by mouth daily as needed for moderate constipation. 14 each 0  . rifampin (RIFADIN) 300 MG capsule Take 1 capsule (300 mg total) by mouth every 12 (twelve) hours for 19 days.    Marland Kitchen senna (SENOKOT) 8.6 MG TABS tablet Take 1 tablet (8.6 mg total) by mouth 2 (two) times daily. 120 tablet 0  . [START ON 10/11/2020] sertraline (ZOLOFT)  50 MG tablet Take 1 tablet (50 mg total) by mouth daily.    Melene Muller ON 10/11/2020] spironolactone (ALDACTONE) 100 MG tablet Take 1 tablet (100 mg total) by mouth daily.    Marland Kitchen thiamine 100 MG tablet Take 1 tablet (100 mg total) by mouth daily. 100 tablet 0    Drug Regimen Review Drug regimen was reviewed and remains appropriate with no significant issues identified  Home: Home Living Family/patient expects to be discharged to:: Private residence Living Arrangements: Spouse/significant other Available Help at Discharge: Family Type of Home: House Home Access: Stairs to enter [employer] of Steps: 2 in front, 5 in back, but has a ramp in place in front now Home Layout: One level Bathroom Shower/Tub:  Health visitor: Handicapped height Bathroom Accessibility: Yes Home Equipment: Cane - single point,Crutches,Walker - 2 wheels,Bedside commode   Functional History: Prior Function Level of Independence: Independent with assistive device(s),Needs assistance Gait / Transfers Assistance Needed: since this started backin Jan he has had more and more difficultyw ith ambulation trying to use RW, but pain with the infection became so great he was having great amount of trouble walking. Prior to that he was working and independent. Comments: using cane, then progressed to crutches recently due to hip pain  Functional Status:  Mobility: Bed Mobility Overal bed mobility: Needs Assistance Bed Mobility: Sit to Supine Supine to sit: Min assist,+2 for physical assistance,+2 for safety/equipment Sit to supine: Mod assist General bed mobility comments: Increased time with cues for sequence and use of R LE to self assist.  Physical assist to manage LEs Transfers Overall transfer level: Needs assistance Equipment used: Rolling walker (2 wheeled) Transfer via Lift Equipment: Stedy Transfers: Sit to/from Stand Sit to Stand: Mod assist,+2 physical assistance,+2 safety/equipment Stand pivot transfers: Mod assist,Max assist,+2 physical assistance,+2 safety/equipment General transfer comment: cues for LE management and use of UEs to self assist; Increased physical assist to bring pt up from Recliner Ambulation/Gait Ambulation/Gait assistance: Min assist,+2 safety/equipment Gait Distance (Feet): 5 Feet Assistive device: Rolling walker (2 wheeled) Gait Pattern/deviations: Step-to pattern,Decreased stride length,Shuffle,Antalgic General Gait Details: cues for sequence, posture and position from RW Gait velocity: decr  ADL: ADL Overall ADL's : Needs assistance/impaired Eating/Feeding: Independent Grooming: Oral care,Wash/dry face,Wash/dry hands,Sitting,Set up Grooming Details (indicate  cue type and reason): Pt sat EOB and performed all grooming with setup only. Upper Body Bathing: Set up,Sitting Lower Body Bathing: +2 for physical assistance,Maximal assistance,+2 for safety/equipment,Sit to/from stand Upper Body Dressing : Set up,Bed level Lower Body Dressing: Total assistance,+2 for physical assistance,+2 for safety/equipment,Sit to/from stand Lower Body Dressing Details (indicate cue type and reason): Total assist to don socks. Left edema improved and changed to smaller sock. R foot still edematous Toilet Transfer: Minimal assistance,+2 for physical assistance,+2 for safety/equipment Toilet Transfer Details (indicate cue type and reason): from elevated bed height and use of stedy worked on standing tolerance and global strengthening necessary to safely complete functional transfers. Patient able to power up to standing with min A x2, improved mobility from previous session Toileting- Clothing Manipulation and Hygiene: Total assistance,+2 for physical assistance,+2 for safety/equipment,Sit to/from stand Toileting - Clothing Manipulation Details (indicate cue type and reason): Total assist for pericare with use of steady and +2 assistance Functional mobility during ADLs: Moderate assistance,Maximal assistance,Rolling walker General ADL Comments: Pt. deferred EOB or OOB secondary to pain. Pt. states they did more sx on groin yesterday.  Cognition: Cognition Overall Cognitive Status: Within Functional Limits for tasks assessed Orientation Level:  Oriented X4 Cognition Arousal/Alertness: Awake/alert Behavior During Therapy: WFL for tasks assessed/performed Overall Cognitive Status: Within Functional Limits for tasks assessed General Comments: AxO x 3  Physical Exam: Blood pressure 106/67, pulse 99, temperature 98.4 F (36.9 C), temperature source Oral, resp. rate 16, height  (1.753 m), weight 87.7 kg, SpO2 95 %. Physical Exam Gen: no distress, normal appearing HEENT:  oral mucosa pink and moist, NCAT Cardio: Reg rate Chest: normal effort, normal rate of breathing Abd: soft, non-distended Ext: no edema Psych: pleasant, normal affect Skin: Left hip incision groin site dressed with wound VAC in place  GU: Significant scrotal edema with Foley tube in place Neurological:     Comments: Patient is alert in no acute distress oriented x3 and follows commands. 5/5 strength in BUE. 4/5 in RLE and 2/5 in LLE limited by pain   Results for orders placed or performed during the hospital encounter of 09/13/20 (from the past 48 hour(s))  CBC     Status: Abnormal   Collection Time: 10/10/20  9:20 AM  Result Value Ref Range   WBC 12.4 (H) 4.0 - 10.5 K/uL   RBC 2.63 (L) 4.22 - 5.81 MIL/uL   Hemoglobin 8.6 (L) 13.0 - 17.0 g/dL   HCT 54.0 (L) 98.1 - 19.1 %   MCV 102.3 (H) 80.0 - 100.0 fL   MCH 32.7 26.0 - 34.0 pg   MCHC 32.0 30.0 - 36.0 g/dL   RDW 47.8 (H) 29.5 - 62.1 %   Platelets 277 150 - 400 K/uL   nRBC 0.0 0.0 - 0.2 %    Comment: Performed at [facility], [address], [place], [place] [address]   [place] ASCITES (ABDOMEN LIMITED)  Result Date: 10/09/2020 CLINICAL DATA:  History of alcoholic cirrhosis, recurrent symptomatic abdominal ascites, post paracentesis most recently 10/05/2020. EXAM: LIMITED ABDOMEN ULTRASOUND FOR ASCITES TECHNIQUE: Limited ultrasound survey for ascites was performed in all four abdominal quadrants. COMPARISON:  10/05/2020 FINDINGS: Small volume perihepatic and scattered abdominal ascites. No dominant pocket for safe therapeutic paracentesis. IMPRESSION: Small volume ascites.  Paracentesis deferred. Electronically Signed   By: Corlis Leak M.D.   On: 10/09/2020 14:36       Medical Problem List and Plan: 1.  Debility secondary to septic shock/left gluteal/groin abscesses/septic arthritis with history of avascular necrosis left hip  -patient may not shower  -ELOS/Goals: modI 10-14 days 2.  Impaired  mobility: -DVT/anticoagulation: Continue SCDs.  Venous Doppler studies negative  -antiplatelet therapy: N/A 3. Pain Management: Continue MS Contin 15 mg every 12 hours, MSIR 50 mg every 6 hours as needed 4. Depression:Continue Zoloft 50 mg daily.  Provide emotional support  -antipsychotic agents: N/A 5. Neuropsych: This patient is capable of making decisions on his own behalf. 6. Skin/Wound Care: Routine skin checks 7. Fluids/Electrolytes/Nutrition: Routine in and outs with follow-up chemistries 8.  AVN left hip.  Status post left total hip arthroplasty with antibiotic spacer left hip excisional debridement of skin subcutaneous tissue muscle bone deep abscess 09/15/2020 per Dr. Dion Saucier.  Weightbearing as tolerated with posterior hip precautions 9.  Left groin and left hip abscess.  Status post multiple I&D's.  Wound VAC as directed. 10.  ID.  Continue ceftezole 2 g every 8 hours and Flagyl 500 mg every 8 hours and rifampin 300 mg every 12 hours through 10/28/2020 and stop.  Follow-up infectious disease 11.  Hepatic cirrhosis with ascites.  Status post multiple paracentesis.  GI recommends outpatient EGD.  Latest paracentesis 10/05/2020.  Continue  Chronulac 10 mg twice daily 12.  Scrotal edema.  Follow-up urology services Dr. Laverle Patter.  No concern for infection.  Continue Foley tube until edema improved and then plan voiding trial 13.  Anemia/thrombocytopenia.  Follow-up CBC 14.  Hypertension.  Lopressor 50 mg twice daily, Aldactone 100 mg daily, Lasix 40 mg daily.  Monitor with increased mobility 15.  Tobacco abuse.  Counseling  I have personally performed a face to face diagnostic evaluation, including, but not limited to relevant history and physical exam findings, of this patient and developed relevant assessment and plan.  Additionally, I have reviewed and concur with the physician assistant's documentation above.  Horton Chin, MD 10/10/2020   Mcarthur Rossetti Angiulli, PA-C

## 2020-10-10 NOTE — Progress Notes (Signed)
13 Days Post-Op    CC: Fatigue, left leg pain  Subjective: No new issues or complaints.  Objective: Vital signs in last 24 hours: Temp:  [98.1 F (36.7 C)-98.7 F (37.1 C)] 98.1 F (36.7 C) (03/21 0528) Pulse Rate:  [93-109] 98 (03/21 0528) Resp:  [17-18] 18 (03/21 0528) BP: (99-113)/(64-73) 102/68 (03/21 0528) SpO2:  [92 %-95 %] 95 % (03/21 0528) Last BM Date: 10/09/20  Intake/Output from previous day: 03/20 0701 - 03/21 0700 In: 478.4 [P.O.:5; IV Piggyback:473.4] Out: 1650 [Urine:1650] Intake/Output this shift: No intake/output data recorded.  PE: Skin: left groin wounds are clean with beefy red granulation tissue present.  Lab Results:  No results for input(s): WBC, HGB, HCT, PLT in the last 72 hours.  BMET Recent Labs    10/08/20 0450  NA 130*  K 3.6  CL 98  CO2 23  GLUCOSE 86  BUN 12  CREATININE 0.31*  CALCIUM 7.7*   PT/INR No results for input(s): LABPROT, INR in the last 72 hours.  Recent Labs  Lab 10/04/20 0516 10/05/20 0446 10/06/20 0927 10/07/20 0444 10/08/20 0450  AST 33 31 49* 30 26  ALT 7 8 6 8 6   ALKPHOS 153* 148* 151* 149* 140*  BILITOT 0.9 0.9 1.3* 0.9 0.7  PROT 5.1* 5.1* 5.3* 5.4* 5.1*  ALBUMIN 1.9* 1.9* 1.9* 1.9* 1.7*     Lipase     Component Value Date/Time   LIPASE 99 (H) 08/23/2020 1500     Medications: . (feeding supplement) PROSource Plus  30 mL Oral BID BM  . Chlorhexidine Gluconate Cloth  6 each Topical Daily  . docusate sodium  100 mg Oral BID  . feeding supplement  1 Container Oral BID BM  . folic acid  1 mg Oral Daily  . furosemide  40 mg Oral Daily  . guaiFENesin  1,200 mg Oral BID  . hydrocortisone cream   Topical BID  . lactulose  10 g Oral BID  . mouth rinse  15 mL Mouth Rinse BID  . metoprolol tartrate  50 mg Oral BID  . metroNIDAZOLE  500 mg Oral Q8H  . morphine  15 mg Oral Q12H  . multivitamin with minerals  1 tablet Oral Daily  . nutrition supplement (JUVEN)  1 packet Oral BID BM  . pantoprazole   40 mg Oral BID  . rifampin  300 mg Oral Q12H  . senna  1 tablet Oral BID  . sertraline  50 mg Oral Daily  . sodium chloride flush  5 mL Intracatheter Q8H  . sodium chloride flush  5 mL Intracatheter Q8H  . spironolactone  100 mg Oral Daily  . thiamine  100 mg Oral Daily    Assessment/Plan Sinus tachycardia, chronic  Thrombocytopenia - resolved PMH alcoholic hepatitis Hepatic cirrhosis with ascites GAD GERD Anemia - hgb7.8  PMH AVN  Septic shock due to below on admission -now resolved s/p below procedures and IV abx MSSA bacteremia Septic arthritis of the left hip Iliopsoas and left proximal and medial thigh abscesses  09/14/20 Placement 14 F IR perc drain into left RP abscess, two 12 F drains placed in L thigh (Cx - staph) 09/15/20 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 09/17/20 09/20/20 Left thigh irrigation and debridement with wound vac placement, Dr. 11/20/20 Incision and drainage of left groin abscess, wound VAC change left thigh 09/22/2020, Dr. 11/22/2020, POD #18 I&D of left groin abscess and thigh abscess 09/27/2020, Dr. 11/27/2020, POD #13 - Abx  per ID - wounds are clean.  Patient is clear from surgical standpoint for DC to CIR when otherwise stable from ortho/medicine standpoint. -patient will follow up in DOW clinic in our office after CIR if wounds still present.  If these have healed then further general surgery follow up warranted -wounds are stable.  No further general surgery needs at this time.  We will sign off.  Call as needed  FEN -regular diet VTE -none currently ID -flagyl, ancef    LOS: 27 days    Letha Cape 10/10/2020 Please see Amion

## 2020-10-10 NOTE — H&P (Incomplete)
Physical Medicine and Rehabilitation Admission H&P    Chief Complaint  Patient presents with  . Abnormal Lab  : HPI: [name]. Vangilder is a 35 year old right-handed male with history significant of AVN of hip and had a scheduled plan for arthroplasty March 2022, chronic pain, hypertension, alcoholic hepatitis/cirrhosis with ascites with  as well as tobacco use and depression.  Recent admission for Covid, acute renal failure and alcoholic hepatitis 07/25/2020 08/29/2020.  Per chart review patient lives with spouse.  1 level home 2 steps to entry.  Independent with assistive device.  Presented 09/13/2020 with increased fatigue that progressed over 2-week.  As well as increasing left thigh and leg pain.  He did report he had a blister rupture on the inner thigh in the groin area.  Admission chemistry sodium 128, glucose 112, creatinine 0.59, lactic acid 3.3.  Hemoglobin 6.2 with latest hemoglobin 9.4 08/29/2020, blood culture Staph aureus.  FOBT negative.  CT imaging showed left iliopsoas and left gluteal abscesses with diffuse swelling and left hip effusion.  Patient started on broad-spectrum antibiotics.  Patient underwent left total hip arthroplasty with antibiotic spacer left hip excisional debridement of skin subcutaneous tissue muscle bone deep abscess 09/15/2020 per Dr. Dion Saucier.  Infectious disease consulted as patient remained on antibiotic therapy TEE completed showing no PFO without masses or vegetation noted.  He later underwent left thigh excisional debridement of skin muscle fascia deep tissue abscess repositioning of percutaneous anterior pigtail catheter drain application of wound VAC 09/20/2020.  Hospital course complicated by left groin abscess with I&D of left groin abscess and change of wound VAC to left thigh 09/22/2020 per general surgery Dr. Luisa Hart followed by repeat I&D of left groin abscess and left thigh abscess 09/27/2020.Marland Kitchen  Lower extremity Dopplers completed 09/28/2020 showing no DVT.   Underwent ultrasound-guided diagnostic and therapeutic paracentesis 09/30/2020 as well as 10/05/2020 per interventional radiology yielding 3 L of clear light yellow fluid.  He did have some initial difficulties in voiding Foley catheter tube inserted ultrasound completed concerning for soft tissue infection urology follow-up Dr. Laverle Patter evaluation appeared to be related to more regional edema from his leg and ankle infection.  There was no abscess noted on recent ultrasound.  Patient would remain on cefazolin, Flagyl and rifampin through 10/28/2020 per infectious disease.  Therapy evaluations completed due to patient decreased functional mobility was admitted for a comprehensive rehab program.  Review of Systems  Constitutional: Positive for malaise/fatigue. Negative for chills and fever.  Eyes: Negative for blurred vision and double vision.  Respiratory: Negative for cough and shortness of breath.   Cardiovascular: Positive for leg swelling. Negative for chest pain.  Gastrointestinal: Negative for heartburn, nausea and vomiting.  Genitourinary: Negative for dysuria, flank pain, hematuria and urgency.  Musculoskeletal: Positive for joint pain and myalgias.  Skin: Negative for rash.  Neurological: Positive for weakness.  Psychiatric/Behavioral: Positive for depression. The patient has insomnia.   All other systems reviewed and are negative.  Past Medical History:  Diagnosis Date  . Alcohol abuse   . Asthma   . Depression   . Hypertension    Past Surgical History:  Procedure Laterality Date  . BUBBLE STUDY  09/19/2020   Procedure: BUBBLE STUDY;  Surgeon: Pricilla Riffle, MD;  Location: [facility] ENDOSCOPY;  Service: Cardiovascular;;  . FRACTURE SURGERY    . I & D EXTREMITY Left 09/20/2020   Procedure: IRRIGATION AND DEBRIDEMENT EXTREMITY LEFT LEG ABCESS;  Surgeon: Teryl Lucy, MD;  Location: [facility] ORS;  Service:  Orthopedics;  Laterality: Left;  . I & D EXTREMITY Left 09/27/2020   Procedure: IRRIGATION AND  DEBRIDEMENT LEFT THIGH WITH WOUND  VAC APPLICATION;  Surgeon: Teryl Lucy, MD;  Location: [facility] ORS;  Service: Orthopedics;  Laterality: Left;  . INCISION AND DRAINAGE OF WOUND Left 09/27/2020   Procedure: IRRIGATION AND DEBRIDEMENT LEFT GROIN;  Surgeon: Luretha Murphy, MD;  Location: [facility] ORS;  Service: General;  Laterality: Left;  . IRRIGATION AND DEBRIDEMENT ABSCESS Left 09/22/2020   Procedure: IRRIGATION AND DEBRIDEMENT LEFT THIGH ABSCESS;  Surgeon: Harriette Bouillon, MD;  Location: [facility] ORS;  Service: General;  Laterality: Left;  DOW ROOM STARTING AT 09:00AM FOR 60 MIN  . TEE WITHOUT CARDIOVERSION N/A 09/19/2020   Procedure: TRANSESOPHAGEAL ECHOCARDIOGRAM (TEE);  Surgeon: Pricilla Riffle, MD;  Location[facility] ENDOSCOPY;  Service: Cardiovascular;  Laterality: N/A;  . TOTAL HIP ARTHROPLASTY Left 09/15/2020   Procedure: TOTAL HIP ARTHROPLASTY WITH CEMENT INTERPOSITION SPACING;  Surgeon: Teryl Lucy, MD;  Location: [facility] ORS;  Service: Orthopedics;  Laterality: Left;   Family History  Problem Relation Age of Onset  . Cirrhosis Neg Hx   . Diabetes Mellitus II Neg Hx    Social History:  reports that he has been smoking cigars. He has never used smokeless tobacco. He reports current alcohol use of about 12.0 - 18.0 standard drinks of alcohol per week. He reports current drug use. Drug: Marijuana. Allergies: No Active Allergies Medications Prior to Admission  Medication Sig Dispense Refill  . carvedilol (COREG) 3.125 MG tablet Take 3.125 mg by mouth 2 (two) times daily.    . folic acid (FOLVITE) 1 MG tablet Take 1 tablet (1 mg total) by mouth daily. 100 tablet 0  . guaiFENesin-dextromethorphan (ROBITUSSIN DM) 100-10 MG/5ML syrup Take 5 mLs by mouth every 4 (four) hours as needed for cough (chest congestion). (Patient taking differently: Take 10 mLs by mouth every 4 (four) hours as needed for cough (chest congestion).) 118 mL 0  . lactulose (CHRONULAC) 10 GM/15ML solution Take 15 mLs (10 g total) by mouth 2 (two)  times daily. 236 mL 0  . magnesium oxide (MAG-OX) 400 MG tablet Take 1 tablet by mouth daily.    . pantoprazole (PROTONIX) 40 MG tablet Take 1 tablet (40 mg total) by mouth 2 (two) times daily before a meal for 28 days, THEN 1 tablet (40 mg total) daily for 28 days. 84 tablet 0  . prednisoLONE (ORAPRED) 15 MG/5ML solution Take 1.6-13.3 mLs by mouth See admin instructions. Take 13.78ml by mouth daily for 28 days, 73ml for 4 days, 6.70ml daily for 4 days, 3.3 daily for 4 days, then 1.88ml daily for 3 days.    Marland Kitchen sertraline (ZOLOFT) 25 MG tablet Take 25 mg by mouth at bedtime.    . thiamine 100 MG tablet Take 1 tablet (100 mg total) by mouth daily. 100 tablet 0  . buPROPion (WELLBUTRIN XL) 300 MG 24 hr tablet Take 1 tablet (300 mg total) by mouth daily. (Patient not taking: No sig reported) 30 tablet 0  . carbamazepine (TEGRETOL XR) 200 MG 12 hr tablet Take 1 tablet (200 mg total) by mouth at bedtime. (Patient not taking: No sig reported) 30 tablet 0  . hydrOXYzine (ATARAX/VISTARIL) 25 MG tablet Take 1 tablet (25 mg total) by mouth every 6 (six) hours as needed for anxiety (or CIWA score </= 10). (Patient not taking: No sig reported) 30 tablet 0  . lisinopril-hydrochlorothiazide (ZESTORETIC) 20-25 MG tablet Take 1 tablet by mouth daily. (Patient not  taking: No sig reported)    . oxyCODONE (ROXICODONE) 5 MG immediate release tablet Take 1 tablet (5 mg total) by mouth every 8 (eight) hours as needed. (Patient not taking: No sig reported) 20 tablet 0  . [EXPIRED] prednisoLONE 5 MG TABS tablet Take 8 tablets (40 mg total) by mouth daily for 28 days, THEN 6 tablets (30 mg total) daily for 4 days, THEN 4 tablets (20 mg total) daily for 4 days, THEN 2 tablets (10 mg total) daily for 4 days, THEN 1 tablet (5 mg total) every 3 (three) days for 1 day. (Patient not taking: Reported on 09/13/2020) 275 tablet 0  . traMADol (ULTRAM) 50 MG tablet Take 1 tablet (50 mg total) by mouth every 6 (six) hours as needed. (Patient not  taking: Reported on 09/13/2020)      Drug Regimen Review Drug regimen was reviewed and remains appropriate with no significant issues identified  Home: Home Living Family/patient expects to be discharged to:: Private residence Living Arrangements: Spouse/significant other Available Help at Discharge: Family Type of Home: House Home Access: Stairs to enter [employer] of Steps: 2 in front, 5 in back, but has a ramp in place in front now Home Layout: One level Bathroom Shower/Tub: Health visitor: Handicapped height Bathroom Accessibility: Yes Home Equipment: Cane - single point,Crutches,Walker - 2 wheels,Bedside commode   Functional History: Prior Function Level of Independence: Independent with assistive device(s),Needs assistance Gait / Transfers Assistance Needed: since this started backin Jan he has had more and more difficultyw ith ambulation trying to use RW, but pain with the infection became so great he was having great amount of trouble walking. Prior to that he was working and independent. Comments: using cane, then progressed to crutches recently due to hip pain  Functional Status:  Mobility: Bed Mobility Overal bed mobility: Needs Assistance Bed Mobility: Sit to Supine Supine to sit: Min assist,+2 for physical assistance,+2 for safety/equipment Sit to supine: Mod assist General bed mobility comments: Increased time with cues for sequence and use of R LE to self assist.  Physical assist to manage LEs Transfers Overall transfer level: Needs assistance Equipment used: Rolling walker (2 wheeled) Transfer via Lift Equipment: Stedy Transfers: Sit to/from Stand Sit to Stand: Mod assist,+2 physical assistance,+2 safety/equipment Stand pivot transfers: Mod assist,Max assist,+2 physical assistance,+2 safety/equipment General transfer comment: cues for LE management and use of UEs to self assist; Increased physical assist to bring pt up from  Recliner Ambulation/Gait Ambulation/Gait assistance: Min assist,+2 safety/equipment Gait Distance (Feet): 5 Feet Assistive device: Rolling walker (2 wheeled) Gait Pattern/deviations: Step-to pattern,Decreased stride length,Shuffle,Antalgic General Gait Details: cues for sequence, posture and position from RW Gait velocity: decr    ADL: ADL Overall ADL's : Needs assistance/impaired Eating/Feeding: Independent Grooming: Oral care,Wash/dry face,Wash/dry hands,Sitting,Set up Grooming Details (indicate cue type and reason): Pt sat EOB and performed all grooming with setup only. Upper Body Bathing: Set up,Sitting Lower Body Bathing: +2 for physical assistance,Maximal assistance,+2 for safety/equipment,Sit to/from stand Upper Body Dressing : Set up,Bed level Lower Body Dressing: Total assistance,+2 for physical assistance,+2 for safety/equipment,Sit to/from stand Lower Body Dressing Details (indicate cue type and reason): Total assist to don socks. Left edema improved and changed to smaller sock. R foot still edematous Toilet Transfer: Minimal assistance,+2 for physical assistance,+2 for safety/equipment Toilet Transfer Details (indicate cue type and reason): from elevated bed height and use of stedy worked on standing tolerance and global strengthening necessary to safely complete functional transfers. Patient able to power up  to standing with min A x2, improved mobility from previous session Toileting- Clothing Manipulation and Hygiene: Total assistance,+2 for physical assistance,+2 for safety/equipment,Sit to/from stand Toileting - Clothing Manipulation Details (indicate cue type and reason): Total assist for pericare with use of steady and +2 assistance Functional mobility during ADLs: Moderate assistance,Maximal assistance,Rolling walker General ADL Comments: Pt. deferred EOB or OOB secondary to pain. Pt. states they did more sx on groin yesterday.  Cognition: Cognition Overall Cognitive  Status: Within Functional Limits for tasks assessed Orientation Level: Oriented X4 Cognition Arousal/Alertness: Awake/alert Behavior During Therapy: WFL for tasks assessed/performed Overall Cognitive Status: Within Functional Limits for tasks assessed General Comments: AxO x 3  Physical Exam: Blood pressure 108/75, pulse (!) 102, temperature 97.7 F (36.5 C), temperature source Oral, resp. rate 18, height 5\' 10"  (1.778 m), weight 85.4 kg, SpO2 98 %. Physical Exam Genitourinary:    Comments: Significant scrotal edema with Foley tube in place Skin:    Comments: Left hip incision groin site dressed with wound VAC in place  Neurological:     Comments: Patient is alert in no acute distress oriented x3 and follows commands.     Results for orders placed or performed during the hospital encounter of 09/13/20 (from the past 48 hour(s))  CBC     Status: Abnormal   Collection Time: 10/10/20  9:20 AM  Result Value Ref Range   WBC 12.4 (H) 4.0 - 10.5 K/uL   RBC 2.63 (L) 4.22 - 5.81 MIL/uL   Hemoglobin 8.6 (L) 13.0 - 17.0 g/dL   HCT 10/12/20 (L) 97.3 - 53.2 %   MCV 102.3 (H) 80.0 - 100.0 fL   MCH 32.7 26.0 - 34.0 pg   MCHC 32.0 30.0 - 36.0 g/dL   RDW 99.2 (H) 42.6 - 83.4 %   Platelets 277 150 - 400 K/uL   nRBC 0.0 0.0 - 0.2 %    Comment: Performed at [facility], [address] [place], [place]   [id number] ASCITES (ABDOMEN LIMITED)  Result Date: 10/09/2020 CLINICAL DATA:  History of alcoholic cirrhosis, recurrent symptomatic abdominal ascites, post paracentesis most recently 10/05/2020. EXAM: LIMITED ABDOMEN ULTRASOUND FOR ASCITES TECHNIQUE: Limited ultrasound survey for ascites was performed in all four abdominal quadrants. COMPARISON:  10/05/2020 FINDINGS: Small volume perihepatic and scattered abdominal ascites. No dominant pocket for safe therapeutic paracentesis. IMPRESSION: Small volume ascites.  Paracentesis deferred. Electronically Signed   By: 10/07/2020 M.D.    On: 10/09/2020 14:36   10/11/2020 EKG SITE RITE  Result Date: 10/08/2020 If Site Rite image not attached, placement could not be confirmed due to current cardiac rhythm.      Medical Problem List and Plan: 1.  Debility secondary to septic shock/left gluteal/groin abscesses/septic arthritis with history of avascular necrosis left hip  -patient may *** shower  -ELOS/Goals: *** 2.  Antithrombotics: -DVT/anticoagulation: SCDs.  Venous Doppler studies negative  -antiplatelet therapy: N/A 3. Pain Management: MS Contin 15 mg every 12 hours, MSIR 50 mg every 6 hours as needed 4. Mood: Zoloft 50 mg daily.  Provide emotional support  -antipsychotic agents: N/A 5. Neuropsych: This patient is capable of making decisions on his own behalf. 6. Skin/Wound Care: Routine skin checks 7. Fluids/Electrolytes/Nutrition: Routine in and outs with follow-up chemistries 8.  AVN left hip.  Status post left total hip arthroplasty with antibiotic spacer left hip excisional debridement of skin subcutaneous tissue muscle bone deep abscess 09/15/2020 per Dr. 09/17/2020.  Weightbearing as tolerated with posterior hip precautions  9.  Left groin and left hip abscess.  Status post multiple I&D's.  Wound VAC as directed. 10.  ID.  Continue ceftezole 2 g every 8 hours and Flagyl 500 mg every 8 hours and rifampin 300 mg every 12 hours through 10/28/2020 and stop.  Follow-up infectious disease 11.  Hepatic cirrhosis with ascites.  Status post multiple paracentesis.  GI recommends outpatient EGD.  Latest paracentesis 10/05/2020.  Continue Chronulac 10 mg twice daily 12.  Scrotal edema.  Follow-up urology services Dr. Laverle PatterBorden.  No concern for infection.  Continue Foley tube until edema improved and then plan voiding trial 13.  Anemia/thrombocytopenia.  Follow-up CBC 14.  Hypertension.  Lopressor 50 mg twice daily, Aldactone 100 mg daily, Lasix 40 mg daily.  Monitor with increased mobility 15.  Tobacco abuse.  Counseling   ***  Charlton AmorDaniel J  Angiulli, PA-C 10/10/2020

## 2020-10-10 NOTE — Consult Note (Addendum)
[facility] Nurse wound follow up Wound type: Surgical following drainage of left medial thigh abscess Measurement: 6cm x 4cm x 1.5-1.7cm with 8cm suture line distally at 6 o'clock Wound bed: Beefy red, no bleeding today. Base of wound with irregularity and sickle shaped defect, but no significant change in depth +0.2cm. Drainage (amount, consistency, odor) Serous to serosanguinous Periwound: Intact with evidence of previous wound healing Dressing procedure/placement/frequency: One piece of black foam removed with skin barrier strips. Wound cleansed with NS, and gently patted dry. Two skin barrier strips used to cover sutures and one piece of black foam used to fill defect.  Dressing and barrier strip covered with drape. Dressing attached to 113mHg continuous negative pressure via a T.R.A.C.C. pad and an immediate seal is achieved.  Patient's wife photographed wound for serial documentation of improvement. Patient tolerated procedure well.  Next scheduled dressing change is on Wednesday, 10/12/20.  Dressing kit is in the room for this change.  Patient and wife state that patient is expecting to transfer to CIR today.   [facility] will follow, seeing M/W/F for NPWT routine dressing changes and will remain available to this patient, the nursing and medical teams.    Thank you, LMaudie Flakes MSN, RN, GChancy Milroy CArther Abbott Pager# [phone]

## 2020-10-10 NOTE — Progress Notes (Signed)
Nutrition Follow-up  DOCUMENTATION CODES:   Not applicable  INTERVENTION:  - continue Boost Breeze BID, Juven BID, and 30 ml Prosource Plus BID. - will monitor for needs once patient transfers to CIR and begins therapy.  NUTRITION DIAGNOSIS:   Increased nutrient needs related to chronic illness as evidenced by estimated needs. -ongoing  GOAL:   Patient will meet greater than or equal to 90% of their needs -minimally met on average   MONITOR:   PO intake,Supplement acceptance,Labs,Weight trends,Skin  ASSESSMENT:   35 y.o. male who complains of acute on chronic left hip pain. He was scheduled for hip replacement surgery in early March, and has had a series of progressive medical complications including thrombocytopenia, liver failure, COVID-19, progressive inability to ambulate, as well as kidney failure.  He has had progressive edema in the left lower extremity.  He denies fevers or chills.  He denies IV drug use.  He has been decreasing his alcohol intake in preparation for his hip replacement surgery.  He has progressed now to the point where he cannot place weight on his left lower extremity, and is unable to function.  He was brought into the emergency room with severe anemia, requiring blood transfusion, jaundice, and left lower extremity swelling.  2/23: s/pCT guided aspiration/drainage of left thigh fluid collections in IR. 2/24: s/p left hip interposition arthroplasty and debridement 3/1: s/p left thigh I&D w/ wound VAC placement 3/3: s/p repeat I&D 3/8: s/p repeat I&D   Most recently documented meal intakes: 3/17: 100% of breakfast--806 kcal and 15 grams protein 3/18: 50% of breakfast--493 kcal and 8 grams protein 3/19: 80% of breakfast and 100% of dinner--total of 743 kcal and 14 grams protein  He has been accepting Boost Breeze 75% of the time offered (250 kcal and 9 grams protein each); Juven 90% of the time offered (95 kcal and 2.5 grams protein each); Prosource  Plus 100% of the time offered (100 kcal and 15 grams protein each).   Weight today is consistent with admission weight. Mild pitting edema to BLE and moderate pitting edema to perineal area documented in the edema section of flow sheet.  Patient is pending d/c to CIR when a bed becomes available.      Labs reviewed; Na: 130 mmol/l, creatinine: 0.31 mg/dl, Ca: 7.7 mg/dl, Alk Phos elevated.  Medications reviewed; 100 mg colace BID, 1 mg folvite/day, 40 mg oral lasix/day, 10 g lactulose BID, 1 tablet multivitamin with minerals/day, 40 mg oral protonix BID, 1 tablet senokot BID, 100 mg aldactone/day, 100 mg thiamine/day.   Diet Order:   Diet Order            Diet 2 gram sodium Room service appropriate? Yes; Fluid consistency: Thin  Diet effective now                 EDUCATION NEEDS:   No education needs have been identified at this time  Skin:  Skin Assessment: Skin Integrity Issues: Skin Integrity Issues:: Incisions Incisions: L thigh, groin and hip  Last BM:  3/20  Height:   Ht Readings from Last 1 Encounters:  10/06/20 5' 10"  (1.778 m)    Weight:   Wt Readings from Last 1 Encounters:  10/07/20 85.4 kg    Estimated Nutritional Needs:  Kcal:  2200-2400 kcal Protein:  125-140 grams Fluid:  >/= 2.3 L/day      Jarome Matin, MS, RD, LDN, CNSC Inpatient Clinical Dietitian RD pager # available in AMION  After hours/weekend pager #  available in [facility]

## 2020-10-10 NOTE — Progress Notes (Signed)
Pt arrived via EMS. Pt is alert and oriented able to make needs known, wound vac attached at 125 mmHg. Oriented to rehab

## 2020-10-10 NOTE — Progress Notes (Signed)
Inpatient Rehabilitation Medication Review by a Pharmacist  A complete drug regimen review was completed for this patient to identify any potential clinically significant medication issues.  Clinically significant medication issues were identified:  no  Check AMION for pharmacist assigned to patient if future medication questions/issues arise during this admission.  Pharmacist comments:   Time spent performing this drug regimen review (minutes):  15 minutes   Ike Bene 10/10/2020 6:34 PM

## 2020-10-10 NOTE — Discharge Summary (Signed)
Physician Discharge Summary  [name] ZOX:[id number] DOB: [birth year] DOA: 09/13/2020  PCP: [name], PA  Admit date: 09/13/2020 Discharge date: 10/10/2020  Admitted From: Home Disposition: [facility]  Recommendations for Outpatient Follow-up:  1. Follow-up with general, orthopedic surgery 2. Follow-up with [facility] 3. Please follow up on the following pending results: None  Discharge Condition: Stable CODE STATUS: Full code Diet recommendation: 2 gram sodium diet; fluid restriction of 1.5 L daily   Brief/Interim Summary:  Admission HPI written by Margie Ege, DO   Chief Complaint: fatigue, left leg pain  HPI: [name] is a 35 y.o. male with medical history significant of AVN of hip, HTN, GERD. Presenting with fatigue and left leg pain. Recent admission for COVID (08/22/20). Successfully completed therapy and was discharged on 08/29/20. At that time he was discharged to treatment for alcoholic hepatitis as well. He was ok for a few days after discharge, but he began to feel very tired and weak. He did not try any medicines to help. He tried rest. This progressed over these last 2 weeks. Also during this time, he began having left thigh and leg pain. He reports that the left thigh had become increasingly swollen and tender to touch. He has had a blister rupture on the inner thigh in the groin area. These symptoms increased through 2 days ago when he was no longer ago to walk without great pain. He saw his PCP yesterday who drew labs. He was called this morning by that team and advised to go to the ED immediately. He denies any other aggravating or alleviating factors.    Hospital course:  MSSA bacteremia In setting of multiple abscesses as mentioned below. Transthoracic Echocardiogram and Transesophageal Echocardiogram obtained and were without evidence of vegetations. ID consulted with recommendations for Cafazolin/Flagyl/rifampin as  mentioned below.  Iliopsoas/left gluteal/groin abscesses Septic arthritis History of avascular necrosis Orthopedic surgery, IR and ID consulted. Patient underwent multiple I&Ds in addition to left total hip arthroplasty. Three drains in place including wound vac. Infectious disease recommendations for Cefazolin IV, Flagyl PO and rifampin PO until April 8th with reevaluation at that time. Discharged with a wound vac, for which orthopedic surgery, Delbert Harness, will manage on discharge. MWF dressing changes per wound care.  Septic shock Secondary to above. Lactic acid peak of 5.3. Managed with Levophed vasopressor support and  IV fluid resuscitation. Required ICU admission for 48 hours. Resolved.  Hypervolemic hyponatremia Secondary to fluid overload from fluid resuscitation from septic shock in addition to underlying cirrhosis/hypoalbuminemia and poor fluid balance. Nadir of 124. In/out measurements suggest net negative for admission, however patient's weight is up 26 lbs. Weight pending today Sodium up to 127 today with goal likely between 127-130. Limited abdominal ultrasound performed on 3/20 which was significant for insignificant amounts of ascites fluid. Discharge on Lasix 40 mg daily and spironolactone 100 mg daily.  Multifocal pneumonia Seen on CT scan on 3/11. No symptoms currently. On room air. Currently on antibiotic coverage for above infections.  Chronic pain In setting of medical illness. While admitted, he was started on MS contin BID. Recommend weaning off as able.  Symptomatic anemia Macrocytic anemia No active bleeding noted. Patient received a total of 6 units of PRBC. Hemoglobin currently stable.  Hepatic cirrhosis with ascites Anasarca History of alcoholic hepatitis Portal hypertension GI consulted. Patient managed with intermittent paracenteses in addition to lasix and spironolactone. GI recommending EGD to screen for esophageal varices which can be done  as an  outpatient. Max weight of 213.85 kg; down to 85.4 kg on 3/18. Paracentesis last performed on 3/16. Fluid managed with Lasix and spironolactone.  Scrotal edema Urology consulted. No concern for infection. In setting of anasarca as mentioned above but possibly with some reactive change from adjacent infections. No evidence of abscess or local skin infection. Improving. Urology recommending to continue foley until able to use urinal. Overall, edema is significantly improved. Foley catheter placed. Urology recommendations to keep the foley catheter until able to void effectively independently.  Thrombocytopenia Likely related to acute infection. He received a total of 1 unit of platelets in addition to 1 unit of FFP. Resolved.  Sinus tachycardia Outpatient problem. Patient followed by cardiology. Managed on metoprolol.  Primary hypertension Blood pressure well controlled. Coreg switched to metoprolol. Started on Lasix and spironolactone for above.  GAD Increased to Zoloft 50 mg daily.  GERD Continue Protonix  Metabolic acidosis Resolved  Hypophosphatemia Resolved.  Hypomagnesemia Replete as needed.  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)   Ascites due to alcoholic hepatitis   Portal hypertension (HCC)   Alcoholic cirrhosis of liver without ascites [facility])    Discharge Instructions  Discharge Instructions    Call MD for:  redness, tenderness, or signs of infection (pain, swelling, redness, odor or green/yellow discharge around incision site)   Complete by: As directed    Call MD for:  severe uncontrolled pain   Complete by: As directed    Call MD for:  temperature >100.4   Complete by: As directed    Diet - low sodium heart healthy   Complete by: As directed    Discharge wound care:   Complete by: As  directed    Wound care  Every shift      Comments: Cleanse abdominal and inguinal skin folds with soap and water and pat dry.   Interdry [facility] # [phone]) to skin folds:  Measure and cut length of InterDry to fit in skin folds that have skin breakdown Tuck InterDry fabric into skin folds in a single layer, allow for 2 inches of overhang from skin edges to allow for wicking to occur May remove to bathe; dry area thoroughly and then tuck into affected areas again Do not apply any creams or ointments when using InterDry DO NOT THROW AWAY FOR 5 DAYS unless soiled with stool DO NOT [facility] product, this will inactivate the silver in the material  New sheet of Interdry should be applied after 5 days of use if patient continues to have skin breakdown   Increase activity slowly   Complete by: As directed      Allergies as of 10/10/2020   No Active Allergies     Medication List    STOP taking these medications   buPROPion 300 MG 24 hr tablet Commonly known as: WELLBUTRIN XL   carbamazepine 200 MG 12 hr tablet Commonly known as: TEGRETOL XR   carvedilol 3.125 MG tablet Commonly known as: COREG   guaiFENesin-dextromethorphan 100-10 MG/5ML syrup Commonly known as: ROBITUSSIN DM   hydrOXYzine 25 MG tablet Commonly known as: ATARAX/VISTARIL   lisinopril-hydrochlorothiazide 20-25 MG tablet Commonly known as: ZESTORETIC   magnesium oxide 400 MG tablet Commonly known as: MAG-OX   oxyCODONE 5 MG immediate release tablet Commonly known as: Roxicodone   prednisoLONE 15 MG/5ML  solution Commonly known as: ORAPRED   prednisoLONE 5 MG Tabs tablet   traMADol 50 MG tablet Commonly known as: ULTRAM     TAKE these medications   (feeding supplement) PROSource Plus liquid Take 30 mLs by mouth 2 (two) times daily between meals.   nutrition supplement (JUVEN) Pack Take 1 packet by mouth 2 (two) times daily between meals.   ceFAZolin 2-4 GM/100ML-% IVPB Commonly known as: ANCEF Inject 100  mLs (2 g total) into the vein every 8 (eight) hours for 19 days.   docusate sodium 100 MG capsule Commonly known as: COLACE Take 1 capsule (100 mg total) by mouth 2 (two) times daily.   folic acid 1 MG tablet Commonly known as: FOLVITE Take 1 tablet (1 mg total) by mouth daily.   furosemide 40 MG tablet Commonly known as: LASIX Take 1 tablet (40 mg total) by mouth daily. Start taking on: October 11, 2020   hydrocortisone cream 1 % Apply topically 2 (two) times daily.   lactulose 10 GM/15ML solution Commonly known as: CHRONULAC Take 15 mLs (10 g total) by mouth 2 (two) times daily.   metoprolol tartrate 50 MG tablet Commonly known as: LOPRESSOR Take 1 tablet (50 mg total) by mouth 2 (two) times daily.   metroNIDAZOLE 500 MG tablet Commonly known as: FLAGYL Take 1 tablet (500 mg total) by mouth every 8 (eight) hours for 19 days.   morphine 15 MG 12 hr tablet Commonly known as: MS CONTIN Take 1 tablet (15 mg total) by mouth every 12 (twelve) hours.   multivitamin with minerals Tabs tablet Take 1 tablet by mouth daily. Start taking on: October 11, 2020   pantoprazole 40 MG tablet Commonly known as: PROTONIX Take 1 tablet (40 mg total) by mouth daily. What changed: See the new instructions.   polyethylene glycol 17 g packet Commonly known as: MIRALAX / GLYCOLAX Take 17 g by mouth daily as needed for moderate constipation.   rifampin 300 MG capsule Commonly known as: RIFADIN Take 1 capsule (300 mg total) by mouth every 12 (twelve) hours for 19 days.   senna 8.6 MG Tabs tablet Commonly known as: SENOKOT Take 1 tablet (8.6 mg total) by mouth 2 (two) times daily.   sertraline 50 MG tablet Commonly known as: ZOLOFT Take 1 tablet (50 mg total) by mouth daily. Start taking on: October 11, 2020 What changed:   medication strength  how much to take  when to take this   spironolactone 100 MG tablet Commonly known as: ALDACTONE Take 1 tablet (100 mg total) by mouth  daily. Start taking on: October 11, 2020   thiamine 100 MG tablet Take 1 tablet (100 mg total) by mouth daily.            Discharge Care Instructions  (From admission, onward)         Start     Ordered   10/10/20 0000  Discharge wound care:       Comments: Wound care  Every shift      Comments: Cleanse abdominal and inguinal skin folds with soap and water and pat dry.   Interdry [facility] # [phone]) to skin folds:  Measure and cut length of InterDry to fit in skin folds that have skin breakdown Tuck InterDry fabric into skin folds in a single layer, allow for 2 inches of overhang from skin edges to allow for wicking to occur May remove to bathe; dry area thoroughly and then tuck into affected areas again Do  not apply any creams or ointments when using InterDry DO NOT THROW AWAY FOR 5 DAYS unless soiled with stool DO NOT [facility] product, this will inactivate the silver in the material  New sheet of Interdry should be applied after 5 days of use if patient continues to have skin breakdown   10/10/20 1246          Follow-up Information    Teryl Lucy, MD. Schedule an appointment as soon as possible for a visit in 2 weeks.   Specialty: Orthopedic Surgery Contact information: [address] [place] [address] [phone]        Sur[facility], [place] Follow up in 1 month(s).   Specialty: General Surgery Why: Call when you get out of rehab for [place] to check your left groin wounds if not healed by then.  You will need to see Puja in the "[facility] clinic" Contact information: [address] [place] [address] [phone]              No Active Allergies  Consultations:  General surgery  Orthopedic surgery  Infectious disease  Urology  Gastroenterology  Nephrology  Procedures/Studies: DG Chest 1 View  Result Date: 09/13/2020 CLINICAL DATA:  Leg swelling EXAM: CHEST  1 VIEW COMPARISON:  08/25/2020. FINDINGS: Mild  hypoinflation. No pneumothorax, pleural effusion or focal consolidation. Cardiomediastinal silhouette within normal limits. No acute osseous abnormality. IMPRESSION: No focal airspace disease. Electronically Signed   By: Stana Bunting M.D.   On: 09/13/2020 10:59   CT CHEST WO CONTRAST  Result Date: 09/30/2020 CLINICAL DATA:  Fluid collection, drain placement EXAM: CT CHEST WITHOUT CONTRAST TECHNIQUE: Multidetector CT imaging of the chest was performed following the standard protocol without IV contrast. COMPARISON:  09/30/2020 FINDINGS: Cardiovascular: Unenhanced imaging of the heart great vessels demonstrates no pericardial effusion. Normal caliber of the thoracic aorta. Mediastinum/Nodes: No enlarged mediastinal or axillary lymph nodes. Thyroid gland, trachea, and esophagus demonstrate no significant findings. Lungs/Pleura: There is patchy bilateral upper lobe predominant airspace disease consistent with multifocal pneumonia. Trace left pleural effusion. No pneumothorax. Central airways are patent. Upper Abdomen: Hepatomegaly with nodular liver contour consistent with cirrhosis unchanged. Small volume upper abdominal ascites, greatest in the right upper quadrant. No other acute upper abdominal finding. Musculoskeletal: No acute or destructive bony lesions. Subcutaneous edema within the bilateral flanks. Reconstructed images demonstrate no additional findings. IMPRESSION: 1. Patchy bilateral upper lobe predominant airspace disease consistent with multifocal pneumonia. 2. Trace left pleural effusion. 3. Cirrhosis. 4. Trace ascites. Electronically Signed   By: Sharlet Salina M.D.   On: 09/30/2020 19:16   US SCROTUM  Result Date: 09/27/2020 CLINICAL DATA:  Swelling.  Drain placement in left thigh on 03/04. EXAM: SCROTAL ULTRASOUND DOPPLER ULTRASOUND OF THE TESTICLES TECHNIQUE: Complete ultrasound examination of the testicles, epididymis, and other scrotal structures was performed. Color and spectral  Doppler ultrasound were also utilized to evaluate blood flow to the testicles. COMPARISON:  Ultrasound dated 09/17/2020 FINDINGS: Right testicle Measurements: 3 x 2.1 x 2.3 cm. No mass or microlithiasis visualized. Left testicle Measurements: 3.5 x 2.1 x 1.8 cm. No mass or microlithiasis visualized. Right epididymis:  Normal in size and appearance. Left epididymis:  Normal in size and appearance. Hydrocele: There are bilateral relatively small hydroceles, right greater than left. Varicocele:  None visualized. Pulsed Doppler interrogation of both testes demonstrates normal low resistance arterial and venous waveforms bilaterally. There is severe diffuse scrotal wall thickening, greatest on the left. IMPRESSION: 1. No testicular  mass. 2. No testicular torsion. 3. Diffuse scrotal wall thickening, greatest on the left. Findings are concerning for an underlying soft tissue infection. The soft tissues are edematous without a clear drainable fluid collection. Electronically Signed   By: Katherine Mantle M.D.   On: 09/27/2020 17:06   CT ABDOMEN PELVIS W CONTRAST  Result Date: 09/30/2020 CLINICAL DATA:  35 year old male with history of MSSA bacteremia and left gluteal and thigh abscesses status post multiple drains. CT for follow-up in light of persistent leukocytosis. EXAM: CT ABDOMEN AND PELVIS WITH CONTRAST TECHNIQUE: Multidetector CT imaging of the abdomen and pelvis was performed using the standard protocol following bolus administration of intravenous contrast. CONTRAST:  OMNIPAQUE IOHEXOL 300 MG/ML  SOLN COMPARISON:  09/18/2020, 09/21/2020, 09/23/2020 FINDINGS: Lower chest: Interval development of scattered ground-glass in consolidative pulmonary opacities, most prominent in the right middle lobe and anterior left upper lobe, incompletely visualized. Trace left pleural effusion with associated left basilar passive atelectasis. Hepatobiliary: Similar appearing nodular contour of the liver with hypertrophied  left lobe. There is mild periportal edema. No hepatoma identified. The gallbladder is decompressed with minimal surrounding ascites, no gallstones are visualized. No intra or extrahepatic biliary ductal dilation. Pancreas: Unremarkable. No pancreatic ductal dilatation or surrounding inflammatory changes. Spleen: Normal in size without focal abnormality. Adrenals/Urinary Tract: Adrenal glands are unremarkable. Kidneys are normal, without renal calculi, focal lesion, or hydronephrosis. Bladder is decompressed with Foley catheter in place. Stomach/Bowel: Stomach is within normal limits. Appendix is not definitively visualized. No evidence of bowel wall thickening, distention, or inflammatory changes. 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 The portal system appears patent. No abdominopelvic lymphadenopathy. Reproductive: Bilateral, left greater at hydroceles are present. Prostate is unremarkable. Other: Diffuse body wall anasarca, advanced from comparison. Edema extends into the bilateral proximal thighs. Small volume ascites is present, most prominent in the pelvis. Musculoskeletal: Resolution of previously visualized psoas abscess at the site of indwelling retroperitoneal pigtail drain. In the superior aspect of the left iliacus muscle there is a peripherally enhancing fluid collection measuring approximately 3.4 x 2.5 x 1.6 cm, relatively unchanged from comparison studies. Within the anterolateral aspect of the proximal thigh, within the vastus musculature is a peripherally enhancing, lobular fluid collection measuring up to approximately 7.4 x 3.4 x 3.4 cm. There is persistent small fluid collection at the site of indwelling left anterior thigh pigtail drains which are unchanged in position. No new fluid collections are present. Status post debridement of the medial anterior left thigh with scattered subcutaneous emphysema, improved from comparison.  Status post left total hip arthroplasty without complicating features. No evidence of cortical interruption along the femur at the sites of abscess formation. No acute fracture or malalignment. IMPRESSION: 1. Persistent undrained left iliacus and left anterolateral intramuscular fluid collections with peripheral enhancement concerning for abscesses. The iliacus fluid collection would be amenable to CT-guided aspiration and the anterior thigh fluid collection would be amenable to percutaneous drain placement. 2. Resolution of left psoas abscess after drain placement. 3. Persistent small peripheral enhancing fluid collections around the indwelling distal anterior thigh drains. 4. Interval development of ground-glass and consolidative opacities in the mid lungs bilaterally, incompletely evaluated. Consider chest CT for further characterization as these findings are concerning for multifocal pneumonia. 5. Suggestion of fluid overload as evidence by interval worsening diffuse body wall anasarca, small volume ascites, trace left pleural effusion with associated left basilar passive atelectasis, and diffuse scrotal wall thickening with small volume hydroceles. 6. Morphologic  changes of hepatic cirrhosis, no hepatoma visualized. Marliss Coots, MD [facility] Radiology Electronically Signed   By: Marliss Coots MD   On: 09/30/2020 14:38   CT ABDOMEN PELVIS W CONTRAST  Result Date: 09/21/2020 CLINICAL DATA:  Abdominal abscess/infection suspected. Status post orthopedic surgery on 09/15/2020. LEFT thigh incision and drainage on 09/20/2020. EXAM: CT ABDOMEN AND PELVIS WITH CONTRAST CT LEFT FEMUR WITH CONTRAST TECHNIQUE: Multidetector CT imaging of the abdomen and pelvis was performed using the standard protocol following bolus administration of intravenous contrast. CONTRAST:  OMNIPAQUE IOHEXOL 300 MG/ML  SOLN COMPARISON:  CT abdomen pelvis dated 09/18/2020. CT LEFT femur  dated 09/18/2020. FINDINGS: CT abdomen AND PELVIS FINDINGS: Lower chest: Mild bibasilar atelectasis. Hepatobiliary: Cirrhotic-appearing liver. No focal mass or lesion is seen within the liver. Gallbladder is unremarkable. No bile duct dilatation. Pancreas: Unremarkable. No pancreatic ductal dilatation or surrounding inflammatory changes. Spleen: Perhaps mild splenomegaly.  Otherwise unremarkable. Adrenals/Urinary Tract: Adrenal glands appear normal. Kidneys are unremarkable without mass, stone or hydronephrosis. No ureteral or bladder calculi are identified. Bladder is decompressed by Foley catheter. Stomach/Bowel: No dilated large or small bowel loops. Stomach is unremarkable. Vascular/Lymphatic: No acute appearing vascular abnormality. No enlarged lymph nodes are seen. Reproductive: Prostate gland is unremarkable. Other: Moderate to large ascites, similar to the previous CT of 09/18/2020. No new fluid collection or abscess. No free intraperitoneal air. Musculoskeletal: No acute appearing osseous abnormality. Stable position of the LEFT inguinal approach surgical drain with pigtail terminating at the site of a previously demonstrated fluid collection in the LEFT lower psoas muscle. This collection has resolved in the interval. The additional complex fluid collection within the iliacus muscle is also significantly improved and nearly resolved, perhaps a small residual component overlying the LEFT SI joint (series 5, image 81). Persistent anasarca without significant change. CT LEFT FEMUR FINDINGS: Interval surgical evacuation of the abscess collection in the medial LEFT thigh. No persistent fluid collection is seen, with expected postsurgical changes of the overlying soft tissues and with associated packing in place at the overlying skin defect. Grossly stable abscess collection anterior to the upper LEFT femur, measuring 3.3 cm (series 11, image 150). The larger fluid collection seen at this level on the previous  study is no longer identified status post the interval incision and drainage. The drainage catheters have been removed from the upper thigh. No new fluid collection or abscess-like collection is identified within the soft tissues about the LEFT femur. Soft tissues about the LEFT knee are unremarkable. No hemorrhage or contrast extravasation is seen within the soft tissues about the LEFT femur. Osseous femur itself appears intact and normal in mineralization. No cortical defect, lucency or destructive change to suggest associated osteomyelitis. IMPRESSION: 1. Stable position of the LEFT inguinal approach surgical drain with pigtail terminating at the site of a previously demonstrated fluid collection in the LEFT lower psoas muscle. This psoas muscle collection has resolved in the interval. 2. The additional complex fluid collection within the iliacus muscle is also significantly improved and nearly resolved, perhaps a small residual component overlying the LEFT SI joint. 3. Moderate to large ascites, similar to the previous CT of 09/18/2020. No new fluid collection or abscess. 4. Cirrhotic-appearing liver. 5. Largest abscess collections within the medial aspect of the LEFT thigh are no longer seen status post interval incision and drainage. Expected postsurgical changes of the overlying soft tissues. No evidence of surgical complicating feature. 6. Stable smaller abscess collection anterior to the LEFT  femur, measuring 3.3 cm greatest dimension. 7. Osseous femur appears intact and normal in mineralization. No evidence of associated osteomyelitis. Electronically Signed   By: Bary Richard M.D.   On: 09/21/2020 17:42   CT ABDOMEN PELVIS W CONTRAST  Result Date: 09/18/2020 CLINICAL DATA:  f ollow up drain placement in Abd/pelvis and left femur EXAM: CT ABDOMEN AND PELVIS WITH CONTRAST TECHNIQUE: Multidetector CT imaging of the abdomen and pelvis was performed using the standard protocol following bolus  administration of intravenous contrast. CONTRAST:  OMNIPAQUE IOHEXOL 300 MG/ML  SOLN COMPARISON:  CT abdomen pelvis 09/13/2020 FINDINGS: Lower chest: Bilateral lower lobe subsegmental atelectasis. 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 Slightly heterogeneous appearance of the hepatic parenchyma. No definite focal hepatic lesion; however, limited evaluation on this single-phase venous contrast study. No CT findings of calcified gallstones. No gallbladder wall thickening. No biliary ductal dilatation. Pancreas: No focal lesion. Normal pancreatic contour. No surrounding inflammatory changes. No main pancreatic ductal dilatation. Spleen: Normal in size without focal abnormality. Adrenals/Urinary Tract: No adrenal nodule bilaterally. Bilateral kidneys enhance symmetrically. No hydronephrosis. No hydroureter. Foci of gas within the urinary bladder lumen likely related to recent instrumentation. Otherwise the urinary bladder is unremarkable. Stomach/Bowel: Stomach is within normal limits. No evidence of bowel wall thickening or dilatation. The appendix not definitely identified. Vascular/Lymphatic: The portal, splenic, superior mesenteric veins are patent. No abdominal aorta or iliac aneurysm. Multiple prominent but nonenlarged asymmetric left inguinal lymph nodes. Similar finding of the left external iliac lymph nodes. No abdominal, pelvic, or inguinal lymphadenopathy. Reproductive: Prostate is unremarkable. Scrotal subcutaneus soft tissue edema again noted, likely slightly worsened. Other: Interval increase in small to moderate volume simple free fluid ascites. No free intraperitoneal gas. Musculoskeletal: Interval placement of a left inguinal approach surgical drain with pigtail terminating within a fluid collection associated with the left psoas muscle. The fluid collections are noted to be septated with peripheral enhancement. There has been interval decrease in size of the  iliopsoas fluid collections with the largest component of the psoas muscle measuring up to 4.3 x 1.3 cm (from 6.5 x 5.6 cm and inferiorly the largest iliacus portion that appears to be septated measuring approximately 5.8 x 4.4 cm (from 8.3 x 6.7 cm). Redemonstration of proximal left thigh extensive subcutaneus soft tissue edema as well as several foci of emphysema. Associated multiple fluid collections that are peripherally enhancing. Status post interval surgical drain placement that is partially visualized. Please see separately dictated CT left femur 09/13/2020 for further details. Persistent subcutaneus soft tissue edema that is increased compared to prior. Interval placement of skin staples overlying the left hip. Status post interval partially visualized left total hip arthroplasty. IMPRESSION: 1. Interval decrease in size of multiple multiseptated iliopsoas abscesses status post interval surgical drain placement with pigtail terminating within a fluid collection associated with the left psoas muscle. 2. Redemonstration of proximal left thigh extensive subcutaneus soft tissue edema as well as several foci of emphysema. Associated left lower extremity multiple abscesses status post partially visualized interval drain catheter. Status post interval partially visualized left total hip arthroplasty. Please see separately dictated CT left femur 09/18/2020 for further details. 3. Foci of gas within the urinary bladder lumen. Correlate with recent instrumentation. 4. Interval increase in small to moderate volume simple free fluid ascites. 5. Interval increase in diffuse subcutaneus soft tissue edema. 6. Cirrhotic morphology of the liver. Markedly limited evaluation for focal hepatic lesion on this single phase venous contrast study. Recommend non-emergent  MRI hepatic liver protocol for further evaluation. 7. Please see separately dictated CT left femur 09/18/20 further details. Electronically Signed   By: Tish Frederickson M.D.   On: 09/18/2020 17:14   CT ABDOMEN PELVIS W CONTRAST  Result Date: 09/13/2020 CLINICAL DATA:  Hepatic cirrhosis. EXAM: CT ABDOMEN AND PELVIS WITH CONTRAST TECHNIQUE: Multidetector CT imaging of the abdomen and pelvis was performed using the standard protocol following bolus administration of intravenous contrast. CONTRAST:  OMNIPAQUE IOHEXOL 300 MG/ML  SOLN COMPARISON:  None. FINDINGS: Lower chest: No acute abnormality. Hepatobiliary: No gallstones or biliary dilatation is noted. Mildly nodular hepatic contours are noted suggesting hepatic cirrhosis. No definite focal hepatic abnormality is noted. Pancreas: Unremarkable. No pancreatic ductal dilatation or surrounding inflammatory changes. Spleen: Normal in size without focal abnormality. Adrenals/Urinary Tract: Adrenal glands are unremarkable. Kidneys are normal, without renal calculi, focal lesion, or hydronephrosis. Bladder is unremarkable. Stomach/Bowel: Stomach is within normal limits. Appendix appears normal. No evidence of bowel wall thickening, distention, or inflammatory changes. Vascular/Lymphatic: No significant vascular findings are present. No enlarged abdominal or pelvic lymph nodes. Reproductive: Prostate is unremarkable. Other: There is the interval development of fluid collections within the musculature of the left ileo psoas muscle as well as the musculature of the visualized portion of the left gluteal region and proximal left thigh. Diffuse swelling is noted, and these findings are most consistent with innumerable abscesses that have formed within the musculature. Mild subcutaneous edema or inflammation is seen involving the visualized portions of the left upper thigh and hip region. Musculoskeletal: There is significant destruction of the left femoral head which may be related to chronic septic arthritis or inflammation. Left hip effusion is noted. IMPRESSION: 1. Findings consistent with hepatic cirrhosis. 2. Interval  development of innumerable fluid collections are seen within the musculature of the left ileopsoas muscle as well as the musculature of the visualized portion of the left gluteal region and proximal left thigh consistent with innumerable abscesses that have formed within the musculature, several of which are quite large. Diffuse swelling is noted, and there is noted significant destruction of the left femoral head which may be related to chronic septic arthritis or inflammation. Left hip effusion is noted. 3. Mild subcutaneous edema or inflammation is seen involving the visualized portions of the left upper thigh and hip region. Electronically Signed   By: Lupita Raider M.D.   On: 09/13/2020 12:23   CT FEMUR LEFT W CONTRAST  Result Date: 09/30/2020 CLINICAL DATA:  35 year old male with history of MSSA bacteremia and left gluteal and thigh abscesses status post multiple drains. CT for follow-up in light of persistent leukocytosis. EXAM: CT ABDOMEN AND PELVIS WITH CONTRAST TECHNIQUE: Multidetector CT imaging of the abdomen and pelvis was performed using the standard protocol following bolus administration of intravenous contrast. CONTRAST:  OMNIPAQUE IOHEXOL 300 MG/ML  SOLN COMPARISON:  09/18/2020, 09/21/2020, 09/23/2020 FINDINGS: Lower chest: Interval development of scattered ground-glass in consolidative pulmonary opacities, most prominent in the right middle lobe and anterior left upper lobe, incompletely visualized. Trace left pleural effusion with associated left basilar passive atelectasis. Hepatobiliary: Similar appearing nodular contour of the liver with hypertrophied left lobe. There is mild periportal edema. No hepatoma identified. The gallbladder is decompressed with minimal surrounding ascites, no gallstones are visualized. No intra or extrahepatic biliary ductal dilation. Pancreas: Unremarkable. No pancreatic ductal dilatation or surrounding inflammatory changes. Spleen: Normal in size without  focal abnormality. Adrenals/Urinary Tract: Adrenal glands are unremarkable. Kidneys are normal, without renal  calculi, focal lesion, or hydronephrosis. Bladder is decompressed with Foley catheter in place. Stomach/Bowel: Stomach is within normal limits. Appendix is not definitively visualized. No evidence of bowel wall thickening, distention, or inflammatory changes. 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 The portal system appears patent. No abdominopelvic lymphadenopathy. Reproductive: Bilateral, left greater at hydroceles are present. Prostate is unremarkable. Other: Diffuse body wall anasarca, advanced from comparison. Edema extends into the bilateral proximal thighs. Small volume ascites is present, most prominent in the pelvis. Musculoskeletal: Resolution of previously visualized psoas abscess at the site of indwelling retroperitoneal pigtail drain. In the superior aspect of the left iliacus muscle there is a peripherally enhancing fluid collection measuring approximately 3.4 x 2.5 x 1.6 cm, relatively unchanged from comparison studies. Within the anterolateral aspect of the proximal thigh, within the vastus musculature is a peripherally enhancing, lobular fluid collection measuring up to approximately 7.4 x 3.4 x 3.4 cm. There is persistent small fluid collection at the site of indwelling left anterior thigh pigtail drains which are unchanged in position. No new fluid collections are present. Status post debridement of the medial anterior left thigh with scattered subcutaneous emphysema, improved from comparison. Status post left total hip arthroplasty without complicating features. No evidence of cortical interruption along the femur at the sites of abscess formation. No acute fracture or malalignment. IMPRESSION: 1. Persistent undrained left iliacus and left anterolateral intramuscular fluid collections with peripheral enhancement concerning  for abscesses. The iliacus fluid collection would be amenable to CT-guided aspiration and the anterior thigh fluid collection would be amenable to percutaneous drain placement. 2. Resolution of left psoas abscess after drain placement. 3. Persistent small peripheral enhancing fluid collections around the indwelling distal anterior thigh drains. 4. Interval development of ground-glass and consolidative opacities in the mid lungs bilaterally, incompletely evaluated. Consider chest CT for further characterization as these findings are concerning for multifocal pneumonia. 5. Suggestion of fluid overload as evidence by interval worsening diffuse body wall anasarca, small volume ascites, trace left pleural effusion with associated left basilar passive atelectasis, and diffuse scrotal wall thickening with small volume hydroceles. 6. Morphologic changes of hepatic cirrhosis, no hepatoma visualized. Marliss Coots, MD [facility] Radiology Electronically Signed   By: Marliss Coots MD   On: 09/30/2020 14:38   CT FEMUR LEFT W CONTRAST  Result Date: 09/21/2020 CLINICAL DATA:  Abdominal abscess/infection suspected. Status post orthopedic surgery on 09/15/2020. LEFT thigh incision and drainage on 09/20/2020. EXAM: CT ABDOMEN AND PELVIS WITH CONTRAST CT LEFT FEMUR WITH CONTRAST TECHNIQUE: Multidetector CT imaging of the abdomen and pelvis was performed using the standard protocol following bolus administration of intravenous contrast. CONTRAST:  OMNIPAQUE IOHEXOL 300 MG/ML  SOLN COMPARISON:  CT abdomen pelvis dated 09/18/2020. CT LEFT femur dated 09/18/2020. FINDINGS: CT abdomen AND PELVIS FINDINGS: Lower chest: Mild bibasilar atelectasis. Hepatobiliary: Cirrhotic-appearing liver. No focal mass or lesion is seen within the liver. Gallbladder is unremarkable. No bile duct dilatation. Pancreas: Unremarkable. No pancreatic ductal dilatation or surrounding inflammatory changes.  Spleen: Perhaps mild splenomegaly.  Otherwise unremarkable. Adrenals/Urinary Tract: Adrenal glands appear normal. Kidneys are unremarkable without mass, stone or hydronephrosis. No ureteral or bladder calculi are identified. Bladder is decompressed by Foley catheter. Stomach/Bowel: No dilated large or small bowel loops. Stomach is unremarkable. Vascular/Lymphatic: No acute appearing vascular abnormality. No enlarged lymph nodes are seen. Reproductive: Prostate gland is unremarkable. Other: Moderate to large ascites, similar to the previous CT of 09/18/2020. No new  fluid collection or abscess. No free intraperitoneal air. Musculoskeletal: No acute appearing osseous abnormality. Stable position of the LEFT inguinal approach surgical drain with pigtail terminating at the site of a previously demonstrated fluid collection in the LEFT lower psoas muscle. This collection has resolved in the interval. The additional complex fluid collection within the iliacus muscle is also significantly improved and nearly resolved, perhaps a small residual component overlying the LEFT SI joint (series 5, image 81). Persistent anasarca without significant change. CT LEFT FEMUR FINDINGS: Interval surgical evacuation of the abscess collection in the medial LEFT thigh. No persistent fluid collection is seen, with expected postsurgical changes of the overlying soft tissues and with associated packing in place at the overlying skin defect. Grossly stable abscess collection anterior to the upper LEFT femur, measuring 3.3 cm (series 11, image 150). The larger fluid collection seen at this level on the previous study is no longer identified status post the interval incision and drainage. The drainage catheters have been removed from the upper thigh. No new fluid collection or abscess-like collection is identified within the soft tissues about the LEFT femur. Soft tissues about the LEFT knee are unremarkable. No hemorrhage or contrast  extravasation is seen within the soft tissues about the LEFT femur. Osseous femur itself appears intact and normal in mineralization. No cortical defect, lucency or destructive change to suggest associated osteomyelitis. IMPRESSION: 1. Stable position of the LEFT inguinal approach surgical drain with pigtail terminating at the site of a previously demonstrated fluid collection in the LEFT lower psoas muscle. This psoas muscle collection has resolved in the interval. 2. The additional complex fluid collection within the iliacus muscle is also significantly improved and nearly resolved, perhaps a small residual component overlying the LEFT SI joint. 3. Moderate to large ascites, similar to the previous CT of 09/18/2020. No new fluid collection or abscess. 4. Cirrhotic-appearing liver. 5. Largest abscess collections within the medial aspect of the LEFT thigh are no longer seen status post interval incision and drainage. Expected postsurgical changes of the overlying soft tissues. No evidence of surgical complicating feature. 6. Stable smaller abscess collection anterior to the LEFT femur, measuring 3.3 cm greatest dimension. 7. Osseous femur appears intact and normal in mineralization. No evidence of associated osteomyelitis. Electronically Signed   By: Bary Richard M.D.   On: 09/21/2020 17:42   CT FEMUR LEFT W CONTRAST  Result Date: 09/18/2020 CLINICAL DATA:  Follow up left thigh abscesses following percutaneous drain placement 4 days ago. EXAM: CT OF THE LOWER LEFT EXTREMITY WITH CONTRAST TECHNIQUE: Multidetector CT imaging of the left thigh was performed according to the standard protocol following intravenous contrast administration. CONTRAST:  OMNIPAQUE IOHEXOL 300 MG/ML  SOLN COMPARISON:  Pelvic CT 09/13/2020. Images during drainage procedure 09/14/2020. FINDINGS: Bones/Joint/Cartilage Interval left total hip arthroplasty. The hardware appears well positioned. No evidence of acute fracture or  dislocation. There is no bone destruction. Stable mild degenerative changes of the left sacroiliac joint. No large hip joint effusion. Ligaments Suboptimally assessed by CT. Muscles and Tendons Percutaneous drain within the left psoas muscle is unchanged in position. There is no recurrent fluid collection in the psoas muscle, although there is a residual peripherally enhancing iliacus fluid collection measuring 2.4 cm transverse on image 52/5. Peripherally enhancing fluid tracks inferiorly along the iliopsoas tendon into the anterior aspect of the left thigh. There are 2 percutaneous drains within the proximal to mid left thigh. The more lateral drain is situated within a complex fluid collection within  the vastus lateralis muscle which measures up to 5.3 x 4.8 cm at the level of the drain. This collection is complex with peripheral enhancement and multiple septations. It extends into the distal thigh, at least 21 cm in length on sagittal image 165/9. Some air within the collection is attributed to the drain. The more posteromedial drain is located within a complex fluid collection involving the proximal sartorius muscle. At the level of the drain, this measures approximately 11.0 x 4.1 cm on image 203/5. This fluid collection is also complex with peripheral enhancement. It may communicate with a separate peripherally enhancing fluid collection involving the right gracilis muscle. These medial collections are also quite extensive in length, extending beyond the knee, at least 40 cm in overall length (image 125/9). Based on the images obtained through the left thigh after catheter placement on 09/14/2020, the proximal component of the anteromedial collection has mildly enlarged. Soft tissues There is generalized subcutaneous edema throughout the left thigh. No unexpected foreign body. Pelvic findings are dictated separately. IMPRESSION: 1. Interval placement of left pelvic and two left thigh drains for multiple  abscesses. The pelvic components of the fluid collections have mildly improved, but there are multiple persistent large peripherally enhancing complex fluid collections within the left thigh which are incompletely drained. These are quite extensive in length, extending into the distal thigh. The proximal component of the anteromedial collection has slightly enlarged. 2. No evidence of osteomyelitis or large hip joint effusion. 3. Interval left total hip arthroplasty. The hardware appears well positioned. Electronically Signed   By: Carey Bullocks M.D.   On: 09/18/2020 17:38   DG Pelvis Portable  Result Date: 09/15/2020 CLINICAL DATA:  Status post total left hip arthroplasty. EXAM: PORTABLE PELVIS 1-2 VIEWS COMPARISON:  None. FINDINGS: A left hip replacement is seen without evidence of surrounding lucency. There is no evidence of pelvic fracture or diastasis. No pelvic bone lesions are seen. Radiopaque surgical drains are seen. A mild-to-moderate amount of soft tissue air is seen along the lateral aspect of the left hip. IMPRESSION: Status post left hip replacement. Electronically Signed   By: Aram Candela M.D.   On: 09/15/2020 20:20   CT ASPIRATION  Result Date: 10/03/2020 INDICATION: 34 year old male with history of MSSA bacteremia and left gluteal and thigh abscesses status post multiple drains. Presents with increasing leukocytosis and new left thigh abscess. EXAM: 1. Removal of indwelling left psoas drain. 2. CT-guided aspiration of left iliacus abscess. 3. CT-guided aspiration and drain placement and left anterior thigh abscess. COMPARISON:  CT abdomen pelvis from earlier the same day MEDICATIONS: The patient is currently admitted to the hospital and receiving intravenous antibiotics. The antibiotics were administered within an appropriate time frame prior to the initiation of the procedure. ANESTHESIA/SEDATION: Moderate (conscious) sedation was employed during this procedure. A total of Versed 4  mg and Fentanyl 100 mcg was administered intravenously. Moderate Sedation Time: 30 minutes. The patient's level of consciousness and vital signs were monitored continuously by radiology nursing throughout the procedure under my direct supervision. CONTRAST:  None COMPLICATIONS: None immediate. PROCEDURE: Informed written consent was obtained from the patient after a discussion of the risks, benefits and alternatives to treatment. The patient was placed supine on the CT gantry and a pre procedural CT was performed re-demonstrating the known abscess/fluid collection within the left iliacus muscle and left anterior thigh. The procedure was planned. A timeout was performed prior to the initiation of the procedure. Given findings on CT demonstrating resolution of the  previously visualized psoas abscess, the left psoas drain was removed without complication. The left groin and left anterior thigh was prepped and draped in the usual sterile fashion. The overlying soft tissues were anesthetized with 1% lidocaine with epinephrine at each planned needle entry site. Appropriate trajectory to aspirate the iliacus abscess was planned with the use of a 22 gauge spinal needle. A 15 cm, 18 gauge trocar needle was advanced into the abscess/fluid collection. Manual aspiration yielded approximately 20 mL of purulence fluid. The needle was removed. Hemostasis was obtained at the needle entry site with brief manual compression. Sterile bandage was applied. A small skin nick was made at the left anterolateral thigh needle entry site. A 10 cm, 18 gauge trocar needle was then directed under intermittent CT guidance to the left anterior thigh intramuscular abscess. A short Amplatz super stiff wire was coiled within the collection. Appropriate positioning was confirmed with a limited CT scan. The tract was serially dilated allowing placement of a 12 [place] all-purpose drainage catheter. Appropriate positioning was confirmed with a limited  postprocedural CT scan. Approximately 10 ml of purulent fluid was aspirated. The tube was connected to a bulb suction and sutured in place. A dressing was placed. The patient tolerated the procedure well without immediate post procedural complication. IMPRESSION: 1. Removal of indwelling left psoas drain without complication. 2. Technically successful CT-guided aspiration of left iliacus intramuscular abscess yielding approximately 20 mL of purulent fluid. Sample was sent for culture. 3. Technically successful CT-guided aspiration and drain placement within left anterior thigh intramuscular abscess yielding approximately 10 mL of purulent fluid. The drain was connected to bulb suction. Marliss Coots, MD [facility] Radiology Electronically Signed   By: Marliss Coots MD   On: 10/03/2020 07:59   US Paracentesis  Result Date: 10/05/2020 INDICATION: Patient with history of septic arthritis, staph bacteremia, alcoholic cirrhosis, recurrent ascites. Request is made for therapeutic paracentesis. EXAM: ULTRASOUND GUIDED THERAPEUTIC PARACENTESIS MEDICATIONS: 10 mL 1% lidocaine COMPLICATIONS: None immediate. PROCEDURE: Informed written consent was obtained from the patient after a discussion of the risks, benefits and alternatives to treatment. A timeout was performed prior to the initiation of the procedure. Initial ultrasound scanning demonstrates a moderate amount of ascites within the right lower abdominal quadrant. The right lower abdomen was prepped and draped in the usual sterile fashion. 1% lidocaine was used for local anesthesia. Following this, a 19 gauge, 7-cm, Yueh catheter was introduced. An ultrasound image was saved for documentation purposes. The paracentesis was performed. The catheter was removed and a dressing was applied. The patient tolerated the procedure well without immediate post procedural complication. FINDINGS: A total of approximately 3.5 liters  of pale yellow fluid was removed. IMPRESSION: Successful ultrasound-guided therapeutic paracentesis yielding 3.5 liters of peritoneal fluid. Read by: Loyce Dys PA-C Electronically Signed   By: Gilmer Mor D.O.   On: 10/05/2020 13:36   US Paracentesis  Result Date: 09/30/2020 INDICATION: Patient with history of septic arthritis of the left hip with prior iliopsoas and left proximal and medial thigh abscesses, COVID-19 in January 2022 ,staph bacteremia, alcoholic cirrhosis, ascites. Request received for diagnostic and therapeutic paracentesis up to 3 liters. EXAM: ULTRASOUND GUIDED DIAGNOSTIC AND THERAPEUTIC PARACENTESIS MEDICATIONS: 1% lidocaine to skin and subcutaneous tissue COMPLICATIONS: None immediate. PROCEDURE: Informed written consent was obtained from the patient after a discussion of the risks, benefits and alternatives to treatment. A timeout was performed prior to the initiation of the procedure. Initial ultrasound scanning demonstrates a moderate  to large amount of ascites within the right mid to lower abdominal quadrant. The right mid to lower abdomen was prepped and draped in the usual sterile fashion. 1% lidocaine was used for local anesthesia. Following this, a 19 gauge, 10-cm, Yueh catheter was introduced. An ultrasound image was saved for documentation purposes. The paracentesis was performed. The catheter was removed and a dressing was applied. The patient tolerated the procedure well without immediate post procedural complication. FINDINGS: A total of approximately 3 liters of clear, light yellow fluid was removed. Samples were sent to the laboratory as requested by the clinical team. IMPRESSION: Successful ultrasound-guided diagnostic and therapeutic paracentesis yielding 3 liters of peritoneal fluid. Read by: Jeananne RamaKevin Allred, PA-C Electronically Signed   By: Marliss Cootsylan  Suttle MD   On: 09/30/2020 12:35   DG Chest Port 1 View  Result Date: 09/15/2020 CLINICAL DATA:  Shock. EXAM: PORTABLE  CHEST 1 VIEW COMPARISON:  September 13, 2020 FINDINGS: Mildly decreased lung volumes are seen which is likely secondary to the degree of patient inspiration. Very mild atelectasis and/or early infiltrate is noted within the right lung base. There is no evidence of a pleural effusion or pneumothorax. The heart size and mediastinal contours are within normal limits. The visualized skeletal structures are unremarkable. IMPRESSION: Very mild right basilar atelectasis and/or early infiltrate. Electronically Signed   By: Aram Candelahaddeus  Houston M.D.   On: 09/15/2020 20:20   ECHOCARDIOGRAM COMPLETE  Result Date: 09/14/2020    ECHOCARDIOGRAM REPORT   Patient Name:   George GoldsMATTHEW V Pavlak Date of Exam: 09/14/2020 Medical Rec #:  [id number]         Height:       70.0 in Accession #:    [id number]        Weight:       174.0 lb Date of Birth:  October 20, 1985         BSA:          1.967 m Patient Age:    34 years          BP:           116/82 mmHg Patient Gender: M                 HR:           118 bpm. Exam Location:  Inpatient Procedure: 2D Echo, Cardiac Doppler and Color Doppler Indications:    Bacteremia; Z01.818 Encounter for other preprocedural                 examination  History:        Patient has prior history of Echocardiogram examinations, most                 recent 08/25/2020. Abnormal ECG, Signs/Symptoms:Shortness of                 Breath and Dyspnea; Risk Factors:Hypertension. ETOH. Covid                 infection.  Sonographer:    Sheralyn Boatmanina West RDCS Referring Phys: [id number] [facility] InstituteMA[facility] LATIF [facility]  Sonographer Comments: Technically difficult study due to poor echo windows. Patient has hip infection, in pain, could not move. High fowler's position. IMPRESSIONS  1. Left ventricular ejection fraction, by estimation, is 60 to 65%. The left ventricle has normal function. The left ventricle has no regional wall motion abnormalities. There is moderate concentric left ventricular hypertrophy. Left ventricular diastolic function could not be  evaluated. Elevated left ventricular end-diastolic pressure.  2. Right ventricular systolic function is normal. The right ventricular size is normal. Tricuspid regurgitation signal is inadequate for assessing PA pressure.  3. The mitral valve is normal in structure. No evidence of mitral valve regurgitation. No evidence of mitral stenosis.  4. The aortic valve is normal in structure. Aortic valve regurgitation is not visualized. No aortic stenosis is present.  5. The inferior vena cava is normal in size with greater than 50% respiratory variability, suggesting right atrial pressure of 3 mmHg.  6. Trivial pericardial effusion is present. The pericardial effusion is circumferential. FINDINGS  Left Ventricle: Left ventricular ejection fraction, by estimation, is 60 to 65%. The left ventricle has normal function. The left ventricle has no regional wall motion abnormalities. The left ventricular internal cavity size was normal in size. There is  moderate concentric left ventricular hypertrophy. Left ventricular diastolic function could not be evaluated. Elevated left ventricular end-diastolic pressure. Right Ventricle: The right ventricular size is normal. No increase in right ventricular wall thickness. Right ventricular systolic function is normal. Tricuspid regurgitation signal is inadequate for assessing PA pressure. Left Atrium: Left atrial size was normal in size. Right Atrium: Right atrial size was normal in size. Pericardium: Trivial pericardial effusion is present. The pericardial effusion is circumferential. Mitral Valve: The mitral valve is normal in structure. No evidence of mitral valve regurgitation. No evidence of mitral valve stenosis. Tricuspid Valve: The tricuspid valve is normal in structure. Tricuspid valve regurgitation is mild . No evidence of tricuspid stenosis. Aortic Valve: The aortic valve is normal in structure. Aortic valve regurgitation is not visualized. No aortic stenosis is present.  Pulmonic Valve: The pulmonic valve was normal in structure. Pulmonic valve regurgitation is not visualized. No evidence of pulmonic stenosis. Aorta: The aortic root is normal in size and structure. Venous: The inferior vena cava is normal in size with greater than 50% respiratory variability, suggesting right atrial pressure of 3 mmHg. IAS/Shunts: No atrial level shunt detected by color flow Doppler.  LEFT VENTRICLE PLAX 2D LVIDd:         4.70 cm      Diastology LVIDs:         2.80 cm      LV e' medial:    3.81 cm/s LV PW:         1.50 cm      LV E/e' medial:  31.0 LV IVS:        1.40 cm      LV e' lateral:   6.64 cm/s LVOT diam:     2.30 cm      LV E/e' lateral: 17.8 LV SV:         78 LV SV Index:   39 LVOT Area:     4.15 cm  LV Volumes (MOD) LV vol d, MOD A2C: 69.7 ml LV vol d, MOD A4C: 115.0 ml LV vol s, MOD A2C: 25.9 ml LV vol s, MOD A4C: 47.1 ml LV SV MOD A2C:     43.8 ml LV SV MOD A4C:     115.0 ml LV SV MOD BP:      51.8 ml IVC IVC diam: 1.50 cm LEFT ATRIUM           Index       RIGHT ATRIUM           Index LA diam:      3.80 cm 1.93 cm/m  RA Area:     12.30 cm LA Vol (A2C): 39.5 ml 20.08 ml/m  RA Volume:   23.60 ml  12.00 ml/m LA Vol (A4C): 32.6 ml 16.57 ml/m  AORTIC VALVE LVOT Vmax:   130.00 cm/s LVOT Vmean:  86.000 cm/s LVOT VTI:    0.187 m  AORTA Ao Root diam: 3.30 cm Ao Asc diam:  3.20 cm MITRAL VALVE MV Area (PHT): 5.84 cm     SHUNTS MV Decel Time: 130 msec     Systemic VTI:  0.19 m MV E velocity: 118.00 cm/s  Systemic Diam: 2.30 cm Armanda Magic MD Electronically signed by Armanda Magic MD Signature Date/Time: 09/14/2020/4:26:40 PM    Final    ECHO TEE  Result Date: 09/19/2020    TRANSESOPHOGEAL ECHO REPORT   Patient Name:   [name] Date of Exam: 09/19/2020 Medical Rec #:  [id number]         Height:       70.0 in Accession #:    [id number]        Weight:       215.6 lb Date of Birth:  [birth year]         BSA:          2.155 m Patient Age:    34 years          BP:           106/66 mmHg  Patient Gender: M                 HR:           104 bpm. Exam Location:  Inpatient Procedure: Transesophageal Echo, Color Doppler and Saline Contrast Bubble Study Indications:     Bacteremia  History:         Patient has prior history of Echocardiogram examinations, most                  recent 09/14/2020. Signs/Symptoms:Shortness of Breath; Risk                  Factors:Hypertension. ETOH.  Sonographer:     Ross Ludwig RDCS (AE) Referring Phys:  [address] [place] Diagnosing Phys: Dietrich Pates MD PROCEDURE: After discussion of the risks and benefits of a TEE, an informed consent was obtained from the patient. The transesophogeal probe was passed without difficulty through the esophogus of the patient. Local oropharyngeal anesthetic was provided with Cetacaine. Sedation performed by different physician. The patient was monitored while under deep sedation. Anesthestetic sedation was provided intravenously by Anesthesiology: 227.23mg  of Propofol. Image quality was good. The patient developed no complications during the procedure. IMPRESSIONS  1. No obvious vegetations.  2. Left ventricular ejection fraction, by estimation, is 60 to 65%. The left ventricle has normal function.  3. Right ventricular systolic function is normal. The right ventricular size is normal.  4. No left atrial/left atrial appendage thrombus was detected.  5. The mitral valve is normal in structure. Trivial mitral valve regurgitation.  6. The aortic valve is normal in structure. Aortic valve regurgitation is not visualized.  7. Agitated saline contrast bubble study was negative, with no evidence of any interatrial shunt. FINDINGS  Left Ventricle: Left ventricular ejection fraction, by estimation, is 60 to 65%. The left ventricle has normal function. The left ventricular internal cavity size was normal in size. Right Ventricle: The right ventricular size is normal. Right ventricular systolic function is normal. Left Atrium: Left atrial size was normal  in size. No left atrial/left atrial appendage thrombus was detected. Right Atrium: Right atrial size was normal in  size. Pericardium: There is no evidence of pericardial effusion. Mitral Valve: The mitral valve is normal in structure. Trivial mitral valve regurgitation. Tricuspid Valve: The tricuspid valve is normal in structure. Tricuspid valve regurgitation is trivial. Aortic Valve: The aortic valve is normal in structure. Aortic valve regurgitation is not visualized. Pulmonic Valve: The pulmonic valve was normal in structure. Pulmonic valve regurgitation is not visualized. Aorta: The aortic root and ascending aorta are structurally normal, with no evidence of dilitation. IAS/Shunts: No atrial level shunt detected by color flow Doppler. Agitated saline contrast was given intravenously to evaluate for intracardiac shunting. Agitated saline contrast bubble study was negative, with no evidence of any interatrial shunt. Dietrich Pates MD Electronically signed by Dietrich Pates MD Signature Date/Time: 09/19/2020/4:02:21 PM    Final    [place] ASCITES (ABDOMEN LIMITED)  Result Date: 10/09/2020 CLINICAL DATA:  History of alcoholic cirrhosis, recurrent symptomatic abdominal ascites, post paracentesis most recently 10/05/2020. EXAM: LIMITED ABDOMEN ULTRASOUND FOR ASCITES TECHNIQUE: Limited ultrasound survey for ascites was performed in all four abdominal quadrants. COMPARISON:  10/05/2020 FINDINGS: Small volume perihepatic and scattered abdominal ascites. No dominant pocket for safe therapeutic paracentesis. IMPRESSION: Small volume ascites.  Paracentesis deferred. Electronically Signed   By: Corlis Leak M.D.   On: 10/09/2020 14:36   US SCROTUM W/DOPPLER  Result Date: 09/17/2020 CLINICAL DATA:  Left scrotal pain and swelling for several days. EXAM: SCROTAL ULTRASOUND DOPPLER ULTRASOUND OF THE TESTICLES TECHNIQUE: Complete ultrasound examination of the testicles, epididymis, and other scrotal structures was performed. Color and  spectral Doppler ultrasound were also utilized to evaluate blood flow to the testicles. COMPARISON:  CT on 09/13/2020 FINDINGS: Right testicle Measurements: 3.2 x 1.8 x 2.4 cm. No mass or microlithiasis visualized. Left testicle Measurements: 3.4 x 2.1 x 2.0 cm. No mass or microlithiasis visualized. Right epididymis:  Normal in size and appearance. Left epididymis:  Normal in size and appearance. Hydrocele:  None visualized. Varicocele:  None visualized. Pulsed Doppler interrogation of both testes demonstrates normal low resistance arterial and venous waveforms bilaterally. Marked asymmetric left scrotal wall thickening and fluid is seen, with increased blood flow on color Doppler ultrasound. This appears to be part of the much more widespread process throughout the left pelvic and thigh soft tissues seen, as demonstrated on prior CT. IMPRESSION: No evidence of testicular mass or torsion. Marked asymmetric left scrotal wall thickening and fluid, with increased blood flow. This is likely due to soft tissue infection, and part of the much more widespread process in the left pelvis and thigh demonstrated on prior CT. Electronically Signed   By: Danae Orleans M.D.   On: 09/17/2020 18:47   DG Hip Port Unilat With Pelvis 1V Left  Result Date: 09/15/2020 CLINICAL DATA:  Status post left hip arthroplasty. EXAM: DG HIP (WITH OR WITHOUT PELVIS) 1V PORT LEFT COMPARISON:  None. FINDINGS: A left hip replacement is seen without evidence of surrounding lucency. There is no evidence of acute hip fracture or dislocation. There is no evidence of arthropathy or other focal bone abnormality. Multiple radiopaque surgical drains are seen. Radiopaque skin staples are also noted. IMPRESSION: Left hip replacement without evidence of hardware complication or fracture. Electronically Signed   By: Aram Candela M.D.   On: 09/15/2020 20:21   DG HIP UNILAT WITH PELVIS 2-3 VIEWS LEFT  Result Date: 10/04/2020 CLINICAL DATA:  Status post  left hip replacement on September 15, 2020. EXAM: DG HIP (WITH OR WITHOUT PELVIS) 2-3V LEFT COMPARISON:  September 15, 2020 FINDINGS: There  is no evidence of acute hip fracture or dislocation. A left hip replacement is seen without evidence of surrounding lucency to suggest the presence of hardware loosening or infection. There is no evidence of arthropathy or other focal bone abnormality. A solitary surgical drain is seen within soft tissues adjacent to the lateral aspect of the proximal left femoral shaft. Soft tissue swelling is seen along the lateral aspect of the left hip. Multiple radiopaque skin staples are also seen. IMPRESSION: 1. Status post left hip replacement without evidence of hardware loosening or infection. 2. Surgical drain positioning, as described above. Electronically Signed   By: Aram Candela M.D.   On: 10/04/2020 19:56   CT IMAGE GUIDED FLUID DRAIN BY CATHETER  Result Date: 09/14/2020 INDICATION: Septic arthritis affecting left hip now with complex retroperitoneal and left thigh collections. Please perform image guided drainage catheter placement for infection source control purposes prior to definitive operative debridement EXAM: ULTRASOUND AND CT-GUIDED PERCUTANEOUS DRAINAGE CATHETER PLACEMENT X3 COMPARISON:  CT abdomen pelvis-09/13/2020 MEDICATIONS: The patient is currently admitted to the hospital and receiving intravenous antibiotics. The antibiotics were administered within an appropriate time frame prior to the initiation of the procedure. ANESTHESIA/SEDATION: Moderate (conscious) sedation was employed during this procedure. A total of Versed 6 mg and Fentanyl 200 mcg was administered intravenously. Moderate Sedation Time: 50 minutes. The patient's level of consciousness and vital signs were monitored continuously by radiology nursing throughout the procedure under my direct supervision. CONTRAST:  None COMPLICATIONS: None immediate. PROCEDURE: Informed written consent was  obtained from the patient after a discussion of the risks, benefits and alternatives to treatment. The patient was placed supine on the CT gantry and a pre procedural CT was performed re-demonstrating the known abscess/fluid collection within the left retroperitoneal space as well as ill-defined fluid collections involving the medial and lateral aspects of thigh. The collections were identified sonographically and the procedure was planned. A timeout was performed prior to the initiation of the procedure. The skin overlying the anterior aspect of the left groin and thigh were prepped and draped in the usual sterile fashion. The overlying soft tissues were anesthetized with 1% lidocaine with epinephrine. Under direct ultrasound guidance, the retroperitoneal collection was accessed at the level of the left groin directed in a caudal to cranial trajectory with an 18 gauge trocar needle. Short Amplatz wire was coiled within collection. Next, both the medial and lateral collections within the anterior thigh were also accessed with 18 gauge trocar needles, also in a caudal to cranial trajectory with short Amplatz wires were coiled within both collections. Multiple ultrasound images were saved for procedural documentation purposes. Appropriate position was confirmed with CT imaging Next, tracks were dilated allowing placement of a 14 French drainage catheter within the retroperitoneal collection and 12 French drainage catheters within the medial and lateral anterior thigh collections. Appropriate position was confirmed with CT imaging. After drainage catheter placement, approximately 1.3 L of purulent fluid was aspirated from all collections. A small sample of aspirated fluid was capped and sent to the laboratory for analysis. Postprocedural CT imaging was performed demonstrating significant reduction in size of complex fluid collections (series 6). The drainage catheters were connected to gravity bags and sutured in  place. Dressings were applied. The patient tolerated the procedure well without immediate post procedural complication. IMPRESSION: Successful ultrasound and CT guided placement of a 93 French all purpose drain catheter into the retroperitoneal abscess with two 12 [place] all-purpose drainage catheter was placed into the medial and lateral anterior  left thigh abscesses. A total of 1.3 L of purulent fluid was aspirated from all collections. A representative sample of aspirated fluid was sent to the laboratory as requested by the ordering clinical team. Electronically Signed   By: Simonne Come M.D.   On: 09/14/2020 13:56   CT IMAGE GUIDED FLUID DRAIN BY CATHETER  Result Date: 09/14/2020 INDICATION: Septic arthritis affecting left hip now with complex retroperitoneal and left thigh collections. Please perform image guided drainage catheter placement for infection source control purposes prior to definitive operative debridement EXAM: ULTRASOUND AND CT-GUIDED PERCUTANEOUS DRAINAGE CATHETER PLACEMENT X3 COMPARISON:  CT abdomen pelvis-09/13/2020 MEDICATIONS: The patient is currently admitted to the hospital and receiving intravenous antibiotics. The antibiotics were administered within an appropriate time frame prior to the initiation of the procedure. ANESTHESIA/SEDATION: Moderate (conscious) sedation was employed during this procedure. A total of Versed 6 mg and Fentanyl 200 mcg was administered intravenously. Moderate Sedation Time: 50 minutes. The patient's level of consciousness and vital signs were monitored continuously by radiology nursing throughout the procedure under my direct supervision. CONTRAST:  None COMPLICATIONS: None immediate. PROCEDURE: Informed written consent was obtained from the patient after a discussion of the risks, benefits and alternatives to treatment. The patient was placed supine on the CT gantry and a pre procedural CT was performed re-demonstrating the known abscess/fluid collection  within the left retroperitoneal space as well as ill-defined fluid collections involving the medial and lateral aspects of thigh. The collections were identified sonographically and the procedure was planned. A timeout was performed prior to the initiation of the procedure. The skin overlying the anterior aspect of the left groin and thigh were prepped and draped in the usual sterile fashion. The overlying soft tissues were anesthetized with 1% lidocaine with epinephrine. Under direct ultrasound guidance, the retroperitoneal collection was accessed at the level of the left groin directed in a caudal to cranial trajectory with an 18 gauge trocar needle. Short Amplatz wire was coiled within collection. Next, both the medial and lateral collections within the anterior thigh were also accessed with 18 gauge trocar needles, also in a caudal to cranial trajectory with short Amplatz wires were coiled within both collections. Multiple ultrasound images were saved for procedural documentation purposes. Appropriate position was confirmed with CT imaging Next, tracks were dilated allowing placement of a 14 French drainage catheter within the retroperitoneal collection and 12 French drainage catheters within the medial and lateral anterior thigh collections. Appropriate position was confirmed with CT imaging. After drainage catheter placement, approximately 1.3 L of purulent fluid was aspirated from all collections. A small sample of aspirated fluid was capped and sent to the laboratory for analysis. Postprocedural CT imaging was performed demonstrating significant reduction in size of complex fluid collections (series 6). The drainage catheters were connected to gravity bags and sutured in place. Dressings were applied. The patient tolerated the procedure well without immediate post procedural complication. IMPRESSION: Successful ultrasound and CT guided placement of a 4 French all purpose drain catheter into the  retroperitoneal abscess with two 12 [place] all-purpose drainage catheter was placed into the medial and lateral anterior left thigh abscesses. A total of 1.3 L of purulent fluid was aspirated from all collections. A representative sample of aspirated fluid was sent to the laboratory as requested by the ordering clinical team. Electronically Signed   By: Simonne Come M.D.   On: 09/14/2020 13:56   CT IMAGE GUIDED FLUID DRAIN BY CATHETER  Result Date: 09/14/2020 INDICATION: Septic arthritis affecting left hip  now with complex retroperitoneal and left thigh collections. Please perform image guided drainage catheter placement for infection source control purposes prior to definitive operative debridement EXAM: ULTRASOUND AND CT-GUIDED PERCUTANEOUS DRAINAGE CATHETER PLACEMENT X3 COMPARISON:  CT abdomen pelvis-09/13/2020 MEDICATIONS: The patient is currently admitted to the hospital and receiving intravenous antibiotics. The antibiotics were administered within an appropriate time frame prior to the initiation of the procedure. ANESTHESIA/SEDATION: Moderate (conscious) sedation was employed during this procedure. A total of Versed 6 mg and Fentanyl 200 mcg was administered intravenously. Moderate Sedation Time: 50 minutes. The patient's level of consciousness and vital signs were monitored continuously by radiology nursing throughout the procedure under my direct supervision. CONTRAST:  None COMPLICATIONS: None immediate. PROCEDURE: Informed written consent was obtained from the patient after a discussion of the risks, benefits and alternatives to treatment. The patient was placed supine on the CT gantry and a pre procedural CT was performed re-demonstrating the known abscess/fluid collection within the left retroperitoneal space as well as ill-defined fluid collections involving the medial and lateral aspects of thigh. The collections were identified sonographically and the procedure was planned. A timeout was  performed prior to the initiation of the procedure. The skin overlying the anterior aspect of the left groin and thigh were prepped and draped in the usual sterile fashion. The overlying soft tissues were anesthetized with 1% lidocaine with epinephrine. Under direct ultrasound guidance, the retroperitoneal collection was accessed at the level of the left groin directed in a caudal to cranial trajectory with an 18 gauge trocar needle. Short Amplatz wire was coiled within collection. Next, both the medial and lateral collections within the anterior thigh were also accessed with 18 gauge trocar needles, also in a caudal to cranial trajectory with short Amplatz wires were coiled within both collections. Multiple ultrasound images were saved for procedural documentation purposes. Appropriate position was confirmed with CT imaging Next, tracks were dilated allowing placement of a 14 French drainage catheter within the retroperitoneal collection and 12 French drainage catheters within the medial and lateral anterior thigh collections. Appropriate position was confirmed with CT imaging. After drainage catheter placement, approximately 1.3 L of purulent fluid was aspirated from all collections. A small sample of aspirated fluid was capped and sent to the laboratory for analysis. Postprocedural CT imaging was performed demonstrating significant reduction in size of complex fluid collections (series 6). The drainage catheters were connected to gravity bags and sutured in place. Dressings were applied. The patient tolerated the procedure well without immediate post procedural complication. IMPRESSION: Successful ultrasound and CT guided placement of a 54 French all purpose drain catheter into the retroperitoneal abscess with two 12 [place] all-purpose drainage catheter was placed into the medial and lateral anterior left thigh abscesses. A total of 1.3 L of purulent fluid was aspirated from all collections. A representative  sample of aspirated fluid was sent to the laboratory as requested by the ordering clinical team. Electronically Signed   By: Simonne Come M.D.   On: 09/14/2020 13:56   CT IMAGE GUIDED DRAINAGE BY PERCUTANEOUS CATHETER  Result Date: 10/03/2020 INDICATION: 35 year old male with history of MSSA bacteremia and left gluteal and thigh abscesses status post multiple drains. Presents with increasing leukocytosis and new left thigh abscess. EXAM: 1. Removal of indwelling left psoas drain. 2. CT-guided aspiration of left iliacus abscess. 3. CT-guided aspiration and drain placement and left anterior thigh abscess. COMPARISON:  CT abdomen pelvis from earlier the same day MEDICATIONS: The patient is currently admitted to the hospital and  receiving intravenous antibiotics. The antibiotics were administered within an appropriate time frame prior to the initiation of the procedure. ANESTHESIA/SEDATION: Moderate (conscious) sedation was employed during this procedure. A total of Versed 4 mg and Fentanyl 100 mcg was administered intravenously. Moderate Sedation Time: 30 minutes. The patient's level of consciousness and vital signs were monitored continuously by radiology nursing throughout the procedure under my direct supervision. CONTRAST:  None COMPLICATIONS: None immediate. PROCEDURE: Informed written consent was obtained from the patient after a discussion of the risks, benefits and alternatives to treatment. The patient was placed supine on the CT gantry and a pre procedural CT was performed re-demonstrating the known abscess/fluid collection within the left iliacus muscle and left anterior thigh. The procedure was planned. A timeout was performed prior to the initiation of the procedure. Given findings on CT demonstrating resolution of the previously visualized psoas abscess, the left psoas drain was removed without complication. The left groin and left anterior thigh was prepped and draped in the usual sterile fashion. The  overlying soft tissues were anesthetized with 1% lidocaine with epinephrine at each planned needle entry site. Appropriate trajectory to aspirate the iliacus abscess was planned with the use of a 22 gauge spinal needle. A 15 cm, 18 gauge trocar needle was advanced into the abscess/fluid collection. Manual aspiration yielded approximately 20 mL of purulence fluid. The needle was removed. Hemostasis was obtained at the needle entry site with brief manual compression. Sterile bandage was applied. A small skin nick was made at the left anterolateral thigh needle entry site. A 10 cm, 18 gauge trocar needle was then directed under intermittent CT guidance to the left anterior thigh intramuscular abscess. A short Amplatz super stiff wire was coiled within the collection. Appropriate positioning was confirmed with a limited CT scan. The tract was serially dilated allowing placement of a 12 [place] all-purpose drainage catheter. Appropriate positioning was confirmed with a limited postprocedural CT scan. Approximately 10 ml of purulent fluid was aspirated. The tube was connected to a bulb suction and sutured in place. A dressing was placed. The patient tolerated the procedure well without immediate post procedural complication. IMPRESSION: 1. Removal of indwelling left psoas drain without complication. 2. Technically successful CT-guided aspiration of left iliacus intramuscular abscess yielding approximately 20 mL of purulent fluid. Sample was sent for culture. 3. Technically successful CT-guided aspiration and drain placement within left anterior thigh intramuscular abscess yielding approximately 10 mL of purulent fluid. The drain was connected to bulb suction. Marliss Coots, MD [facility] Radiology Electronically Signed   By: Marliss Coots MD   On: 10/03/2020 07:59   CT IMAGE GUIDED DRAINAGE BY PERCUTANEOUS CATHETER  Result Date: 09/23/2020 INDICATION: Septic arthritis of  the left hip status post multiple IR drain placement. Persistent fluid collection present in the left mid thigh. Interventional radiology consulted for drain placement. EXAM: CT GUIDED DRAINAGE OF LEFT MID THIGH ABSCESS MEDICATIONS: The patient is currently admitted to the hospital and receiving intravenous antibiotics. The antibiotics were administered within an appropriate time frame prior to the initiation of the procedure. ANESTHESIA/SEDATION: 4 mg IV Versed 100 mcg IV Fentanyl Moderate Sedation Time:  23 The patient was continuously monitored during the procedure by the interventional radiology nurse under my direct supervision. COMPLICATIONS: None immediate. TECHNIQUE: Informed written consent was obtained from the patient after a thorough discussion of the procedural risks, benefits and alternatives. All questions were addressed. Maximal Sterile Barrier Technique was utilized including caps, mask, sterile gowns, sterile  gloves, sterile drape, hand hygiene and skin antiseptic. A timeout was performed prior to the initiation of the procedure. PROCEDURE: The left mid thigh was prepped with Chlorhexidine in a sterile fashion, and a sterile drape was applied covering the operative field. A sterile gown and sterile gloves were used for the procedure. Local anesthesia was provided with 1% Lidocaine. Patient positioned supine on the procedure table. Following local lidocaine the striation, 18 gauge trocar needle advanced into the left mid thigh fluid collection utilizing CT guidance. The 18 gauge needle was exchanged for 10.2 [place] multipurpose pigtail drain over 0.035 inch guidewire. Minimal serosanguineous output from the drain. Drain was secured to skin with suture and connected to bulb suction. The patient was transferred to the ultrasound department. Ultrasound examination confirmed appropriate positioning of the drain within the left anterior mid thigh collection. The collection appeared quite heterogeneous on  ultrasound examination which explains the minimal output from the drain. FINDINGS: Contrast-enhanced CT of the left mid thigh demonstrated rim enhancing collection in the anterior compartment. IMPRESSION: 10.2 [place] multipurpose pigtail drain placed in left mid thigh collection. Position of drain confirmed both with CT and ultrasound. On ultrasound evaluation the collection appears quite complex which explains the minimal drain output. Electronically Signed   By: Acquanetta Belling M.D.   On: 09/23/2020 16:34   VAS [place] LOWER EXTREMITY VENOUS (DVT)  Result Date: 09/28/2020  Lower Venous DVT Study Indications: Possible injury to blood vessels per order in chart.  Limitations: Bandages and poor ultrasound/tissue interface. Comparison Study: no previous exams Performing Technologist: Ernestene Mention  Examination Guidelines: A complete evaluation includes B-mode imaging, spectral Doppler, color Doppler, and power Doppler as needed of all accessible portions of each vessel. Bilateral testing is considered an integral part of a complete examination. Limited examinations for reoccurring indications may be performed as noted. The reflux portion of the exam is performed with the patient in reverse Trendelenburg.  +-----+---------------+---------+-----------+----------+--------------+ RIGHTCompressibilityPhasicitySpontaneityPropertiesThrombus Aging +-----+---------------+---------+-----------+----------+--------------+ CFV  Full           Yes      Yes                                 +-----+---------------+---------+-----------+----------+--------------+   +---------+---------------+---------+-----------+----------+--------------+ LEFT     CompressibilityPhasicitySpontaneityPropertiesThrombus Aging +---------+---------------+---------+-----------+----------+--------------+ FV DistalFull           Yes      Yes                                  +---------+---------------+---------+-----------+----------+--------------+ POP      Full           Yes      Yes                                 +---------+---------------+---------+-----------+----------+--------------+ PTV      Full                                                        +---------+---------------+---------+-----------+----------+--------------+ PERO     Full                                                        +---------+---------------+---------+-----------+----------+--------------+  Left Technical Findings: Not visualized segments include CFV, PFV, Prox & Mid FV, SFJ. Patient just underwent I & D of large left thigh abscess yesterday. Bandaged from groin to distal thigh - unable to remove. Limited study.   Summary: RIGHT: - No evidence of common femoral vein obstruction.  LEFT: - There is no evidence of deep vein thrombosis in the lower extremity. However, portions of this examination were limited- see technologist comments above.  - No cystic structure found in the popliteal fossa.  *See table(s) above for measurements and observations. Electronically signed by Coral Else MD on 09/28/2020 at 10:06:38 PM.    Final    [place] EKG SITE RITE  Result Date: 10/08/2020 If Site Rite image not attached, placement could not be confirmed due to current cardiac rhythm.     Subjective: No concerns today. Swelling continues to improve.  Discharge Exam: Vitals:   10/10/20 0528 10/10/20 0912  BP: 102/68 108/75  Pulse: 98 (!) 102  Resp: 18   Temp: 98.1 F (36.7 C) 97.7 F (36.5 C)  SpO2: 95% 98%   Vitals:   10/09/20 2139 10/10/20 0217 10/10/20 0528 10/10/20 0912  BP: 113/73 100/68 102/68 108/75  Pulse: (!) 109 93 98 (!) 102  Resp: 18 18 18    Temp: 98.5 F (36.9 C) 98.5 F (36.9 C) 98.1 F (36.7 C) 97.7 F (36.5 C)  TempSrc: Oral Oral Oral Oral  SpO2: 93% 94% 95% 98%  Weight:      Height:        General: Pt is alert, awake, not in acute  distress Cardiovascular: regular rhythm, slight tachycardia, S1/S2 +, no rubs, no gallops Respiratory: CTA bilaterally, no wheezing, no rhonchi Abdominal: Soft, NT, ND, bowel sounds + Extremities: generalized edema, no cyanosis    The results of significant diagnostics from this hospitalization (including imaging, microbiology, ancillary and laboratory) are listed below for reference.     Microbiology: Recent Results (from the past 240 hour(s))  Body fluid culture w Gram Stain     Status: None   Collection Time: 09/30/20 12:55 PM   Specimen: Peritoneal Washings; Body Fluid  Result Value Ref Range Status   Specimen Description   Final    PERITONEAL Performed at [facility], [address], [place], [place] [address]    Special Requests   Final    Normal Performed at [facility], [address], [place], [place] [address]    Gram Stain NO WBC SEEN NO ORGANISMS SEEN   Final   Culture   Final    NO GROWTH 3 DAYS Performed at [facility], [address] [place], [place] [address]    Report Status 10/04/2020 FINAL  Final  Aerobic/Anaerobic Culture (surgical/deep wound)     Status: None   Collection Time: 09/30/20  4:56 PM   Specimen: Abscess  Result Value Ref Range Status   Specimen Description   Final    ABSCESS THIGH Performed at [facility] [address] [place], [place] [address]    Special Requests   Final    NONE Performed at [facility], [address], [place], [place] [address]    Gram Stain   Final    ABUNDANT WBC PRESENT,BOTH PMN AND MONONUCLEAR ABUNDANT GRAM POSITIVE COCCI IN PAIRS    Culture   Final    RARE STAPHYLOCOCCUS AUREUS NO ANAEROBES ISOLATED Performed at [facility], [address], [place], [place] [address]  Report Status 10/05/2020 FINAL  Final   Organism ID, Bacteria STAPHYLOCOCCUS AUREUS  Final      Susceptibility   Staphylococcus aureus -  MIC*    CIPROFLOXACIN <=0.5 SENSITIVE Sensitive     ERYTHROMYCIN <=0.25 SENSITIVE Sensitive     GENTAMICIN <=0.5 SENSITIVE Sensitive     OXACILLIN <=0.25 SENSITIVE Sensitive     TETRACYCLINE <=1 SENSITIVE Sensitive     VANCOMYCIN 1 SENSITIVE Sensitive     TRIMETH/SULFA <=10 SENSITIVE Sensitive     CLINDAMYCIN <=0.25 SENSITIVE Sensitive     RIFAMPIN >=32 RESISTANT Resistant     Inducible Clindamycin NEGATIVE Sensitive     * RARE STAPHYLOCOCCUS AUREUS  Culture, blood (routine x 2)     Status: None   Collection Time: 10/01/20  2:29 PM   Specimen: BLOOD RIGHT HAND  Result Value Ref Range Status   Specimen Description   Final    BLOOD RIGHT HAND Performed at [facility], [address], [place], [place] [address]    Special Requests   Final    BOTTLES DRAWN AEROBIC ONLY Blood Culture adequate volume Performed at [facility], [address] [place], [place] [address]    Culture   Final    NO GROWTH 5 DAYS Performed at [facility], [address], [place], [place] [address]    Report Status 10/06/2020 FINAL  Final  Culture, blood (routine x 2)     Status: None   Collection Time: 10/01/20  2:29 PM   Specimen: BLOOD RIGHT FOREARM  Result Value Ref Range Status   Specimen Description   Final    BLOOD RIGHT FOREARM Performed at [facility], [address] [place], [place] [address]    Special Requests   Final    BOTTLES DRAWN AEROBIC ONLY Blood Culture adequate volume Performed at [facility], [address], [place], [place] [address]    Culture   Final    NO GROWTH 5 DAYS Performed at [facility], [address] [place], [place] [address]    Report Status 10/06/2020 FINAL  Final     Labs: BNP (last 3 results) No results for input(s): BNP in the last 8760 hours. Basic Metabolic Panel: Recent Labs  Lab 10/04/20 0516 10/05/20 0446 10/06/20 0927 10/07/20 0444 10/08/20 0450   NA 126* 124* 127* 129* 130*  K 3.9 3.7 3.9 4.1 3.6  CL 94* 96* 96* 97* 98  CO2 GLUCOSE 91 91 93 90 86  BUN CREATININE 0.32* <0.30* <0.30* 0.33* 0.31*  CALCIUM 7.8* 7.7* 7.6* 7.6* 7.7*  MG 1.7 1.6*  --   --   --   PHOS 4.0 4.3  --   --   --    Liver Function Tests: Recent Labs  Lab 10/04/20 0516 10/05/20 0446 10/06/20 0927 10/07/20 0444 10/08/20 0450  AST 33 31 49* 30 26  ALT ALKPHOS 153* 148* 151* 149* 140*  BILITOT 0.9 0.9 1.3* 0.9 0.7  PROT 5.1* 5.1* 5.3* 5.4* 5.1*  ALBUMIN 1.9* 1.9* 1.9* 1.9* 1.7*   No results for input(s): LIPASE, AMYLASE in the last 168 hours. No results for input(s): AMMONIA in the last 168 hours. CBC: Recent Labs  Lab 10/04/20 0516 10/05/20 0446 10/07/20 0444 10/10/20 0920  WBC 13.1* 13.1* 14.0* 12.4*  NEUTROABS 8.9* 8.9*  --   --  HGB 7.8* 7.7* 8.6* 8.6*  HCT 24.5* 24.1* 27.1* 26.9*  MCV 101.2* 101.7* 103.0* 102.3*  PLT 223 234 279 277   Cardiac Enzymes: No results for input(s): CKTOTAL, CKMB, CKMBINDEX, TROPONINI in the last 168 hours. BNP: Invalid input(s): POCBNP CBG: No results for input(s): GLUCAP in the last 168 hours. D-Dimer No results for input(s): DDIMER in the last 72 hours. Hgb A1c No results for input(s): HGBA1C in the last 72 hours. Lipid Profile No results for input(s): CHOL, HDL, LDLCALC, TRIG, CHOLHDL, LDLDIRECT in the last 72 hours. Thyroid function studies No results for input(s): TSH, T4TOTAL, T3FREE, THYROIDAB in the last 72 hours.  Invalid input(s): FREET3 Anemia work up No results for input(s): VITAMINB12, FOLATE, FERRITIN, TIBC, IRON, RETICCTPCT in the last 72 hours. Urinalysis    Component Value Date/Time   COLORURINE AMBER (A) 08/23/2020 1250   APPEARANCEUR TURBID (A) 08/23/2020 1250   LABSPEC 1.019 08/23/2020 1250   PHURINE 5.0 08/23/2020 1250   GLUCOSEU NEGATIVE 08/23/2020 1250   HGBUR NEGATIVE 08/23/2020 1250   BILIRUBINUR SMALL (A) 08/23/2020 1250    KETONESUR NEGATIVE 08/23/2020 1250   PROTEINUR 30 (A) 08/23/2020 1250   UROBILINOGEN 0.2 07/18/2011 0413   NITRITE NEGATIVE 08/23/2020 1250   LEUKOCYTESUR NEGATIVE 08/23/2020 1250   Sepsis Labs Invalid input(s): PROCALCITONIN,  WBC,  LACTICIDVEN Microbiology Recent Results (from the past 240 hour(s))  Body fluid culture w Gram Stain     Status: None   Collection Time: 09/30/20 12:55 PM   Specimen: Peritoneal Washings; Body Fluid  Result Value Ref Range Status   Specimen Description   Final    PERITONEAL Performed at [facility], [address] [place], [place] [address]    Special Requests   Final    Normal Performed at [facility], [address] [place], [place] [address]    Gram Stain NO WBC SEEN NO ORGANISMS SEEN   Final   Culture   Final    NO GROWTH 3 DAYS Performed at [facility], [address] [place], [place] [address]    Report Status 10/04/2020 FINAL  Final  Aerobic/Anaerobic Culture (surgical/deep wound)     Status: None   Collection Time: 09/30/20  4:56 PM   Specimen: Abscess  Result Value Ref Range Status   Specimen Description   Final    ABSCESS THIGH Performed at [facility], [address], [place], [place] [address]    Special Requests   Final    NONE Performed at [facility], [address], [place], [place] [address]    Gram Stain   Final    ABUNDANT WBC PRESENT,BOTH PMN AND MONONUCLEAR ABUNDANT GRAM POSITIVE COCCI IN PAIRS    Culture   Final    RARE STAPHYLOCOCCUS AUREUS NO ANAEROBES ISOLATED Performed at [facility], [address], [place], [place] [address]    Report Status 10/05/2020 FINAL  Final   Organism ID, Bacteria STAPHYLOCOCCUS AUREUS  Final      Susceptibility   Staphylococcus aureus - MIC*    CIPROFLOXACIN <=0.5 SENSITIVE Sensitive     ERYTHROMYCIN <=0.25 SENSITIVE Sensitive     GENTAMICIN <=0.5 SENSITIVE Sensitive     OXACILLIN  <=0.25 SENSITIVE Sensitive     TETRACYCLINE <=1 SENSITIVE Sensitive     VANCOMYCIN 1 SENSITIVE Sensitive     TRIMETH/SULFA <=10 SENSITIVE Sensitive     CLINDAMYCIN <=0.25 SENSITIVE Sensitive     RIFAMPIN >=32 RESISTANT Resistant  Inducible Clindamycin NEGATIVE Sensitive     * RARE STAPHYLOCOCCUS AUREUS  Culture, blood (routine x 2)     Status: None   Collection Time: 10/01/20  2:29 PM   Specimen: BLOOD RIGHT HAND  Result Value Ref Range Status   Specimen Description   Final    BLOOD RIGHT HAND Performed at [facility], [address] [place], [place] [address]    Special Requests   Final    BOTTLES DRAWN AEROBIC ONLY Blood Culture adequate volume Performed at [facility], [address] [place], [place] [address]    Culture   Final    NO GROWTH 5 DAYS Performed at [facility], [address], [place], [place] [address]    Report Status 10/06/2020 FINAL  Final  Culture, blood (routine x 2)     Status: None   Collection Time: 10/01/20  2:29 PM   Specimen: BLOOD RIGHT FOREARM  Result Value Ref Range Status   Specimen Description   Final    BLOOD RIGHT FOREARM Performed at [facility], [address], [place], [place] [address]    Special Requests   Final    BOTTLES DRAWN AEROBIC ONLY Blood Culture adequate volume Performed at [facility], [address], [place], [place] [address]    Culture   Final    NO GROWTH 5 DAYS Performed at [facility], [address], [place], [place] [address]    Report Status 10/06/2020 FINAL  Final     Time coordinating discharge: 35 minutes  SIGNED:   Jacquelin Hawking, MD [facility] 10/10/2020, 12:47 PM

## 2020-10-10 NOTE — Progress Notes (Signed)
There is a bed available in CIR for pt to admit today.  Dr. Caleb Popp is aware and in agreement. TOC, NSG, and pt/girlfriend made aware.   Wolfgang Phoenix, MS, CCC-SLP Admissions Coordinator [phone]

## 2020-10-10 NOTE — Progress Notes (Signed)
Inpatient Rehabilitation  Patient information reviewed and entered into eRehab system by Sherran Margolis M. Sandor Arboleda, M.A., CCC/SLP, PPS Coordinator.  Information including medical coding, functional ability and quality indicators will be reviewed and updated through discharge.    

## 2020-10-10 NOTE — TOC Transition Note (Signed)
Transition of Care [facility]) - CM/SW Discharge Note   Patient Details  Name: [name] MRN: [id number] Date of Birth: 01/13/1986  [facility] CM/SW Contact:  Amada Jupiter, LCSW Phone Number: 10/10/2020, 12:19 PM   Clinical Narrative:    Pt has been medically cleared, insurance authorized and ready for transfer to CIR today.  Pt aware and agreeable. [facility] to transport.  No further TOC needs.   Final next level of care: IP Rehab Facility Barriers to Discharge: Barriers Resolved   Patient Goals and CMS Choice Patient states their goals for this hospitalization and ongoing recovery are:: to go home if possible CMS Medicare.gov Compare Post Acute Care list provided to:: Patient    Discharge Placement                Patient to be transferred to facility by: to CIR via [facility] Name of family member notified: pt and girlfriend Patient and family notified of of transfer: 10/10/20  Discharge Plan and Services   Discharge Planning Services: CM Consult            DME Arranged: N/A DME Agency: NA                  Social Determinants of Health (SDOH) Interventions     Readmission Risk Interventions No flowsheet data found.

## 2020-10-11 DIAGNOSIS — A419 Sepsis, unspecified organism: Secondary | ICD-10-CM | POA: Diagnosis not present

## 2020-10-11 DIAGNOSIS — R5381 Other malaise: Secondary | ICD-10-CM

## 2020-10-11 DIAGNOSIS — R6521 Severe sepsis with septic shock: Secondary | ICD-10-CM | POA: Diagnosis not present

## 2020-10-11 LAB — COMPREHENSIVE METABOLIC PANEL
ALT: 7 U/L (ref 0–44)
AST: 28 U/L (ref 15–41)
Albumin: 1.6 g/dL — ABNORMAL LOW (ref 3.5–5.0)
Alkaline Phosphatase: 143 U/L — ABNORMAL HIGH (ref 38–126)
Anion gap: 7 (ref 5–15)
BUN: 7 mg/dL (ref 6–20)
CO2: 26 mmol/L (ref 22–32)
Calcium: 7.7 mg/dL — ABNORMAL LOW (ref 8.9–10.3)
Chloride: 95 mmol/L — ABNORMAL LOW (ref 98–111)
Creatinine, Ser: 0.36 mg/dL — ABNORMAL LOW (ref 0.61–1.24)
GFR, Estimated: >60 mL/min (ref >60)
Glucose, Bld: 91 mg/dL (ref 70–99)
Potassium: 3.6 mmol/L (ref 3.5–5.1)
Sodium: 128 mmol/L — ABNORMAL LOW (ref 135–145)
Total Bilirubin: 1.1 mg/dL (ref 0.3–1.2)
Total Protein: 5.5 g/dL — ABNORMAL LOW (ref 6.5–8.1)

## 2020-10-11 LAB — CBC WITH DIFFERENTIAL/PLATELET
Abs Immature Granulocytes: 0.22 10*3/uL — ABNORMAL HIGH (ref 0.00–0.07)
Basophils Absolute: 0 10*3/uL (ref 0.0–0.1)
Basophils Relative: 0 %
Eosinophils Absolute: 0.3 10*3/uL (ref 0.0–0.5)
Eosinophils Relative: 2 %
HCT: 26.8 % — ABNORMAL LOW (ref 39.0–52.0)
Hemoglobin: 9 g/dL — ABNORMAL LOW (ref 13.0–17.0)
Immature Granulocytes: 2 %
Lymphocytes Relative: 20 %
Lymphs Abs: 2.6 10*3/uL (ref 0.7–4.0)
MCH: 33.6 pg (ref 26.0–34.0)
MCHC: 33.6 g/dL (ref 30.0–36.0)
MCV: 100 fL (ref 80.0–100.0)
Monocytes Absolute: 1.1 10*3/uL — ABNORMAL HIGH (ref 0.1–1.0)
Monocytes Relative: 9 %
Neutro Abs: 8.4 10*3/uL — ABNORMAL HIGH (ref 1.7–7.7)
Neutrophils Relative %: 67 %
Platelets: 305 10*3/uL (ref 150–400)
RBC: 2.68 MIL/uL — ABNORMAL LOW (ref 4.22–5.81)
RDW: 17.5 % — ABNORMAL HIGH (ref 11.5–15.5)
WBC: 12.7 10*3/uL — ABNORMAL HIGH (ref 4.0–10.5)
nRBC: 0 % (ref 0.0–0.2)

## 2020-10-11 MED ORDER — CHLORHEXIDINE GLUCONATE CLOTH 2 % EX PADS
6.0000 | MEDICATED_PAD | Freq: Every day | CUTANEOUS | Status: DC
  Administered 2020-10-11 – 2020-10-15 (×5): 6 via TOPICAL

## 2020-10-11 NOTE — Evaluation (Signed)
Physical Therapy Assessment and Plan  Patient Details  Name: [name] MRN: [id number] Date of Birth: [birth year]  PT Diagnosis: Abnormality of gait, Difficulty walking, Edema, Impaired sensation, Muscle weakness and Pain in L hip/glute/groin Rehab Potential: Good ELOS: 2.5-3 weeks   Today's Date: 10/11/2020 PT Individual Time: 1000-1100 PT Individual Time Calculation (min): 60 min    Hospital Problem: Active Problems:   Septic shock ([place])   Debility   Past Medical History:  Past Medical History:  Diagnosis Date  . Alcohol abuse   . Asthma   . Depression   . Hypertension    Past Surgical History:  Past Surgical History:  Procedure Laterality Date  . BUBBLE STUDY  09/19/2020   Procedure: BUBBLE STUDY;  Surgeon: Fay Records, MD;  Location: [place];  Service: Cardiovascular;;  . FRACTURE SURGERY    . I & D EXTREMITY Left 09/20/2020   Procedure: IRRIGATION AND DEBRIDEMENT EXTREMITY LEFT LEG ABCESS;  Surgeon: Marchia Bond, MD;  Location: [facility];  Service: Orthopedics;  Laterality: Left;  . I & D EXTREMITY Left 09/27/2020   Procedure: IRRIGATION AND DEBRIDEMENT LEFT THIGH WITH WOUND  VAC APPLICATION;  Surgeon: Marchia Bond, MD;  Location: [facility];  Service: Orthopedics;  Laterality: Left;  . INCISION AND DRAINAGE OF WOUND Left 09/27/2020   Procedure: IRRIGATION AND DEBRIDEMENT LEFT GROIN;  Surgeon: Johnathan Hausen, MD;  Location: [facility];  Service: General;  Laterality: Left;  . IRRIGATION AND DEBRIDEMENT ABSCESS Left 09/22/2020   Procedure: IRRIGATION AND DEBRIDEMENT LEFT THIGH ABSCESS;  Surgeon: Erroll Luna, MD;  Location: [facility];  Service: General;  Laterality: Left;  DOW ROOM STARTING AT 09:00AM FOR 60 MIN  . TEE WITHOUT CARDIOVERSION N/A 09/19/2020   Procedure: TRANSESOPHAGEAL ECHOCARDIOGRAM (TEE);  Surgeon: Fay Records, MD;  Location: [facility];  Service: Cardiovascular;  Laterality: N/A;  . TOTAL HIP ARTHROPLASTY Left 09/15/2020   Procedure: TOTAL HIP  ARTHROPLASTY WITH CEMENT INTERPOSITION SPACING;  Surgeon: Marchia Bond, MD;  Location: [facility];  Service: Orthopedics;  Laterality: Left;    Assessment & Plan Clinical Impression: Patient is a 35 y.o. year old male with history significant of AVN of hip and had a scheduled plan for arthroplasty March 2022, chronic pain, hypertension, alcoholic hepatitis/cirrhosis with ascites with  as well as tobacco use and depression.  Recent admission for Covid, acute renal failure and alcoholic hepatitis 7/0/4888 08/29/2020.  Per chart review patient lives with spouse.  1 level home 2 steps to entry.  Independent with assistive device.  Presented 09/13/2020 with increased fatigue that progressed over 2-week.  As well as increasing left thigh and leg pain.  He did report he had a blister rupture on the inner thigh in the groin area.  Admission chemistry sodium 128, glucose 112, creatinine 0.59, lactic acid 3.3.  Hemoglobin 6.2 with latest hemoglobin 9.4 08/29/2020, blood culture Staph aureus.  FOBT negative.  CT imaging showed left iliopsoas and left gluteal abscesses with diffuse swelling and left hip effusion.  Patient started on broad-spectrum antibiotics.  Patient underwent left total hip arthroplasty with antibiotic spacer left hip excisional debridement of skin subcutaneous tissue muscle bone deep abscess 09/15/2020 per Dr. Mardelle Matte.  Infectious disease consulted as patient remained on antibiotic therapy TEE completed showing no PFO without masses or vegetation noted.  He later underwent left thigh excisional debridement of skin muscle fascia deep tissue abscess repositioning of percutaneous anterior pigtail catheter drain application of wound VAC 09/20/2020.  Hospital course complicated by left groin abscess  with I&D of left groin abscess and change of wound VAC to left thigh 09/22/2020 per general surgery Dr. Brantley Stage followed by repeat I&D of left groin abscess and left thigh abscess 09/27/2020.Marland Kitchen  Lower extremity Dopplers  completed 09/28/2020 showing no DVT.  Underwent ultrasound-guided diagnostic and therapeutic paracentesis 09/30/2020 as well as 10/05/2020 per interventional radiology yielding 3 L of clear light yellow fluid.  He did have some initial difficulties in voiding Foley catheter tube inserted ultrasound completed concerning for soft tissue infection urology follow-up Dr. Alinda Money evaluation appeared to be related to more regional edema from his leg and ankle infection.  There was no abscess noted on recent ultrasound.  Patient would remain on cefazolin, Flagyl and rifampin through 10/28/2020 per infectious disease.  Therapy evaluations completed due to patient decreased functional mobility was admitted for a comprehensive rehab program.  Patient currently requires max with mobility secondary to muscle weakness, decreased cardiorespiratoy endurance and decreased sitting balance, decreased standing balance, decreased postural control and difficulty maintaining precautions.  Prior to hospitalization, patient was min with mobility and lived with Family in a House home.  Home access is 2Stairs to enter.  Patient will benefit from skilled PT intervention to maximize safe functional mobility, minimize fall risk and decrease caregiver burden for planned discharge home with 24 hour assist.  Anticipate patient will benefit from follow up [facility] at discharge.  PT - End of Session Activity Tolerance: Tolerates 10 - 20 min activity with multiple rests Endurance Deficit: Yes Endurance Deficit Description: Significant rest breaks within BADL tasks PT Assessment Rehab Potential (ACUTE/IP ONLY): Good PT Barriers to Discharge: Decreased caregiver support;Wound Care;Lack of/limited family support;Weight bearing restrictions PT Patient demonstrates impairments in the following area(s): Balance;Edema;Endurance;Motor;Nutrition;Pain;Sensory;Skin Integrity;Safety PT Transfers Functional Problem(s): Bed Mobility;Bed to Chair;Car;Furniture PT  Locomotion Functional Problem(s): Ambulation;Wheelchair Mobility;Stairs PT Plan PT Intensity: Minimum of 1-2 x/day ,45 to 90 minutes PT Frequency: 5 out of 7 days PT Duration Estimated Length of Stay: 2.5-3 weeks PT Treatment/Interventions: Ambulation/gait training;Cognitive remediation/compensation;Discharge planning;DME/adaptive equipment instruction;Functional mobility training;Pain management;Psychosocial support;Splinting/orthotics;Therapeutic Activities;UE/LE Strength taining/ROM;Visual/perceptual remediation/compensation;Balance/vestibular training;Disease management/prevention;Community reintegration;Functional electrical stimulation;Neuromuscular re-education;Patient/family education;Skin care/wound management;Stair training;Therapeutic Exercise;UE/LE Coordination activities;Wheelchair propulsion/positioning PT Transfers Anticipated Outcome(s): CGA PT Locomotion Anticipated Outcome(s): CGA PT Recommendation Recommendations for Other Services: Neuropsych consult;Therapeutic Recreation consult Therapeutic Recreation Interventions: Stress management Follow Up Recommendations: Home health PT;24 hour supervision/assistance Patient destination: Home Equipment Recommended: To be determined   PT Evaluation Precautions/Restrictions Precautions Precautions: Fall;Posterior Hip;Other (comment) Precaution Comments: foley, wound vac L thigh, L JP drains L thigh Restrictions Weight Bearing Restrictions: Yes LLE Weight Bearing: Weight bearing as tolerated Other Position/Activity Restrictions: 3 JP drains in Lt anterior thigh and wound vac Home Living/Prior Functioning Home Living Living Arrangements: Spouse/significant other Available Help at Discharge: Family;Available PRN/intermittently Type of Home: House Home Access: Stairs to enter [employer] of Steps: 2 Entrance Stairs-Rails: Right;Left (can reach 1 side) Home Layout: One level Bathroom Shower/Tub: Clinical cytogeneticist: Handicapped height Bathroom Accessibility: Yes Additional Comments: Seat and grab bars, has 3-in-1 BSC  Lives With: Family Prior Function Level of Independence: Needs assistance with homemaking;Needs assistance with ADLs;Requires assistive device for independence;Needs assistance with gait;Needs assistance with tranfers Driving: No Vocation: Full time employment Comments: Pt reports he drove occasionally, needed some assist for LB ADLs, using a cane and crutches 2/2 hip pain Vision/Perception  Perception Perception: Within Functional Limits Praxis Praxis: Intact  Cognition Overall Cognitive Status: Within Functional Limits for tasks assessed Arousal/Alertness: Awake/alert Orientation Level: Oriented X4 Memory: Appears intact Immediate Memory  Recall: Sock;Blue;Bed Memory Recall Sock: Without Cue Memory Recall Blue: Without Cue Memory Recall Bed: Without Cue Awareness: Appears intact Problem Solving: Appears intact Safety/Judgment: Appears intact Sensation Sensation Light Touch: Impaired by gross assessment Peripheral sensation comments: slight L LE numbness in peri-surgical area Light Touch Impaired Details: Impaired LLE Hot/Cold: Not tested Proprioception: Appears Intact Stereognosis: Appears Intact Coordination Gross Motor Movements are Fluid and Coordinated: No Fine Motor Movements are Fluid and Coordinated: Yes Heel Shin Test: very limited AROM B due to weakness Motor  Motor Motor: Other (comment) Motor - Skilled Clinical Observations: Generalized weakness, gaurding of L hip   Trunk/Postural Assessment  Cervical Assessment Cervical Assessment: Within Functional Limits Thoracic Assessment Thoracic Assessment: Within Functional Limits Lumbar Assessment Lumbar Assessment: Exceptions to [name] (posterior pelvic tilt) Postural Control Postural Control: Deficits on evaluation Righting Reactions: delayed and inadequate Protective Responses:  delayed and inadequate  Balance Balance Balance Assessed: Yes Static Sitting Balance Static Sitting - Balance Support: Feet supported;Bilateral upper extremity supported Static Sitting - Level of Assistance: 5: Stand by assistance Dynamic Sitting Balance Dynamic Sitting - Balance Support: Feet supported Dynamic Sitting - Level of Assistance: 4: Min assist Sitting balance - Comments: Using arms to position him self more posteriorly however able to lift arms off of bed and maintain sitting balance Static Standing Balance Static Standing - Balance Support: Bilateral upper extremity supported Static Standing - Level of Assistance: 2: Max assist Dynamic Standing Balance Dynamic Standing - Level of Assistance: Not tested (comment) (unable to perform on eval) Dynamic Standing - Comments: unable to stand without Stedy Extremity Assessment  RUE Assessment RUE Assessment: Exceptions to [facility] General Strength Comments: generalized weakness grossly 4/5 LUE Assessment LUE Assessment: Exceptions to [facility] General Strength Comments: generalized weakness grossly 4/5 RLE Assessment RLE Assessment: Exceptions to [facility] RLE Strength Right Hip Flexion: 3+/5 Right Hip ABduction: 3+/5 Right Hip ADduction: 3+/5 Right Knee Flexion: 3/5 Right Knee Extension: 3+/5 Right Ankle Dorsiflexion: 3+/5 Right Ankle Plantar Flexion: 3+/5 LLE Assessment LLE Assessment: Exceptions to [facility] Active Range of Motion (AROM) Comments: limited ROM due to pain and surgical sites LLE Strength LLE Overall Strength Comments: resistance deferred due to pain Left Hip Flexion: 2-/5 Left Hip ABduction: 2-/5 Left Hip ADduction: 2-/5 Left Knee Flexion: 2-/5 Left Knee Extension: 2-/5 Left Ankle Dorsiflexion: 3/5 Left Ankle Plantar Flexion: 3/5  Care Tool Care Tool Bed Mobility Roll left and right activity   Roll left and right assist level: Moderate Assistance - Patient 50 - 74%    Sit to lying activity   Sit to lying assist  level: Maximal Assistance - Patient 25 - 49%    Lying to sitting edge of bed activity   Lying to sitting edge of bed assist level: Moderate Assistance - Patient 50 - 74%     Care Tool Transfers Sit to stand transfer   Sit to stand assist level: Total Assistance - Patient < 25%    Chair/bed transfer   Chair/bed transfer assist level: Dependent - Patient 0% Charlaine Dalton)     Toilet transfer   Assist Level: Dependent - Patient 0%    Scientist, product/process development transfer activity did not occur: Safety/medical concerns        Care Tool Locomotion Ambulation   Assist level: 2 helpers Assistive device: Parallel bars Max distance: 4  Walk 10 feet activity Walk 10 feet activity did not occur: Safety/medical concerns       Walk 50 feet with 2 turns activity Walk 50 feet with 2 turns  activity did not occur: Safety/medical concerns      Walk 150 feet activity Walk 150 feet activity did not occur: Safety/medical concerns      Walk 10 feet on uneven surfaces activity Walk 10 feet on uneven surfaces activity did not occur: Safety/medical concerns      Stairs Stair activity did not occur: Safety/medical concerns        Walk up/down 1 step activity Walk up/down 1 step or curb (drop down) activity did not occur: Safety/medical concerns     Walk up/down 4 steps activity did not occuR: Safety/medical concerns  Walk up/down 4 steps activity      Walk up/down 12 steps activity Walk up/down 12 steps activity did not occur: Safety/medical concerns      Pick up small objects from floor Pick up small object from the floor (from standing position) activity did not occur: Safety/medical concerns      Wheelchair Will patient use wheelchair at discharge?: Yes Type of Wheelchair: Manual   Wheelchair assist level: Supervision/Verbal cueing Max wheelchair distance: 150  Wheel 50 feet with 2 turns activity   Assist Level: Supervision/Verbal cueing  Wheel 150 feet activity   Assist Level: Supervision/Verbal  cueing    Refer to Care Plan for Long Term Goals  SHORT TERM GOAL WEEK 1 PT Short Term Goal 1 (Week 1): Patient will complete bed mobility with ModA PT Short Term Goal 2 (Week 1): Patient will ambulate at least 47f with LRAD and +2 assist as needed, MaxA PT Short Term Goal 3 (Week 1): Patient will transfer bed <> wc with LRAD and MaxA  Recommendations for other services: Neuropsych and Therapeutic Recreation  Stress management  Skilled Therapeutic Intervention Mobility Bed Mobility Bed Mobility: Supine to Sit;Sit to Supine Supine to Sit: Moderate Assistance - Patient 50-74% Sit to Supine: Maximal Assistance - Patient 25-49% Transfers Transfers: Transfer via LGeophysicist/field seismologistSit to Stand;Stand to Sit Sit to Stand: Maximal Assistance - Patient 25-49% Stand to Sit: Maximal Assistance - Patient 25-49% Transfer via Lift Equipment: SAnimal nutritionist Yes Gait Assistance: 2 HHoliday representative(Feet): 4 Feet Assistive device: Parallel bars Gait Assistance Details: Verbal cues for sequencing;Verbal cues for technique;Verbal cues for precautions/safety;Verbal cues for gait pattern Gait Gait: Yes Gait Pattern: Impaired Gait Pattern: Step-to pattern;Decreased stance time - left;Decreased hip/knee flexion - left;Decreased step length - right;Decreased weight shift to left;Antalgic;Lateral trunk lean to right Gait velocity: decreased Stairs / Additional Locomotion Stairs: No Wheelchair Mobility Wheelchair Mobility: Yes Wheelchair Assistance: SChartered loss adjuster Both upper extremities Wheelchair Parts Management: Supervision/cueing Distance: 150  Patient received reclined in bed, agreeable to PT eval. He reports 5/10 pain in L hip/groin/glute, premedicated. PT providing rest breaks, distractions and repositioning to assist with pain management. Patient is generally weak due to prolonged hospitalization and multiple surgeries, but  demonstrates significant weakness on L LE and decreased ROM due to wound vac and multiple surgical sites. He requires grossly MaxA/TotalA with greatest deficit noted for sit> stand transition. At end of eval, patient agreeable to remain up in wc, call light within reach.   Discharge Criteria: Patient will be discharged from PT if patient refuses treatment 3 consecutive times without medical reason, if treatment goals not met, if there is a change in medical status, if patient makes no progress towards goals or if patient is discharged from hospital.  The above assessment, treatment plan, treatment alternatives and goals were discussed and mutually agreed upon: by patient  JDebbora Dus  10/11/2020, 12:25 PM

## 2020-10-11 NOTE — Progress Notes (Signed)
PROGRESS NOTE   Subjective/Complaints:  No issues overnite, has good appetite this am , no other c/os this am   ROS- neg CP, SOB, N/V/D   Objective:   [place] ASCITES (ABDOMEN LIMITED)  Result Date: 10/09/2020 CLINICAL DATA:  History of alcoholic cirrhosis, recurrent symptomatic abdominal ascites, post paracentesis most recently 10/05/2020. EXAM: LIMITED ABDOMEN ULTRASOUND FOR ASCITES TECHNIQUE: Limited ultrasound survey for ascites was performed in all four abdominal quadrants. COMPARISON:  10/05/2020 FINDINGS: Small volume perihepatic and scattered abdominal ascites. No dominant pocket for safe therapeutic paracentesis. IMPRESSION: Small volume ascites.  Paracentesis deferred. Electronically Signed   By: Corlis Leak M.D.   On: 10/09/2020 14:36   Recent Labs    10/10/20 0920 10/11/20 0509  WBC 12.4* 12.7*  HGB 8.6* 9.0*  HCT 26.9* 26.8*  PLT 277 305   Recent Labs    10/11/20 0509  NA 128*  K 3.6  CL 95*  CO2 26  GLUCOSE 91  BUN 7  CREATININE 0.36*  CALCIUM 7.7*    Intake/Output Summary (Last 24 hours) at 10/11/2020 0827 Last data filed at 10/11/2020 0805 Gross per 24 hour  Intake 585 ml  Output 1196 ml  Net -611 ml        Physical Exam: Vital Signs Blood pressure 108/69, pulse 96, temperature 97.9 F (36.6 C), temperature source Oral, resp. rate 17, height 5\' 9"  (1.753 m), weight 89.2 kg, SpO2 95 %.  General: No acute distress Mood and affect are appropriate Heart: Regular rate and rhythm no rubs murmurs or extra sounds Lungs: Clear to auscultation, breathing unlabored, no rales or wheezes Abdomen: Positive bowel sounds, soft nontender to palpation, distended with ascites Extremities: No clubbing, cyanosis, 1+ LLE edema Skin: No evidence of breakdown, no evidence of rash Neurologic: Cranial nerves II through XII intact, motor strength is 5/5 in bilateral deltoid, bicep, tricep, grip,4/5 RIght and 3- Left hip  flexor, knee extensors, ankle dorsiflexor and plantar flexor Sensory exam normal sensation to light touch  in bilateral upper and lower extremities except small patch umbness in suprapatellar area  Musculoskeletal: Full range of motion in all 4 extremities. No joint swelling    Assessment/Plan: 1. Functional deficits which require 3+ hours per day of interdisciplinary therapy in a comprehensive inpatient rehab setting.  Physiatrist is providing close team supervision and 24 hour management of active medical problems listed below.  Physiatrist and rehab team continue to assess barriers to discharge/monitor patient progress toward functional and medical goals  Care Tool:  Bathing              Bathing assist       Upper Body Dressing/Undressing Upper body dressing        Upper body assist      Lower Body Dressing/Undressing Lower body dressing            Lower body assist       Toileting Toileting    Toileting assist Assist for toileting: Dependent - Patient 0%     Transfers Chair/bed transfer  Transfers assist           Locomotion Ambulation   Ambulation assist  Walk 10 feet activity   Assist           Walk 50 feet activity   Assist           Walk 150 feet activity   Assist           Walk 10 feet on uneven surface  activity   Assist           Wheelchair     Assist               Wheelchair 50 feet with 2 turns activity    Assist            Wheelchair 150 feet activity     Assist          Blood pressure 108/69, pulse 96, temperature 97.9 F (36.6 C), temperature source Oral, resp. rate 17, height 5\' 9"  (1.753 m), weight 89.2 kg, SpO2 95 %.     Medical Problem List and Plan: 1.  Debility secondary to septic shock/left gluteal/groin abscesses/septic arthritis with history of avascular necrosis left hip             -patient may not shower             -ELOS/Goals:  modI 10-14 days 2.  Impaired mobility: -DVT/anticoagulation: Continue SCDs.  Venous Doppler studies negative             -antiplatelet therapy: N/A 3. Pain Management: Continue MS Contin 15 mg every 12 hours, MSIR 15 mg every 6 hours as needed (only took twice since admit) 4. Depression:Continue Zoloft 50 mg daily.  Provide emotional support             -antipsychotic agents: N/A 5. Neuropsych: This patient is capable of making decisions on his own behalf. 6. Skin/Wound Care: Routine skin checks 7. Fluids/Electrolytes/Nutrition: Routine in and outs with follow-up chemistries 8.  AVN left hip.  Status post left total hip arthroplasty with antibiotic spacer left hip excisional debridement of skin subcutaneous tissue muscle bone deep abscess 09/15/2020 per Dr. 09/17/2020.  Weightbearing as tolerated with posterior hip precautions 9.  Left groin and left hip abscess.  Status post multiple I&D's.  Wound VAC as directed.- Gen Surg-Dr Cornett 10.  ID.  Continue ceftezole 2 g every 8 hours and Flagyl 500 mg every 8 hours and rifampin 300 mg every 12 hours through 10/28/2020 and stop.  Follow-up infectious disease 11.  Hepatic cirrhosis with ascites.  Status post multiple paracentesis.  GI recommends outpatient EGD.  Latest paracentesis 10/05/2020.  Continue Chronulac 10 mg twice daily 12.  Scrotal edema.  Follow-up urology services Dr. 10/07/2020.  No concern for infection.  Continue Foley tube until edema improved and then plan voiding trial 13.  Anemia/thrombocytopenia.  Follow-up CBC 14.  Hypertension.  Lopressor 50 mg twice daily, Aldactone 100 mg daily, Lasix 40 mg daily.BMET ok, Na+ low but stable will monitor   Monitor with increased mobility 15.  Tobacco abuse.  Counseling   LOS: 1 days A FACE TO FACE EVALUATION WAS PERFORMED  Laverle Patter 10/11/2020, 8:27 AM

## 2020-10-11 NOTE — Plan of Care (Signed)
  Problem: Consults Goal: RH GENERAL PATIENT EDUCATION Description: See Patient Education module for education specifics. Outcome: Progressing Goal: Skin Care Protocol Initiated - if Braden Score 18 or less Description: If consults are not indicated, leave blank or document N/A Outcome: Progressing Goal: Nutrition Consult-if indicated Outcome: Progressing   Problem: RH BOWEL ELIMINATION Goal: RH STG MANAGE BOWEL WITH ASSISTANCE Description: STG Manage Bowel with mod I Assistance. Outcome: Progressing   Problem: RH BLADDER ELIMINATION Goal: RH STG MANAGE BLADDER WITH ASSISTANCE Description: STG Manage Bladder With mod I Assistance Outcome: Progressing Goal: RH STG MANAGE BLADDER WITH EQUIPMENT WITH ASSISTANCE Description: STG Manage Bladder With Equipment With  min Assistance Outcome: Progressing   Problem: RH SKIN INTEGRITY Goal: RH STG SKIN FREE OF INFECTION/BREAKDOWN Description: Skin will be free of infection/breakdown with min assist Outcome: Progressing Goal: RH STG MAINTAIN SKIN INTEGRITY WITH ASSISTANCE Description: STG Maintain Skin Integrity With min Assistance. Outcome: Progressing Goal: RH STG ABLE TO PERFORM INCISION/WOUND CARE W/ASSISTANCE Description: STG Able To Perform Incision/Wound Care With min Assistance. Outcome: Progressing   Problem: RH SAFETY Goal: RH STG ADHERE TO SAFETY PRECAUTIONS W/ASSISTANCE/DEVICE Description: STG Adhere to Safety Precautions With cues/reminders Assistance/Device. Outcome: Progressing   Problem: RH PAIN MANAGEMENT Goal: RH STG PAIN MANAGED AT OR BELOW PT'S PAIN GOAL Description: Pain will be managed at of below 3 out of 10 on pain scale with min assist Outcome: Progressing   Problem: RH KNOWLEDGE DEFICIT GENERAL Goal: RH STG INCREASE KNOWLEDGE OF SELF CARE AFTER HOSPITALIZATION Description: Patient will be able to manage self care at discharge using handouts and educational materials with cues/reminders Outcome:  Progressing   

## 2020-10-11 NOTE — Progress Notes (Signed)
Physical Therapy Session Note  Patient Details  Name: [name] MRN: [id number] Date of Birth: 1986-03-17  Today's Date: 10/11/2020 PT Individual Time: 1400-1455 PT Individual Time Calculation (min): 55 min   Short Term Goals: Week 1:  PT Short Term Goal 1 (Week 1): Patient will complete bed mobility with ModA PT Short Term Goal 2 (Week 1): Patient will ambulate at least 54ft with LRAD and +2 assist as needed, MaxA PT Short Term Goal 3 (Week 1): Patient will transfer bed <> wc with LRAD and MaxA  Skilled Therapeutic Interventions/Progress Updates:  Patient received reclined in bed, agreeable to PT. He reports 5/10 pain in L thigh/hip, premedicated. PT providing rest breaks, distractions and repositioning to assist in pain management. He was able to come sit edge of bed with ModA and verbal cues with pursed lip breathing to further assist in pain management. MaxA to come to stand in [place] and transfer to wc via [place]. PT transporting patient in wc to therapy gym for time management and energy conservation. Patient completing sit <> stand from perched position in [place] x3 with MaxA. Emphasis on increased use of LE >UE. Patient with compensatory anterior lean and push-up like maneuver to assist in coming to full standing. Once standing, patient able to begin shifting weight R, L to regain comfort with weightbearing on L LE. Patient reports being able to tolerate ~25% weight bearing at this time due to pain. No hyperextension in stance noted. Patient requesting to return to bed due to pain. Stedy used to transfer with MaxA to come to standing and MaxA to return supine. Bed alarm on, call light within reach.    Therapy Documentation Precautions:  Precautions Precautions: Fall,Posterior Hip,Other (comment) Precaution Comments: foley, wound vac L thigh, L JP drains L thigh Restrictions Weight Bearing Restrictions: Yes LLE Weight Bearing: Weight bearing as tolerated Other Position/Activity  Restrictions: 3 JP drains in Lt anterior thigh and wound vac    Therapy/Group: Individual Therapy  Elizebeth Koller, PT, DPT, CBIS  10/11/2020, 2:59 PM

## 2020-10-11 NOTE — Plan of Care (Signed)
  Problem: Consults Goal: RH GENERAL PATIENT EDUCATION Description: See Patient Education module for education specifics. Outcome: Progressing Goal: Nutrition Consult-if indicated Outcome: Progressing   Problem: Consults Goal: Nutrition Consult-if indicated Outcome: Progressing

## 2020-10-11 NOTE — Plan of Care (Signed)
  Problem: RH Balance Goal: LTG: Patient will maintain dynamic sitting balance (OT) Description: LTG:  Patient will maintain dynamic sitting balance with assistance during activities of daily living (OT) Flowsheets (Taken 10/11/2020 1249) LTG: Pt will maintain dynamic sitting balance during ADLs with: Supervision/Verbal cueing Goal: LTG Patient will maintain dynamic standing with ADLs (OT) Description: LTG:  Patient will maintain dynamic standing balance with assist during activities of daily living (OT)  Flowsheets (Taken 10/11/2020 1249) LTG: Pt will maintain dynamic standing balance during ADLs with: Contact Guard/Touching assist   Problem: Sit to Stand Goal: LTG:  Patient will perform sit to stand in prep for activites of daily living with assistance level (OT) Description: LTG:  Patient will perform sit to stand in prep for activites of daily living with assistance level (OT) Flowsheets (Taken 10/11/2020 1249) LTG: PT will perform sit to stand in prep for activites of daily living with assistance level: Contact Guard/Touching assist   Problem: RH Grooming Goal: LTG Patient will perform grooming w/assist,cues/equip (OT) Description: LTG: Patient will perform grooming with assist, with/without cues using equipment (OT) Flowsheets (Taken 10/11/2020 1249) LTG: Pt will perform grooming with assistance level of: Set up assist    Problem: RH Bathing Goal: LTG Patient will bathe all body parts with assist levels (OT) Description: LTG: Patient will bathe all body parts with assist levels (OT) Flowsheets (Taken 10/11/2020 1249) LTG: Pt will perform bathing with assistance level/cueing: Contact Guard/Touching assist   Problem: RH Dressing Goal: LTG Patient will perform upper body dressing (OT) Description: LTG Patient will perform upper body dressing with assist, with/without cues (OT). Flowsheets (Taken 10/11/2020 1249) LTG: Pt will perform upper body dressing with assistance level of:  Supervision/Verbal cueing Goal: LTG Patient will perform lower body dressing w/assist (OT) Description: LTG: Patient will perform lower body dressing with assist, with/without cues in positioning using equipment (OT) Flowsheets (Taken 10/11/2020 1249) LTG: Pt will perform lower body dressing with assistance level of: Contact Guard/Touching assist   Problem: RH Toileting Goal: LTG Patient will perform toileting task (3/3 steps) with assistance level (OT) Description: LTG: Patient will perform toileting task (3/3 steps) with assistance level (OT)  Flowsheets (Taken 10/11/2020 1249) LTG: Pt will perform toileting task (3/3 steps) with assistance level: Minimal Assistance - Patient > 75%   Problem: RH Toilet Transfers Goal: LTG Patient will perform toilet transfers w/assist (OT) Description: LTG: Patient will perform toilet transfers with assist, with/without cues using equipment (OT) Flowsheets (Taken 10/11/2020 1249) LTG: Pt will perform toilet transfers with assistance level of: Contact Guard/Touching assist

## 2020-10-11 NOTE — Progress Notes (Signed)
[facility] Individual Statement of Services  Patient Name:  [name]  Date:  10/11/2020  Welcome to the [facility]patient [facility].  Our goal is to provide you with an individualized program based on your diagnosis and situation, designed to meet your specific needs.  With this comprehensive rehabilitation program, you will be expected to participate in at least 3 hours of rehabilitation therapies Monday-Friday, with modified therapy programming on the weekends.  Your rehabilitation program will include the following services:  Physical Therapy (PT), Occupational Therapy (OT), Speech Therapy (ST), 24 hour per day rehabilitation nursing, Therapeutic Recreaction (TR), Neuropsychology, Care Coordinator, Rehabilitation Medicine, Nutrition Services, Pharmacy Services and Other  Weekly team conferences will be held on Wednesdays to discuss your progress.  Your Inpatient Rehabilitation Care Coordinator will talk with you frequently to get your input and to update you on team discussions.  Team conferences with you and your family in attendance may also be held.  Expected length of stay: 18-21 Days  Overall anticipated outcome: Min A  Depending on your progress and recovery, your program may change. Your Inpatient Rehabilitation Care Coordinator will coordinate services and will keep you informed of any changes. Your Inpatient Rehabilitation Care Coordinator's name and contact numbers are listed  below.  The following services may also be recommended but are not provided by the Inpatient Rehabilitation Center:    Home Health Rehabiltiation Services  Outpatient Rehabilitation Services    Arrangements will be made to provide these services after discharge if needed.  Arrangements include referral to agencies that provide these services.  Your insurance has been verified to be:  [employer] Your primary doctor is:  [name], [name]  Pertinent information will  be shared with your doctor and your insurance company.  Inpatient Rehabilitation Care Coordinator:  Lavera Guise, [place] [phone] or [phone]  Information discussed with and copy given to patient by: Andria Rhein, 10/11/2020, 11:16 AM

## 2020-10-11 NOTE — Progress Notes (Signed)
Inpatient Rehabilitation Care Coordinator Assessment and Plan Patient Details  Name: [name] MRN: [id number] Date of Birth: 12/16/1985  Today's Date: 10/11/2020  Hospital Problems: Active Problems:   Septic shock ([place])   Debility  Past Medical History:  Past Medical History:  Diagnosis Date  . Alcohol abuse   . Asthma   . Depression   . Hypertension    Past Surgical History:  Past Surgical History:  Procedure Laterality Date  . BUBBLE STUDY  09/19/2020   Procedure: BUBBLE STUDY;  Surgeon: Fay Records, MD;  Location: [place];  Service: Cardiovascular;;  . FRACTURE SURGERY    . I & D EXTREMITY Left 09/20/2020   Procedure: IRRIGATION AND DEBRIDEMENT EXTREMITY LEFT LEG ABCESS;  Surgeon: Marchia Bond, MD;  Location: [facility];  Service: Orthopedics;  Laterality: Left;  . I & D EXTREMITY Left 09/27/2020   Procedure: IRRIGATION AND DEBRIDEMENT LEFT THIGH WITH WOUND  VAC APPLICATION;  Surgeon: Marchia Bond, MD;  Location: [facility];  Service: Orthopedics;  Laterality: Left;  . INCISION AND DRAINAGE OF WOUND Left 09/27/2020   Procedure: IRRIGATION AND DEBRIDEMENT LEFT GROIN;  Surgeon: Johnathan Hausen, MD;  Location: [facility];  Service: General;  Laterality: Left;  . IRRIGATION AND DEBRIDEMENT ABSCESS Left 09/22/2020   Procedure: IRRIGATION AND DEBRIDEMENT LEFT THIGH ABSCESS;  Surgeon: Erroll Luna, MD;  Location: [facility];  Service: General;  Laterality: Left;  DOW ROOM STARTING AT 09:00AM FOR 60 MIN  . TEE WITHOUT CARDIOVERSION N/A 09/19/2020   Procedure: TRANSESOPHAGEAL ECHOCARDIOGRAM (TEE);  Surgeon: Fay Records, MD;  [facility];  Service: Cardiovascular;  Laterality: N/A;  . TOTAL HIP ARTHROPLASTY Left 09/15/2020   Procedure: TOTAL HIP ARTHROPLASTY WITH CEMENT INTERPOSITION SPACING;  Surgeon: Marchia Bond, MD;  Location: [facility];  Service: Orthopedics;  Laterality: Left;   Social History:  reports that he has been smoking cigars. He has never used smokeless tobacco.  He reports current alcohol use of about 12.0 - 18.0 standard drinks of alcohol per week. He reports current drug use. Drug: Marijuana.  Family / Support Systems Marital Status: Single Patient Roles: Partner,Parent Spouse/Significant Other: [name] (S.O) Children: [name] Other Supports: [name] (Mother), [name] Anticipated Caregiver: [name] Ability/Limitations of Caregiver: Works 10AM-3PM (MIN/MOD A) Caregiver Availability: 24/7  Social History Preferred language: English Religion: Methodist Read: Yes Write: Yes Employment Status: Employed Name of Employer: Community education officer Issues: n/a Guardian/Conservator: n/a   Abuse/Neglect Abuse/Neglect Assessment Can Be Completed: Yes Physical Abuse: Denies Verbal Abuse: Denies Sexual Abuse: Denies Exploitation of patient/patient's resources: Denies Self-Neglect: Denies  Emotional Status Pt's affect, behavior and adjustment status: coping,depression Recent Psychosocial Issues: depression, coping Psychiatric History: hx of depression Substance Abuse History: alcohol  Patient / Family Perceptions, Expectations & Goals Pt/Family understanding of illness & functional limitations: yes Premorbid pt/family roles/activities: Active and working in community prior to Jan adm. Post ad require some assistance Anticipated changes in roles/activities/participation: Some assistance Pt/family expectations/goals: Min A  [employer]: None Premorbid Home Care/DME Agencies: Other (Comment) ([facility] [place], [facility], [facility], [facility]) Transportation available at discharge: family able to transport Resource referrals recommended: Neuropsychology (Coping/Substance)  Discharge Planning Living Arrangements: Spouse/significant other Support Systems: Spouse/significant other Type of Residence: Private residence (1 level, 2 steps to enter [employer]), 5 steps (back door), Ramp in  front) Administrator, sports: Multimedia programmer (specify) ([place]) Financial Resources: Employment Financial Screen Referred: No Living Expenses: Own Money Management: Patient Does the patient have any problems obtaining  your medications?: No Home Management: Independent Patient/Family Preliminary Plans: Some assistance Care Coordinator Barriers to Discharge: Lack of/limited family support Care Coordinator Barriers to Discharge Comments: Girlfriend following up with family to see if they can assist Care Coordinator Anticipated Follow Up Needs: HH/OP Expected length of stay: 18-21 Days  Clinical Impression SW met with patient introduced self [name] at bedside), explained role, addressed questions and concerns. [name] works during day off by 3, plan to meet in person on tomorrow for some follow up questions after conference. Sw will continue to follow up with question and concerns. Patient d/c home with [name] as primary care giver. 1 level home 2 steps to enter front door, 5 steps at back door.    [name] 10/11/2020, 12:38 PM

## 2020-10-11 NOTE — Evaluation (Signed)
Occupational Therapy Assessment and Plan  Patient Details  Name: [name] MRN: [id number] Date of Birth: 01/23/1986  OT Diagnosis: acute pain, muscle weakness (generalized) and swelling of limb Rehab Potential: Rehab Potential (ACUTE ONLY): Good ELOS: 3 weeks   Today's Date: 10/11/2020 OT Individual Time: 3419-6222 OT Individual Time Calculation (min): 60 min     Hospital Problem: Active Problems:   Septic shock ([place])   Debility  Past Medical History:  Past Medical History:  Diagnosis Date  . Alcohol abuse   . Asthma   . Depression   . Hypertension    Past Surgical History:  Past Surgical History:  Procedure Laterality Date  . BUBBLE STUDY  09/19/2020   Procedure: BUBBLE STUDY;  Surgeon: George Records, MD;  Location: [facility];  Service: Cardiovascular;;  . FRACTURE SURGERY    . I & D EXTREMITY Left 09/20/2020   Procedure: IRRIGATION AND DEBRIDEMENT EXTREMITY LEFT LEG ABCESS;  Surgeon: George Bond, MD;  Location: [facility];  Service: Orthopedics;  Laterality: Left;  . I & D EXTREMITY Left 09/27/2020   Procedure: IRRIGATION AND DEBRIDEMENT LEFT THIGH WITH WOUND  VAC APPLICATION;  Surgeon: George Bond, MD;  Location: [facility];  Service: Orthopedics;  Laterality: Left;  . INCISION AND DRAINAGE OF WOUND Left 09/27/2020   Procedure: IRRIGATION AND DEBRIDEMENT LEFT GROIN;  Surgeon: George Hausen, MD;  Location: [facility];  Service: General;  Laterality: Left;  . IRRIGATION AND DEBRIDEMENT ABSCESS Left 09/22/2020   Procedure: IRRIGATION AND DEBRIDEMENT LEFT THIGH ABSCESS;  Surgeon: George Luna, MD;  Location: [facility];  Service: General;  Laterality: Left;  DOW ROOM STARTING AT 09:00AM FOR 60 MIN  . TEE WITHOUT CARDIOVERSION N/A 09/19/2020   Procedure: TRANSESOPHAGEAL ECHOCARDIOGRAM (TEE);  Surgeon: George Records, MD;  Location: [facility];  Service: Cardiovascular;  Laterality: N/A;  . TOTAL HIP ARTHROPLASTY Left 09/15/2020   Procedure: TOTAL HIP ARTHROPLASTY WITH CEMENT  INTERPOSITION SPACING;  Surgeon: George Bond, MD;  Location: [facility];  Service: Orthopedics;  Laterality: Left;   Assessment & Plan Clinical Impression: Patient is a 35 y.o. year old male with recent admission to the hospital on with history significant of AVN of hip and had a scheduled plan for arthroplasty March 2022, chronic pain, hypertension, alcoholic hepatitis/cirrhosis with ascites with as well as tobacco use and depression. Recent admission for Covid, acute renal failure and alcoholic hepatitis 03/30/9891 08/29/2020. Per chart review patient lives with spouse. 1 level home 2 steps to entry. Independent with assistive device. Presented 09/13/2020 with increased fatigue that progressed over 2-week. As well as increasing left thigh and leg pain. He did report he had a blister rupture on the inner thigh in the groin area. Admission chemistry sodium 128, glucose 112, creatinine 0.59, lactic acid 3.3. Hemoglobin 6.2 with latest hemoglobin 9.4 08/29/2020, blood culture Staph aureus. FOBT negative. CT imaging showed left iliopsoas and left gluteal abscesses with diffuse swelling and left hip effusion. Patient started on broad-spectrum antibiotics. Patient underwent left total hip arthroplasty with antibiotic spacer left hip excisional debridement of skin subcutaneous tissue muscle bone deep abscess 09/15/2020 per [name]. Infectious disease consulted as patient remained on antibiotic therapy TEE completed showing no PFO without masses or vegetation noted. He later underwent left thigh excisional debridement of skin muscle fascia deep tissue abscess repositioning of percutaneous anterior pigtail catheter drain application of wound VAC 09/20/2020. Hospital course complicated by left groin abscess with I&D of left groin abscess and change of wound VAC to left  thigh 09/22/2020 per general surgery [name] followed by repeat I&D of left groin abscess and left thigh abscess 09/27/2020.Marland Kitchen Lower extremity Dopplers completed  09/28/2020 showing no DVT. Underwent ultrasound-guided diagnostic and therapeutic paracentesis 09/30/2020 as well as 10/05/2020 per interventional radiology yielding 3 L of clear light yellow fluid. He did have some initial difficulties in voiding Foley catheter tube inserted ultrasound completed concerning for soft tissue infection urology follow-up [name] evaluation appeared to be related to more regional edema from his leg and ankle infection. There was no abscess noted on recent ultrasound. Patient would remain on cefazolin, Flagyl and rifampin through 10/28/2020 per infectious disease.  Patient transferred to CIR on 10/10/2020 .    Patient currently requires max with basic self-care skills secondary to muscle weakness and muscle joint tightness, decreased cardiorespiratoy endurance and decreased sitting balance, decreased standing balance, decreased postural control and decreased balance strategies.  Prior to hospitalization, patient could complete BADL with min.  Patient will benefit from skilled intervention to increase independence with basic self-care skills prior to discharge home with care partner.  Anticipate patient will require 24 hour supervision and minimal physical assistance and follow up home health.  OT - End of Session Endurance Deficit: Yes Endurance Deficit Description: Significant rest breaks within BADL tasks OT Assessment Rehab Potential (ACUTE ONLY): Good OT Patient demonstrates impairments in the following area(s): Balance;Edema;Endurance;Motor;Nutrition;Pain;Skin Integrity OT Basic ADL's Functional Problem(s): Grooming;Bathing;Dressing;Toileting OT Transfers Functional Problem(s): Toilet;Tub/Shower OT Additional Impairment(s): None OT Plan OT Intensity: Minimum of 1-2 x/day, 45 to 90 minutes OT Frequency: 5 out of 7 days OT Duration/Estimated Length of Stay: 3 weeks OT Treatment/Interventions: Balance/vestibular training;Community reintegration;Discharge planning;Disease  mangement/prevention;DME/adaptive equipment instruction;Functional electrical stimulation;Functional mobility training;Neuromuscular re-education;Pain management;Self Care/advanced ADL retraining;Psychosocial support;Patient/family education;Skin care/wound managment;Therapeutic Activities;Splinting/orthotics;UE/LE Strength taining/ROM;Therapeutic Exercise;Wheelchair propulsion/positioning;Visual/perceptual remediation/compensation;UE/LE Coordination activities OT Basic Self-Care Anticipated Outcome(s): Supervision/min A OT Toileting Anticipated Outcome(s): Supervision OT Bathroom Transfers Anticipated Outcome(s): CGA OT Recommendation Recommendations for Other Services: Neuropsych consult Patient destination: Home Follow Up Recommendations: Home health OT Equipment Recommended: To be determined  OT Evaluation Precautions/Restrictions  Precautions Precautions: Fall;Posterior Hip Restrictions Weight Bearing Restrictions: Yes LLE Weight Bearing: Weight bearing as tolerated Other Position/Activity Restrictions: 3 JP drains in Lt anterior thigh and wound vac Pain  Pt reports 5/10 pain in L hip, rest and reposititioned for comfort Home Living/Prior Functioning Home Living Available Help at Discharge: Family (Girlfriend, and girlfriends daughter is a nures at [employer]) Type of Home: House Home Access: Stairs to enter [employer] of Steps: 2 in front, 5 in back, but has a ramp in place in front now Home Layout: One level Bathroom Shower/Tub: Multimedia programmer: Handicapped height Bathroom Accessibility: Yes Additional Comments: Seat and grab bars, has 3-in-1 BSC  Lives With: Family IADL History Type of Occupation: Dealer Leisure and Hobbies: Enjoys going to car shows Prior Function Level of Independence: Needs assistance with homemaking,Needs assistance with ADLs,Requires assistive device for independence Comments: Pt reports he drove occasionally, needed  some assist for LB ADLs, using a cane and crutches 2/2 hip pain Vision Patient Visual Report: No change from baseline Vision Assessment?: No apparent visual deficits Perception  Perception: Within Functional Limits Praxis Praxis: Intact Cognition Overall Cognitive Status: Within Functional Limits for tasks assessed Arousal/Alertness: Awake/alert Orientation Level: Person;Place;Situation Person: Oriented Place: Oriented Situation: Oriented Year: 2022 Month: March Day of Week: Correct Memory: Appears intact Immediate Memory Recall: Sock;Blue;Bed Memory Recall Sock: Without Cue Memory Recall Blue: Without Cue Memory Recall Bed: Without Cue Safety/Judgment: Appears  intact Sensation Coordination Gross Motor Movements are Fluid and Coordinated: No Fine Motor Movements are Fluid and Coordinated: Yes Motor  Motor Motor - Skilled Clinical Observations: Generalized weakness, gaurding of L hip  Balance Balance Balance Assessed: Yes Static Sitting Balance Static Sitting - Balance Support: Feet supported;Bilateral upper extremity supported Static Sitting - Level of Assistance: 5: Stand by assistance Dynamic Sitting Balance Dynamic Sitting - Balance Support: Feet supported Dynamic Sitting - Level of Assistance: 4: Min assist Dynamic Standing Balance Dynamic Standing - Comments: unable to stand without Stedy Extremity/Trunk Assessment RUE Assessment RUE Assessment: Exceptions to [facility] General Strength Comments: generalized weakness grossly 4/5 LUE Assessment LUE Assessment: Exceptions to [facility] General Strength Comments: generalized weakness grossly 4/5  Care Tool Care Tool Self Care Eating   Eating Assist Level: Set up assist    Oral Care    Oral Care Assist Level: Set up assist    Bathing   Body parts bathed by patient: Right arm;Left arm;Chest;Abdomen;Face Body parts bathed by helper: Front perineal area;Buttocks;Right upper leg;Left upper leg;Right lower leg;Left lower  leg   Assist Level: Maximal Assistance - Patient 24 - 49%    Upper Body Dressing(including orthotics)   What is the patient wearing?: Hospital gown only   Assist Level: Minimal Assistance - Patient > 75%    Lower Body Dressing (excluding footwear) Lower body dressing activity did not occur: Safety/medical concerns (Patient with multiple drains and wound vac)        Putting on/Taking off footwear   What is the patient wearing?: Non-skid slipper socks Assist for footwear: Dependent - Patient 0%       Care Tool Toileting Toileting activity   Assist for toileting: Total Assistance - Patient < 25%     Care Tool Bed Mobility Roll left and right activity        Sit to lying activity        Lying to sitting edge of bed activity         Care Tool Transfers Sit to stand transfer   Sit to stand assist level: Total Assistance - Patient < 25%    Chair/bed transfer   Chair/bed transfer assist level: Dependent - mechanical lift     Toilet transfer   Assist Level: Dependent - Patient 0%     Care Tool Cognition Expression of Ideas and Wants     Understanding Verbal and Non-Verbal Content     Memory/Recall Ability *first 3 days only      Refer to Care Plan for Long Term Goals  SHORT TERM GOAL WEEK 1 OT Short Term Goal 1 (Week 1): Patient will complete sit<>stand in Stedy with mod A OT Short Term Goal 2 (Week 1): Pt will tolerate sitting EOB for 10 minutes within BADL task OT Short Term Goal 3 (Week 1): Pt will use LH sponge to help was LEs  Recommendations for other services: Neuropsych   Skilled Therapeutic Intervention OT eval completed addressing rehab process, OT purpose, POC, ELOS, and goals. Pt completed bed mobility with max A to advance LLE and elevate trunk into sitting. Pt with difficulty sitting at EOB for extended time 2/2 discomfort in L hip as well as abdominal swelling from ascites. UB bathing completed at EOB with min A from OT. Pt donned clean gown 2/2 IV  running. Pt also with multiple drains in thigh and wound vac, so LB dressing deferred at this time. Pt reported need to go to the bathroom. Attempted sit<>stand from raised bed and  RW, but pt unable to power up. OT used Stedy with pt to stand with max A and bed raised. Pt transferred onto [facility] over toilet. Pt with successful BM, but needed total A for peri-care. Max A to stand from [facility] over toilet. Pt returned to bed at end of session and left semi-reclined in bed with needs met.   ADL ADL Where Assessed-Eating: Bed level Grooming: Setup;Minimal assistance Upper Body Bathing: Minimal assistance Lower Body Bathing: Dependent Upper Body Dressing: Minimal assistance Lower Body Dressing: Dependent Toileting: Dependent Toilet Transfer: Dependent (Stedy lift) Toilet Transfer Method: Other (comment) (Stedy) Mobility  Bed Mobility Bed Mobility: Supine to Sit;Sit to Supine Supine to Sit: Maximal Assistance - Patient - Patient 25-49% Sit to Supine: Maximal Assistance - Patient 25-49%   Discharge Criteria: Patient will be discharged from OT if patient refuses treatment 3 consecutive times without medical reason, if treatment goals not met, if there is a change in medical status, if patient makes no progress towards goals or if patient is discharged from hospital.  The above assessment, treatment plan, treatment alternatives and goals were discussed and mutually agreed upon: by patient  Valma Cava 10/11/2020, 10:03 AM

## 2020-10-12 ENCOUNTER — Inpatient Hospital Stay (HOSPITAL_COMMUNITY): Payer: 59

## 2020-10-12 HISTORY — PX: IR PARACENTESIS: IMG2679

## 2020-10-12 MED ORDER — LIDOCAINE HCL 1 % IJ SOLN
INTRAMUSCULAR | Status: AC
  Filled 2020-10-12: qty 20

## 2020-10-12 NOTE — Progress Notes (Signed)
Physical Therapy Session Note  Patient Details  Name: [name] MRN: [id number] Date of Birth: [birth year]  Today's Date: 10/12/2020 PT Individual Time:  - 0 min   Missed 60 minutes of therapy 2/2 medical testing.  Short Term Goals: Week 1:  PT Short Term Goal 1 (Week 1): Patient will complete bed mobility with ModA PT Short Term Goal 2 (Week 1): Patient will ambulate at least 75ft with LRAD and +2 assist as needed, MaxA PT Short Term Goal 3 (Week 1): Patient will transfer bed <> wc with LRAD and MaxA  Skilled Therapeutic Interventions/Progress Updates: Pt being wheeled from room for medical testing as PT approached.  Pt returned 86' later, but unable to participate w/ therapy 2/2 pain.     Therapy Documentation Precautions:  Precautions Precautions: Fall,Posterior Hip,Other (comment) Precaution Comments: foley, wound vac L thigh, L JP drains L thigh Restrictions Weight Bearing Restrictions: Yes LLE Weight Bearing: Weight bearing as tolerated Other Position/Activity Restrictions: 3 JP drains in Lt anterior thigh and wound vac General: PT Amount of Missed Time (min): 60 Minutes PT Missed Treatment Reason: Other (Comment) (abdominal US.) Vital Signs: Therapy Vitals Temp: 99 F (37.2 C) Pulse Rate: 94 Resp: 16 BP: 105/63 Patient Position (if appropriate): Lying Oxygen Therapy SpO2: 95 % O2 Device: Room Air Pain: pt states a lot of pain after testing.      Therapy/Group: Individual Therapy  Lucio Edward 10/12/2020, 2:13 PM

## 2020-10-12 NOTE — Progress Notes (Signed)
Patient staples removed from left hip. Area presents with  +3 pitting edema and serosanguinous drainage after removal. Area cleansed and ABD placed to area.  Cletis Media, LPN

## 2020-10-12 NOTE — Progress Notes (Deleted)
Physical Therapy Session Note  Patient Details  Name: [name] MRN: [id number] Date of Birth: 08-05-1985  Today's Date: 10/12/2020 PT Individual Time:  -    60 minutes missed, off floor for abdominal US Short Term Goals: Week 3:     Skilled Therapeutic Interventions/Progress Updates: Pt wheeled off floor for testing as PT approaches.  Pt returned 55 minutes later, but unable to participate w/ therapy 2/2 pain.     Therapy Documentation Precautions:  Precautions Precautions: Fall,Posterior Hip,Other (comment) Precaution Comments: foley, wound vac L thigh, L JP drains L thigh Restrictions Weight Bearing Restrictions: Yes LLE Weight Bearing: Weight bearing as tolerated Other Position/Activity Restrictions: 3 JP drains in Lt anterior thigh and wound vac General: PT Amount of Missed Time (min): 60 Minutes PT Missed Treatment Reason: Other (Comment) (abdominal US.) Vital Signs:   Pain: a lot of pain after test.      Therapy/Group: Individual Therapy  Lucio Edward 10/12/2020, 2:06 PM

## 2020-10-12 NOTE — Patient Care Conference (Signed)
Inpatient RehabilitationTeam Conference and Plan of Care Update Date: 10/12/2020   Time: 10:36 AM    Patient Name: [name]      Medical Record Number: [id number]  Date of Birth: 06/03/1986 Sex: Male         Room/Bed: 4W24C/4W24C-01 Payor Info: Payor: [employer]  / Plan: [employer] / Product Type: *No Product type* /    Admit Date/Time:  10/10/2020  2:34 PM  Primary Diagnosis:  <principal problem not specified>  Hospital Problems: Active Problems:   Septic shock [facility])   Debility    Expected Discharge Date: Expected Discharge Date: 11/01/20  Team Members Present: Physician leading conference: Dr. Sula Soda Care Coodinator Present: Chana Bode, RN, BSN, CRRN;Christina [place], BSW Nurse Present: Chana Bode, RN PT Present: Casimiro Needle, PT OT Present: Perrin Maltese, OT PPS Coordinator present : Edson Snowball, PT     Current Status/Progress Goal Weekly Team Focus  Bowel/Bladder   pt has indwelling urinary catheter pt is continent of bowel lbm 03/22  remain contient upon MD order remove foley and do trial voids  toilet prn   Swallow/Nutrition/ Hydration             ADL's   Max/total A LB ADLs, Stedy sit<.stand w/ max A  MinA/supervision  transfers, sit<>stand, general strengthening, activity tolerance, self-care retraining   Mobility   ModA/MaxA bed mobility, MaxA STS in [place], transfers with [place], supervision wc mobility x188ft, 4' gait in // bars with MaxA + wc follow, unable to assess stairs d/t pain/weakness  supervision- MinA  pain management, transfers, bed mobility, activity tolerance, wc mobility, gait progressions, stairs as able (will likely need 2.5-3 weeks here since gf is not available 24/7 to assist)   Communication             Safety/Cognition/ Behavioral Observations            Pain   hip pain medicated with scheduled analgesic and prn tylenol  <4/10  assess pain q shift   Skin   surgical incision hip abrasions on hands   ecchymosis of hands arms abdomen thigh  skin free of infection no breakdown  assess skin qshift and prn     Discharge Planning:  pt d/c home with girlfriend. Works during day 10-3?   Team Discussion: Rash on right calf; with questionable cause, MD reinforce need for use of compression stockings. ABD [place] to follow up on abd distention with pending paracentesis. Pain tolerable control. Multi wounds/incisions; sutures the leg to come out today. Continue with foley catheter and continent of bowel.   Patient on target to meet rehab goals: yes, currently supervision for upper body self care and max - total assist for lower body self care. Max assist for transfers and mod - max assist for PT. Able to ambulate 4' in parallel bars and maintain post hip precautions. Supervision - CGA goals set for discharge  *See Care Plan and progress notes for long and short-term goals.   Revisions to Treatment Plan:   Teaching Needs: Transfers, toileting, skin/wound care, medications, etc  Current Barriers to Discharge: Decreased caregiver support, Home enviroment access/layout and Wound care  Possible Resolutions to Barriers: Education for significant other Practice steps for entry to home     Medical Summary Current Status: Difficulty maintaining his hip precautions, pain reasonably controlled with medications, rash on his right calf  Barriers to Discharge: Medical stability  Barriers to Discharge Comments: Difficulty maintaining his hip precautions, pain reasonably controlled with medications,  rash on his right calf, abdominal distention Possible Resolutions to [employer]: continue current pain regimen, may remove removing sutures, abdominal ultrasound to see if paracentesis is warranted   Continued Need for Acute Rehabilitation Level of Care: The patient requires daily medical management by a physician with specialized training in physical medicine and rehabilitation for the following  reasons: Direction of a multidisciplinary physical rehabilitation program to maximize functional independence : Yes Medical management of patient stability for increased activity during participation in an intensive rehabilitation regime.: Yes Analysis of laboratory values and/or radiology reports with any subsequent need for medication adjustment and/or medical intervention. : Yes   I attest that I was present, lead the team conference, and concur with the assessment and plan of the team.   Pamelia Hoit 10/12/2020, 2:37 PM

## 2020-10-12 NOTE — Progress Notes (Signed)
Patient ID: [name], male   DOB: 1985/10/11, 35 y.o.   MRN: [id number] Team Conference Report to Patient/Family  Team Conference discussion was reviewed with the patient and caregiver, including goals, any changes in plan of care and target discharge date.  Patient and caregiver express understanding and are in agreement.  The patient has a target discharge date of 11/01/20.  SW met with patient. SW will meet with patient spouse to discuss LOC and recommendations and d/c. SW will continue to follow up. Dyanne Iha 10/12/2020, 1:39 PM

## 2020-10-12 NOTE — Consult Note (Signed)
WOC Nurse Consult Note: Surgical team following for assessment and plan of care for post-op wounds Vac dressing change to left thigh Wound type:Full thickness post-op wound; beefy red and moist, mod amt pink drainage in the cannister. 7X4X.5cm with undermining to .5cm on upper wound edges; wound has significantly decreased in depth since previous assessment.  Left thigh with 1 piece black foam applied after pt was medicated for pain. Tolerated with mod amt discomfort. Cont suction on at . Sutures intact to lower wound.  WOC team will plan dressing change Q M/W/F Cammie Mcgee MSN, RN, CWOCN, [place], CNS [phone]

## 2020-10-12 NOTE — Plan of Care (Signed)
  Problem: Consults Goal: RH GENERAL PATIENT EDUCATION Description: See Patient Education module for education specifics. Outcome: Progressing Goal: Skin Care Protocol Initiated - if Braden Score 18 or less Description: If consults are not indicated, leave blank or document N/A Outcome: Progressing Goal: Nutrition Consult-if indicated Outcome: Progressing   Problem: RH BOWEL ELIMINATION Goal: RH STG MANAGE BOWEL WITH ASSISTANCE Description: STG Manage Bowel with mod I Assistance. Outcome: Progressing   Problem: RH BLADDER ELIMINATION Goal: RH STG MANAGE BLADDER WITH ASSISTANCE Description: STG Manage Bladder With mod I Assistance Outcome: Progressing Goal: RH STG MANAGE BLADDER WITH EQUIPMENT WITH ASSISTANCE Description: STG Manage Bladder With Equipment With  min Assistance Outcome: Progressing   Problem: RH SKIN INTEGRITY Goal: RH STG SKIN FREE OF INFECTION/BREAKDOWN Description: Skin will be free of infection/breakdown with min assist Outcome: Progressing Goal: RH STG MAINTAIN SKIN INTEGRITY WITH ASSISTANCE Description: STG Maintain Skin Integrity With min Assistance. Outcome: Progressing Goal: RH STG ABLE TO PERFORM INCISION/WOUND CARE W/ASSISTANCE Description: STG Able To Perform Incision/Wound Care With min Assistance. Outcome: Progressing   Problem: RH SAFETY Goal: RH STG ADHERE TO SAFETY PRECAUTIONS W/ASSISTANCE/DEVICE Description: STG Adhere to Safety Precautions With cues/reminders Assistance/Device. Outcome: Progressing   Problem: RH PAIN MANAGEMENT Goal: RH STG PAIN MANAGED AT OR BELOW PT'S PAIN GOAL Description: Pain will be managed at of below 3 out of 10 on pain scale with min assist Outcome: Progressing   Problem: RH KNOWLEDGE DEFICIT GENERAL Goal: RH STG INCREASE KNOWLEDGE OF SELF CARE AFTER HOSPITALIZATION Description: Patient will be able to manage self care at discharge using handouts and educational materials with cues/reminders Outcome:  Progressing   

## 2020-10-12 NOTE — Procedures (Signed)
PROCEDURE SUMMARY:  Successful US guided paracentesis from right lateral abdomen.  Yielded 3.6 liters of pale, thin fluid.  No immediate complications.  Pt tolerated well.   Specimen was not sent for labs.  EBL < 45mL  Narayan Scull PA-C 10/12/2020 5:16 PM

## 2020-10-12 NOTE — Progress Notes (Signed)
Occupational Therapy Session Note  Patient Details  Name: [name] MRN: [id number] Date of Birth: 1986-06-01  Today's Date: 10/12/2020 OT Individual Time: 1425-1540 OT Individual Time Calculation (min): 75 min    Short Term Goals: Week 1:  OT Short Term Goal 1 (Week 1): Patient will complete sit<>stand in Stedy with mod A OT Short Term Goal 2 (Week 1): Pt will tolerate sitting EOB for 10 minutes within BADL task OT Short Term Goal 3 (Week 1): Pt will use LH sponge to help was LEs  Skilled Therapeutic Interventions/Progress Updates:   Pt completed supine to sit EOB with mod assist for moving the LLE over the edge of the bed adhering to his hip precautions.  He was then able to work on bathing tasks from the EOB.  He was able to complete UB bathing with supervision and was able to wash his lower legs as well with use of the LH sponge and supervision.  He did not attempt washing his front or back peri area this session.  Increased pain noted in the left hip and leg sitting EOB which increased as he sat.  Pain meds were given prior to transition to the EOB however.  He did also utilize the sockaide with min assist for donning his gripper socks.  Finished session with sit to stand using the RW for support from elevated bed with mod assist and taking 3-4 small steps up toward the top of the bed.  He transitioned back to supine with max assist and was positioned to comfort.  Pt's girlfriend also present during most of session and is very supportive.  Call button and phone in reach with safety alarm in place.     Therapy Documentation Precautions:  Precautions Precautions: Fall,Posterior Hip,Other (comment) Precaution Comments: foley, wound vac L thigh, L JP drains L thigh Restrictions Weight Bearing Restrictions: No LLE Weight Bearing: Weight bearing as tolerated Other Position/Activity Restrictions: 3 JP drains in Lt anterior thigh and wound vac  Pain: Pain Assessment Pain Scale:  0-10 Pain Score: 8  Faces Pain Scale: Hurts even more Pain Type: Acute pain;Surgical pain Pain Location: Hip Pain Orientation: Left Pain Descriptors / Indicators: Discomfort Pain Frequency: Occasional Pain Onset: On-going Patients Stated Pain Goal: 1 Pain Intervention(s): Medication (See eMAR) Multiple Pain Sites: No ADL: See Care Tool Section for some details of mobility and selfcare  Therapy/Group: Individual Therapy  Kierra Jezewski OTR/L 10/12/2020, 4:24 PM

## 2020-10-12 NOTE — Progress Notes (Signed)
PROGRESS NOTE   Subjective/Complaints: No complaints this morning initially When prompted, he mentions a rash on his right calf- not itching, he thinks it is from his compression garments. WBC trending upward slightly repeat tomorrow to trend.   ROS- denies CP, SOB, N/V/D   Objective:   No results found. Recent Labs    10/10/20 0920 10/11/20 0509  WBC 12.4* 12.7*  HGB 8.6* 9.0*  HCT 26.9* 26.8*  PLT 277 305   Recent Labs    10/11/20 0509  NA 128*  K 3.6  CL 95*  CO2 26  GLUCOSE 91  BUN 7  CREATININE 0.36*  CALCIUM 7.7*    Intake/Output Summary (Last 24 hours) at 10/12/2020 1124 Last data filed at 10/12/2020 0819 Gross per 24 hour  Intake 495 ml  Output 1575 ml  Net -1080 ml        Physical Exam: Vital Signs Blood pressure 107/71, pulse 96, temperature 97.7 F (36.5 C), resp. rate 18, height 5\' 9"  (1.753 m), weight 88.3 kg, SpO2 97 %.  Gen: no distress, normal appearing HEENT: oral mucosa pink and moist, NCAT Cardio: Reg rate Chest: normal effort, normal rate of breathing Abd: soft, distended with ascites Extremities: No clubbing, cyanosis, 1+ LLE edema Skin: No evidence of breakdown, no evidence of rash Neurologic: Cranial nerves II through XII intact, motor strength is 5/5 in bilateral deltoid, bicep, tricep, grip,4/5 RIght and 3- Left hip flexor, knee extensors, ankle dorsiflexor and plantar flexor Sensory exam normal sensation to light touch  in bilateral upper and lower extremities except small patch umbness in suprapatellar area  Musculoskeletal: Full range of motion in all 4 extremities. No joint swelling    Assessment/Plan: 1. Functional deficits which require 3+ hours per day of interdisciplinary therapy in a comprehensive inpatient rehab setting.  Physiatrist is providing close team supervision and 24 hour management of active medical problems listed below.  Physiatrist and rehab team  continue to assess barriers to discharge/monitor patient progress toward functional and medical goals  Care Tool:  Bathing    Body parts bathed by patient: Right arm,Left arm,Chest,Abdomen,Face   Body parts bathed by helper: Front perineal area,Buttocks,Right upper leg,Left upper leg,Right lower leg,Left lower leg     Bathing assist Assist Level: Maximal Assistance - Patient 24 - 49%     Upper Body Dressing/Undressing Upper body dressing   What is the patient wearing?: Hospital gown only    Upper body assist Assist Level: Minimal Assistance - Patient > 75%    Lower Body Dressing/Undressing Lower body dressing    Lower body dressing activity did not occur: Safety/medical concerns (Patient with multiple drains and wound vac)       Lower body assist       Toileting Toileting    Toileting assist Assist for toileting: Total Assistance - Patient < 25%     Transfers Chair/bed transfer  Transfers assist     Chair/bed transfer assist level: Dependent - Patient 0%     Locomotion Ambulation   Ambulation assist      Assist level: 2 helpers Assistive device: Parallel bars Max distance: 4   Walk 10 feet activity   Assist  Walk 10 feet activity did not occur: Safety/medical concerns        Walk 50 feet activity   Assist Walk 50 feet with 2 turns activity did not occur: Safety/medical concerns         Walk 150 feet activity   Assist Walk 150 feet activity did not occur: Safety/medical concerns         Walk 10 feet on uneven surface  activity   Assist Walk 10 feet on uneven surfaces activity did not occur: Safety/medical concerns         Wheelchair     Assist Will patient use wheelchair at discharge?: Yes Type of Wheelchair: Manual    Wheelchair assist level: Supervision/Verbal cueing Max wheelchair distance: 150    Wheelchair 50 feet with 2 turns activity    Assist        Assist Level: Supervision/Verbal cueing    Wheelchair 150 feet activity     Assist      Assist Level: Supervision/Verbal cueing   Blood pressure 107/71, pulse 96, temperature 97.7 F (36.5 C), resp. rate 18, height 5\' 9"  (1.753 m), weight 88.3 kg, SpO2 97 %.     Medical Problem List and Plan: 1.  Debility secondary to septic shock/left gluteal/groin abscesses/septic arthritis with history of avascular necrosis left hip             -patient may not shower             -ELOS/Goals: modI 10-14 days  -Continue CIR 2.  Impaired mobility: -DVT/anticoagulation: Continue SCDs.  Venous Doppler studies negative             -antiplatelet therapy: N/A 3. Pain Management: Continue MS Contin 15 mg every 12 hours, MSIR 15 mg every 6 hours as needed (only took twice since admit) 4. Depression:Continue Zoloft 50 mg daily.  Provide emotional support             -antipsychotic agents: N/A 5. Neuropsych: This patient is capable of making decisions on his own behalf. 6. Skin/Wound Care: Routine skin checks 7. Fluids/Electrolytes/Nutrition: Routine in and outs with follow-up chemistries 8.  AVN left hip.  Status post left total hip arthroplasty with antibiotic spacer left hip excisional debridement of skin subcutaneous tissue muscle bone deep abscess 09/15/2020 per Dr. 09/17/2020.  Weightbearing as tolerated with posterior hip precautions 9.  Left groin and left hip abscess.  Status post multiple I&D's.  Wound VAC as directed.- Gen Surg-Dr Cornett. May d/c sutures today 10.  ID.  Continue ceftezole 2 g every 8 hours and Flagyl 500 mg every 8 hours and rifampin 300 mg every 12 hours through 10/28/2020 and stop.  Follow-up infectious disease 11.  Hepatic cirrhosis with ascites.  Status post multiple paracentesis.  GI recommends outpatient EGD.  Latest paracentesis 10/05/2020.  Continue Chronulac 10 mg twice daily  3/23: repeat abdominal ultrasound today given increasing distension 12.  Scrotal edema.  Follow-up urology services Dr. 4/23.  No concern  for infection.  Continue Foley tube until edema improved and then plan voiding trial 13.  Anemia/thrombocytopenia.  Follow-up CBC 14.  Hypertension.  Lopressor 50 mg twice daily, Aldactone 100 mg daily, Lasix 40 mg daily.BMET ok, Na+ low but stable will monitor   Monitor with increased mobility 15.  Tobacco abuse.  Counseling 16. Hyponatremia: Na 128 on 3/23- repeat tomorrow to trend since downtrending   LOS: 2 days A FACE TO FACE EVALUATION WAS PERFORMED  4/23 P Raulkar 10/12/2020, 11:24 AM

## 2020-10-12 NOTE — Consult Note (Signed)
WOC Nurse Consult Note: Patient receiving care in [facility] [phone]. Reason for Consult: VAC dressing changes to thigh I spoke with primary RN, Mauro Kaufmann, and explained that a WOC nurse will be there at 0800 to change the thigh VAC dressing, and I understand the patient will need pre-medicating.  She verbalized her understanding and stated she would do it after she gets report. Helmut Muster, RN, MSN, CWOCN, CNS-BC, pager [phone]

## 2020-10-12 NOTE — Progress Notes (Signed)
Physical Therapy Session Note  Patient Details  Name: [name] MRN: [id number] Date of Birth: 11/15/1985  Today's Date: 10/12/2020 PT Individual Time: 1761-6073 PT Individual Time Calculation (min): 53 min   Short Term Goals: Week 1:  PT Short Term Goal 1 (Week 1): Patient will complete bed mobility with ModA PT Short Term Goal 2 (Week 1): Patient will ambulate at least 12ft with LRAD and +2 assist as needed, MaxA PT Short Term Goal 3 (Week 1): Patient will transfer bed <> wc with LRAD and MaxA  Skilled Therapeutic Interventions/Progress Updates:    Pt received supine in bed with MD exiting upon therapist arrival - pt agreeable to therapy session. MD requests therapist don TED hose to B LEs due to 4+ pitting edema noted on dorsum of bilateral feet. Educated pt on proper positioning support for LEs to provide elevation and floating of heels. Pt reports pain at 5/10 in L hip and groin that increases with mobility (unrated) but eases back down to a 5/10 once returned to resting in supine at end of session - rest breaks, repositioning, distraction, emotional support, and premedicated for pain management. Pt able to recall L LE posterior hip precautions with only question cuing and demonstrates reasonable compliance during functional mobility tasks with most difficulty not flexing trunk too far forward during sit<>stand transfers. Supine>sitting L EOB, HOB partially elevated and using bedrails as needed, with min assist primarily for L LE management to EOB and pt demonstrating good compliance with hip precautions. Sitting EOB performed L LE long arc quads through limited ~40degrees of ROM lacking terminal knee extension and flexion beyond 90 - 2x10reps with external targets to promote increased ROM. Educated pt on importance of performing ROM and strengthening of L LE to avoid decreased joint ROM with risk for contracture. Sitting EOB R LE long arc quads x20 reps with pt demoing full ROM and good  quad contraction. Sit>stand significantly elevated EOB>stedy with +2 mod assist for lifting and therapist blocking excessive trunk flexion (elevated EOB to increase pt independence and to decrease risk of breaking hip precautions). Repeated sit<>stands to/from stedy seat x5 reps with facilitation at L hip for extension and at trunk for extension - with increased reps pt demos improved L LE quad and glute activation to sustain knee extension and not compensate by flexing int stedy knee pad. Sit>supine min assist for L LE management onto bed and cuing for sequencing to maintain hip precautions. Pt left supine in bed with needs in reach, bed alarm on, and lines intact.    Therapy Documentation Precautions:  Precautions Precautions: Fall,Posterior Hip,Other (comment) Precaution Comments: foley, wound vac L thigh, L JP drains L thigh Restrictions Weight Bearing Restrictions: Yes LLE Weight Bearing: Weight bearing as tolerated Other Position/Activity Restrictions: 3 JP drains in Lt anterior thigh and wound vac  Pain: Details above.    Therapy/Group: Individual Therapy  Ginny Forth , PT, DPT, CSRS  10/12/2020, 7:49 AM

## 2020-10-13 LAB — CBC WITH DIFFERENTIAL/PLATELET
Abs Immature Granulocytes: 0.16 10*3/uL — ABNORMAL HIGH (ref 0.00–0.07)
Basophils Absolute: 0 10*3/uL (ref 0.0–0.1)
Basophils Relative: 0 %
Eosinophils Absolute: 0.4 10*3/uL (ref 0.0–0.5)
Eosinophils Relative: 3 %
HCT: 25.8 % — ABNORMAL LOW (ref 39.0–52.0)
Hemoglobin: 8.5 g/dL — ABNORMAL LOW (ref 13.0–17.0)
Immature Granulocytes: 1 %
Lymphocytes Relative: 21 %
Lymphs Abs: 2.7 10*3/uL (ref 0.7–4.0)
MCH: 33.1 pg (ref 26.0–34.0)
MCHC: 32.9 g/dL (ref 30.0–36.0)
MCV: 100.4 fL — ABNORMAL HIGH (ref 80.0–100.0)
Monocytes Absolute: 1.3 10*3/uL — ABNORMAL HIGH (ref 0.1–1.0)
Monocytes Relative: 10 %
Neutro Abs: 8.2 10*3/uL — ABNORMAL HIGH (ref 1.7–7.7)
Neutrophils Relative %: 65 %
Platelets: 284 10*3/uL (ref 150–400)
RBC: 2.57 MIL/uL — ABNORMAL LOW (ref 4.22–5.81)
RDW: 17 % — ABNORMAL HIGH (ref 11.5–15.5)
WBC: 12.8 10*3/uL — ABNORMAL HIGH (ref 4.0–10.5)
nRBC: 0 % (ref 0.0–0.2)

## 2020-10-13 LAB — BASIC METABOLIC PANEL
Anion gap: 6 (ref 5–15)
BUN: 5 mg/dL — ABNORMAL LOW (ref 6–20)
CO2: 27 mmol/L (ref 22–32)
Calcium: 7.7 mg/dL — ABNORMAL LOW (ref 8.9–10.3)
Chloride: 97 mmol/L — ABNORMAL LOW (ref 98–111)
Creatinine, Ser: <0.3 mg/dL — ABNORMAL LOW (ref 0.61–1.24)
Glucose, Bld: 87 mg/dL (ref 70–99)
Potassium: 3.2 mmol/L — ABNORMAL LOW (ref 3.5–5.1)
Sodium: 130 mmol/L — ABNORMAL LOW (ref 135–145)

## 2020-10-13 MED ORDER — POTASSIUM CHLORIDE CRYS ER 20 MEQ PO TBCR
40.0000 meq | EXTENDED_RELEASE_TABLET | Freq: Two times a day (BID) | ORAL | Status: AC
  Administered 2020-10-13 (×2): 40 meq via ORAL
  Filled 2020-10-13 (×2): qty 2

## 2020-10-13 NOTE — Progress Notes (Addendum)
Occupational Therapy Session Note  Patient Details  Name: [name] MRN: [id number] Date of Birth: 01/19/1986  Today's Date: 10/13/2020 OT Individual Time: 9242-6834 OT Individual Time Calculation (min): 76 min    Short Term Goals: Week 1:  OT Short Term Goal 1 (Week 1): Patient will complete sit<>stand in Stedy with mod A OT Short Term Goal 2 (Week 1): Pt will tolerate sitting EOB for 10 minutes within BADL task OT Short Term Goal 3 (Week 1): Pt will use LH sponge to help was LEs  Skilled Therapeutic Interventions/Progress Updates:    Session 1: ([id number]) Pt in bed to start session with his significant other and the MD in the room.  He was agreeable to participating in transfer to the EOB as well as to the wheelchair.  He was able to transfer to the EOB with mod assist to advance the LLE following his hip precautions.  He then sat EOB with close supervision and increased posterior weightshift initially.  Eventually, he was able to bring his trunk forward for better alignment and less need for UE support.  He completed stand pivot transfer to the wheelchair from the elevated bed and with the RW for support at mod assist.  Once in the wheelchair he was able to be positioned at the sink where he completed oral hygiene with setup assist.  Next, he completed wheelchair mobility down to the therapy gym with supervision and increased time.  Therapist assisted with transition down to the ortho gym where he completed two intervals of 5 mins each with use of the UE ergonometer.  Resistance was set on level 8 initially for the first set peddling forward with RMPs maintained at 25-28.  O2 sats 97% post set with HR at 105.  On the second set, he was able to peddle backwards with resistance reduced to level 5 and RPMs maintained at 25-30.  Finished session with return to the room with therapist assist.  He agreed to remain sitting up in the wheelchair with the call button and phone in reach.    Session  2: ([id number])  Pt in bed to start session, agreeable to transfer to the wheelchair and for session out of the room today.  Mod assist for moving the LLE over to the side of the bed.  He was able to manage his upper body and RLE without assistance and with HOB elevated at approximately 45 degrees.  He was then able to complete stand pivot transfer to the wheelchair with mod assist using the RW and elevated bed.  Took him down to the dayroom where session focused on sit to stand and standing balance while engaged in use of the RUE for tossing bean bags to [employer].  He needed max assist for sit to stand from the wheelchair with the LUE placed on the walker and the RUE on the arm rest.  He completed 2 intervals of standing for 7 mins and 5 mins while engaged in activity.  Min assist overall for balance with emphasis on allowing some weightbearing through the LLE in standing.  He exhibited increased weakness with UE strength, having difficulty tossing the lighter bean bags to the board which was placed approximately 12' away.  Increased pain was reported in the left hip but still tolerable.  HR increased up to the low 100s during task with O2 at 97% on room air.  Finished session with return to the room via wheelchair and transfer back to the bed with  max assist stand pivot.  Mod assist for transfer to supine with total assist +2 for scooting up to the [facility].  He was left with his call button and phone in reach and safety alarm in place.    Therapy Documentation Precautions:  Precautions Precautions: Fall,Posterior Hip,Other (comment) Precaution Comments: foley, wound vac L thigh, L JP drains L thigh Restrictions Weight Bearing Restrictions: No LLE Weight Bearing: Weight bearing as tolerated Other Position/Activity Restrictions: 3 JP drains in Lt anterior thigh and wound vac   Pain: Pain Assessment Pain Scale: Faces Pain Score: 3  Faces Pain Scale: Hurts a little bit Pain Type: Surgical pain Pain  Location: Hip Pain Orientation: Left Pain Descriptors / Indicators: Discomfort Pain Onset: With Activity Pain Intervention(s): Repositioned PAINAD (Pain Assessment in Advanced Dementia) Breathing: normal ADL: See Care Tool Section for some details of mobility and selfcare  Therapy/Group: Individual Therapy  Charistopher Rumble OTR/L 10/13/2020, 12:24 PM

## 2020-10-13 NOTE — Plan of Care (Signed)
  Problem: Consults Goal: RH GENERAL PATIENT EDUCATION Description: See Patient Education module for education specifics. Outcome: Progressing Goal: Skin Care Protocol Initiated - if Braden Score 18 or less Description: If consults are not indicated, leave blank or document N/A Outcome: Progressing Goal: Nutrition Consult-if indicated Outcome: Progressing   Problem: RH BOWEL ELIMINATION Goal: RH STG MANAGE BOWEL WITH ASSISTANCE Description: STG Manage Bowel with mod I Assistance. Outcome: Progressing   Problem: RH BLADDER ELIMINATION Goal: RH STG MANAGE BLADDER WITH ASSISTANCE Description: STG Manage Bladder With mod I Assistance Outcome: Progressing Goal: RH STG MANAGE BLADDER WITH EQUIPMENT WITH ASSISTANCE Description: STG Manage Bladder With Equipment With  min Assistance Outcome: Progressing   Problem: RH SKIN INTEGRITY Goal: RH STG SKIN FREE OF INFECTION/BREAKDOWN Description: Skin will be free of infection/breakdown with min assist Outcome: Progressing Goal: RH STG MAINTAIN SKIN INTEGRITY WITH ASSISTANCE Description: STG Maintain Skin Integrity With min Assistance. Outcome: Progressing Goal: RH STG ABLE TO PERFORM INCISION/WOUND CARE W/ASSISTANCE Description: STG Able To Perform Incision/Wound Care With min Assistance. Outcome: Progressing   Problem: RH SAFETY Goal: RH STG ADHERE TO SAFETY PRECAUTIONS W/ASSISTANCE/DEVICE Description: STG Adhere to Safety Precautions With cues/reminders Assistance/Device. Outcome: Progressing   Problem: RH PAIN MANAGEMENT Goal: RH STG PAIN MANAGED AT OR BELOW PT'S PAIN GOAL Description: Pain will be managed at of below 3 out of 10 on pain scale with min assist Outcome: Progressing   Problem: RH KNOWLEDGE DEFICIT GENERAL Goal: RH STG INCREASE KNOWLEDGE OF SELF CARE AFTER HOSPITALIZATION Description: Patient will be able to manage self care at discharge using handouts and educational materials with cues/reminders Outcome:  Progressing

## 2020-10-13 NOTE — Progress Notes (Signed)
Physical Therapy Session Note  Patient Details  Name: [name] MRN: [id number] Date of Birth: August 01, 1985  Today's Date: 10/13/2020 PT Individual Time: 7867-6720 PT Individual Time Calculation (min): 61 min   Short Term Goals: Week 1:  PT Short Term Goal 1 (Week 1): Patient will complete bed mobility with ModA PT Short Term Goal 2 (Week 1): Patient will ambulate at least 66ft with LRAD and +2 assist as needed, MaxA PT Short Term Goal 3 (Week 1): Patient will transfer bed <> wc with LRAD and MaxA  Skilled Therapeutic Interventions/Progress Updates:    Pt received supine in bed reporting he is in generalized pain/discomfort "all over" but predominantly in L LE from increased activity today during therapy. Despite this, pt eager and agreeable to therapy session. Pt continues to have 4+ pitting edema in B LEs especially on dorsum of foot - already wearing TED hose. Reinforced prior education regarding importance of performing ankle DF/PF at least 3x20 reps throughout each day. Performed the following supine exercises for B LE ROM and strengthening: - L LE AAROM heel slides into hip/knee flexion/extension x10reps, x5 reps due to increased pain on 2nd set- demonstrates significant hip and knee flexion ROM limitations that is compounded by increased edema and pain - R LE heel slides 2x15 reps with decreased strength noted - L LE ankle eversion 2x15 reps due to pt continuing to demonstrate excessive ankle inversion/supination while lying in supine  - L LE AAROM hip abduction/adduction through ~15-20degrees of ROM (able to move further into abduction and not quiet reaching midline in adduction with pt reporting increased pain moving into adduction compared to abduction)  - R LE hip abduction/adduction with cuing to sustain quad and ankle DF activation throughout with neutral hip rotation Provided pt with printed HEP below with education to perform exercises within ability as limited on L LE.    Supine>sitting L EOB, HOB partially elevated, via long sitting technique to maintain L posterior hip precautions with min assist for L LE management - relies heavily on B UEs to assist with trunk control. Sit>stand elevated EOB>RW mod assist of 1 for lifting and min assist for balance in standing. Standing B UE support on RW R/L lateral weight shifting for pain management and increased L LE WBing tolerance, cuing to relax shoulder positioning. Gait training ~80ft using RW with min assist of 1 and +2 for line management - demonstrates slow, guarded gait pattern with decreased B LE step lengths and decreased L LE WBing via UE compensation on RW. Standing>sitting RW>elevated EOB with mod assist for lowering - pt with very slow eccentric control for pain management. Sit>supine, HOB elevated and using bedrails, mod assist for B LE management onto bed. Pt left supine in bed with needs in reach, bed alarm on, and lines intact.   Access Code: [email or web address] URL: [email or web address] Date: 10/13/2020 Prepared by: Casimiro Needle  Exercises Supine Heel Slide - 1 x daily - 7 x weekly - 3 sets - 8 reps Supine Ankle Eversion AROM - 1 x daily - 7 x weekly - 3 sets - 10 reps Supine Hip Abduction - 1 x daily - 7 x weekly - 2 sets - 10 reps   Therapy Documentation Precautions:  Precautions Precautions: Fall,Posterior Hip,Other (comment) Precaution Comments: foley, wound vac L thigh, L JP drains L thigh Restrictions Weight Bearing Restrictions: No LLE Weight Bearing: Weight bearing as tolerated Other Position/Activity Restrictions: 3 JP drains in Lt anterior thigh and wound vac  Pain:  Reports L LE pain, unrated, provided rest breaks, repositioning, distraction, emotional support, and premedicated for pain management.   Therapy/Group: Individual Therapy  Ginny Forth , PT, DPT, CSRS  10/13/2020, 3:04 PM

## 2020-10-13 NOTE — Progress Notes (Signed)
PROGRESS NOTE   Subjective/Complaints: Pain is stable- medications help. Albumin 1.6- discussed with patient and girlfriend. Had IV albumin at [facility]. Has been trying to eat more protein. Given anasarca, will consult nephro regarding repeating IV albumin.   ROS- denies CP, SOB, N/V/D, +incisional pain  Objective:   US Abdomen Limited  Result Date: 10/12/2020 CLINICAL DATA:  Ascites. EXAM: LIMITED ABDOMEN ULTRASOUND FOR ASCITES TECHNIQUE: Limited ultrasound survey for ascites was performed in all four abdominal quadrants. COMPARISON:  October 09, 2020. FINDINGS: Minimal ascites is noted in the left upper quadrant. Mild ascites is noted around the liver in the right upper quadrant. Mild to moderate ascites is noted in both lower quadrants. IMPRESSION: Mild to moderate ascites is noted, with the largest amount seen in right pericolic gutter region. Electronically Signed   By: Lupita Raider M.D.   On: 10/12/2020 15:03   IR Paracentesis  Result Date: 10/12/2020 INDICATION: Patient with abdominal distension, recurrent ascites. Request is made for therapeutic paracentesis. EXAM: ULTRASOUND GUIDED THERAPEUTIC PARACENTESIS MEDICATIONS: 10 mL 1% lidocaine COMPLICATIONS: None immediate. PROCEDURE: Informed written consent was obtained from the patient after a discussion of the risks, benefits and alternatives to treatment. A timeout was performed prior to the initiation of the procedure. Initial ultrasound scanning demonstrates a moderate amount of ascites within the right lower abdominal quadrant. The right lower abdomen was prepped and draped in the usual sterile fashion. 1% lidocaine was used for local anesthesia. Following this, a 19 gauge, 7-cm, Yueh catheter was introduced. An ultrasound image was saved for documentation purposes. The paracentesis was performed. The catheter was removed and a dressing was applied. The patient tolerated the  procedure well without immediate post procedural complication. FINDINGS: A total of approximately 3.6 liters of pale, thin fluid was removed. IMPRESSION: Successful ultrasound-guided therapeutic paracentesis yielding 3.6 liters of peritoneal fluid. Read by: Loyce Dys PA-C Electronically Signed   By: Irish Lack M.D.   On: 10/12/2020 17:17   Recent Labs    10/11/20 0509 10/13/20 0500  WBC 12.7* 12.8*  HGB 9.0* 8.5*  HCT 26.8* 25.8*  PLT 305 284   Recent Labs    10/11/20 0509 10/13/20 0500  NA 128* 130*  K 3.6 3.2*  CL 95* 97*  CO2 26 27  GLUCOSE 91 87  BUN 7 5*  CREATININE 0.36* <0.30*  CALCIUM 7.7* 7.7*    Intake/Output Summary (Last 24 hours) at 10/13/2020 1244 Last data filed at 10/13/2020 0700 Gross per 24 hour  Intake 360 ml  Output 4350 ml  Net -3990 ml        Physical Exam: Vital Signs Blood pressure 105/70, pulse 97, temperature 98.4 F (36.9 C), resp. rate 18, height 5\' 9"  (1.753 m), weight 88 kg, SpO2 93 %.  Gen: no distress, normal appearing HEENT: oral mucosa pink and moist, NCAT Cardio: Reg rate Chest: normal effort, normal rate of breathing Abd: soft, ascites has decreased Extremities: No clubbing, cyanosis, 1+ LLE edema Skin: No evidence of breakdown, no evidence of rash Neurologic: Cranial nerves II through XII intact, motor strength is 5/5 in bilateral deltoid, bicep, tricep, grip,4/5 RIght and 3- Left hip flexor, knee extensors, ankle dorsiflexor  and plantar flexor Sensory exam normal sensation to light touch  in bilateral upper and lower extremities except small patch umbness in suprapatellar area  Musculoskeletal: Full range of motion in all 4 extremities. No joint swelling    Assessment/Plan: 1. Functional deficits which require 3+ hours per day of interdisciplinary therapy in a comprehensive inpatient rehab setting.  Physiatrist is providing close team supervision and 24 hour management of active medical problems listed  below.  Physiatrist and rehab team continue to assess barriers to discharge/monitor patient progress toward functional and medical goals  Care Tool:  Bathing    Body parts bathed by patient: Right arm,Left arm,Chest,Abdomen,Right upper leg,Right lower leg,Left lower leg,Face   Body parts bathed by helper: Front perineal area,Buttocks,Right upper leg,Left upper leg,Right lower leg,Left lower leg Body parts n/a: Front perineal area,Buttocks,Left upper leg   Bathing assist Assist Level: Minimal Assistance - Patient > 75% (in sitting with AE)     Upper Body Dressing/Undressing Upper body dressing   What is the patient wearing?: Hospital gown only    Upper body assist Assist Level: Moderate Assistance - Patient 50 - 74%    Lower Body Dressing/Undressing Lower body dressing    Lower body dressing activity did not occur: Safety/medical concerns (Patient with multiple drains and wound vac) What is the patient wearing?: Hospital gown only     Lower body assist       Toileting Toileting    Toileting assist Assist for toileting: Dependent - Patient 0% (foley)     Transfers Chair/bed transfer  Transfers assist     Chair/bed transfer assist level: Moderate Assistance - Patient 50 - 74% (stand pivot with the RW)     Locomotion Ambulation   Ambulation assist      Assist level: 2 helpers Assistive device: Parallel bars Max distance: 4   Walk 10 feet activity   Assist  Walk 10 feet activity did not occur: Safety/medical concerns        Walk 50 feet activity   Assist Walk 50 feet with 2 turns activity did not occur: Safety/medical concerns         Walk 150 feet activity   Assist Walk 150 feet activity did not occur: Safety/medical concerns         Walk 10 feet on uneven surface  activity   Assist Walk 10 feet on uneven surfaces activity did not occur: Safety/medical concerns         Wheelchair     Assist Will patient use wheelchair at  discharge?: Yes Type of Wheelchair: Manual    Wheelchair assist level: Supervision/Verbal cueing Max wheelchair distance: 150    Wheelchair 50 feet with 2 turns activity    Assist        Assist Level: Supervision/Verbal cueing   Wheelchair 150 feet activity     Assist      Assist Level: Supervision/Verbal cueing   Blood pressure 105/70, pulse 97, temperature 98.4 F (36.9 C), resp. rate 18, height 5\' 9"  (1.753 m), weight 88 kg, SpO2 93 %.     Medical Problem List and Plan: 1.  Debility secondary to septic shock/left gluteal/groin abscesses/septic arthritis with history of avascular necrosis left hip             -patient may not shower             -ELOS/Goals: modI 10-14 days  -Continue CIR 2.  Impaired mobility: -DVT/anticoagulation: Continue SCDs.  Venous Doppler studies negative             -  antiplatelet therapy: N/A 3. Pain Management: Continue MS Contin 15 mg every 12 hours, MSIR 15 mg every 6 hours as needed-using regularly.  4. Depression: Continue Zoloft 50 mg daily.  Provide emotional support             -antipsychotic agents: N/A 5. Neuropsych: This patient is capable of making decisions on his own behalf. 6. Skin/Wound Care: Routine skin checks 7. Fluids/Electrolytes/Nutrition: Routine in and outs with follow-up chemistries 8.  AVN left hip.  Status post left total hip arthroplasty with antibiotic spacer left hip excisional debridement of skin subcutaneous tissue muscle bone deep abscess 09/15/2020 per Dr. Dion Saucier.  Weightbearing as tolerated with posterior hip precautions 9.  Left groin and left hip abscess.  Status post multiple I&D's.  Wound VAC as directed.- Gen Surg-Dr Cornett. May d/c sutures today 10.  ID.  Continue ceftezole 2 g every 8 hours and Flagyl 500 mg every 8 hours and rifampin 300 mg every 12 hours through 10/28/2020 and stop.  Follow-up infectious disease 11.  Hepatic cirrhosis with ascites.  Status post multiple paracentesis.  GI  recommends outpatient EGD.  Latest paracentesis 10/05/2020.  Continue Chronulac 10 mg twice daily  3/23: repeat abdominal ultrasound today given increasing distension 12.  Scrotal edema.  Follow-up urology services Dr. Laverle Patter.  No concern for infection.  Continue Foley tube until edema improved and then plan voiding trial 13.  Anemia/thrombocytopenia.  Follow-up CBC 14.  Hypertension.  Lopressor 50 mg twice daily, Aldactone 100 mg daily, Lasix 40 mg daily.BMET ok, Na+ low but stable will monitor   Monitor with increased mobility 15.  Tobacco abuse.  Counseling 16. Hyponatremia: Na 128 on 3/23-up to 130 on 3/24, repeat Monday 17. Hypoalbuminemia: 1.6, continue high protein foods, recommended girlfriend bring in nuts, consult nephro regarding IV albumin given his anasarca   LOS: 3 days A FACE TO FACE EVALUATION WAS PERFORMED  Drema Pry Hector Venne 10/13/2020, 12:44 PM

## 2020-10-13 NOTE — Progress Notes (Signed)
Patient ID: [name], male   DOB: [birth year], 35 y.o.   MRN: [id number] Met with the patient to review role of the nurse CM and address educational needs. Reviewed medications and dressing changes. Discussed nutrition/supplements ordered for wound healing and treatment of cirrhosis. Patient reports he feels girlfriend and her daughter (nurse) can manage dressing changes; better than they were in the past. Understands current treatment for cirrhosis and plans to continue IV abx through 10/28/20. Continue to follow along to discharge to address questions/concerns/educational needs. Collaborate with the SW to facilitate preparation for discharge. [name]

## 2020-10-13 NOTE — IPOC Note (Signed)
Overall Plan of Care [facility]) Patient Details Name: [name] MRN: [id number] DOB: 1986-03-15  Admitting Diagnosis: <principal problem not specified>  Hospital Problems: Active Problems:   Septic shock (HCC)   Debility     Functional Problem List: Nursing Bladder,Pain,Bowel,Edema,Safety,Endurance,Medication Management  PT Balance,Edema,Endurance,Motor,Nutrition,Pain,Sensory,Skin Integrity,Safety  OT Balance,Edema,Endurance,Motor,Nutrition,Pain,Skin Integrity  SLP    TR         Basic ADL's: OT Grooming,Bathing,Dressing,Toileting     Advanced  ADL's: OT       Transfers: PT Bed Mobility,Bed to Chair,Car,Furniture  OT Toilet,Tub/Shower     Locomotion: PT Ambulation,Wheelchair Mobility,Stairs     Additional Impairments: OT None  SLP        TR      Anticipated Outcomes Item Anticipated Outcome  Self Feeding    Swallowing      Basic self-care  Supervision/min A  Toileting  Supervision   Bathroom Transfers CGA  Bowel/Bladder  to be continent x 2  Transfers  CGA  Locomotion  CGA  Communication     Cognition     Pain  less than 3 out of 10  Safety/Judgment  to remain fall free while in rehab   Therapy Plan: PT Intensity: Minimum of 1-2 x/day ,45 to 90 minutes PT Frequency: 5 out of 7 days PT Duration Estimated Length of Stay: 2.5-3 weeks OT Intensity: Minimum of 1-2 x/day, 45 to 90 minutes OT Frequency: 5 out of 7 days OT Duration/Estimated Length of Stay: 3 weeks     Due to the current state of emergency, patients may not be receiving their 3-hours of Medicare-mandated therapy.   Team Interventions: Nursing Interventions Patient/Family Education,Disease Management/Prevention,Skin Care/Wound Management,Discharge Planning,Bladder Management,Pain Management,Psychosocial Support,Bowel Management,Medication Management  PT interventions Ambulation/gait training,Cognitive remediation/compensation,Discharge planning,DME/adaptive equipment  instruction,Functional mobility training,Pain management,Psychosocial support,Splinting/orthotics,Therapeutic Activities,UE/LE Strength taining/ROM,Visual/perceptual remediation/compensation,Balance/vestibular training,Disease management/prevention,Community reintegration,Functional electrical stimulation,Neuromuscular re-education,Patient/family education,Skin care/wound management,Stair training,Therapeutic Exercise,UE/LE Psychiatrist propulsion/positioning  OT Interventions Balance/vestibular training,Community reintegration,Discharge planning,Disease Psychologist, prison and probation services stimulation,Functional mobility training,Neuromuscular re-education,Pain management,Self Care/advanced ADL retraining,Psychosocial support,Patient/family education,Skin care/wound managment,Therapeutic Activities,Splinting/orthotics,UE/LE Strength taining/ROM,Therapeutic Exercise,Wheelchair propulsion/positioning,Visual/perceptual remediation/compensation,UE/LE Coordination activities  SLP Interventions    TR Interventions    SW/CM Interventions Discharge Planning,Psychosocial Support,Patient/Family Education,Disease Management/Prevention   Barriers to Discharge MD  Medical stability  Nursing      PT Decreased caregiver support,Wound Care,Lack of/limited family support,Weight bearing restrictions    OT      SLP      SW Lack of/limited family support Girlfriend following up with family to see if they can assist   Team Discharge Planning: Destination: PT-Home ,OT- Home , SLP-  Projected Follow-up: PT-Home health PT,24 hour supervision/assistance, OT-  Home health OT, SLP-  Projected Equipment Needs: PT-To be determined, OT- To be determined, SLP-  Equipment Details: PT- , OT-  Patient/family involved in discharge planning: PT- Patient,  OT-Patient, SLP-   MD ELOS: 10-14 days Medical Rehab Prognosis:  Excellent Assessment: [name] is a 35  year old man who is admitted to CIR with debility secondary to septic shock/left gluteal/groin abscesses/septic arthritis with history of avascular necrosis left hip. Active medical issues include abdominal ascites, incisional pain, depression, anemia, thrombocytopenia, hyponatremia, and hypoalbuminemia.    See Team Conference Notes for weekly updates to the plan of care

## 2020-10-14 ENCOUNTER — Encounter (HOSPITAL_COMMUNITY): Payer: Self-pay | Admitting: Physical Medicine & Rehabilitation

## 2020-10-14 LAB — BASIC METABOLIC PANEL
Anion gap: 7 (ref 5–15)
BUN: 8 mg/dL (ref 6–20)
CO2: 24 mmol/L (ref 22–32)
Calcium: 7.9 mg/dL — ABNORMAL LOW (ref 8.9–10.3)
Chloride: 98 mmol/L (ref 98–111)
Creatinine, Ser: <0.3 mg/dL — ABNORMAL LOW (ref 0.61–1.24)
Glucose, Bld: 94 mg/dL (ref 70–99)
Potassium: 4 mmol/L (ref 3.5–5.1)
Sodium: 129 mmol/L — ABNORMAL LOW (ref 135–145)

## 2020-10-14 NOTE — Progress Notes (Signed)
   10/13/20 1939  Assess: MEWS Score  Temp 99.1 F (37.3 C)  BP 109/68  Pulse Rate (!) 113  Resp 14  SpO2 94 %  O2 Device Room Air  Assess: MEWS Score  MEWS Temp 0  MEWS Systolic 0  MEWS Pulse 2  MEWS RR 0  MEWS LOC 0  MEWS Score 2  MEWS Score Color Yellow  Assess: if the MEWS score is Yellow or Red  Were vital signs taken at a resting state? No  Focused Assessment No change from prior assessment  Early Detection of Sepsis Score *See Row Information* Low  MEWS guidelines implemented *See Row Information* Yes  Escalate  MEWS: Escalate Yellow: discuss with charge nurse/RN and consider discussing with provider and RRT  Notify: Charge Nurse/RN  Name of Charge Nurse/RN Notified Toribio Harbour RN  Date Charge Nurse/RN Notified 10/13/20  Time Charge Nurse/RN Notified 1949   Deatra Ina PA notified, continue MEWS protocol.

## 2020-10-14 NOTE — Consult Note (Signed)
WOC Nurse Consult Note: Surgical team following for assessment and plan of care for post-op wounds Vac dressingchange to left thigh Wound type:Full thickness post-op wound; beefy red and moist,mod amt pink drainagein the cannister; continues to decrease in depth and Vac might be appropriate to discontinue next week. Refer to previous progress notes for measurements. Left thigh with 1 piece black foam applied after pt was medicated for pain. Tolerated with mod amt discomfort. Cont suction on at . Sutures intact to lower wound.  WOC team will plan dressing change Q M/W/F Cammie Mcgee MSN, RN, CWOCN, [place], CNS [phone]

## 2020-10-14 NOTE — Progress Notes (Addendum)
PROGRESS NOTE   Subjective/Complaints: Pain is stable -drains can be removed today- nursing order placed Discussed nephrology's response regarding IV albumin Plan for d/c vac next week.   ROS- denies CP, SOB, N/V/D, +incisional pain  Objective:   US Abdomen Limited  Result Date: 10/12/2020 CLINICAL DATA:  Ascites. EXAM: LIMITED ABDOMEN ULTRASOUND FOR ASCITES TECHNIQUE: Limited ultrasound survey for ascites was performed in all four abdominal quadrants. COMPARISON:  October 09, 2020. FINDINGS: Minimal ascites is noted in the left upper quadrant. Mild ascites is noted around the liver in the right upper quadrant. Mild to moderate ascites is noted in both lower quadrants. IMPRESSION: Mild to moderate ascites is noted, with the largest amount seen in right pericolic gutter region. Electronically Signed   By: Lupita Raider M.D.   On: 10/12/2020 15:03   IR Paracentesis  Result Date: 10/12/2020 INDICATION: Patient with abdominal distension, recurrent ascites. Request is made for therapeutic paracentesis. EXAM: ULTRASOUND GUIDED THERAPEUTIC PARACENTESIS MEDICATIONS: 10 mL 1% lidocaine COMPLICATIONS: None immediate. PROCEDURE: Informed written consent was obtained from the patient after a discussion of the risks, benefits and alternatives to treatment. A timeout was performed prior to the initiation of the procedure. Initial ultrasound scanning demonstrates a moderate amount of ascites within the right lower abdominal quadrant. The right lower abdomen was prepped and draped in the usual sterile fashion. 1% lidocaine was used for local anesthesia. Following this, a 19 gauge, 7-cm, Yueh catheter was introduced. An ultrasound image was saved for documentation purposes. The paracentesis was performed. The catheter was removed and a dressing was applied. The patient tolerated the procedure well without immediate post procedural complication. FINDINGS: A  total of approximately 3.6 liters of pale, thin fluid was removed. IMPRESSION: Successful ultrasound-guided therapeutic paracentesis yielding 3.6 liters of peritoneal fluid. Read by: Loyce Dys PA-C Electronically Signed   By: Irish Lack M.D.   On: 10/12/2020 17:17   Recent Labs    10/13/20 0500  WBC 12.8*  HGB 8.5*  HCT 25.8*  PLT 284   Recent Labs    10/13/20 0500 10/14/20 0342  NA 130* 129*  K 3.2* 4.0  CL 97* 98  CO2 27 24  GLUCOSE 87 94  BUN 5* 8  CREATININE <0.30* <0.30*  CALCIUM 7.7* 7.9*    Intake/Output Summary (Last 24 hours) at 10/14/2020 8676 Last data filed at 10/14/2020 0405 Gross per 24 hour  Intake 480 ml  Output 950 ml  Net -470 ml        Physical Exam: Vital Signs Blood pressure 98/66, pulse 98, temperature 98.6 F (37 C), temperature source Oral, resp. rate 17, height 5\' 9"  (1.753 m), weight 88 kg, SpO2 95 %.  Gen: no distress, normal appearing HEENT: oral mucosa pink and moist, NCAT Cardio: Reg rate Chest: normal effort, normal rate of breathing Abd: soft, non-distended Ext: 1+LLE edema Psych: pleasant, normal affect Skin: Left thigh with wound vac to full thickness wound, moderate amount of pink drainage.   Musculoskeletal: Full range of motion in all 4 extremities. No joint swelling    Assessment/Plan: 1. Functional deficits which require 3+ hours per day of interdisciplinary therapy in a comprehensive inpatient rehab setting.  Physiatrist is providing close team supervision and 24 hour management of active medical problems listed below.  Physiatrist and rehab team continue to assess barriers to discharge/monitor patient progress toward functional and medical goals  Care Tool:  Bathing    Body parts bathed by patient: Right arm,Left arm,Chest,Abdomen,Right upper leg,Right lower leg,Left lower leg,Face   Body parts bathed by helper: Front perineal area,Buttocks,Right upper leg,Left upper leg,Right lower leg,Left lower  leg Body parts n/a: Front perineal area,Buttocks,Left upper leg   Bathing assist Assist Level: Minimal Assistance - Patient > 75% (in sitting with AE)     Upper Body Dressing/Undressing Upper body dressing   What is the patient wearing?: Hospital gown only    Upper body assist Assist Level: Moderate Assistance - Patient 50 - 74%    Lower Body Dressing/Undressing Lower body dressing    Lower body dressing activity did not occur: Safety/medical concerns (Patient with multiple drains and wound vac) What is the patient wearing?: Hospital gown only     Lower body assist       Toileting Toileting    Toileting assist Assist for toileting: Dependent - Patient 0% (foley)     Transfers Chair/bed transfer  Transfers assist     Chair/bed transfer assist level: Moderate Assistance - Patient 50 - 74% Chair/bed transfer assistive device: Arboriculturist assist      Assist level: 2 helpers (min A of 1 and +2 line management) Assistive device: Walker-rolling Max distance: 66ft   Walk 10 feet activity   Assist  Walk 10 feet activity did not occur: Safety/medical concerns  Assist level: 2 helpers (min A of 1 and +2 line management) Assistive device: Walker-rolling   Walk 50 feet activity   Assist Walk 50 feet with 2 turns activity did not occur: Safety/medical concerns         Walk 150 feet activity   Assist Walk 150 feet activity did not occur: Safety/medical concerns         Walk 10 feet on uneven surface  activity   Assist Walk 10 feet on uneven surfaces activity did not occur: Safety/medical concerns         Wheelchair     Assist Will patient use wheelchair at discharge?: Yes Type of Wheelchair: Manual    Wheelchair assist level: Supervision/Verbal cueing Max wheelchair distance: 150    Wheelchair 50 feet with 2 turns activity    Assist        Assist Level: Supervision/Verbal cueing    Wheelchair 150 feet activity     Assist      Assist Level: Supervision/Verbal cueing   Blood pressure 98/66, pulse 98, temperature 98.6 F (37 C), temperature source Oral, resp. rate 17, height 5\' 9"  (1.753 m), weight 88 kg, SpO2 95 %.     Medical Problem List and Plan: 1.  Debility secondary to septic shock/left gluteal/groin abscesses/septic arthritis with history of avascular necrosis left hip             -patient may not shower             -ELOS/Goals: modI 10-14 days  -Continue CIR 2.  Impaired mobility: -DVT/anticoagulation: Continue SCDs.  Venous Doppler studies negative             -antiplatelet therapy: N/A 3. Pain Management: Continue MS Contin 15 mg every 12 hours, MSIR 15 mg every 6 hours as needed-using regularly.  4. Depression: Continue Zoloft 50 mg daily.  Provide  emotional support             -antipsychotic agents: N/A 5. Neuropsych: This patient is capable of making decisions on his own behalf. 6. Skin/Wound Care: Routine skin checks 7. Fluids/Electrolytes/Nutrition: Routine in and outs with follow-up chemistries 8.  AVN left hip.  Status post left total hip arthroplasty with antibiotic spacer left hip excisional debridement of skin subcutaneous tissue muscle bone deep abscess 09/15/2020 per Dr. Dion Saucier.  Weightbearing as tolerated with posterior hip precautions 9.  Left groin and left hip abscess.  Status post multiple I&D's.  Wound VAC as directed-can likely be removed next week.- Gen Surg-Dr Cornett. May d/c sutures and drains.  10.  ID.  Continue ceftezole 2 g every 8 hours and Flagyl 500 mg every 8 hours and rifampin 300 mg every 12 hours through 10/28/2020 and stop.  Follow-up infectious disease 11.  Hepatic cirrhosis with ascites.  Status post multiple paracentesis.  GI recommends outpatient EGD.  Latest paracentesis 10/12/2020.  Continue Chronulac 10 mg twice daily.   3/25: repeat abdominal ultrasound today given increasing distension 12.  Scrotal edema.   Follow-up urology services Dr. Laverle Patter.  No concern for infection.  Continue Foley tube until edema improved and then plan voiding trial 13.  Anemia/thrombocytopenia.  Follow-up CBC 14.  Hypertension.  Lopressor 50 mg twice daily, Aldactone 100 mg daily, Lasix 40 mg daily.BMET ok, Na+ low but stable will monitor   Monitor with increased mobility. Repeat CMP Monday 15.  Tobacco abuse.  Counseling 16. Hyponatremia: Na 128 on 3/23-up to 130 on 3/24, repeat Monday 17. Hypoalbuminemia: 1.6, continue high protein foods, recommended girlfriend bring in nuts, consult nephro regarding IV albumin given his anasarca- they will continue to monitor for need   LOS: 4 days A FACE TO FACE EVALUATION WAS PERFORMED  Melisse Caetano P Annabell Oconnor 10/14/2020, 9:22 AM

## 2020-10-14 NOTE — Plan of Care (Signed)
  Problem: Consults Goal: RH GENERAL PATIENT EDUCATION Description: See Patient Education module for education specifics. Outcome: Progressing Goal: Skin Care Protocol Initiated - if Braden Score 18 or less Description: If consults are not indicated, leave blank or document N/A Outcome: Progressing Goal: Nutrition Consult-if indicated Outcome: Progressing   Problem: RH BOWEL ELIMINATION Goal: RH STG MANAGE BOWEL WITH ASSISTANCE Description: STG Manage Bowel with mod I Assistance. Outcome: Progressing   Problem: RH BLADDER ELIMINATION Goal: RH STG MANAGE BLADDER WITH ASSISTANCE Description: STG Manage Bladder With mod I Assistance Outcome: Progressing Goal: RH STG MANAGE BLADDER WITH EQUIPMENT WITH ASSISTANCE Description: STG Manage Bladder With Equipment With  min Assistance Outcome: Progressing   Problem: RH SKIN INTEGRITY Goal: RH STG SKIN FREE OF INFECTION/BREAKDOWN Description: Skin will be free of infection/breakdown with min assist Outcome: Progressing Goal: RH STG MAINTAIN SKIN INTEGRITY WITH ASSISTANCE Description: STG Maintain Skin Integrity With min Assistance. Outcome: Progressing Goal: RH STG ABLE TO PERFORM INCISION/WOUND CARE W/ASSISTANCE Description: STG Able To Perform Incision/Wound Care With min Assistance. Outcome: Progressing   Problem: RH SAFETY Goal: RH STG ADHERE TO SAFETY PRECAUTIONS W/ASSISTANCE/DEVICE Description: STG Adhere to Safety Precautions With cues/reminders Assistance/Device. Outcome: Progressing   Problem: RH PAIN MANAGEMENT Goal: RH STG PAIN MANAGED AT OR BELOW PT'S PAIN GOAL Description: Pain will be managed at of below 3 out of 10 on pain scale with min assist Outcome: Progressing   Problem: RH KNOWLEDGE DEFICIT GENERAL Goal: RH STG INCREASE KNOWLEDGE OF SELF CARE AFTER HOSPITALIZATION Description: Patient will be able to manage self care at discharge using handouts and educational materials with cues/reminders Outcome:  Progressing   

## 2020-10-14 NOTE — Progress Notes (Signed)
Occupational Therapy Session Note  Patient Details  Name: [name] MRN: [id number] Date of Birth: 1985/08/12  Today's Date: 10/14/2020 OT Individual Time: 6720-9470 OT Individual Time Calculation (min): 58 min    Short Term Goals: Week 1:  OT Short Term Goal 1 (Week 1): Patient will complete sit<>stand in Stedy with mod A OT Short Term Goal 2 (Week 1): Pt will tolerate sitting EOB for 10 minutes within BADL task OT Short Term Goal 3 (Week 1): Pt will use LH sponge to help was LEs  Skilled Therapeutic Interventions/Progress Updates:    Pt in bed to start session, reporting not feeling well today with some stomach issues and nausea earlier.  He was in agreement to work with OT on selfcare tasks EOB.  Min assist for advancing the LLE over the edge of the bed with transition to sitting.  He completed all UB selfcare with supervision and then removed his gripper socks with use of the reacher secondary to his hip precautions.  He then used the LH sponge for washing his lower legs and feet with setup.  Mod assist was needed for standing to wash his buttocks.  He donned a gown with mod assist secondary having drains and a wound vac in place.  Therapist then donned his TEDs and pt completed donning gripper socks with min assist.  Finished session with transfer to the wheelchair with mod assist to complete session.  Pt was left with PT for next session.    Therapy Documentation Precautions:  Precautions Precautions: Fall,Posterior Hip,Other (comment) Precaution Comments: foley, wound vac L thigh, L JP drains L thigh Restrictions Weight Bearing Restrictions: No LLE Weight Bearing: Weight bearing as tolerated Other Position/Activity Restrictions: 3 JP drains in Lt anterior thigh and wound vac  Pain: Pain Assessment Pain Scale: Faces Faces Pain Scale: Hurts little more Pain Type: Surgical pain Pain Location: Hip Pain Orientation: Left Pain Descriptors / Indicators: Discomfort Pain  Onset: With Activity Pain Intervention(s): Repositioned ADL: See Care Tool Section for some details of mobility and selfcare  Therapy/Group: Individual Therapy  Dailan Pfalzgraf OTR/L 10/14/2020, 4:17 PM

## 2020-10-14 NOTE — Progress Notes (Signed)
Physical Therapy Session Note  Patient Details  Name: [name] MRN: [id number] Date of Birth: [birth year]  Today's Date: 10/14/2020 PT Individual Time: 1400-1429 PT Individual Time Calculation (min): 29 min   Short Term Goals: Week 1:  PT Short Term Goal 1 (Week 1): Patient will complete bed mobility with ModA PT Short Term Goal 2 (Week 1): Patient will ambulate at least 70ft with LRAD and +2 assist as needed, MaxA PT Short Term Goal 3 (Week 1): Patient will transfer bed <> wc with LRAD and MaxA  Skilled Therapeutic Interventions/Progress Updates:     Pt received seated in [facility] and agrees to therapy. Pt reports pain in L leg. Number not provided. PT provides rest breaks and repositioning to manage pain. WC transport to gym for time management. Pt performs sit to stand with modA. Pt then ambulates x24' with RW and minA, with PT providing manual facilitation of L hip extension, though no buckling noted. Pt then performs 2x10 seated dips in WC to strengthen extensor muscles in bilateral lower extremities. Pt left seated in WC with all needs within reach.  Therapy Documentation Precautions:  Precautions Precautions: Fall,Posterior Hip,Other (comment) Precaution Comments: foley, wound vac L thigh, L JP drains L thigh Restrictions Weight Bearing Restrictions: No LLE Weight Bearing: Weight bearing as tolerated Other Position/Activity Restrictions: 3 JP drains in Lt anterior thigh and wound vac    Therapy/Group: Individual Therapy  Beau Fanny, PT, DPT 10/14/2020, 2:24 PM

## 2020-10-14 NOTE — Progress Notes (Signed)
Physical Therapy Session Note  Patient Details  Name: [name] MRN: [id number] Date of Birth: August 27, 1985  Today's Date: 10/14/2020 PT Individual Time: 1500-1525 PT Individual Time Calculation (min): 25 min   Short Term Goals: Week 1:  PT Short Term Goal 1 (Week 1): Patient will complete bed mobility with ModA PT Short Term Goal 2 (Week 1): Patient will ambulate at least 53ft with LRAD and +2 assist as needed, MaxA PT Short Term Goal 3 (Week 1): Patient will transfer bed <> wc with LRAD and MaxA  Skilled Therapeutic Interventions/Progress Updates:    Patient received sitting up in wc, agreeable to PT session to his tolerance. He reports 6/10 pain in L hip. He states that he wants to wait on pain rx until before JP drains are removed to ensure rx coverage for that procedure. PT observing significant difference in patients abdomen with more distention noted now compared to AM session. Patient also reporting increased discomfort related to this- RN made aware. He was able to complete sit >stand with RW and MinA x2. Lateral weight shifts completed with patient reporting tolerating <25% weightbearing on L LE. Patient requesting to return to bed due to pain. Patient able to ambulate to bed with RW and MinA, 2nd person present to assist in managing lines. MaxA to return supine due to pain. PT observing increased drainage from L hip incision caught by bandage, RN made aware. Patient remaining supine in bed, bed alarm on, call light within reach.   Therapy Documentation Precautions:  Precautions Precautions: Fall,Posterior Hip,Other (comment) Precaution Comments: foley, wound vac L thigh, L JP drains L thigh Restrictions Weight Bearing Restrictions: No LLE Weight Bearing: Weight bearing as tolerated Other Position/Activity Restrictions: 3 JP drains in Lt anterior thigh and wound vac    Therapy/Group: Individual Therapy  Elizebeth Koller, PT, DPT, CBIS  10/14/2020,  3:25 PM

## 2020-10-14 NOTE — Progress Notes (Signed)
3 JP drains removed by this nurse. Incisions noted with scant amount of drainage serosanguineous. Cleansed with sterile water and pat dried. Covered with ABD pad and secured with tape. No c/o pain or discomfort.

## 2020-10-14 NOTE — Progress Notes (Signed)
Physical Therapy Session Note  Patient Details  Name: [name] MRN: [id number] Date of Birth: 1985/08/15  Today's Date: 10/14/2020 PT Individual Time: 1000-1100 PT Individual Time Calculation (min): 60 min   Short Term Goals: Week 1:  PT Short Term Goal 1 (Week 1): Patient will complete bed mobility with ModA PT Short Term Goal 2 (Week 1): Patient will ambulate at least 68ft with LRAD and +2 assist as needed, MaxA PT Short Term Goal 3 (Week 1): Patient will transfer bed <> wc with LRAD and MaxA  Skilled Therapeutic Interventions/Progress Updates:    Patient received supine in bed, agreeable to PT. He reports 6/10 pain in L thigh/hip, premedicated. PT providing rest breaks, distractions and repositioning to assist with pain management. He was able to come sit edge of bed with MinA for L LE management. Stedy used to transfer to [employer] with MinA +2 to stand. He was able to propel himself in the wc with supervision using B UE. Sit <>stand blocked practice from varying height mat with RW. At ~25", patient required CGA of 1. At lowering mat heights, patient requiring up to MinA x2. He does have the tendency to invert L ankle. PT assessed: increased swelling to dorsal surface of foot + lateral surface with tight hamstring/heel cord. He was able to volitionally prevent L ankle supination when attention was drawn to it. Limitations in L subtalar joint noted as well. He was able to ambulate 44ft with RW and MinA with wc follow for safety/line management. Patient able to achieve flat foot on L, as opposed to walking on his toes, which is improving since previous sessions. He reports tolerating increased weight shift to the L as well. Patient requesting to transfer back to bed, Stedy used with ModA for standing. MinA to return supine to manage L LE. Patient remaining in bed, bed alarm on, call light within reach.   Therapy Documentation Precautions:  Precautions Precautions: Fall,Posterior Hip,Other  (comment) Precaution Comments: foley, wound vac L thigh, L JP drains L thigh Restrictions Weight Bearing Restrictions: No LLE Weight Bearing: Weight bearing as tolerated Other Position/Activity Restrictions: 3 JP drains in Lt anterior thigh and wound vac    Therapy/Group: Individual Therapy  Elizebeth Koller, PT, DPT, CBIS  10/14/2020, 7:45 AM

## 2020-10-15 ENCOUNTER — Inpatient Hospital Stay (HOSPITAL_COMMUNITY): Payer: 59

## 2020-10-15 MED ORDER — CHLORHEXIDINE GLUCONATE CLOTH 2 % EX PADS
6.0000 | MEDICATED_PAD | Freq: Two times a day (BID) | CUTANEOUS | Status: DC
  Administered 2020-10-15 – 2020-10-28 (×21): 6 via TOPICAL

## 2020-10-15 NOTE — Progress Notes (Signed)
Occupational Therapy Session Note  Patient Details  Name: [name] MRN: [id number] Date of Birth: 03-23-1986  Today's Date: 10/15/2020 OT Individual Time: 2831-5176 OT Individual Time Calculation (min): 58 min    Short Term Goals: Week 1:  OT Short Term Goal 1 (Week 1): Patient will complete sit<>stand in Stedy with mod A OT Short Term Goal 2 (Week 1): Pt will tolerate sitting EOB for 10 minutes within BADL task OT Short Term Goal 3 (Week 1): Pt will use LH sponge to help was LEs  Skilled Therapeutic Interventions/Progress Updates:    Session 1: (1607-3710)  Pt in bed to start with significant other present.  He needed min assist to help advance the LLE off of the bed with supine to sit and HOB elevate approximately 30 degrees.  He then completed stand pivot transfer to the wheelchair at mod assist from EOB with use of the RW.  Took him down to the therapy gym where he transferred at the same level of mod assist to the therapy mat.  Worked on sit to stand and standing balance from the EOM at different heights with min to mod assist while engaged in peg design activity.  He was able to maintain standing balance with overall min guard assist while using the LUE for design.  Slight increased difficulty noted when having to bend down slightly to pick up the pegs with increased fatigue noted.  He tolerated standing for intervals of 2-4 mins at a time.  HR in the mid 90s as well as O2 on room air.  Finished session with functional mobility with min assist for 40' using the RW for support.  He was pushed back to the room the rest of the way via wheelchair to conclude session.  Call button and phone in reach with his significant other present.    Session 2: (6269-4854)  Pt sitting up in wheelchair to start session.  Had him work on BUE strengthening exercises for the rotator cuff and his triceps with use of the medium level resistant orange band.  He completed 1 set of 15 reps for shoulder  horizontal abduction, horizontal abduction with external rotation component, external rotation with arm at his side, and for elbow extension.  Min instructional cueing was needed for each exercise.  Pt with report of a history of right shoulder pain secondary to questionable impingement so avoided shoulder flexion exercises with focus on strengthening posterior shoulder and rotator cuff.  Finished session with mod assist transfer back to the bed with use of the RW for support.  Call button and phone in reach with safety alarm in place.   Therapy Documentation Precautions:  Precautions Precautions: Fall,Posterior Hip,Other (comment) Precaution Comments: foley, wound vac L thigh, L JP drains L thigh Restrictions Weight Bearing Restrictions: No LLE Weight Bearing: Weight bearing as tolerated Other Position/Activity Restrictions: 3 JP drains in Lt anterior thigh and wound vac   Pain: Pain Assessment Pain Scale: 0-10 Pain Score: 4  Pain Location: Hip Pain Orientation: Left Pain Radiating Towards: groin, left knee Pain Intervention(s): Medication (See eMAR) ADL: See Care Tool Section for some details of mobility and selfcare  Therapy/Group: Individual Therapy  [name] OTR/L 10/15/2020, 12:44 PM

## 2020-10-15 NOTE — Progress Notes (Signed)
Physical Therapy Session Note  Patient Details  Name: [name] MRN: [id number] Date of Birth: 1985/10/26  Today's Date: 10/15/2020 PT Individual Time: 0800-0900; 9833-8250 PT Individual Time Calculation (min): 60 min and 45 mins  Short Term Goals: Week 1:  PT Short Term Goal 1 (Week 1): Patient will complete bed mobility with ModA PT Short Term Goal 2 (Week 1): Patient will ambulate at least 61ft with LRAD and +2 assist as needed, MaxA PT Short Term Goal 3 (Week 1): Patient will transfer bed <> wc with LRAD and MaxA  Skilled Therapeutic Interventions/Progress Updates:    Session 1: Patient received asleep in bed, but easy to wake, agreeable to PT. He reports 5-6/10 pain in L hip, premedicated. PT providing rest breaks, distractions, repositioning to assist with pain management. Patient reports receiving [place] this AM, but no report on whether he will need additional paracentesis or not at this time. He was able to come sit edge of bed with MinA for L LE management and extended time due to pain. ModA sit >stand from edge of raised bed. He was able to ambulate into bathroom with RW and MinA. Patient continent of bowel, TotalA for perihygiene in standing. Morning ADLs completed seated at sink with set up assist. Patient propelling himself in wc x115ft to therapy gym with supervision and assist for managing wound vac. AROM L LE in sitting 3x2' into knee flex/extend. Patient able to achieve ~70% full ROM into knee extension and ~50% knee flex. PT providing prolonged stretch into knee extension 2x1' at 90% AROM to patient tolerance. He returned to room in wc, transferring back to bed via ambulation with MinA and MInA to return supine. Bed alarm on, call light within reach.   Session 2: Patient received sitting up in wc, agreeable to PT. He reports 5/10 pain in L hip, premedicated. PT providing rest breaks, distractions and repositioning to assist with pain management. PT transporting patient in wc to  therapy gym for time management and energy conservation. He completed the Kinetron with B LE at 60cm/s 3x65mins with emphasis on ROM/weight bearing L LE. He tolerated ~25% hip flex ROM and was able to push down with LLE onto pedal. PT performing effleurage to B feet due to significant increase in swelling. He may benefit from thigh high TED hose on R LE and/or ACE wraps to assist with edema management. PT discussed with patient and gf progress through therapy and bed level exercises that he is able to complete on his own to his tolerance. At end of session, patient remaining up in wc, gf at bedside, call light within reach.   Therapy Documentation Precautions:  Precautions Precautions: Fall,Posterior Hip,Other (comment) Precaution Comments: foley, wound vac L thigh, L JP drains L thigh Restrictions Weight Bearing Restrictions: No LLE Weight Bearing: Weight bearing as tolerated Other Position/Activity Restrictions: 3 JP drains in Lt anterior thigh and wound vac    Therapy/Group: Individual Therapy  Elizebeth Koller, PT, DPT, CBIS  10/15/2020, 7:32 AM

## 2020-10-16 DIAGNOSIS — R188 Other ascites: Secondary | ICD-10-CM

## 2020-10-16 DIAGNOSIS — E871 Hypo-osmolality and hyponatremia: Secondary | ICD-10-CM

## 2020-10-16 DIAGNOSIS — I10 Essential (primary) hypertension: Secondary | ICD-10-CM

## 2020-10-16 DIAGNOSIS — M8700 Idiopathic aseptic necrosis of unspecified bone: Secondary | ICD-10-CM

## 2020-10-16 DIAGNOSIS — L02416 Cutaneous abscess of left lower limb: Secondary | ICD-10-CM

## 2020-10-16 NOTE — Progress Notes (Addendum)
PROGRESS NOTE   Subjective/Complaints: Patient seen laying in bed this morning.  He states he slept well overnight.  None procedure went fine yesterday.  He denies complaints.  ROS: Denies CP, SOB, N/V/D  Objective:   US Abdomen Limited  Result Date: 10/15/2020 CLINICAL DATA:  Evaluate for ascites. EXAM: LIMITED ABDOMEN ULTRASOUND FOR ASCITES TECHNIQUE: Limited ultrasound survey for ascites was performed in all four abdominal quadrants. COMPARISON:  10/12/2020. FINDINGS: Four quadrant survey of the abdomen shows a small volume of ascites within the right upper quadrant, right lower quadrant and left lower quadrant of the abdomen. This appears decreased when compared with 10/12/2020. IMPRESSION: 1. Persistent but decreased volume of ascites as described above Electronically Signed   By: Signa Kell M.D.   On: 10/15/2020 13:06   No results for input(s): WBC, HGB, HCT, PLT in the last 72 hours. Recent Labs    10/14/20 0342  NA 129*  K 4.0  CL 98  CO2 24  GLUCOSE 94  BUN 8  CREATININE <0.30*  CALCIUM 7.9*    Intake/Output Summary (Last 24 hours) at 10/16/2020 1640 Last data filed at 10/16/2020 0930 Gross per 24 hour  Intake 700 ml  Output 1625 ml  Net -925 ml        Physical Exam: Vital Signs Blood pressure 105/72, pulse 90, temperature 99 F (37.2 C), resp. rate 18, height 5\' 9"  (1.753 m), weight 84.8 kg, SpO2 98 %. Constitutional: No distress . Vital signs reviewed. HENT: Normocephalic.  Atraumatic. Eyes: EOMI. No discharge. Cardiovascular: No JVD.  RRR. Respiratory: Normal effort.  No stridor.  Bilateral clear to auscultation. GI: Non-distended.  BS +. GU: + Foley Skin: Warm and dry.  Left thigh with VAC Psych: Normal mood.  Normal behavior. Musc: Bilateral lower extremity edema Neuro: Alert Bilateral upper extremities: 5/5 proximal distal Right lower extremity: 4/5 proximal distally left lower extremity:  Hip flexion, knee extension 2+/5 (pain inhibition)   Assessment/Plan: 1. Functional deficits which require 3+ hours per day of interdisciplinary therapy in a comprehensive inpatient rehab setting.  Physiatrist is providing close team supervision and 24 hour management of active medical problems listed below.  Physiatrist and rehab team continue to assess barriers to discharge/monitor patient progress toward functional and medical goals  Care Tool:  Bathing    Body parts bathed by patient: Right arm,Left arm,Chest,Abdomen,Right upper leg,Right lower leg,Left lower leg,Face,Buttocks   Body parts bathed by helper: Front perineal area,Left upper leg Body parts n/a: Front perineal area,Buttocks,Left upper leg   Bathing assist Assist Level: Moderate Assistance - Patient 50 - 74%     Upper Body Dressing/Undressing Upper body dressing   What is the patient wearing?: Hospital gown only    Upper body assist Assist Level: Moderate Assistance - Patient 50 - 74%    Lower Body Dressing/Undressing Lower body dressing    Lower body dressing activity did not occur: Safety/medical concerns (Patient with multiple drains and wound vac) What is the patient wearing?: Hospital gown only     Lower body assist Assist for lower body dressing: Moderate Assistance - Patient 50 - 74%     Toileting Toileting    Toileting assist Assist  for toileting: Dependent - Patient 0% (foley)     Transfers Chair/bed transfer  Transfers assist     Chair/bed transfer assist level: Moderate Assistance - Patient 50 - 74% Chair/bed transfer assistive device: Arboriculturist assist      Assist level: Minimal Assistance - Patient > 75% Assistive device: Walker-rolling Max distance: 40'   Walk 10 feet activity   Assist  Walk 10 feet activity did not occur: Safety/medical concerns  Assist level: Minimal Assistance - Patient > 75% Assistive device: Walker-rolling    Walk 50 feet activity   Assist Walk 50 feet with 2 turns activity did not occur: Safety/medical concerns         Walk 150 feet activity   Assist Walk 150 feet activity did not occur: Safety/medical concerns         Walk 10 feet on uneven surface  activity   Assist Walk 10 feet on uneven surfaces activity did not occur: Safety/medical concerns         Wheelchair     Assist Will patient use wheelchair at discharge?: Yes Type of Wheelchair: Manual    Wheelchair assist level: Supervision/Verbal cueing Max wheelchair distance: 150    Wheelchair 50 feet with 2 turns activity    Assist        Assist Level: Supervision/Verbal cueing   Wheelchair 150 feet activity     Assist      Assist Level: Supervision/Verbal cueing   Blood pressure 105/72, pulse 90, temperature 99 F (37.2 C), resp. rate 18, height 5\' 9"  (1.753 m), weight 84.8 kg, SpO2 98 %.     Medical Problem List and Plan: 1.  Debility secondary to septic shock/left gluteal/groin abscesses/septic arthritis with history of avascular necrosis left hip  Continue CIR 2.  Impaired mobility: -DVT/anticoagulation: Continue SCDs.  Venous Doppler studies negative             -antiplatelet therapy: N/A 3. Pain Management: Continue MS Contin 15 mg every 12 hours, MSIR 15 mg every 6 hours as needed-using regularly.   Controlled with meds on 3/27 4. Depression: Continue Zoloft 50 mg daily.  Provide emotional support             -antipsychotic agents: N/A 5. Neuropsych: This patient is capable of making decisions on his own behalf. 6. Skin/Wound Care: Routine skin checks 7. Fluids/Electrolytes/Nutrition: Routine in and outs 8.  AVN left hip.  Status post left total hip arthroplasty with antibiotic spacer left hip excisional debridement of skin subcutaneous tissue muscle bone deep abscess 09/15/2020 per Dr. 09/17/2020.    Weightbearing as tolerated with posterior hip precautions 9.  Left groin and left  hip abscess.  Status post multiple I&D's.  Wound VAC as directed-can likely be removed next week.- Gen Surg-Dr Cornett. May d/c sutures and drains.  10.  ID.  Continue ceftezole 2 g every 8 hours and Flagyl 500 mg every 8 hours and rifampin 300 mg every 12 hours through 10/28/2020 and stop.  Follow-up infectious disease  Persistent leukocytosis of 12.8 on 3/24 11.  Hepatic cirrhosis with ascites.  Status post multiple paracentesis.  GI recommends outpatient EGD.  Latest paracentesis 10/12/2020.  Continue Chronulac 10 mg twice daily.   Ultrasound on 10/15/2020 showing slight decrease in ascites, will order repeat paracentesis 12.  Scrotal edema.  Follow-up urology services Dr. 10/17/2020.  No concern for infection.  Continue Foley tube until edema improved and then plan voiding trial 13.  Anemia/thrombocytopenia.  Hemoglobin 8.5 on 3/24, continue to monitor 14.  Hypertension.  Lopressor 50 mg twice daily, Aldactone 100 mg daily, Lasix 40 mg daily  Controlled on 3/27 15.  Tobacco abuse.  Counseling 16. Hyponatremia:   Sodium 129 on 3/25, continue to monitor 17. Hypoalbuminemia: 1.6, continue high protein foods, recommended girlfriend bring in nuts, consult nephro regarding IV albumin given his anasarca- they will continue to monitor for need   LOS: 6 days A FACE TO FACE EVALUATION WAS PERFORMED  [name] 10/16/2020, 4:40 PM

## 2020-10-17 ENCOUNTER — Inpatient Hospital Stay (HOSPITAL_COMMUNITY): Payer: 59

## 2020-10-17 HISTORY — PX: IR PARACENTESIS: IMG2679

## 2020-10-17 LAB — COMPREHENSIVE METABOLIC PANEL
ALT: 5 U/L (ref 0–44)
AST: 23 U/L (ref 15–41)
Albumin: 1.4 g/dL — ABNORMAL LOW (ref 3.5–5.0)
Alkaline Phosphatase: 148 U/L — ABNORMAL HIGH (ref 38–126)
Anion gap: 4 — ABNORMAL LOW (ref 5–15)
BUN: 5 mg/dL — ABNORMAL LOW (ref 6–20)
CO2: 27 mmol/L (ref 22–32)
Calcium: 7.5 mg/dL — ABNORMAL LOW (ref 8.9–10.3)
Chloride: 101 mmol/L (ref 98–111)
Creatinine, Ser: 0.35 mg/dL — ABNORMAL LOW (ref 0.61–1.24)
GFR, Estimated: >60 mL/min (ref >60)
Glucose, Bld: 93 mg/dL (ref 70–99)
Potassium: 3.6 mmol/L (ref 3.5–5.1)
Sodium: 132 mmol/L — ABNORMAL LOW (ref 135–145)
Total Bilirubin: 0.6 mg/dL (ref 0.3–1.2)
Total Protein: 5 g/dL — ABNORMAL LOW (ref 6.5–8.1)

## 2020-10-17 MED ORDER — LIDOCAINE HCL 1 % IJ SOLN
INTRAMUSCULAR | Status: AC
  Filled 2020-10-17: qty 20

## 2020-10-17 MED ORDER — LIDOCAINE HCL (PF) 1 % IJ SOLN
INTRAMUSCULAR | Status: DC | PRN
  Administered 2020-10-17: 10 mL

## 2020-10-17 NOTE — Procedures (Signed)
PROCEDURE SUMMARY:  Successful image-guided paracentesis from the right lateral abdomen.  Yielded 2.0 liters of clear yellow fluid.  No immediate complications.  EBL = 0 mL. Patient tolerated well.   Specimen was not sent for labs.  Please see imaging section of Epic for full dictation.   Gordy Councilman Mikiyah Glasner PA-C 10/17/2020 11:22 AM

## 2020-10-17 NOTE — Progress Notes (Signed)
Physical Therapy Session Note  Patient Details  Name: [name] MRN: [id number] Date of Birth: 09-10-1985  Today's Date: 10/17/2020 PT Individual Time: 4599-7741 PT Individual Time Calculation (min): 62 min   Short Term Goals: Week 1:  PT Short Term Goal 1 (Week 1): Patient will complete bed mobility with ModA PT Short Term Goal 2 (Week 1): Patient will ambulate at least 44ft with LRAD and +2 assist as needed, MaxA PT Short Term Goal 3 (Week 1): Patient will transfer bed <> wc with LRAD and MaxA  Skilled Therapeutic Interventions/Progress Updates:    Patient received sitting up in wc agreeable to PT. He reports 5/10 pain in L hip, premedicated. PT providing rest breaks, distractions and repositioning to assist with pain management. He was able to propel himself in wc x251ft with supervision and assist for line management. He transferred to therapy mat via ambulatory transfer with RW and ModA x2 to stand up from low surface. MinA to achieve supine for L LE management. Heel slides, hip abduction, isometric hip extension 2x12 completed in all directions with rest breaks due to pain. Patient able to sit edge of mat with MinA for L LE management. Ambulating 20ft with RW and CGA, 2nd assist for line management. Patient able to achieve full L foot contact, but continues to rely heavily on B UE during stance on L LE to assist with pain management. Patient returning to room in wc, transferring back to bed via ambulatory transfer with CGA. Bed alarm on, call light within reach.   Therapy Documentation Precautions:  Precautions Precautions: Fall,Posterior Hip,Other (comment) Precaution Comments: foley, wound vac L thigh, L JP drains L thigh Restrictions Weight Bearing Restrictions: No LLE Weight Bearing: Weight bearing as tolerated Other Position/Activity Restrictions: 3 JP drains in Lt anterior thigh and wound vac   Therapy/Group: Individual Therapy  Elizebeth Koller,  PT, DPT, CBIS  10/17/2020, 7:43 AM

## 2020-10-17 NOTE — Progress Notes (Signed)
Physical Therapy Session Note  Patient Details  Name: [name] MRN: [id number] Date of Birth: [birth year]  Today's Date: 10/17/2020 PT Individual Time: 0915-1000 PT Individual Time Calculation (min): 45 min   Short Term Goals: Week 1:  PT Short Term Goal 1 (Week 1): Patient will complete bed mobility with ModA PT Short Term Goal 2 (Week 1): Patient will ambulate at least 38ft with LRAD and +2 assist as needed, MaxA PT Short Term Goal 3 (Week 1): Patient will transfer bed <> wc with LRAD and MaxA  Skilled Therapeutic Interventions/Progress Updates:    Session focused on functional bed mobility retraining, sit <> stands, w/c mobility for UE strengthening/endurance and functional mobility, and gait training with RW for mobility training. Pt requires extra time for movement in bed due to pain in LLE. Reports just having wound vac removed. Noted some drainage on one spot on L thigh and RN applied bandage. 2 attempts needed and bed height elevated for sit > stand with mod assist with RW to transfer to w/c with min assist for balance and negotiation with IV pole by staff. Pt performed oral hygiene at sink independently. PT donned Tedhose for edema management total assist. Pt proplled w/c with supervision due to needing assist with IV pole x 120' to tolerance due to pain in shoulder (prior impingement issues). Focused on sit > stand technique and adherence to precautions and educated on various surface heights impacting technique requiring mod assist overall each attempt. Pt instructed in gait training x 25' to tolerance due to pain and fatigue with focus on increasing WB tolerance to LLE and functional mobility/strengthening.  Therapy Documentation Precautions:  Precautions Precautions: Fall,Posterior Hip,Other (comment) Precaution Comments: foley, wound vac L thigh removed 3/28, L JP drains L thigh also removed Restrictions Weight Bearing Restrictions: No LLE Weight Bearing: Weight bearing  as tolerated  Pain:  4/10 pain in LLE - premedicated.    Therapy/Group: Individual Therapy  Karolee Stamps Darrol Poke, PT, DPT, CBIS  10/17/2020, 11:56 AM

## 2020-10-17 NOTE — Progress Notes (Signed)
Occupational Therapy Session Note  Patient Details  Name: [name] MRN: [id number] Date of Birth: 1985-11-01  Today's Date: 10/17/2020 OT Individual Time: 1349-1430 OT Individual Time Calculation (min): 41 min   Short Term Goals: Week 1:  OT Short Term Goal 1 (Week 1): Patient will complete sit<>stand in Stedy with mod A OT Short Term Goal 2 (Week 1): Pt will tolerate sitting EOB for 10 minutes within BADL task OT Short Term Goal 3 (Week 1): Pt will use LH sponge to help was LEs     Skilled Therapeutic Interventions/Progress Updates:    Pt greeted in the bed with no c/o pain at rest. Supine<sit completed with supervision using the bedrail with HOB raised. While EOB, OT educated pt on using the shoe horn and also shoe funnel + reacher to increase functional independence with donning footwear. Pt used the reacher to doff both socks unassisted. Due to swelling, limited Lt LE mobility, and pain with movement, pt needed assistance to use AE functionally on the Lt side. Ultimately unable to lift feet high enough to don high top sneakers using the shoe funnel. Pt surprised that his sneakers fit due to LE swelling. Gripper socks were then donned and pt completed oral care with setup assistance. CGA for short distance ambulatory transfer to the w/c using RW. Note that bed was elevated prior to sit<stand. He remained sitting up in the w/c with all needs within reach, anticipating next therapist.  Pt able to recall 3/3 hip precautions today. He also sat EOB for >10 minutes during self care activity/education.   Therapy Documentation Precautions:  Precautions Precautions: Fall,Posterior Hip,Other (comment) Precaution Comments: foley, wound vac L thigh, L JP drains L thigh Restrictions Weight Bearing Restrictions: No LLE Weight Bearing: Weight bearing as tolerated Other Position/Activity Restrictions: 3 JP drains in Lt anterior thigh and wound vac Vital Signs: Therapy Vitals Temp: 99.8 F  (37.7 C) Temp Source: Oral Pulse Rate: 91 Resp: 16 BP: 107/68 Patient Position (if appropriate): Lying Oxygen Therapy SpO2: 98 % O2 Device: Room Air ADL: ADL Where Assessed-Eating: Bed level Grooming: Setup,Minimal assistance Upper Body Bathing: Minimal assistance Lower Body Bathing: Dependent Upper Body Dressing: Minimal assistance Lower Body Dressing: Dependent Toileting: Dependent Toilet Transfer: Dependent (Stedy lift) Toilet Transfer Method: Other (comment) Antony Salmon)      Therapy/Group: Individual Therapy  [name] 10/17/2020, 4:18 PM

## 2020-10-17 NOTE — Consult Note (Signed)
WOC Nurse wound follow up Patient receiving care in [facility] 4W24. Wound type: left upper thigh surgical Measurement: 6.6 cm x 3.2 cm x 1.1 cm Wound bed: beefy red granulation tissue Drainage (amount, consistency, odor) serous Periwound: intact Dressing procedure/placement/frequency: Performed today and to be done daily by bedside nurses beginning 3/29, Apply a size appropriate piece of Xeroform into the left upper thigh wound, cover with dry gauze, tape in place. Monitor the wound area(s) for worsening of condition such as: Signs/symptoms of infection,  Increase in size,  Development of or worsening of odor, Development of pain, or increased pain at the affected locations.  Notify the medical team if any of these develop.  Thank you for the consult.  Discussed plan of care with the patient and bedside nurse.  WOC nurse will not follow at this time.  Please re-consult the WOC team if needed.  Helmut Muster, RN, MSN, CWOCN, CNS-BC, pager [phone]

## 2020-10-17 NOTE — Progress Notes (Signed)
PROGRESS NOTE   Subjective/Complaints: Appreciate wound care note  ROS: Denies CP, SOB, N/V/D  Objective:   No results found. No results for input(s): WBC, HGB, HCT, PLT in the last 72 hours. Recent Labs    10/17/20 0418  NA 132*  K 3.6  CL 101  CO2 27  GLUCOSE 93  BUN 5*  CREATININE 0.35*  CALCIUM 7.5*    Intake/Output Summary (Last 24 hours) at 10/17/2020 0902 Last data filed at 10/17/2020 0610 Gross per 24 hour  Intake 240 ml  Output 1100 ml  Net -860 ml        Physical Exam: Vital Signs Blood pressure 104/69, pulse 91, temperature 98.6 F (37 C), resp. rate 17, height 5\' 9"  (1.753 m), weight 84.8 kg, SpO2 95 %.  General: No acute distress Mood and affect are appropriate Heart: Regular rate and rhythm no rubs murmurs or extra sounds Lungs: Clear to auscultation, breathing unlabored, no rales or wheezes Abdomen: Positive bowel sounds, soft nontender to palpation, nondistended Extremities: No clubbing, cyanosis, or edema  GU: + Foley Skin: Warm and dry.  Left thigh with VAC Psych: Normal mood.  Normal behavior. Musc: Bilateral lower extremity edema Neuro: Alert Bilateral upper extremities: 5/5 proximal distal Right lower extremity: 4/5 proximal distally left lower extremity: Hip flexion, knee extension 2+/5 (pain inhibition)   Assessment/Plan: 1. Functional deficits which require 3+ hours per day of interdisciplinary therapy in a comprehensive inpatient rehab setting.  Physiatrist is providing close team supervision and 24 hour management of active medical problems listed below.  Physiatrist and rehab team continue to assess barriers to discharge/monitor patient progress toward functional and medical goals  Care Tool:  Bathing    Body parts bathed by patient: Right arm,Left arm,Chest,Abdomen,Right upper leg,Right lower leg,Left lower leg,Face,Buttocks   Body parts bathed by helper: Front  perineal area,Left upper leg Body parts n/a: Front perineal area,Buttocks,Left upper leg   Bathing assist Assist Level: Moderate Assistance - Patient 50 - 74%     Upper Body Dressing/Undressing Upper body dressing   What is the patient wearing?: Hospital gown only    Upper body assist Assist Level: Moderate Assistance - Patient 50 - 74%    Lower Body Dressing/Undressing Lower body dressing    Lower body dressing activity did not occur: Safety/medical concerns (Patient with multiple drains and wound vac) What is the patient wearing?: Hospital gown only     Lower body assist Assist for lower body dressing: Moderate Assistance - Patient 50 - 74%     Toileting Toileting    Toileting assist Assist for toileting: Dependent - Patient 0% (foley)     Transfers Chair/bed transfer  Transfers assist     Chair/bed transfer assist level: Moderate Assistance - Patient 50 - 74% Chair/bed transfer assistive device: assist      Assist level: Minimal Assistance - Patient > 75% Assistive device: Walker-rolling Max distance: 40'   Walk 10 feet activity   Assist  Walk 10 feet activity did not occur: Safety/medical concerns  Assist level: Minimal Assistance - Patient > 75% Assistive device: Walker-rolling   Walk 50 feet activity  Assist Walk 50 feet with 2 turns activity did not occur: Safety/medical concerns         Walk 150 feet activity   Assist Walk 150 feet activity did not occur: Safety/medical concerns         Walk 10 feet on uneven surface  activity   Assist Walk 10 feet on uneven surfaces activity did not occur: Safety/medical concerns         Wheelchair     Assist Will patient use wheelchair at discharge?: Yes Type of Wheelchair: Manual    Wheelchair assist level: Supervision/Verbal cueing Max wheelchair distance: 150    Wheelchair 50 feet with 2 turns activity    Assist         Assist Level: Supervision/Verbal cueing   Wheelchair 150 feet activity     Assist      Assist Level: Supervision/Verbal cueing   Blood pressure 104/69, pulse 91, temperature 98.6 F (37 C), resp. rate 17, height 5\' 9"  (1.753 m), weight 84.8 kg, SpO2 95 %.     Medical Problem List and Plan: 1.  Debility secondary to septic shock/left gluteal/groin abscesses/septic arthritis with history of avascular necrosis left hip  Continue CIR 2.  Impaired mobility: -DVT/anticoagulation: Continue SCDs.  Venous Doppler studies negative             -antiplatelet therapy: N/A 3. Pain Management: Continue MS Contin 15 mg every 12 hours, MSIR 15 mg every 6 hours as needed-using regularly.   Controlled with meds on 3/27 4. Depression: Continue Zoloft 50 mg daily.  Provide emotional support             -antipsychotic agents: N/A 5. Neuropsych: This patient is capable of making decisions on his own behalf. 6. Skin/Wound Care: Routine skin checks 7. Fluids/Electrolytes/Nutrition: Routine in and outs 8.  AVN left hip.  Status post left total hip arthroplasty with antibiotic spacer left hip excisional debridement of skin subcutaneous tissue muscle bone deep abscess 09/15/2020 per Dr. 09/17/2020.    Weightbearing as tolerated with posterior hip precautions 9.  Left groin and left hip abscess.  Status post multiple I&D's.  Wound VAC as directed-can likely be removed next week.- Gen Surg-Dr Cornett. May d/c sutures and drains.  10.  ID.  Continue ceftezole 2 g every 8 hours and Flagyl 500 mg every 8 hours and rifampin 300 mg every 12 hours through 10/28/2020 and stop.  Follow-up infectious disease  Persistent leukocytosis of 12.8 on 3/24 11.  Hepatic cirrhosis with ascites.  Status post multiple paracentesis.  GI recommends outpatient EGD.  Latest paracentesis 10/12/2020.  Continue Chronulac 10 mg twice daily.   Ultrasound on 10/15/2020 showing slight decrease in ascites, will order repeat paracentesis 12.   Scrotal edema.  Follow-up urology services Dr. 10/17/2020.  No concern for infection.  Continue Foley tube until edema improved and then plan voiding trial 13.  Anemia/thrombocytopenia.    Hemoglobin 8.5 on 3/24, continue to monitor 14.  Hypertension.  Lopressor 50 mg twice daily, Aldactone 100 mg daily, Lasix 40 mg daily  Controlled on 3/27 15.  Tobacco abuse.  Counseling 16. Hyponatremia:   Sodium 129 on 3/25, continue to monitor 17. Hypoalbuminemia: 1.6, continue high protein foods, recommended girlfriend bring in nuts, consult nephro regarding IV albumin given his anasarca- they will continue to monitor for need Still 1.4, + proteinuria  LOS: 7 days A FACE TO FACE EVALUATION WAS PERFORMED  4/25 10/17/2020, 9:02 AM

## 2020-10-18 ENCOUNTER — Inpatient Hospital Stay (HOSPITAL_COMMUNITY): Payer: 59

## 2020-10-18 LAB — URINALYSIS, ROUTINE W REFLEX MICROSCOPIC
Bilirubin Urine: NEGATIVE
Glucose, UA: NEGATIVE mg/dL
Hgb urine dipstick: NEGATIVE
Ketones, ur: NEGATIVE mg/dL
Leukocytes,Ua: NEGATIVE
Nitrite: NEGATIVE
Protein, ur: NEGATIVE mg/dL
Specific Gravity, Urine: 1.018 (ref 1.005–1.030)
pH: 5 (ref 5.0–8.0)

## 2020-10-18 NOTE — Progress Notes (Signed)
Occupational Therapy Session Note  Patient Details  Name: [name] MRN: [id number] Date of Birth: [birth year]  Today's Date: 10/18/2020 OT Individual Time: 0623-7628 OT Individual Time Calculation (min): 44 min    Short Term Goals: Week 1:  OT Short Term Goal 1 (Week 1): Patient will complete sit<>stand in Stedy with mod A OT Short Term Goal 2 (Week 1): Pt will tolerate sitting EOB for 10 minutes within BADL task OT Short Term Goal 3 (Week 1): Pt will use LH sponge to help was LEs   Skilled Therapeutic Interventions/Progress Updates:    Pt greeted at time of session supine in bed resting agreeable to OT session despite fatigue. Supine > sit Min/Mod for LLE management. Sit > stand Min from EOB and Min/CGA for stand pivot to wheelchair. Extended time throughout session for management of IV pole, Foley, and LLE. Declined ADL. Discussion throughout session for DC planning when home with girlfriend/home set up. Transported to gym and performed several rounds of dynamic standing with no support and at times unilateral support at BITS, standing approx 10 minutes total with no seated rest break. Transported back to room and transferred back to bed same manner, sit > supine Mod A. Alarm on call bell in reach.   Therapy Documentation Precautions:  Precautions Precautions: Fall,Posterior Hip,Other (comment) Precaution Comments: foley, wound vac L thigh, L JP drains L thigh Restrictions Weight Bearing Restrictions: No LLE Weight Bearing: Weight bearing as tolerated Other Position/Activity Restrictions: 3 JP drains in Lt anterior thigh and wound vac     Therapy/Group: Individual Therapy  Erasmo Score 10/18/2020, 7:28 AM

## 2020-10-18 NOTE — Progress Notes (Signed)
Physical Therapy Weekly Progress Note  Patient Details  Name: [name] MRN: [id number] Date of Birth: 09/16/1985  Beginning of progress report period: October 11, 2020 End of progress report period: October 18, 2020  Today's Date: 10/18/2020 PT Individual Time: 0900-1010; 1100-1155 PT Individual Time Calculation (min): 70 min and 55 mins  Patient has met 3 of 3 short term goals.  Patient is making significant progress toward his goals. He currently requires roughly MinA for functional mobility, though this can fluctuate based on height of surface he's standing up from and level of pain he's experiencing. He is able to ambulate up to 60f with RW and CGA. He continues to require increased assist when standing up from standard height chairs due to pain and general weakness, though this has significantly improved since eval.   Patient continues to demonstrate the following deficits muscle weakness, decreased cardiorespiratoy endurance and decreased standing balance and decreased balance strategies and therefore will continue to benefit from skilled PT intervention to increase functional independence with mobility.  Patient progressing toward long term goals..  Continue plan of care.  PT Short Term Goals Week 1:  PT Short Term Goal 1 (Week 1): Patient will complete bed mobility with ModA PT Short Term Goal 1 - Progress (Week 1): Met PT Short Term Goal 2 (Week 1): Patient will ambulate at least 119fwith LRAD and +2 assist as needed, MaxA PT Short Term Goal 2 - Progress (Week 1): Met PT Short Term Goal 3 (Week 1): Patient will transfer bed <> wc with LRAD and MaxA PT Short Term Goal 3 - Progress (Week 1): Met Week 2:  PT Short Term Goal 1 (Week 2): Patient will complete bed mobility with CGA consistently PT Short Term Goal 2 (Week 2): Patient will complete sit <>stand with LRAD and MinA PT Short Term Goal 3 (Week 2): Patient will tolerate flexing L knee >25* in supine  Skilled Therapeutic  Interventions/Progress Updates:    Session 1: Patient received supine in bed, agreeable to PT. He reports 5/10 pain in L hip, premedicated. PT providing rest breaks, distractions and repositioning to assist with pain management. He was able to come sit edge of bed with supervision and verbal cues ot "hook" RLE under LLE to assist it out of bed. Sit > stand from higher bed with MinA and CGA stand pivot to wc. Patient able to complete morning ADLs seated at sink with set up assist and propel himself in wc to therapy gym with supervision and assist for managing lines. Patient participating in standing endurance/ dynamic balance task with U UE support on RW and close supervision/CGA. Patient able to remain standing ~5-6 mins at a time before requiring seated rest break due to fatigue and pain. Patient ambulating ~4535fith RW and CGA with 2nd assist to manage lines. Patient returning to room in wc, call light within reach.   Session 2: Patient received sitting up in wc, agreeable to PT. He reports 6/10 pain, premedicated. PT providing rest breaks, distractions and repositioning to assist with pain management. PT transporting patient in wc to therapy gym for time management and energy conservation. He was able to ambulate up/down [id number] with RW and CGA. Patient reports having access to ramp that could go over the 2 steps he has to enter his house. PT reviewed how to negotiate 2 steps with U HR, however patient reported too much pain to feel comfortable attempting stairs. Patient will likely need a ramp to safely enter  his house, which he could access via walker or wc. Patient with significant increase in pain once he got to the therapy mat. ModA to return supine due to pain and to assist with pain management. Once pain had settled down, patient agreeable to minimal supine exercises to improve hip ROM. Heelslides in ~25% ROM completed 2x10 with MinA and isometric hip extension 2x10. Patient requesting to discontinue  exercises due to pain. ModA to return to sitting edge of bed and MinA to transfer to wc via stand pivot with RW. Patient returning to room in wc, transferring back to bed for comfort. ModA to return supine. Bed alarm on, call light within reach. RN aware that patients L thigh lateral dressing was coming off and heavily soaked through.   Therapy Documentation Precautions:  Precautions Precautions: Fall,Posterior Hip,Other (comment) Precaution Comments: foley, wound vac L thigh, L JP drains L thigh Restrictions Weight Bearing Restrictions: No LLE Weight Bearing: Weight bearing as tolerated Other Position/Activity Restrictions: 3 JP drains in Lt anterior thigh and wound vac   Therapy/Group: Individual Therapy  Debbora Dus 10/18/2020, 7:43 AM

## 2020-10-18 NOTE — Progress Notes (Signed)
PROGRESS NOTE   Subjective/Complaints: 2 L paracentesis, no abd pain  ROS: Denies CP, SOB, N/V/D  Objective:   IR Paracentesis  Result Date: 10/17/2020 INDICATION: Patient with history of septic arthritis, staph bacteremia, alcoholic cirrhosis, abdominal distension, and recurrent ascites. Request is made for therapeutic paracentesis. EXAM: ULTRASOUND GUIDED THERAPEUTIC PARACENTESIS MEDICATIONS: 10 mL 1% lidocaine COMPLICATIONS: None immediate. PROCEDURE: Informed written consent was obtained from the patient after a discussion of the risks, benefits and alternatives to treatment. A timeout was performed prior to the initiation of the procedure. Initial ultrasound scanning demonstrates a large amount of ascites within the right lower abdominal quadrant. The right lower abdomen was prepped and draped in the usual sterile fashion. 1% lidocaine was used for local anesthesia. Following this, a 19 gauge, 10-cm, Yueh catheter was introduced. An ultrasound image was saved for documentation purposes. The paracentesis was performed. The catheter was removed and a dressing was applied. The patient tolerated the procedure well without immediate post procedural complication. FINDINGS: A total of approximately 2 L of clear yellow fluid was removed. IMPRESSION: Successful ultrasound-guided paracentesis yielding 2 L of peritoneal fluid. Read by: Elwin Mocha, PA-C Electronically Signed   By: Acquanetta Belling M.D.   On: 10/17/2020 11:36   No results for input(s): WBC, HGB, HCT, PLT in the last 72 hours. Recent Labs    10/17/20 0418  NA 132*  K 3.6  CL 101  CO2 27  GLUCOSE 93  BUN 5*  CREATININE 0.35*  CALCIUM 7.5*    Intake/Output Summary (Last 24 hours) at 10/18/2020 0801 Last data filed at 10/18/2020 0517 Gross per 24 hour  Intake --  Output 2550 ml  Net -2550 ml        Physical Exam: Vital Signs Blood pressure 97/68, pulse 95, temperature  98.2 F (36.8 C), temperature source Oral, resp. rate 18, height 5\' 9"  (1.753 m), weight 83.6 kg, SpO2 97 %.  General: No acute distress Mood and affect are appropriate Heart: Regular rate and rhythm no rubs murmurs or extra sounds Lungs: Clear to auscultation, breathing unlabored, no rales or wheezes Abdomen: Positive bowel sounds, soft nontender to palpation, nondistended Extremities: No clubbing, cyanosis, or edema  GU: + Foley Skin: Warm and dry.  Left thigh with VAC Psych: Normal mood.  Normal behavior. Musc: Bilateral lower extremity edema Neuro: Alert Bilateral upper extremities: 5/5 proximal distal Right lower extremity: 4/5 proximal distally left lower extremity: Hip flexion, knee extension 2+/5 (pain inhibition)   Assessment/Plan: 1. Functional deficits which require 3+ hours per day of interdisciplinary therapy in a comprehensive inpatient rehab setting.  Physiatrist is providing close team supervision and 24 hour management of active medical problems listed below.  Physiatrist and rehab team continue to assess barriers to discharge/monitor patient progress toward functional and medical goals  Care Tool:  Bathing    Body parts bathed by patient: Right arm,Left arm,Chest,Abdomen,Right upper leg,Right lower leg,Left lower leg,Face,Buttocks   Body parts bathed by helper: Front perineal area,Left upper leg Body parts n/a: Front perineal area,Buttocks,Left upper leg   Bathing assist Assist Level: Moderate Assistance - Patient 50 - 74%     Upper Body Dressing/Undressing Upper body dressing  What is the patient wearing?: Hospital gown only    Upper body assist Assist Level: Moderate Assistance - Patient 50 - 74%    Lower Body Dressing/Undressing Lower body dressing    Lower body dressing activity did not occur: Safety/medical concerns (Patient with multiple drains and wound vac) What is the patient wearing?: Hospital gown only     Lower body assist Assist for  lower body dressing: Moderate Assistance - Patient 50 - 74%     Toileting Toileting    Toileting assist Assist for toileting: Dependent - Patient 0% (foley)     Transfers Chair/bed transfer  Transfers assist     Chair/bed transfer assist level: Contact Guard/Touching assist Chair/bed transfer assistive device: Walker,Armrests   Locomotion Ambulation   Ambulation assist      Assist level: Contact Guard/Touching assist Assistive device: Walker-rolling Max distance: 55   Walk 10 feet activity   Assist  Walk 10 feet activity did not occur: Safety/medical concerns  Assist level: Contact Guard/Touching assist Assistive device: Walker-rolling   Walk 50 feet activity   Assist Walk 50 feet with 2 turns activity did not occur: Safety/medical concerns  Assist level: Contact Guard/Touching assist      Walk 150 feet activity   Assist Walk 150 feet activity did not occur: Safety/medical concerns         Walk 10 feet on uneven surface  activity   Assist Walk 10 feet on uneven surfaces activity did not occur: Safety/medical concerns         Wheelchair     Assist Will patient use wheelchair at discharge?: Yes Type of Wheelchair: Manual    Wheelchair assist level: Supervision/Verbal cueing Max wheelchair distance: 200    Wheelchair 50 feet with 2 turns activity    Assist        Assist Level: Supervision/Verbal cueing   Wheelchair 150 feet activity     Assist      Assist Level: Supervision/Verbal cueing   Blood pressure 97/68, pulse 95, temperature 98.2 F (36.8 C), temperature source Oral, resp. rate 18, height 5\' 9"  (1.753 m), weight 83.6 kg, SpO2 97 %.     Medical Problem List and Plan: 1.  Debility secondary to septic shock/left gluteal/groin abscesses/septic arthritis with history of avascular necrosis left hip  Continue CIR, team conf in am  2.  Impaired mobility: -DVT/anticoagulation: Continue SCDs.  Venous Doppler  studies negative             -antiplatelet therapy: N/A 3. Pain Management: Continue MS Contin 15 mg every 12 hours, MSIR 15 mg every 6 hours as needed-using regularly.   Controlled with meds, only using MSIR 15mg  one tab every other day 4. Depression: Continue Zoloft 50 mg daily.  Provide emotional support             -antipsychotic agents: N/A 5. Neuropsych: This patient is capable of making decisions on his own behalf. 6. Skin/Wound Care: Routine skin checks 7. Fluids/Electrolytes/Nutrition: Routine in and outs 8.  AVN left hip.  Status post left total hip arthroplasty with antibiotic spacer left hip excisional debridement of skin subcutaneous tissue muscle bone deep abscess 09/15/2020 per Dr. .    Weightbearing as tolerated with posterior hip precautions 9.  Left groin and left hip abscess.  Status post multiple I&D's.  Wound VAC as directed-can likely be removed next week.- Gen Surg-Dr Cornett. May d/c sutures and drains.  10.  ID.  Continue ceftezole 2 g every 8 hours and Flagyl 500  mg every 8 hours and rifampin 300 mg every 12 hours through 10/28/2020 and stop.  Follow-up infectious disease  Persistent leukocytosis of 12.8 on 3/24 11.  Hepatic cirrhosis with ascites.  Status post multiple paracentesis.  GI recommends outpatient EGD.  Latest paracentesis 10/12/2020.  Continue Chronulac 10 mg twice daily.   Ultrasound on 10/15/2020 showing slight decrease in ascites, will order repeat paracentesis 12.  Scrotal edema.  Follow-up urology services Dr. Laverle Patter.  No concern for infection.  Continue Foley tube until edema improved and then plan voiding trial 13.  Anemia/thrombocytopenia.    Hemoglobin 8.5 on 3/24, continue to monitor 14.  Hypertension.  Lopressor 50 mg twice daily, Aldactone 100 mg daily, Lasix 40 mg daily  Controlled on 3/27 15.  Tobacco abuse.  Counseling 16. Hyponatremia:   Sodium 129 on 3/25, continue to monitor 17. Hypoalbuminemia: 1.6, continue high protein foods,  recommended girlfriend bring in nuts, Still 1.4, + proteinuria  LOS: 8 days A FACE TO FACE EVALUATION WAS PERFORMED  Erick Colace 10/18/2020, 8:01 AM

## 2020-10-19 ENCOUNTER — Inpatient Hospital Stay (HOSPITAL_COMMUNITY): Payer: 59

## 2020-10-19 LAB — CBC
HCT: 31 % — ABNORMAL LOW (ref 39.0–52.0)
Hemoglobin: 9.8 g/dL — ABNORMAL LOW (ref 13.0–17.0)
MCH: 32.2 pg (ref 26.0–34.0)
MCHC: 31.6 g/dL (ref 30.0–36.0)
MCV: 102 fL — ABNORMAL HIGH (ref 80.0–100.0)
Platelets: 350 10*3/uL (ref 150–400)
RBC: 3.04 MIL/uL — ABNORMAL LOW (ref 4.22–5.81)
RDW: 15.9 % — ABNORMAL HIGH (ref 11.5–15.5)
WBC: 18 10*3/uL — ABNORMAL HIGH (ref 4.0–10.5)
nRBC: 0 % (ref 0.0–0.2)

## 2020-10-19 MED ORDER — SODIUM CHLORIDE 0.9 % IV SOLN
INTRAVENOUS | Status: DC | PRN
  Administered 2020-10-19 – 2020-10-21 (×3): 250 mL via INTRAVENOUS

## 2020-10-19 MED ORDER — IOHEXOL 300 MG/ML  SOLN
100.0000 mL | Freq: Once | INTRAMUSCULAR | Status: AC | PRN
  Administered 2020-10-19: 100 mL via INTRAVENOUS

## 2020-10-19 MED ORDER — OXYCODONE HCL 5 MG PO TABS
5.0000 mg | ORAL_TABLET | Freq: Four times a day (QID) | ORAL | Status: DC | PRN
  Administered 2020-10-19 – 2020-10-28 (×19): 5 mg via ORAL
  Filled 2020-10-19 (×20): qty 1

## 2020-10-19 NOTE — Progress Notes (Signed)
Patient ID: [name], male   DOB: 1986-05-25, 35 y.o.   MRN: [id number]   SW contacted [facility] to follow up with pt and family.   [place], [place] [phone]

## 2020-10-19 NOTE — Plan of Care (Signed)
  Problem: Consults Goal: RH GENERAL PATIENT EDUCATION Description: See Patient Education module for education specifics. Outcome: Progressing Goal: Skin Care Protocol Initiated - if Braden Score 18 or less Description: If consults are not indicated, leave blank or document N/A Outcome: Progressing Goal: Nutrition Consult-if indicated Outcome: Progressing   Problem: RH BOWEL ELIMINATION Goal: RH STG MANAGE BOWEL WITH ASSISTANCE Description: STG Manage Bowel with mod I Assistance. Outcome: Progressing   Problem: RH BLADDER ELIMINATION Goal: RH STG MANAGE BLADDER WITH ASSISTANCE Description: STG Manage Bladder With mod I Assistance Outcome: Progressing Goal: RH STG MANAGE BLADDER WITH EQUIPMENT WITH ASSISTANCE Description: STG Manage Bladder With Equipment With  min Assistance Outcome: Progressing   Problem: RH SKIN INTEGRITY Goal: RH STG SKIN FREE OF INFECTION/BREAKDOWN Description: Skin will be free of infection/breakdown with min assist Outcome: Progressing Goal: RH STG MAINTAIN SKIN INTEGRITY WITH ASSISTANCE Description: STG Maintain Skin Integrity With min Assistance. Outcome: Progressing Goal: RH STG ABLE TO PERFORM INCISION/WOUND CARE W/ASSISTANCE Description: STG Able To Perform Incision/Wound Care With min Assistance. Outcome: Progressing   Problem: RH SAFETY Goal: RH STG ADHERE TO SAFETY PRECAUTIONS W/ASSISTANCE/DEVICE Description: STG Adhere to Safety Precautions With cues/reminders Assistance/Device. Outcome: Progressing   Problem: RH PAIN MANAGEMENT Goal: RH STG PAIN MANAGED AT OR BELOW PT'S PAIN GOAL Description: Pain will be managed at of below 3 out of 10 on pain scale with min assist Outcome: Progressing   Problem: RH KNOWLEDGE DEFICIT GENERAL Goal: RH STG INCREASE KNOWLEDGE OF SELF CARE AFTER HOSPITALIZATION Description: Patient will be able to manage self care at discharge using handouts and educational materials with cues/reminders Outcome:  Progressing   

## 2020-10-19 NOTE — Patient Care Conference (Signed)
Inpatient RehabilitationTeam Conference and Plan of Care Update Date: 10/19/2020   Time: 10:27 AM    Patient Name: [name]      Medical Record Number: [id number]  Date of Birth: February 16, 1986 Sex: Male         Room/Bed: 4W24C/4W24C-01 Payor Info: Payor: [employer]  / Plan: [employer] / Product Type: *No Product type* /    Admit Date/Time:  10/10/2020  2:34 PM  Primary Diagnosis:  Debility  Hospital Problems: Principal Problem:   Debility Active Problems:   Septic shock (HCC)   Ascites   Hyponatremia   Essential hypertension   AVN (avascular necrosis of bone) (HCC)   Abscess of hip, left    Expected Discharge Date: Expected Discharge Date: 10/28/20 (afternoon discharge p ABX/CT scan pending)  Team Members Present: Physician leading conference: Dr. Claudette Laws Care Coodinator Present: Chana Bode, RN, BSN, CRRN;Christina Hatillo, BSW Nurse Present: Chana Bode, RN PT Present: Merry Lofty, PT PPS Coordinator present : Edson Snowball, Park Breed, SLP     Current Status/Progress Goal Weekly Team Focus  Bowel/Bladder   Pt has indwelling catheter. LBM 3/29 continent  remain continent upon MD order remove foley and do trial voids  Appropriate Foley care and do voiding trial when appropriate   Swallow/Nutrition/ Hydration             ADL's   Sit > stands Mod, gowns only w/ Min/Mod A doesnt want to wear pants but likely Max, Max/total with toileting, stand pivots Min A w/ RW  MinA/supervision  ADl transfers, sit <> stands, general conditioning, transitional movement, AE with self care   Mobility   MinA bed mobility, up to ModA STS with RW, MinA/CGA stand pivot with RW, supervision wc mobility x296ft, up to 14ft gait with RW and CGA, CGA gait over ramp, unable to do stairs yet  supervision- MinA  pain managements, transfers, bed mobility, patient/family education, endurance   Communication             Safety/Cognition/ Behavioral Observations             Pain   scheduled MS contin and PRN MSIR for hip/thigh pain- about 5-7 out of 10  <4/10  assess q shift and PRN   Skin   Lt hip surgical incision-pin point drainage-ABD pad daily, Lt groin wet to dry dressing BID-red granulation tissue, Suture to Lt thigh incision and xereform to pink/red opened area, foam out lt thight over drain site? drainage. MASD noted to buttocks, right groin and under abd fold.  skin free of infection no breakdown  Continue to assess q shift and PRN     Discharge Planning:  Pt discharging home with girlfriend. Intermittent A (works during day), 1 level home 2 steps to enter   Team Discussion: Follow up CT of hip and femur due to drainage. IV abx. continued through 10/28/20. Paracentensis prn with decreased output noted. Continue protein supplement. Continue foley and continent of bowel.  Patient on target to meet rehab goals: yes,  Currently supervision - min assist for self care and demo good technique to help affected leg. Max assist for clothes management and min assist for stand pivot. Mod assist for transfers and supervision for mobility and CGA for ramp management. Supervision goals set for discharge.  *See Care Plan and progress notes for long and short-term goals.   Revisions to Treatment Plan:    Teaching Needs: Transfers, toileting, skin care, medications, etc.   Current Barriers to Discharge:  Decreased caregiver support, Home enviroment access/layout and Weight bearing restrictions  Possible Resolutions to Barriers: Ramp installation at home over 2 step entry Family education     Medical Summary Current Status: switched abx to Ancef, will stop KVO, pain controlled, 2 L paracentesis  Barriers to Discharge: Medical stability;Wound care;IV antibiotics   Possible Resolutions to Barriers/Weekly Focus: Repeating CT hip an dfemur, cont abd [place] weekly   Continued Need for Acute Rehabilitation Level of Care: The patient requires daily medical  management by a physician with specialized training in physical medicine and rehabilitation for the following reasons: Direction of a multidisciplinary physical rehabilitation program to maximize functional independence : Yes Medical management of patient stability for increased activity during participation in an intensive rehabilitation regime.: Yes Analysis of laboratory values and/or radiology reports with any subsequent need for medication adjustment and/or medical intervention. : Yes   I attest that I was present, lead the team conference, and concur with the assessment and plan of the team.   Chana Bode B 10/19/2020, 2:39 PM

## 2020-10-19 NOTE — Progress Notes (Signed)
PROGRESS NOTE   Subjective/Complaints: Appreciate ortho note, Xray hip ok CT pending  ROS: Denies CP, SOB, N/V/D  Objective:   DG HIP UNILAT WITH PELVIS 2-3 VIEWS LEFT  Result Date: 10/19/2020 CLINICAL DATA:  Hip replacement. EXAM: DG HIP (WITH OR WITHOUT PELVIS) 2-3V LEFT COMPARISON:  10/04/2020.  CT 09/30/2020. FINDINGS: Interim removal of drainage catheter about the left hip. Soft tissue wound over the medial left thigh again noted. Left hip replacement again noted. No acute bony abnormality. No evidence of fracture or dislocation. IMPRESSION: 1. Interval removal of drainage catheter about the left hip. Persistent wound noted over the medial left thigh. 2.  Left hip replacement again noted.  No acute bony abnormality. Electronically Signed   By: Marcello Moores  Register   On: 10/19/2020 07:21   IR Paracentesis  Result Date: 10/17/2020 INDICATION: Patient with history of septic arthritis, staph bacteremia, alcoholic cirrhosis, abdominal distension, and recurrent ascites. Request is made for therapeutic paracentesis. EXAM: ULTRASOUND GUIDED THERAPEUTIC PARACENTESIS MEDICATIONS: 10 mL 1% lidocaine COMPLICATIONS: None immediate. PROCEDURE: Informed written consent was obtained from the patient after a discussion of the risks, benefits and alternatives to treatment. A timeout was performed prior to the initiation of the procedure. Initial ultrasound scanning demonstrates a large amount of ascites within the right lower abdominal quadrant. The right lower abdomen was prepped and draped in the usual sterile fashion. 1% lidocaine was used for local anesthesia. Following this, a 19 gauge, 10-cm, Yueh catheter was introduced. An ultrasound image was saved for documentation purposes. The paracentesis was performed. The catheter was removed and a dressing was applied. The patient tolerated the procedure well without immediate post procedural complication.  FINDINGS: A total of approximately 2 L of clear yellow fluid was removed. IMPRESSION: Successful ultrasound-guided paracentesis yielding 2 L of peritoneal fluid. Read by: Earley Abide, PA-C Electronically Signed   By: Miachel Roux M.D.   On: 10/17/2020 11:36   No results for input(s): WBC, HGB, HCT, PLT in the last 72 hours. Recent Labs    10/17/20 0418  NA 132*  K 3.6  CL 101  CO2 27  GLUCOSE 93  BUN 5*  CREATININE 0.35*  CALCIUM 7.5*    Intake/Output Summary (Last 24 hours) at 10/19/2020 7262 Last data filed at 10/19/2020 0500 Gross per 24 hour  Intake 300 ml  Output 450 ml  Net -150 ml        Physical Exam: Vital Signs Blood pressure 104/66, pulse 97, temperature 98.8 F (37.1 C), resp. rate 16, height 5' 9"  (1.753 m), weight 82.9 kg, SpO2 95 %.  General: No acute distress Mood and affect are appropriate Heart: Regular rate and rhythm no rubs murmurs or extra sounds Lungs: Clear to auscultation, breathing unlabored, no rales or wheezes Abdomen: Positive bowel sounds, soft nontender to palpation, nondistended Extremities: No clubbing, cyanosis, or edema  GU: + Foley Skin: Warm and dry.  Left thigh with VAC Psych: Normal mood.  Normal behavior. Musc: Bilateral lower extremity edema Neuro: Alert Bilateral upper extremities: 5/5 proximal distal Right lower extremity: 4/5 proximal distally left lower extremity: Hip flexion, knee extension 2+/5 (pain inhibition)   Assessment/Plan: 1. Functional deficits which require 3+  hours per day of interdisciplinary therapy in a comprehensive inpatient rehab setting.  Physiatrist is providing close team supervision and 24 hour management of active medical problems listed below.  Physiatrist and rehab team continue to assess barriers to discharge/monitor patient progress toward functional and medical goals  Care Tool:  Bathing    Body parts bathed by patient: Right arm,Left arm,Chest,Abdomen,Right upper leg,Right lower  leg,Left lower leg,Face,Buttocks   Body parts bathed by helper: Front perineal area,Left upper leg Body parts n/a: Front perineal area,Buttocks,Left upper leg   Bathing assist Assist Level: Moderate Assistance - Patient 50 - 74%     Upper Body Dressing/Undressing Upper body dressing   What is the patient wearing?: Hospital gown only    Upper body assist Assist Level: Moderate Assistance - Patient 50 - 74%    Lower Body Dressing/Undressing Lower body dressing    Lower body dressing activity did not occur: Safety/medical concerns (Patient with multiple drains and wound vac) What is the patient wearing?: Hospital gown only     Lower body assist Assist for lower body dressing: Moderate Assistance - Patient 50 - 74%     Toileting Toileting    Toileting assist Assist for toileting: Dependent - Patient 0% (foley)     Transfers Chair/bed transfer  Transfers assist     Chair/bed transfer assist level: Minimal Assistance - Patient > 75% Chair/bed transfer assistive device: Walker,Armrests   Locomotion Ambulation   Ambulation assist      Assist level: Contact Guard/Touching assist Assistive device: Walker-rolling Max distance: 45   Walk 10 feet activity   Assist  Walk 10 feet activity did not occur: Safety/medical concerns  Assist level: Contact Guard/Touching assist Assistive device: Walker-rolling   Walk 50 feet activity   Assist Walk 50 feet with 2 turns activity did not occur: Safety/medical concerns  Assist level: Contact Guard/Touching assist      Walk 150 feet activity   Assist Walk 150 feet activity did not occur: Safety/medical concerns         Walk 10 feet on uneven surface  activity   Assist Walk 10 feet on uneven surfaces activity did not occur: Safety/medical concerns   Assist level: Contact Guard/Touching assist Assistive device: Aeronautical engineer Will patient use wheelchair at discharge?: Yes Type  of Wheelchair: Manual    Wheelchair assist level: Supervision/Verbal cueing Max wheelchair distance: 150    Wheelchair 50 feet with 2 turns activity    Assist        Assist Level: Supervision/Verbal cueing   Wheelchair 150 feet activity     Assist      Assist Level: Supervision/Verbal cueing   Blood pressure 104/66, pulse 97, temperature 98.8 F (37.1 C), resp. rate 16, height 5' 9"  (1.753 m), weight 82.9 kg, SpO2 95 %.     Medical Problem List and Plan: 1.  Debility secondary to septic shock/left gluteal/groin abscesses/septic arthritis with history of avascular necrosis left hip  Continue CIR, Team conference today please see physician documentation under team conference tab, met with team  to discuss problems,progress, and goals. Formulized individual treatment plan based on medical history, underlying problem and comorbidities.  2.  Impaired mobility: -DVT/anticoagulation: Continue SCDs.  Venous Doppler studies negative             -antiplatelet therapy: N/A 3. Pain Management: Continue MS Contin 15 mg every 12 hours, MSIR 15 mg every 6 hours as needed-using regularly.   Controlled with meds, only  using MSIR 32m one tab every other day 4. Depression: Continue Zoloft 50 mg daily.  Provide emotional support             -antipsychotic agents: N/A 5. Neuropsych: This patient is capable of making decisions on his own behalf. 6. Skin/Wound Care: Routine skin checks 7. Fluids/Electrolytes/Nutrition: Routine in and outs 8.  AVN left hip.  Status post left total hip arthroplasty with antibiotic spacer left hip excisional debridement of skin subcutaneous tissue muscle bone deep abscess 09/15/2020 per Dr. LMardelle Matte    Weightbearing as tolerated with posterior hip precautions 9.  Left groin and left hip abscess.  Status post multiple I&D's.  Wound VAC as directed-can likely be removed next week.- Gen Surg-Dr Cornett. May d/c sutures and drains.  10.  ID.  Continue ceftezole 2  g every 8 hours and Flagyl 500 mg every 8 hours and rifampin 300 mg every 12 hours through 10/28/2020 and stop.  Follow-up infectious disease  Persistent leukocytosis of 12.8 on 3/24 11.  Hepatic cirrhosis with ascites.  Status post multiple paracentesis.  GI recommends outpatient EGD.  Latest paracentesis 10/12/2020.  Continue Chronulac 10 mg twice daily.   Ultrasound on 10/15/2020 showing slight decrease in ascites, will order repeat paracentesis 12.  Scrotal edema.  Follow-up urology services Dr. BAlinda Money  No concern for infection.  Continue Foley tube until edema improved and then plan voiding trial 13.  Anemia/thrombocytopenia.    Hemoglobin 8.5 on 3/24, continue to monitor 14.  Hypertension.  Lopressor 50 mg twice daily, Aldactone 100 mg daily, Lasix 40 mg daily  Controlled on 3/27 15.  Tobacco abuse.  Counseling 16. Hyponatremia:   Sodium 129 on 3/25, 132 on 3/28, monitor trend, asymptomatic likely diuretic related  17. Hypoalbuminemia: 1.6, continue high protein foods, recommended girlfriend bring in nuts, Still 1.4, + proteinuria  LOS: 9 days A FACE TO FACE EVALUATION WAS PERFORMED  ACharlett Blake3/30/2022, 9:38 AM

## 2020-10-19 NOTE — Progress Notes (Signed)
Occupational Therapy Session Note  Patient Details  Name: [name] MRN: [id number] Date of Birth: 04/19/1986  Today's Date: 10/19/2020 OT Individual Time: 1300-1400 OT Individual Time Calculation (min): 60 min    Short Term Goals: Week 1:  OT Short Term Goal 1 (Week 1): Patient will complete sit<>stand in [place] with mod A OT Short Term Goal 2 (Week 1): Pt will tolerate sitting EOB for 10 minutes within BADL task OT Short Term Goal 3 (Week 1): Pt will use LH sponge to help was LEs  Skilled Therapeutic Interventions/Progress Updates:    Pt supine with 4/10 pain in his L hip/femur, no request for intervention. Pt's foley was emptied of 750 mL.  Pt completed bed mobility with good adherence of posterior hip precautions with supervision overall. Pt completed ambulatory transfer to the w/c with min A overall. Heavy reliance on RW. Pt was taken to the therapy gym via w/c. Pt completed stand pivot to the mat from w/c- with foam mat placed for comfort under his sacrum. Pt completed standing level unilateral and then bilateral UE strengthening exercises to increase weightbearing through BLE evenly and UE strengthening. Pt reporting baseline R shoulder impingement. Scapula winging present as well as reduced scapular mobility on the R. Scapular glides and mobilization completed to address impingement. B rows completed for scapular musculature strengthening. Pain increasing to 6/10 in his L hip. Pt returned to his room and transferred back to the recliner. Pt left sitting up with all needs met. Lab staff present.   Therapy Documentation Precautions:  Precautions Precautions: Fall,Posterior Hip,Other (comment) Precaution Comments: foley, wound vac L thigh, L JP drains L thigh Restrictions Weight Bearing Restrictions: No LLE Weight Bearing: Weight bearing as tolerated Other Position/Activity Restrictions: 3 JP drains in Lt anterior thigh and wound vac   Therapy/Group: Individual  Therapy  Sandra H Davis 10/19/2020, 6:24 AM  

## 2020-10-19 NOTE — Progress Notes (Signed)
Physical Therapy Session Note  Patient Details  Name: [name] MRN: [id number] Date of Birth: 11-23-1985  Today's Date: 10/19/2020 PT Individual Time: 0383-3383 PT Individual Time Calculation (min): 40 min   Short Term Goals: Week 2:  PT Short Term Goal 1 (Week 2): Patient will complete bed mobility with CGA consistently PT Short Term Goal 2 (Week 2): Patient will complete sit <>stand with LRAD and MinA PT Short Term Goal 3 (Week 2): Patient will tolerate flexing L knee >25* in supine  Skilled Therapeutic Interventions/Progress Updates:   Pt received sitting in recliner and agreeable to PT. Pt performed stand pivot transfer to [name] with RW and CGA for safety. Min assist for all additional sit<>stand transfers throughout session. Pt transported to day room. Gait training with RW x 29f with CGA-supervision assist. Cues for safety in turns from PT.   Pt reports increasing pain in the LLE from 5/10 to 7/10 following gait training. And requesting to return to bed upon completion of PT treatment.   Seated BLE therex: SLR within available range, hip abduction/Adductoin AROM, reciprocal march , ankle DF/PF. Each performed x 10 BLE with cues for full ROM as pain allowed with noted decrease in knee extension and hip adduction on the LLE.   WC mobility through hall x 1544fto room with supervision assist for safety with min cues for obstacle navigation and doorway management.   Pt returned to room and performed stand pivot transfer to bed with min assist and LR for safety due to LLE pain. Sit>supine completed with min assist for LLE management  and left supine in bed with call bell in reach and all needs met.         Therapy Documentation Precautions:  Precautions Precautions: Fall,Posterior Hip,Other (comment) Precaution Comments: foley, wound vac L thigh, L JP drains L thigh Restrictions Weight Bearing Restrictions: No LLE Weight Bearing: Weight bearing as tolerated Other  Position/Activity Restrictions: 3 JP drains in Lt anterior thigh and wound vac    Vital Signs: Therapy Vitals Temp: 99.5 F (37.5 C) Temp Source: Oral Pulse Rate: 96 Resp: 15 BP: 101/73 Patient Position (if appropriate): Lying Oxygen Therapy SpO2: 96 % O2 Device: Room Air    Therapy/Group: Individual Therapy  AuLorie Phenix/30/2022, 3:54 PM

## 2020-10-19 NOTE — Progress Notes (Signed)
Physical Therapy Session Note  Patient Details  Name: [name] MRN: [id number] Date of Birth: 05-Jul-1986  Today's Date: 10/19/2020 PT Individual Time: 0900-1000 PT Individual Time Calculation (min): 60 min   Short Term Goals: Week 2:  PT Short Term Goal 1 (Week 2): Patient will complete bed mobility with CGA consistently PT Short Term Goal 2 (Week 2): Patient will complete sit <>stand with LRAD and MinA PT Short Term Goal 3 (Week 2): Patient will tolerate flexing L knee >25* in supine  Skilled Therapeutic Interventions/Progress Updates:    Patient received supine in bed, agreeable to PT. He reports 5/10 pain in L hip, premedicated. PT providing rest breaks, distractions and repositioning to assist with pain management. He was able to come sit edge of bed with supervision hooking R LE under L LE. Patient standing from raised bed, which he reports is similar in height to his bed at home, with CGA and RW. He ambulated to bathroom with RW and CGA + assist for managing lines. Patient continent of bowel and TotalA for perihygiene in standing. He does require up to MinA to stand from raised toilet. Morning ADLs completed seated at sink for energy conservation. MD present for assessment. PT transporting patient to therapy gym for time management and energy conservation. He was able to ambulate 60ft with RW and light CGA with MinA for initial stand. Patient reporting distance to be similar to household distances in his home. Improving gait speed and weight shift through L LE. He continues to rely heavily on B UE when shifting onto L LE, but reports no increase in pain. Patient returning to room, requesting to transfer back to bed. MinA to return to supine. Bed alarm on, call light within reach.  Therapy Documentation Precautions:  Precautions Precautions: Fall,Posterior Hip,Other (comment) Precaution Comments: foley, wound vac L thigh, L JP drains L thigh Restrictions Weight Bearing  Restrictions: No LLE Weight Bearing: Weight bearing as tolerated Other Position/Activity Restrictions: 3 JP drains in Lt anterior thigh and wound vac    Therapy/Group: Individual Therapy  Elizebeth Koller, PT, DPT, CBIS  10/19/2020, 7:45 AM

## 2020-10-19 NOTE — Progress Notes (Addendum)
Subjective:    Patient's pain improving and happy he is making steady progress with PT. States he continues to have pain at left hip and groin and feels like feet are more swollen than when he first arrived at [facility].   Objective:  PE: VITALS:   Vitals:   10/18/20 0502 10/18/20 1451 10/18/20 1938 10/19/20 0458  BP: 97/68 104/70 104/68 104/66  Pulse: 95 92 (!) 101 97  Resp: 18 14 18 16   Temp: 98.2 F (36.8 C) 99.6 F (37.6 C) 99.6 F (37.6 C) 98.8 F (37.1 C)  TempSrc: Oral Oral Oral   SpO2: 97% 96% 97% 95%  Weight: 83.6 kg   82.9 kg  Height:       General: laying in bed in no acute distress MSK: Left hip incision as pictures below. Left anterior thigh incision improved, with no significant erythema or drainage, sutures intact. No TTP to left knee. Dorsiflexion and plantarflexion intact. 2+ edema at bilateral feet. Feet warm and well perfused. Distal sensation intact.       LABS  Results for orders placed or performed during the hospital encounter of 10/10/20 (from the past 24 hour(s))  Urinalysis, Routine w reflex microscopic Urine, Clean Catch     Status: Abnormal   Collection Time: 10/18/20  6:00 PM  Result Value Ref Range   Color, Urine AMBER (A) YELLOW   APPearance CLEAR CLEAR   Specific Gravity, Urine 1.018 1.005 - 1.030   pH 5.0 5.0 - 8.0   Glucose, UA NEGATIVE NEGATIVE mg/dL   Hgb urine dipstick NEGATIVE NEGATIVE   Bilirubin Urine NEGATIVE NEGATIVE   Ketones, ur NEGATIVE NEGATIVE mg/dL   Protein, ur NEGATIVE NEGATIVE mg/dL   Nitrite NEGATIVE NEGATIVE   Leukocytes,Ua NEGATIVE NEGATIVE    DG HIP UNILAT WITH PELVIS 2-3 VIEWS LEFT  Result Date: 10/19/2020 CLINICAL DATA:  Hip replacement. EXAM: DG HIP (WITH OR WITHOUT PELVIS) 2-3V LEFT COMPARISON:  10/04/2020.  CT 09/30/2020. FINDINGS: Interim removal of drainage catheter about the left hip. Soft tissue wound over the medial left thigh again noted. Left hip replacement again noted. No acute bony abnormality.  No evidence of fracture or dislocation. IMPRESSION: 1. Interval removal of drainage catheter about the left hip. Persistent wound noted over the medial left thigh. 2.  Left hip replacement again noted.  No acute bony abnormality. Electronically Signed   By: 11/30/2020  Register   On: 10/19/2020 07:21   IR Paracentesis  Result Date: 10/17/2020 INDICATION: Patient with history of septic arthritis, staph bacteremia, alcoholic cirrhosis, abdominal distension, and recurrent ascites. Request is made for therapeutic paracentesis. EXAM: ULTRASOUND GUIDED THERAPEUTIC PARACENTESIS MEDICATIONS: 10 mL 1% lidocaine COMPLICATIONS: None immediate. PROCEDURE: Informed written consent was obtained from the patient after a discussion of the risks, benefits and alternatives to treatment. A timeout was performed prior to the initiation of the procedure. Initial ultrasound scanning demonstrates a large amount of ascites within the right lower abdominal quadrant. The right lower abdomen was prepped and draped in the usual sterile fashion. 1% lidocaine was used for local anesthesia. Following this, a 19 gauge, 10-cm, Yueh catheter was introduced. An ultrasound image was saved for documentation purposes. The paracentesis was performed. The catheter was removed and a dressing was applied. The patient tolerated the procedure well without immediate post procedural complication. FINDINGS: A total of approximately 2 L of clear yellow fluid was removed. IMPRESSION: Successful ultrasound-guided paracentesis yielding 2 L of peritoneal fluid. Read by: 10/19/2020, PA-C Electronically Signed  By: Mauri Reading  Mir M.D.   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 - 3/1 - left thigh irrigation and  debridement with wound vac placementby orthopedics, removal of left retroperitoneal drain - 3/3 - I&D of left groin abscess with change of wound vac by general surgery 3/4 - CT guided drainage of left thigh fluid collection by IR - 3/8 - left thigh repeat irrigation and debridement by orthopedics with partial closure of wound, incision and drainage of left thigh abscess/left groin abscessby general surgery 3/11 - Removal ofleftpsoas drain, CT guided aspiration ofleftiliacus abscess, CT guided drain placement of left anterior thigh abscess by IR - overall making good progress with PT as well as wound healing at left groin and anterior thigh - with patient's continued drainage from left hip incision at this point after surgery will order repeat CT scan today to look at left hip and femur, x-rays negative for any new changes since original left hip arthroplasty with cement interposition spacer - recommend continuing with BID dressing changes at left hip, I will plan to remove sutures from left anterior thigh later today  Contact information:   Weekdays 8-5 Janine Ores, PA-C [phone] A fter hours and holidays please check [email or web address] for group call information for [facility]  Armida Sans 10/19/2020, 8:46 AM

## 2020-10-19 NOTE — Progress Notes (Signed)
Patient ID: [name], male   DOB: 1986-03-15, 35 y.o.   MRN: [id number] Team Conference Report to Patient/Family  Team Conference discussion was reviewed with the patient and caregiver, including goals, any changes in plan of care and target discharge date.  Patient and caregiver express understanding and are in agreement.  The patient has a target discharge date of 10/28/20 (afternoon discharge p ABX/CT scan pending). SW spoke to pt and girlfriend (via telephone) patient encouraged to intake proteins. SW will follow up with [employer] on pt disability information. Pt d/c moved to 4/8 (IV ABX complete this day). Pt girlfriend daughter is [employer] fundraiser and will be assisting pt in home. Sw will cont to follow up with questions and concerns.   Andria Rhein 10/19/2020, 2:03 PM

## 2020-10-20 ENCOUNTER — Inpatient Hospital Stay (HOSPITAL_COMMUNITY): Payer: 59

## 2020-10-20 NOTE — Progress Notes (Signed)
Physical Therapy Session Note  Patient Details  Name: [name] MRN: [id number] Date of Birth: 1986-04-15  Today's Date: 10/20/2020 PT Individual Time: 0900-0955 PT Individual Time Calculation (min): 55 min   Short Term Goals: Week 2:  PT Short Term Goal 1 (Week 2): Patient will complete bed mobility with CGA consistently PT Short Term Goal 2 (Week 2): Patient will complete sit <>stand with LRAD and MinA PT Short Term Goal 3 (Week 2): Patient will tolerate flexing L knee >25* in supine  Skilled Therapeutic Interventions/Progress Updates:    Patient received supine in bed, agreeable to PT. He reports 5/10 pain in L hip/thigh, premedicated. PT providing rest breaks, distractions and repositioning to assist with pain management. He also reports and appears to be more "down" this AM regarding CT results. He was able to come sit edge of bed with supervision and transfer to wc via stand pivot with RW and CGA. Elevated bed height, but he reports that it is similar to his bed height at home. PT transporting patient outside in wc to assist with mood/affect. Once back inside, patient completing AROM to L knee seated with improved AROM noted. Sit <> stand x4 with RW and MinA from wc height. Patient returning to room in wc, seatbelt alarm on, call light within reach.     Therapy Documentation Precautions:  Precautions Precautions: Fall,Posterior Hip,Other (comment) Precaution Comments: foley, wound vac L thigh, L JP drains L thigh Restrictions Weight Bearing Restrictions: No LLE Weight Bearing: Weight bearing as tolerated Other Position/Activity Restrictions: 3 JP drains in Lt anterior thigh and wound vac    Therapy/Group: Individual Therapy  Elizebeth Koller, PT, DPT, CBIS  10/20/2020, 7:40 AM

## 2020-10-20 NOTE — Progress Notes (Signed)
PROGRESS NOTE   Subjective/Complaints: Reviewed CT result, pelvic CT improving but has long thin fluid collection along vastus intermedius and semi membranosus, no increased pain Also no scrotal pain, still has foley ROS: Denies CP, SOB, N/V/D  Objective:   CT ABDOMEN PELVIS W CONTRAST  Result Date: 10/19/2020 CLINICAL DATA:  Wound dehiscence and drainage. EXAM: CT ABDOMEN AND PELVIS WITH CONTRAST TECHNIQUE: Multidetector CT imaging of the abdomen and pelvis was performed using the standard protocol following bolus administration of intravenous contrast. CONTRAST:  OMNIPAQUE IOHEXOL 300 MG/ML  SOLN COMPARISON:  September 30, 2020. FINDINGS: Lower chest: Minimal bibasilar subsegmental atelectasis is noted. Hepatobiliary: Findings concerning for hepatic cirrhosis. No gallstones or biliary dilatation is noted. Pancreas: Unremarkable. No pancreatic ductal dilatation or surrounding inflammatory changes. Spleen: Normal in size without focal abnormality. Adrenals/Urinary Tract: Adrenal glands and kidneys appear normal. Urinary bladder is decompressed secondary to Foley catheter. Stomach/Bowel: Stomach is within normal limits. Appendix appears normal. No evidence of bowel wall thickening, distention, or inflammatory changes. Vascular/Lymphatic: No adenopathy is noted. Gastric varicosities are noted consistent with hepatic cirrhosis. Reproductive: Prostate is unremarkable. Other: Percutaneous catheter draining left psoas muscle on prior exam has been removed. No significant residual fluid collection is noted. Left iliacus fluid collection noted on prior exam is nearly completely decompressed currently. Intramuscular fluid collection seen in the left hip region also appear to be significantly smaller compared to prior exam. The largest such fluid collection measures 3.8 x 1.1 cm. Mild to moderate subcutaneous edema is noted in the bilateral hip regions,  left greater than right. Mild to moderate ascites is noted in the pelvis and around the liver. Musculoskeletal: Status post left hip arthroplasty. No acute osseous abnormality is noted. IMPRESSION: Minimal bibasilar subsegmental atelectasis. Hepatic cirrhosis is noted.  Mild to moderate ascites is noted. Percutaneous drainage catheter that was noted in left psoas muscle on prior exam has been removed. No significant residual fluid collection is noted. Fluid collections noted on prior exam within the left iliacus muscle as well as in the musculature surrounding the left hip are either resolved or significantly smaller compared to prior exam. Electronically Signed   By: Lupita Raider M.D.   On: 10/19/2020 12:49   CT FEMUR LEFT W CONTRAST  Result Date: 10/19/2020 CLINICAL DATA:  Soft tissue infection EXAM: CT OF THE LOWER LEFT EXTREMITY WITH CONTRAST TECHNIQUE: Multidetector CT imaging of the lower left extremity was performed according to the standard protocol following intravenous contrast administration. CONTRAST:  OMNIPAQUE IOHEXOL 300 MG/ML  SOLN COMPARISON:  Radiographs of 10/19/2020 FINDINGS: Bones/Joint/Cartilage Left hip hemiarthroplasty. No compelling bony destructive findings characteristic of osteomyelitis. Spurring along the left acetabulum. Streak artifact from the hip implant obscures surrounding soft tissues and the joint space. Small knee joint effusion without enhancing synovitis along the suprapatellar bursa. Ligaments Suboptimally assessed by CT. Muscles and Tendons Abnormal band of enhancement with some central hypoenhancement extending vertically in the gluteus minimus muscle measuring about 1.5 by 1.2 cm on image 10 of series 4 and slightly longer vertically, incipient abscess not excluded. Several vastus intermedius abscesses are present. A lesion measured on image 123 series 4 measures 1.5 by 1.5 by  8.4 cm (volume = 9.9 cm^3). A lesion more medially on image 128 of series 4 measures  1.9 by 0.8 by 5.4 cm (volume = 4 cm^3). An abscess further distally on image 222 series 4 measures 2.4 by 1.9 by 13.9 cm (volume = 33 cm^3). Adjacent to this is an abscess measuring 1.6 by 1.2 by 6.0 cm (volume = 6 cm^3) on image 189 of series 4. Abscesses track along the semimembranosus muscle including a 3.9 by 0.9 by 14.0 cm (volume = 30 cm^3) lesion along the deep margin of the muscle distally measured on image 261 of series 4, and a lesion along the superficial fascia margin measuring 4.5 by 1.6 by 23.0 cm (volume = 87 cm^3). Soft tissues There is a wound along the superficial margin of the left sartorius muscle proximally probably from drainage. Fluid collection in the proximal subcutaneous tissues of the posteromedial thigh with enhancing margins measures about 2.2 by 6.9 by 12.8 cm (volume = 100 cm^3). Diffuse subcutaneous edema in the thigh and overlying the hip. Pelvic ascites. Abnormal fluid and marginal enhancement in the left hemiscrotum with cutaneous thickening, infection involving the scrotum is not excluded. IMPRESSION: 1. Subcutaneous and intramuscular abscesses in the left thigh as described above. Intramuscular abscesses are particularly notable in the vastus intermedius although there is also involvement of the margin of the semimembranosus muscle and involvement of the gluteus minimus muscle. 2. Fluid with abnormal enhancing margin in the left hemiscrotum, pyocele not excluded. Skin thickening in the left hemiscrotum. 3. Diffuse subcutaneous edema in the thigh, cellulitis is a possibility. 4. Pelvic ascites. Electronically Signed   By: Gaylyn Rong M.D.   On: 10/19/2020 13:19   DG HIP UNILAT WITH PELVIS 2-3 VIEWS LEFT  Result Date: 10/19/2020 CLINICAL DATA:  Hip replacement. EXAM: DG HIP (WITH OR WITHOUT PELVIS) 2-3V LEFT COMPARISON:  10/04/2020.  CT 09/30/2020. FINDINGS: Interim removal of drainage catheter about the left hip. Soft tissue wound over the medial left thigh again noted.  Left hip replacement again noted. No acute bony abnormality. No evidence of fracture or dislocation. IMPRESSION: 1. Interval removal of drainage catheter about the left hip. Persistent wound noted over the medial left thigh. 2.  Left hip replacement again noted.  No acute bony abnormality. Electronically Signed   By: Maisie Fus  Register   On: 10/19/2020 07:21   Recent Labs    10/19/20 1401  WBC 18.0*  HGB 9.8*  HCT 31.0*  PLT 350   No results for input(s): NA, K, CL, CO2, GLUCOSE, BUN, CREATININE, CALCIUM in the last 72 hours.  Intake/Output Summary (Last 24 hours) at 10/20/2020 0840 Last data filed at 10/20/2020 0543 Gross per 24 hour  Intake 300 ml  Output 1350 ml  Net -1050 ml        Physical Exam: Vital Signs Blood pressure 110/75, pulse 94, temperature 98.1 F (36.7 C), resp. rate 16, height 5\' 9"  (1.753 m), weight 82.8 kg, SpO2 95 %.  General: No acute distress Mood and affect are appropriate Heart: Regular rate and rhythm no rubs murmurs or extra sounds Lungs: Clear to auscultation, breathing unlabored, no rales or wheezes Abdomen: Positive bowel sounds, soft nontender to palpation, nondistended Extremities: No clubbing, cyanosis, or edema  GU: + Foley, Left scrotum inurated but non tender  Skin: Warm and dry.  Left thigh with VAC Psych: Normal mood.  Normal behavior. Musc: Bilateral lower extremity edema, no pain to palpation left ant and post thigh  Neuro: Alert Bilateral upper extremities:  5/5 proximal distal Right lower extremity: 4/5 proximal distally left lower extremity: Hip flexion, knee extension 2+/5 (pain inhibition)   Assessment/Plan: 1. Functional deficits which require 3+ hours per day of interdisciplinary therapy in a comprehensive inpatient rehab setting.  Physiatrist is providing close team supervision and 24 hour management of active medical problems listed below.  Physiatrist and rehab team continue to assess barriers to discharge/monitor patient  progress toward functional and medical goals  Care Tool:  Bathing    Body parts bathed by patient: Right arm,Left arm,Chest,Abdomen,Right upper leg,Right lower leg,Left lower leg,Face,Buttocks   Body parts bathed by helper: Front perineal area,Left upper leg Body parts n/a: Front perineal area,Buttocks,Left upper leg   Bathing assist Assist Level: Moderate Assistance - Patient 50 - 74%     Upper Body Dressing/Undressing Upper body dressing   What is the patient wearing?: Hospital gown only    Upper body assist Assist Level: Moderate Assistance - Patient 50 - 74%    Lower Body Dressing/Undressing Lower body dressing    Lower body dressing activity did not occur: Safety/medical concerns (Patient with multiple drains and wound vac) What is the patient wearing?: Hospital gown only     Lower body assist Assist for lower body dressing: Moderate Assistance - Patient 50 - 74%     Toileting Toileting    Toileting assist Assist for toileting: Dependent - Patient 0% (foley)     Transfers Chair/bed transfer  Transfers assist     Chair/bed transfer assist level: Contact Guard/Touching assist Chair/bed transfer assistive device: Walker,Armrests   Locomotion Ambulation   Ambulation assist      Assist level: Contact Guard/Touching assist Assistive device: Walker-rolling Max distance: 30   Walk 10 feet activity   Assist  Walk 10 feet activity did not occur: Safety/medical concerns  Assist level: Contact Guard/Touching assist Assistive device: Walker-rolling   Walk 50 feet activity   Assist Walk 50 feet with 2 turns activity did not occur: Safety/medical concerns  Assist level: Contact Guard/Touching assist      Walk 150 feet activity   Assist Walk 150 feet activity did not occur: Safety/medical concerns         Walk 10 feet on uneven surface  activity   Assist Walk 10 feet on uneven surfaces activity did not occur: Safety/medical  concerns   Assist level: Contact Guard/Touching assist Assistive device: PhotographerWalker-rolling   Wheelchair     Assist Will patient use wheelchair at discharge?: Yes Type of Wheelchair: Manual    Wheelchair assist level: Supervision/Verbal cueing Max wheelchair distance: 150    Wheelchair 50 feet with 2 turns activity    Assist        Assist Level: Supervision/Verbal cueing   Wheelchair 150 feet activity     Assist      Assist Level: Supervision/Verbal cueing   Blood pressure 110/75, pulse 94, temperature 98.1 F (36.7 C), resp. rate 16, height 5\' 9"  (1.753 m), weight 82.8 kg, SpO2 95 %.     Medical Problem List and Plan: 1.  Debility secondary to septic shock/left gluteal/groin abscesses/septic arthritis with history of avascular necrosis left hip  Continue CIR, tent d/c 4/8 which may change due to findings on CT   2.  Impaired mobility: -DVT/anticoagulation: Continue SCDs.  Venous Doppler studies negative             -antiplatelet therapy: N/A 3. Pain Management: Continue MS Contin 15 mg every 12 hours, MSIR 15 mg every 6 hours as needed-using regularly.  Controlled with meds, only using MSIR 15mg  one tab every other day 4. Depression: Continue Zoloft 50 mg daily.  Provide emotional support             -antipsychotic agents: N/A 5. Neuropsych: This patient is capable of making decisions on his own behalf. 6. Skin/Wound Care: Routine skin checks 7. Fluids/Electrolytes/Nutrition: Routine in and outs 8.  AVN left hip.  Status post left total hip arthroplasty with antibiotic spacer left hip excisional debridement of skin subcutaneous tissue muscle bone deep abscess 09/15/2020 per Dr. 09/17/2020.    Weightbearing as tolerated with posterior hip precautions 9.  Left groin and left hip abscess.  Status post multiple I&D's.  Wound VAC as directed-can likely be removed next week.- Gen Surg-Dr Cornett.  , drains removed with smaller fluid collections 10.  ID.  Continue  ceftezole 2 g every 8 hours and Flagyl 500 mg every 8 hours and rifampin 300 mg every 12 hours through 10/28/2020 and stop.  Follow-up infectious disease Increased leukocytosis of 18 on 3/30 but remains afeb With increased fluid collection , thin sheets along the vastus int and semimembranosus, afebrile, non tender but WBC are up await ortho rec, may need image guided aspiration 11.  Hepatic cirrhosis with ascites.  Status post multiple paracentesis.  GI recommends outpatient EGD.  Latest paracentesis 10/12/2020.  Continue Chronulac 10 mg twice daily.    12.  Scrotal edema.  Follow-up urology services Dr. 10/14/2020.  No concern for infection.  Continue Foley tube until edema improved and then plan voiding trial, left scrotum indurated but non tender will consult with Uro  13.  Anemia/thrombocytopenia.    Hemoglobin 8.5 on 3/24, continue to monitor 14.  Hypertension.  Lopressor 50 mg twice daily, Aldactone 100 mg daily, Lasix 40 mg daily  Controlled on 3/27 15.  Tobacco abuse.  Counseling 16. Hyponatremia:   Sodium 129 on 3/25, 132 on 3/28, monitor trend, asymptomatic likely diuretic related  17. Hypoalbuminemia: 1.6, continue high protein foods, recommended girlfriend bring in nuts, Still 1.4, + proteinuria  LOS: 10 days A FACE TO FACE EVALUATION WAS PERFORMED  4/28 10/20/2020, 8:40 AM

## 2020-10-20 NOTE — Progress Notes (Signed)
Repeat CT's ordered yesterday due to continued drainage from left hip incision. CT showed multiple new subcutaneous and intramuscular abscesses in the left thigh with diffuse subcutaneous edema in the thigh. Repeat CBC shows WBC increase to 18. Have discussed case with Dr. Lajoyce Corners who will consult regarding need for possible repeat surgical intervention.   Janine Ores, PA-C

## 2020-10-20 NOTE — Progress Notes (Signed)
Patient ID: [name], male   DOB: [birth year], 35 y.o.   MRN: [id number]   Pt WC ordered through [employer]  Lavera Guise, BSW [phone]

## 2020-10-20 NOTE — Progress Notes (Signed)
Patient ID: [name], male   DOB: [birth year], 35 y.o.   MRN: [id number]   [facility] list provided to pt for review.   [place], [place] [phone]

## 2020-10-20 NOTE — Progress Notes (Signed)
Occupational Therapy Session Note  Patient Details  Name: [name] MRN: [id number] Date of Birth: 28-Aug-1985  Today's Date: 10/20/2020 OT Individual Time: 1330-1400 OT Individual Time Calculation (min): 30 min    Short Term Goals: Week 1:  OT Short Term Goal 1 (Week 1): Patient will complete sit<>stand in Stedy with mod A OT Short Term Goal 2 (Week 1): Pt will tolerate sitting EOB for 10 minutes within BADL task OT Short Term Goal 3 (Week 1): Pt will use LH sponge to help was LEs Week 2:     Skilled Therapeutic Interventions/Progress Updates:    1:1 Pt in bed when arrived. Pt able to come to EOB with HOB elevated with extra time and encouragement. Pt ambulated from EOB to sink with min guard with extra time with heavy reliance on UE on RW. Pt stood to brush teeth and wash face at sink with min guard to supervision; pt tolerated weight bearing on both left LE. After seated rest break practiced stepping over a threshold with RW (simulated stepping backwards into shower stall ) with min A. Also practiced stepping up and down a 2 inch step multiple times with min A with standing break inbetween practices (of up and down).   Left sitting up in the recliner per pt's request.     Therapy Documentation Precautions:  Precautions Precautions: Fall,Posterior Hip,Other (comment) Precaution Comments: foley, wound vac L thigh, L JP drains L thigh Restrictions Weight Bearing Restrictions: No LLE Weight Bearing: Weight bearing as tolerated Other Position/Activity Restrictions: 3 JP drains in Lt anterior thigh and wound vac Pain: Reports pain in left Le with activity but able to continue with practice  Therapy/Group: Individual Therapy  Roney Mans [facility] 10/20/2020, 3:26 PM

## 2020-10-20 NOTE — Progress Notes (Signed)
Occupational Therapy Weekly Progress Note  Patient Details  Name: [name] MRN: [id number] Date of Birth: [birth year]  Beginning of progress report period: October 11, 2020 End of progress report period: October 20, 2020  Today's Date: 10/20/2020 OT Individual Time: 1450-1535 OT Individual Time Calculation (min): 45 min    Patient has met 3 of 3 short term goals.  [name] is making steady progress with OT at this time.  He currently completes all UB selfcare at supervision level.  LB bathing is completed at min assist with use of the LH sponge and a reacher.  LB dressing is also at a min assist level with use of the sockaide and the reacher.  Transfers to the toilet as well as the wheelchair are currently min assist level as well with use of the RW.  Pain continues to limit mobility and transfers as well but continues to improve over time.  Based on pt's current progress, feel his discharge date could be moved up and is now scheduled for April 8.  Recommend continued CIR level therapy with goals set at min guard to supervision.    Patient continues to demonstrate the following deficits: muscle weakness and muscle joint tightness, decreased cardiorespiratoy endurance and decreased standing balance and decreased balance strategies and therefore will continue to benefit from skilled OT intervention to enhance overall performance with BADL and Reduce care partner burden.  Patient progressing toward long term goals..  Continue plan of care.  OT Short Term Goals Week 2:  OT Short Term Goal 1 (Week 2): Continue working on established LTGs set a min guard to supervision.  Skilled Therapeutic Interventions/Progress Updates:    Pt worked on sit to stand and standing balance while engaged in Wii activity.  Min assist was needed for sit to stand with min guard to maintain standing balance for intervals of 4-6 mins.  He was able to maintain balance while releasing the RUE to use the Wii controller.   Increased pain is still present in the LLE with transitional movements as well as sit to stand, but did not get above a level five per pt report.  Finished session with return to the room via wheelchair and functional mobility from the chair at the sink to the bed with min assist.  Mod assist was needed for lifting his LEs into the bed without use of a leg lifter or sheet.  Call button and phone in reach with safety belt in place.    Therapy Documentation Precautions:  Precautions Precautions: Fall,Posterior Hip,Other (comment) Precaution Comments: foley, wound vac L thigh, L JP drains L thigh Restrictions Weight Bearing Restrictions: No LLE Weight Bearing: Weight bearing as tolerated Other Position/Activity Restrictions: 3 JP drains in Lt anterior thigh and wound vac  Pain: Pain Assessment Pain Scale: 0-10 Pain Score: 5  Pain Type: Acute pain Pain Location: Leg Pain Orientation: Left Pain Descriptors / Indicators: Discomfort Pain Onset: With Activity Pain Intervention(s): Repositioned;Emotional support ADL: See Care Tool Section for some details of mobility and selfcare  Therapy/Group: Individual Therapy  [name] OTR/L 10/20/2020, 4:59 PM

## 2020-10-20 NOTE — Progress Notes (Signed)
pts significant other Blenda Bridegroom [phone]) would like to to speak with the doctor regarding questions concerning pts CT results.   Rayburn Ma, LPN

## 2020-10-20 NOTE — Progress Notes (Signed)
Dressing chnged on  lft posterior hip wound.

## 2020-10-20 NOTE — Progress Notes (Signed)
Q shift dressing changes done, pt has slight raised rash on right arm with moderate itching. Hydrocortisone cream applied.   Rayburn Ma, LPN

## 2020-10-20 NOTE — Progress Notes (Signed)
Physical Therapy Session Note  Patient Details  Name: [name] MRN: [id number] Date of Birth: [birth year]  Today's Date: 10/20/2020 PT Individual Time: 1020-1058 PT Individual Time Calculation (min): 38 min   Short Term Goals: Week 2:  PT Short Term Goal 1 (Week 2): Patient will complete bed mobility with CGA consistently PT Short Term Goal 2 (Week 2): Patient will complete sit <>stand with LRAD and MinA PT Short Term Goal 3 (Week 2): Patient will tolerate flexing L knee >25* in supine  Skilled Therapeutic Interventions/Progress Updates:   Pt received sitting in WC and agreeable to PT. Pt transported to Hospital gift shop in [facility]. WC mobility through simulated community environment with supervision assist-min assist to manage tight corners and decrease turning radius to protect the LLE. Pt then performed gait training through gift ship x 65f with CGA from PT and min cues for Ad management through narrow gap between shelves. Pt required prolonged rest break between bouts due to increasing LLE pain and fatigue. Pt returned to room and performed ambulatory transfer to bed with RW and min assist for safety; noted increased WB through the LLE compared to previous treatment sessions. Sit>supine completed with min assist for management of the LLE only. Pt left supine in bed with call bell in reach and all needs met. Throughout session pt required only min assist for all sit<>stand transfers        Therapy Documentation Precautions:  Precautions Precautions: Fall,Posterior Hip,Other (comment) Precaution Comments: foley, wound vac L thigh, L JP drains L thigh Restrictions Weight Bearing Restrictions: No LLE Weight Bearing: Weight bearing as tolerated Other Position/Activity Restrictions: 3 JP drains in Lt anterior thigh and wound vac    Pain: Pain Assessment Pain Scale: 0-10 Pain Score: 5  Pain Type: Acute pain Pain Location: Leg Pain Orientation: Left Pain Radiating Towards:  groin Pain Descriptors / Indicators: Aching Pain Intervention(s): Medication (See eMAR)    Therapy/Group: Individual Therapy  ALorie Phenix3/31/2022, 11:01 AM

## 2020-10-21 LAB — CBC WITH DIFFERENTIAL/PLATELET
Abs Immature Granulocytes: 0.11 10*3/uL — ABNORMAL HIGH (ref 0.00–0.07)
Basophils Absolute: 0 10*3/uL (ref 0.0–0.1)
Basophils Relative: 0 %
Eosinophils Absolute: 0.7 10*3/uL — ABNORMAL HIGH (ref 0.0–0.5)
Eosinophils Relative: 5 %
HCT: 29.9 % — ABNORMAL LOW (ref 39.0–52.0)
Hemoglobin: 9.2 g/dL — ABNORMAL LOW (ref 13.0–17.0)
Immature Granulocytes: 1 %
Lymphocytes Relative: 16 %
Lymphs Abs: 2.3 10*3/uL (ref 0.7–4.0)
MCH: 31.9 pg (ref 26.0–34.0)
MCHC: 30.8 g/dL (ref 30.0–36.0)
MCV: 103.8 fL — ABNORMAL HIGH (ref 80.0–100.0)
Monocytes Absolute: 1.2 10*3/uL — ABNORMAL HIGH (ref 0.1–1.0)
Monocytes Relative: 9 %
Neutro Abs: 9.6 10*3/uL — ABNORMAL HIGH (ref 1.7–7.7)
Neutrophils Relative %: 69 %
Platelets: 363 10*3/uL (ref 150–400)
RBC: 2.88 MIL/uL — ABNORMAL LOW (ref 4.22–5.81)
RDW: 15.9 % — ABNORMAL HIGH (ref 11.5–15.5)
WBC: 14 10*3/uL — ABNORMAL HIGH (ref 4.0–10.5)
nRBC: 0 % (ref 0.0–0.2)

## 2020-10-21 LAB — C-REACTIVE PROTEIN: CRP: 5.2 mg/dL — ABNORMAL HIGH (ref <1.0)

## 2020-10-21 LAB — SEDIMENTATION RATE: Sed Rate: 87 mm/hr — ABNORMAL HIGH (ref 0–16)

## 2020-10-21 MED ORDER — SODIUM CHLORIDE 0.9% FLUSH
10.0000 mL | INTRAVENOUS | Status: DC | PRN
  Administered 2020-10-21: 20 mL
  Administered 2020-10-27: 10 mL

## 2020-10-21 NOTE — Progress Notes (Addendum)
Discussed patient with Dr. Paschal Dopp who agrees with surgical drainage of abscess--await new cultures and no change in current antibiotic regimen as patient  Stable.

## 2020-10-21 NOTE — Progress Notes (Signed)
Physical Therapy Session Note  Patient Details  Name: [name] MRN: [id number] Date of Birth: 1985-10-27  Today's Date: 10/21/2020 PT Individual Time: 0920-1024 PT Individual Time Calculation (min): 64 min   Short Term Goals: Week 2:  PT Short Term Goal 1 (Week 2): Patient will complete bed mobility with CGA consistently PT Short Term Goal 2 (Week 2): Patient will complete sit <>stand with LRAD and MinA PT Short Term Goal 3 (Week 2): Patient will tolerate flexing L knee >25* in supine   Skilled Therapeutic Interventions/Progress Updates:   Pt received sitting in WC and agreeable to PT.   WC mobility through hall x 159f and 1639fwith supervision assist only from Pt with cues for doorway amangement and awareness of the LLE position on ELR.   Seated LLE knee flexion/extension x 12 to improve ROM and. Pt then performed sit>stand with CGA progressing to min assist due to fatigue and increased pain on the LLE.   Standing tolerance performed with lateral reach o the L then bean bag toss to target with the RUE x 12. Pt then performed bean bag pick up with reacher from standing with min assist from PT for safety and 1 uE supported on RW. [facility]   Sit stand to prepare for gait. Reports dizziness in standing. Orthostatics.  Seated: 125/89, HR 102.  Standing 105/84, HR 120 with severe orthostatic s/s.  PT donned knee high teds.  Seated: 108/86, HR 100. With continued S/s rested in WC x 2 minute  Seated 115/79. Standing with ted in place. 101/77 with severe orthostatic s/s.   Patient returned to room and [facility] reach and all needs met.          Therapy Documentation Precautions:  Precautions Precautions: Fall,Posterior Hip,Other (comment) Precaution Comments: foley, wound vac L thigh, L JP drains L thigh Restrictions Weight Bearing Restrictions: No LLE Weight Bearing: Weight bearing as tolerated Other  Position/Activity Restrictions: 3 JP drains in Lt anterior thigh and wound vac   Pain: Pain Assessment Pain Scale: 0-10 Pain Score: 5  Pain Type: Acute pain Pain Location: Leg Pain Orientation: Left;Mid Pain Descriptors / Indicators: Aching Patients Stated Pain Goal: 3 Pain Intervention(s): Ambulation/increased activity;Medication (See eMAR)    Therapy/Group: Individual Therapy  AusLorie Phenix1/2022, 12:00 PM

## 2020-10-21 NOTE — Progress Notes (Signed)
Physical Therapy Session Note  Patient Details  Name: [name] MRN: [id number] Date of Birth: September 27, 1985  Today's Date: 10/21/2020 PT Individual Time: 3474-2595 PT Individual Time Calculation (min): 58 min   Short Term Goals: Week 1:  PT Short Term Goal 1 (Week 1): Patient will complete bed mobility with ModA PT Short Term Goal 1 - Progress (Week 1): Met PT Short Term Goal 2 (Week 1): Patient will ambulate at least 76f with LRAD and +2 assist as needed, MaxA PT Short Term Goal 2 - Progress (Week 1): Met PT Short Term Goal 3 (Week 1): Patient will transfer bed <> wc with LRAD and MaxA PT Short Term Goal 3 - Progress (Week 1): Met Week 2:  PT Short Term Goal 1 (Week 2): Patient will complete bed mobility with CGA consistently PT Short Term Goal 2 (Week 2): Patient will complete sit <>stand with LRAD and MinA PT Short Term Goal 3 (Week 2): Patient will tolerate flexing L knee >25* in supine  Skilled Therapeutic Interventions/Progress Updates:    Patient seated upright in w/c upon PT arrival. RN has initiated IV abx treatment. Patient alert and agreeable to PT session. Patient relates continued pain in LLE and L hip during session.  At start of session, pt relates feeling anxious about return home in one week's time. When asked what he is most anxious about, pt is unable to relate anything specific. Treatment session spent performing household mobility throughout room and therapy apartment in order to build pt's confidence in functional mobility.   Therapeutic Activity: Bed Mobility: At end of session, patient performed sit--> supine with CGA/ Min A for LLE to bed surface. Requires extra time to complete. Provided vc for technique and positioning. Transfers: Patient performed STS and SPVT transfers throughout session to/ from various surfaces/ heights to RW with supervision/ CGA d/t fatigue/ pain. Provided verbal cues for technique to maintain L hip precautions in STS to/ from  recliner.  Gait Training:  Patient ambulated several bouts of household distances up to 60 feet using RW with CGA increasing to supervision. Pt requires therapeutic rest between bouts d/t fatigue and pain. Ambulated with spinal shift to R during L swing phase. Provided verbal cues for maintaining pace and attempt to decrease R lateral shift and increase time in LLE stance phase.   Patient supine in bed at end of session with brakes locked, bed alarm set, and all needs within reach.  Therapy Documentation Precautions:  Precautions Precautions: Fall,Posterior Hip,Other (comment) Precaution Comments: foley, wound vac L thigh, L JP drains L thigh Restrictions Weight Bearing Restrictions: No LLE Weight Bearing: Weight bearing as tolerated Other Position/Activity Restrictions: 3 JP drains in Lt anterior thigh and wound vac  Therapy/Group: Individual Therapy  JAlger SimonsPT, DPT 10/21/2020, 2:05 PM

## 2020-10-21 NOTE — Progress Notes (Signed)
PROGRESS NOTE   Subjective/Complaints: Discussed scrotal swelling  With Uro- Dr Louis Meckel who rec US Scrotum with results as below No thigh swelling, discussed with LPN, L thigh drainage clear an dnot malodorous  Abd ascites fluid is accumulating again ROS: Denies CP, SOB, N/V/D  Objective:   CT ABDOMEN PELVIS W CONTRAST  Result Date: 10/19/2020 CLINICAL DATA:  Wound dehiscence and drainage. EXAM: CT ABDOMEN AND PELVIS WITH CONTRAST TECHNIQUE: Multidetector CT imaging of the abdomen and pelvis was performed using the standard protocol following bolus administration of intravenous contrast. CONTRAST:  173m OMNIPAQUE IOHEXOL 300 MG/ML  SOLN COMPARISON:  September 30, 2020. FINDINGS: Lower chest: Minimal bibasilar subsegmental atelectasis is noted. Hepatobiliary: Findings concerning for hepatic cirrhosis. No gallstones or biliary dilatation is noted. Pancreas: Unremarkable. No pancreatic ductal dilatation or surrounding inflammatory changes. Spleen: Normal in size without focal abnormality. Adrenals/Urinary Tract: Adrenal glands and kidneys appear normal. Urinary bladder is decompressed secondary to Foley catheter. Stomach/Bowel: Stomach is within normal limits. Appendix appears normal. No evidence of bowel wall thickening, distention, or inflammatory changes. Vascular/Lymphatic: No adenopathy is noted. Gastric varicosities are noted consistent with hepatic cirrhosis. Reproductive: Prostate is unremarkable. Other: Percutaneous catheter draining left psoas muscle on prior exam has been removed. No significant residual fluid collection is noted. Left iliacus fluid collection noted on prior exam is nearly completely decompressed currently. Intramuscular fluid collection seen in the left hip region also appear to be significantly smaller compared to prior exam. The largest such fluid collection measures 3.8 x 1.1 cm. Mild to moderate subcutaneous edema is  noted in the bilateral hip regions, left greater than right. Mild to moderate ascites is noted in the pelvis and around the liver. Musculoskeletal: Status post left hip arthroplasty. No acute osseous abnormality is noted. IMPRESSION: Minimal bibasilar subsegmental atelectasis. Hepatic cirrhosis is noted.  Mild to moderate ascites is noted. Percutaneous drainage catheter that was noted in left psoas muscle on prior exam has been removed. No significant residual fluid collection is noted. Fluid collections noted on prior exam within the left iliacus muscle as well as in the musculature surrounding the left hip are either resolved or significantly smaller compared to prior exam. Electronically Signed   By: JMarijo ConceptionM.D.   On: 10/19/2020 12:49   CT FEMUR LEFT W CONTRAST  Result Date: 10/19/2020 CLINICAL DATA:  Soft tissue infection EXAM: CT OF THE LOWER LEFT EXTREMITY WITH CONTRAST TECHNIQUE: Multidetector CT imaging of the lower left extremity was performed according to the standard protocol following intravenous contrast administration. CONTRAST:  1012mOMNIPAQUE IOHEXOL 300 MG/ML  SOLN COMPARISON:  Radiographs of 10/19/2020 FINDINGS: Bones/Joint/Cartilage Left hip hemiarthroplasty. No compelling bony destructive findings characteristic of osteomyelitis. Spurring along the left acetabulum. Streak artifact from the hip implant obscures surrounding soft tissues and the joint space. Small knee joint effusion without enhancing synovitis along the suprapatellar bursa. Ligaments Suboptimally assessed by CT. Muscles and Tendons Abnormal band of enhancement with some central hypoenhancement extending vertically in the gluteus minimus muscle measuring about 1.5 by 1.2 cm on image 10 of series 4 and slightly longer vertically, incipient abscess not excluded. Several vastus intermedius abscesses are present. A lesion measured on image  123 series 4 measures 1.5 by 1.5 by 8.4 cm (volume = 9.9 cm^3). A lesion more  medially on image 128 of series 4 measures 1.9 by 0.8 by 5.4 cm (volume = 4 cm^3). An abscess further distally on image 222 series 4 measures 2.4 by 1.9 by 13.9 cm (volume = 33 cm^3). Adjacent to this is an abscess measuring 1.6 by 1.2 by 6.0 cm (volume = 6 cm^3) on image 189 of series 4. Abscesses track along the semimembranosus muscle including a 3.9 by 0.9 by 14.0 cm (volume = 30 cm^3) lesion along the deep margin of the muscle distally measured on image 261 of series 4, and a lesion along the superficial fascia margin measuring 4.5 by 1.6 by 23.0 cm (volume = 87 cm^3). Soft tissues There is a wound along the superficial margin of the left sartorius muscle proximally probably from drainage. Fluid collection in the proximal subcutaneous tissues of the posteromedial thigh with enhancing margins measures about 2.2 by 6.9 by 12.8 cm (volume = 100 cm^3). Diffuse subcutaneous edema in the thigh and overlying the hip. Pelvic ascites. Abnormal fluid and marginal enhancement in the left hemiscrotum with cutaneous thickening, infection involving the scrotum is not excluded. IMPRESSION: 1. Subcutaneous and intramuscular abscesses in the left thigh as described above. Intramuscular abscesses are particularly notable in the vastus intermedius although there is also involvement of the margin of the semimembranosus muscle and involvement of the gluteus minimus muscle. 2. Fluid with abnormal enhancing margin in the left hemiscrotum, pyocele not excluded. Skin thickening in the left hemiscrotum. 3. Diffuse subcutaneous edema in the thigh, cellulitis is a possibility. 4. Pelvic ascites. Electronically Signed   By: Van Clines M.D.   On: 10/19/2020 13:19   US SCROTUM W/DOPPLER  Result Date: 10/20/2020 CLINICAL DATA:  Left testicle swelling EXAM: SCROTAL ULTRASOUND DOPPLER ULTRASOUND OF THE TESTICLES TECHNIQUE: Complete ultrasound examination of the testicles, epididymis, and other scrotal structures was performed. Color  and spectral Doppler ultrasound were also utilized to evaluate blood flow to the testicles. COMPARISON:  09/27/2020 FINDINGS: Right testicle Measurements: 3.5 x 1.4 x 2.5 cm. No mass or microlithiasis visualized. Left testicle Measurements: 3 x 1.6 x 1.8 cm. No mass or microlithiasis visualized. Marked scrotal wall thickening and edema without focal fluid collection. Right epididymis:  Normal in size and appearance. Left epididymis:  Not well seen Hydrocele:  Trace right hydrocele. Varicocele:  None visualized. Pulsed Doppler interrogation of both testes demonstrates normal low resistance arterial and venous waveforms bilaterally. IMPRESSION: 1. Negative for testicular torsion or mass. 2. Marked left scrotal wall thickening and edema without definitive focal fluid collection 3. Trace right hydrocele Electronically Signed   By: Donavan Foil M.D.   On: 10/20/2020 20:17   Recent Labs    10/19/20 1401  WBC 18.0*  HGB 9.8*  HCT 31.0*  PLT 350   No results for input(s): NA, K, CL, CO2, GLUCOSE, BUN, CREATININE, CALCIUM in the last 72 hours.  Intake/Output Summary (Last 24 hours) at 10/21/2020 0852 Last data filed at 10/21/2020 0501 Gross per 24 hour  Intake 300 ml  Output 600 ml  Net -300 ml        Physical Exam: Vital Signs Blood pressure 112/75, pulse (!) 106, temperature 99.9 F (37.7 C), temperature source Oral, resp. rate 16, height 5' 9"  (1.753 m), weight 86.9 kg, SpO2 96 %.   General: No acute distress Mood and affect are appropriate Heart: Regular rate and rhythm no rubs murmurs or extra sounds  Lungs: Clear to auscultation, breathing unlabored, no rales or wheezes Abdomen: Positive bowel sounds, soft nontender to palpation, nondistended Extremities: No clubbing, cyanosis, or edema Skin: No evidence of breakdown, no evidence of rash   GU: + Foley, Left scrotum inurated but non tender  Skin: Warm and dry. Left thigh drain site with clear fluid on foam dressing  Psych: Normal mood.   Normal behavior. Musc: Bilateral lower extremity edema, no pain to palpation left ant and post thigh  Neuro: Alert Bilateral upper extremities: 5/5 proximal distal Right lower extremity: 4/5 proximal distally left lower extremity: Hip flexion, knee extension 2+/5 (pain inhibition)   Assessment/Plan: 1. Functional deficits which require 3+ hours per day of interdisciplinary therapy in a comprehensive inpatient rehab setting.  Physiatrist is providing close team supervision and 24 hour management of active medical problems listed below.  Physiatrist and rehab team continue to assess barriers to discharge/monitor patient progress toward functional and medical goals  Care Tool:  Bathing    Body parts bathed by patient: Right arm,Left arm,Chest,Abdomen,Right upper leg,Right lower leg,Left lower leg,Face,Buttocks   Body parts bathed by helper: Front perineal area,Left upper leg Body parts n/a: Front perineal area,Buttocks,Left upper leg   Bathing assist Assist Level: Moderate Assistance - Patient 50 - 74%     Upper Body Dressing/Undressing Upper body dressing   What is the patient wearing?: Hospital gown only    Upper body assist Assist Level: Moderate Assistance - Patient 50 - 74%    Lower Body Dressing/Undressing Lower body dressing    Lower body dressing activity did not occur: Safety/medical concerns (Patient with multiple drains and wound vac) What is the patient wearing?: Hospital gown only     Lower body assist Assist for lower body dressing: Moderate Assistance - Patient 50 - 74%     Toileting Toileting    Toileting assist Assist for toileting: Dependent - Patient 0% (foley)     Transfers Chair/bed transfer  Transfers assist     Chair/bed transfer assist level: Minimal Assistance - Patient > 75% Chair/bed transfer assistive device: Walker,Armrests   Locomotion Ambulation   Ambulation assist      Assist level: Contact Guard/Touching assist Assistive  device: Walker-rolling Max distance: 30   Walk 10 feet activity   Assist  Walk 10 feet activity did not occur: Safety/medical concerns  Assist level: Contact Guard/Touching assist Assistive device: Walker-rolling   Walk 50 feet activity   Assist Walk 50 feet with 2 turns activity did not occur: Safety/medical concerns  Assist level: Contact Guard/Touching assist      Walk 150 feet activity   Assist Walk 150 feet activity did not occur: Safety/medical concerns         Walk 10 feet on uneven surface  activity   Assist Walk 10 feet on uneven surfaces activity did not occur: Safety/medical concerns   Assist level: Contact Guard/Touching assist Assistive device: Aeronautical engineer Will patient use wheelchair at discharge?: Yes Type of Wheelchair: Manual    Wheelchair assist level: Supervision/Verbal cueing Max wheelchair distance: 150    Wheelchair 50 feet with 2 turns activity    Assist        Assist Level: Supervision/Verbal cueing   Wheelchair 150 feet activity     Assist      Assist Level: Supervision/Verbal cueing   Blood pressure 112/75, pulse (!) 106, temperature 99.9 F (37.7 C), temperature source Oral, resp. rate 16, height 5' 9"  (1.753 m), weight 86.9  kg, SpO2 96 %.     Medical Problem List and Plan: 1.  Debility secondary to septic shock/left gluteal/groin abscesses/septic arthritis with history of avascular necrosis left hip  Continue CIR, tent d/c 4/8 which may change due to findings on CT   2.  Impaired mobility: -DVT/anticoagulation: Continue SCDs.  Venous Doppler studies negative             -antiplatelet therapy: N/A 3. Pain Management: Continue MS Contin 15 mg every 12 hours, MSIR 15 mg every 6 hours as needed-using regularly.   Controlled with meds, only using MSIR 45m one tab every other day 4. Depression: Continue Zoloft 50 mg daily.  Provide emotional support             -antipsychotic  agents: N/A 5. Neuropsych: This patient is capable of making decisions on his own behalf. 6. Skin/Wound Care: Routine skin checks 7. Fluids/Electrolytes/Nutrition: Routine in and outs 8.  AVN left hip.  Status post left total hip arthroplasty with antibiotic spacer left hip excisional debridement of skin subcutaneous tissue muscle bone deep abscess 09/15/2020 per Dr. LMardelle Matte    Weightbearing as tolerated with posterior hip precautions 9.  Left groin and left hip abscess.  Status post multiple I&D's.  Wound VAC as directed-can likely be removed next week.- Gen Surg-Dr Cornett.  , drains removed with smaller fluid collections 10.  ID.  Continue ceftezole 2 g every 8 hours and Flagyl 500 mg every 8 hours and rifampin 300 mg every 12 hours through 10/28/2020 and stop.  Follow-up infectious disease Increased leukocytosis of 18 on 3/30 but remains afeb With increased fluid collection , thin sheets along the vastus int and semimembranosus, afebrile, non tender but WBC are up await ortho rec, may need image guided aspiration Will check CRP, ESR, ask ID to weigh in- clear fluid may be related to anasarca 11.  Hepatic cirrhosis with ascites.  Status post multiple paracentesis.  GI recommends outpatient EGD.  Latest paracentesis 10/12/2020.  Continue Chronulac 10 mg twice daily.   Will likely need repeat paracentesis next week  12.  Scrotal edema.  Follow-up urology services Dr. BAlinda Money  No concern for infection.  per Uro Dr HLouis Meckelok to remove foley and do voiding trial  13.  Anemia/thrombocytopenia.    Hemoglobin 8.5 on 3/24, continue to monitor 14.  Hypertension.  Lopressor 50 mg twice daily, Aldactone 100 mg daily, Lasix 40 mg daily  Controlled on 3/27 15.  Tobacco abuse.  Counseling 16. Hyponatremia:   Sodium 129 on 3/25, 132 on 3/28, monitor trend, asymptomatic likely diuretic related  17. Hypoalbuminemia: 1.6, continue high protein foods, recommended girlfriend bring in nuts, Still 1.4, +  proteinuria  LOS: 11 days [place] [name] 10/21/2020, 8:52 AM

## 2020-10-21 NOTE — Progress Notes (Addendum)
Subjective:  Denies left hip pain at rest this morning. He's frustrated with CT results and the possibility of needing to stay in the hospital longer.   Objective:  PE: VITALS:   Vitals:   10/20/20 0541 10/20/20 1328 10/20/20 1922 10/21/20 0512  BP: 110/75 109/70 112/75   Pulse: 94 96 (!) 106   Resp: 16 16 16    Temp: 98.1 F (36.7 C) 98.1 F (36.7 C) 99.9 F (37.7 C)   TempSrc:  Oral Oral   SpO2: 95% 97% 96%   Weight: 82.8 kg   86.9 kg  Height:       General: laying in bed in no acute distress MSK: Left hip incision continuing to drain serous fluid through erythema improved. Left anterior thigh incision improved, with no significant erythema or drainage, sutures intact. No TTP to left knee. Dorsiflexion and plantarflexion intact. 2+ edema at bilateral feet. Feet warm and well perfused. Distal sensation intact.   Addendum: Right leg reexamined per request of family. Patient does have a erythematous area where his scrotum has had contact with his medial right thigh as well as just posterior to this area.   LABS  No results found for this or any previous visit (from the past 24 hour(s)).  CT ABDOMEN PELVIS W CONTRAST  Result Date: 10/19/2020 CLINICAL DATA:  Wound dehiscence and drainage. EXAM: CT ABDOMEN AND PELVIS WITH CONTRAST TECHNIQUE: Multidetector CT imaging of the abdomen and pelvis was performed using the standard protocol following bolus administration of intravenous contrast. CONTRAST:  10/21/2020 OMNIPAQUE IOHEXOL 300 MG/ML  SOLN COMPARISON:  September 30, 2020. FINDINGS: Lower chest: Minimal bibasilar subsegmental atelectasis is noted. Hepatobiliary: Findings concerning for hepatic cirrhosis. No gallstones or biliary dilatation is noted. Pancreas: Unremarkable. No pancreatic ductal dilatation or surrounding inflammatory changes. Spleen: Normal in size without focal abnormality. Adrenals/Urinary Tract: Adrenal glands and kidneys appear normal. Urinary bladder is decompressed  secondary to Foley catheter. Stomach/Bowel: Stomach is within normal limits. Appendix appears normal. No evidence of bowel wall thickening, distention, or inflammatory changes. Vascular/Lymphatic: No adenopathy is noted. Gastric varicosities are noted consistent with hepatic cirrhosis. Reproductive: Prostate is unremarkable. Other: Percutaneous catheter draining left psoas muscle on prior exam has been removed. No significant residual fluid collection is noted. Left iliacus fluid collection noted on prior exam is nearly completely decompressed currently. Intramuscular fluid collection seen in the left hip region also appear to be significantly smaller compared to prior exam. The largest such fluid collection measures 3.8 x 1.1 cm. Mild to moderate subcutaneous edema is noted in the bilateral hip regions, left greater than right. Mild to moderate ascites is noted in the pelvis and around the liver. Musculoskeletal: Status post left hip arthroplasty. No acute osseous abnormality is noted. IMPRESSION: Minimal bibasilar subsegmental atelectasis. Hepatic cirrhosis is noted.  Mild to moderate ascites is noted. Percutaneous drainage catheter that was noted in left psoas muscle on prior exam has been removed. No significant residual fluid collection is noted. Fluid collections noted on prior exam within the left iliacus muscle as well as in the musculature surrounding the left hip are either resolved or significantly smaller compared to prior exam. Electronically Signed   By: October 02, 2020 M.D.   On: 10/19/2020 12:49   CT FEMUR LEFT W CONTRAST  Result Date: 10/19/2020 CLINICAL DATA:  Soft tissue infection EXAM: CT OF THE LOWER LEFT EXTREMITY WITH CONTRAST TECHNIQUE: Multidetector CT imaging of the lower left extremity was performed according to the standard protocol following  intravenous contrast administration. CONTRAST:  OMNIPAQUE IOHEXOL 300 MG/ML  SOLN COMPARISON:  Radiographs of 10/19/2020 FINDINGS:  Bones/Joint/Cartilage Left hip hemiarthroplasty. No compelling bony destructive findings characteristic of osteomyelitis. Spurring along the left acetabulum. Streak artifact from the hip implant obscures surrounding soft tissues and the joint space. Small knee joint effusion without enhancing synovitis along the suprapatellar bursa. Ligaments Suboptimally assessed by CT. Muscles and Tendons Abnormal band of enhancement with some central hypoenhancement extending vertically in the gluteus minimus muscle measuring about 1.5 by 1.2 cm on image 10 of series 4 and slightly longer vertically, incipient abscess not excluded. Several vastus intermedius abscesses are present. A lesion measured on image 123 series 4 measures 1.5 by 1.5 by 8.4 cm (volume = 9.9 cm^3). A lesion more medially on image 128 of series 4 measures 1.9 by 0.8 by 5.4 cm (volume = 4 cm^3). An abscess further distally on image 222 series 4 measures 2.4 by 1.9 by 13.9 cm (volume = 33 cm^3). Adjacent to this is an abscess measuring 1.6 by 1.2 by 6.0 cm (volume = 6 cm^3) on image 189 of series 4. Abscesses track along the semimembranosus muscle including a 3.9 by 0.9 by 14.0 cm (volume = 30 cm^3) lesion along the deep margin of the muscle distally measured on image 261 of series 4, and a lesion along the superficial fascia margin measuring 4.5 by 1.6 by 23.0 cm (volume = 87 cm^3). Soft tissues There is a wound along the superficial margin of the left sartorius muscle proximally probably from drainage. Fluid collection in the proximal subcutaneous tissues of the posteromedial thigh with enhancing margins measures about 2.2 by 6.9 by 12.8 cm (volume = 100 cm^3). Diffuse subcutaneous edema in the thigh and overlying the hip. Pelvic ascites. Abnormal fluid and marginal enhancement in the left hemiscrotum with cutaneous thickening, infection involving the scrotum is not excluded. IMPRESSION: 1. Subcutaneous and intramuscular abscesses in the left thigh as  described above. Intramuscular abscesses are particularly notable in the vastus intermedius although there is also involvement of the margin of the semimembranosus muscle and involvement of the gluteus minimus muscle. 2. Fluid with abnormal enhancing margin in the left hemiscrotum, pyocele not excluded. Skin thickening in the left hemiscrotum. 3. Diffuse subcutaneous edema in the thigh, cellulitis is a possibility. 4. Pelvic ascites. Electronically Signed   By: Gaylyn Rong M.D.   On: 10/19/2020 13:19   US SCROTUM W/DOPPLER  Result Date: 10/20/2020 CLINICAL DATA:  Left testicle swelling EXAM: SCROTAL ULTRASOUND DOPPLER ULTRASOUND OF THE TESTICLES TECHNIQUE: Complete ultrasound examination of the testicles, epididymis, and other scrotal structures was performed. Color and spectral Doppler ultrasound were also utilized to evaluate blood flow to the testicles. COMPARISON:  09/27/2020 FINDINGS: Right testicle Measurements: 3.5 x 1.4 x 2.5 cm. No mass or microlithiasis visualized. Left testicle Measurements: 3 x 1.6 x 1.8 cm. No mass or microlithiasis visualized. Marked scrotal wall thickening and edema without focal fluid collection. Right epididymis:  Normal in size and appearance. Left epididymis:  Not well seen Hydrocele:  Trace right hydrocele. Varicocele:  None visualized. Pulsed Doppler interrogation of both testes demonstrates normal low resistance arterial and venous waveforms bilaterally. IMPRESSION: 1. Negative for testicular torsion or mass. 2. Marked left scrotal wall thickening and edema without definitive focal fluid collection 3. Trace right hydrocele Electronically Signed   By: Jasmine Pang M.D.   On: 10/20/2020 20:17    Assessment/Plan: Lefthipseptic arthritis/AVN,multiple left thigh and retroperitonealabscesses - 2/23 - placement of drain into left  retroperitoneal abscess and two into left thigh by IR - 2/24 -left hip total arthroplasty with cement interposition spacer - 3/1 -  left thigh irrigation and debridement with wound vac placementby orthopedics, removal of left retroperitoneal drain - 3/3 - I&D of left groin abscess with change of wound vac by general surgery 3/4 - CT guided drainage of left thigh fluid collection by IR - 3/8 - left thigh repeat irrigation and debridement by orthopedics with partial closure of wound, incision and drainage of left thigh abscess/left groin abscessby general surgery 3/11 - Removal ofleftpsoas drain, CT guided aspiration ofleftiliacus abscess, CT guided drain placement of left anterior thigh abscess by IR - all drains and wound vacs have been removed  - repeat CT scan shows subcutaneous and intramuscular abscesses in the left thigh with diffuse subcutaneous edema in the thigh - will contact IR regarding aspiration vs drain placement for fluid collection - recommend continuing with BID dressing changes at left hip - have consulted Dr. Lajoyce Corners for his thoughts regarding need for possible repeat surgical intervention  Addendum: Right leg reexamined per family request, this looks to be some contact irritation from where scrotum has been rubbing against medial thigh. Would recommend ABD or gauze barrier between these areas, this does not look like an abscess or cellulitis. Spoke with Dr. Archer Asa with IR who does not think any of the abscess seen on this most recent CT are large enough to insert a drain. He is also unsure that aspiration of his many small fluid collection would provide clinical benefit. He recommended hearing Dr. Audrie Lia thoughts and then to reconsult.   Contact information:   Weekdays 8-5 [address] [place], [place] [phone] A fter hours and holidays please check [email or web address] for group call information for [facility]  Armida Sans 10/21/2020, 8:28 AM

## 2020-10-21 NOTE — Progress Notes (Signed)
Occupational Therapy Session Note  Patient Details  Name: [name] MRN: [id number] Date of Birth: [birth year]  Today's Date: 10/21/2020 OT Individual Time: 1103-1200 OT Individual Time Calculation (min): 57 min    Short Term Goals: Week 2:  OT Short Term Goal 1 (Week 2): Continue working on established LTGs set a min guard to supervision.  Skilled Therapeutic Interventions/Progress Updates:    Pt completed bathing and dressing sit to stand at the sink this session.  He was able to complete UB bathing with supervision as well as supervision for donning a hospital gown.  He was able to use the reacher for doffing his gripper socks with increased time and supervision with total assist for removal of TEDs.  He washed his lower legs and feet with setup of the LH sponge with min assist for washing buttocks and peri area sit to stand.  Therapist had to provide total assist for donning his TEDs and his gripper socks secondary to not being able to locate a sockaide, but pt is able to return demonstrate with supervision when using AE.  He continues to decline wearing pants, but agrees to try once his catheter is removed.  Finished session with completion of oral hygiene in sitting at modified independent level.  Call button and phone in reach with pt electing to stay up in the wheelchair.    Therapy Documentation Precautions:  Precautions Precautions: Fall,Posterior Hip,Other (comment) Precaution Comments: foley, wound vac L thigh, L JP drains L thigh Restrictions Weight Bearing Restrictions: No LLE Weight Bearing: Weight bearing as tolerated Other Position/Activity Restrictions: 3 JP drains in Lt anterior thigh and wound vac  Pain: Pain Assessment Pain Scale: Faces Pain Score: 5  Faces Pain Scale: Hurts a little bit Pain Type: Acute pain Pain Location: Leg Pain Orientation: Left Pain Descriptors / Indicators: Discomfort Pain Onset: With Activity Patients Stated Pain Goal: 3 Pain  Intervention(s): Repositioned ADL: See Care Tool Section for some details of mobility  Therapy/Group: Individual Therapy  Thania Woodlief OTR/L 10/21/2020, 12:57 PM

## 2020-10-21 NOTE — Progress Notes (Signed)
Patient ID: [name], male   DOB: [birth year], 35 y.o.   MRN: [id number] Follow up with the patient regarding new findings of abscess and plan for treatment. Discussed ability to complete some dressing changes himself and will need help with a couple. Reported significant other will be in on Monday for training and will have updated plan of care by then as well. Patient appears sad and concern new findings will delay discharge. Briefly reviewed IV abx can be given at home if needed, and training for IV abx administration with family will be completed prior to discharge if needed. Continue to follow along to discharge to address educational needs. Pamelia Hoit

## 2020-10-22 DIAGNOSIS — M792 Neuralgia and neuritis, unspecified: Secondary | ICD-10-CM

## 2020-10-22 DIAGNOSIS — K7031 Alcoholic cirrhosis of liver with ascites: Secondary | ICD-10-CM

## 2020-10-22 MED ORDER — GABAPENTIN 600 MG PO TABS
300.0000 mg | ORAL_TABLET | Freq: Two times a day (BID) | ORAL | Status: DC
  Administered 2020-10-22 – 2020-10-28 (×12): 300 mg via ORAL
  Filled 2020-10-22 (×12): qty 1

## 2020-10-22 NOTE — Progress Notes (Signed)
PROGRESS NOTE   Subjective/Complaints:  Patient reports nerve pain in his toes especially on the left with numbness, burning, sharp and shooting- He also has some pain in the left hip and groin where he had the incisions. Otherwise he is feeling well. He notes they removed his Foley yesterday and he is peeing well. Also notes his last bowel movement was in the last 36 hours.  ROS:  Pt denies SOB, abd pain, CP, N/V/C/D, and vision changes   Objective:   US SCROTUM W/DOPPLER  Result Date: 10/20/2020 CLINICAL DATA:  Left testicle swelling EXAM: SCROTAL ULTRASOUND DOPPLER ULTRASOUND OF THE TESTICLES TECHNIQUE: Complete ultrasound examination of the testicles, epididymis, and other scrotal structures was performed. Color and spectral Doppler ultrasound were also utilized to evaluate blood flow to the testicles. COMPARISON:  09/27/2020 FINDINGS: Right testicle Measurements: 3.5 x 1.4 x 2.5 cm. No mass or microlithiasis visualized. Left testicle Measurements: 3 x 1.6 x 1.8 cm. No mass or microlithiasis visualized. Marked scrotal wall thickening and edema without focal fluid collection. Right epididymis:  Normal in size and appearance. Left epididymis:  Not well seen Hydrocele:  Trace right hydrocele. Varicocele:  None visualized. Pulsed Doppler interrogation of both testes demonstrates normal low resistance arterial and venous waveforms bilaterally. IMPRESSION: 1. Negative for testicular torsion or mass. 2. Marked left scrotal wall thickening and edema without definitive focal fluid collection 3. Trace right hydrocele Electronically Signed   By: Donavan Foil M.D.   On: 10/20/2020 20:17   Recent Labs    10/21/20 1815  WBC 14.0*  HGB 9.2*  HCT 29.9*  PLT 363   No results for input(s): NA, K, CL, CO2, GLUCOSE, BUN, CREATININE, CALCIUM in the last 72 hours.  Intake/Output Summary (Last 24 hours) at 10/22/2020 1946 Last data filed at  10/22/2020 1800 Gross per 24 hour  Intake 460 ml  Output 1450 ml  Net -990 ml        Physical Exam: Vital Signs Blood pressure 116/88, pulse 98, temperature 98.4 F (36.9 C), temperature source Oral, resp. rate 18, height 5' 9"  (1.753 m), weight 82.1 kg, SpO2 96 %.     General: awake, alert, appropriate,  Lots of tattoos, on IV antibiotics ; supine in bed ;NAD HENT: conjugate gaze; oropharynx moist CV: regular rate; no JVD Pulmonary: CTA B/L; no W/R/R- good air movement GI: soft, NT, ND, (+)BS Psychiatric: appropriate; quiet Neurological: Ox3  GU:  Foley out, Left scrotum inurated but non tender  Skin: Warm and dry. Left thigh drain site with clear fluid on foam dressing  Musc: Bilateral lower extremity edema, no pain to palpation left ant and post thigh  Bilateral upper extremities: 5/5 proximal distal Right lower extremity: 4/5 proximal distally left lower extremity: Hip flexion, knee extension 2+/5 (pain inhibition)   Assessment/Plan: 1. Functional deficits which require 3+ hours per day of interdisciplinary therapy in a comprehensive inpatient rehab setting.  Physiatrist is providing close team supervision and 24 hour management of active medical problems listed below.  Physiatrist and rehab team continue to assess barriers to discharge/monitor patient progress toward functional and medical goals  Care Tool:  Bathing    Body parts bathed by  patient: Right arm,Left arm,Chest,Abdomen,Front perineal area,Buttocks,Right upper leg,Left upper leg,Right lower leg,Left lower leg,Face   Body parts bathed by helper: Front perineal area,Left upper leg Body parts n/a: Front perineal area,Buttocks,Left upper leg   Bathing assist Assist Level: Minimal Assistance - Patient > 75%     Upper Body Dressing/Undressing Upper body dressing   What is the patient wearing?: Hospital gown only    Upper body assist Assist Level: Set up assist    Lower Body Dressing/Undressing Lower  body dressing    Lower body dressing activity did not occur: Safety/medical concerns (Patient with multiple drains and wound vac) What is the patient wearing?: Hospital gown only     Lower body assist Assist for lower body dressing: Set up assist     [place] assist Assist for toileting: Dependent - Patient 0% (foley)     Transfers Chair/bed transfer  Transfers assist     Chair/bed transfer assist level: Minimal Assistance - Patient > 75% Chair/bed transfer assistive device: Walker,Armrests   Locomotion Ambulation   Ambulation assist      Assist level: Contact Guard/Touching assist Assistive device: Walker-rolling Max distance: 30   Walk 10 feet activity   Assist  Walk 10 feet activity did not occur: Safety/medical concerns  Assist level: Contact Guard/Touching assist Assistive device: Walker-rolling   Walk 50 feet activity   Assist Walk 50 feet with 2 turns activity did not occur: Safety/medical concerns  Assist level: Contact Guard/Touching assist      Walk 150 feet activity   Assist Walk 150 feet activity did not occur: Safety/medical concerns         Walk 10 feet on uneven surface  activity   Assist Walk 10 feet on uneven surfaces activity did not occur: Safety/medical concerns   Assist level: Contact Guard/Touching assist Assistive device: Aeronautical engineer Will patient use wheelchair at discharge?: Yes Type of Wheelchair: Manual    Wheelchair assist level: Supervision/Verbal cueing Max wheelchair distance: 150    Wheelchair 50 feet with 2 turns activity    Assist        Assist Level: Supervision/Verbal cueing   Wheelchair 150 feet activity     Assist      Assist Level: Supervision/Verbal cueing   Blood pressure 116/88, pulse 98, temperature 98.4 F (36.9 C), temperature source Oral, resp. rate 18, height 5' 9"  (1.753 m), weight 82.1 kg, SpO2 96 %.      Medical Problem List and Plan: 1.  Debility secondary to septic shock/left gluteal/groin abscesses/septic arthritis with history of avascular necrosis left hip  Continue CIR, tent d/c 4/8 which may change due to findings on CT   4/2-continue PT and OT  2.  Impaired mobility: -DVT/anticoagulation: Continue SCDs.  Venous Doppler studies negative             -antiplatelet therapy: N/A 3. Pain Management: Continue MS Contin 15 mg every 12 hours, MSIR 15 mg every 6 hours as needed-using regularly.   Controlled with meds, only using MSIR 14m one tab every other day  4/2-describes nerve pain-we will try adding gabapentin 300 mg twice a day 4. Depression: Continue Zoloft 50 mg daily.  Provide emotional support             -antipsychotic agents: N/A 5. Neuropsych: This patient is capable of making decisions on his own behalf. 6. Skin/Wound Care: Routine skin checks 7. Fluids/Electrolytes/Nutrition: Routine in and outs 8.  AVN left  hip.  Status post left total hip arthroplasty with antibiotic spacer left hip excisional debridement of skin subcutaneous tissue muscle bone deep abscess 09/15/2020 per Dr. Mardelle Matte.    Weightbearing as tolerated with posterior hip precautions 9.  Left groin and left hip abscess.  Status post multiple I&D's.  Wound VAC as directed-can likely be removed next week.- Gen Surg-Dr Cornett.  , drains removed with smaller fluid collections 10.  ID.  Continue ceftezole 2 g every 8 hours and Flagyl 500 mg every 8 hours and rifampin 300 mg every 12 hours through 10/28/2020 and stop.  Follow-up infectious disease Increased leukocytosis of 18 on 3/30 but remains afeb With increased fluid collection , thin sheets along the vastus int and semimembranosus, afebrile, non tender but WBC are up await ortho rec, may need image guided aspiration Will check CRP, ESR, ask ID to weigh in- clear fluid may be related to anasarca 11.  Hepatic cirrhosis with ascites.  Status post multiple paracentesis.   GI recommends outpatient EGD.  Latest paracentesis 10/12/2020.  Continue Chronulac 10 mg twice daily.   Will likely need repeat paracentesis next week  12.  Scrotal edema.  Follow-up urology services Dr. Alinda Money.  No concern for infection.  per Uro Dr Louis Meckel ok to remove foley and do voiding trial   4/2-is voiding normally with low postvoid residuals-continue voiding and monitor 13.  Anemia/thrombocytopenia.    Hemoglobin 8.5 on 3/24, continue to monitor 14.  Hypertension.  Lopressor 50 mg twice daily, Aldactone 100 mg daily, Lasix 40 mg daily  4/2-blood pressure controlled; continue regimen 15.  Tobacco abuse.  Counseling 16. Hyponatremia:   Sodium 129 on 3/25, 132 on 3/28, monitor trend, asymptomatic likely diuretic related  17. Hypoalbuminemia: 1.6, continue high protein foods, recommended girlfriend bring in nuts, Still 1.4, + proteinuria  LOS: 12 days A FACE TO FACE EVALUATION WAS PERFORMED  [name] 10/22/2020, 7:46 PM

## 2020-10-22 NOTE — Progress Notes (Signed)
All Dressings changed 0430.

## 2020-10-23 ENCOUNTER — Other Ambulatory Visit (HOSPITAL_COMMUNITY): Payer: Self-pay

## 2020-10-23 MED ORDER — ALBUMIN HUMAN 5 % IV SOLN
12.5000 g | INTRAVENOUS | Status: AC
  Administered 2020-10-23: 12.5 g via INTRAVENOUS
  Filled 2020-10-23: qty 250

## 2020-10-23 NOTE — Progress Notes (Signed)
PROGRESS NOTE   Subjective/Complaints:  Patient reports his stomach is more firm and getting more in the way with transfers.  Patient's nurse reports his ascites appears much worse today.  His weight has increased to 83.4 kg-from 82.1 kg yesterday. He said gabapentin has helped his pain a little bit -for his nerve pain.  He notes he is only half a pill twice and he already notices a difference.  ROS:   Pt denies SOB, CP, N/V/C/D, and vision changes  Objective:   No results found. Recent Labs    10/21/20 1815  WBC 14.0*  HGB 9.2*  HCT 29.9*  PLT 363   No results for input(s): NA, K, CL, CO2, GLUCOSE, BUN, CREATININE, CALCIUM in the last 72 hours.  Intake/Output Summary (Last 24 hours) at 10/23/2020 1834 Last data filed at 10/23/2020 1300 Gross per 24 hour  Intake 922.64 ml  Output 775 ml  Net 147.64 ml        Physical Exam: Vital Signs Blood pressure 109/79, pulse 93, temperature 98.2 F (36.8 C), temperature source Oral, resp. rate 18, height 5' 9"  (1.753 m), weight 83.4 kg, SpO2 100 %.      General: awake, alert, appropriate, sitting up in bed ; NT at bedside ; of note his abdomen appears more protuberant NAD HENT: conjugate gaze; oropharynx moist CV: regular rate; no JVD Pulmonary: CTA B/L; no W/R/R- good air movement GI:  NT, ND, (+)BS; abdomen appears more firm on palpation; -patient reports his stomach is not painful; however slightly uncomfortable with palpation Psychiatric: appropriate; slightly more interactive today Neurological: alert GU:  Foley out, Left scrotum inurated but non tender  Skin: Warm and dry. Left thigh drain site with clear fluid on foam dressing - no change Musc: Bilateral lower extremity edema, no pain to palpation left ant and post thigh  Bilateral upper extremities: 5/5 proximal distal Right lower extremity: 4/5 proximal distally left lower extremity: Hip flexion, knee extension  2+/5 (pain inhibition)   Assessment/Plan: 1. Functional deficits which require 3+ hours per day of interdisciplinary therapy in a comprehensive inpatient rehab setting.  Physiatrist is providing close team supervision and 24 hour management of active medical problems listed below.  Physiatrist and rehab team continue to assess barriers to discharge/monitor patient progress toward functional and medical goals  Care Tool:  Bathing    Body parts bathed by patient: Right arm,Left arm,Chest,Abdomen,Front perineal area,Buttocks,Right upper leg,Left upper leg,Right lower leg,Left lower leg,Face   Body parts bathed by helper: Front perineal area,Left upper leg Body parts n/a: Front perineal area,Buttocks,Left upper leg   Bathing assist Assist Level: Minimal Assistance - Patient > 75%     Upper Body Dressing/Undressing Upper body dressing   What is the patient wearing?: Hospital gown only    Upper body assist Assist Level: Set up assist    Lower Body Dressing/Undressing Lower body dressing    Lower body dressing activity did not occur: Safety/medical concerns (Patient with multiple drains and wound vac) What is the patient wearing?: Hospital gown only     Lower body assist Assist for lower body dressing: Set up assist     Toileting Toileting    Toileting  assist Assist for toileting: Dependent - Patient 0% (foley)     Transfers Chair/bed transfer  Transfers assist     Chair/bed transfer assist level: Minimal Assistance - Patient > 75% Chair/bed transfer assistive device: Walker,Armrests   Locomotion Ambulation   Ambulation assist      Assist level: Contact Guard/Touching assist Assistive device: Walker-rolling Max distance: 100   Walk 10 feet activity   Assist  Walk 10 feet activity did not occur: Safety/medical concerns  Assist level: Contact Guard/Touching assist Assistive device: Walker-rolling   Walk 50 feet activity   Assist Walk 50 feet with 2  turns activity did not occur: Safety/medical concerns  Assist level: Contact Guard/Touching assist Assistive device: Walker-rolling    Walk 150 feet activity   Assist Walk 150 feet activity did not occur: Safety/medical concerns         Walk 10 feet on uneven surface  activity   Assist Walk 10 feet on uneven surfaces activity did not occur: Safety/medical concerns   Assist level: Contact Guard/Touching assist Assistive device: Aeronautical engineer Will patient use wheelchair at discharge?: Yes Type of Wheelchair: Manual    Wheelchair assist level: Supervision/Verbal cueing Max wheelchair distance: 150    Wheelchair 50 feet with 2 turns activity    Assist        Assist Level: Supervision/Verbal cueing   Wheelchair 150 feet activity     Assist      Assist Level: Supervision/Verbal cueing   Blood pressure 109/79, pulse 93, temperature 98.2 F (36.8 C), temperature source Oral, resp. rate 18, height 5' 9"  (1.753 m), weight 83.4 kg, SpO2 100 %.     Medical Problem List and Plan: 1.  Debility secondary to septic shock/left gluteal/groin abscesses/septic arthritis with history of avascular necrosis left hip  Continue CIR, tent d/c 4/8 which may change due to findings on CT   Continue PT and OT  2.  Impaired mobility: -DVT/anticoagulation: Continue SCDs.  Venous Doppler studies negative             -antiplatelet therapy: N/A 3. Pain Management: Continue MS Contin 15 mg every 12 hours, MSIR 15 mg every 6 hours as needed-using regularly.   Controlled with meds, only using MSIR 63m one tab every other day  4/2-describes nerve pain-we will try adding gabapentin 300 mg twice a day  4/3-patient reports nerve pain is slightly better already; suggest titrating up if he can tolerate it 4. Depression: Continue Zoloft 50 mg daily.  Provide emotional support             -antipsychotic agents: N/A 5. Neuropsych: This patient is capable of  making decisions on his own behalf. 6. Skin/Wound Care: Routine skin checks 7. Fluids/Electrolytes/Nutrition: Routine in and outs 8.  AVN left hip.  Status post left total hip arthroplasty with antibiotic spacer left hip excisional debridement of skin subcutaneous tissue muscle bone deep abscess 09/15/2020 per Dr. LMardelle Matte    Weightbearing as tolerated with posterior hip precautions 9.  Left groin and left hip abscess.  Status post multiple I&D's.  Wound VAC as directed-can likely be removed next week.- Gen Surg-Dr Cornett.  , drains removed with smaller fluid collections 10.  ID.  Continue ceftezole 2 g every 8 hours and Flagyl 500 mg every 8 hours and rifampin 300 mg every 12 hours through 10/28/2020 and stop.  Follow-up infectious disease Increased leukocytosis of 18 on 3/30 but remains afeb With increased fluid collection , thin sheets  along the vastus int and semimembranosus, afebrile, non tender but WBC are up await ortho rec, may need image guided aspiration Will check CRP, ESR, ask ID to weigh in- clear fluid may be related to anasarca 11.  Hepatic cirrhosis with ascites.  Status post multiple paracentesis.  GI recommends outpatient EGD.  Latest paracentesis 10/12/2020.  Continue Chronulac 10 mg twice daily.   Will likely need repeat paracentesis next week   4/3-since patient's abdomen is larger today, I ordered a paracentesis for tomorrow-I also ordered albumin with it-please check that order if that is what you do or do not want. 12.  Scrotal edema.  Follow-up urology services Dr. Alinda Money.  No concern for infection.  per Uro Dr Louis Meckel ok to remove foley and do voiding trial   4/2-is voiding normally with low postvoid residuals-continue voiding and monitor 13.  Anemia/thrombocytopenia.    Hemoglobin 8.5 on 3/24, continue to monitor 14.  Hypertension.  Lopressor 50 mg twice daily, Aldactone 100 mg daily, Lasix 40 mg daily  4/2-blood pressure controlled; continue regimen 15.  Tobacco abuse.   Counseling 16. Hyponatremia:   Sodium 129 on 3/25, 132 on 3/28, monitor trend, asymptomatic likely diuretic related  17. Hypoalbuminemia: 1.6, continue high protein foods, recommended girlfriend bring in nuts, Still 1.4, + proteinuria  4/3-we will order albumin with his paracentesis  LOS: 13 days A FACE TO FACE EVALUATION WAS PERFORMED  [name] 10/23/2020, 6:34 PM

## 2020-10-23 NOTE — Progress Notes (Signed)
Physical Therapy Session Note  Patient Details  Name: [name] MRN: [id number] Date of Birth: [birth year]  Today's Date: 10/23/2020 PT Individual Time: 1100-1203 PT Individual Time Calculation (min): 63 min   Short Term Goals: Week 2:  PT Short Term Goal 1 (Week 2): Patient will complete bed mobility with CGA consistently PT Short Term Goal 2 (Week 2): Patient will complete sit <>stand with LRAD and MinA PT Short Term Goal 3 (Week 2): Patient will tolerate flexing L knee >25* in supine  Skilled Therapeutic Interventions/Progress Updates:  Chart reviewed. Pt received semi-reclined in bed and agreeable to participate in PT. Pt transferred to EOB with CGA for LLE management. From EOB, pt transferred to [facility] with MinA + RW. During transfer, pt reminded of hip precautions and was able to apply technique for safe mobility. Pt then managed WC propulsion for 115ft, including 1 turn, with supervision. In rehab gym, pt practiced multiple ambulation trials reaching 115ft with RW + CGA. Pt also initiated step practice at stairs. Pt used B rails and CGA assist for balance while practicing placement of RLE on/off step. Pt required up to MinA for balance during RLE placement on step. Pt then practiced L knee extension AROM with eccentric lowering for 2 x10. Pt returned to room practicing WC propulsion for 160ft with supervision.   Therapy Documentation Precautions:  Precautions Precautions: Fall,Posterior Hip,Other (comment) Precaution Comments: foley, wound vac L thigh, L JP drains L thigh Restrictions Weight Bearing Restrictions: No LLE Weight Bearing: Weight bearing as tolerated     Pain: Pain Assessment Pain Scale: 0-10 Pain Score: 5  Pain Type: Chronic pain;Surgical pain Pain Location: Hip Pain Orientation: Left Pain Descriptors / Indicators: Aching Pain Frequency: Constant Pain Onset: On-going Patients Stated Pain Goal: 1   Therapy/Group: Individual Therapy  Perrin Maltese, PT,  DPT 10/23/2020, 12:12 PM

## 2020-10-24 ENCOUNTER — Inpatient Hospital Stay (HOSPITAL_COMMUNITY): Payer: 59

## 2020-10-24 DIAGNOSIS — Z96642 Presence of left artificial hip joint: Secondary | ICD-10-CM

## 2020-10-24 HISTORY — PX: IR PARACENTESIS: IMG2679

## 2020-10-24 LAB — COMPREHENSIVE METABOLIC PANEL
ALT: 5 U/L (ref 0–44)
AST: 21 U/L (ref 15–41)
Albumin: 1.5 g/dL — ABNORMAL LOW (ref 3.5–5.0)
Alkaline Phosphatase: 135 U/L — ABNORMAL HIGH (ref 38–126)
Anion gap: 11 (ref 5–15)
BUN: <5 mg/dL — ABNORMAL LOW (ref 6–20)
CO2: 22 mmol/L (ref 22–32)
Calcium: 8 mg/dL — ABNORMAL LOW (ref 8.9–10.3)
Chloride: 102 mmol/L (ref 98–111)
Creatinine, Ser: <0.3 mg/dL — ABNORMAL LOW (ref 0.61–1.24)
Glucose, Bld: 90 mg/dL (ref 70–99)
Potassium: 3.3 mmol/L — ABNORMAL LOW (ref 3.5–5.1)
Sodium: 135 mmol/L (ref 135–145)
Total Bilirubin: 0.5 mg/dL (ref 0.3–1.2)
Total Protein: 5.1 g/dL — ABNORMAL LOW (ref 6.5–8.1)

## 2020-10-24 LAB — CBC WITH DIFFERENTIAL/PLATELET
Abs Immature Granulocytes: 0.05 10*3/uL (ref 0.00–0.07)
Basophils Absolute: 0 10*3/uL (ref 0.0–0.1)
Basophils Relative: 0 %
Eosinophils Absolute: 0.4 10*3/uL (ref 0.0–0.5)
Eosinophils Relative: 4 %
HCT: 27.3 % — ABNORMAL LOW (ref 39.0–52.0)
Hemoglobin: 8.6 g/dL — ABNORMAL LOW (ref 13.0–17.0)
Immature Granulocytes: 1 %
Lymphocytes Relative: 22 %
Lymphs Abs: 2.1 10*3/uL (ref 0.7–4.0)
MCH: 31.9 pg (ref 26.0–34.0)
MCHC: 31.5 g/dL (ref 30.0–36.0)
MCV: 101.1 fL — ABNORMAL HIGH (ref 80.0–100.0)
Monocytes Absolute: 1 10*3/uL (ref 0.1–1.0)
Monocytes Relative: 10 %
Neutro Abs: 6.2 10*3/uL (ref 1.7–7.7)
Neutrophils Relative %: 63 %
Platelets: 267 10*3/uL (ref 150–400)
RBC: 2.7 MIL/uL — ABNORMAL LOW (ref 4.22–5.81)
RDW: 15.4 % (ref 11.5–15.5)
WBC: 9.9 10*3/uL (ref 4.0–10.5)
nRBC: 0 % (ref 0.0–0.2)

## 2020-10-24 MED ORDER — LIDOCAINE HCL 1 % IJ SOLN
INTRAMUSCULAR | Status: AC
  Filled 2020-10-24: qty 20

## 2020-10-24 MED ORDER — POTASSIUM CHLORIDE CRYS ER 10 MEQ PO TBCR
10.0000 meq | EXTENDED_RELEASE_TABLET | Freq: Every day | ORAL | Status: DC
  Administered 2020-10-24 – 2020-10-28 (×5): 10 meq via ORAL
  Filled 2020-10-24 (×5): qty 1

## 2020-10-24 NOTE — Progress Notes (Signed)
PROGRESS NOTE   Subjective/Complaints:   ROS:   Pt denies SOB, CP, N/V/C/D, and vision changes  Objective:   No results found. Recent Labs    10/21/20 1815 10/24/20 0510  WBC 14.0* 9.9  HGB 9.2* 8.6*  HCT 29.9* 27.3*  PLT 363 267   Recent Labs    10/24/20 0510  NA 135  K 3.3*  CL 102  CO2 22  GLUCOSE 90  BUN <5*  CREATININE <0.30*  CALCIUM 8.0*    Intake/Output Summary (Last 24 hours) at 10/24/2020 0929 Last data filed at 10/24/2020 0700 Gross per 24 hour  Intake 820 ml  Output 375 ml  Net 445 ml        Physical Exam: Vital Signs Blood pressure (!) 130/91, pulse (!) 106, temperature 98.4 F (36.9 C), temperature source Oral, resp. rate 18, height 5' 9" (1.753 m), weight 83.5 kg, SpO2 95 %.      General: awake, alert, appropriate, sitting up in bed ; NT at bedside ; of note his abdomen appears more protuberant NAD HENT: conjugate gaze; oropharynx moist CV: regular rate; no JVD Pulmonary: CTA B/L; no W/R/R- good air movement GI:  NT, ND, (+)BS; abdomen appears more firm on palpation; -patient reports his stomach is not painful; however slightly uncomfortable with palpation Psychiatric: appropriate; slightly more interactive today Neurological: alert GU:  Foley out, Left scrotum inurated but non tender  Skin: Warm and dry. Left thigh drain site with clear fluid on foam dressing - no change Musc: Bilateral lower extremity edema, no pain to palpation left ant and post thigh  Bilateral upper extremities: 5/5 proximal distal Right lower extremity: 4/5 proximal distally left lower extremity: Hip flexion, knee extension 2+/5 (pain inhibition)   Assessment/Plan: 1. Functional deficits which require 3+ hours per day of interdisciplinary therapy in a comprehensive inpatient rehab setting.  Physiatrist is providing close team supervision and 24 hour management of active medical problems listed  below.  Physiatrist and rehab team continue to assess barriers to discharge/monitor patient progress toward functional and medical goals  Care Tool:  Bathing    Body parts bathed by patient: Right arm,Left arm,Chest,Abdomen,Front perineal area,Buttocks,Right upper leg,Left upper leg,Right lower leg,Left lower leg,Face   Body parts bathed by helper: Front perineal area,Left upper leg Body parts n/a: Front perineal area,Buttocks,Left upper leg   Bathing assist Assist Level: Minimal Assistance - Patient > 75%     Upper Body Dressing/Undressing Upper body dressing   What is the patient wearing?: Hospital gown only    Upper body assist Assist Level: Set up assist    Lower Body Dressing/Undressing Lower body dressing    Lower body dressing activity did not occur: Safety/medical concerns (Patient with multiple drains and wound vac) What is the patient wearing?: Hospital gown only     Lower body assist Assist for lower body dressing: Set up assist     Toileting Toileting    Toileting assist Assist for toileting: Dependent - Patient 0% (foley)     Transfers Chair/bed transfer  Transfers assist     Chair/bed transfer assist level: Minimal Assistance - Patient > 75% Chair/bed transfer assistive device: Armed forces training and education officer  Ambulation assist      Assist level: Contact Guard/Touching assist Assistive device: Walker-rolling Max distance: 100   Walk 10 feet activity   Assist  Walk 10 feet activity did not occur: Safety/medical concerns  Assist level: Contact Guard/Touching assist Assistive device: Walker-rolling   Walk 50 feet activity   Assist Walk 50 feet with 2 turns activity did not occur: Safety/medical concerns  Assist level: Contact Guard/Touching assist Assistive device: Walker-rolling    Walk 150 feet activity   Assist Walk 150 feet activity did not occur: Safety/medical concerns         Walk 10 feet on uneven  surface  activity   Assist Walk 10 feet on uneven surfaces activity did not occur: Safety/medical concerns   Assist level: Contact Guard/Touching assist Assistive device: Aeronautical engineer Will patient use wheelchair at discharge?: Yes Type of Wheelchair: Manual    Wheelchair assist level: Supervision/Verbal cueing Max wheelchair distance: 150    Wheelchair 50 feet with 2 turns activity    Assist        Assist Level: Supervision/Verbal cueing   Wheelchair 150 feet activity     Assist      Assist Level: Supervision/Verbal cueing   Blood pressure (!) 130/91, pulse (!) 106, temperature 98.4 F (36.9 C), temperature source Oral, resp. rate 18, height 5' 9" (1.753 m), weight 83.5 kg, SpO2 95 %.     Medical Problem List and Plan: 1.  Debility secondary to septic shock/left gluteal/groin abscesses/septic arthritis with history of avascular necrosis left hip  Continue CIR, tent d/c 4/8 still looks appropriate no drainage rec per IR, no change in abx Voiding well post foley removal   Continue PT and OT  2.  Impaired mobility: -DVT/anticoagulation: Continue SCDs.  Venous Doppler studies negative             -antiplatelet therapy: N/A 3. Pain Management: Continue MS Contin 15 mg every 12 hours, MSIR 15 mg every 6 hours as needed-using regularly.   Controlled with meds, d/c MS Contin, cont Oxy IR 48m q6h prn  4/2-describes nerve pain-we will try adding gabapentin 300 mg twice a day  4/3-patient reports nerve pain is slightly better already; suggest titrating up if he can tolerate it 4. Depression: Continue Zoloft 50 mg daily.  Provide emotional support             -antipsychotic agents: N/A 5. Neuropsych: This patient is capable of making decisions on his own behalf. 6. Skin/Wound Care: Routine skin checks 7. Fluids/Electrolytes/Nutrition: Routine in and outs 8.  AVN left hip.  Status post left total hip arthroplasty with antibiotic spacer  left hip excisional debridement of skin subcutaneous tissue muscle bone deep abscess 09/15/2020 per Dr. LMardelle Matte    Weightbearing as tolerated with posterior hip precautions 9.  Left groin and left hip abscess.  Status post multiple I&D's.  Wound VAC as directed-can likely be removed next week.- Gen Surg-Dr Cornett.  , drains removed with smaller fluid collections 10.  ID.  Continue ceftezole 2 g every 8 hours and Flagyl 500 mg every 8 hours and rifampin 300 mg every 12 hours through 10/28/2020 and stop.  Follow-up infectious disease Increased leukocytosis of 18 on 3/30 but remains afeb With increased fluid collection , thin sheets along the vastus int and semimembranosus, afebrile, non tender but WBC are up await ortho rec, may need image guided aspiration Will check CRP, ESR, ask ID to weigh in- clear fluid may  be related to anasarca 11.  Hepatic cirrhosis with ascites.  Status post multiple paracentesis.  GI recommends outpatient EGD.  Latest paracentesis 10/12/2020.  Continue Chronulac 10 mg twice daily.   Will likely need repeat paracentesis next week   4/3-since patient's abdomen is larger today, I ordered a paracentesis for tomorrow-I also ordered albumin with it-please check that order if that is what you do or do not want. 12.  Scrotal edema.  Follow-up urology services Dr. Alinda Money.  No concern for infection.  per Uro Dr Louis Meckel ok to remove foley and do voiding trial   4/2-is voiding normally with low postvoid residuals-continue voiding and monitor 13.  Anemia/thrombocytopenia.    Hemoglobin 8.5 on 3/24, continue to monitor 14.  Hypertension.  Lopressor 50 mg twice daily, Aldactone 100 mg daily, Lasix 40 mg daily  4/2-blood pressure controlled; continue regimen 15.  Tobacco abuse.  Counseling 16. Hyponatremia:   Sodium 129 on 3/25, 132 on 3/28, monitor trend, asymptomatic likely diuretic related  17. Hypoalbuminemia: 1.6, continue high protein foods, recommended girlfriend bring in nuts, Still  1.4, + proteinuria  4/3-we will order albumin with his paracentesis 18.  Hypo K+ related to diuretic will give one time dose of K+ given spironolactone but may require a daily low dose  LOS: 14 days A FACE TO FACE EVALUATION WAS PERFORMED  Charlett Blake 10/24/2020, 9:29 AM

## 2020-10-24 NOTE — Progress Notes (Signed)
Occupational Therapy Session Note  Patient Details  Name: [name] MRN: [id number] Date of Birth: 1986-02-14  Today's Date: 10/24/2020 OT Individual Time: 3976-7341 OT Individual Time Calculation (min): 57 min    Short Term Goals: Week 2:  OT Short Term Goal 1 (Week 2): Continue working on established LTGs set a min guard to supervision.  Skilled Therapeutic Interventions/Progress Updates:    Pt worked on bathing and dressing during the session.  He was able to transfer to the wheelchair with min assist from supine position to sitting and then to the wheelchair.  He was able to complete all UB selfcare with supervision and then completed LB selfcare with min assist using a LH sponge and reacher for washing and drying his lower legs.  Reacher was utilized for removing the gripper socks with supervision prior to bathing.  He needs total assist for donning gripper socks as sockaide was not available.  Finished session with transfer back to the bed with min assist.  Mod facilitation needed for transfer supine.  Call button and phone in reach with safety alarm in place.    Therapy Documentation Precautions:  Precautions Precautions: Fall,Posterior Hip,Other (comment) Precaution Comments: foley, wound vac L thigh, L JP drains L thigh Restrictions Weight Bearing Restrictions: No LLE Weight Bearing: Weight bearing as tolerated Other Position/Activity Restrictions: 3 JP drains in Lt anterior thigh and wound vac Pain: Pain Assessment Pain Scale: Faces Faces Pain Scale: Hurts little more Pain Type: Surgical pain Pain Location: Hip Pain Orientation: Left Pain Descriptors / Indicators: Discomfort Pain Onset: With Activity Pain Intervention(s): Repositioned ADL: See Care Tool Section for some details of mobility and selfcare  Therapy/Group: Individual Therapy  Farhana Fellows OTR/L 10/24/2020, 3:53 PM

## 2020-10-24 NOTE — Procedures (Addendum)
Ultrasound-guided  therapeutic paracentesis performed yielding 3.8 liters of yellow fluid. No immediate complications. The pt will receive IV albumin by floor nurse postprocedure. EBL none.

## 2020-10-24 NOTE — Progress Notes (Addendum)
Subjective:    Continues to have nerve pain in his left toes though states gabapentin is improving. Denies pain in his left buttock or posterior left leg. Denies CP, SOB, paraesthesias.   Objective:  PE: VITALS:   Vitals:   10/23/20 0513 10/23/20 1440 10/23/20 2008 10/24/20 0404  BP: 113/77 109/79 120/85 (!) 130/91  Pulse: 96 93 (!) 110 (!) 106  Resp: 16 18 18 18   Temp: 98.2 F (36.8 C) 98.2 F (36.8 C) 99.5 F (37.5 C) 98.4 F (36.9 C)  TempSrc:  Oral Oral Oral  SpO2: 98% 100% 94% 95%  Weight: 83.4 kg   83.5 kg  Height:       General: laying in bed in no acute distress MSK: Left hip incision continuing to drain serous fluid. Left anterior thigh incision improved, with no significant erythema or drainage. No TTP to left knee. Dorsiflexion and plantarflexion intact. Edema at dorsal aspect of left foot. Feet warm and well perfused. Distal sensation intact. Continued erythema of right groin where scrotum lays against medial right thigh. No drainage or fluctuance.    LABS  Results for orders placed or performed during the hospital encounter of 10/10/20 (from the past 24 hour(s))  Comprehensive metabolic panel     Status: Abnormal   Collection Time: 10/24/20  5:10 AM  Result Value Ref Range   Sodium 135 135 - 145 mmol/L   Potassium 3.3 (L) 3.5 - 5.1 mmol/L   Chloride 102 98 - 111 mmol/L   CO2 22 22 - 32 mmol/L   Glucose, Bld 90 70 - 99 mg/dL   BUN <5 (L) 6 - 20 mg/dL   Creatinine, Ser 12/24/20 (L) 0.61 - 1.24 mg/dL   Calcium 8.0 (L) 8.9 - 10.3 mg/dL   Total Protein 5.1 (L) 6.5 - 8.1 g/dL   Albumin 1.5 (L) 3.5 - 5.0 g/dL   AST 21 15 - 41 U/L   ALT 5 0 - 44 U/L   Alkaline Phosphatase 135 (H) 38 - 126 U/L   Total Bilirubin 0.5 0.3 - 1.2 mg/dL   GFR, Estimated NOT CALCULATED >60 mL/min   Anion gap 11 5 - 15  CBC with Differential/Platelet     Status: Abnormal   Collection Time: 10/24/20  5:10 AM  Result Value Ref Range   WBC 9.9 4.0 - 10.5 K/uL   RBC 2.70 (L) 4.22 -  5.81 MIL/uL   Hemoglobin 8.6 (L) 13.0 - 17.0 g/dL   HCT 12/24/20 (L) 30.8 - 65.7 %   MCV 101.1 (H) 80.0 - 100.0 fL   MCH 31.9 26.0 - 34.0 pg   MCHC 31.5 30.0 - 36.0 g/dL   RDW 84.6 96.2 - 95.2 %   Platelets 267 150 - 400 K/uL   nRBC 0.0 0.0 - 0.2 %   Neutrophils Relative % 63 %   Neutro Abs 6.2 1.7 - 7.7 K/uL   Lymphocytes Relative 22 %   Lymphs Abs 2.1 0.7 - 4.0 K/uL   Monocytes Relative 10 %   Monocytes Absolute 1.0 0.1 - 1.0 K/uL   Eosinophils Relative 4 %   Eosinophils Absolute 0.4 0.0 - 0.5 K/uL   Basophils Relative 0 %   Basophils Absolute 0.0 0.0 - 0.1 K/uL   Immature Granulocytes 1 %   Abs Immature Granulocytes 0.05 0.00 - 0.07 K/uL    No results found.  Assessment/Plan: Lefthipseptic arthritis/AVN,multiple left thigh and retroperitonealabscesses - 2/23 - placement of drain into left retroperitoneal abscess and two  into left thigh by IR - 2/24 -left hip total arthroplasty with cement interposition spacer - 3/1 - left thigh irrigation and debridement with wound vac placementby orthopedics, removal of left retroperitoneal drain - 3/3 - I&D of left groin abscess with change of wound vac by general surgery 3/4 - CT guided drainage of left thigh fluid collection by IR - 3/8 - left thigh repeat irrigation and debridement by orthopedics with partial closure of wound, incision and drainage of left thigh abscess/left groin abscessby general surgery 3/11 - Removal ofleftpsoas drain, CT guided aspiration ofleftiliacus abscess, CT guided drain placement of left anterior thigh abscess by IR - all drains and wound vacs have been removed  - repeat CT scan shows subcutaneous and intramuscular abscesses in the left thigh with diffuse subcutaneous edema in the thigh - WBC trending down, 9.9 this morning - Dr. Dion Saucier and I have contacted Dr. Archer Asa with IR and Dr. Lajoyce Corners who agree that fluid collections seen on most recent CT are too small for drain insertion and do no think that  aspirating each individual small fluid collection would provide clinical benefit, no surgical plans at this time - recommend continuing with BID dressing changes at left hip and left thigh  Contact information:   Weekdays 8-5 Janine Ores, PA-C [phone] A fter hours and holidays please check [email or web address] for group call information for [facility]  Armida Sans 10/24/2020, 8:07 AM

## 2020-10-24 NOTE — Progress Notes (Signed)
Physical Therapy Session Note  Patient Details  Name: [name] MRN: [id number] Date of Birth: 11/06/1985  Today's Date: 10/24/2020 PT Individual Time: 1015-1100; 0800-0900 PT Individual Time Calculation (min): 45 min and 60 mins  Short Term Goals: Week 2:  PT Short Term Goal 1 (Week 2): Patient will complete bed mobility with CGA consistently PT Short Term Goal 2 (Week 2): Patient will complete sit <>stand with LRAD and MinA PT Short Term Goal 3 (Week 2): Patient will tolerate flexing L knee >25* in supine  Skilled Therapeutic Interventions/Progress Updates:    Session 1: Patient received sitting up in bed, agreeable to PT. He reports "moderate" pain in L hip/thigh, but did not rate- premedicated. PT providing rest breaks, distractions and repositioning to assist with pain management. He was able to come sit edge of bed with supervision "hooking" L LE to assist it down. Light CGA from elevated bed height and CGA/supervision assist to ambulate to bathroom with RW. He was continent of bowel and required TotalA for perihygiene in standing. He completed morning ADLs seated at sink independently. Patient propelling himself in wc x163ft with supervision and assist for managing IV pole. Kinetron 3' x4 with progressive resistance- emphasis on L LE relative extension. Patient completing LAQ 3x15 with 2# ankle weight on L LE. Slightly diminished ROM noted compared to no ankle weight, but not significant. Patient returning to room in wc, call light within reach.    Session 2: Patient received sitting up in wc, agreeable to PT. He reports 5/10 pain in L hip/thigh, premedicated. PT providing rest breaks, distractions and repositioning to assist with pain management. PT transporting patient in wc to therapy gym for time management and energy conservation. He was able to ambulate 17ft, 110ft with RW and CGA/supervision. Patient reporting tolerating weight bearing on L LE >50% now, which is a significant  improvement. No significant increase in pain noted either. Patient completing supine L LE heel slides to ~10-15* L knee flexion. Compensatory slight ER at the hip noted, but patient able to self correct. Patient returning to room in wc, remaining in wc, call light within reach.   Therapy Documentation Precautions:  Precautions Precautions: Fall,Posterior Hip,Other (comment) Precaution Comments: foley, wound vac L thigh, L JP drains L thigh Restrictions Weight Bearing Restrictions: No LLE Weight Bearing: Weight bearing as tolerated Other Position/Activity Restrictions: 3 JP drains in Lt anterior thigh and wound vac    Therapy/Group: Individual Therapy  Elizebeth Koller, PT, DPT, CBIS  10/24/2020, 7:46 AM

## 2020-10-24 NOTE — Progress Notes (Signed)
Patient ID: [name], male   DOB: [birth year], 35 y.o.   MRN: [id number]   Pt HH referral sent to [facility] for review  Lavera Guise, [place] [phone]

## 2020-10-24 NOTE — Consult Note (Signed)
Neuropsychological Consultation   Patient:   [name]   DOB:   1985/09/23  MR Number:  [id number]  Location:  [facility] [address] CENTER A [id number] [place] [address] Dept: [phone] Loc: [phone]           Date of Service:   10/24/2020  Start Time:   9 AM End Time:   10 AM  Provider/Observer:  Arley Phenix, Psy.D.       Clinical Neuropsychologist       Billing Code/Service: 62831  Chief Complaint:    [name] is a 35 year old right-handed male with history of significant AVN of hip and had scheduled plan for arthroplasty March 2022.  Patient has issues with chronic pain, hypertension, alcoholic/cirrhosis as well as tobacco use and depression.  Patient also had a recent admission for Covid, acute renal failure and alcoholic hepatitis on 07/25/2020 through 08/29/2020.  Patient presented on 09/13/2020 with increased fatigue that progressed over the prior 2 weeks.  Patient also had an increasing left thigh and leg pain.  Patient found to have staph infection with abscesses with diffuse swelling and left hip effusion.  Patient started on broad-spectrum antibiotics.  Patient underwent left total hip arthroplasty with antibiotic spacer left hip excision debridement of skin subcutaneous tissue muscle deep bone abscess 09/15/2020 per Dr. Dion Saucier.  Infectious disease was also consulted and patient remains on antibiotic therapy.  Patient admitted to CIR due to functional decline and significant mobility issues.  Reason for Service:  Patient was referred for neuropsychological consultation due to coping and adjustment issues with history of depression and significant medical issues including cirrhosis, significant infectious process with recent total left hip replacement.  Below see HPI for the current admission.  HPI: [name] is a 35 year old right-handed male with history significant of AVN  of hip and had a scheduled plan for arthroplasty March 2022, chronic pain, hypertension, alcoholic hepatitis/cirrhosis with ascites with  as well as tobacco use and depression.  Recent admission for Covid, acute renal failure and alcoholic hepatitis 07/25/2020 08/29/2020.  Per chart review patient lives with spouse.  1 level home 2 steps to entry.  Independent with assistive device.  Presented 09/13/2020 with increased fatigue that progressed over 2-week.  As well as increasing left thigh and leg pain.  He did report he had a blister rupture on the inner thigh in the groin area.  Admission chemistry sodium 128, glucose 112, creatinine 0.59, lactic acid 3.3.  Hemoglobin 6.2 with latest hemoglobin 9.4 08/29/2020, blood culture Staph aureus.  FOBT negative.  CT imaging showed left iliopsoas and left gluteal abscesses with diffuse swelling and left hip effusion.  Patient started on broad-spectrum antibiotics.  Patient underwent left total hip arthroplasty with antibiotic spacer left hip excisional debridement of skin subcutaneous tissue muscle bone deep abscess 09/15/2020 per Dr. Dion Saucier.  Infectious disease consulted as patient remained on antibiotic therapy TEE completed showing no PFO without masses or vegetation noted.  He later underwent left thigh excisional debridement of skin muscle fascia deep tissue abscess repositioning of percutaneous anterior pigtail catheter drain application of wound VAC 09/20/2020.  Hospital course complicated by left groin abscess with I&D of left groin abscess and change of wound VAC to left thigh 09/22/2020 per general surgery Dr. Luisa Hart followed by repeat I&D of left groin abscess and left thigh abscess 09/27/2020.Marland Kitchen  Lower extremity Dopplers completed 09/28/2020 showing no DVT.  Underwent ultrasound-guided diagnostic and therapeutic  paracentesis 09/30/2020 as well as 10/05/2020 per interventional radiology yielding 3 L of clear light yellow fluid.  He did have some initial difficulties in voiding  Foley catheter tube inserted ultrasound completed concerning for soft tissue infection urology follow-up Dr. Laverle Patter evaluation appeared to be related to more regional edema from his leg and ankle infection.  There was no abscess noted on recent ultrasound.  Patient would remain on cefazolin, Flagyl and rifampin through 10/28/2020 per infectious disease.  Therapy evaluations completed due to patient decreased functional mobility was admitted for a comprehensive rehab program.  Current Status:  Patient was awake and oriented sitting up in his recliner chair in the upright position.  Patient reports that while he has had feelings of frustration and feeling "blah" during hospital course his depression has not been exacerbated and he has been able to actively engage in therapeutic efforts.  Patient acknowledged his history of significant alcohol use/abuse and is aware that further alcohol use with his cirrhosis is life-threatening.  Patient reports that he does not plan on returning to drinking but he was drinking as little as a few months ago.  Patient reports that his pain has been improving and he has been working on therapeutic efforts and is looking forward to going home on 10/28/2020.  Behavioral Observation: [name]  presents as a 35 y.o.-year-old Right Caucasian Male who appeared his stated age. his dress was Appropriate and he was Well Groomed and his manners were Appropriate to the situation.  his participation was indicative of Appropriate and Attentive behaviors.  There were physical disabilities noted.  he displayed an appropriate level of cooperation and motivation.     Interactions:    Active Appropriate  Attention:   within normal limits and attention span and concentration were age appropriate  Memory:   within normal limits; recent and remote memory intact  Visuo-spatial:  not examined  Speech (Volume):  normal  Speech:   normal; normal  Thought Process:  Coherent and  Relevant  Though Content:  WNL; not suicidal and not homicidal  Orientation:   person, place, time/date and situation  Judgment:   Good  Planning:   Fair  Affect:    Flat  Mood:    Dysphoric  Insight:   Fair  Intelligence:   normal  Substance Use:  There is a documented history of alcohol abuse confirmed by the patient.    Medical History:   Past Medical History:  Diagnosis Date  . Alcohol abuse   . Asthma   . Depression   . Hypertension          Patient Active Problem List   Diagnosis Date Noted  . S/P hip replacement, left   . Ascites   . Hyponatremia   . Essential hypertension   . AVN (avascular necrosis of bone) (HCC)   . Abscess of hip, left   . Septic shock (HCC) 10/10/2020  . Debility 10/10/2020  . Ascites due to alcoholic hepatitis   . Portal hypertension (HCC)   . Alcoholic cirrhosis of liver without ascites (HCC)   . Septic arthritis (HCC) 09/14/2020  . MSSA bacteremia 09/14/2020  . 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 Sepsis (HCC) 09/13/2020  . ARF (acute renal failure) (HCC) 08/22/2020  . Thrombocytopenia (HCC) 08/22/2020  . Alcoholic hepatitis 08/22/2020  . Hypertension 08/22/2020  . COVID-19 virus infection 08/22/2020  . Alcohol abuse   . MDD (major depressive disorder) 10/15/2013  . Alcohol dependence (HCC) 04/02/2013  .  Social anxiety disorder 04/02/2013  . ALLERGIC RHINITIS 03/28/2007    Psychiatric History:  Patient has a history of significant depression as well as significant alcohol abuse with alcoholic hepatitis and cirrhosis.  Family Med/Psych History:  Family History  Problem Relation Age of Onset  . Cirrhosis Neg Hx   . Diabetes Mellitus II Neg Hx     Risk of Suicide/Violence: low patient denies any suicidal or homicidal ideation although he has had extended periods of alcohol abuse.  Impression/DX:  [name] is a 35 year old right-handed male with history of significant AVN of hip and had scheduled plan  for arthroplasty March 2022.  Patient has issues with chronic pain, hypertension, alcoholic/cirrhosis as well as tobacco use and depression.  Patient also had a recent admission for Covid, acute renal failure and alcoholic hepatitis on 07/25/2020 through 08/29/2020.  Patient presented on 09/13/2020 with increased fatigue that progressed over the prior 2 weeks.  Patient also had an increasing left thigh and leg pain.  Patient found to have staph infection with abscesses with diffuse swelling and left hip effusion.  Patient started on broad-spectrum antibiotics.  Patient underwent left total hip arthroplasty with antibiotic spacer left hip excision debridement of skin subcutaneous tissue muscle deep bone abscess 09/15/2020 per Dr. Dion Saucier.  Infectious disease was also consulted and patient remains on antibiotic therapy.  Patient admitted to CIR due to functional decline and significant mobility issues.  Patient was awake and oriented sitting up in his recliner chair in the upright position.  Patient reports that while he has had feelings of frustration and feeling "blah" during hospital course his depression has not been exacerbated and he has been able to actively engage in therapeutic efforts.  Patient acknowledged his history of significant alcohol use/abuse and is aware that further alcohol use with his cirrhosis is life-threatening.  Patient reports that he does not plan on returning to drinking but he was drinking as little as a few months ago.  Patient reports that his pain has been improving and he has been working on therapeutic efforts and is looking forward to going home on 10/28/2020.   Disposition/Plan:  Today we worked on coping and adjustment issues.  Patient denies severe depression.  Patient continues with his sertraline.  Diagnosis:    Swelling - Plan: CANCELED: US Abdomen Complete, CANCELED: US Abdomen Complete  Ascites - Plan: US Abdomen Limited, US Abdomen Limited, US Abdomen Limited, US Abdomen  Limited  S/P hip replacement, left - Plan: DG HIP UNILAT WITH PELVIS 2-3 VIEWS LEFT, DG HIP UNILAT WITH PELVIS 2-3 VIEWS LEFT, CANCELED: DG HIP PORT UNILAT WITH PELVIS 1V LEFT, CANCELED: DG HIP PORT UNILAT WITH PELVIS 1V LEFT  Testicular swelling, left - Plan: US SCROTUM W/DOPPLER, US SCROTUM W/DOPPLER  Ascites due to alcoholic cirrhosis (HCC) - Plan: IR Paracentesis, IR Paracentesis, CANCELED: US Paracentesis, CANCELED: US Paracentesis         Electronically Signed   _______________________ Arley Phenix, Psy.D. Clinical Neuropsychologist

## 2020-10-25 NOTE — Progress Notes (Signed)
PROGRESS NOTE   Subjective/Complaints: TIngling pain Left Knee and foot, pain with motion Left hip area  Almost 4L paracentesis yesterday  ROS:   Pt denies SOB, CP, N/V/C/D, and vision changes  Objective:   IR Paracentesis  Result Date: 10/24/2020 INDICATION: Patient with history of septic arthritis, staph bacteremia, alcoholic cirrhosis, recurrent ascites. Request received for therapeutic paracentesis. EXAM: ULTRASOUND GUIDED THERAPEUTIC PARACENTESIS MEDICATIONS: 1% lidocaine to skin and subcutaneous tissue COMPLICATIONS: None immediate. PROCEDURE: Informed written consent was obtained from the patient after a discussion of the risks, benefits and alternatives to treatment. A timeout was performed prior to the initiation of the procedure. Initial ultrasound scanning demonstrates a moderate amount of ascites within the right lower abdominal quadrant. The right lower abdomen was prepped and draped in the usual sterile fashion. 1% lidocaine was used for local anesthesia. Following this, a 19 gauge, 7-cm, Yueh catheter was introduced. An ultrasound image was saved for documentation purposes. The paracentesis was performed. The catheter was removed and a dressing was applied. The patient tolerated the procedure well without immediate post procedural complication. Patient received post-procedure intravenous albumin; see nursing notes for details. FINDINGS: A total of approximately 3.8 liters of yellow fluid was removed. IMPRESSION: Successful ultrasound-guided therapeutic paracentesis yielding 3.8 liters of peritoneal fluid. Read by: Rowe Robert, PA-C Electronically Signed   By: Sandi Mariscal M.D.   On: 10/24/2020 16:27   Recent Labs    10/24/20 0510  WBC 9.9  HGB 8.6*  HCT 27.3*  PLT 267   Recent Labs    10/24/20 0510  NA 135  K 3.3*  CL 102  CO2 22  GLUCOSE 90  BUN <5*  CREATININE <0.30*  CALCIUM 8.0*    Intake/Output Summary  (Last 24 hours) at 10/25/2020 0819 Last data filed at 10/25/2020 0540 Gross per 24 hour  Intake --  Output 5325 ml  Net -5325 ml        Physical Exam: Vital Signs Blood pressure 114/80, pulse 96, temperature 98.7 F (37.1 C), temperature source Oral, resp. rate 18, height 5' 9"  (1.753 m), weight 80.8 kg, SpO2 96 %.  General: No acute distress Mood and affect are appropriate Heart: Regular rate and rhythm no rubs murmurs or extra sounds Lungs: Clear to auscultation, breathing unlabored, no rales or wheezes Abdomen: Positive bowel sounds, soft nontender to palpation, nondistended Extremities: No clubbing, cyanosis, or edema Skin: Warm and dry. Left thigh drain site with clear fluid on foam dressing - no change Musc: Bilateral lower extremity edema, no pain to palpation left ant and post thigh  Bilateral upper extremities: 5/5 proximal distal Right lower extremity: 4/5 proximal distally left lower extremity: Hip flexion, knee extension 2+/5 (pain inhibition)   Assessment/Plan: 1. Functional deficits which require 3+ hours per day of interdisciplinary therapy in a comprehensive inpatient rehab setting.  Physiatrist is providing close team supervision and 24 hour management of active medical problems listed below.  Physiatrist and rehab team continue to assess barriers to discharge/monitor patient progress toward functional and medical goals  Care Tool:  Bathing    Body parts bathed by patient: Right arm,Left arm,Chest,Abdomen,Right upper leg,Left upper leg,Right lower leg,Left lower leg,Face,Front perineal area,Buttocks  Body parts bathed by helper: Front perineal area,Left upper leg Body parts n/a: Front perineal area,Buttocks,Left upper leg   Bathing assist Assist Level: Minimal Assistance - Patient > 75%     Upper Body Dressing/Undressing Upper body dressing   What is the patient wearing?: Hospital gown only    Upper body assist Assist Level: Set up assist    Lower Body  Dressing/Undressing Lower body dressing    Lower body dressing activity did not occur: Safety/medical concerns (Patient with multiple drains and wound vac) What is the patient wearing?: Pants     Lower body assist Assist for lower body dressing: Minimal Assistance - Patient > 75%     Toileting Toileting    Toileting assist Assist for toileting: Dependent - Patient 0% (foley)     Transfers Chair/bed transfer  Transfers assist     Chair/bed transfer assist level: Minimal Assistance - Patient > 75% Chair/bed transfer assistive device: Walker,Armrests   Locomotion Ambulation   Ambulation assist      Assist level: Contact Guard/Touching assist Assistive device: Walker-rolling Max distance: 90   Walk 10 feet activity   Assist  Walk 10 feet activity did not occur: Safety/medical concerns  Assist level: Contact Guard/Touching assist Assistive device: Walker-rolling   Walk 50 feet activity   Assist Walk 50 feet with 2 turns activity did not occur: Safety/medical concerns  Assist level: Contact Guard/Touching assist Assistive device: Walker-rolling    Walk 150 feet activity   Assist Walk 150 feet activity did not occur: Safety/medical concerns  Assist level: Contact Guard/Touching assist Assistive device: Walker-rolling    Walk 10 feet on uneven surface  activity   Assist Walk 10 feet on uneven surfaces activity did not occur: Safety/medical concerns   Assist level: Contact Guard/Touching assist Assistive device: Aeronautical engineer Will patient use wheelchair at discharge?: Yes Type of Wheelchair: Manual    Wheelchair assist level: Supervision/Verbal cueing Max wheelchair distance: 150    Wheelchair 50 feet with 2 turns activity    Assist        Assist Level: Supervision/Verbal cueing   Wheelchair 150 feet activity     Assist      Assist Level: Supervision/Verbal cueing   Blood pressure 114/80,  pulse 96, temperature 98.7 F (37.1 C), temperature source Oral, resp. rate 18, height 5' 9"  (1.753 m), weight 80.8 kg, SpO2 96 %.     Medical Problem List and Plan: 1.  Debility secondary to septic shock/left gluteal/groin abscesses/septic arthritis with history of avascular necrosis left hip  Continue CIR, tent d/c 4/8 still looks appropriate no drainage rec per IR, no change in abx Voiding well post foley removal   Continue PT and OT  2.  Impaired mobility: -DVT/anticoagulation: Continue SCDs.  Venous Doppler studies negative             -antiplatelet therapy: N/A 3. Pain Management: Continue MS Contin 15 mg every 12 hours, MSIR 15 mg every 6 hours as needed-using regularly.   Controlled with meds, d/c MS Contin, cont Oxy IR 43m q6h prn- only took 2 tabs yesterday , increase gabapentin to TID     4. Depression: Continue Zoloft 50 mg daily.  Provide emotional support             -antipsychotic agents: N/A 5. Neuropsych: This patient is capable of making decisions on his own behalf. 6. Skin/Wound Care: Routine skin checks 7. Fluids/Electrolytes/Nutrition: Routine in and outs 8.  AVN left  hip.  Status post left total hip arthroplasty with antibiotic spacer left hip excisional debridement of skin subcutaneous tissue muscle bone deep abscess 09/15/2020 per Dr. Mardelle Matte.    Weightbearing as tolerated with posterior hip precautions 9.  Left groin and left hip abscess.  Status post multiple I&D's.  Wound VAC as directed-can likely be removed next week.- Gen Surg-Dr Cornett.  , drains removed with smaller fluid collections 10.  ID.  Continue ceftezole 2 g every 8 hours and Flagyl 500 mg every 8 hours and rifampin 300 mg every 12 hours through 10/28/2020 and stop.  Follow-up infectious disease Increased leukocytosis of 18 on 3/30 but remains afeb With increased fluid collection , thin sheets along the vastus int and semimembranosus, afebrile, non tender but WBC are up await ortho rec, may need image  guided aspiration Will check CRP, ESR, ask ID to weigh in- clear fluid may be related to anasarca 11.  Hepatic cirrhosis with ascites.  Status post multiple paracentesis.  GI recommends outpatient EGD.  Latest paracentesis 10/12/2020.  Continue Chronulac 10 mg twice daily.   Will need weekly paracentesis , f/u GI 12.  Scrotal edema.  Follow-up urology services Dr. Alinda Money.  No concern for infection.  per Uro Dr Louis Meckel ok to remove foley and do voiding trial   Voiding well  13.  Anemia/thrombocytopenia.    Hemoglobin 8.5 on 3/24, continue to monitor 14.  Hypertension.  Lopressor 50 mg twice daily, Aldactone 100 mg daily, Lasix 40 mg daily   Vitals:   10/24/20 2000 10/25/20 0415  BP: 118/86 114/80  Pulse: (!) 102 96  Resp: 18 18  Temp: 98.1 F (36.7 C) 98.7 F (37.1 C)  SpO2: 98% 96%   15.  Tobacco abuse.  Counseling 16. Hyponatremia:   Sodium 129 on 3/25, 132 on 3/28, monitor trend, asymptomatic likely diuretic related  17. Hypoalbuminemia: 1.6, continue high protein foods, recommended girlfriend bring in nuts, Still 1.4, + proteinuria  4/3-we will order albumin with his paracentesis 18.  Hypo K+ related to diuretic will give low dose and monitor closely due to spironolactone LOS: 15 days A FACE TO [place] E Cicilia Clinger 10/25/2020, 8:19 AM

## 2020-10-25 NOTE — Progress Notes (Signed)
Occupational Therapy Session Note  Patient Details  Name: [name] MRN: [id number] Date of Birth: 1986-01-22  Today's Date: 10/25/2020 OT Individual Time: 1401-1501 OT Individual Time Calculation (min): 60 min    Short Term Goals: Week 2:  OT Short Term Goal 1 (Week 2): Continue working on established LTGs set a min guard to supervision.  Skilled Therapeutic Interventions/Progress Updates:    Pt in bed to start session agreeable to session.  He worked on transitions from supine to sit and sit to supine.  Increased difficulty advancing the LLE off or onto the bed with attempts, so leg lifter brought in for practice.  He was able to use it effectively and transfer from supine to sitting on the left side of the bed with supervision as well as transitioning back to supine.  He then completed stand pivot transfer to the wheelchair with min guard assist using the RW for support.  Took pt down to the dayroom where he worked on sit to stands, standing balance, retrieving items from the floor with use of the reacher, and functional mobility with use of the RW.  Min guard assist for sit to stand transitions from the wheelchair with close supervision for standing balance.  He was able to ambulate with the RW with min guard and integrate a reacher for picking items up off of the floor with adherence to his hip precautions.  He did report some pain with flexing of the left hip when reaching down to the floor.  Several intervals were completed with picking up bean bags after tossing them at the corn hold board.  Finished session with return to the room and pt staying up in the wheelchair for next PT session.  Call button and phone in reach.    Therapy Documentation Precautions:  Precautions Precautions: Fall,Posterior Hip,Other (comment) Precaution Comments: foley, wound vac L thigh, L JP drains L thigh Restrictions Weight Bearing Restrictions: No LLE Weight Bearing: Weight bearing as  tolerated Other Position/Activity Restrictions: 3 JP drains in Lt anterior thigh and wound vac  Pain: Pain Assessment Pain Scale: 0-10 Pain Score: 5  Pain Type: Acute pain Pain Location: Hip Pain Orientation: Left Pain Descriptors / Indicators: Discomfort Pain Onset: With Activity Pain Intervention(s): Repositioned ADL: See Care Tool Section for some details of mobility and selfcare  Therapy/Group: Individual Therapy  Teasha Murrillo OTR/L 10/25/2020, 4:15 PM

## 2020-10-25 NOTE — Progress Notes (Signed)
Physical Therapy Weekly Progress Note  Patient Details  Name: [name] MRN: [id number] Date of Birth: [birth year]  Beginning of progress report period: October 18, 2020 End of progress report period: October 25, 2020  Today's Date: 10/25/2020 PT Individual Time: 0809-0902 PT Individual Time Calculation (min): 53 min   Patient has met 3 of 3 short term goals.  [name] is progressing well with therapy demonstrating increasing independence with functional mobility. He is performing supine<>sit with CGA/min assist due to L LE paresis and pain now utilizing leg lifter to increase independence. Performs sit<>stand and stand pivot transfers using RW with CGA relying heavily on use of UEs to assist with lifting/lowering due to limited ability to tolerate L LE WBing during this transition while maintaining hip precautions. He is ambulating up to 157f using RW with CGA demonstrating improving gait mechanics and increased L LE WBing tolerance. He will benefit from continued CIR level therapies to continue progressing towards LTGs with estimated D/C on 4/9.  Patient continues to demonstrate the following deficits muscle weakness and muscle joint tightness, decreased cardiorespiratoy endurance and decreased standing balance, decreased balance strategies and difficulty maintaining precautions and therefore will continue to benefit from skilled PT intervention to increase functional independence with mobility.  Patient progressing toward long term goals..  Continue plan of care.  PT Short Term Goals Week 2:  PT Short Term Goal 1 (Week 2): Patient will complete bed mobility with CGA consistently PT Short Term Goal 1 - Progress (Week 2): Met PT Short Term Goal 2 (Week 2): Patient will complete sit <>stand with LRAD and MinA PT Short Term Goal 2 - Progress (Week 2): Met PT Short Term Goal 3 (Week 2): Patient will tolerate flexing L knee >25* in supine PT Short Term Goal 3 - Progress (Week 2): Met Week  3:  PT Short Term Goal 1 (Week 3): = to LTGs based on ELOS  Skilled Therapeutic Interventions/Progress Updates:  Ambulation/gait training;Cognitive remediation/compensation;Discharge planning;DME/adaptive equipment instruction;Functional mobility training;Pain management;Psychosocial support;Splinting/orthotics;Therapeutic Activities;UE/LE Strength taining/ROM;Visual/perceptual remediation/compensation;Balance/vestibular training;Disease management/prevention;Community reintegration;Functional electrical stimulation;Neuromuscular re-education;Patient/family education;Skin care/wound management;Stair training;Therapeutic Exercise;UE/LE Coordination activities;Wheelchair propulsion/positioning    Pt received supine in bed and agreeable to therapy session. Reports recent pain medication administration. Pt reports feeling general "nervousness" about going home but no specific concerns. Pt reports he has a ramped entrance as opposed to the 2STE; however, his ramp is steeper than ours on CIR. Pt reports that his girlfriend's 25y.o.daughter, who is a nMarine scientist is planning to also provide him support at D/C. Performed supine L LE AAROM heel slides into hip/knee flexion - without assist pt demonstrates significantly impaired hip flexor and hamstring strength only able to achieve ~20degrees of knee flexion but with assist can achieve ~45degrees of knee flexion prior to being limited by pain - reports feeling "hard places" in L groin near where pain is located with this activity. Supine>sitting L EOB, HOB flat and not using bedrails, supervision and increased time for L LE management (does not require AD nor use of R LE to manage L LE). Sitting EOB donned pants and shoes max assist for time management and to maintain L posterior hip precautions. MD in/out for morning assessment - discussed pt's L LE hip and knee flexor strength impairments with limited AROM in supine. Discussed home set-up with pt reporting EOB higher at  home - discussed possibility of removing box spring to lower it slightly to increase pt safety while still allowing him increased independence with sit>stand transfer. Sit<>stands using  RW with CGA throughout session from average height seats with armrests to allow pt to push up using B UEs. Gait training ~64f using RW with CGA - demos reciprocal stepping pattern with pt tolerating increased L LE WBing though still some compensation via B UEs on RW during L stance - increasing gait speed and more normal L LE gait mechanics. B UE w/c propulsion ~2074fto ortho gym with supervision. Gait training ~1515fp/down ramp using RW with CGA - educated pt on taking smaller steps and slower AD management on his steeper ramp at home. Simulated car transfer with pt initially reporting plan to ride home in his GF's 2 door sports car; however, once started simulation of this transfer determined this would not be safe and allow pt to maintain precautions therefore planning to use his GF's daughter's Jeep. [facility] assist for L LE management in/out of car - educated pt on possibly sitting in back bench seat to allow him to scoot back further onto the seat prior to moving L LE in/out of vehicle. Transported pt back to room in w/c and left sitting with needs in reach, seat belt alarm on, and B LEs elevated.   Therapy Documentation Precautions:  Precautions Precautions: Fall,Posterior Hip,Other (comment) Precaution Comments: foley, wound vac L thigh, L JP drains L thigh Restrictions Weight Bearing Restrictions: No LLE Weight Bearing: Weight bearing as tolerated Other Position/Activity Restrictions: 3 JP drains in Lt anterior thigh and wound vac  Pain: Reports some increased pain during session, unrated, and repeatedly states he is "OK" - premedicated - provided rest breaks, repositioning, and distraction for pain management.  Therapy/Group: Individual Therapy  CarTawana ScalePT, DPT, CSRS  10/25/2020, 7:56 AM

## 2020-10-25 NOTE — Progress Notes (Signed)
Physical Therapy Session Note  Patient Details  Name: [name] MRN: [id number] Date of Birth: 1986-04-03  Today's Date: 10/25/2020 PT Individual Time: 6153-7943 PT Individual Time Calculation (min): 32 min   Short Term Goals: Week 2:  PT Short Term Goal 1 (Week 2): Patient will complete bed mobility with CGA consistently PT Short Term Goal 2 (Week 2): Patient will complete sit <>stand with LRAD and MinA PT Short Term Goal 3 (Week 2): Patient will tolerate flexing L knee >25* in supine  Skilled Therapeutic Interventions/Progress Updates:    Pt received sitting in WC. Report of L hip pain and RN notified, but agreeable to therapy. Transferred sit to stand CGA RW. Gait training to therapy gym with WC follow for safety. Able to ambulate ~134f CGA RW before requiring sitting rest break; wheeled to gym for remainder of distance. STS CGA RW for B LE strengthening and to increase LLE WB tolerance. Performed 3x; pt demonstrates increased difficulty and rest required in between reps to perform activity, and verbalizes inability to bear any significant amount of weight through LLE due to pain. Pt also reports increased R shoulder pain. RN met in therapy gym to administer pain medication. RN left gym and PT remained assuming pt care. Pt wheeled back to room and transferred into sitting EOB CGA RW. Transferred to supine min assist and use of leg lifter for LLE. Needs within reach and be alarm turned on.  Therapy Documentation Precautions:  Precautions Precautions: Fall,Posterior Hip,Other (comment) Precaution Comments: foley, wound vac L thigh, L JP drains L thigh Restrictions Weight Bearing Restrictions: No LLE Weight Bearing: Weight bearing as tolerated Other Position/Activity Restrictions: 3 JP drains in Lt anterior thigh and wound vac    Therapy/Group: Individual Therapy  KEleonore Chiquito SPT 10/25/2020, 6:14 PM

## 2020-10-25 NOTE — Plan of Care (Signed)
  Problem: Consults Goal: RH GENERAL PATIENT EDUCATION Description: See Patient Education module for education specifics. Outcome: Progressing Goal: Skin Care Protocol Initiated - if Braden Score 18 or less Description: If consults are not indicated, leave blank or document N/A Outcome: Progressing Goal: Nutrition Consult-if indicated Outcome: Progressing   Problem: RH BOWEL ELIMINATION Goal: RH STG MANAGE BOWEL WITH ASSISTANCE Description: STG Manage Bowel with mod I Assistance. Outcome: Progressing   Problem: RH BLADDER ELIMINATION Goal: RH STG MANAGE BLADDER WITH ASSISTANCE Description: STG Manage Bladder With mod I Assistance Outcome: Progressing Goal: RH STG MANAGE BLADDER WITH EQUIPMENT WITH ASSISTANCE Description: STG Manage Bladder With Equipment With  min Assistance Outcome: Progressing   Problem: RH SKIN INTEGRITY Goal: RH STG SKIN FREE OF INFECTION/BREAKDOWN Description: Skin will be free of infection/breakdown with min assist Outcome: Progressing Goal: RH STG MAINTAIN SKIN INTEGRITY WITH ASSISTANCE Description: STG Maintain Skin Integrity With min Assistance. Outcome: Progressing Goal: RH STG ABLE TO PERFORM INCISION/WOUND CARE W/ASSISTANCE Description: STG Able To Perform Incision/Wound Care With min Assistance. Outcome: Progressing   Problem: RH SAFETY Goal: RH STG ADHERE TO SAFETY PRECAUTIONS W/ASSISTANCE/DEVICE Description: STG Adhere to Safety Precautions With cues/reminders Assistance/Device. Outcome: Progressing   Problem: RH PAIN MANAGEMENT Goal: RH STG PAIN MANAGED AT OR BELOW PT'S PAIN GOAL Description: Pain will be managed at of below 3 out of 10 on pain scale with min assist Outcome: Progressing   Problem: RH KNOWLEDGE DEFICIT GENERAL Goal: RH STG INCREASE KNOWLEDGE OF SELF CARE AFTER HOSPITALIZATION Description: Patient will be able to manage self care at discharge using handouts and educational materials with cues/reminders Outcome:  Progressing   

## 2020-10-26 NOTE — Progress Notes (Signed)
Physical Therapy Session Note  Patient Details  Name: [name] MRN: [id number] Date of Birth: 1986-04-21  Today's Date: 10/26/2020 PT Individual Time: 0808-0920 PT Individual Time Calculation (min): 72 min   Short Term Goals: Week 3:  PT Short Term Goal 1 (Week 3): = to LTGs based on ELOS  Skilled Therapeutic Interventions/Progress Updates:    Pt received sitting in WC and agreeable to therapy. Reported same L hip pain as yesterday, but appeared to be in more pain today. Received pain medication earlier this morning. Transferred to standing CGA RW; relies heavily on B UE to come to stand. Verbal cuing to "drop shoulders" assists with relaxation for pain management. Gait training CGA RW ~30ft with WC follow in event of fatigue/pain management; required sitting rest due to pain and wheeled rest of way to therapy gym for energy conservation. Demonstrated increased LLE WB tolerance. Pt will have ramp to enter home, but when asked if he is willing to try stairs he denied. Asked if he also wanted to practice car transfer today but he denied, verbalizing "it hurt." Tried transfer yesterday with low height to mimic his car, but he was unable to maintain posterior hip precautions. However pt verbalized that he will have family member's Jeep available to transfer into on d/c day. Pt transferred to sitting EOB on therapy mat CGA RW. Transferred to supine min assist for LLE. Heel slides 4x8 AAROM (with intermittent long-stretch holds) to increase knee flexion. Can reach increased range when verbally cued to "breathe in through nose and take long exhale through mouth." Pt able to actively achieve ~30 degrees knee flex, and 110 degrees with assistance. Pt occasionally verbalized feeling "good stretch on hip" during static hold of max knee flex. Supine L hip abd with AAROM for increased abductor strength and range performed 4x10 with intermittent long holds at max abd. Pt applied pressure to his posterior L hip  with his own fingers as he verbalized it "makes it feel better." Transferred to sitting EOB supervision. Transferred to standing CGA RW. Pt verbalized not wanting to walk back to room due to pain, so able to self-propel WC ~116ft with supervision before verbalizing shoulder pain; wheeled back for rest of distance to room. Pt left sitting up in [facility] with seat belt alarm turned on and needs within reach.  Therapy Documentation Precautions:  Precautions Precautions: Fall,Posterior Hip,Other (comment) Precaution Comments: foley, wound vac L thigh, L JP drains L thigh Restrictions Weight Bearing Restrictions: No LLE Weight Bearing: Weight bearing as tolerated Other Position/Activity Restrictions: 3 JP drains in Lt anterior thigh and wound vac    Therapy/Group: Individual Therapy  Paschal Dopp, SPT 10/26/2020, 7:45 AM

## 2020-10-26 NOTE — Progress Notes (Signed)
Patient ID: [name], male   DOB: [birth year], 35 y.o.   MRN: [id number]   Pt referral sent to [facility]  [place], [place] [phone]

## 2020-10-26 NOTE — Patient Care Conference (Signed)
Inpatient RehabilitationTeam Conference and Plan of Care Update Date: 10/26/2020   Time: 10:30 AM    Patient Name: [name]      Medical Record Number: [id number]  Date of Birth: 1985-09-15 Sex: Male         Room/Bed: 4W24C/4W24C-01 Payor Info: Payor: [employer]  / Plan: [employer] / Product Type: *No Product type* /    Admit Date/Time:  10/10/2020  2:34 PM  Primary Diagnosis:  Debility  Hospital Problems: Principal Problem:   Debility Active Problems:   Septic shock (HCC)   Ascites   Hyponatremia   Essential hypertension   AVN (avascular necrosis of bone) (HCC)   Abscess of hip, left   S/P hip replacement, left    Expected Discharge Date: Expected Discharge Date: 10/28/20  Team Members Present: Physician leading conference: Dr. Claudette Laws Care Coodinator Present: Chana Bode, RN, BSN, CRRN;Christina Porcupine, BSW Nurse Present: Chana Bode, RN PT Present: Merry Lofty, PT OT Present: Perrin Maltese, OT PPS Coordinator present : Fae Pippin, SLP     Current Status/Progress Goal Weekly Team Focus  Bowel/Bladder   Continent B/B LBM 4/5  Remain continent  Keep urinal at beside. Assess bowel/bladder habits prn   Swallow/Nutrition/ Hydration             ADL's   UB selfcare at supervision, LB selfcare at min to min guard with AE, transfers at min to min guard with use of the RW.  MinA/supervision  selfcare retraining, balance retraining, DME/AE education, balane retraining, therapeutic exercise   Mobility   CGA/min assist bed mobility, CGA/min assist sit<>stand and stand pivot transfers using RW, CGA gait up to 159ft using RW, supervision w/c mobility 222ft, gait up/down ramp using RW CGA  CGA overall at ambulatory level  pain management, L LE strengthening and ROM, bed mobility training, transfer training, gait training, pt education, activity tolerance, car transfers, D/C planning   Communication             Safety/Cognition/ Behavioral  Observations            Pain   PRN oxy q6H, neuropathy pain LLE- scheduled gabapentin, pain 3-7 out of 10  <4/10  assess q shift and PRN. Pre med for therapy   Skin   Lt hip surgical incision-pin point drainage-ABD pad daily, Lt groin wet to dry dressing BID-red granulation tissue, Suture to Lt thigh incision and xereform to pink/red opened area  skin free of infection no breakdown  Continue to assess q shift and PRN. Have family participating in dressing changes     Discharge Planning:  Pt discharging home with girlfriend. Intermittent A (works during day), 1 level home 2 steps to enter   Team Discussion: IV abx complete as of 10/28/20. D/cd foley; voiding trial successful. Requiring weekly paracentesis for abd, ascites; to continue after discharge. Fluid over femur area; pin hole with heavy drainage; likely anasarca. MD noted ortho recommended keeping meds the same and no surgical intervention required. WBC WNL; follow up with ortho after discharge. Pain is managed with prn medications.  Patient on target to meet rehab goals: yes, currently supervision for upper body self care and min assist for lower body care. Min guard transfers. CGA for mobility and able to ambulate house hold distance with supervision.   *See Care Plan and progress notes for long and short-term goals.   Revisions to Treatment Plan:   Teaching Needs: Transfers, toileting, wound care/skin care, medications, etc.  Current Barriers to Discharge:  Decreased caregiver support, Home enviroment access/layout, Wound care and Weight bearing restrictions  Possible Resolutions to Barriers: Ramp installation for entry to home recommended Family education    Medical Summary Current Status: on IV abx, ascites requiring weekly paracentesis, anasarca on diuretics with low K+  Barriers to Discharge: IV antibiotics;Medical stability;Wound care   Possible Resolutions to Barriers/Weekly Focus: Monitor ascites, monitor electroytes,  set up OP IR /GI for ascites management   Continued Need for Acute Rehabilitation Level of Care: The patient requires daily medical management by a physician with specialized training in physical medicine and rehabilitation for the following reasons: Direction of a multidisciplinary physical rehabilitation program to maximize functional independence : Yes Medical management of patient stability for increased activity during participation in an intensive rehabilitation regime.: Yes Analysis of laboratory values and/or radiology reports with any subsequent need for medication adjustment and/or medical intervention. : Yes   I attest that I was present, lead the team conference, and concur with the assessment and plan of the team.   Chana Bode B 10/26/2020, 5:09 PM

## 2020-10-26 NOTE — Progress Notes (Signed)
Physical Therapy Session Note  Patient Details  Name: [name] MRN: [id number] Date of Birth: 1985-12-19  Today's Date: 10/26/2020 PT Individual Time: 1500-1530 PT Individual Time Calculation (min): 30 min   Short Term Goals: Week 3:  PT Short Term Goal 1 (Week 3): = to LTGs based on ELOS  Skilled Therapeutic Interventions/Progress Updates:    Patient received sitting up in wc, agreeable to PT. He reports 5/10 pain in L hip, premedicated. PT providing rest breaks, distractions and rest breaks to assist with pain management. He was able to propel himself in wc ModI to dayroom. Kinetron 3x3' with progressive resistance. He was able to tolerate more resistance than prior sessions and increased L hip ROM with minimal increase in pain. Patient returning to room in wc, call light within reach.   Therapy Documentation Precautions:  Precautions Precautions: Fall,Posterior Hip,Other (comment) Precaution Comments: foley, wound vac L thigh, L JP drains L thigh Restrictions Weight Bearing Restrictions: No LLE Weight Bearing: Weight bearing as tolerated Other Position/Activity Restrictions: 3 JP drains in Lt anterior thigh and wound vac    Therapy/Group: Individual Therapy  Elizebeth Koller, PT, DPT, CBIS  10/26/2020, 7:37 AM

## 2020-10-26 NOTE — Progress Notes (Signed)
PROGRESS NOTE   Subjective/Complaints: Leg pain doing ok, only takes 2 oxy IR per day  No abd pain or pressure noted  ROS:   Pt denies SOB, CP, N/V/C/D, and vision changes  Objective:   IR Paracentesis  Result Date: 10/24/2020 INDICATION: Patient with history of septic arthritis, staph bacteremia, alcoholic cirrhosis, recurrent ascites. Request received for therapeutic paracentesis. EXAM: ULTRASOUND GUIDED THERAPEUTIC PARACENTESIS MEDICATIONS: 1% lidocaine to skin and subcutaneous tissue COMPLICATIONS: None immediate. PROCEDURE: Informed written consent was obtained from the patient after a discussion of the risks, benefits and alternatives to treatment. A timeout was performed prior to the initiation of the procedure. Initial ultrasound scanning demonstrates a moderate amount of ascites within the right lower abdominal quadrant. The right lower abdomen was prepped and draped in the usual sterile fashion. 1% lidocaine was used for local anesthesia. Following this, a 19 gauge, 7-cm, Yueh catheter was introduced. An ultrasound image was saved for documentation purposes. The paracentesis was performed. The catheter was removed and a dressing was applied. The patient tolerated the procedure well without immediate post procedural complication. Patient received post-procedure intravenous albumin; see nursing notes for details. FINDINGS: A total of approximately 3.8 liters of yellow fluid was removed. IMPRESSION: Successful ultrasound-guided therapeutic paracentesis yielding 3.8 liters of peritoneal fluid. Read by: Jeananne Rama, PA-C Electronically Signed   By: Simonne Come M.D.   On: 10/24/2020 16:27   Recent Labs    10/24/20 0510  WBC 9.9  HGB 8.6*  HCT 27.3*  PLT 267   Recent Labs    10/24/20 0510  NA 135  K 3.3*  CL 102  CO2 22  GLUCOSE 90  BUN <5*  CREATININE <0.30*  CALCIUM 8.0*    Intake/Output Summary (Last 24 hours) at  10/26/2020 2130 Last data filed at 10/26/2020 0859 Gross per 24 hour  Intake 120 ml  Output 1975 ml  Net -1855 ml        Physical Exam: Vital Signs Blood pressure 119/82, pulse 91, temperature 98.7 F (37.1 C), resp. rate 17, height 5\' 9"  (1.753 m), weight 78 kg, SpO2 97 %.  General: No acute distress Mood and affect are appropriate Heart: Regular rate and rhythm no rubs murmurs or extra sounds Lungs: Clear to auscultation, breathing unlabored, no rales or wheezes Abdomen: Positive bowel sounds, soft nontender to palpation, nondistended Extremities: No clubbing, cyanosis, or edema Skin: No evidence of breakdown, no evidence of rash  Skin: Warm and dry. Left thigh drain site with clear fluid on foam dressing - no change Musc: Bilateral lower extremity edema, no pain to palpation left ant and post thigh  Bilateral upper extremities: 5/5 proximal distal Right lower extremity: 4/5 proximal distally left lower extremity: Hip flexion, knee extension 2+/5 (pain inhibition)   Assessment/Plan: 1. Functional deficits which require 3+ hours per day of interdisciplinary therapy in a comprehensive inpatient rehab setting.  Physiatrist is providing close team supervision and 24 hour management of active medical problems listed below.  Physiatrist and rehab team continue to assess barriers to discharge/monitor patient progress toward functional and medical goals  Care Tool:  Bathing    Body parts bathed by patient: Right arm,Left arm,Chest,Abdomen,Right upper  leg,Left upper leg,Right lower leg,Left lower leg,Face,Front perineal area,Buttocks   Body parts bathed by helper: Front perineal area,Left upper leg Body parts n/a: Front perineal area,Buttocks,Left upper leg   Bathing assist Assist Level: Minimal Assistance - Patient > 75%     Upper Body Dressing/Undressing Upper body dressing   What is the patient wearing?: Hospital gown only    Upper body assist Assist Level: Set up assist     Lower Body Dressing/Undressing Lower body dressing    Lower body dressing activity did not occur: Safety/medical concerns (Patient with multiple drains and wound vac) What is the patient wearing?: Pants     Lower body assist Assist for lower body dressing: Minimal Assistance - Patient > 75%     Toileting Toileting    Toileting assist Assist for toileting: Dependent - Patient 0% (foley)     Transfers Chair/bed transfer  Transfers assist     Chair/bed transfer assist level: Contact Guard/Touching assist Chair/bed transfer assistive device: Licensed conveyancer   Ambulation assist      Assist level: Contact Guard/Touching assist Assistive device: Walker-rolling Max distance: 20ft   Walk 10 feet activity   Assist  Walk 10 feet activity did not occur: Safety/medical concerns  Assist level: Contact Guard/Touching assist Assistive device: Walker-rolling   Walk 50 feet activity   Assist Walk 50 feet with 2 turns activity did not occur: Safety/medical concerns  Assist level: Contact Guard/Touching assist Assistive device: Walker-rolling    Walk 150 feet activity   Assist Walk 150 feet activity did not occur: Safety/medical concerns  Assist level: Contact Guard/Touching assist Assistive device: Walker-rolling    Walk 10 feet on uneven surface  activity   Assist Walk 10 feet on uneven surfaces activity did not occur: Safety/medical concerns   Assist level: Contact Guard/Touching assist Assistive device: Photographer Will patient use wheelchair at discharge?: Yes Type of Wheelchair: Manual    Wheelchair assist level: Supervision/Verbal cueing,Set up assist Max wheelchair distance: 213ft    Wheelchair 50 feet with 2 turns activity    Assist        Assist Level: Supervision/Verbal cueing   Wheelchair 150 feet activity     Assist      Assist Level: Supervision/Verbal cueing    Blood pressure 119/82, pulse 91, temperature 98.7 F (37.1 C), resp. rate 17, height 5\' 9"  (1.753 m), weight 78 kg, SpO2 97 %.     Medical Problem List and Plan: 1.  Debility secondary to septic shock/left gluteal/groin abscesses/septic arthritis with history of avascular necrosis left hip  Continue CIR, tent d/c 4/8 still looks appropriate no drainage rec per IR, no change in abx Voiding well post foley removal   Continue PT and OT  2.  Impaired mobility: -DVT/anticoagulation: Continue SCDs.  Venous Doppler studies negative             -antiplatelet therapy: N/A 3. Pain Management: Continue MS Contin 15 mg every 12 hours, MSIR 15 mg every 6 hours as needed-using regularly.   Controlled with meds, d/c MS Contin, cont Oxy IR 5mg  q6h prn- only took 2 tabs yesterday , increase gabapentin to TID     4. Depression: Continue Zoloft 50 mg daily.  Provide emotional support             -antipsychotic agents: N/A 5. Neuropsych: This patient is capable of making decisions on his own behalf. 6. Skin/Wound Care: Routine skin checks 7. Fluids/Electrolytes/Nutrition: Routine  in and outs 8.  AVN left hip.  Status post left total hip arthroplasty with antibiotic spacer left hip excisional debridement of skin subcutaneous tissue muscle bone deep abscess 09/15/2020 per Dr. Dion Saucier.    Weightbearing as tolerated with posterior hip precautions 9.  Left groin and left hip abscess.  Status post multiple I&D's.  Wound VAC as directed-can likely be removed next week.- Gen Surg-Dr Cornett.  , drains removed with smaller fluid collections 10.  ID.  Continue ceftezole 2 g every 8 hours and Flagyl 500 mg every 8 hours and rifampin 300 mg every 12 hours through 10/28/2020 and stop.  Follow-up infectious disease Increased leukocytosis of 18 on 3/30 but remains afeb With increased fluid collection , thin sheets along the vastus int and semimembranosus, afebrile, non tender but WBC are up await ortho rec,not accessible  image guided aspiration Per ID no need for abx change 11.  Hepatic cirrhosis with ascites.  Status post multiple paracentesis.  GI recommends outpatient EGD.  Latest paracentesis 10/12/2020.  Continue Chronulac 10 mg twice daily.   Will need weekly paracentesis , f/u GI 12.  Scrotal edema.  Follow-up urology services Dr. Laverle Patter.  No concern for infection.  per Uro Dr Marlou Porch ok to remove foley and do voiding trial   Voiding well  13.  Anemia/thrombocytopenia.    Hemoglobin 8.5 on 3/24, continue to monitor 14.  Hypertension.  Lopressor 50 mg twice daily, Aldactone 100 mg daily, Lasix 40 mg daily   Vitals:   10/25/20 2016 10/26/20 0627  BP: 112/80 119/82  Pulse:  91  Resp: 14 17  Temp: 99.5 F (37.5 C) 98.7 F (37.1 C)  SpO2: 98% 97%   15.  Tobacco abuse.  Counseling 16. Hyponatremia:   Sodium 129 on 3/25, 132 on 3/28, monitor trend, asymptomatic likely diuretic related  17. Hypoalbuminemia: 1.6, continue high protein foods, recommended girlfriend bring in nuts, Still 1.4, + proteinuria  4/3-we will order albumin with his paracentesis 18.  Hypo K+ related to diuretic will give low dose and monitor closely due to spironolactone , check BMET in am LOS: 16 days A FACE TO FACE EVALUATION WAS PERFORMED  [name] 10/26/2020, 9:27 AM

## 2020-10-26 NOTE — Discharge Summary (Signed)
Physician Discharge Summary  Patient ID: PRICE LACHAPELLE MRN: [id number] DOB/AGE: 01-11-1986 35 y.o.  Admit date: 10/10/2020 Discharge date: 10/28/2020  Discharge Diagnoses:  Principal Problem:   Debility Active Problems:   Septic shock (HCC)   Ascites   Hyponatremia   Essential hypertension   AVN (avascular necrosis of bone) (HCC)   Abscess of hip, left   S/P hip replacement, left Mood stabilization Scrotal edema Anemia/thrombocytopenia Tobacco use  Discharged Condition: Stable  Significant Diagnostic Studies: CT CHEST WO CONTRAST  Result Date: 09/30/2020 CLINICAL DATA:  Fluid collection, drain placement EXAM: CT CHEST WITHOUT CONTRAST TECHNIQUE: Multidetector CT imaging of the chest was performed following the standard protocol without IV contrast. COMPARISON:  09/30/2020 FINDINGS: Cardiovascular: Unenhanced imaging of the heart great vessels demonstrates no pericardial effusion. Normal caliber of the thoracic aorta. Mediastinum/Nodes: No enlarged mediastinal or axillary lymph nodes. Thyroid gland, trachea, and esophagus demonstrate no significant findings. Lungs/Pleura: There is patchy bilateral upper lobe predominant airspace disease consistent with multifocal pneumonia. Trace left pleural effusion. No pneumothorax. Central airways are patent. Upper Abdomen: Hepatomegaly with nodular liver contour consistent with cirrhosis unchanged. Small volume upper abdominal ascites, greatest in the right upper quadrant. No other acute upper abdominal finding. Musculoskeletal: No acute or destructive bony lesions. Subcutaneous edema within the bilateral flanks. Reconstructed images demonstrate no additional findings. IMPRESSION: 1. Patchy bilateral upper lobe predominant airspace disease consistent with multifocal pneumonia. 2. Trace left pleural effusion. 3. Cirrhosis. 4. Trace ascites. Electronically Signed   By: Sharlet Salina M.D.   On: 09/30/2020 19:16   CT ABDOMEN PELVIS W  CONTRAST  Result Date: 10/19/2020 CLINICAL DATA:  Wound dehiscence and drainage. EXAM: CT ABDOMEN AND PELVIS WITH CONTRAST TECHNIQUE: Multidetector CT imaging of the abdomen and pelvis was performed using the standard protocol following bolus administration of intravenous contrast. CONTRAST:  OMNIPAQUE IOHEXOL 300 MG/ML  SOLN COMPARISON:  September 30, 2020. FINDINGS: Lower chest: Minimal bibasilar subsegmental atelectasis is noted. Hepatobiliary: Findings concerning for hepatic cirrhosis. No gallstones or biliary dilatation is noted. Pancreas: Unremarkable. No pancreatic ductal dilatation or surrounding inflammatory changes. Spleen: Normal in size without focal abnormality. Adrenals/Urinary Tract: Adrenal glands and kidneys appear normal. Urinary bladder is decompressed secondary to Foley catheter. Stomach/Bowel: Stomach is within normal limits. Appendix appears normal. No evidence of bowel wall thickening, distention, or inflammatory changes. Vascular/Lymphatic: No adenopathy is noted. Gastric varicosities are noted consistent with hepatic cirrhosis. Reproductive: Prostate is unremarkable. Other: Percutaneous catheter draining left psoas muscle on prior exam has been removed. No significant residual fluid collection is noted. Left iliacus fluid collection noted on prior exam is nearly completely decompressed currently. Intramuscular fluid collection seen in the left hip region also appear to be significantly smaller compared to prior exam. The largest such fluid collection measures 3.8 x 1.1 cm. Mild to moderate subcutaneous edema is noted in the bilateral hip regions, left greater than right. Mild to moderate ascites is noted in the pelvis and around the liver. Musculoskeletal: Status post left hip arthroplasty. No acute osseous abnormality is noted. IMPRESSION: Minimal bibasilar subsegmental atelectasis. Hepatic cirrhosis is noted.  Mild to moderate ascites is noted. Percutaneous drainage catheter that was  noted in left psoas muscle on prior exam has been removed. No significant residual fluid collection is noted. Fluid collections noted on prior exam within the left iliacus muscle as well as in the musculature surrounding the left hip are either resolved or significantly smaller compared to prior exam. Electronically Signed   By: Roque Lias  Jr M.D.   On: 10/19/2020 12:49   CT ABDOMEN PELVIS W CONTRAST  Result Date: 09/30/2020 CLINICAL DATA:  35 year old male with history of MSSA bacteremia and left gluteal and thigh abscesses status post multiple drains. CT for follow-up in light of persistent leukocytosis. EXAM: CT ABDOMEN AND PELVIS WITH CONTRAST TECHNIQUE: Multidetector CT imaging of the abdomen and pelvis was performed using the standard protocol following bolus administration of intravenous contrast. CONTRAST:  OMNIPAQUE IOHEXOL 300 MG/ML  SOLN COMPARISON:  09/18/2020, 09/21/2020, 09/23/2020 FINDINGS: Lower chest: Interval development of scattered ground-glass in consolidative pulmonary opacities, most prominent in the right middle lobe and anterior left upper lobe, incompletely visualized. Trace left pleural effusion with associated left basilar passive atelectasis. Hepatobiliary: Similar appearing nodular contour of the liver with hypertrophied left lobe. There is mild periportal edema. No hepatoma identified. The gallbladder is decompressed with minimal surrounding ascites, no gallstones are visualized. No intra or extrahepatic biliary ductal dilation. Pancreas: Unremarkable. No pancreatic ductal dilatation or surrounding inflammatory changes. Spleen: Normal in size without focal abnormality. Adrenals/Urinary Tract: Adrenal glands are unremarkable. Kidneys are normal, without renal calculi, focal lesion, or hydronephrosis. Bladder is decompressed with Foley catheter in place. Stomach/Bowel: Stomach is within normal limits. Appendix is not definitively visualized. No evidence of bowel wall  thickening, distention, or inflammatory changes. 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 The portal system appears patent. No abdominopelvic lymphadenopathy. Reproductive: Bilateral, left greater at hydroceles are present. Prostate is unremarkable. Other: Diffuse body wall anasarca, advanced from comparison. Edema extends into the bilateral proximal thighs. Small volume ascites is present, most prominent in the pelvis. Musculoskeletal: Resolution of previously visualized psoas abscess at the site of indwelling retroperitoneal pigtail drain. In the superior aspect of the left iliacus muscle there is a peripherally enhancing fluid collection measuring approximately 3.4 x 2.5 x 1.6 cm, relatively unchanged from comparison studies. Within the anterolateral aspect of the proximal thigh, within the vastus musculature is a peripherally enhancing, lobular fluid collection measuring up to approximately 7.4 x 3.4 x 3.4 cm. There is persistent small fluid collection at the site of indwelling left anterior thigh pigtail drains which are unchanged in position. No new fluid collections are present. Status post debridement of the medial anterior left thigh with scattered subcutaneous emphysema, improved from comparison. Status post left total hip arthroplasty without complicating features. No evidence of cortical interruption along the femur at the sites of abscess formation. No acute fracture or malalignment. IMPRESSION: 1. Persistent undrained left iliacus and left anterolateral intramuscular fluid collections with peripheral enhancement concerning for abscesses. The iliacus fluid collection would be amenable to CT-guided aspiration and the anterior thigh fluid collection would be amenable to percutaneous drain placement. 2. Resolution of left psoas abscess after drain placement. 3. Persistent small peripheral enhancing fluid collections around the indwelling  distal anterior thigh drains. 4. Interval development of ground-glass and consolidative opacities in the mid lungs bilaterally, incompletely evaluated. Consider chest CT for further characterization as these findings are concerning for multifocal pneumonia. 5. Suggestion of fluid overload as evidence by interval worsening diffuse body wall anasarca, small volume ascites, trace left pleural effusion with associated left basilar passive atelectasis, and diffuse scrotal wall thickening with small volume hydroceles. 6. Morphologic changes of hepatic cirrhosis, no hepatoma visualized. Marliss Coots, MD [facility] Electronically Signed   By: Marliss Coots MD   On: 09/30/2020 14:38   CT FEMUR LEFT W CONTRAST  Result Date:  10/19/2020 CLINICAL DATA:  Soft tissue infection EXAM: CT OF THE LOWER LEFT EXTREMITY WITH CONTRAST TECHNIQUE: Multidetector CT imaging of the lower left extremity was performed according to the standard protocol following intravenous contrast administration. CONTRAST:  100mL OMNIPAQUE IOHEXOL 300 MG/ML  SOLN COMPARISON:  Radiographs of 10/19/2020 FINDINGS: Bones/Joint/Cartilage Left hip hemiarthroplasty. No compelling bony destructive findings characteristic of osteomyelitis. Spurring along the left acetabulum. Streak artifact from the hip implant obscures surrounding soft tissues and the joint space. Small knee joint effusion without enhancing synovitis along the suprapatellar bursa. Ligaments Suboptimally assessed by CT. Muscles and Tendons Abnormal band of enhancement with some central hypoenhancement extending vertically in the gluteus minimus muscle measuring about 1.5 by 1.2 cm on image 10 of series 4 and slightly longer vertically, incipient abscess not excluded. Several vastus intermedius abscesses are present. A lesion measured on image 123 series 4 measures 1.5 by 1.5 by 8.4 cm (volume = 9.9 cm^3). A lesion more medially on image  128 of series 4 measures 1.9 by 0.8 by 5.4 cm (volume = 4 cm^3). An abscess further distally on image 222 series 4 measures 2.4 by 1.9 by 13.9 cm (volume = 33 cm^3). Adjacent to this is an abscess measuring 1.6 by 1.2 by 6.0 cm (volume = 6 cm^3) on image 189 of series 4. Abscesses track along the semimembranosus muscle including a 3.9 by 0.9 by 14.0 cm (volume = 30 cm^3) lesion along the deep margin of the muscle distally measured on image 261 of series 4, and a lesion along the superficial fascia margin measuring 4.5 by 1.6 by 23.0 cm (volume = 87 cm^3). Soft tissues There is a wound along the superficial margin of the left sartorius muscle proximally probably from drainage. Fluid collection in the proximal subcutaneous tissues of the posteromedial thigh with enhancing margins measures about 2.2 by 6.9 by 12.8 cm (volume = 100 cm^3). Diffuse subcutaneous edema in the thigh and overlying the hip. Pelvic ascites. Abnormal fluid and marginal enhancement in the left hemiscrotum with cutaneous thickening, infection involving the scrotum is not excluded. IMPRESSION: 1. Subcutaneous and intramuscular abscesses in the left thigh as described above. Intramuscular abscesses are particularly notable in the vastus intermedius although there is also involvement of the margin of the semimembranosus muscle and involvement of the gluteus minimus muscle. 2. Fluid with abnormal enhancing margin in the left hemiscrotum, pyocele not excluded. Skin thickening in the left hemiscrotum. 3. Diffuse subcutaneous edema in the thigh, cellulitis is a possibility. 4. Pelvic ascites. Electronically Signed   By: Gaylyn RongWalter  Liebkemann M.D.   On: 10/19/2020 13:19   CT FEMUR LEFT W CONTRAST  Result Date: 09/30/2020 CLINICAL DATA:  35 year old male with history of MSSA bacteremia and left gluteal and thigh abscesses status post multiple drains. CT for follow-up in light of persistent leukocytosis. EXAM: CT ABDOMEN AND PELVIS WITH CONTRAST TECHNIQUE:  Multidetector CT imaging of the abdomen and pelvis was performed using the standard protocol following bolus administration of intravenous contrast. CONTRAST:  100mL OMNIPAQUE IOHEXOL 300 MG/ML  SOLN COMPARISON:  09/18/2020, 09/21/2020, 09/23/2020 FINDINGS: Lower chest: Interval development of scattered ground-glass in consolidative pulmonary opacities, most prominent in the right middle lobe and anterior left upper lobe, incompletely visualized. Trace left pleural effusion with associated left basilar passive atelectasis. Hepatobiliary: Similar appearing nodular contour of the liver with hypertrophied left lobe. There is mild periportal edema. No hepatoma identified. The gallbladder is decompressed with minimal surrounding ascites, no gallstones are visualized. No intra or extrahepatic biliary ductal dilation.  Pancreas: Unremarkable. No pancreatic ductal dilatation or surrounding inflammatory changes. Spleen: Normal in size without focal abnormality. Adrenals/Urinary Tract: Adrenal glands are unremarkable. Kidneys are normal, without renal calculi, focal lesion, or hydronephrosis. Bladder is decompressed with Foley catheter in place. Stomach/Bowel: Stomach is within normal limits. Appendix is not definitively visualized. No evidence of bowel wall thickening, distention, or inflammatory changes. 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 The portal system appears patent. No abdominopelvic lymphadenopathy. Reproductive: Bilateral, left greater at hydroceles are present. Prostate is unremarkable. Other: Diffuse body wall anasarca, advanced from comparison. Edema extends into the bilateral proximal thighs. Small volume ascites is present, most prominent in the pelvis. Musculoskeletal: Resolution of previously visualized psoas abscess at the site of indwelling retroperitoneal pigtail drain. In the superior aspect of the left iliacus muscle there is a  peripherally enhancing fluid collection measuring approximately 3.4 x 2.5 x 1.6 cm, relatively unchanged from comparison studies. Within the anterolateral aspect of the proximal thigh, within the vastus musculature is a peripherally enhancing, lobular fluid collection measuring up to approximately 7.4 x 3.4 x 3.4 cm. There is persistent small fluid collection at the site of indwelling left anterior thigh pigtail drains which are unchanged in position. No new fluid collections are present. Status post debridement of the medial anterior left thigh with scattered subcutaneous emphysema, improved from comparison. Status post left total hip arthroplasty without complicating features. No evidence of cortical interruption along the femur at the sites of abscess formation. No acute fracture or malalignment. IMPRESSION: 1. Persistent undrained left iliacus and left anterolateral intramuscular fluid collections with peripheral enhancement concerning for abscesses. The iliacus fluid collection would be amenable to CT-guided aspiration and the anterior thigh fluid collection would be amenable to percutaneous drain placement. 2. Resolution of left psoas abscess after drain placement. 3. Persistent small peripheral enhancing fluid collections around the indwelling distal anterior thigh drains. 4. Interval development of ground-glass and consolidative opacities in the mid lungs bilaterally, incompletely evaluated. Consider chest CT for further characterization as these findings are concerning for multifocal pneumonia. 5. Suggestion of fluid overload as evidence by interval worsening diffuse body wall anasarca, small volume ascites, trace left pleural effusion with associated left basilar passive atelectasis, and diffuse scrotal wall thickening with small volume hydroceles. 6. Morphologic changes of hepatic cirrhosis, no hepatoma visualized. Marliss Coots, MD [facility]  Electronically Signed   By: Marliss Coots MD   On: 09/30/2020 14:38   CT ASPIRATION  Result Date: 10/03/2020 INDICATION: 35 year old male with history of MSSA bacteremia and left gluteal and thigh abscesses status post multiple drains. Presents with increasing leukocytosis and new left thigh abscess. EXAM: 1. Removal of indwelling left psoas drain. 2. CT-guided aspiration of left iliacus abscess. 3. CT-guided aspiration and drain placement and left anterior thigh abscess. COMPARISON:  CT abdomen pelvis from earlier the same day MEDICATIONS: The patient is currently admitted to the hospital and receiving intravenous antibiotics. The antibiotics were administered within an appropriate time frame prior to the initiation of the procedure. ANESTHESIA/SEDATION: Moderate (conscious) sedation was employed during this procedure. A total of Versed 4 mg and Fentanyl 100 mcg was administered intravenously. Moderate Sedation Time: 30 minutes. The patient's level of consciousness and vital signs were monitored continuously by radiology nursing throughout the procedure under my direct supervision. CONTRAST:  None COMPLICATIONS: None immediate. PROCEDURE: Informed written consent was obtained from the patient after a discussion of the risks, benefits and alternatives to  treatment. The patient was placed supine on the CT gantry and a pre procedural CT was performed re-demonstrating the known abscess/fluid collection within the left iliacus muscle and left anterior thigh. The procedure was planned. A timeout was performed prior to the initiation of the procedure. Given findings on CT demonstrating resolution of the previously visualized psoas abscess, the left psoas drain was removed without complication. The left groin and left anterior thigh was prepped and draped in the usual sterile fashion. The overlying soft tissues were anesthetized with 1% lidocaine with epinephrine at each planned needle entry site. Appropriate  trajectory to aspirate the iliacus abscess was planned with the use of a 22 gauge spinal needle. A 15 cm, 18 gauge trocar needle was advanced into the abscess/fluid collection. Manual aspiration yielded approximately 20 mL of purulence fluid. The needle was removed. Hemostasis was obtained at the needle entry site with brief manual compression. Sterile bandage was applied. A small skin nick was made at the left anterolateral thigh needle entry site. A 10 cm, 18 gauge trocar needle was then directed under intermittent CT guidance to the left anterior thigh intramuscular abscess. A short Amplatz super stiff wire was coiled within the collection. Appropriate positioning was confirmed with a limited CT scan. The tract was serially dilated allowing placement of a 12 [place] all-purpose drainage catheter. Appropriate positioning was confirmed with a limited postprocedural CT scan. Approximately 10 ml of purulent fluid was aspirated. The tube was connected to a bulb suction and sutured in place. A dressing was placed. The patient tolerated the procedure well without immediate post procedural complication. IMPRESSION: 1. Removal of indwelling left psoas drain without complication. 2. Technically successful CT-guided aspiration of left iliacus intramuscular abscess yielding approximately 20 mL of purulent fluid. Sample was sent for culture. 3. Technically successful CT-guided aspiration and drain placement within left anterior thigh intramuscular abscess yielding approximately 10 mL of purulent fluid. The drain was connected to bulb suction. Marliss Coots, MD [facility]iology Electronically Signed   By: Marliss Coots MD   On: 10/03/2020 07:59   US Abdomen Limited  Result Date: 10/15/2020 CLINICAL DATA:  Evaluate for ascites. EXAM: LIMITED ABDOMEN ULTRASOUND FOR ASCITES TECHNIQUE: Limited ultrasound survey for ascites was performed in all four abdominal quadrants.  COMPARISON:  10/12/2020. FINDINGS: Four quadrant survey of the abdomen shows a small volume of ascites within the right upper quadrant, right lower quadrant and left lower quadrant of the abdomen. This appears decreased when compared with 10/12/2020. IMPRESSION: 1. Persistent but decreased volume of ascites as described above Electronically Signed   By: Signa Kell M.D.   On: 10/15/2020 13:06   US Abdomen Limited  Result Date: 10/12/2020 CLINICAL DATA:  Ascites. EXAM: LIMITED ABDOMEN ULTRASOUND FOR ASCITES TECHNIQUE: Limited ultrasound survey for ascites was performed in all four abdominal quadrants. COMPARISON:  October 09, 2020. FINDINGS: Minimal ascites is noted in the left upper quadrant. Mild ascites is noted around the liver in the right upper quadrant. Mild to moderate ascites is noted in both lower quadrants. IMPRESSION: Mild to moderate ascites is noted, with the largest amount seen in right pericolic gutter region. Electronically Signed   By: Lupita Raider M.D.   On: 10/12/2020 15:03   US Paracentesis  Result Date: 10/05/2020 INDICATION: Patient with history of septic arthritis, staph bacteremia, alcoholic cirrhosis, recurrent ascites. Request is made for therapeutic paracentesis. EXAM: ULTRASOUND GUIDED THERAPEUTIC PARACENTESIS MEDICATIONS: 10 mL 1% lidocaine COMPLICATIONS: None immediate. PROCEDURE: Informed written  consent was obtained from the patient after a discussion of the risks, benefits and alternatives to treatment. A timeout was performed prior to the initiation of the procedure. Initial ultrasound scanning demonstrates a moderate amount of ascites within the right lower abdominal quadrant. The right lower abdomen was prepped and draped in the usual sterile fashion. 1% lidocaine was used for local anesthesia. Following this, a 19 gauge, 7-cm, Yueh catheter was introduced. An ultrasound image was saved for documentation purposes. The paracentesis was performed. The catheter was  removed and a dressing was applied. The patient tolerated the procedure well without immediate post procedural complication. FINDINGS: A total of approximately 3.5 liters of pale yellow fluid was removed. IMPRESSION: Successful ultrasound-guided therapeutic paracentesis yielding 3.5 liters of peritoneal fluid. Read by: Loyce Dys PA-C Electronically Signed   By: Gilmer Mor D.O.   On: 10/05/2020 13:36   US Paracentesis  Result Date: 09/30/2020 INDICATION: Patient with history of septic arthritis of the left hip with prior iliopsoas and left proximal and medial thigh abscesses, COVID-19 in January 2022 ,staph bacteremia, alcoholic cirrhosis, ascites. Request received for diagnostic and therapeutic paracentesis up to 3 liters. EXAM: ULTRASOUND GUIDED DIAGNOSTIC AND THERAPEUTIC PARACENTESIS MEDICATIONS: 1% lidocaine to skin and subcutaneous tissue COMPLICATIONS: None immediate. PROCEDURE: Informed written consent was obtained from the patient after a discussion of the risks, benefits and alternatives to treatment. A timeout was performed prior to the initiation of the procedure. Initial ultrasound scanning demonstrates a moderate to large amount of ascites within the right mid to lower abdominal quadrant. The right mid to lower abdomen was prepped and draped in the usual sterile fashion. 1% lidocaine was used for local anesthesia. Following this, a 19 gauge, 10-cm, Yueh catheter was introduced. An ultrasound image was saved for documentation purposes. The paracentesis was performed. The catheter was removed and a dressing was applied. The patient tolerated the procedure well without immediate post procedural complication. FINDINGS: A total of approximately 3 liters of clear, light yellow fluid was removed. Samples were sent to the laboratory as requested by the clinical team. IMPRESSION: Successful ultrasound-guided diagnostic and therapeutic paracentesis yielding 3 liters of peritoneal fluid. Read by: Jeananne Rama, PA-C Electronically Signed   By: Marliss Coots MD   On: 09/30/2020 12:35   [place] ASCITES (ABDOMEN LIMITED)  Result Date: 10/09/2020 CLINICAL DATA:  History of alcoholic cirrhosis, recurrent symptomatic abdominal ascites, post paracentesis most recently 10/05/2020. EXAM: LIMITED ABDOMEN ULTRASOUND FOR ASCITES TECHNIQUE: Limited ultrasound survey for ascites was performed in all four abdominal quadrants. COMPARISON:  10/05/2020 FINDINGS: Small volume perihepatic and scattered abdominal ascites. No dominant pocket for safe therapeutic paracentesis. IMPRESSION: Small volume ascites.  Paracentesis deferred. Electronically Signed   By: Corlis Leak M.D.   On: 10/09/2020 14:36   US SCROTUM W/DOPPLER  Result Date: 10/20/2020 CLINICAL DATA:  Left testicle swelling EXAM: SCROTAL ULTRASOUND DOPPLER ULTRASOUND OF THE TESTICLES TECHNIQUE: Complete ultrasound examination of the testicles, epididymis, and other scrotal structures was performed. Color and spectral Doppler ultrasound were also utilized to evaluate blood flow to the testicles. COMPARISON:  09/27/2020 FINDINGS: Right testicle Measurements: 3.5 x 1.4 x 2.5 cm. No mass or microlithiasis visualized. Left testicle Measurements: 3 x 1.6 x 1.8 cm. No mass or microlithiasis visualized. Marked scrotal wall thickening and edema without focal fluid collection. Right epididymis:  Normal in size and appearance. Left epididymis:  Not well seen Hydrocele:  Trace right hydrocele. Varicocele:  None visualized. Pulsed Doppler interrogation of both testes demonstrates normal low resistance  arterial and venous waveforms bilaterally. IMPRESSION: 1. Negative for testicular torsion or mass. 2. Marked left scrotal wall thickening and edema without definitive focal fluid collection 3. Trace right hydrocele Electronically Signed   By: Jasmine Pang M.D.   On: 10/20/2020 20:17   DG HIP UNILAT WITH PELVIS 2-3 VIEWS LEFT  Result Date: 10/19/2020 CLINICAL DATA:  Hip replacement.  EXAM: DG HIP (WITH OR WITHOUT PELVIS) 2-3V LEFT COMPARISON:  10/04/2020.  CT 09/30/2020. FINDINGS: Interim removal of drainage catheter about the left hip. Soft tissue wound over the medial left thigh again noted. Left hip replacement again noted. No acute bony abnormality. No evidence of fracture or dislocation. IMPRESSION: 1. Interval removal of drainage catheter about the left hip. Persistent wound noted over the medial left thigh. 2.  Left hip replacement again noted.  No acute bony abnormality. Electronically Signed   By: Maisie Fus  Register   On: 10/19/2020 07:21   DG HIP UNILAT WITH PELVIS 2-3 VIEWS LEFT  Result Date: 10/04/2020 CLINICAL DATA:  Status post left hip replacement on September 15, 2020. EXAM: DG HIP (WITH OR WITHOUT PELVIS) 2-3V LEFT COMPARISON:  September 15, 2020 FINDINGS: There is no evidence of acute hip fracture or dislocation. A left hip replacement is seen without evidence of surrounding lucency to suggest the presence of hardware loosening or infection. There is no evidence of arthropathy or other focal bone abnormality. A solitary surgical drain is seen within soft tissues adjacent to the lateral aspect of the proximal left femoral shaft. Soft tissue swelling is seen along the lateral aspect of the left hip. Multiple radiopaque skin staples are also seen. IMPRESSION: 1. Status post left hip replacement without evidence of hardware loosening or infection. 2. Surgical drain positioning, as described above. Electronically Signed   By: Aram Candela M.D.   On: 10/04/2020 19:56   CT IMAGE GUIDED DRAINAGE BY PERCUTANEOUS CATHETER  Result Date: 10/03/2020 INDICATION: 35 year old male with history of MSSA bacteremia and left gluteal and thigh abscesses status post multiple drains. Presents with increasing leukocytosis and new left thigh abscess. EXAM: 1. Removal of indwelling left psoas drain. 2. CT-guided aspiration of left iliacus abscess. 3. CT-guided aspiration and drain placement and  left anterior thigh abscess. COMPARISON:  CT abdomen pelvis from earlier the same day MEDICATIONS: The patient is currently admitted to the hospital and receiving intravenous antibiotics. The antibiotics were administered within an appropriate time frame prior to the initiation of the procedure. ANESTHESIA/SEDATION: Moderate (conscious) sedation was employed during this procedure. A total of Versed 4 mg and Fentanyl 100 mcg was administered intravenously. Moderate Sedation Time: 30 minutes. The patient's level of consciousness and vital signs were monitored continuously by radiology nursing throughout the procedure under my direct supervision. CONTRAST:  None COMPLICATIONS: None immediate. PROCEDURE: Informed written consent was obtained from the patient after a discussion of the risks, benefits and alternatives to treatment. The patient was placed supine on the CT gantry and a pre procedural CT was performed re-demonstrating the known abscess/fluid collection within the left iliacus muscle and left anterior thigh. The procedure was planned. A timeout was performed prior to the initiation of the procedure. Given findings on CT demonstrating resolution of the previously visualized psoas abscess, the left psoas drain was removed without complication. The left groin and left anterior thigh was prepped and draped in the usual sterile fashion. The overlying soft tissues were anesthetized with 1% lidocaine with epinephrine at each planned needle entry site. Appropriate trajectory to aspirate the iliacus abscess  was planned with the use of a 22 gauge spinal needle. A 15 cm, 18 gauge trocar needle was advanced into the abscess/fluid collection. Manual aspiration yielded approximately 20 mL of purulence fluid. The needle was removed. Hemostasis was obtained at the needle entry site with brief manual compression. Sterile bandage was applied. A small skin nick was made at the left anterolateral thigh needle entry site. A 10  cm, 18 gauge trocar needle was then directed under intermittent CT guidance to the left anterior thigh intramuscular abscess. A short Amplatz super stiff wire was coiled within the collection. Appropriate positioning was confirmed with a limited CT scan. The tract was serially dilated allowing placement of a 12 [place] all-purpose drainage catheter. Appropriate positioning was confirmed with a limited postprocedural CT scan. Approximately 10 ml of purulent fluid was aspirated. The tube was connected to a bulb suction and sutured in place. A dressing was placed. The patient tolerated the procedure well without immediate post procedural complication. IMPRESSION: 1. Removal of indwelling left psoas drain without complication. 2. Technically successful CT-guided aspiration of left iliacus intramuscular abscess yielding approximately 20 mL of purulent fluid. Sample was sent for culture. 3. Technically successful CT-guided aspiration and drain placement within left anterior thigh intramuscular abscess yielding approximately 10 mL of purulent fluid. The drain was connected to bulb suction. Marliss Coots, MD [facility] Radiology Electronically Signed   By: Marliss Coots MD   On: 10/03/2020 07:59   VAS [place] LOWER EXTREMITY VENOUS (DVT)  Result Date: 09/28/2020  Lower Venous DVT Study Indications: Possible injury to blood vessels per order in chart.  Limitations: Bandages and poor ultrasound/tissue interface. Comparison Study: no previous exams Performing Technologist: Ernestene Mention  Examination Guidelines: A complete evaluation includes B-mode imaging, spectral Doppler, color Doppler, and power Doppler as needed of all accessible portions of each vessel. Bilateral testing is considered an integral part of a complete examination. Limited examinations for reoccurring indications may be performed as noted. The reflux portion of the exam is performed with the patient in reverse  Trendelenburg.  +-----+---------------+---------+-----------+----------+--------------+ RIGHTCompressibilityPhasicitySpontaneityPropertiesThrombus Aging +-----+---------------+---------+-----------+----------+--------------+ CFV  Full           Yes      Yes                                 +-----+---------------+---------+-----------+----------+--------------+   +---------+---------------+---------+-----------+----------+--------------+ LEFT     CompressibilityPhasicitySpontaneityPropertiesThrombus Aging +---------+---------------+---------+-----------+----------+--------------+ FV DistalFull           Yes      Yes                                 +---------+---------------+---------+-----------+----------+--------------+ POP      Full           Yes      Yes                                 +---------+---------------+---------+-----------+----------+--------------+ PTV      Full                                                        +---------+---------------+---------+-----------+----------+--------------+ PERO  Full                                                        +---------+---------------+---------+-----------+----------+--------------+   Left Technical Findings: Not visualized segments include CFV, PFV, Prox & Mid FV, SFJ. Patient just underwent I & D of large left thigh abscess yesterday. Bandaged from groin to distal thigh - unable to remove. Limited study.   Summary: RIGHT: - No evidence of common femoral vein obstruction.  LEFT: - There is no evidence of deep vein thrombosis in the lower extremity. However, portions of this examination were limited- see technologist comments above.  - No cystic structure found in the popliteal fossa.  *See table(s) above for measurements and observations. Electronically signed by Coral Else MD on 09/28/2020 at 10:06:38 PM.    Final    [place] EKG SITE RITE  Result Date: 10/08/2020 If Site Rite image not attached,  placement could not be confirmed due to current cardiac rhythm.  IR Paracentesis  Result Date: 10/24/2020 INDICATION: Patient with history of septic arthritis, staph bacteremia, alcoholic cirrhosis, recurrent ascites. Request received for therapeutic paracentesis. EXAM: ULTRASOUND GUIDED THERAPEUTIC PARACENTESIS MEDICATIONS: 1% lidocaine to skin and subcutaneous tissue COMPLICATIONS: None immediate. PROCEDURE: Informed written consent was obtained from the patient after a discussion of the risks, benefits and alternatives to treatment. A timeout was performed prior to the initiation of the procedure. Initial ultrasound scanning demonstrates a moderate amount of ascites within the right lower abdominal quadrant. The right lower abdomen was prepped and draped in the usual sterile fashion. 1% lidocaine was used for local anesthesia. Following this, a 19 gauge, 7-cm, Yueh catheter was introduced. An ultrasound image was saved for documentation purposes. The paracentesis was performed. The catheter was removed and a dressing was applied. The patient tolerated the procedure well without immediate post procedural complication. Patient received post-procedure intravenous albumin; see nursing notes for details. FINDINGS: A total of approximately 3.8 liters of yellow fluid was removed. IMPRESSION: Successful ultrasound-guided therapeutic paracentesis yielding 3.8 liters of peritoneal fluid. Read by: Jeananne Rama, PA-C Electronically Signed   By: Simonne Come M.D.   On: 10/24/2020 16:27   IR Paracentesis  Result Date: 10/17/2020 INDICATION: Patient with history of septic arthritis, staph bacteremia, alcoholic cirrhosis, abdominal distension, and recurrent ascites. Request is made for therapeutic paracentesis. EXAM: ULTRASOUND GUIDED THERAPEUTIC PARACENTESIS MEDICATIONS: 10 mL 1% lidocaine COMPLICATIONS: None immediate. PROCEDURE: Informed written consent was obtained from the patient after a discussion of the risks,  benefits and alternatives to treatment. A timeout was performed prior to the initiation of the procedure. Initial ultrasound scanning demonstrates a large amount of ascites within the right lower abdominal quadrant. The right lower abdomen was prepped and draped in the usual sterile fashion. 1% lidocaine was used for local anesthesia. Following this, a 19 gauge, 10-cm, Yueh catheter was introduced. An ultrasound image was saved for documentation purposes. The paracentesis was performed. The catheter was removed and a dressing was applied. The patient tolerated the procedure well without immediate post procedural complication. FINDINGS: A total of approximately 2 L of clear yellow fluid was removed. IMPRESSION: Successful ultrasound-guided paracentesis yielding 2 L of peritoneal fluid. Read by: Elwin Mocha, PA-C Electronically Signed   By: Acquanetta Belling M.D.   On: 10/17/2020 11:36   IR Paracentesis  Result Date: 10/12/2020 INDICATION:  Patient with abdominal distension, recurrent ascites. Request is made for therapeutic paracentesis. EXAM: ULTRASOUND GUIDED THERAPEUTIC PARACENTESIS MEDICATIONS: 10 mL 1% lidocaine COMPLICATIONS: None immediate. PROCEDURE: Informed written consent was obtained from the patient after a discussion of the risks, benefits and alternatives to treatment. A timeout was performed prior to the initiation of the procedure. Initial ultrasound scanning demonstrates a moderate amount of ascites within the right lower abdominal quadrant. The right lower abdomen was prepped and draped in the usual sterile fashion. 1% lidocaine was used for local anesthesia. Following this, a 19 gauge, 7-cm, Yueh catheter was introduced. An ultrasound image was saved for documentation purposes. The paracentesis was performed. The catheter was removed and a dressing was applied. The patient tolerated the procedure well without immediate post procedural complication. FINDINGS: A total of approximately 3.6 liters of  pale, thin fluid was removed. IMPRESSION: Successful ultrasound-guided therapeutic paracentesis yielding 3.6 liters of peritoneal fluid. Read by: Loyce Dys PA-C Electronically Signed   By: Irish Lack M.D.   On: 10/12/2020 17:17    Labs:  Basic Metabolic Panel: Recent Labs  Lab 10/24/20 0510 10/27/20 0520 10/27/20 0946  NA 135 132* 132*  K 3.3* 4.0 3.8  CL 102 102 100  CO2 22 24 26   GLUCOSE 90 91 99  BUN <5* 5* <5*  CREATININE <0.30* <0.30* 0.39*  CALCIUM 8.0* 8.3* 8.6*    CBC: Recent Labs  Lab 10/21/20 1815 10/24/20 0510  WBC 14.0* 9.9  NEUTROABS 9.6* 6.2  HGB 9.2* 8.6*  HCT 29.9* 27.3*  MCV 103.8* 101.1*  PLT 363 267    CBG: No results for input(s): GLUCAP in the last 168 hours.  Family history.  Negative for cirrhosis or diabetes or colon cancer  Brief HPI:   [name] is a 35 y.o. right-handed male with history significant for AVN of right hip and had scheduled plan for arthroplasty March 2022, chronic pain, hypertension, alcoholic hepatitis/cirrhosis with ascites as well as tobacco use and depression.  Recent admission for Covid, acute renal failure and alcoholic hepatitis 07/25/2020 to 08/29/2020.  Per chart review lives with spouse 1 level home.  Independent with assistive device.  Presented 09/13/2020 with increasing fatigue that progressed over 2 weeks.  As well as increasing left thigh and leg pain.  He did report he had a blister rupture on the inner thigh in the groin area.  Admission chemistry sodium 128 glucose 112 creatinine 0.59 lactic acid 3.3 hemoglobin 6.2 with latest hemoglobin 9.4 08/29/2020 blood culture staph aureus.  FOBT negative.  CT imaging showed left iliopsoas and left gluteal abscess with diffuse swelling and left hip effusion.  Patient started on broad-spectrum antibiotics.  Patient underwent left total hip arthroplasty with antibiotic spacer left hip excisional debridement of skin subcutaneous tissue muscle bone deep abscess 09/15/2020  per Dr. Dion Saucier.  Infectious disease consulted patient remained on antibiotic therapy TEE completed showing no PFO without masses or vegetation noted.  He later underwent left thigh excisional debridement of skin muscle fascia deep tissue abscess repositioning of percutaneous anterior pigtail catheter drain application of wound VAC 09/20/2020.  Hospital course complicated by left groin abscess with I&D of left groin abscess and change of wound VAC to left thigh 09/22/2020 per general surgery Dr. Luisa Hart followed by repeat I&D of left groin abscess and left thigh abscess 09/27/2020.  Lower extremity Dopplers completed 09/28/2020 showing no DVT.  Underwent ultrasound-guided diagnostic and therapeutic paracentesis 09/30/2020 as well as 10/05/2020 per interventional radiology yielding 3 L of clear  light yellow fluid.  He did have some initial difficulties in voiding Foley catheter tube inserted ultrasound completed concerning for soft tissue infection urology follow-up Dr. Laverle Patter evaluation appeared to be related to more regional edema from his leg and ankle infection.  There was no abscess noted on recent ultrasound.  Patient would remain on ceftezole and Flagyl and rifampin through 10/28/2020 per infectious disease.  Therapy evaluations completed and due to patient decreased functional mobility was admitted for a comprehensive rehab program   Hospital Course: CONTRELL BALLENTINE was admitted to rehab 10/10/2020 for inpatient therapies to consist of PT, ST and OT at least three hours five days a week. Past admission physiatrist, therapy team and rehab RN have worked together to provide customized collaborative inpatient rehab.  Pertaining to patient's debility septic shock left gluteal groin abscess septic arthritis with history of avascular necrosis left hip remained stable he was participating with therapies.  Venous Doppler studies lower extremities negative for DVT.  Pain managed with use of scheduled Neurontin 300 mg twice  daily, oxycodone as needed.  Patient did have a history of depression on Zoloft as directed emotional support provided follow-up by neuropsychology.  As noted with patient's history of AVN left hip status post left hip arthroplasty antibiotic spacer left hip excisional debridement skin subcutaneous tissue muscle bone deep abscess 09/15/2020 per Dr. Dion Saucier weightbearing as tolerated posterior hip precautions.  Left hip groin and left hip abscess status post multiple I&D's wound VAC as directed which is since been removed follow-up by general surgery infectious disease ceftezole as well as Flagyl completed 10/28/2020 patient remained afebrile would need follow-up with infectious disease.  Noted history of hepatic cirrhosis ascites multiple paracentesis GI recommends outpatient EGD latest paracentesis 10/12/2020.  Scrotal edema had greatly improved followed by urology services.  Anemia with thrombocytopenia latest hemoglobin 8.5 and monitored.  Blood pressure controlled on Lopressor, Aldactone as well as Lasix.  Patient did have a history of tobacco abuse receiving counseling regarding cessation of nicotine products.   Blood pressures were monitored on TID basis and soft and monitored     Rehab course: During patient's stay in rehab weekly team conferences were held to monitor patient's progress, set goals and discuss barriers to discharge. At admission, patient required minimal assist 5 feet rolling walker moderate assist sit to supine set up upper body bathing +2 physical assist lower body bathing set up upper body dressing total assist lower body dressing  Physical exam.  Blood pressure 106/67 pulse 99 temperature 98.4 respirations 16 oxygen saturation 95% room air Constitutional.  No acute distress HEENT Head.  Normocephalic and atraumatic Eyes.  Pupils round and reactive to light no discharge.nystagmus Neck.  Supple nontender no JVD without thyromegaly Cardiac regular rate rhythm not extra sounds or  murmur heard Abdomen.  Soft nontender positive bowel sounds without rebound Skin.  Left hip incision groin site dressed GU.  Significant scrotal edema Neurologic.  Alert oriented x3 5/5 strength in bilateral upper extremities and right lower extremity and 2/5 in left lower extremity limited by pain  He/She  has had improvement in activity tolerance, balance, postural control as well as ability to compensate for deficits. He/She has had improvement in functional use RUE/LUE  and RLE/LLE as well as improvement in awareness.  Transferred sit to stand contact-guard assist rolling walker gait training in therapy gym with rolling walker 110 feet contact-guard assist.  Maintains weightbearing precautions and hip precautions.  He worked on transitions from supine to sit and sit  to supine needing some increased time to complete.  He was able to use effectively in transfer from supine to sitting on left side of bed with supervision as well as transitioning back to supine.  Patient still needing some assist with lower body dressing but overall made excellent progress.  Family teaching completed and discharged home       Disposition: Discharged to home    Diet: Regular consistency 2 g sodium  Special Instructions: No driving smoking or alcohol  Weightbearing as tolerated posterior total hip precautions  Moistened gauze to inner groin wounds change daily as needed  Apply a size appropriate piece of Xeroform into the left upper thigh wound, cover with dry gauze tape in place  Left hip dressing change twice daily with gauze and ABDs  Follow-up gastroenterology Dr.Nandigam [phone] for ongoing evaluation and care of cirrhosis/ascites evaluation for paracentesis of which patient had received multiple while in the hospital.  Medications at discharge 1.  Tylenol as needed 2.  Colace 100 mg p.o. twice daily 3.  Folic acid 1 mg p.o. daily 4.  Lasix 40 mg p.o. daily 5.  Neurontin 300 mg p.o. twice  daily 6.  Chronulac 10 mg p.o. twice daily 7.  Lopressor 50 mg p.o. twice daily 8.  Multivitamin daily 9.  Oxycodone 5 mg every 6 hours as needed pain 10.  Protonix 40 mg p.o. twice daily 11.  Aldactone 100 mg p.o. daily 12.  Zoloft 50 mg p.o. daily  30-35 minutes were spent completing discharge summary and discharge planning     Follow-up Information    Kirsteins, Victorino Sparrow, MD Follow up.   Specialty: Physical Medicine and Rehabilitation Why: No follow-up needed Contact information: [address] [place] [address] [phone]        Harriette Bouillon, MD Follow up.   Specialty: General Surgery Why: Call for appointment Contact information: [address] [place] [address] [phone]        Teryl Lucy, MD Follow up in 2 week(s).   Specialty: Orthopedic Surgery Why: Call for appointment Contact information: [address] [place] [address] [phone]        Napoleon Form, MD Follow up.   Specialty: Gastroenterology Why: Follow-up 1 week for cirrhosis/ascites ongoing evaluation for possible need for paracentesis of which patient had multiple well while in the hospital Contact information: [address] [place] [address] [phone]               Signed: Mcarthur Rossetti Chandell Attridge 10/28/2020, 5:11 AM

## 2020-10-26 NOTE — Progress Notes (Signed)
Patient ID: [name], male   DOB: March 26, 1986, 35 y.o.   MRN: [id number] Team Conference Report to Patient/Family  Team Conference discussion was reviewed with the patient and caregiver, including goals, any changes in plan of care and target discharge date.  Patient and caregiver express understanding and are in agreement.  The patient has a target discharge date of 10/28/20.  Sw met with pt. Provided conference progress and d/c information. Pt will d/c Friday or Saturday after last IV ABX dosage. Clive set with [facility] at home. DME ordered.  SW will continue to follow up   Dyanne Iha 10/26/2020, 1:30 PM

## 2020-10-26 NOTE — Progress Notes (Signed)
Occupational Therapy Session Note  Patient Details  Name: [name] MRN: [id number] Date of Birth: 17-Sep-1985  Today's Date: 10/26/2020 OT Individual Time: 1005-1029 OT Individual Time Calculation (min): 24 min    Short Term Goals: Week 2:  OT Short Term Goal 1 (Week 2): Continue working on established LTGs set a min guard to supervision.  Skilled Therapeutic Interventions/Progress Updates:    Session 1: (1005-1029)  Pt up in wheelchair to start session.  Worked on BUE light therex strengthening exercises from the wheelchair.  He was able to complete 2 sets of 15 reps for external rotation, horizontal abduction, triceps extension, and seated row.  He also completed 1 set of 10 reps for scapular adduction AROM.  Mod instructional cueing for technique to complete these tasks.  Finished session with pt in the wheelchair with the call button and phone in reach.    Session 2: (1304-1405)  Pt completed bathing and dressing sit to stand during session.  Supervision for supine to sit EOB in preparation for transfer to the wheelchair.  He then completed transfer stand pivot with min guard using the RW.  UB bathing at supervision level with LB bathing at min guard sit to stand.  Min assist for removal and donning of his crocs with total assist for removal of TEDs.  He transferred back to bed for re-application of one of his wound sites on the left inner thigh.  He then transferred back to the wheelchair at min guard assist to conclude session.  Call button and phone in reach at end of session with his girlfriend present.     Therapy Documentation Precautions:  Precautions Precautions: Fall,Posterior Hip,Other (comment) Precaution Comments: foley, wound vac L thigh, L JP drains L thigh Restrictions Weight Bearing Restrictions: No LLE Weight Bearing: Weight bearing as tolerated Other Position/Activity Restrictions: 3 JP drains in Lt anterior thigh and wound vac  Pain: Pain Assessment Pain  Scale: 0-10 Pain Score: 5  Faces Pain Scale: Hurts a little bit Pain Type: Acute pain Pain Location: Leg Pain Orientation: Left Pain Descriptors / Indicators: Discomfort Pain Onset: With Activity Pain Intervention(s): Repositioned ADL: See Care Tool Section for some details of mobility and selfcare  Therapy/Group: Individual Therapy  Yanissa Michalsky OTR/L 10/26/2020, 10:54 AM

## 2020-10-26 NOTE — Progress Notes (Signed)
Patient ID: [name], male   DOB: Dec 14, 1985, 35 y.o.   MRN: [id number]   Pt declined by [facility] due to not accepting insurance provider any longer.   [place], [place] [phone]

## 2020-10-27 ENCOUNTER — Other Ambulatory Visit (HOSPITAL_COMMUNITY): Payer: Self-pay

## 2020-10-27 LAB — BASIC METABOLIC PANEL
Anion gap: 6 (ref 5–15)
Anion gap: 6 (ref 5–15)
BUN: 5 mg/dL — ABNORMAL LOW (ref 6–20)
BUN: <5 mg/dL — ABNORMAL LOW (ref 6–20)
CO2: 24 mmol/L (ref 22–32)
CO2: 26 mmol/L (ref 22–32)
Calcium: 8.3 mg/dL — ABNORMAL LOW (ref 8.9–10.3)
Calcium: 8.6 mg/dL — ABNORMAL LOW (ref 8.9–10.3)
Chloride: 102 mmol/L (ref 98–111)
Chloride: 100 mmol/L (ref 98–111)
Creatinine, Ser: <0.3 mg/dL — ABNORMAL LOW (ref 0.61–1.24)
Creatinine, Ser: 0.39 mg/dL — ABNORMAL LOW (ref 0.61–1.24)
GFR, Estimated: >60 mL/min (ref >60)
Glucose, Bld: 91 mg/dL (ref 70–99)
Glucose, Bld: 99 mg/dL (ref 70–99)
Potassium: 4 mmol/L (ref 3.5–5.1)
Potassium: 3.8 mmol/L (ref 3.5–5.1)
Sodium: 132 mmol/L — ABNORMAL LOW (ref 135–145)
Sodium: 132 mmol/L — ABNORMAL LOW (ref 135–145)

## 2020-10-27 MED ORDER — PANTOPRAZOLE SODIUM 40 MG PO TBEC
40.0000 mg | DELAYED_RELEASE_TABLET | Freq: Two times a day (BID) | ORAL | 0 refills | Status: DC
  Filled 2020-10-27: qty 60, 30d supply, fill #0

## 2020-10-27 MED ORDER — SPIRONOLACTONE 100 MG PO TABS
100.0000 mg | ORAL_TABLET | Freq: Every day | ORAL | 0 refills | Status: DC
  Filled 2020-10-27: qty 30, 30d supply, fill #0

## 2020-10-27 MED ORDER — FOLIC ACID 1 MG PO TABS
1.0000 mg | ORAL_TABLET | Freq: Every day | ORAL | 0 refills | Status: AC
  Filled 2020-10-27: qty 30, 30d supply, fill #0

## 2020-10-27 MED ORDER — LACTULOSE ENCEPHALOPATHY 10 GM/15ML PO SOLN
10.0000 g | Freq: Two times a day (BID) | ORAL | 0 refills | Status: DC
  Filled 2020-10-27: qty 236, 8d supply, fill #0

## 2020-10-27 MED ORDER — OXYCODONE HCL 5 MG PO TABS
5.0000 mg | ORAL_TABLET | Freq: Four times a day (QID) | ORAL | 0 refills | Status: DC | PRN
  Filled 2020-10-27: qty 30, 7d supply, fill #0

## 2020-10-27 MED ORDER — FUROSEMIDE 40 MG PO TABS
40.0000 mg | ORAL_TABLET | Freq: Every day | ORAL | 0 refills | Status: DC
  Filled 2020-10-27: qty 30, 30d supply, fill #0

## 2020-10-27 MED ORDER — METOPROLOL TARTRATE 50 MG PO TABS
50.0000 mg | ORAL_TABLET | Freq: Two times a day (BID) | ORAL | 0 refills | Status: AC
  Filled 2020-10-27: qty 60, 30d supply, fill #0

## 2020-10-27 MED ORDER — ACETAMINOPHEN 325 MG PO TABS: 650.0000 mg | ORAL_TABLET | Freq: Four times a day (QID) | ORAL | Status: AC | PRN

## 2020-10-27 MED ORDER — GABAPENTIN 600 MG PO TABS
300.0000 mg | ORAL_TABLET | Freq: Two times a day (BID) | ORAL | 0 refills | Status: DC
  Filled 2020-10-27: qty 60, 60d supply, fill #0

## 2020-10-27 MED ORDER — POTASSIUM CHLORIDE CRYS ER 10 MEQ PO TBCR
10.0000 meq | EXTENDED_RELEASE_TABLET | Freq: Every day | ORAL | 0 refills | Status: DC
  Filled 2020-10-27: qty 30, 30d supply, fill #0

## 2020-10-27 MED ORDER — SERTRALINE HCL 50 MG PO TABS
50.0000 mg | ORAL_TABLET | Freq: Every day | ORAL | 0 refills | Status: AC
  Filled 2020-10-27: qty 30, 30d supply, fill #0

## 2020-10-27 NOTE — Progress Notes (Signed)
Wife in for family teaching on wound care and dressing changes.  Wife demonstrated correctly, and verbalized understanding.

## 2020-10-27 NOTE — Progress Notes (Signed)
Occupational Therapy Discharge Summary  Patient Details  Name: [name] MRN: [id number] Date of Birth: March 26, 1986  Today's Date: 10/27/2020 OT Individual Time: 1420-1505 OT Individual Time Calculation (min): 45 min   Session Note:  Pt in bed to start session agreeable to working in therapy.  He was able to transition to sitting with supervision and then complete stand pivot transfer to the wheelchair at the same level with use of the RW for support.  Therapist then placed 4" step in front of his bed and raised bed up to simulate home setup.  He was then able to step back onto the step with supervision using the RW and sit on the bed.  He also stepped down off of the step at the same level, however he could get off of the bed at the same height without the step safely as well.  Provided handout and instruction on theraband exercises for BUEs with emphasis on rotator cuff strengthening as well therapy putty exercises.  Pt is familiar with all exercises from previous sessions.  Finished session with completion of wheelchair mobility down to the dayroom where he sat in the wheelchair and worked on LLE strengthening exercises.  He completed 2 sets of 15 repetitions for left knee extension.  He also completed 2 sets of 10 reps with max facilitation for hip flexion from seated position for approximately 20 degrees.  Also had him kick a ball that was rolled to him for 20 repetitions.  Returned to the room at the end of the session with the call button and phone in reach and safety belt in place.    Patient has met 9 of 9 long term goals due to improved balance and ability to compensate for deficits.  Patient to discharge at overall Supervision level.  Patient's care partner is independent to provide the necessary physical assistance at discharge.    Reasons goals not met: NA  Recommendation:  Patient will benefit from ongoing skilled OT services in home health setting to continue to advance functional  skills in the area of BADL, iADL and Reduce care partner burden.  Pt continues to be at a supervision level for selfcare tasks and functional transfers.  Feel he will benefit from continued HHOT to further progress to a modified independent level.  Equipment: No equipment provided  Reasons for discharge: treatment goals met and discharge from hospital  Patient/family agrees with progress made and goals achieved: Yes  OT Discharge Precautions/Restrictions  Precautions Precautions: Fall;Posterior Hip;Other (comment) Restrictions Weight Bearing Restrictions: No LLE Weight Bearing: Weight bearing as tolerated   Pain Pain Assessment Pain Scale: 0-10 Pain Score: 5  Pain Type: Chronic pain;Surgical pain Pain Location: Hip Pain Orientation: Left Pain Descriptors / Indicators: Aching Pain Frequency: Constant Pain Onset: On-going Patients Stated Pain Goal: 1 Pain Intervention(s): Medication (See eMAR);Repositioned ADL ADL Eating: Independent Where Assessed-Eating: Wheelchair Grooming: Independent Where Assessed-Grooming: Wheelchair Upper Body Bathing: Setup Where Assessed-Upper Body Bathing: Wheelchair Lower Body Bathing: Supervision/safety Where Assessed-Lower Body Bathing: Wheelchair Upper Body Dressing: Setup Where Assessed-Upper Body Dressing: Wheelchair Lower Body Dressing: Supervision/safety Where Assessed-Lower Body Dressing: Wheelchair Toileting: Supervision/safety Where Assessed-Toileting: Bedside Commode Toilet Transfer: Close supervision Toilet Transfer Method: Counselling psychologist: Radiographer, therapeutic: Not assessed Social research officer, government: Close supervision Social research officer, government Method: Heritage manager: Grab bars,Shower seat with back Vision Baseline Vision/History: No visual deficits Patient Visual Report: No change from baseline Vision Assessment?: No apparent visual deficits Perception  Perception:  Within Functional  Limits Praxis Praxis: Intact Cognition Overall Cognitive Status: Within Functional Limits for tasks assessed Arousal/Alertness: Awake/alert Attention: Sustained Sustained Attention: Appears intact Memory: Appears intact Awareness: Appears intact Problem Solving: Appears intact Safety/Judgment: Appears intact Sensation Sensation Light Touch: Appears Intact Hot/Cold: Appears Intact Proprioception: Appears Intact Stereognosis: Appears Intact Additional Comments: Sensation intact in BUEs Coordination Gross Motor Movements are Fluid and Coordinated: Yes Fine Motor Movements are Fluid and Coordinated: Yes Coordination and Movement Description: Gross and FM coordination WFLS in [place] Motor  Motor Motor: Other (comment) (Generalized weakness) Motor - Discharge Observations: generalized weakness and increased left hip pain Mobility  Bed Mobility Bed Mobility: Supine to Sit;Sit to Supine Supine to Sit: Supervision/Verbal cueing Sit to Supine: Supervision/Verbal cueing Transfers Sit to Stand: Supervision/Verbal cueing Stand to Sit: Supervision/Verbal cueing  Trunk/Postural Assessment  Cervical Assessment Cervical Assessment: Within Functional Limits Thoracic Assessment Thoracic Assessment: Within Functional Limits Lumbar Assessment Lumbar Assessment: Within Functional Limits  Balance Balance Balance Assessed: Yes Static Sitting Balance Static Sitting - Balance Support: No upper extremity supported Static Sitting - Level of Assistance: 6: Modified independent (Device/Increase time) Dynamic Sitting Balance Dynamic Sitting - Balance Support: Feet supported;During functional activity Dynamic Sitting - Level of Assistance: 6: Modified independent (Device/Increase time) Static Standing Balance Static Standing - Balance Support: During functional activity Static Standing - Level of Assistance: 5: Stand by assistance Dynamic Standing Balance Dynamic Standing -  Balance Support: During functional activity;Bilateral upper extremity supported Dynamic Standing - Level of Assistance: 5: Stand by assistance Extremity/Trunk Assessment RUE Assessment RUE Assessment: Exceptions to [facility] Active Range of Motion (AROM) Comments: shoulder flexion 0-150 degrees grossly with history of shoulder impingement General Strength Comments: shoulder flexion 3+/5 as well as grip,  Elbow flexion/extension 4/5 LUE Assessment LUE Assessment: Within Functional Limits General Strength Comments: strength 4+/5 throughout   [place] OTR/L 10/27/2020, 5:30 PM

## 2020-10-27 NOTE — Progress Notes (Signed)
Physical Therapy Discharge Summary  Patient Details  Name: [name] MRN: [id number] Date of Birth: 1986-02-12  Today's Date: 10/27/2020 PT Individual Time: 8756-4332 PT Individual Time Calculation (min): 55 min    Patient has met 10 of 11 long term goals due to improved activity tolerance, improved balance, improved postural control, increased strength, increased range of motion and ability to compensate for deficits.  Patient to discharge at an ambulatory level Supervision for household distances using RW, and wheelchair level in the community.   Patient's care partner is independent to provide the necessary physical assistance at discharge.  Reasons goals not met: Pt will have ramp available to enter home and hence will not need to negotiate stairs.  Recommendation:  Patient will benefit from ongoing skilled PT services in home health setting to continue to advance safe functional mobility, address ongoing impairments in LLE range of motion (particularly L knee and hip flex) and strength, gait, and minimize fall risk.  Equipment: 18x18 standard WC with cushion, elevating leg rests, anti tippers; RW  Reasons for discharge: treatment goals met  Patient/family agrees with progress made and goals achieved: Yes   Skilled Therapeutic Interventions/Progress Updates: Pt received supine in bed with girlfriend in room. Pt reported 2/10 L hip pain (RN came later in session with pain meds), but agreeable to therapy. Therapist asked pt and girlfriend if they had any initial questions; they did not. Pt and girlfriend re-educated on L posteroir hip precautions, HEP, follow-up home health PT, and home WC and RW equipment. RN entered room to administer medications. RN left and therapist assumed pt care. Supine to sitting EOB with supervision. Sit to stand CGA from elevated bed performed to simulate home environment using RW. Gait training with verbal instruction to "walk as far as you can" with +2  WC follow for safety. Pt demonstrates L hip hiking to advance RLE. Able to ambulate 142f CGA with RW. Wheeled to therapy gym for time management and energy conservation. Gait training up and down ramp CGA RW to simulate entry into home; ~15 total feet. MMT performed; generalized LLE weakness (particularly L hip). Wheelchair mobility performed independently (without verbal cuing needed) back to room ~3050f Sit to stand CGA RW and walk back to sitting EOB CGA RW. Sitting EOB to supine with supervision using leg lifter for LLE. Pt did not want to perform car transfer to limit pain, but education provided in accordance with observations from session earlier in the week. Education that low car transfer should be avoided if possible for the time being in order to not break L posterior hip precautions, and pt and girlfriend agreed they will use their truck. Therapist asked pt and girlfriend about any final concerns and/or questions; they did not report any. Pt left supine in bed with bed alarm turned on, needs within reach, and girlfriend in room.  PT Discharge Precautions/Restrictions Precautions Precautions: Fall;Posterior Hip;Other (comment) (L hip) Restrictions Weight Bearing Restrictions: No LLE Weight Bearing: Weight bearing as tolerated Pain Pain Assessment Pain Scale: 0-10 Pain Score: 2  Pain Type: Chronic pain;Surgical pain Pain Location: Hip Pain Orientation: Left Pain Descriptors / Indicators: Aching Pain Frequency: Constant Pain Onset: On-going Patients Stated Pain Goal: 1 Pain Intervention(s): Medication (See eMAR) Perception  Perception Perception: Within Functional Limits Praxis Praxis: Intact  Cognition Overall Cognitive Status: Within Functional Limits for tasks assessed Arousal/Alertness: Awake/alert Orientation Level: Oriented X4 Attention: Sustained Sustained Attention: Appears intact Memory: Appears intact Awareness: Appears intact Problem Solving: Appears  intact Safety/Judgment:  Appears intact Sensation Sensation Light Touch: Impaired by gross assessment Peripheral sensation comments: Light touch not fully present below L knee, but crude touch is present. Light Touch Impaired Details: Impaired LLE Hot/Cold: Appears Intact Proprioception: Appears Intact Stereognosis: Appears Intact Additional Comments: Sensation intact in BUEs Coordination Gross Motor Movements are Fluid and Coordinated: Yes Fine Motor Movements are Fluid and Coordinated: Yes Coordination and Movement Description: Limits LLE WB due to pain (evident in sit to stand) Motor  Motor Motor: Other (comment) (Generalized LE weakness (L>R)) Motor - Discharge Observations: Generalized LE weakness (L>R). Avoids LLE WB due to pain.  Mobility Bed Mobility Bed Mobility: Supine to Sit;Sit to Supine Supine to Sit: Supervision/Verbal cueing Sit to Supine: Supervision/Verbal cueing (LLE leg lifter) Transfers Transfers: Transfer;Stand Pivot Transfers;Stand to Sit;Sit to Stand Sit to Stand: Contact Guard/Touching assist (RW) Stand to Sit: Supervision/Verbal cueing Stand Pivot Transfers: Contact Guard/Touching assist Transfer (Assistive device): Rolling walker Locomotion  Gait Ambulation: Yes Gait Assistance: Supervision/Verbal cueing;Contact Guard/Touching assist (CGA for long distances due to fatigue and increased pain) Gait Distance (Feet): 188 Feet Assistive device: Rolling walker Gait Gait: Yes Gait Pattern: Impaired Gait Pattern: Step-through pattern;Left hip hike Gait velocity: decreased but significantly improved from eval Stairs / Additional Locomotion Stairs: No Wheelchair Mobility Wheelchair Mobility: Yes Wheelchair Assistance: Independent with Camera operator: Both upper extremities Wheelchair Parts Management: Supervision/cueing Distance: 349f  Trunk/Postural Assessment  Cervical Assessment Cervical Assessment: Within Functional  Limits Thoracic Assessment Thoracic Assessment: Within Functional Limits Lumbar Assessment Lumbar Assessment: Within Functional Limits  Balance Balance Balance Assessed: Yes Static Sitting Balance Static Sitting - Balance Support: No upper extremity supported Static Sitting - Level of Assistance: 7: Independent Dynamic Sitting Balance Dynamic Sitting - Balance Support: Feet supported;During functional activity Dynamic Sitting - Level of Assistance: 6: Modified independent (Device/Increase time) Static Standing Balance Static Standing - Balance Support: During functional activity Static Standing - Level of Assistance: 5: Stand by assistance Dynamic Standing Balance Dynamic Standing - Balance Support: During functional activity;Bilateral upper extremity supported Dynamic Standing - Level of Assistance: 5: Stand by assistance (CGA) Extremity Assessment  RLE Assessment RLE Assessment: Exceptions to [facility] Strength Comments: Decreased (primarily in hip), but significantly greater strength than LLE RLE Strength Right Hip Flexion: 3+/5 Right Knee Flexion: 4/5 Right Knee Extension: 4/5 Right Ankle Dorsiflexion: 4/5 Right Ankle Plantar Flexion: 5/5 LLE Assessment LLE Assessment: Exceptions to [facility]ive Range of Motion (PROM) Comments: Limited L abduction and knee flex (max = ~110 degrees) Active Range of Motion (AROM) Comments: very limited L abduction and knee flex without assistance due to pain and surgical sites LLE Strength LLE Overall Strength Comments: Generalized weakness (most significantly in hip) Left Hip Flexion: 2/5 Left Knee Flexion: 4-/5 Left Knee Extension: 4-/5 Left Ankle Dorsiflexion: 3+/5 Left Ankle Plantar Flexion: 4+/5    KEleonore Chiquito SPT 10/27/2020, 7:07 PM

## 2020-10-27 NOTE — Discharge Instructions (Signed)
Inpatient Rehab Discharge Instructions  [name] Discharge date and time: No discharge date for patient encounter.   Activities/Precautions/ Functional Status: Activity: Weightbearing as tolerated with posterior hip precautions Diet: 2 g sodium Wound Care: Routine skin checks Functional status:  ___ No restrictions     ___ Walk up steps independently ___ 24/7 supervision/assistance   ___ Walk up steps with assistance ___ Intermittent supervision/assistance  ___ Bathe/dress independently ___ Walk with walker     _x__ Bathe/dress with assistance ___ Walk Independently    ___ Shower independently ___ Walk with assistance    ___ Shower with assistance ___ No alcohol     ___ Return to work/school ________  COMMUNITY REFERRALS UPON DISCHARGE:    Home Health:   PT     OT     RN                    Agency: [facility] (Formally: [facility]) Phone: [phone]    Medical Equipment/Items Ordered: Wheelchair, 3N1 Bedside Commode                                                 Agency/Supplier: [employer]   Special Instructions:  No driving smoking or alcohol  Moistened gauze to inner groin wound change daily as needed  Apply a size appropriate piece of Xeroform into the left upper thigh wound, cover with dry gauze tape in place and change daily as needed  Left hip dressings changed twice daily with gauze and ABDs  My questions have been answered and I understand these instructions. I will adhere to these goals and the provided educational materials after my discharge from the hospital.  Patient/Caregiver Signature _______________________________ Date __________  Clinician Signature _______________________________________ Date __________  Please bring this form and your medication list with you to all your follow-up doctor's appointments.

## 2020-10-27 NOTE — Progress Notes (Signed)
Inpatient Rehabilitation Care Coordinator Discharge Note  The overall goal for the admission was met for:   Discharge location: Yes, home  Length of Stay: Yes, 18 days  Discharge activity level: Yes  Home/community participation: Yes  Services provided included: MD, RD, PT, OT, SLP, RN, CM, TR, Pharmacy, Neuropsych and SW  Financial Services: Private Insurance: [employer] offered to/list presented to:pt and spouse  Follow-up services arranged: Home Health: [facility] ([facility])  Comments (or additional information): PT OT [facility] Wheelchair and Bedside Commode  Patient/Family verbalized understanding of follow-up arrangements: Yes  Individual responsible for coordination of the follow-up plan: Vaughan Basta [phone]  Confirmed correct DME delivered: Dyanne Iha 10/27/2020    Dyanne Iha

## 2020-10-27 NOTE — Progress Notes (Signed)
Occupational Therapy Session Note  Patient Details  Name: [name] MRN: [id number] Date of Birth: 10-15-1985  Today's Date: 10/27/2020 OT Individual Time: 1093-2355 OT Individual Time Calculation (min): 45 min    Short Term Goals: Week 2:  OT Short Term Goal 1 (Week 2): Continue working on established LTGs set a min guard to supervision.  Skilled Therapeutic Interventions/Progress Updates:    Session 1: (7322-0254)  Pt in wheelchair with girlfriend present.  Worked on UB and LB dressing with use of AE during session.  He was able to complete donning his pants and his slip on crocs with increased time and supervision sit to stand with the reacher.  He was then taken down to the therapy gym where he was educated on bilateral hand strengthening exercises with use of the therapy putty.  Medium resistance putty was issued with pt completing gross grasp strengthening as well as tip to tip pinch for multiple repetitions.  Instructed pt to work on completing these exercises at home for 20 mins 3 times a week.  Handout to be provided.  Finished session with return to the room and pt staying up in the wheelchair with call button and phone in reach.    Session 2: [phone])  Pt up in wheelchair to start, worked on washing his hair at the sink using adaptive board.  Therapist assisted with washing and rinsing while therapist and pt's significant other held the board.  He was then able to assist with drying his hair.  Also discussed and demonstrated appropriate method for walk in shower transfer with significant other and pt based on their home setup.  Provided education on placement of grab bars and orientation of seat as well.  Next, took pt propelled himself down to the dayroom with modified independence.  He then worked on sit to stand and standing balance with use of the RW while engaged in Wii activity.  He was able to complete sit to stand with supervision multiple times and maintain standing  balance while using the UE for activity.  Standing endurance for up to 10 mins statically.  Also had him work on stepping over a 2-3" barrier forward and backwards with increased time and supervision to simulate shower edge.  Returned to the room at the end of the session with transfer to the bed at supervision level as well.  He utilized the leg lifter for lifting the LLE into the bed.  Call button and phone in reach with safety alarm in place.  Significant other also in the room.    Therapy Documentation Precautions:  Precautions Precautions: Fall,Posterior Hip,Other (comment) Precaution Comments: foley, wound vac L thigh, L JP drains L thigh Restrictions Weight Bearing Restrictions: No LLE Weight Bearing: Weight bearing as tolerated Other Position/Activity Restrictions: 3 JP drains in Lt anterior thigh and wound vac Pain: Pain Assessment Pain Scale: 0-10 Pain Score: 2  Faces Pain Scale: Hurts little more Pain Type: Acute pain Pain Location: Hip Pain Orientation: Left Pain Descriptors / Indicators: Aching;Discomfort Pain Frequency: Constant Pain Onset: With Activity Patients Stated Pain Goal: 2 Pain Intervention(s): Repositioned ADL: See Care Tool Section for some details of mobility and selfcare  Therapy/Group: Individual Therapy  Amedio Bowlby OTR/L 10/27/2020, 12:35 PM

## 2020-10-27 NOTE — Progress Notes (Signed)
Wound dressings chngd, pt tolerated well. Call bell left w/ in reach.

## 2020-10-28 NOTE — Progress Notes (Signed)
Per MD order, PICC line removed. Cath intact at 40cm. Vaseline pressure gauze to site, pressure held x . No bleeding to site. Pt instructed to keep dressing CDI x 24 hours. Avoid heavy lifting, pushing or pulling x 24 hours,  If bleeding occurs hold pressure, if bleeding does not stop contact MD or go to the ED. Pt does not have any questions. Informed to stay in the bed x Dewain Penning M

## 2020-10-28 NOTE — Progress Notes (Signed)
INPATIENT REHABILITATION DISCHARGE NOTE   Discharge instructions by: Harvel Ricks PA-C   Verbalized understanding: yes   Skin care/Wound care: wife and patient trained on wound care dressings   Pain: no c/o pain   IV's PICC line removed before discharge   Tubes/Drains: none   Safety instructions: given to wife   Patient belongings: all belongings taken with wife prior to D/C   Discharged to: home   Discharged via: wheelchair    Notes:

## 2020-10-28 NOTE — Progress Notes (Signed)
PROGRESS NOTE   Subjective/Complaints:  ROS:   Pt denies SOB, CP, N/V/C/D, and vision changes  Objective:   No results found. No results for input(s): WBC, HGB, HCT, PLT in the last 72 hours. Recent Labs    10/27/20 0520 10/27/20 0946  NA 132* 132*  K 4.0 3.8  CL 102 100  CO2 24 26  GLUCOSE 91 99  BUN 5* <5*  CREATININE <0.30* 0.39*  CALCIUM 8.3* 8.6*    Intake/Output Summary (Last 24 hours) at 10/28/2020 0834 Last data filed at 10/28/2020 0754 Gross per 24 hour  Intake 580 ml  Output 2450 ml  Net -1870 ml        Physical Exam: Vital Signs Blood pressure (!) 125/91, pulse 100, temperature 98.9 F (37.2 C), temperature source Oral, resp. rate 18, height 5\' 9"  (1.753 m), weight 75.7 kg, SpO2 96 %.  General: No acute distress Mood and affect are appropriate Heart: Regular rate and rhythm no rubs murmurs or extra sounds Lungs: Clear to auscultation, breathing unlabored, no rales or wheezes Abdomen: Positive bowel sounds, soft nontender to palpation, nondistended Extremities: No clubbing, cyanosis, or edema  Musc: Bilateral lower extremity edema, no pain to palpation left ant and post thigh  Bilateral upper extremities: 5/5 proximal distal Right lower extremity: 4/5 proximal distally left lower extremity: Hip flexion, knee extension 2+/5 (pain inhibition)   Assessment/Plan: 1. Functional deficits septic arthritis Left hip Stable for D/C today F/u PCP in 3-4 weeks F/u ortho 1-2 weeks F/u GI 1 wk See D/C summary See D/C instructions  Care Tool:  Bathing    Body parts bathed by patient: Right arm,Left arm,Chest,Abdomen,Right upper leg,Left upper leg,Right lower leg,Left lower leg,Face,Front perineal area,Buttocks   Body parts bathed by helper: Front perineal area,Left upper leg Body parts n/a: Front perineal area,Buttocks,Left upper leg   Bathing assist Assist Level: Supervision/Verbal cueing      Upper Body Dressing/Undressing Upper body dressing   What is the patient wearing?: Pull over shirt    Upper body assist Assist Level: Set up assist    Lower Body Dressing/Undressing Lower body dressing    Lower body dressing activity did not occur: Safety/medical concerns (Patient with multiple drains and wound vac) What is the patient wearing?: Pants     Lower body assist Assist for lower body dressing: Supervision/Verbal cueing     Toileting Toileting    Toileting assist Assist for toileting: Supervision/Verbal cueing     Transfers Chair/bed transfer  Transfers assist     Chair/bed transfer assist level: Contact Guard/Touching assist Chair/bed transfer assistive device: Armrests,Walker   Locomotion Ambulation   Ambulation assist      Assist level: Contact Guard/Touching assist Assistive device: Walker-rolling Max distance: 164ft   Walk 10 feet activity   Assist  Walk 10 feet activity did not occur: Safety/medical concerns  Assist level: Supervision/Verbal cueing Assistive device: Walker-rolling   Walk 50 feet activity   Assist Walk 50 feet with 2 turns activity did not occur: Safety/medical concerns  Assist level: Supervision/Verbal cueing Assistive device: Walker-rolling    Walk 150 feet activity   Assist Walk 150 feet activity did not occur: Safety/medical concerns  Assist level:  Contact Guard/Touching assist Assistive device: Walker-rolling    Walk 10 feet on uneven surface  activity   Assist Walk 10 feet on uneven surfaces activity did not occur: Safety/medical concerns   Assist level: Contact Guard/Touching assist Assistive device: Photographer Will patient use wheelchair at discharge?: Yes Type of Wheelchair: Manual    Wheelchair assist level: Independent Max wheelchair distance: 373ft    Wheelchair 50 feet with 2 turns activity    Assist        Assist Level: Independent    Wheelchair 150 feet activity     Assist      Assist Level: Independent   Blood pressure (!) 125/91, pulse 100, temperature 98.9 F (37.2 C), temperature source Oral, resp. rate 18, height 5\' 9"  (1.753 m), weight 75.7 kg, SpO2 96 %.     Medical Problem List and Plan: 1.  Debility secondary to septic shock/left gluteal/groin abscesses/septic arthritis with history of avascular necrosis left hip  D/C today   2.  Impaired mobility: -DVT/anticoagulation: Continue SCDs.  Venous Doppler studies negative             -antiplatelet therapy: N/A 3. Pain Management: Continue MS Contin 15 mg every 12 hours, MSIR 15 mg every 6 hours as needed-using regularly.   Controlled with meds, d/c MS Contin, cont Oxy IR 5mg  q6h prn- only took 2 tabs yesterday , increase gabapentin to TID     4. Depression: Continue Zoloft 50 mg daily.  Provide emotional support             -antipsychotic agents: N/A 5. Neuropsych: This patient is capable of making decisions on his own behalf. 6. Skin/Wound Care: Routine skin checks 7. Fluids/Electrolytes/Nutrition: Routine in and outs 8.  AVN left hip.  Status post left total hip arthroplasty with antibiotic spacer left hip excisional debridement of skin subcutaneous tissue muscle bone deep abscess 09/15/2020 per Dr. .    Weightbearing as tolerated with posterior hip precautions 9.  Left groin and left hip abscess.  Status post multiple I&D's.  Wound VAC as directed-can likely be removed next week.- Gen Surg-Dr Cornett.  , drains removed with smaller fluid collections 10.  ID.  Continue ceftezole 2 g every 8 hours and Flagyl 500 mg every 8 hours and rifampin 300 mg every 12 hours through 10/28/2020 and stop.  Follow-up infectious disease Increased  With increased fluid collection , thin sheets along the vastus int and semimembranosus, afebrile, non tender but WBC are up await ortho rec,not accessible image guided aspiration Finish IVF today , afeb, last WBC nl 11.   Hepatic cirrhosis with ascites.  Status post multiple paracentesis.  GI recommends outpatient EGD.  Latest paracentesis 10/12/2020.  Continue Chronulac 10 mg twice daily.   Will need weekly paracentesis , f/u GI 12.  Scrotal edema.  Follow-up urology services Dr. 12/28/2020.    Voiding well  13.  Anemia/thrombocytopenia.    Hemoglobin 8.5 on 3/24, continue to monitor 14.  Hypertension.  Lopressor 50 mg twice daily, Aldactone 100 mg daily, Lasix 40 mg daily   Vitals:   10/27/20 1939 10/28/20 0446  BP: 116/82 (!) 125/91  Pulse: (!) 107 100  Resp: 18 18  Temp: 98.3 F (36.8 C) 98.9 F (37.2 C)  SpO2: 98% 96%   15.  Tobacco abuse.  Counseling 16. Hyponatremia:   Sodium 129 on 3/25, 132 on 3/28, monitor trend, asymptomatic likely diuretic related  17. Hypoalbuminemia: 1.6, continue high protein  foods, recommended girlfriend bring in nuts, Still 1.4, + proteinuria  4/3-we will order albumin with his paracentesis 18.  Hypo K+ resolved with KCL po LOS: 18 days A FACE TO FACE EVALUATION WAS PERFORMED  Erick Colace 10/28/2020, 8:34 AM

## 2020-11-10 ENCOUNTER — Encounter: Payer: Self-pay | Admitting: Physician Assistant

## 2020-11-10 ENCOUNTER — Other Ambulatory Visit (INDEPENDENT_AMBULATORY_CARE_PROVIDER_SITE_OTHER): Payer: 59

## 2020-11-10 ENCOUNTER — Ambulatory Visit (INDEPENDENT_AMBULATORY_CARE_PROVIDER_SITE_OTHER): Payer: 59 | Admitting: Physician Assistant

## 2020-11-10 VITALS — BP 120/78 | HR 84 | Ht 70.0 in | Wt 143.5 lb

## 2020-11-10 DIAGNOSIS — R601 Generalized edema: Secondary | ICD-10-CM | POA: Diagnosis not present

## 2020-11-10 DIAGNOSIS — K7460 Unspecified cirrhosis of liver: Secondary | ICD-10-CM

## 2020-11-10 LAB — CBC WITH DIFFERENTIAL/PLATELET
Basophils Absolute: 0.1 10*3/uL (ref 0.0–0.1)
Basophils Relative: 0.8 % (ref 0.0–3.0)
Eosinophils Absolute: 0.4 10*3/uL (ref 0.0–0.7)
Eosinophils Relative: 3.5 % (ref 0.0–5.0)
HCT: 31.8 % — ABNORMAL LOW (ref 39.0–52.0)
Hemoglobin: 10.7 g/dL — ABNORMAL LOW (ref 13.0–17.0)
Lymphocytes Relative: 24 % (ref 12.0–46.0)
Lymphs Abs: 2.5 10*3/uL (ref 0.7–4.0)
MCHC: 33.5 g/dL (ref 30.0–36.0)
MCV: 94.6 fl (ref 78.0–100.0)
Monocytes Absolute: 0.8 10*3/uL (ref 0.1–1.0)
Monocytes Relative: 8 % (ref 3.0–12.0)
Neutro Abs: 6.7 10*3/uL (ref 1.4–7.7)
Neutrophils Relative %: 63.7 % (ref 43.0–77.0)
Platelets: 373 10*3/uL (ref 150.0–400.0)
RBC: 3.36 Mil/uL — ABNORMAL LOW (ref 4.22–5.81)
RDW: 14.9 % (ref 11.5–15.5)
WBC: 10.5 10*3/uL (ref 4.0–10.5)

## 2020-11-10 LAB — COMPREHENSIVE METABOLIC PANEL
ALT: 13 U/L (ref 0–53)
AST: 25 U/L (ref 0–37)
Albumin: 3.4 g/dL — ABNORMAL LOW (ref 3.5–5.2)
Alkaline Phosphatase: 159 U/L — ABNORMAL HIGH (ref 39–117)
BUN: 12 mg/dL (ref 6–23)
CO2: 26 mEq/L (ref 19–32)
Calcium: 10.6 mg/dL — ABNORMAL HIGH (ref 8.4–10.5)
Chloride: 98 mEq/L (ref 96–112)
Creatinine, Ser: 0.48 mg/dL (ref 0.40–1.50)
GFR: 132.52 mL/min (ref >60.00)
Glucose, Bld: 94 mg/dL (ref 70–99)
Potassium: 4.6 mEq/L (ref 3.5–5.1)
Sodium: 132 mEq/L — ABNORMAL LOW (ref 135–145)
Total Bilirubin: 0.4 mg/dL (ref 0.2–1.2)
Total Protein: 8.4 g/dL — ABNORMAL HIGH (ref 6.0–8.3)

## 2020-11-10 LAB — PROTIME-INR
INR: 1.3 ratio — ABNORMAL HIGH (ref 0.8–1.0)
Prothrombin Time: 14.8 s — ABNORMAL HIGH (ref 9.6–13.1)

## 2020-11-10 LAB — AMMONIA: Ammonia: 82 umol/L — ABNORMAL HIGH (ref 11–35)

## 2020-11-10 MED ORDER — LACTULOSE 10 G PO PACK: 30.0000 g | PACK | Freq: Every day | ORAL | 6 refills | Status: DC

## 2020-11-10 MED ORDER — SPIRONOLACTONE 50 MG PO TABS: 50.0000 mg | ORAL_TABLET | Freq: Every day | ORAL | 3 refills | Status: DC

## 2020-11-10 MED ORDER — PANTOPRAZOLE SODIUM 40 MG PO TBEC: 40.0000 mg | DELAYED_RELEASE_TABLET | Freq: Every day | ORAL | 3 refills | Status: DC

## 2020-11-10 MED ORDER — FUROSEMIDE 20 MG PO TABS: 20.0000 mg | ORAL_TABLET | Freq: Every day | ORAL | 3 refills | Status: DC

## 2020-11-10 NOTE — Patient Instructions (Addendum)
If you are age 37 or older, your body mass index should be between 23-30. Your Body mass index is 20.59 kg/m. If this is out of the aforementioned range listed, please consider follow up with your Primary Care Provider.  If you are age 23 or younger, your body mass index should be between 19-25. Your Body mass index is 20.59 kg/m. If this is out of the aformentioned range listed, please consider follow up with your Primary Care Provider.   Your provider has requested that you go to the basement level for lab work before leaving today. Press "B" on the elevator. The lab is located at the first door on the left as you exit the elevator.  You have been scheduled for an endoscopy. Please follow written instructions given to you at your visit today. If you use inhalers (even only as needed), please bring them with you on the day of your procedure.  Continue Lactulose 30 grams daily Decrease your Furosemide to 20 mg 1 tablet every morning Decrease Spironolactone to 50 mg 1 tablet every morning Decrease Pantoprazole 40 mg to 1 tablet once daily.  Continue on a Low Sodium Diet.  Weight yourself every other day.  You have been scheduled to follow up with Dr. Lavon Paganini on February 14, 2021 at 9:50 am  Thank you for entrusting me with your care and choosing [facility].  Amy Esterwood, PA-C

## 2020-11-11 ENCOUNTER — Encounter: Payer: Self-pay | Admitting: Physician Assistant

## 2020-11-11 LAB — AFP TUMOR MARKER: AFP-Tumor Marker: 6.9 ng/mL — ABNORMAL HIGH (ref <6.1)

## 2020-11-11 NOTE — Progress Notes (Signed)
Subjective:    Patient ID: [name], male    DOB: October 08, 1985, 35 y.o.   MRN: [id number]  HPI [name] is a 35 year old white male, new to the office today who comes in for post hospital follow-up after 2 recent hospitalizations.  He saw Dr. Silverio Decamp  while hospitalized in March 2022.  Patient has been diagnosed with decompensated alcohol induced cirrhosis, EtOH hepatitis, He was initially admitted January 2022 with COVID-19, and diagnosed with alcoholic hepatitis at that same time.  He did meet criteria for steroids and was started on a course of prednisolone.  He had been seen by [facility] during that admission. He was readmitted 09/13/2020 with complaints of generalized fatigue and lower extremity pain, found to have a left iliopsoas and left gluteal abscess and left hip effusion.  Subsequent diagnosis of disseminated MSSA  infection and has required several surgeries and I&D's.  He then began developing anasarca with left scrotal edema and lower extremity edema. CT imaging 09/22/2020 showed a cirrhotic appearing liver, mild splenomegaly and moderate to large amount of ascites. His LFTs normalized for the most part during the March admission. He did undergo paracenteses on 09/30/2020 with removal of 3 L, no evidence of SBP.  His last paracentesis was done on 10/24/2020 with removal of 3.8 L. He had follow-up CT imaging on 10/19/2020 which again showed a cirrhotic appearing liver, moderate ascites and the drainage catheter in the left psoas had been removed. He eventually was discharged and has just completed an inpatient rehab stay on 10/28/2020.  He has been quite debilitated by the left hip and gluteal infections.  He says he has been improving since discharge to home and has been working with physical therapy at home.  He is able to ambulate with a walker. He was discharged on Lasix 40 mg daily and Aldactone 100 mg daily as well as Protonix.  Both he and his fiance states that he is had a  significant weight loss and no longer has any fluid in his abdomen or in his legs just over the past week.  They think he is down about 20 pounds over the past 2 and half weeks since he has been out of the hospital.  He has run out of lactulose and is asking whether he needs to continue on this. He has been able to maintain alcohol abstinence. Most recent labs 10/24/2020-WBC 9.9/hemoglobin 8.6/hematocrit 27.3/platelets 267 Potassium 3.3/creatinine 0.3/albumin 1.5 LFTs normal with the exception of alk phos at 135 Last INR 1.4  MELD =1  Review of Systems Pertinent positive and negative review of systems were noted in the above HPI section.  All other review of systems was otherwise negative.  Outpatient Encounter Medications as of 11/10/2020  Medication Sig  . acetaminophen (TYLENOL) 325 MG tablet Take 2 tablets (650 mg total) by mouth every 6 (six) hours as needed for mild pain (or Fever >/= 101).  . folic acid (FOLVITE) 1 MG tablet Take 1 tablet (1 mg total) by mouth daily.  Marland Kitchen gabapentin (NEURONTIN) 600 MG tablet Take 1/2 tablet (300 mg total) by mouth 2 (two) times daily.  Marland Kitchen lactulose (CEPHULAC) 10 g packet Take 3 packets (30 g total) by mouth daily.  . metoprolol tartrate (LOPRESSOR) 50 MG tablet Take 1 tablet (50 mg total) by mouth 2 (two) times daily.  . Multiple Vitamin (MULTIVITAMIN WITH MINERALS) TABS tablet Take 1 tablet by mouth daily.  . potassium chloride (KLOR-CON) 10 MEQ tablet Take 1 tablet (10 mEq  total) by mouth daily.  . sertraline (ZOLOFT) 50 MG tablet Take 1 tablet (50 mg total) by mouth daily.  . [DISCONTINUED] furosemide (LASIX) 40 MG tablet Take 1 tablet (40 mg total) by mouth daily.  . [DISCONTINUED] pantoprazole (PROTONIX) 40 MG tablet Take 1 tablet (40 mg total) by mouth 2 (two) times daily.  . [DISCONTINUED] spironolactone (ALDACTONE) 100 MG tablet Take 1 tablet (100 mg total) by mouth daily.  . furosemide (LASIX) 20 MG tablet Take 1 tablet (20 mg total) by mouth daily.   . pantoprazole (PROTONIX) 40 MG tablet Take 1 tablet (40 mg total) by mouth daily.  Marland Kitchen spironolactone (ALDACTONE) 50 MG tablet Take 1 tablet (50 mg total) by mouth daily.  . [DISCONTINUED] docusate sodium (COLACE) 100 MG capsule Take 1 capsule (100 mg total) by mouth 2 (two) times daily.  . [DISCONTINUED] lactulose, encephalopathy, (CHRONULAC) 10 GM/15ML SOLN Take 15 mLs (10 g total) by mouth 2 (two) times daily.  . [DISCONTINUED] oxyCODONE (OXY IR/ROXICODONE) 5 MG immediate release tablet Take 1 tablet (5 mg total) by mouth every 6 (six) hours as needed for breakthrough pain.  . [DISCONTINUED] polyethylene glycol (MIRALAX / GLYCOLAX) 17 g packet Take 17 g by mouth daily as needed for moderate constipation.   No facility-administered encounter medications on file as of 11/10/2020.   No Active Allergies Patient Active Problem List   Diagnosis Date Noted  . S/P hip replacement, left   . Ascites   . Hyponatremia   . Essential hypertension   . AVN (avascular necrosis of bone) ([place])   . Abscess of hip, left   . Septic shock ([place]) 10/10/2020  . Debility 10/10/2020  . Ascites due to alcoholic hepatitis   . Portal hypertension ([place])   . Alcoholic cirrhosis of liver without ascites ([place])   . Septic arthritis ([place]) 09/14/2020  . MSSA bacteremia 09/14/2020  . Iliopsoas abscess on left ([place]) 09/14/2020  . Sepsis ([place]) 09/13/2020  . ARF (acute renal failure) ([place]) 08/22/2020  . Thrombocytopenia ([place]) 08/22/2020  . Alcoholic hepatitis 81/44/8185  . Hypertension 08/22/2020  . COVID-19 virus infection 08/22/2020  . Alcohol abuse   . MDD (major depressive disorder) 10/15/2013  . Alcohol dependence ([place]) 04/02/2013  . Social anxiety disorder 04/02/2013  . ALLERGIC RHINITIS 03/28/2007   Social History   Socioeconomic History  . Marital status: Single    Spouse name: Not on file  . Number of children: Not on file  . Years of education: Not on file  . Highest education level: Not on file   Occupational History  . Not on file  Tobacco Use  . Smoking status: Former Smoker    Types: Cigars  . Smokeless tobacco: Never Used  Substance and Sexual Activity  . Alcohol use: Not Currently    Alcohol/week: 12.0 - 18.0 standard drinks    Types: 12 - 18 Cans of beer per week    Comment: 90 days without alcohol as of 11/17/20  . Drug use: Never  . Sexual activity: Yes    Birth control/protection: None  Other Topics Concern  . Not on file  Social History Narrative  . Not on file   Social Determinants of Health   Financial Resource Strain: Not on file  Food Insecurity: Not on file  Transportation Needs: Not on file  Physical Activity: Not on file  Stress: Not on file  Social Connections: Not on file  Intimate Partner Violence: Not on file    Mr. Voth family history is  not on file.      Objective:    Vitals:   11/10/20 1412  BP: 120/78  Pulse: 84    Physical Exam Well-developed well-nourished young white male in no acute distress..  Patient is in a wheelchair and accompanied by his fiance  height, Weight 143, BMI 20.5  HEENT; nontraumatic normocephalic, EOMI, PE R LA, sclera anicteric. Oropharynx; not examined today Neck; supple, no JVD Cardiovascular; regular rate and rhythm with S1-S2, no murmur rub or gallop Pulmonary; Clear bilaterally Abdomen; soft, nontender, nondistended, loose skin with striae along the lower abdomen, no appreciable fluid wave no significant ascites no palpable mass or hepatosplenomegaly, bowel sounds are active Rectal; not done today Skin; benign exam, no jaundice rash or appreciable lesions Extremities; no clubbing cyanosis or edema skin warm and dry Neuro/Psych; alert and oriented x4, grossly nonfocal mood and affect appropriate, no asterixis       Assessment & Plan:   #19 35 year old white male with resolved alcoholic hepatitis initially diagnosed January 2022 when he was also hospitalized with COVID-19 He did receive a  short course of prednisolone  #2 EtOH induced cirrhosis-he has had significant improvement provement in parameters over the last couple of months, most recent meld calculated at 1. He does have CT evidence of cirrhosis. Anasarca has resolved  #3 surveillance for esophageal varices-no prior EGD #4 recent history of MSSA  bacteremia status post multiple drainage procedures and surgeries on the left hip and gluteal area Significant debilitation secondary to above-currently working with physical therapy and able to ambulate with a walker Will eventually need a hip revision  Plan; repeat labs today to include CBC, c-Met, INR, venous ammonia/AFP Patient will be scheduled for upper endoscopy with Dr. Silverio Decamp to screen for varices.  Procedure was discussed in detail with patient including indications risk and benefits and he is agreeable to proceed.  Continue 2 g sodium diet Decrease Lasix to 20 mg p.o. every morning Decrease spironolactone to 50 mg 1 p.o. every morning Continue Protonix 40 mg but decrease to once daily every morning Continue complete alcohol abstinence forever, discussed with patient again today  They are asked to notify us if he has any significant weight gain over the next few weeks. Resume lactulose 30 cc once daily Office follow-up in 4 to 5 weeks with Dr. Silverio Decamp  or myself  Caleb Decock Genia Harold PA-C 11/11/2020   Cc: [name], [place]

## 2020-11-14 ENCOUNTER — Telehealth: Payer: Self-pay | Admitting: Physician Assistant

## 2020-11-14 NOTE — Progress Notes (Signed)
Reviewed and agree with documentation and assessment and plan. K. Veena Travonta Gill , MD   

## 2020-11-14 NOTE — Telephone Encounter (Signed)
Called back. No answer. Left as message of my returned call.

## 2020-11-14 NOTE — Telephone Encounter (Signed)
Patients girlfriend called to get advise on some IBS medications and to get lab results.

## 2020-11-15 ENCOUNTER — Other Ambulatory Visit (HOSPITAL_COMMUNITY): Payer: Self-pay

## 2020-11-15 ENCOUNTER — Other Ambulatory Visit: Payer: Self-pay

## 2020-11-15 MED ORDER — LACTULOSE 10 GM/15ML PO SOLN: 30.0000 g | Freq: Every day | ORAL | 3 refills | Status: DC

## 2020-11-15 NOTE — Telephone Encounter (Signed)
Can the patient take NSAIDs. Ortho want to know.

## 2020-11-16 NOTE — Telephone Encounter (Signed)
Patient is taking Tylenol for the ortho issues. Wants to know if he can take NSAIDs

## 2020-11-16 NOTE — Telephone Encounter (Signed)
Called patient to remind him of his appointment still waiting for call back regarding medications that have been changed and if this will effect procedure.  Please advise.

## 2020-11-16 NOTE — Telephone Encounter (Signed)
Left this information on the voicemail of [name].

## 2020-11-21 ENCOUNTER — Ambulatory Visit (AMBULATORY_SURGERY_CENTER): Payer: 59 | Admitting: Gastroenterology

## 2020-11-21 ENCOUNTER — Encounter: Payer: Self-pay | Admitting: Gastroenterology

## 2020-11-21 ENCOUNTER — Other Ambulatory Visit: Payer: Self-pay | Admitting: Gastroenterology

## 2020-11-21 ENCOUNTER — Other Ambulatory Visit: Payer: Self-pay

## 2020-11-21 VITALS — BP 120/81 | HR 75 | Temp 97.9°F | Resp 15 | Ht 70.0 in | Wt 143.0 lb

## 2020-11-21 DIAGNOSIS — K2970 Gastritis, unspecified, without bleeding: Secondary | ICD-10-CM

## 2020-11-21 DIAGNOSIS — K7460 Unspecified cirrhosis of liver: Secondary | ICD-10-CM

## 2020-11-21 DIAGNOSIS — R601 Generalized edema: Secondary | ICD-10-CM | POA: Diagnosis not present

## 2020-11-21 DIAGNOSIS — K319 Disease of stomach and duodenum, unspecified: Secondary | ICD-10-CM

## 2020-11-21 MED ORDER — SODIUM CHLORIDE 0.9 % IV SOLN: 500.0000 mL | Freq: Once | INTRAVENOUS | Status: DC

## 2020-11-21 NOTE — Patient Instructions (Signed)
Handout given;  Gastritis Resume previous diet Continue current medications Await pathology results  YOU HAD AN ENDOSCOPIC PROCEDURE TODAY AT THE [facility]:   Refer to the procedure report that was given to you for any specific questions about what was found during the examination.  If the procedure report does not answer your questions, please call your gastroenterologist to clarify.  If you requested that your care partner not be given the details of your procedure findings, then the procedure report has been included in a sealed envelope for you to review at your convenience later.  YOU SHOULD EXPECT: Some feelings of bloating in the abdomen. Passage of more gas than usual.  Walking can help get rid of the air that was put into your GI tract during the procedure and reduce the bloating. If you had a lower endoscopy (such as a colonoscopy or flexible sigmoidoscopy) you may notice spotting of blood in your stool or on the toilet paper. If you underwent a bowel prep for your procedure, you may not have a normal bowel movement for a few days.  Please Note:  You might notice some irritation and congestion in your nose or some drainage.  This is from the oxygen used during your procedure.  There is no need for concern and it should clear up in a day or so.  SYMPTOMS TO REPORT IMMEDIATELY:  Following upper endoscopy (EGD)  Vomiting of blood or coffee ground material  New chest pain or pain under the shoulder blades  Painful or persistently difficult swallowing  New shortness of breath  Fever of 100F or higher  Black, tarry-looking stools  For urgent or emergent issues, a gastroenterologist can be reached at any hour by calling [phone]. Do not use MyChart messaging for urgent concerns.   DIET:  We do recommend a small meal at first, but then you may proceed to your regular diet.  Drink plenty of fluids but you should avoid alcoholic beverages for 24 hours.  ACTIVITY:  You  should plan to take it easy for the rest of today and you should NOT DRIVE or use heavy machinery until tomorrow (because of the sedation medicines used during the test).    FOLLOW UP: Our staff will call the number listed on your records 48-72 hours following your procedure to check on you and address any questions or concerns that you may have regarding the information given to you following your procedure. If we do not reach you, we will leave a message.  We will attempt to reach you two times.  During this call, we will ask if you have developed any symptoms of COVID 19. If you develop any symptoms (ie: fever, flu-like symptoms, shortness of breath, cough etc.) before then, please call [phone].  If you test positive for Covid 19 in the 2 weeks post procedure, please call and report this information to [place].    If any biopsies were taken you will be contacted by phone or by letter within the next 1-3 weeks.  Please call us at [phone] if you have not heard about the biopsies in 3 weeks.   SIGNATURES/CONFIDENTIALITY: You and/or your care partner have signed paperwork which will be entered into your electronic medical record.  These signatures attest to the fact that that the information above on your After Visit Summary has been reviewed and is understood.  Full responsibility of the confidentiality of this discharge information lies with you and/or your care-partner.

## 2020-11-21 NOTE — Progress Notes (Signed)
Report given to PACU, vss 

## 2020-11-21 NOTE — Progress Notes (Signed)
VS by CW  Pt's states no medical or surgical changes since previsit or office visit.  

## 2020-11-21 NOTE — Op Note (Signed)
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 The patient's tolerance of                            previous anesthesia was also reviewed. The risks                            and benefits of the procedure and the sedation                            options and risks were discussed with the patient.                            All questions were answered, and informed consent                            was obtained. Prior Anticoagulants: The patient has                            taken no previous anticoagulant or antiplatelet                            agents. ASA Grade Assessment: III - A patient with                            severe systemic disease. After reviewing the risks  and benefits, the patient was deemed in                            satisfactory condition to undergo the procedure.                           After obtaining informed consent, the endoscope was                            passed under direct vision. Throughout the                            procedure, the patient's blood pressure, pulse, and                            oxygen saturations were monitored continuously.  The                            Endoscope was introduced through the mouth, and                            advanced to the second part of duodenum. The upper                            GI endoscopy was accomplished without difficulty.                            The patient tolerated the procedure well. Scope In: Scope Out: Findings:                 The Z-line was regular and was found 38 cm from the                            incisors.                           No gross lesions were noted in the entire                            esophagus. No esophageal varices.                           Patchy mild inflammation characterized by                            congestion (edema), erosions and erythema was found                            in the gastric antrum and in the prepyloric region                            of the stomach. Biopsies were taken with a cold                            forceps  for Helicobacter pylori testing.                           No gastric varices                           The examined duodenum was normal. Complications:            No immediate complications. Estimated Blood Loss:     Estimated blood loss was minimal. Impression:               - Z-line regular, 38 cm from the incisors.                           - No gross lesions in esophagus.                           - Gastritis. Biopsied.                           - Normal examined duodenum. Recommendation:           - Patient has a contact number available for                            emergencies. The signs and symptoms of potential                            delayed complications were discussed with the                            patient. Return to normal activities tomorrow.                            Written discharge instructions were provided to the                            patient.                           - Resume previous diet.                           - Continue present medications.                            - Await pathology results.                           - Return to GI office in 3 months for follow up                            with APP or Dr.Maureena Dabbs. Please call to schedule                            appointment. Napoleon Form, MD 11/21/2020 2:01:03 PM This report has been signed electronically.

## 2020-11-21 NOTE — Progress Notes (Signed)
1330 Robinul 0.1 mg IV given due large amount of secretions upon assessment.  MD made aware, vss  °

## 2020-11-21 NOTE — Progress Notes (Signed)
Called to room to assist during endoscopic procedure.  Patient ID and intended procedure confirmed with present staff. Received instructions for my participation in the procedure from the performing physician.  

## 2020-11-23 ENCOUNTER — Telehealth: Payer: Self-pay

## 2020-11-23 NOTE — Telephone Encounter (Signed)
LVM

## 2020-12-07 HISTORY — PX: REMOVAL OF CEMENTED SPACER HIP: SHX6055

## 2020-12-09 ENCOUNTER — Encounter: Payer: Self-pay | Admitting: Gastroenterology

## 2021-01-18 DIAGNOSIS — M86652 Other chronic osteomyelitis, left thigh: Secondary | ICD-10-CM | POA: Insufficient documentation

## 2021-02-14 ENCOUNTER — Other Ambulatory Visit (INDEPENDENT_AMBULATORY_CARE_PROVIDER_SITE_OTHER): Payer: 59

## 2021-02-14 ENCOUNTER — Encounter: Payer: Self-pay | Admitting: Gastroenterology

## 2021-02-14 ENCOUNTER — Ambulatory Visit (INDEPENDENT_AMBULATORY_CARE_PROVIDER_SITE_OTHER): Payer: 59 | Admitting: Gastroenterology

## 2021-02-14 VITALS — BP 90/68 | HR 64 | Ht 68.0 in | Wt 153.4 lb

## 2021-02-14 DIAGNOSIS — K7031 Alcoholic cirrhosis of liver with ascites: Secondary | ICD-10-CM

## 2021-02-14 DIAGNOSIS — M8668 Other chronic osteomyelitis, other site: Secondary | ICD-10-CM | POA: Diagnosis not present

## 2021-02-14 LAB — COMPREHENSIVE METABOLIC PANEL
ALT: 27 U/L (ref 0–53)
AST: 31 U/L (ref 0–37)
Albumin: 4.3 g/dL (ref 3.5–5.2)
Alkaline Phosphatase: 165 U/L — ABNORMAL HIGH (ref 39–117)
BUN: 9 mg/dL (ref 6–23)
CO2: 29 mEq/L (ref 19–32)
Calcium: 10.5 mg/dL (ref 8.4–10.5)
Chloride: 99 mEq/L (ref 96–112)
Creatinine, Ser: 0.63 mg/dL (ref 0.40–1.50)
GFR: 121.85 mL/min (ref >60.00)
Glucose, Bld: 92 mg/dL (ref 70–99)
Potassium: 4.1 mEq/L (ref 3.5–5.1)
Sodium: 137 mEq/L (ref 135–145)
Total Bilirubin: 0.3 mg/dL (ref 0.2–1.2)
Total Protein: 8.4 g/dL — ABNORMAL HIGH (ref 6.0–8.3)

## 2021-02-14 LAB — CBC WITH DIFFERENTIAL/PLATELET
Basophils Absolute: 0 10*3/uL (ref 0.0–0.1)
Basophils Relative: 0.4 % (ref 0.0–3.0)
Eosinophils Absolute: 0.2 10*3/uL (ref 0.0–0.7)
Eosinophils Relative: 2.3 % (ref 0.0–5.0)
HCT: 39.5 % (ref 39.0–52.0)
Hemoglobin: 13.1 g/dL (ref 13.0–17.0)
Lymphocytes Relative: 26 % (ref 12.0–46.0)
Lymphs Abs: 2.2 10*3/uL (ref 0.7–4.0)
MCHC: 33.2 g/dL (ref 30.0–36.0)
MCV: 89.4 fl (ref 78.0–100.0)
Monocytes Absolute: 0.7 10*3/uL (ref 0.1–1.0)
Monocytes Relative: 8.3 % (ref 3.0–12.0)
Neutro Abs: 5.2 10*3/uL (ref 1.4–7.7)
Neutrophils Relative %: 63 % (ref 43.0–77.0)
Platelets: 293 10*3/uL (ref 150.0–400.0)
RBC: 4.42 Mil/uL (ref 4.22–5.81)
RDW: 13.6 % (ref 11.5–15.5)
WBC: 8.3 10*3/uL (ref 4.0–10.5)

## 2021-02-14 LAB — IBC + FERRITIN
Ferritin: 213.7 ng/mL (ref 22.0–322.0)
Iron: 52 ug/dL (ref 42–165)
Saturation Ratios: 14.2 % — ABNORMAL LOW (ref 20.0–50.0)
Transferrin: 261 mg/dL (ref 212.0–360.0)

## 2021-02-14 LAB — VITAMIN B12: Vitamin B-12: 379 pg/mL (ref 211–911)

## 2021-02-14 LAB — PROTIME-INR
INR: 1.2 ratio — ABNORMAL HIGH (ref 0.8–1.0)
Prothrombin Time: 13.1 s (ref 9.6–13.1)

## 2021-02-14 LAB — FOLATE: Folate: >24.4 ng/mL (ref >5.9)

## 2021-02-14 MED ORDER — FUROSEMIDE 20 MG PO TABS: 20.0000 mg | ORAL_TABLET | Freq: Every day | ORAL | 3 refills | Status: AC

## 2021-02-14 NOTE — Patient Instructions (Signed)
Your provider has requested that you go to the basement level for lab work before leaving today. Press "B" on the elevator. The lab is located at the first door on the left as you exit the elevator.   We will refill your medications today  Follow up in 3 months  You have been scheduled for an abdominal ultrasound/Liver Doppler at [facility] (1st floor of hospital) on 02/22/2021 at 10 am. Please arrive 15 minutes prior to your appointment for registration. Make certain not to have anything to eat or drink after midnight prior to your appointment. Should you need to reschedule your appointment, please contact radiology at [phone]. This test typically takes about 30 minutes to perform.   Due to recent changes in healthcare laws, you may see the results of your imaging and laboratory studies on MyChart before your provider has had a chance to review them.  We understand that in some cases there may be results that are confusing or concerning to you. Not all laboratory results come back in the same time frame and the provider may be waiting for multiple results in order to interpret others.  Please give [place] 48 hours in order for your provider to thoroughly review all the results before contacting the office for clarification of your results.    If you are age 25 or older, your body mass index should be between 23-30. Your Body mass index is 23.32 kg/m. If this is out of the aforementioned range listed, please consider follow up with your Primary Care Provider.  If you are age 69 or younger, your body mass index should be between 19-25. Your Body mass index is 23.32 kg/m. If this is out of the aformentioned range listed, please consider follow up with your Primary Care Provider.   __________________________________________________________  The [facility] providers would like to encourage you to use [facility] to communicate with providers for non-urgent requests or questions.  Due to long hold  times on the telephone, sending your provider a message by [facility] may be a faster and more efficient way to get a response.  Please allow 48 business hours for a response.  Please remember that this is for non-urgent requests.    I appreciate the  opportunity to care for you  Thank You   Marsa Aris , MD

## 2021-02-14 NOTE — Progress Notes (Signed)
[name]    [id number]    1986-05-29  Primary Care Physician:Howley, Cristie Hem, PA  Referring Physician: [name], PA [address] [place],  [place] [address]   Chief complaint:  ETOH cirrhosis  HPI: 35 year old very pleasant male with history of alcoholic cirrhosis decompensated by ascites. Hospitalized in February with MSSA bacteremia abscesses in left thigh, pelvis and hip.  He had multiple I&D and was on prolonged course of antibiotics  Continues to abstain from alcohol, last EtOH use was 6 months ago  Overall he is doing better. Denies any nausea, vomiting, abdominal pain, melena or bright red blood per rectum   EGD 11/21/20 - The Z-line was regular and was found 38 cm from the incisors. - No gross lesions were noted in the entire esophagus. No esophageal varices. - Patchy mild inflammation characterized by congestion (edema), erosions and erythema was found in the gastric antrum and in the prepyloric region of the stomach. Biopsies were taken with a cold forceps for Helicobacter pylori testing. No gastric varices - The examined duodenum was normal.  CT abd & pelvis with contrast 10/19/20: Minimal bibasilar subsegmental atelectasis.   Hepatic cirrhosis is noted.  Mild to moderate ascites is noted.   Percutaneous drainage catheter that was noted in left psoas muscle on prior exam has been removed. No significant residual fluid collection is noted.   Fluid collections noted on prior exam within the left iliacus muscle as well as in the musculature surrounding the left hip are either resolved or significantly smaller compared to prior exam.  Outpatient Encounter Medications as of 02/14/2021  Medication Sig   acetaminophen (TYLENOL) 325 MG tablet Take 2 tablets (650 mg total) by mouth every 6 (six) hours as needed for mild pain (or Fever >/= 101).   cefadroxil (DURICEF) 500 MG capsule Take 2 capsules by mouth 2 (two)  times daily.   cetirizine (ZYRTEC) 10 MG tablet Take by mouth.   folic acid (FOLVITE) 1 MG tablet Take 1 tablet (1 mg total) by mouth daily.   furosemide (LASIX) 20 MG tablet Take 1 tablet (20 mg total) by mouth daily.   metoprolol tartrate (LOPRESSOR) 50 MG tablet Take 1 tablet (50 mg total) by mouth 2 (two) times daily.   Multiple Vitamin (MULTIVITAMIN WITH MINERALS) TABS tablet Take 1 tablet by mouth daily.   pantoprazole (PROTONIX) 40 MG tablet Take 1 tablet (40 mg total) by mouth daily.   sertraline (ZOLOFT) 50 MG tablet Take 1 tablet (50 mg total) by mouth daily.   spironolactone (ALDACTONE) 50 MG tablet Take 1 tablet (50 mg total) by mouth daily.   triamcinolone ointment (KENALOG) 0.5 % Apply topically 2 (two) times daily.   [DISCONTINUED] gabapentin (NEURONTIN) 600 MG tablet Take 1/2 tablet (300 mg total) by mouth 2 (two) times daily.   [DISCONTINUED] lactulose (CHRONULAC) 10 GM/15ML solution Take 45 mLs (30 g total) by mouth daily.   [DISCONTINUED] potassium chloride (KLOR-CON) 10 MEQ tablet Take 1 tablet (10 mEq total) by mouth daily.   No facility-administered encounter medications on file as of 02/14/2021.    Allergies as of 02/14/2021   (No Known Allergies)    Past Medical History:  Diagnosis Date   Alcohol abuse    Asthma    Cirrhosis (HCC)    Depression    Hypertension    Osteomyelitis (HCC)     Past Surgical History:  Procedure Laterality Date   BUBBLE STUDY  09/19/2020   Procedure: BUBBLE STUDY;  Surgeon: Pricilla Riffle, MD;  Location: [facility];  Service: Cardiovascular;;   I & D EXTREMITY Left 09/20/2020   Procedure: IRRIGATION AND DEBRIDEMENT EXTREMITY LEFT LEG ABCESS;  Surgeon: Teryl Lucy, MD;  Location: [facility];  Service: Orthopedics;  Laterality: Left;   I & D EXTREMITY Left 09/27/2020   Procedure: IRRIGATION AND DEBRIDEMENT LEFT THIGH WITH WOUND  VAC APPLICATION;  Surgeon: Teryl Lucy, MD;  Location: [facility];  Service: Orthopedics;  Laterality:  Left;   INCISION AND DRAINAGE OF WOUND Left 09/27/2020   Procedure: IRRIGATION AND DEBRIDEMENT LEFT GROIN;  Surgeon: Luretha Murphy, MD;  Location: [facility];  Service: General;  Laterality: Left;   IR PARACENTESIS  10/12/2020   IR PARACENTESIS  10/17/2020   IR PARACENTESIS  10/24/2020   IRRIGATION AND DEBRIDEMENT ABSCESS Left 09/22/2020   Procedure: IRRIGATION AND DEBRIDEMENT LEFT THIGH ABSCESS;  Surgeon: Harriette Bouillon, MD;  Location: [facility];  Service: General;  Laterality: Left;  DOW ROOM STARTING AT 09:00AM FOR 60 MIN   REMOVAL OF CEMENTED SPACER HIP Left 12/07/2020   TEE WITHOUT CARDIOVERSION N/A 09/19/2020   Procedure: TRANSESOPHAGEAL ECHOCARDIOGRAM (TEE);  Surgeon: Pricilla Riffle, MD;  Location: [facility] END[facility]COPY;  Service: Cardiovascular;  Laterality: N/A;   TOTAL HIP ARTHROPLASTY Left 09/15/2020   Procedure: TOTAL HIP ARTHROPLASTY WITH CEMENT INTERPOSITION SPACING;  Surgeon: Teryl Lucy, MD;  Location: [facility];  Service: Orthopedics;  Laterality: Left;    Family History  Problem Relation Age of Onset   Cirrhosis Neg Hx    Diabetes Mellitus II Neg Hx    Colon cancer Neg Hx    Esophageal cancer Neg Hx    Pancreatic cancer Neg Hx    Stomach cancer Neg Hx    Liver disease Neg Hx     Social History   Socioeconomic History   Marital status: Single    Spouse name: Not on file   Number of children: Not on file   Years of education: Not on file   Highest education level: Not on file  Occupational History   Not on file  Tobacco Use   Smoking status: Former    Types: Cigars   Smokeless tobacco: Never  Substance and Sexual Activity   Alcohol use: Not Currently    Alcohol/week: 12.0 - 18.0 standard drinks    Types: 12 - 18 Cans of beer per week    Comment: 90 days without alcohol as of 11/17/20   Drug use: Never   Sexual activity: Yes    Birth control/protection: None  Other Topics Concern   Not on file  Social History Narrative   Not on file   Social Determinants of Health    Financial Resource Strain: Not on file  Food Insecurity: Not on file  Transportation Needs: Not on file  Physical Activity: Not on file  Stress: Not on file  Social Connections: Not on file  Intimate Partner Violence: Not on file      Review of systems: All other review of systems negative except as mentioned in the HPI.   Physical Exam: Vitals:   02/14/21 0959  BP: 90/68  Pulse: 64   Body mass index is 23.32 kg/m. 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 The patient agreed with the plan and demonstrated an understanding of the instructions.  Iona Beard , MD    CC: [name], [name]

## 2021-02-15 LAB — AFP TUMOR MARKER: AFP-Tumor Marker: 3.2 ng/mL (ref <6.1)

## 2021-02-22 ENCOUNTER — Ambulatory Visit (HOSPITAL_COMMUNITY)
Admission: RE | Admit: 2021-02-22 | Discharge: 2021-02-22 | Disposition: A | Discharge status: Home / Self Care | Payer: 59 | Source: Ambulatory Visit | Attending: Gastroenterology | Admitting: Gastroenterology

## 2021-02-22 ENCOUNTER — Other Ambulatory Visit: Payer: Self-pay

## 2021-02-22 DIAGNOSIS — K7031 Alcoholic cirrhosis of liver with ascites: Secondary | ICD-10-CM

## 2021-03-14 ENCOUNTER — Other Ambulatory Visit: Payer: Self-pay | Admitting: Physician Assistant

## 2021-05-30 ENCOUNTER — Ambulatory Visit: Payer: 59 | Admitting: Gastroenterology

## 2021-06-19 ENCOUNTER — Ambulatory Visit: Payer: 59 | Admitting: Physician Assistant

## 2021-08-01 ENCOUNTER — Other Ambulatory Visit: Payer: Self-pay | Admitting: Gastroenterology

## 2021-08-05 ENCOUNTER — Other Ambulatory Visit: Payer: Self-pay | Admitting: Physician Assistant

## 2021-11-01 ENCOUNTER — Ambulatory Visit: Payer: Self-pay | Admitting: Family Medicine

## 2021-11-01 ENCOUNTER — Telehealth: Payer: Self-pay | Admitting: Physician Assistant

## 2021-11-01 NOTE — Telephone Encounter (Signed)
Patient/Caregiver was notified of No Show/Late Cancellation Policy & possible $50 charge. ?Visit was cancelled with reason "No Show/Cancel within 24 hours" for tracking & charging. ? ?Caller Name: [name]  ?Caller Ph #: [phone] ?Date of APPT: 11/01/21 ?Reason given for no show/late cancellation: no ?No Show Letter printed & put in outgoing mail (Yes/No): yes ? ?~~~Route message to admin supervisor and clinical team/CMA~~~ ? ?  ?

## 2021-11-01 NOTE — Telephone Encounter (Signed)
1st no show, fee waived ?

## 2021-11-09 ENCOUNTER — Other Ambulatory Visit: Payer: Self-pay | Admitting: Physician Assistant

## 2022-01-30 ENCOUNTER — Other Ambulatory Visit: Payer: Self-pay | Admitting: Physician Assistant

## 2023-01-21 IMAGING — CT CT FEMUR *L* W/ CM
3 of 6 series · 11 of 33 positions shown, 13 images · IV contrast (OMNIPAQUE)
Comparison: Pelvic CT 09/13/2020. Images during drainage procedure
09/14/2020.

CLINICAL DATA: Follow up left thigh abscesses following
percutaneous drain placement 4 days ago.

EXAM:
CT OF THE LOWER LEFT EXTREMITY WITH CONTRAST
TECHNIQUE: Multidetector CT imaging of the left thigh was performed according
to the standard protocol following intravenous contrast
administration.
CONTRAST:  100mL OMNIPAQUE IOHEXOL 300 MG/ML  SOLN

[Series 4: axial bone · axial · 0.50mm/px · z∈[+628,+1060]mm · 5 of 433 slices shown, 7 images]
[im 73/433  soft-tissue]
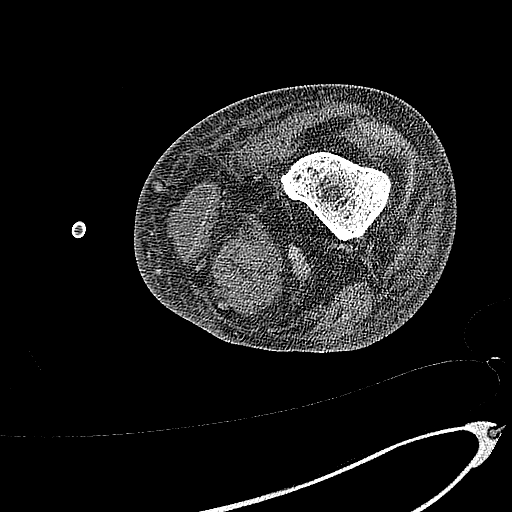
[im 73/433  bone]
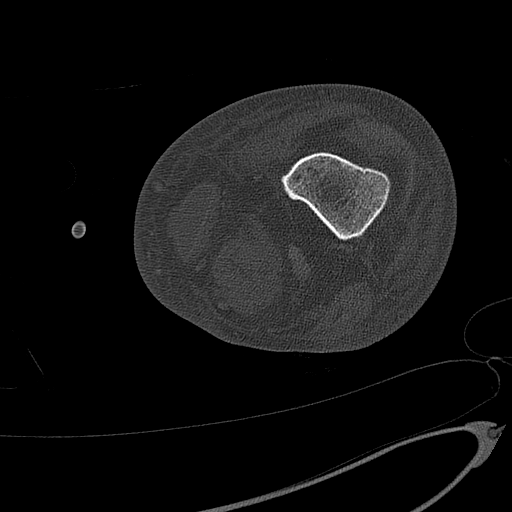
[im 145/433  bone]
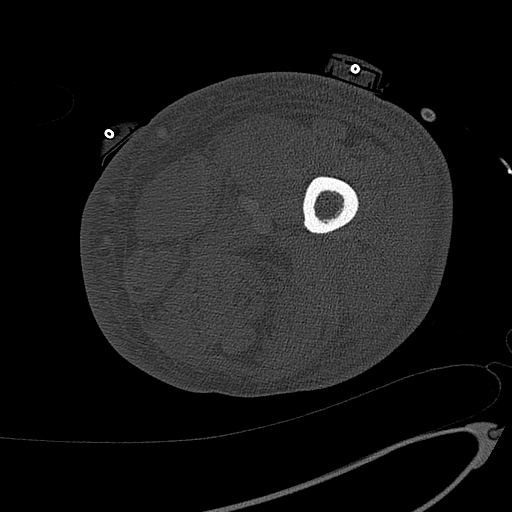
[im 217/433  bone]
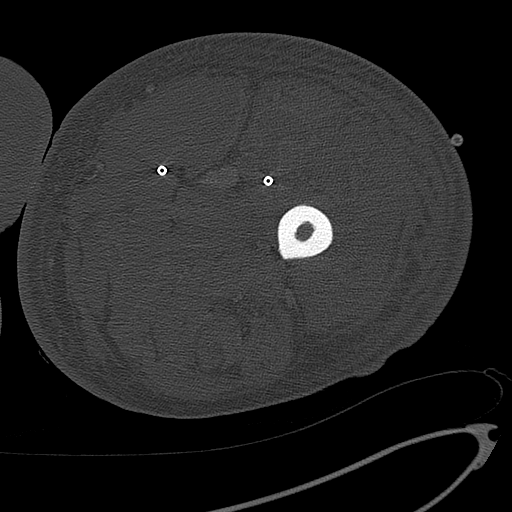
[im 289/433  bone]
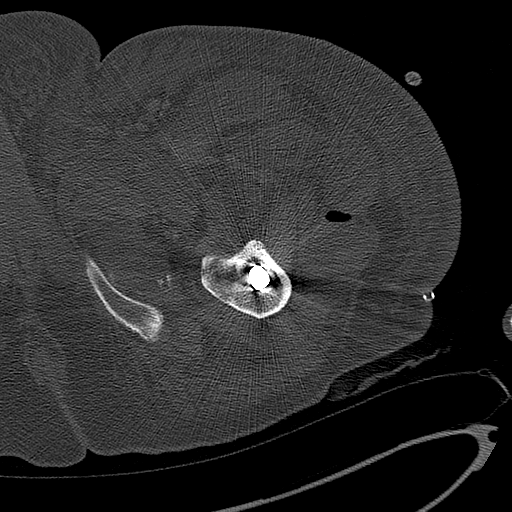
[im 361/433  soft-tissue]
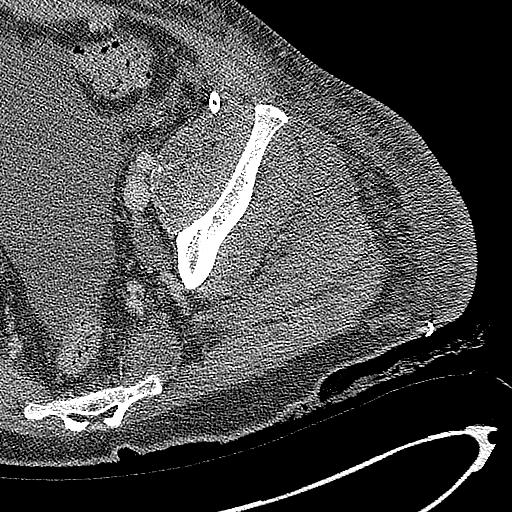
[im 361/433  bone]
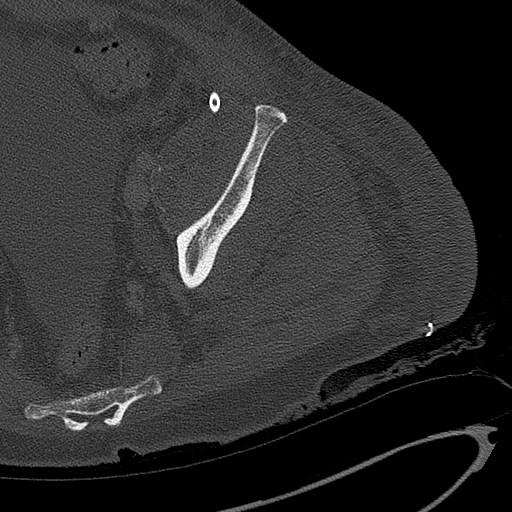

[Series 6: coronal bone · coronal · 0.79mm/px · 1 of 254 slices shown]
[im 127/254  bone]
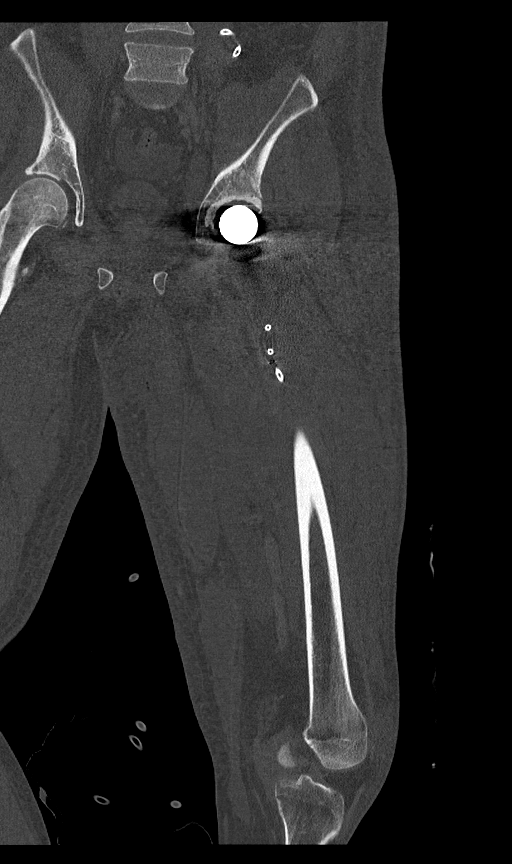

[Series 7: sagittal bone · sagittal · 0.74mm/px · 5 of 249 slices shown]
[im 42/249  bone]
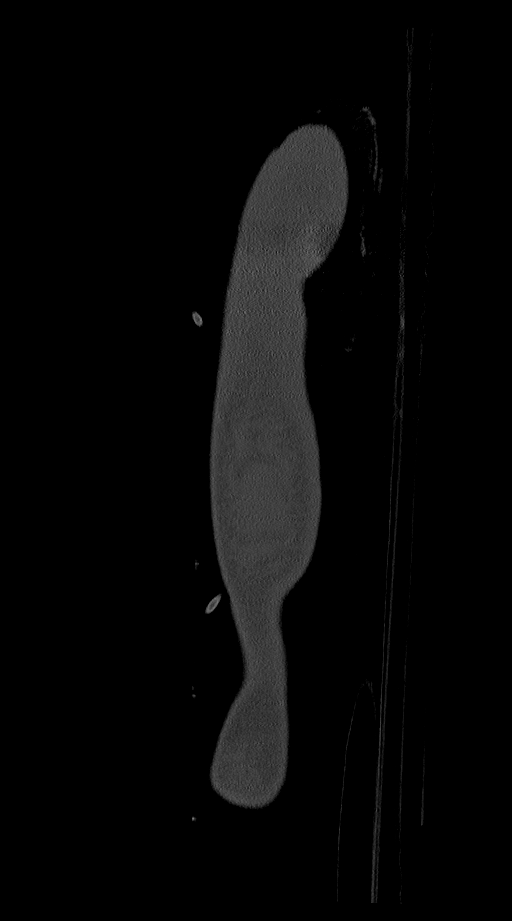
[im 83/249  bone]
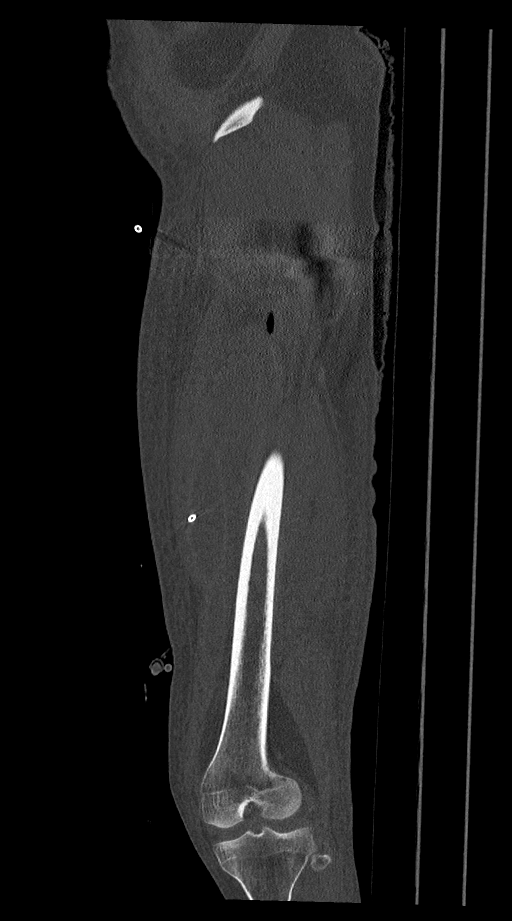
[im 125/249  bone]
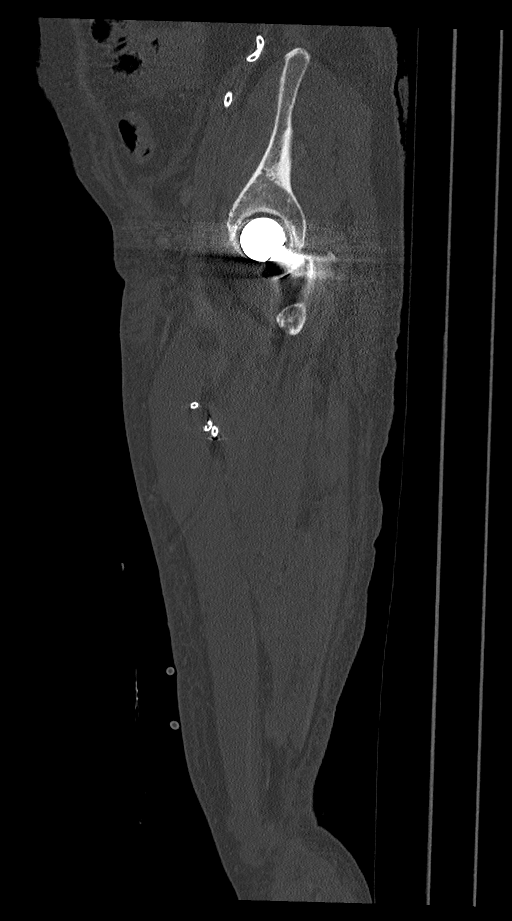
[im 166/249  bone]
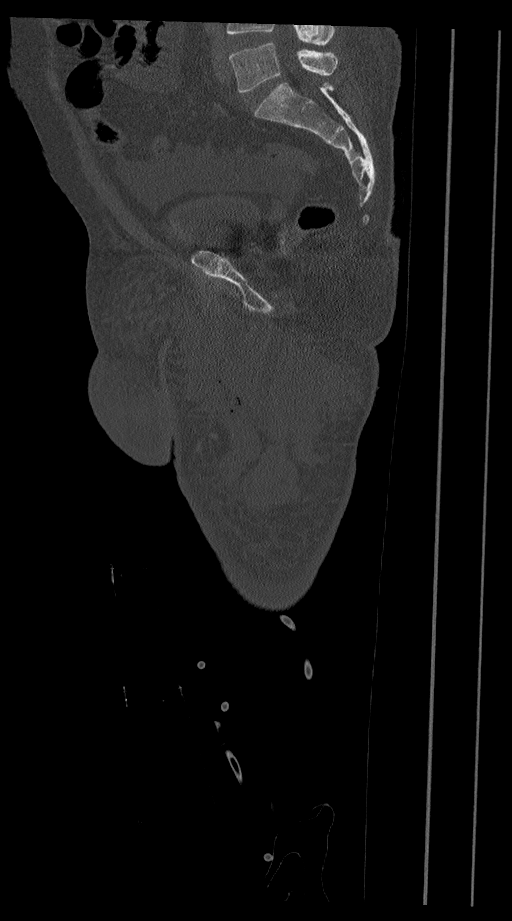
[im 207/249  bone]
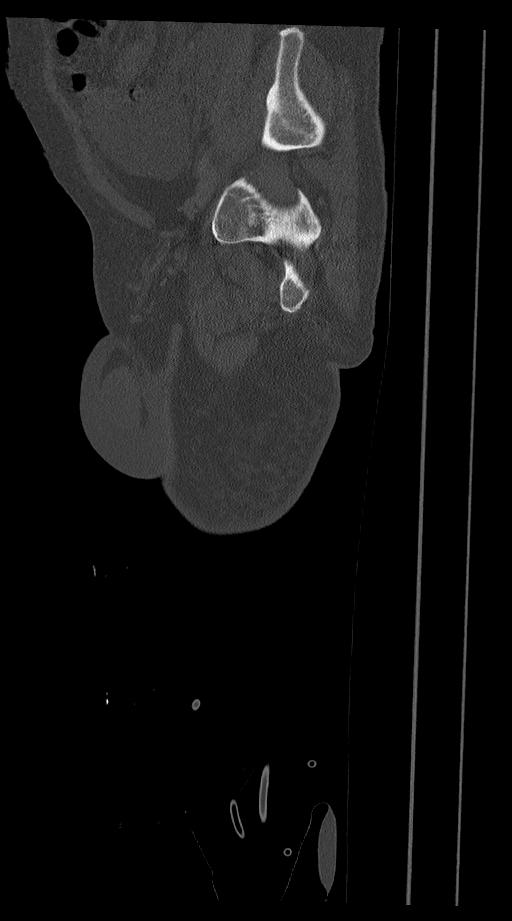

[11 of 33 positions shown; findings below may reference images not displayed]

FINDINGS: Bones/Joint/Cartilage

Interval left total hip arthroplasty. The hardware appears well
positioned. No evidence of acute fracture or dislocation. There is
no bone destruction. Stable mild degenerative changes of the left
sacroiliac joint. No large hip joint effusion.

Ligaments

Suboptimally assessed by CT.

Muscles and Tendons

Percutaneous drain within the left psoas muscle is unchanged in
position. There is no recurrent fluid collection in the psoas
muscle, although there is a residual peripherally enhancing iliacus
fluid collection measuring 2.4 cm transverse on image 52/5.
Peripherally enhancing fluid tracks inferiorly along the iliopsoas
tendon into the anterior aspect of the left thigh.

There are 2 percutaneous drains within the proximal to mid left
thigh. The more lateral drain is situated within a complex fluid
collection within the vastus lateralis muscle which measures up to
5.3 x 4.8 cm at the level of the drain. This collection is complex
with peripheral enhancement and multiple septations. It extends into
the distal thigh, at least 21 cm in length on sagittal image 165/9.
Some air within the collection is attributed to the drain.

The more posteromedial drain is located within a complex fluid
collection involving the proximal sartorius muscle. At the level of
the drain, this measures approximately 11.0 x 4.1 cm on image 203/5.
This fluid collection is also complex with peripheral enhancement.
It may communicate with a separate peripherally enhancing fluid
collection involving the right gracilis muscle. These medial
collections are also quite extensive in length, extending beyond the
knee, at least 40 cm in overall length (image 125/9).

Based on the images obtained through the left thigh after catheter
placement on 09/14/2020, the proximal component of the anteromedial
collection has mildly enlarged.

Soft tissues

There is generalized subcutaneous edema throughout the left thigh.
No unexpected foreign body. Pelvic findings are dictated separately.
IMPRESSION: 1. Interval placement of left pelvic and two left thigh drains for
multiple abscesses. The pelvic components of the fluid collections
have mildly improved, but there are multiple persistent large
peripherally enhancing complex fluid collections within the left
thigh which are incompletely drained. These are quite extensive in
length, extending into the distal thigh. The proximal component of
the anteromedial collection has slightly enlarged.
2. No evidence of osteomyelitis or large hip joint effusion.
3. Interval left total hip arthroplasty. The hardware appears well
positioned.
